# Patient Record
Sex: Female | Born: 1955 | Race: Black or African American | Hispanic: No | Marital: Married | Smoking: Never smoker
Health system: Southern US, Community
[De-identification: ages and names within clinical notes are randomized; demographics above are authoritative.]

## PROBLEM LIST (undated history)

## (undated) DIAGNOSIS — K219 Gastro-esophageal reflux disease without esophagitis: Secondary | ICD-10-CM

## (undated) DIAGNOSIS — R768 Other specified abnormal immunological findings in serum: Secondary | ICD-10-CM

## (undated) DIAGNOSIS — M1990 Unspecified osteoarthritis, unspecified site: Secondary | ICD-10-CM

## (undated) DIAGNOSIS — R7689 Other specified abnormal immunological findings in serum: Secondary | ICD-10-CM

## (undated) DIAGNOSIS — Z8659 Personal history of other mental and behavioral disorders: Secondary | ICD-10-CM

## (undated) DIAGNOSIS — G8929 Other chronic pain: Secondary | ICD-10-CM

## (undated) DIAGNOSIS — M797 Fibromyalgia: Secondary | ICD-10-CM

## (undated) DIAGNOSIS — Z86018 Personal history of other benign neoplasm: Secondary | ICD-10-CM

## (undated) DIAGNOSIS — R109 Unspecified abdominal pain: Secondary | ICD-10-CM

## (undated) DIAGNOSIS — R51 Headache: Secondary | ICD-10-CM

## (undated) DIAGNOSIS — F419 Anxiety disorder, unspecified: Secondary | ICD-10-CM

## (undated) DIAGNOSIS — F329 Major depressive disorder, single episode, unspecified: Secondary | ICD-10-CM

## (undated) DIAGNOSIS — R350 Frequency of micturition: Secondary | ICD-10-CM

## (undated) DIAGNOSIS — K589 Irritable bowel syndrome without diarrhea: Secondary | ICD-10-CM

## (undated) DIAGNOSIS — R0600 Dyspnea, unspecified: Secondary | ICD-10-CM

## (undated) DIAGNOSIS — K5790 Diverticulosis of intestine, part unspecified, without perforation or abscess without bleeding: Secondary | ICD-10-CM

## (undated) DIAGNOSIS — M791 Myalgia, unspecified site: Secondary | ICD-10-CM

## (undated) DIAGNOSIS — M545 Low back pain, unspecified: Secondary | ICD-10-CM

## (undated) DIAGNOSIS — Z973 Presence of spectacles and contact lenses: Secondary | ICD-10-CM

## (undated) DIAGNOSIS — R519 Headache, unspecified: Secondary | ICD-10-CM

## (undated) DIAGNOSIS — I10 Essential (primary) hypertension: Secondary | ICD-10-CM

## (undated) DIAGNOSIS — E785 Hyperlipidemia, unspecified: Secondary | ICD-10-CM

## (undated) DIAGNOSIS — M329 Systemic lupus erythematosus, unspecified: Secondary | ICD-10-CM

## (undated) DIAGNOSIS — R110 Nausea: Secondary | ICD-10-CM

## (undated) DIAGNOSIS — Z87898 Personal history of other specified conditions: Secondary | ICD-10-CM

## (undated) DIAGNOSIS — K759 Inflammatory liver disease, unspecified: Secondary | ICD-10-CM

## (undated) DIAGNOSIS — R319 Hematuria, unspecified: Secondary | ICD-10-CM

## (undated) DIAGNOSIS — G40909 Epilepsy, unspecified, not intractable, without status epilepticus: Secondary | ICD-10-CM

## (undated) DIAGNOSIS — E119 Type 2 diabetes mellitus without complications: Secondary | ICD-10-CM

## (undated) HISTORY — DX: Headache: R51

## (undated) HISTORY — DX: Headache, unspecified: R51.9

## (undated) HISTORY — PX: TUBAL LIGATION: SHX77

## (undated) HISTORY — PX: OTHER SURGICAL HISTORY: SHX169

---

## 1976-10-21 HISTORY — PX: CHOLECYSTECTOMY OPEN: SUR202

## 1986-10-21 HISTORY — PX: ABDOMINAL HYSTERECTOMY: SHX81

## 1998-05-04 ENCOUNTER — Emergency Department (HOSPITAL_COMMUNITY): Admission: EM | Admit: 1998-05-04 | Discharge: 1998-05-04 | Payer: Self-pay

## 2000-12-16 ENCOUNTER — Emergency Department (HOSPITAL_COMMUNITY): Admission: EM | Admit: 2000-12-16 | Discharge: 2000-12-16 | Payer: Self-pay | Admitting: Emergency Medicine

## 2001-06-17 ENCOUNTER — Ambulatory Visit (HOSPITAL_COMMUNITY): Admission: RE | Admit: 2001-06-17 | Discharge: 2001-06-17 | Payer: Self-pay | Admitting: Gastroenterology

## 2001-06-17 ENCOUNTER — Encounter (INDEPENDENT_AMBULATORY_CARE_PROVIDER_SITE_OTHER): Payer: Self-pay | Admitting: Specialist

## 2002-03-29 ENCOUNTER — Emergency Department (HOSPITAL_COMMUNITY): Admission: EM | Admit: 2002-03-29 | Discharge: 2002-03-29 | Payer: Self-pay | Admitting: Emergency Medicine

## 2002-03-29 ENCOUNTER — Encounter: Payer: Self-pay | Admitting: Emergency Medicine

## 2002-05-13 ENCOUNTER — Other Ambulatory Visit: Admission: RE | Admit: 2002-05-13 | Discharge: 2002-05-13 | Payer: Self-pay | Admitting: Family Medicine

## 2002-09-01 ENCOUNTER — Emergency Department (HOSPITAL_COMMUNITY): Admission: EM | Admit: 2002-09-01 | Discharge: 2002-09-01 | Payer: Self-pay | Admitting: Emergency Medicine

## 2002-09-01 ENCOUNTER — Encounter: Payer: Self-pay | Admitting: Emergency Medicine

## 2002-10-10 ENCOUNTER — Emergency Department (HOSPITAL_COMMUNITY): Admission: EM | Admit: 2002-10-10 | Discharge: 2002-10-10 | Payer: Self-pay | Admitting: Emergency Medicine

## 2002-11-12 ENCOUNTER — Emergency Department (HOSPITAL_COMMUNITY): Admission: EM | Admit: 2002-11-12 | Discharge: 2002-11-12 | Payer: Self-pay | Admitting: Emergency Medicine

## 2003-01-03 ENCOUNTER — Encounter: Payer: Self-pay | Admitting: Emergency Medicine

## 2003-01-03 ENCOUNTER — Emergency Department (HOSPITAL_COMMUNITY): Admission: EM | Admit: 2003-01-03 | Discharge: 2003-01-03 | Payer: Self-pay | Admitting: Emergency Medicine

## 2003-04-12 ENCOUNTER — Encounter: Admission: RE | Admit: 2003-04-12 | Discharge: 2003-04-12 | Payer: Self-pay | Admitting: Gastroenterology

## 2003-04-12 ENCOUNTER — Encounter: Payer: Self-pay | Admitting: Gastroenterology

## 2003-04-28 ENCOUNTER — Encounter: Payer: Self-pay | Admitting: Gastroenterology

## 2003-04-28 ENCOUNTER — Ambulatory Visit (HOSPITAL_COMMUNITY): Admission: RE | Admit: 2003-04-28 | Discharge: 2003-04-28 | Payer: Self-pay | Admitting: Gastroenterology

## 2003-04-29 ENCOUNTER — Emergency Department (HOSPITAL_COMMUNITY): Admission: EM | Admit: 2003-04-29 | Discharge: 2003-04-30 | Payer: Self-pay | Admitting: Emergency Medicine

## 2003-05-02 ENCOUNTER — Other Ambulatory Visit: Admission: RE | Admit: 2003-05-02 | Discharge: 2003-05-02 | Payer: Self-pay | Admitting: Obstetrics and Gynecology

## 2003-06-08 ENCOUNTER — Ambulatory Visit (HOSPITAL_COMMUNITY): Admission: RE | Admit: 2003-06-08 | Discharge: 2003-06-08 | Payer: Self-pay | Admitting: Obstetrics and Gynecology

## 2003-07-26 ENCOUNTER — Emergency Department (HOSPITAL_COMMUNITY): Admission: EM | Admit: 2003-07-26 | Discharge: 2003-07-27 | Payer: Self-pay | Admitting: Emergency Medicine

## 2003-07-26 ENCOUNTER — Encounter: Payer: Self-pay | Admitting: Emergency Medicine

## 2003-09-26 ENCOUNTER — Inpatient Hospital Stay (HOSPITAL_COMMUNITY): Admission: RE | Admit: 2003-09-26 | Discharge: 2003-09-29 | Payer: Self-pay | Admitting: Obstetrics and Gynecology

## 2003-09-26 ENCOUNTER — Encounter (INDEPENDENT_AMBULATORY_CARE_PROVIDER_SITE_OTHER): Payer: Self-pay | Admitting: *Deleted

## 2004-01-06 ENCOUNTER — Emergency Department (HOSPITAL_COMMUNITY): Admission: EM | Admit: 2004-01-06 | Discharge: 2004-01-06 | Payer: Self-pay | Admitting: Emergency Medicine

## 2004-04-22 ENCOUNTER — Emergency Department (HOSPITAL_COMMUNITY): Admission: EM | Admit: 2004-04-22 | Discharge: 2004-04-22 | Payer: Self-pay | Admitting: Emergency Medicine

## 2004-05-21 ENCOUNTER — Emergency Department (HOSPITAL_COMMUNITY): Admission: EM | Admit: 2004-05-21 | Discharge: 2004-05-21 | Payer: Self-pay | Admitting: Emergency Medicine

## 2004-05-28 ENCOUNTER — Other Ambulatory Visit: Admission: RE | Admit: 2004-05-28 | Discharge: 2004-05-28 | Payer: Self-pay | Admitting: Obstetrics and Gynecology

## 2004-07-16 ENCOUNTER — Emergency Department (HOSPITAL_COMMUNITY): Admission: EM | Admit: 2004-07-16 | Discharge: 2004-07-16 | Payer: Self-pay | Admitting: Emergency Medicine

## 2004-08-11 ENCOUNTER — Emergency Department (HOSPITAL_COMMUNITY): Admission: EM | Admit: 2004-08-11 | Discharge: 2004-08-11 | Payer: Self-pay | Admitting: Emergency Medicine

## 2004-10-12 ENCOUNTER — Emergency Department (HOSPITAL_COMMUNITY): Admission: EM | Admit: 2004-10-12 | Discharge: 2004-10-13 | Payer: Self-pay | Admitting: Emergency Medicine

## 2004-11-20 ENCOUNTER — Emergency Department (HOSPITAL_COMMUNITY): Admission: EM | Admit: 2004-11-20 | Discharge: 2004-11-20 | Payer: Self-pay | Admitting: Emergency Medicine

## 2005-03-06 ENCOUNTER — Emergency Department (HOSPITAL_COMMUNITY): Admission: EM | Admit: 2005-03-06 | Discharge: 2005-03-06 | Payer: Self-pay | Admitting: Emergency Medicine

## 2005-03-11 ENCOUNTER — Ambulatory Visit (HOSPITAL_COMMUNITY): Admission: RE | Admit: 2005-03-11 | Discharge: 2005-03-11 | Payer: Self-pay | Admitting: Gastroenterology

## 2005-03-15 ENCOUNTER — Encounter (INDEPENDENT_AMBULATORY_CARE_PROVIDER_SITE_OTHER): Payer: Self-pay | Admitting: Specialist

## 2005-03-15 ENCOUNTER — Ambulatory Visit (HOSPITAL_COMMUNITY): Admission: RE | Admit: 2005-03-15 | Discharge: 2005-03-15 | Payer: Self-pay | Admitting: Gastroenterology

## 2005-03-16 ENCOUNTER — Emergency Department (HOSPITAL_COMMUNITY): Admission: EM | Admit: 2005-03-16 | Discharge: 2005-03-16 | Payer: Self-pay | Admitting: Emergency Medicine

## 2005-04-19 ENCOUNTER — Emergency Department (HOSPITAL_COMMUNITY): Admission: EM | Admit: 2005-04-19 | Discharge: 2005-04-19 | Payer: Self-pay | Admitting: Emergency Medicine

## 2005-04-29 ENCOUNTER — Ambulatory Visit (HOSPITAL_COMMUNITY): Admission: RE | Admit: 2005-04-29 | Discharge: 2005-04-29 | Payer: Self-pay | Admitting: Orthopedic Surgery

## 2005-05-23 ENCOUNTER — Emergency Department (HOSPITAL_COMMUNITY): Admission: EM | Admit: 2005-05-23 | Discharge: 2005-05-23 | Payer: Self-pay | Admitting: Emergency Medicine

## 2005-06-06 ENCOUNTER — Encounter: Admission: RE | Admit: 2005-06-06 | Discharge: 2005-06-06 | Payer: Self-pay | Admitting: *Deleted

## 2005-07-12 ENCOUNTER — Ambulatory Visit (HOSPITAL_COMMUNITY): Admission: RE | Admit: 2005-07-12 | Discharge: 2005-07-12 | Payer: Self-pay | Admitting: *Deleted

## 2005-07-17 ENCOUNTER — Emergency Department (HOSPITAL_COMMUNITY): Admission: EM | Admit: 2005-07-17 | Discharge: 2005-07-17 | Payer: Self-pay | Admitting: Emergency Medicine

## 2005-08-30 ENCOUNTER — Other Ambulatory Visit: Admission: RE | Admit: 2005-08-30 | Discharge: 2005-08-30 | Payer: Self-pay | Admitting: Obstetrics and Gynecology

## 2005-09-04 ENCOUNTER — Emergency Department (HOSPITAL_COMMUNITY): Admission: EM | Admit: 2005-09-04 | Discharge: 2005-09-04 | Payer: Self-pay | Admitting: Emergency Medicine

## 2005-12-30 ENCOUNTER — Emergency Department (HOSPITAL_COMMUNITY): Admission: EM | Admit: 2005-12-30 | Discharge: 2005-12-30 | Payer: Self-pay | Admitting: *Deleted

## 2006-09-13 ENCOUNTER — Emergency Department (HOSPITAL_COMMUNITY): Admission: EM | Admit: 2006-09-13 | Discharge: 2006-09-13 | Payer: Self-pay | Admitting: Emergency Medicine

## 2006-09-22 ENCOUNTER — Emergency Department (HOSPITAL_COMMUNITY): Admission: EM | Admit: 2006-09-22 | Discharge: 2006-09-22 | Payer: Self-pay | Admitting: Emergency Medicine

## 2006-11-25 ENCOUNTER — Emergency Department (HOSPITAL_COMMUNITY): Admission: EM | Admit: 2006-11-25 | Discharge: 2006-11-25 | Payer: Self-pay | Admitting: Emergency Medicine

## 2007-02-11 ENCOUNTER — Emergency Department (HOSPITAL_COMMUNITY): Admission: EM | Admit: 2007-02-11 | Discharge: 2007-02-12 | Payer: Self-pay | Admitting: Emergency Medicine

## 2007-04-21 ENCOUNTER — Emergency Department (HOSPITAL_COMMUNITY): Admission: EM | Admit: 2007-04-21 | Discharge: 2007-04-21 | Payer: Self-pay | Admitting: Emergency Medicine

## 2007-08-03 ENCOUNTER — Emergency Department (HOSPITAL_COMMUNITY): Admission: EM | Admit: 2007-08-03 | Discharge: 2007-08-03 | Payer: Self-pay | Admitting: Emergency Medicine

## 2007-10-22 ENCOUNTER — Emergency Department (HOSPITAL_COMMUNITY): Admission: EM | Admit: 2007-10-22 | Discharge: 2007-10-22 | Payer: Self-pay | Admitting: Emergency Medicine

## 2007-12-04 ENCOUNTER — Emergency Department (HOSPITAL_COMMUNITY): Admission: EM | Admit: 2007-12-04 | Discharge: 2007-12-04 | Payer: Self-pay | Admitting: Emergency Medicine

## 2008-04-14 ENCOUNTER — Emergency Department (HOSPITAL_COMMUNITY): Admission: EM | Admit: 2008-04-14 | Discharge: 2008-04-14 | Payer: Self-pay | Admitting: Family Medicine

## 2008-05-23 ENCOUNTER — Emergency Department (HOSPITAL_COMMUNITY): Admission: EM | Admit: 2008-05-23 | Discharge: 2008-05-23 | Payer: Self-pay | Admitting: Emergency Medicine

## 2008-10-30 ENCOUNTER — Emergency Department (HOSPITAL_COMMUNITY): Admission: EM | Admit: 2008-10-30 | Discharge: 2008-10-30 | Payer: Self-pay | Admitting: Emergency Medicine

## 2009-01-08 ENCOUNTER — Emergency Department (HOSPITAL_COMMUNITY): Admission: EM | Admit: 2009-01-08 | Discharge: 2009-01-09 | Payer: Self-pay | Admitting: Emergency Medicine

## 2009-04-27 ENCOUNTER — Emergency Department (HOSPITAL_COMMUNITY): Admission: EM | Admit: 2009-04-27 | Discharge: 2009-04-27 | Payer: Self-pay | Admitting: Emergency Medicine

## 2009-05-04 ENCOUNTER — Inpatient Hospital Stay (HOSPITAL_COMMUNITY): Admission: AD | Admit: 2009-05-04 | Discharge: 2009-05-09 | Payer: Self-pay | Admitting: Gastroenterology

## 2009-05-26 ENCOUNTER — Emergency Department (HOSPITAL_COMMUNITY): Admission: EM | Admit: 2009-05-26 | Discharge: 2009-05-26 | Payer: Self-pay | Admitting: Emergency Medicine

## 2009-09-25 ENCOUNTER — Emergency Department (HOSPITAL_COMMUNITY): Admission: EM | Admit: 2009-09-25 | Discharge: 2009-09-25 | Payer: Self-pay | Admitting: Emergency Medicine

## 2009-12-25 ENCOUNTER — Emergency Department (HOSPITAL_COMMUNITY): Admission: EM | Admit: 2009-12-25 | Discharge: 2009-12-25 | Payer: Self-pay | Admitting: Emergency Medicine

## 2010-08-15 ENCOUNTER — Emergency Department (HOSPITAL_COMMUNITY): Admission: EM | Admit: 2010-08-15 | Discharge: 2010-08-15 | Payer: Self-pay | Admitting: Emergency Medicine

## 2010-08-20 ENCOUNTER — Emergency Department (HOSPITAL_COMMUNITY): Admission: EM | Admit: 2010-08-20 | Discharge: 2010-08-21 | Payer: Self-pay | Admitting: Emergency Medicine

## 2010-11-10 ENCOUNTER — Encounter: Payer: Self-pay | Admitting: Orthopedic Surgery

## 2010-11-12 ENCOUNTER — Encounter: Payer: Self-pay | Admitting: General Surgery

## 2010-11-24 ENCOUNTER — Emergency Department (HOSPITAL_COMMUNITY)
Admission: EM | Admit: 2010-11-24 | Discharge: 2010-11-24 | Disposition: A | Discharge status: Home / Self Care | Payer: Medicare Other | Attending: Emergency Medicine | Admitting: Emergency Medicine

## 2010-11-24 ENCOUNTER — Emergency Department (HOSPITAL_COMMUNITY): Payer: Medicare Other

## 2010-11-24 DIAGNOSIS — F341 Dysthymic disorder: Secondary | ICD-10-CM | POA: Insufficient documentation

## 2010-11-24 DIAGNOSIS — G40909 Epilepsy, unspecified, not intractable, without status epilepticus: Secondary | ICD-10-CM | POA: Insufficient documentation

## 2010-11-24 DIAGNOSIS — F29 Unspecified psychosis not due to a substance or known physiological condition: Secondary | ICD-10-CM | POA: Insufficient documentation

## 2010-11-24 DIAGNOSIS — I10 Essential (primary) hypertension: Secondary | ICD-10-CM | POA: Insufficient documentation

## 2010-11-24 DIAGNOSIS — Z79899 Other long term (current) drug therapy: Secondary | ICD-10-CM | POA: Insufficient documentation

## 2010-11-24 LAB — URINALYSIS, ROUTINE W REFLEX MICROSCOPIC
Hgb urine dipstick: NEGATIVE
Nitrite: NEGATIVE
Specific Gravity, Urine: 1.013 (ref 1.005–1.030)
Urobilinogen, UA: 0.2 mg/dL (ref 0.0–1.0)

## 2010-11-24 LAB — CBC
HCT: 37.1 % (ref 36.0–46.0)
Hemoglobin: 12.1 g/dL (ref 12.0–15.0)
RBC: 4.74 MIL/uL (ref 3.87–5.11)
WBC: 6.9 10*3/uL (ref 4.0–10.5)

## 2010-11-24 LAB — POCT I-STAT, CHEM 8
Chloride: 100 mEq/L (ref 96–112)
Creatinine, Ser: 0.8 mg/dL (ref 0.4–1.2)
Creatinine, Ser: 0.8 mg/dL (ref 0.4–1.2)
Glucose, Bld: 80 mg/dL (ref 70–99)
HCT: 39 % (ref 36.0–46.0)
Hemoglobin: 13.3 g/dL (ref 12.0–15.0)
Potassium: 3.2 mEq/L — ABNORMAL LOW (ref 3.5–5.1)
Potassium: 3.1 mEq/L — ABNORMAL LOW (ref 3.5–5.1)
Sodium: 136 mEq/L (ref 135–145)
Sodium: 138 mEq/L (ref 135–145)

## 2010-11-24 LAB — DIFFERENTIAL
Basophils Absolute: 0 10*3/uL (ref 0.0–0.1)
Basophils Relative: 0 % (ref 0–1)
Lymphocytes Relative: 45 % (ref 12–46)
Neutro Abs: 3 10*3/uL (ref 1.7–7.7)
Neutrophils Relative %: 44 % (ref 43–77)

## 2010-11-26 LAB — URINE CULTURE
Culture  Setup Time: 201202050131
Culture: NO GROWTH

## 2011-01-02 LAB — CBC
HCT: 41.5 % (ref 36.0–46.0)
Hemoglobin: 13.1 g/dL (ref 12.0–15.0)
MCHC: 32.9 g/dL (ref 30.0–36.0)
MCHC: 32 g/dL (ref 30.0–36.0)
Platelets: 284 10*3/uL (ref 150–400)
RDW: 16.4 % — ABNORMAL HIGH (ref 11.5–15.5)
RDW: 15.5 % (ref 11.5–15.5)
WBC: 5.1 10*3/uL (ref 4.0–10.5)

## 2011-01-02 LAB — URINALYSIS, ROUTINE W REFLEX MICROSCOPIC
Bilirubin Urine: NEGATIVE
Hgb urine dipstick: NEGATIVE
Ketones, ur: NEGATIVE mg/dL
Nitrite: NEGATIVE
Nitrite: NEGATIVE
Protein, ur: NEGATIVE mg/dL
Specific Gravity, Urine: 1.021 (ref 1.005–1.030)
Urobilinogen, UA: 0.2 mg/dL (ref 0.0–1.0)
Urobilinogen, UA: 0.2 mg/dL (ref 0.0–1.0)
pH: 7 (ref 5.0–8.0)
pH: 6 (ref 5.0–8.0)

## 2011-01-02 LAB — COMPREHENSIVE METABOLIC PANEL
ALT: 129 U/L — ABNORMAL HIGH (ref 0–35)
ALT: 25 U/L (ref 0–35)
AST: 60 U/L — ABNORMAL HIGH (ref 0–37)
AST: 23 U/L (ref 0–37)
Albumin: 3.6 g/dL (ref 3.5–5.2)
Albumin: 4 g/dL (ref 3.5–5.2)
Alkaline Phosphatase: 47 U/L (ref 39–117)
Calcium: 9.2 mg/dL (ref 8.4–10.5)
Calcium: 8.9 mg/dL (ref 8.4–10.5)
Creatinine, Ser: 0.68 mg/dL (ref 0.4–1.2)
GFR calc Af Amer: >60 mL/min (ref >60)
GFR calc Af Amer: >60 mL/min (ref >60)
Glucose, Bld: 79 mg/dL (ref 70–99)
Potassium: 3.4 mEq/L — ABNORMAL LOW (ref 3.5–5.1)
Sodium: 139 mEq/L (ref 135–145)
Sodium: 138 mEq/L (ref 135–145)
Total Protein: 7.7 g/dL (ref 6.0–8.3)
Total Protein: 8.5 g/dL — ABNORMAL HIGH (ref 6.0–8.3)

## 2011-01-02 LAB — DIFFERENTIAL
Basophils Relative: 0 % (ref 0–1)
Eosinophils Absolute: 0 10*3/uL (ref 0.0–0.7)
Eosinophils Absolute: 0 10*3/uL (ref 0.0–0.7)
Eosinophils Relative: 0 % (ref 0–5)
Eosinophils Relative: 0 % (ref 0–5)
Lymphocytes Relative: 44 % (ref 12–46)
Lymphs Abs: 2.3 10*3/uL (ref 0.7–4.0)
Lymphs Abs: 2.6 10*3/uL (ref 0.7–4.0)
Monocytes Absolute: 0.5 10*3/uL (ref 0.1–1.0)
Monocytes Relative: 15 % — ABNORMAL HIGH (ref 3–12)
Monocytes Relative: 10 % (ref 3–12)

## 2011-01-02 LAB — HEMOCCULT GUIAC POC 1CARD (OFFICE): Fecal Occult Bld: POSITIVE

## 2011-01-02 LAB — POCT PREGNANCY, URINE: Preg Test, Ur: NEGATIVE

## 2011-01-11 LAB — URINALYSIS, ROUTINE W REFLEX MICROSCOPIC
Bilirubin Urine: NEGATIVE
Glucose, UA: NEGATIVE mg/dL
Hgb urine dipstick: NEGATIVE
Ketones, ur: NEGATIVE mg/dL
Protein, ur: NEGATIVE mg/dL
pH: 6.5 (ref 5.0–8.0)

## 2011-01-11 LAB — CBC
Hemoglobin: 12.2 g/dL (ref 12.0–15.0)
MCHC: 31.3 g/dL (ref 30.0–36.0)
RBC: 4.74 MIL/uL (ref 3.87–5.11)

## 2011-01-11 LAB — URINE CULTURE
Colony Count: NO GROWTH
Culture: NO GROWTH

## 2011-01-11 LAB — COMPREHENSIVE METABOLIC PANEL
ALT: 14 U/L (ref 0–35)
AST: 17 U/L (ref 0–37)
Alkaline Phosphatase: 47 U/L (ref 39–117)
CO2: 27 mEq/L (ref 19–32)
Calcium: 9.3 mg/dL (ref 8.4–10.5)
GFR calc Af Amer: >60 mL/min (ref >60)
GFR calc non Af Amer: >60 mL/min (ref >60)
Potassium: 3.7 mEq/L (ref 3.5–5.1)
Sodium: 134 mEq/L — ABNORMAL LOW (ref 135–145)
Total Protein: 7.8 g/dL (ref 6.0–8.3)

## 2011-01-11 LAB — DIFFERENTIAL
Basophils Relative: 1 % (ref 0–1)
Eosinophils Absolute: 0 10*3/uL (ref 0.0–0.7)
Eosinophils Relative: 1 % (ref 0–5)
Lymphs Abs: 2.7 10*3/uL (ref 0.7–4.0)
Monocytes Relative: 8 % (ref 3–12)

## 2011-01-22 LAB — URINALYSIS, ROUTINE W REFLEX MICROSCOPIC
Hgb urine dipstick: NEGATIVE
Nitrite: NEGATIVE
Protein, ur: NEGATIVE mg/dL
Urobilinogen, UA: 0.2 mg/dL (ref 0.0–1.0)

## 2011-01-22 LAB — CBC
MCHC: 32.3 g/dL (ref 30.0–36.0)
MCV: 80.7 fL (ref 78.0–100.0)
Platelets: 333 10*3/uL (ref 150–400)
RDW: 15.3 % (ref 11.5–15.5)

## 2011-01-22 LAB — DIFFERENTIAL
Basophils Absolute: 0 10*3/uL (ref 0.0–0.1)
Basophils Relative: 1 % (ref 0–1)
Eosinophils Absolute: 0 10*3/uL (ref 0.0–0.7)
Neutrophils Relative %: 46 % (ref 43–77)

## 2011-01-22 LAB — POCT I-STAT, CHEM 8
Creatinine, Ser: 0.6 mg/dL (ref 0.4–1.2)
Hemoglobin: 14.6 g/dL (ref 12.0–15.0)
Sodium: 133 mEq/L — ABNORMAL LOW (ref 135–145)
TCO2: 32 mmol/L (ref 0–100)

## 2011-01-22 LAB — POTASSIUM: Potassium: 3 mEq/L — ABNORMAL LOW (ref 3.5–5.1)

## 2011-01-23 ENCOUNTER — Emergency Department (HOSPITAL_COMMUNITY)
Admission: EM | Admit: 2011-01-23 | Discharge: 2011-01-23 | Disposition: A | Discharge status: Home / Self Care | Payer: Medicare Other | Attending: Emergency Medicine | Admitting: Emergency Medicine

## 2011-01-23 ENCOUNTER — Emergency Department (HOSPITAL_COMMUNITY): Payer: Medicare Other

## 2011-01-23 DIAGNOSIS — R0989 Other specified symptoms and signs involving the circulatory and respiratory systems: Secondary | ICD-10-CM | POA: Insufficient documentation

## 2011-01-23 DIAGNOSIS — R0609 Other forms of dyspnea: Secondary | ICD-10-CM | POA: Insufficient documentation

## 2011-01-23 DIAGNOSIS — J40 Bronchitis, not specified as acute or chronic: Secondary | ICD-10-CM | POA: Insufficient documentation

## 2011-01-23 DIAGNOSIS — G43909 Migraine, unspecified, not intractable, without status migrainosus: Secondary | ICD-10-CM | POA: Insufficient documentation

## 2011-01-23 DIAGNOSIS — R05 Cough: Secondary | ICD-10-CM | POA: Insufficient documentation

## 2011-01-23 DIAGNOSIS — G40909 Epilepsy, unspecified, not intractable, without status epilepticus: Secondary | ICD-10-CM | POA: Insufficient documentation

## 2011-01-23 DIAGNOSIS — I10 Essential (primary) hypertension: Secondary | ICD-10-CM | POA: Insufficient documentation

## 2011-01-23 DIAGNOSIS — R079 Chest pain, unspecified: Secondary | ICD-10-CM | POA: Insufficient documentation

## 2011-01-23 DIAGNOSIS — R059 Cough, unspecified: Secondary | ICD-10-CM | POA: Insufficient documentation

## 2011-01-23 LAB — DIFFERENTIAL
Basophils Absolute: 0 10*3/uL (ref 0.0–0.1)
Basophils Relative: 0 % (ref 0–1)
Eosinophils Absolute: 0 10*3/uL (ref 0.0–0.7)
Eosinophils Relative: 0 % (ref 0–5)
Monocytes Absolute: 0.3 10*3/uL (ref 0.1–1.0)

## 2011-01-23 LAB — CBC
HCT: 40.1 % (ref 36.0–46.0)
MCHC: 31.4 g/dL (ref 30.0–36.0)
Platelets: 282 10*3/uL (ref 150–400)
RDW: 15.5 % (ref 11.5–15.5)
WBC: 4.6 10*3/uL (ref 4.0–10.5)

## 2011-01-24 LAB — POCT I-STAT, CHEM 8
BUN: 9 mg/dL (ref 6–23)
Calcium, Ion: 0.77 mmol/L — ABNORMAL LOW (ref 1.12–1.32)
TCO2: 11 mmol/L (ref 0–100)

## 2011-01-24 LAB — POCT CARDIAC MARKERS: Myoglobin, poc: 72.5 ng/mL (ref 12–200)

## 2011-01-27 LAB — COMPREHENSIVE METABOLIC PANEL
ALT: 61 U/L — ABNORMAL HIGH (ref 0–35)
BUN: 12 mg/dL (ref 6–23)
Calcium: 9.2 mg/dL (ref 8.4–10.5)
Glucose, Bld: 85 mg/dL (ref 70–99)
Sodium: 139 mEq/L (ref 135–145)
Total Protein: 8.3 g/dL (ref 6.0–8.3)

## 2011-01-27 LAB — CBC
Hemoglobin: 11.3 g/dL — ABNORMAL LOW (ref 12.0–15.0)
Hemoglobin: 11.8 g/dL — ABNORMAL LOW (ref 12.0–15.0)
Hemoglobin: 13.2 g/dL (ref 12.0–15.0)
MCHC: 32.4 g/dL (ref 30.0–36.0)
MCHC: 32.5 g/dL (ref 30.0–36.0)
MCHC: 33.2 g/dL (ref 30.0–36.0)
MCHC: 33.3 g/dL (ref 30.0–36.0)
MCV: 80.2 fL (ref 78.0–100.0)
MCV: 80.6 fL (ref 78.0–100.0)
MCV: 80.8 fL (ref 78.0–100.0)
Platelets: 316 10*3/uL (ref 150–400)
RBC: 4.32 MIL/uL (ref 3.87–5.11)
RBC: 4.42 MIL/uL (ref 3.87–5.11)
RDW: 16.6 % — ABNORMAL HIGH (ref 11.5–15.5)
RDW: 16.7 % — ABNORMAL HIGH (ref 11.5–15.5)
RDW: 16.8 % — ABNORMAL HIGH (ref 11.5–15.5)
RDW: 16.4 % — ABNORMAL HIGH (ref 11.5–15.5)

## 2011-01-27 LAB — BASIC METABOLIC PANEL
BUN: 8 mg/dL (ref 6–23)
CO2: 25 mEq/L (ref 19–32)
CO2: 28 mEq/L (ref 19–32)
Calcium: 8.1 mg/dL — ABNORMAL LOW (ref 8.4–10.5)
Chloride: 106 mEq/L (ref 96–112)
Chloride: 107 mEq/L (ref 96–112)
Creatinine, Ser: 0.49 mg/dL (ref 0.4–1.2)
Creatinine, Ser: 0.57 mg/dL (ref 0.4–1.2)
GFR calc Af Amer: >60 mL/min (ref >60)
Glucose, Bld: 110 mg/dL — ABNORMAL HIGH (ref 70–99)
Glucose, Bld: 96 mg/dL (ref 70–99)

## 2011-01-27 LAB — URINALYSIS, ROUTINE W REFLEX MICROSCOPIC
Glucose, UA: NEGATIVE mg/dL
Glucose, UA: NEGATIVE mg/dL
Hgb urine dipstick: NEGATIVE
Ketones, ur: NEGATIVE mg/dL
Protein, ur: NEGATIVE mg/dL
Protein, ur: NEGATIVE mg/dL
Urobilinogen, UA: 0.2 mg/dL (ref 0.0–1.0)
Urobilinogen, UA: 0.2 mg/dL (ref 0.0–1.0)

## 2011-01-31 LAB — CBC
HCT: 39 % (ref 36.0–46.0)
Hemoglobin: 12.5 g/dL (ref 12.0–15.0)
MCHC: 32.1 g/dL (ref 30.0–36.0)
MCV: 80.7 fL (ref 78.0–100.0)
RBC: 4.82 MIL/uL (ref 3.87–5.11)

## 2011-01-31 LAB — DIFFERENTIAL
Basophils Relative: 1 % (ref 0–1)
Eosinophils Absolute: 0 10*3/uL (ref 0.0–0.7)
Monocytes Absolute: 0.6 10*3/uL (ref 0.1–1.0)
Monocytes Relative: 11 % (ref 3–12)

## 2011-01-31 LAB — POCT I-STAT, CHEM 8
BUN: 18 mg/dL (ref 6–23)
Creatinine, Ser: 0.8 mg/dL (ref 0.4–1.2)
Potassium: 3.2 mEq/L — ABNORMAL LOW (ref 3.5–5.1)
Sodium: 139 mEq/L (ref 135–145)

## 2011-03-05 NOTE — H&P (Signed)
NAMEMarland Kitchen  Erin Wolf, Erin Wolf              ACCOUNT NO.:  [id number]   MEDICAL RECORD NO.:  [id number]          PATIENT TYPE:  INP   LOCATION:  3306                         FACILITY:  [facility]   PHYSICIAN:  Jordan Hawks. [name], MD    DATE OF BIRTH:  [birth year]   DATE OF ADMISSION:  05/04/2009  DATE OF DISCHARGE:                              HISTORY & PHYSICAL   REASON FOR ADMISSION:  Right lower quadrant pain and history of  ulcerative colitis.   HISTORY OF PRESENT ILLNESS:  This is a 55 year old female with a past  medical history of left-sided ulcerative colitis, pelvic pain status  post hysterectomy, bilateral salpingo-oophorectomy, and status post  cholecystectomy was admitted to the hospital with complaints of  abdominal pain.  The patient states that she was in the emergency room  on April 23, 2009, where the pain had started at that time.  Pain is sharp  and constant in the right lower quadrant and does not seem to be  alleviated by any measures.  Because she was in a such terrible pain,  she presented to the ER during the July 4th week to seek treatment.  She  was only treated symptomatically at that time and no blood work or  imaging scans were performed.  She subsequently started to have nausea  and then vomiting as well as diarrhea with mucoid stools and streaks of  blood in the stool.  The patient denies having any fever and no known  sick contacts.  She had her last colonoscopy on February 17, 2008, with Dr.  Loreta Ave, and at that time, she was noted to have diverticulosis but no  evidence of any active colitis.  Because of her worsening symptoms, she  was subsequently admitted to the office for further evaluation and  treatment.  On a daily basis since the onset of her diarrhea, she has  approximately 5-6 watery bowel movements per day.  Fourteen years ago,  she was diagnosed with ulcerative colitis and she has had a colonoscopy  performed by Dr. Madilyn Fireman in 2002 and subsequently in 2006.  She had  a  repeat examination by Dr. Loreta Ave.  Both those examinations did reveal she  had active colitis in the rectum and also the sigmoid region.  During  Dr. Madilyn Fireman' report in 2002, there was no evidence of a skip lesion.  Suppositories seem to have helped control her symptoms, but she has not  been on any medications for quite some time.  At one point, she did not  have any insurance and could not afford the medications, but she also  apparently had no symptoms and decided that she did not need any further  treatment during the interval time.   PAST MEDICAL HISTORY:  As stated above.   PAST SURGICAL HISTORY:  As stated above.   Family history is significant for colon cancer.   Review of systems is as stated above in the history present illness,  otherwise, negative.   HOME MEDICATIONS:  None.   ALLERGIES:  ASPIRIN, VICODIN, and DARVOCET.   PHYSICAL EXAMINATION:  VITAL SIGNS:  Stable.  GENERAL:  The patient is uncomfortable appearing.  HEENT:  Normocephalic, atraumatic.  Extraocular muscles intact.  NECK:  Supple.  No lymphadenopathy.  LUNGS:  Clear to auscultation bilaterally.  CARDIOVASCULAR:  Regular rate and rhythm.  ABDOMEN:  Obese, soft, tender in the right lower quadrant.  No rebound  or rigidity.  Positive bowel sounds.  EXTREMITIES:  No clubbing, cyanosis, or edema.  RECTAL:  Heme-negative   Laboratory values are pending at this time.   IMPRESSION:  1. Right lower quadrant pain.  2. History of left-sided ulcerative colitis.  3. Nausea and vomiting.  4. Diarrhea.  In light of the patient's symptoms, further evaluation      is required.  A CT scan is pending at this time in order to help      define the severe right lower quadrant pain.  She also has some      complaints of some back pain, rectal pain.  I am uncertain if she      is having a flare of her disease at this time, and a colonoscopy      will also be scheduled for the patient.  Plan at this time is for a       CT scan of the abdomen and pelvis, and pending the results, a      colonoscopy will be performed.  5. Pain control for the patient.      Jordan Hawks [name], MD  Electronically Signed     PDH/MEDQ  D:  05/04/2009  T:  05/05/2009  Job:  [id number]

## 2011-03-05 NOTE — Discharge Summary (Signed)
NAMEMarland Wolf  EMANII, BUGBEE              ACCOUNT NO.:  [id number]   MEDICAL RECORD NO.:  [id number]          PATIENT TYPE:  INP   LOCATION:  5158                         FACILITY:  [facility]   PHYSICIAN:  Jordan Hawks. [name], MD    DATE OF BIRTH:  1956/01/19   DATE OF ADMISSION:  05/04/2009  DATE OF DISCHARGE:  05/09/2009                               DISCHARGE SUMMARY   DISCHARGE DIAGNOSES:  Right lower quadrant pain of unclear etiology.   HISTORY AND PHYSICAL:  Please see original H and P for full details.   HOSPITAL COURSE:  The patient was admitted to the hospital with  significant amount of pain and some minor diarrheal type of complaint.  A CT scan was performed and was noted to have some possible thickening  without any pericolonic inflammation on the CT scan.  Subsequently, she  underwent a colonoscopy on the following day and the examination was  essentially negative in regard to any inflammation in the colon,  ulcerations, or vascular abnormalities.  The patient was found to have  some diverticuli in his sigmoid colon, otherwise no other abnormalities.  The patient was subsequently started on hyoscyamine with 0.125 mg one  p.o. q.i.d. p.r.n., and she did not have any significant change in  regards to the pain with the medications.  Overall, it seemed that she  had improved somewhat with tincture of time.  After further discussion,  the patient states that she is still grieving the loss of her mother and  that she was quite tearful in regards to that situation and it has been  over a year since the passing of her mother.  She denies any other  stresses in her life at this time.  After that discussion, it was  possible that she may be having issues from a functional source.   Plan is to discharge the patient home and she did not have any further  issues with vomiting last night and her pain is relatively improved at  this time with the tincture of time.  She will follow up with Dr. Loreta Ave  in 1 week.      Jordan Hawks [name], MD  Electronically Signed     PDH/MEDQ  D:  05/09/2009  T:  05/09/2009  Job:  [id number]

## 2011-03-08 NOTE — Op Note (Signed)
NAME:  CAELEY, DOHRMANN NO.:  [id number]   MEDICAL RECORD NO.:  [id number]          PATIENT TYPE:  EMS   LOCATION:  ED                           FACILITY:  [facility]   PHYSICIAN:  Anselmo Rod, M.D.  DATE OF BIRTH:  May 08, 1956   DATE OF PROCEDURE:  03/20/2005  DATE OF DISCHARGE:  03/16/2005                                 OPERATIVE REPORT   PROCEDURE PERFORMED:  Colonoscopy with multiple cold biopsies.   ENDOSCOPIST:  Charna Elizabeth, M.D.   INSTRUMENT USED:  Olympus video colonoscope.   INDICATIONS FOR PROCEDURE:  A 55 year old African-American female diagnosed  with ulcerative colitis four years ago undergoing a repeat colonoscopy for  rectal bleeding, change in bowel habits, question ulcerative colitis flare.   PREPROCEDURE PREPARATION:  Informed consent was procured from the patient.  The patient was fasted for eight hours prior to the procedure and prepped  with a bottle of magnesium citrate and a gallon of GoLYTELY the night prior  to the procedure.  She had attempted colonoscopy a week prior to this but  there was a large amount of stool in the colon.  Therefore, she was advised  to stay on a liquid diet for two days prior to the procedure.   PREPROCEDURE PHYSICAL:  The patient had stable vital signs.  Neck supple.  Chest clear to auscultation.  S1 and S2 regular.  Abdomen soft with normal  bowel sounds.   DESCRIPTION OF PROCEDURE:  The patient was placed in left lateral decubitus  position and sedated with 100 mg of Demerol and 12.5 mg of Versed in slow  incremental doses.  Once the patient was adequately sedated and maintained  on low flow oxygen and continuous cardiac monitoring, the Olympus video  colonoscope was advanced from the rectum to the cecum.  The appendicular  orifice and ileocecal valve were clearly visualized and photographed.  The  terminal ileum appeared healthy and without lesions.  Inflammatory type  changes were noted from 10 to 30 cm.  Biopsies were done to confirm the  diagnosis of ulcerative colitis.  The rest of the colonic mucosa appeared  healthy.  Retroflexion in the rectum revealed no abnormalities.  The patient  tolerated the procedure well without complication.   IMPRESSION:  Inflammatory changes consistent with ulcerative colitis were  seen from 10 to 30 cm.  There was continuous disease in this area with no  evidence of skip lesions.  The mucosa was very friable with significant  inflammation.  The rest of the exam was unremarkable.  0 to 9 cm appeared  normal.  Biopsies done.   RECOMMENDATIONS:  1. Prednisone taper starting at 40 mg per day for a week, then tapering by      10 mg every week until she reaches a dose of 5 mg per day has been      advised.  2. Asacol 400 mg two tablets three times a day has been advised.  She is      to follow up in the office in the next two weeks or earlier if need be.  All nonsteroidals are to be avoided.        JNM/MEDQ  D:  03/20/2005  T:  03/20/2005  Job:  [id number]   cc:   Melida Quitter, M.D.  [address]  [place]  [place] [address]  Fax: [phone]

## 2011-03-08 NOTE — Op Note (Signed)
NAME:  Erin Wolf, Erin Wolf                        ACCOUNT NO.:  [id number]   MEDICAL RECORD NO.:  [id number]                   PATIENT TYPE:  INP   LOCATION:  9305                                 FACILITY:  [facility]   PHYSICIAN:  Duke Salvia. Marcelle Overlie, M.D.            DATE OF BIRTH:  19-May-1956   DATE OF PROCEDURE:  09/26/2003  DATE OF DISCHARGE:                                 OPERATIVE REPORT   PREOPERATIVE DIAGNOSES:  1. Chronic pelvic pain.  2. Adnexal adhesions.  3. Dyspareunia.   POSTOPERATIVE DIAGNOSES:  1. Chronic pelvic pain.  2. Adnexal adhesions.  3. Dyspareunia.  4. 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   PROCEDURE:  Laparotomy with bilateral salpingo-oophorectomy.   SPECIMENS REMOVED:  1. Bilateral tubes and ovaries.  2. Bladder flap peritoneum, possible endometriosis.   SURGEON:  Duke Salvia. Marcelle Overlie, M.D.   ASSISTANT:  Raynald Kemp, M.D.   ANESTHESIA:  General endotracheal.   COMPLICATIONS:  None.   DRAINS:  Foley catheter.   ESTIMATED BLOOD LOSS:  150.   PROCEDURE AND FINDINGS:  The patient was taken to the operating room.  After  an adequate level of general endotracheal anesthesia was attained, the  patient supine, the abdomen was prepped and draped in the usual manner for  sterile abdominal procedures, a Foley catheter positioned draining clear  urine.  A transverse Pfannenstiel incision made through the old scar,  carried down to the fascia, which was incised and extended transversely.  Rectus muscles were divided in the midline, peritoneum entered superiorly  without incident and extended vertically.  The O'Connor-O'Sullivan retractor  was positioned.  The upper abdomen was explored and was unremarkable, bowels  packed out of the field.  Pelvic findings as follows:   The left ovary was densely adherent to the left pelvic sidewall.  There were  less prominent adhesions on the right ovary, but it was also stuck to the  pelvic sidewall.  There was an area of  some scarring along the bladder flap  that appeared to have a small nodule suspicious for endometriosis that was  also detected.   Starting on the left the left ovary was grasped with a Babcock clamp and  with sharp and blunt dissection, the ovary was freed up from its attachments  to the colon and the pelvic sidewall with careful dissection.  Once this was  completed, the left round ligament was clamped and divided, suture ligated  with 0 Dexon.  The retroperitoneal space on that side was developed.  The  course of the ureter was well below.  The left IP ligament was isolated,  clamped, divided, first free tied, followed by a suture ligature of 0 Dexon  after assuring that the ureter was well below.  The remainder of the ovary  was carefully dissected free, one remaining pedicle was clamped, divided,  and free tied with 0 Dexon.  This area was irrigated and noted to be  hemostatic.  The course of the ureter was re-palpated and was below the  operative site.  The serosa along the colon was carefully inspected.  There  was no evidence of any mucosal injury.  The serosa was reapproximated for a  distance of approximately 2.5 cm.  Excellent hemostasis on that side.  Attention was directed to the right side.  The right ovary was grasped with  Babcock clamp.  The adhesions were free up with sharp and blunt dissection,  the right round ligament was clamped, divided, and suture ligated with 0  Dexon, the retroperitoneal space on that side was developed.  Again the  course of the ureter was well below.  The right IP ligament was isolated,  clamped, divided, first free-tied, followed by suture ligature of 0 Dexon  after assuring that the course of the ureter was well below.  The remainder  of the ovary was freed up with sharp and blunt dissection.  One remaining  pedicle was clamped, divided, and free-tied with 0 Dexon.  Both operative  sites were then irrigated and noted to be hemostatic.  A  nodule on the  bladder flap was grasped with a Babcock clamp superficially and excised with  Metzenbaum scissors and sent for biopsy, suspicious for endometriosis.  This  area was superficial and was reapproximated with several interrupted 3-0  Dexon sutures for hemostasis.  Interceed was placed across this area prior  to closure.  Prior to closure, sponge, needle, and instrument counts were  reported as correct x2.  The peritoneum closed with a running 2-0 Dexon  suture, as were the rectus muscles in the midline.  Local anesthetic pain  pump catheters were then placed, one subfascial and one subcutaneous, for  postoperative pain relief.  The fascia was then closed from laterally to  midline on either side with a 0 PDS suture.  Subcutaneous fat was  hemostatic.  Clips and Steri-Strips used on the skin.  The pain relief pump  was bolus ed, and started on the pain pump per protocol.  She went to the  recovery room in good condition.                                               Richard M. Marcelle Overlie, M.D.    RMH/MEDQ  D:  09/26/2003  T:  09/26/2003  Job:  [id number]

## 2011-03-08 NOTE — Op Note (Signed)
NAME:  Erin Wolf, COCKRUM                        ACCOUNT NO.:  [id number]   MEDICAL RECORD NO.:  [id number]                   PATIENT TYPE:  AMB   LOCATION:  SDC                                  FACILITY:  [facility]   PHYSICIAN:  Juluis Mire, M.D.                DATE OF BIRTH:  May 10, 1956   DATE OF PROCEDURE:  06/08/2003  DATE OF DISCHARGE:                                 OPERATIVE REPORT   PREOPERATIVE DIAGNOSIS:  Pelvic pain.   POSTOPERATIVE DIAGNOSIS:  Pelvic pain.   OPERATIVE PROCEDURES:  1. Open laparoscopy.  2. Lysis of adhesions.   SURGEON:  Juluis Mire, M.D.   ANESTHESIA:  General endotracheal.   ESTIMATED BLOOD LOSS:  Minimal.   PACKS AND DRAINS:  None.   INTRAOPERATIVE BLOOD:  None.   COMPLICATIONS:  None.   INDICATIONS FOR PROCEDURE:  Dictated in history and physical.   PROCEDURE AS FOLLOWS:  The patient taken to the OR, placed in supine  position.  After satisfactory level of  general endotracheal anesthesia was  obtained, the patient was placed in the dorsal lithotomy position using the  Allen stirrups.  The abdomen, perineum, and vagina were prepped out with  Betadine.  The bladder was then emptied by in-and-out catheterization.  A  sponge on a sponge stick was placed in the vaginal vault.  The patient was  then draped out for laparoscopy.  A subumbilical incision made with a knife  and extended through the subcutaneous tissue.  The fascia was identified and  entered sharply, and the incision extended laterally.  Two lateral sutures  of 0 Vicryl were put in place in the fascial edge and held.  The peritoneum  was entered and the open laparoscopic trocar was put in place and secured.  The laparoscopic was introduced and the abdomen was insufflated with carbon  dioxide.  A 5-mm trocar was put in place in the suprapubic area.  Visualization revealed the right ovary to be normal with no adhesive  process.  The left ovary was densely adherent to the anterior  abdominal wall  and the sigmoid colon.  We put in a third 5-mm trocar in the left lower  quadrant after visualization of the epigastric vessels.  The adhesions to  the anterior abdominal wall and the left ovary were taken down using cautery  and scissors.  The adhesions to the cecum were left in place as it was  densely adherent, it would be difficult to dissect without potential bowel  injury and probably would fall back and re-adhese.  She did have adhesions  to the vaginal cuff and the sigmoid colon and these were taken down.  At the  end of procedure, actually, the ovary was nicely freed up on the left, the  right side appeared to be normal, and the vaginal cuff was intact.  There  was no active processes such as endometriosis.  The  appendix was visualized  and noted to be normal.  The upper abdomen including the liver were clear.  The gallbladder was surgically absent.  There were some omental adhesions of  the upper abdominal wall.  At this point in time the abdomen was deflated of  its carbon dioxide.  All trocars removed.  The subumbilical fascia closed  with two figure-of-eights of 0 Vicryl, skin was closed with subcuticulars of  4-0 Vicryl.  The suprapubic incision was closed with Steri-Strips.  The  sponge on a sponge stick was removed from the vaginal vault.  The patient  taken out of the dorsal lithotomy position.  Once alert and extubated,  transferred to the recovery room in good condition.  Sponge, instrument, and  needle counts reported as correct per circulating nurse x2.                                               Juluis Mire, M.D.    JSM/MEDQ  D:  06/08/2003  T:  06/08/2003  Job:  [id number]

## 2011-03-08 NOTE — H&P (Signed)
NAME:  NADIA, VIAR                        ACCOUNT NO.:  [id number]   MEDICAL RECORD NO.:  [id number]                   PATIENT TYPE:  INP   LOCATION:  NA                                   FACILITY:  [facility]   PHYSICIAN:  Duke Salvia. Marcelle Overlie, M.D.            DATE OF BIRTH:  [birth year]   DATE OF ADMISSION:  09/26/2003  DATE OF DISCHARGE:                                HISTORY & PHYSICAL   CHIEF COMPLAINT:  Pelvic pain and dyspareunia.   HISTORY OF PRESENT ILLNESS:  The patient is a 55 year old Gravida I, Para 1  who underwent TAH by me in 1988 for fibroids.  She was seen recently in our  office on referral from Dr. Tiburcio Pea for abdominal and pelvic pain.  Laparoscopy was carried out which showed dense bilateral adnexal adhesions  parts of which underwent lysis of adhesions, but she has continued to have  problems with pelvic pain.  After reviewing the laparoscopic pictures and  due to continued problems with pain, she presents now for laparotomy with  BSO.  This procedure including the risks of bleeding, infection, transfusion  and adjacent organ injury were all reviewed with her which she understands  and accepts.   LABORATORY DATA:  FSH by Dr. Tiburcio Pea in April 2004 was normal at 17.0.   ALLERGIES:  1. ASPIRIN.  2. VICODIN.  3. DARVOCET.   PAST SURGICAL HISTORY:  1. Hysterectomy in 1988.  2. Cholecystectomy.  3. Tubal ligation.  4. Recent diagnostic laparoscopy.   REVIEW OF SYMPTOMS:  GI:  Significant for ulcerative colitis, currently in  remission. Her GI physician is Dr. Loreta Ave.   FAMILY HISTORY:  Significant for a brother with diabetes and a mother who  has had stomach cancer.  Sister, brother and mother all with hypertension.  This patient had a colonoscopy in 2003 by Dr. Loreta Ave.   PHYSICAL EXAMINATION:  VITAL SIGNS:  Temperature 98.2, blood pressure  108/70.  HEENT:  Unremarkable.  The neck is supple without masses.  LUNGS:  Clear.  CARDIOVASCULAR:  Regular rate and rhythm  without murmurs, rubs or gallops.  BREASTS:  Without masses.  ABDOMEN:  Soft, flat and non-tender.  PELVIC EXAM:  Normal external genitalia.  Vaginal cuff is clear.  Bimanual  revealed some tenderness, but no definite nodularity or masses.  EXTREMITIES:  Unremarkable.   IMPRESSION:  1. Pelvic pain/dyspareunia.  2. History of bilateral adnexal adhesions by recent laparoscopy.   PLAN:  Laparotomy with bilateral salpingo-oophorectomy.  The procedure and  risks were reviewed as above.                                               Richard M. Marcelle Overlie, M.D.    RMH/MEDQ  D:  09/20/2003  T:  09/20/2003  Job:  [id number]

## 2011-03-08 NOTE — H&P (Signed)
NAME:  Erin Wolf, Erin Wolf NO.:  [id number]   MEDICAL RECORD NO.:  [id number]                   PATIENT TYPE:  AMB   LOCATION:  SDC                                  FACILITY:  [facility]   PHYSICIAN:  Juluis Mire, M.D.                DATE OF BIRTH:  Jul 28, 1956   DATE OF ADMISSION:  06/08/2003  DATE OF DISCHARGE:                                HISTORY & PHYSICAL   Patient is a 55 year old, gravida 1, para 1, black female who presents for  diagnostic laparoscopy, laser standby and possible right salpingo-  oophorectomy.   In relation to the present admission, patient was referred to our practice  by Dr. Holley Bouche for evaluation of cystic enlargement of right ovary as  well as abdominal/pelvic pain.  She had had a previous total abdominal  hysterectomy done in 1988 for uterine fibroids.  She had been experiencing  increasing abdominal discomfort over the past eight months.  This had been  associated with deep dyspareunia that has become __________ .  The pain is  mainly in the right lower quadrant.  She had a previous CT scan done in the  emergency room that revealed a probable right ovarian cyst.  She had had  scattered colonic diverticula.  There was no evidence of appendicitis.  Subsequent followup ultrasound in our office confirmed a 3 x 3-cm cyst of  the right ovary with diffuse internal echos of septations, possibly  __________ versus hemorrhagic cyst.  In view of the persistent pain and  discomfort, the patient now presents for laparoscopic evaluation.   ALLERGIES:  In terms of allergies, patient is allergic to ASPIRIN, VICODIN  and DARVOCET.   MEDICATIONS:  1. Include trimethylene for blood pressure.  2. Wellbutrin.  3. Percocet as needed for pain.   PAST MEDICAL HISTORY:  She has had a history of colitis in the past.  Otherwise, usual childhood diseases without significant sequelae.   PAST SURGICAL HISTORY:  1. She has had a previous  bilateral tubal ligation.  2. She has had her gallbladder removed.  3. In 1988, had a hysterectomy.   OBSTETRICAL HISTORY:  She has had one spontaneous vaginal delivery.   FAMILY HISTORY:  Brother with a history of diabetes mellitus.  Mother had a  history of stomach cancer.  Strong family history of hypertension and heart  disease.   SOCIAL HISTORY:  Reveals no tobacco or alcohol use.   REVIEW OF SYSTEMS:  Noncontributory.   PHYSICAL EXAMINATION:  VITAL SIGNS:  Patient is afebrile, stable vital  signs.  HEENT:  Patient normocephalic.  Pupils equal, round, reactive to light and  accommodation.  Extraocular movements were intact.  Sclerae and conjunctivae  clear.  Oropharynx clear.  NECK:  Without thyromegaly.  BREASTS:  Were not examined.  LUNGS:  Clear.  CARDIOVASCULAR SYSTEM:  Regular rhythm, rate.  No murmurs or gallops.  ABDOMEN:  Is  benign.  No mass, or organomegaly or tenderness.  Well-healed  low transverse incision  PELVIC:  Normal external genitalia.  Vaginal mucosa is clear.  Cuff intact.  Bimanual exam:  Right-sided tenderness.  No fullness appreciated.  Left  adnexa unremarkable.  EXTREMITIES:  Trace edema.  NEUROLOGIC:  Grossly within normal limits.   BASIC IMPRESSION:  Cystic enlargement of the right ovary with associated  pelvic pain, possible endometriosis.   PLAN:  The patient will undergo diagnostic laparoscopy with laser standby,  with possible right salpingo-oophorectomy.  Risks of surgery have been  discussed, including the risk of anesthetics, the risk of infection, the  risk of hemorrhage that could necessitate transfusion with the risk of major  hepatitis, risk of injury to the adjacent organs, which could require  further exploratory surgery, the risks of deep venous thrombosis and  pulmonary embolus.  Patient expressed understanding of indications and  risks.                                               Juluis Mire, M.D.    JSM/MEDQ  D:   06/08/2003  T:  06/08/2003  Job:  [id number]

## 2011-03-08 NOTE — Discharge Summary (Signed)
NAME:  Erin Wolf, Erin Wolf                        ACCOUNT NO.:  [id number]   MEDICAL RECORD NO.:  [id number]                   PATIENT TYPE:  INP   LOCATION:  9305                                 FACILITY:  [facility]   PHYSICIAN:  Duke Salvia. Marcelle Overlie, M.D.            DATE OF BIRTH:  1956/09/25   DATE OF ADMISSION:  09/26/2003  DATE OF DISCHARGE:  09/29/2003                                 DISCHARGE SUMMARY   DISCHARGE DIAGNOSES:  1. Pelvic pain, dyspareunia.  2. Laparotomy with bilateral salpingo-oophorectomy this admission. Dense     bilateral adnexal adhesions noted.   SUMMARY OF HISTORY AND PHYSICAL:  Please see admission history and physical  for details.  Briefly, this is a 55 year old, G1, P1, prior TAH, who  continues to have problematic pelvic pain and dyspareunia. She underwent  laparoscopy this summer that showed bilateral adnexal adhesions and  presented now for laparotomy with BSO.   HOSPITAL COURSE:  On September 26, 2003, under general anesthesia, the patient  underwent laparotomy with dissection of both ovaries and BSO.  Dense  bilateral adnexal adhesions were noted. On the first postoperative day her  diet was advanced.  Postoperative hemoglobin was 10.6. She still had some  mild ileus on the second postoperative day. She was ambulated, her diet was  advanced, and she remained afebrile. By the third postoperative day she was  afebrile, tolerating a regular diet, felt much improved with her nausea, and  was ready for discharge at that point.   LABORATORY DATA:  CMET on admission was normal. UA negative.  Preoperative  hemoglobin was 12.9 and postoperative on September 27, 2003, was 10.6.  Pathology report is still pending.   DISPOSITION:  The patient is discharged on Demerol 50 mg, #30, one or two  p.o. q.4-6h. pain.  She will return to the office in one week. The clips  have been removed, Steri-Strips applied. Advised to report an incisional  redness or drainage, increased pain  or bleeding, or fever over 101.   CONDITION:  Good.   ACTIVITY:  Gradually increase.                                               Richard M. Marcelle Overlie, M.D.    RMH/MEDQ  D:  09/29/2003  T:  09/29/2003  Job:  [id number]

## 2011-03-08 NOTE — Procedures (Signed)
EEG NUMBER:  [id number]   CLINICAL INFORMATION:  This 55 year old patient has a history of [place]  seizures. The patient began having seizures in early 1980s treated with  Dilantin. She has been seizure free for years and was tapered off  medications. She began having seizures again this year with loss of  consciousness followed by shaking all over and Dilantin has been  reinstituted.   TECHNICAL DESCRIPTION:  This EEG was recorded entirely during the awake  state. There was no evidence of any stage II sleep present during this  recording. There was much low-voltage fast beta and muscle artifact present  throughout this study including teeth gritting. The background activity in  the occipital regions with eyes closed is 12-14 Hz rhythms. No evidence of  any focal asymmetry is present. Hyperventilation testing was performed  without abnormality seen. Photic stimulation did not produce a driving  response in the occipital head regions. There was no evidence of any stage  II sleep noted.   IMPRESSION:  This is a normal EEG during the awake state with much low-  voltage fast beta activity suggestive of drug effect.           ______________________________  Genene Churn. Sandria Manly, M.D.     NFA:OZHY  D:  07/12/2005 14:38:22  T:  07/13/2005 10:31:55  Job #:  [id number]   cc:   Bevelyn Buckles. Nash Shearer, M.D.  Fax: [phone]

## 2011-03-08 NOTE — Op Note (Signed)
NAME:  Erin Wolf, Erin Wolf              ACCOUNT NO.:  [id number]   MEDICAL RECORD NO.:  [id number]          PATIENT TYPE:  AMB   LOCATION:  ENDO                         FACILITY:  [facility]   PHYSICIAN:  Anselmo Rod, M.D.  DATE OF BIRTH:  11-07-1955   DATE OF PROCEDURE:  03/11/2005  DATE OF DISCHARGE:  03/11/2005                                 OPERATIVE REPORT   PROCEDURE PERFORMED:  Colonoscopy changed to a flexible sigmoidoscopy.   ENDOSCOPIST:  Anselmo Rod, M.D.   INSTRUMENT USED:  Olympus video colonoscope.   INDICATION FOR PROCEDURE:  [age]-year-old African American female with  a history of ulcerative colitis diagnosed 4 years ago, undergoing a repeat  colonoscopy to evaluate extent of disease.   PREPROCEDURE PREPARATION:  Informed consent was procured from the patient.  The patient was fasted for 8 hours prior to the procedure and prepped with a  bottle of magnesium citrate and a gallon of GoLYTELY the night prior to the  procedure.  The risks and benefits of the procedure including a 10% missed  rate of cancer in polyps were discussed with the patient as well.   PREPROCEDURE PHYSICAL:  VITAL SIGNS:  The patient had stable vital signs.  NECK:  Supple.  CHEST:  Clear to auscultation with normal S1 and S2.  ABDOMEN:  Soft with normal bowel sounds.   DESCRIPTION OF THE PROCEDURE:  The patient was placed in the left lateral  decubitus position and sedated with Demerol and Versed.  Once the patient  was adequately sedated, the Olympus video colonoscope was advanced from the  rectum to the mid-sigmoid colon with difficulty.  There was solid stool  within the sigmoid colon; the procedure was aborted at this point with plans  to re-prep the patient and redo the procedure at a later date.  There was  significant ulceration noted in the sigmoid colon, but complete  visualization was not possible.   IMPRESSION:  Incomplete procedure, procedure aborted in mid sigmoid colon,  large amount of residual stool in the colon.   RECOMMENDATIONS:  The patient will be re-prepped for a colonoscopy later  this week and further recommendations made as needed.      JNM/MEDQ  D:  03/12/2005  T:  03/13/2005  Job:  [id number]   cc:   Melida Quitter, M.D.  [address]  [place]  [place] [address]  Fax: [phone]

## 2011-03-08 NOTE — Procedures (Signed)
[facility]  Patient:    Erin Wolf, Erin Wolf Visit Number: [id number] MRN: [id number]          Service Type: END Location: ENDO Attending Physician:  Louie Bun Proc. Date: 06/17/01 Adm. Date:  06/17/2001   CC:         Arvella Merles, M.D.   Procedure Report  PROCEDURE:  Colonoscopy with biopsy.  SURGEON:  John C. Madilyn Fireman, M.D.  INDICATIONS FOR PROCEDURE:  Recurrent colitis, last evaluated in 1997 with no refractory symptoms requiring corticosteroids.  DESCRIPTION OF PROCEDURE:  The patient was placed in the left lateral decubitus position and placed on the pulse monitor with continuous low flow oxygen delivered by nasal cannula.  She was sedated with 75 mg of IV Demerol and 9 mg of IV Versed.  The Olympus video colonoscope was inserted into the rectum and advanced to the cecum, confirmed by transillumination at McBurneys point, and visualization of the ileocecal valve and appendiceal orifice.  The prep was excellent.  The cecum appeared normal.  A single diverticulum was seen in the ascending colon which was otherwise normal.  The transverse and descending colon appeared normal with no polyps, masses, diverticula, or other mucosal abnormalities.  Within the sigmoid colon, there were seen areas of patchy erythema, granularity, and edema consistent with ulcerative colitis. As the scope was withdrawn further, this appearance faded, and there were areas of essentially normal-appearing mucosa from about 25 cm down to about 12-15 cm where appearance of proctitis resumed.  Biopsies were taken of the distal 10 cm of the rectum.  No pseudopolyps, ulcers, or fissures were seen.  The patient scope was then withdrawn and the patient returned to the recovery room in stable condition.  She tolerated the procedure well and there were no immediate complications.  IMPRESSION:  Colitis limited to the sigmoid and distal rectum with a skip area in  between.  PLAN:  Will resume Rowasa enemas and taper corticosteroids.  Will try to use topical therapy for future flares. Attending Physician:  Louie Bun DD:  06/17/01 TD:  06/17/01 Job: [id number]

## 2011-05-06 ENCOUNTER — Emergency Department (HOSPITAL_COMMUNITY): Payer: Medicare Other

## 2011-05-06 ENCOUNTER — Emergency Department (HOSPITAL_COMMUNITY)
Admission: EM | Admit: 2011-05-06 | Discharge: 2011-05-07 | Disposition: A | Discharge status: Home / Self Care | Payer: Medicare Other | Attending: Emergency Medicine | Admitting: Emergency Medicine

## 2011-05-06 DIAGNOSIS — Z79899 Other long term (current) drug therapy: Secondary | ICD-10-CM | POA: Insufficient documentation

## 2011-05-06 DIAGNOSIS — I10 Essential (primary) hypertension: Secondary | ICD-10-CM | POA: Insufficient documentation

## 2011-05-06 DIAGNOSIS — R109 Unspecified abdominal pain: Secondary | ICD-10-CM | POA: Insufficient documentation

## 2011-05-06 DIAGNOSIS — R55 Syncope and collapse: Secondary | ICD-10-CM | POA: Insufficient documentation

## 2011-05-06 DIAGNOSIS — K5289 Other specified noninfective gastroenteritis and colitis: Secondary | ICD-10-CM | POA: Insufficient documentation

## 2011-05-06 DIAGNOSIS — G40909 Epilepsy, unspecified, not intractable, without status epilepticus: Secondary | ICD-10-CM | POA: Insufficient documentation

## 2011-05-06 DIAGNOSIS — K5730 Diverticulosis of large intestine without perforation or abscess without bleeding: Secondary | ICD-10-CM | POA: Insufficient documentation

## 2011-05-06 HISTORY — DX: Epilepsy, unspecified, not intractable, without status epilepticus: G40.909

## 2011-05-06 LAB — DIFFERENTIAL
Basophils Absolute: 0 10*3/uL (ref 0.0–0.1)
Basophils Relative: 0 % (ref 0–1)
Eosinophils Absolute: 0 10*3/uL (ref 0.0–0.7)
Eosinophils Relative: 1 % (ref 0–5)
Lymphs Abs: 3.2 10*3/uL (ref 0.7–4.0)
Neutrophils Relative %: 36 % — ABNORMAL LOW (ref 43–77)

## 2011-05-06 LAB — LACTIC ACID, PLASMA: Lactic Acid, Venous: 1.3 mmol/L (ref 0.5–2.2)

## 2011-05-06 LAB — URINALYSIS, ROUTINE W REFLEX MICROSCOPIC
Nitrite: NEGATIVE
Protein, ur: NEGATIVE mg/dL
Specific Gravity, Urine: 1.018 (ref 1.005–1.030)
Urobilinogen, UA: 0.2 mg/dL (ref 0.0–1.0)

## 2011-05-06 LAB — CBC
MCV: 78.8 fL (ref 78.0–100.0)
Platelets: 331 10*3/uL (ref 150–400)
RBC: 5.32 MIL/uL — ABNORMAL HIGH (ref 3.87–5.11)
RDW: 15.8 % — ABNORMAL HIGH (ref 11.5–15.5)
WBC: 5.7 10*3/uL (ref 4.0–10.5)

## 2011-05-06 LAB — URINE MICROSCOPIC-ADD ON

## 2011-05-07 ENCOUNTER — Encounter (HOSPITAL_COMMUNITY): Payer: Self-pay | Admitting: Radiology

## 2011-05-07 LAB — URINE CULTURE
Colony Count: NO GROWTH
Culture: NO GROWTH

## 2011-05-07 LAB — COMPREHENSIVE METABOLIC PANEL WITH GFR
ALT: 19 U/L (ref 0–35)
AST: 17 U/L (ref 0–37)
Albumin: 3.3 g/dL — ABNORMAL LOW (ref 3.5–5.2)
Alkaline Phosphatase: 42 U/L (ref 39–117)
BUN: 12 mg/dL (ref 6–23)
CO2: 27 meq/L (ref 19–32)
Calcium: 8.7 mg/dL (ref 8.4–10.5)
Chloride: 97 meq/L (ref 96–112)
Creatinine, Ser: 0.53 mg/dL (ref 0.50–1.10)
GFR calc Af Amer: >60 mL/min
GFR calc non Af Amer: >60 mL/min
Glucose, Bld: 87 mg/dL (ref 70–99)
Potassium: 3 meq/L — ABNORMAL LOW (ref 3.5–5.1)
Sodium: 134 meq/L — ABNORMAL LOW (ref 135–145)
Total Bilirubin: 0.2 mg/dL — ABNORMAL LOW (ref 0.3–1.2)
Total Protein: 7.3 g/dL (ref 6.0–8.3)

## 2011-05-07 LAB — LIPASE, BLOOD: Lipase: 18 U/L (ref 11–59)

## 2011-05-07 LAB — CARDIAC PANEL(CRET KIN+CKTOT+MB+TROPI)
Relative Index: INVALID (ref 0.0–2.5)
Troponin I: <0.3 ng/mL (ref <0.30)

## 2011-05-07 MED ORDER — IOHEXOL 300 MG/ML  SOLN
125.0000 mL | Freq: Once | INTRAMUSCULAR | Status: AC | PRN
  Administered 2011-05-07: 125 mL via INTRAVENOUS

## 2011-06-13 ENCOUNTER — Emergency Department (HOSPITAL_COMMUNITY)
Admission: EM | Admit: 2011-06-13 | Discharge: 2011-06-14 | Disposition: A | Discharge status: Home / Self Care | Payer: Medicare Other | Attending: Emergency Medicine | Admitting: Emergency Medicine

## 2011-06-13 DIAGNOSIS — F341 Dysthymic disorder: Secondary | ICD-10-CM | POA: Insufficient documentation

## 2011-06-13 DIAGNOSIS — G40909 Epilepsy, unspecified, not intractable, without status epilepticus: Secondary | ICD-10-CM | POA: Insufficient documentation

## 2011-06-13 DIAGNOSIS — R109 Unspecified abdominal pain: Secondary | ICD-10-CM | POA: Insufficient documentation

## 2011-06-13 DIAGNOSIS — G8929 Other chronic pain: Secondary | ICD-10-CM | POA: Insufficient documentation

## 2011-06-13 DIAGNOSIS — Z79899 Other long term (current) drug therapy: Secondary | ICD-10-CM | POA: Insufficient documentation

## 2011-06-13 DIAGNOSIS — I10 Essential (primary) hypertension: Secondary | ICD-10-CM | POA: Insufficient documentation

## 2011-06-13 LAB — DIFFERENTIAL
Basophils Absolute: 0 10*3/uL (ref 0.0–0.1)
Basophils Relative: 0 % (ref 0–1)
Eosinophils Absolute: 0 10*3/uL (ref 0.0–0.7)
Eosinophils Relative: 1 % (ref 0–5)
Lymphocytes Relative: 37 % (ref 12–46)
Lymphs Abs: 2.1 10*3/uL (ref 0.7–4.0)
Monocytes Absolute: 0.7 10*3/uL (ref 0.1–1.0)
Monocytes Relative: 12 % (ref 3–12)
Neutro Abs: 2.8 10*3/uL (ref 1.7–7.7)
Neutrophils Relative %: 50 % (ref 43–77)

## 2011-06-13 LAB — ETHANOL: Alcohol, Ethyl (B): <11 mg/dL (ref 0–11)

## 2011-06-13 LAB — COMPREHENSIVE METABOLIC PANEL WITH GFR
ALT: 20 U/L (ref 0–35)
AST: 17 U/L (ref 0–37)
Albumin: 3.4 g/dL — ABNORMAL LOW (ref 3.5–5.2)
Alkaline Phosphatase: 51 U/L (ref 39–117)
BUN: 9 mg/dL (ref 6–23)
CO2: 27 meq/L (ref 19–32)
Calcium: 9.5 mg/dL (ref 8.4–10.5)
Chloride: 102 meq/L (ref 96–112)
Creatinine, Ser: 0.7 mg/dL (ref 0.50–1.10)
GFR calc Af Amer: >60 mL/min
GFR calc non Af Amer: >60 mL/min
Glucose, Bld: 111 mg/dL — ABNORMAL HIGH (ref 70–99)
Potassium: 3.5 meq/L (ref 3.5–5.1)
Sodium: 139 meq/L (ref 135–145)
Total Bilirubin: 0.1 mg/dL — ABNORMAL LOW (ref 0.3–1.2)
Total Protein: 7.8 g/dL (ref 6.0–8.3)

## 2011-06-13 LAB — CBC
HCT: 38.1 % (ref 36.0–46.0)
Hemoglobin: 12.4 g/dL (ref 12.0–15.0)
MCH: 25.6 pg — ABNORMAL LOW (ref 26.0–34.0)
MCHC: 32.5 g/dL (ref 30.0–36.0)
MCV: 78.6 fL (ref 78.0–100.0)
Platelets: 304 10*3/uL (ref 150–400)
RBC: 4.85 MIL/uL (ref 3.87–5.11)
RDW: 16.3 % — ABNORMAL HIGH (ref 11.5–15.5)
WBC: 5.7 10*3/uL (ref 4.0–10.5)

## 2011-06-13 LAB — RAPID URINE DRUG SCREEN, HOSP PERFORMED
Amphetamines: NOT DETECTED
Benzodiazepines: NOT DETECTED

## 2011-06-19 ENCOUNTER — Emergency Department (HOSPITAL_COMMUNITY)
Admission: EM | Admit: 2011-06-19 | Discharge: 2011-06-19 | Disposition: A | Discharge status: Home / Self Care | Payer: Medicare Other | Attending: Emergency Medicine | Admitting: Emergency Medicine

## 2011-06-19 DIAGNOSIS — R110 Nausea: Secondary | ICD-10-CM | POA: Insufficient documentation

## 2011-06-19 DIAGNOSIS — K5289 Other specified noninfective gastroenteritis and colitis: Secondary | ICD-10-CM | POA: Insufficient documentation

## 2011-06-19 DIAGNOSIS — F329 Major depressive disorder, single episode, unspecified: Secondary | ICD-10-CM | POA: Insufficient documentation

## 2011-06-19 DIAGNOSIS — G40909 Epilepsy, unspecified, not intractable, without status epilepticus: Secondary | ICD-10-CM | POA: Insufficient documentation

## 2011-06-19 DIAGNOSIS — F3289 Other specified depressive episodes: Secondary | ICD-10-CM | POA: Insufficient documentation

## 2011-06-19 DIAGNOSIS — R109 Unspecified abdominal pain: Secondary | ICD-10-CM | POA: Insufficient documentation

## 2011-06-19 DIAGNOSIS — I10 Essential (primary) hypertension: Secondary | ICD-10-CM | POA: Insufficient documentation

## 2011-06-19 LAB — LIPASE, BLOOD: Lipase: 20 U/L (ref 11–59)

## 2011-06-19 LAB — COMPREHENSIVE METABOLIC PANEL
Albumin: 3.9 g/dL (ref 3.5–5.2)
BUN: 8 mg/dL (ref 6–23)
Calcium: 9.8 mg/dL (ref 8.4–10.5)
Creatinine, Ser: 0.52 mg/dL (ref 0.50–1.10)
Total Protein: 8.5 g/dL — ABNORMAL HIGH (ref 6.0–8.3)

## 2011-06-19 LAB — CBC
HCT: 38.8 % (ref 36.0–46.0)
MCH: 25.2 pg — ABNORMAL LOW (ref 26.0–34.0)
MCHC: 32.2 g/dL (ref 30.0–36.0)
MCV: 78.1 fL (ref 78.0–100.0)
RDW: 16.1 % — ABNORMAL HIGH (ref 11.5–15.5)

## 2011-06-19 LAB — DIFFERENTIAL
Basophils Absolute: 0 10*3/uL (ref 0.0–0.1)
Eosinophils Relative: 1 % (ref 0–5)
Lymphocytes Relative: 47 % — ABNORMAL HIGH (ref 12–46)
Lymphs Abs: 2.3 10*3/uL (ref 0.7–4.0)
Monocytes Absolute: 0.5 10*3/uL (ref 0.1–1.0)

## 2011-06-19 LAB — URINALYSIS, ROUTINE W REFLEX MICROSCOPIC
Bilirubin Urine: NEGATIVE
Ketones, ur: NEGATIVE mg/dL
Nitrite: NEGATIVE
pH: 7.5 (ref 5.0–8.0)

## 2011-06-19 LAB — POCT PREGNANCY, URINE: Preg Test, Ur: NEGATIVE

## 2011-07-02 ENCOUNTER — Ambulatory Visit (HOSPITAL_COMMUNITY)
Admission: RE | Admit: 2011-07-02 | Discharge: 2011-07-02 | Disposition: A | Discharge status: Home / Self Care | Payer: Medicare Other | Source: Ambulatory Visit | Attending: Gastroenterology | Admitting: Gastroenterology

## 2011-07-02 ENCOUNTER — Other Ambulatory Visit: Payer: Self-pay | Admitting: Gastroenterology

## 2011-07-02 DIAGNOSIS — R109 Unspecified abdominal pain: Secondary | ICD-10-CM | POA: Insufficient documentation

## 2011-07-02 DIAGNOSIS — R112 Nausea with vomiting, unspecified: Secondary | ICD-10-CM | POA: Insufficient documentation

## 2011-07-02 DIAGNOSIS — R933 Abnormal findings on diagnostic imaging of other parts of digestive tract: Secondary | ICD-10-CM | POA: Insufficient documentation

## 2011-07-02 DIAGNOSIS — K5730 Diverticulosis of large intestine without perforation or abscess without bleeding: Secondary | ICD-10-CM | POA: Insufficient documentation

## 2011-07-02 DIAGNOSIS — Z79899 Other long term (current) drug therapy: Secondary | ICD-10-CM | POA: Insufficient documentation

## 2011-07-02 DIAGNOSIS — G40909 Epilepsy, unspecified, not intractable, without status epilepticus: Secondary | ICD-10-CM | POA: Insufficient documentation

## 2011-07-02 DIAGNOSIS — R197 Diarrhea, unspecified: Secondary | ICD-10-CM | POA: Insufficient documentation

## 2011-07-02 DIAGNOSIS — K6389 Other specified diseases of intestine: Secondary | ICD-10-CM | POA: Insufficient documentation

## 2011-07-02 DIAGNOSIS — E785 Hyperlipidemia, unspecified: Secondary | ICD-10-CM | POA: Insufficient documentation

## 2011-07-02 DIAGNOSIS — I10 Essential (primary) hypertension: Secondary | ICD-10-CM | POA: Insufficient documentation

## 2011-07-02 DIAGNOSIS — D126 Benign neoplasm of colon, unspecified: Secondary | ICD-10-CM | POA: Insufficient documentation

## 2011-07-02 DIAGNOSIS — K5289 Other specified noninfective gastroenteritis and colitis: Secondary | ICD-10-CM | POA: Insufficient documentation

## 2011-07-02 LAB — POCT I-STAT 4, (NA,K, GLUC, HGB,HCT)
Glucose, Bld: 76 mg/dL (ref 70–99)
HCT: 45 % (ref 36.0–46.0)
Sodium: 151 mEq/L — ABNORMAL HIGH (ref 135–145)

## 2011-08-01 LAB — COMPREHENSIVE METABOLIC PANEL
ALT: 24
AST: 24
Albumin: 3.7
Alkaline Phosphatase: 45
CO2: 30
Chloride: 101
GFR calc Af Amer: >60
Potassium: 3.6
Sodium: 139
Total Bilirubin: 0.5

## 2011-08-01 LAB — URINALYSIS, ROUTINE W REFLEX MICROSCOPIC
Protein, ur: NEGATIVE
Specific Gravity, Urine: 1.026
Urobilinogen, UA: 0.2

## 2011-08-01 LAB — DIFFERENTIAL
Basophils Absolute: 0
Basophils Relative: 0
Eosinophils Absolute: 0
Monocytes Relative: 10
Neutrophils Relative %: 53

## 2011-08-01 LAB — CBC
Platelets: 315
RBC: 5.01
WBC: 4

## 2011-08-06 LAB — DIFFERENTIAL
Basophils Absolute: 0
Lymphocytes Relative: 52 — ABNORMAL HIGH
Lymphs Abs: 2.7
Monocytes Absolute: 0.5
Monocytes Relative: 10
Neutro Abs: 2

## 2011-08-06 LAB — URINALYSIS, ROUTINE W REFLEX MICROSCOPIC
Bilirubin Urine: NEGATIVE
Glucose, UA: NEGATIVE
Hgb urine dipstick: NEGATIVE
Ketones, ur: NEGATIVE
Nitrite: NEGATIVE
Protein, ur: NEGATIVE
Specific Gravity, Urine: 1.015
Urobilinogen, UA: 0.2
pH: 8

## 2011-08-06 LAB — CBC
HCT: 39.9
Hemoglobin: 13.3
MCHC: 33.3
MCV: 78.3
Platelets: 292
RBC: 5.09
RDW: 15.6 — ABNORMAL HIGH
WBC: 5.2

## 2011-08-06 LAB — COMPREHENSIVE METABOLIC PANEL
Albumin: 3.8
BUN: 10
Calcium: 9.9
Creatinine, Ser: 0.62
Total Protein: 8.6 — ABNORMAL HIGH

## 2011-10-21 ENCOUNTER — Encounter (HOSPITAL_COMMUNITY): Payer: Self-pay | Admitting: *Deleted

## 2011-10-21 ENCOUNTER — Emergency Department (HOSPITAL_COMMUNITY): Payer: Medicare Other

## 2011-10-21 ENCOUNTER — Other Ambulatory Visit (HOSPITAL_COMMUNITY): Payer: Self-pay

## 2011-10-21 ENCOUNTER — Emergency Department (HOSPITAL_COMMUNITY)
Admission: EM | Admit: 2011-10-21 | Discharge: 2011-10-21 | Disposition: A | Discharge status: Home / Self Care | Payer: Medicare Other | Attending: Emergency Medicine | Admitting: Emergency Medicine

## 2011-10-21 DIAGNOSIS — R1110 Vomiting, unspecified: Secondary | ICD-10-CM | POA: Insufficient documentation

## 2011-10-21 DIAGNOSIS — Z9089 Acquired absence of other organs: Secondary | ICD-10-CM | POA: Insufficient documentation

## 2011-10-21 DIAGNOSIS — Z90710 Acquired absence of both cervix and uterus: Secondary | ICD-10-CM | POA: Insufficient documentation

## 2011-10-21 DIAGNOSIS — R10817 Generalized abdominal tenderness: Secondary | ICD-10-CM | POA: Insufficient documentation

## 2011-10-21 DIAGNOSIS — R109 Unspecified abdominal pain: Secondary | ICD-10-CM | POA: Insufficient documentation

## 2011-10-21 DIAGNOSIS — R197 Diarrhea, unspecified: Secondary | ICD-10-CM | POA: Insufficient documentation

## 2011-10-21 LAB — COMPREHENSIVE METABOLIC PANEL
Alkaline Phosphatase: 44 U/L (ref 39–117)
BUN: 9 mg/dL (ref 6–23)
GFR calc Af Amer: >90 mL/min (ref >90)
Glucose, Bld: 83 mg/dL (ref 70–99)
Potassium: 3.2 mEq/L — ABNORMAL LOW (ref 3.5–5.1)
Total Bilirubin: 0.3 mg/dL (ref 0.3–1.2)
Total Protein: 7.9 g/dL (ref 6.0–8.3)

## 2011-10-21 LAB — DIFFERENTIAL
Eosinophils Absolute: 0 10*3/uL (ref 0.0–0.7)
Lymphs Abs: 3.1 10*3/uL (ref 0.7–4.0)
Monocytes Relative: 10 % (ref 3–12)
Neutrophils Relative %: 30 % — ABNORMAL LOW (ref 43–77)

## 2011-10-21 LAB — URINALYSIS, ROUTINE W REFLEX MICROSCOPIC
Bilirubin Urine: NEGATIVE
Nitrite: NEGATIVE
Protein, ur: NEGATIVE mg/dL
Specific Gravity, Urine: 1.014 (ref 1.005–1.030)
Urobilinogen, UA: 0.2 mg/dL (ref 0.0–1.0)

## 2011-10-21 LAB — LIPASE, BLOOD: Lipase: 14 U/L (ref 11–59)

## 2011-10-21 LAB — CBC
Hemoglobin: 12.2 g/dL (ref 12.0–15.0)
MCH: 25.2 pg — ABNORMAL LOW (ref 26.0–34.0)
MCV: 77.3 fL — ABNORMAL LOW (ref 78.0–100.0)
RBC: 4.85 MIL/uL (ref 3.87–5.11)

## 2011-10-21 MED ORDER — ONDANSETRON HCL 4 MG/2ML IJ SOLN
4.0000 mg | Freq: Once | INTRAMUSCULAR | Status: AC
  Administered 2011-10-21: 4 mg via INTRAVENOUS
  Filled 2011-10-21: qty 2

## 2011-10-21 MED ORDER — FENTANYL CITRATE 0.05 MG/ML IJ SOLN
25.0000 ug | Freq: Once | INTRAMUSCULAR | Status: AC
  Administered 2011-10-21: 25 ug via INTRAVENOUS
  Filled 2011-10-21: qty 2

## 2011-10-21 MED ORDER — PROMETHAZINE HCL 25 MG/ML IJ SOLN: 12.5000 mg | Freq: Once | INTRAMUSCULAR | Status: DC

## 2011-10-21 MED ORDER — IOHEXOL 300 MG/ML  SOLN
100.0000 mL | Freq: Once | INTRAMUSCULAR | Status: AC | PRN
  Administered 2011-10-21: 100 mL via INTRAVENOUS

## 2011-10-21 MED ORDER — PROMETHAZINE HCL 25 MG PO TABS: 25.0000 mg | ORAL_TABLET | Freq: Four times a day (QID) | ORAL | Status: DC | PRN

## 2011-10-21 MED ORDER — PROMETHAZINE HCL 25 MG/ML IJ SOLN
12.5000 mg | Freq: Once | INTRAMUSCULAR | Status: DC
  Filled 2011-10-21: qty 1

## 2011-10-21 MED ORDER — FENTANYL CITRATE 0.05 MG/ML IJ SOLN
50.0000 ug | Freq: Once | INTRAMUSCULAR | Status: DC
  Filled 2011-10-21: qty 2

## 2011-10-21 MED ORDER — SODIUM CHLORIDE 0.9 % IV BOLUS (SEPSIS)
1000.0000 mL | Freq: Once | INTRAVENOUS | Status: AC
  Administered 2011-10-21: 1000 mL via INTRAVENOUS

## 2011-10-21 MED ORDER — FENTANYL CITRATE 0.05 MG/ML IJ SOLN
50.0000 ug | Freq: Once | INTRAMUSCULAR | Status: AC
  Administered 2011-10-21: 100 ug via INTRAVENOUS
  Filled 2011-10-21: qty 2

## 2011-10-21 NOTE — ED Notes (Signed)
IV team notified of IV start.

## 2011-10-21 NOTE — ED Notes (Signed)
Pt states 'I think I'm having a colitis flare up, been vomiting & having diarrhea, see some blood in my stool, it's mucus like"

## 2011-10-21 NOTE — ED Provider Notes (Signed)
History     CSN: [id number]  Arrival date & time 10/21/11  1254   First MD Initiated Contact with Patient 10/21/11 1615    HPI Patient report that his abdominal pain that began yesterday. States pain feels similar to prior ulcerative colitis flareups. States yesterday had multiple episodes of mucousy diarrhea streaked with pink. Denies flatulence or belching. Denies urinary symptoms or vaginal symptoms, fever, back pain. Patient is a 55 y.o. female presenting with abdominal pain. The history is provided by the patient.  Abdominal Pain The primary symptoms of the illness include abdominal pain, vomiting and diarrhea. The primary symptoms of the illness do not include fever, shortness of breath, nausea, dysuria or vaginal discharge. The current episode started yesterday. The onset of the illness was gradual. The problem has been gradually worsening.  The abdominal pain began yesterday. The abdominal pain has been gradually worsening since its onset. The abdominal pain is generalized. The severity of the abdominal pain is 10/10. The abdominal pain is relieved by nothing. The abdominal pain is exacerbated by movement.  The diarrhea is mucous and watery.  Symptoms associated with the illness do not include chills, anorexia, diaphoresis, heartburn, constipation, urgency, hematuria, frequency or back pain. Associated medical issues comments: colitis.    Past Medical History  Diagnosis Date  . Hypertension   . Ulcerative colitis   . Migraines   . Seizure disorder     Past Surgical History  Procedure Date  . Cholecystectomy   . Abdominal hysterectomy   . Oophrectomy     No family history on file.  History  Substance Use Topics  . Smoking status: Never Smoker   . Smokeless tobacco: Not on file  . Alcohol Use: Yes     RARELY    OB History    Grav Para Term Preterm Abortions TAB SAB Ect Mult Living                  Review of Systems  Constitutional: Negative for fever, chills and  diaphoresis.  HENT: Negative for neck pain.   Respiratory: Negative for shortness of breath.   Cardiovascular: Negative for chest pain.  Gastrointestinal: Positive for vomiting, abdominal pain and diarrhea. Negative for heartburn, nausea, constipation and anorexia.  Genitourinary: Negative for dysuria, urgency, frequency, hematuria, flank pain, vaginal discharge and vaginal pain.  Musculoskeletal: Negative for back pain.  All other systems reviewed and are negative.    Allergies  Aspirin; Darvocet; Dilaudid; Midrin; Penicillins; and Wellbutrin  Home Medications   Current Outpatient Rx  Name Route Sig Dispense Refill  . FLUOXETINE HCL 20 MG PO TABS Oral Take 20 mg by mouth daily.      Marland Kitchen PHENYTOIN SODIUM EXTENDED 100 MG PO CAPS Oral Take by mouth 3 (three) times daily.      . TRIAMTERENE 50 MG PO CAPS Oral Take 50 mg by mouth 2 (two) times daily.        BP 129/81  Pulse 77  Resp 18  Wt 220 lb (99.791 kg)  SpO2 99%  Physical Exam  Vitals reviewed. Constitutional: She is oriented to person, place, and time. Vital signs are normal. She appears well-developed and well-nourished.  HENT:  Head: Normocephalic and atraumatic.  Eyes: Conjunctivae are normal. Pupils are equal, round, and reactive to light.  Neck: Normal range of motion. Neck supple.  Cardiovascular: Normal rate, regular rhythm and normal heart sounds.  Exam reveals no friction rub.   No murmur heard. Pulmonary/Chest: Effort normal and breath sounds  normal. She has no wheezes. She has no rhonchi. She has no rales. She exhibits no tenderness.  Abdominal: Soft. Bowel sounds are normal. She exhibits no distension and no mass. There is generalized tenderness. There is no rebound and no guarding.  Musculoskeletal: Normal range of motion.  Neurological: She is alert and oriented to person, place, and time. Coordination normal.  Skin: Skin is warm and dry. No rash noted. No erythema. No pallor.    ED Course   Procedures Results for orders placed during the hospital encounter of 10/21/11  CBC      Component Value Range   WBC 5.1  4.0 - 10.5 (K/uL)   RBC 4.85  3.87 - 5.11 (MIL/uL)   Hemoglobin 12.2  12.0 - 15.0 (g/dL)   HCT 16.1  09.6 - 04.5 (%)   MCV 77.3 (*) 78.0 - 100.0 (fL)   MCH 25.2 (*) 26.0 - 34.0 (pg)   MCHC 32.5  30.0 - 36.0 (g/dL)   RDW 40.9 (*) 81.1 - 15.5 (%)   Platelets 289  150 - 400 (K/uL)  DIFFERENTIAL      Component Value Range   Neutrophils Relative 30 (*) 43 - 77 (%)   Neutro Abs 1.5 (*) 1.7 - 7.7 (K/uL)   Lymphocytes Relative 60 (*) 12 - 46 (%)   Lymphs Abs 3.1  0.7 - 4.0 (K/uL)   Monocytes Relative 10  3 - 12 (%)   Monocytes Absolute 0.5  0.1 - 1.0 (K/uL)   Eosinophils Relative 0  0 - 5 (%)   Eosinophils Absolute 0.0  0.0 - 0.7 (K/uL)   Basophils Relative 0  0 - 1 (%)   Basophils Absolute 0.0  0.0 - 0.1 (K/uL)  COMPREHENSIVE METABOLIC PANEL      Component Value Range   Sodium 131 (*) 135 - 145 (mEq/L)   Potassium 3.2 (*) 3.5 - 5.1 (mEq/L)   Chloride 95 (*) 96 - 112 (mEq/L)   CO2 19  19 - 32 (mEq/L)   Glucose, Bld 83  70 - 99 (mg/dL)   BUN 9  6 - 23 (mg/dL)   Creatinine, Ser 9.14 (*) 0.50 - 1.10 (mg/dL)   Calcium 9.1  8.4 - 78.2 (mg/dL)   Total Protein 7.9  6.0 - 8.3 (g/dL)   Albumin 3.7  3.5 - 5.2 (g/dL)   AST 19  0 - 37 (U/L)   ALT 16  0 - 35 (U/L)   Alkaline Phosphatase 44  39 - 117 (U/L)   Total Bilirubin 0.3  0.3 - 1.2 (mg/dL)   GFR calc non Af Amer >90  >90 (mL/min)   GFR calc Af Amer >90  >90 (mL/min)  LIPASE, BLOOD      Component Value Range   Lipase 14  11 - 59 (U/L)  URINALYSIS, ROUTINE W REFLEX MICROSCOPIC      Component Value Range   Color, Urine YELLOW  YELLOW    APPearance CLEAR  CLEAR    Specific Gravity, Urine 1.014  1.005 - 1.030    pH 7.5  5.0 - 8.0    Glucose, UA NEGATIVE  NEGATIVE (mg/dL)   Hgb urine dipstick NEGATIVE  NEGATIVE    Bilirubin Urine NEGATIVE  NEGATIVE    Ketones, ur TRACE (*) NEGATIVE (mg/dL)   Protein, ur  NEGATIVE  NEGATIVE (mg/dL)   Urobilinogen, UA 0.2  0.0 - 1.0 (mg/dL)   Nitrite NEGATIVE  NEGATIVE    Leukocytes, UA NEGATIVE  NEGATIVE   POCT PREGNANCY, URINE  Component Value Range   Preg Test, Ur NEGATIVE     Ct Abdomen Pelvis W Contrast  10/21/2011  *RADIOLOGY REPORT*  Clinical Data: Pain with vomiting.  History of colitis.  CT ABDOMEN AND PELVIS WITH CONTRAST  Technique:  Multidetector CT imaging of the abdomen and pelvis was performed following the standard protocol during bolus administration of intravenous contrast.  Contrast: OMNIPAQUE IOHEXOL 300 MG/ML IV SOLN  Comparison: CT scan dated 05/07/2011 and radiographs dated 10/20/2000  Findings: The liver, spleen, pancreas, adrenal glands, and kidneys demonstrate no significant abnormalities.  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  Gallbladder has been removed.  Bile ducts and pancreatic duct are less prominent than on the prior study.  There are a few diverticula in the distal descending and proximal sigmoid portions of the colon but there is no acute inflammation. Uterus and ovaries and appendix appear to have been removed.  IMPRESSION: No acute abnormalities.  Original Report Authenticated By: Gwynn Burly, M.D.   Dg Abd 2 Views  10/21/2011  *RADIOLOGY REPORT*  Clinical Data: Diffuse abdominal pain.  Diarrhea.  History of colitis.  ABDOMEN - 2 VIEW 10/21/2011:  Comparison: Acute abdomen series 05/06/2011.  CT abdomen pelvis 05/07/2011.  Findings: Bowel gas pattern nonobstructive.  No evidence of significant ileus.  No evidence of free air or significant air fluid levels on the erect image.  Large stool burden.  Suggestion of pneumatosis involving the mid descending colon, though this may be be artifactual and related to a gas surrounding a stool ball in this location phleboliths in the pelvis.  No visible opaque urinary tract calculi.  Degenerative changes involving lower lumbar spine.  IMPRESSION:  Large stool burden.  Possible pneumatosis involving the mid descending colon, though this may be artifactual and related to gas surrounding a stool ball in this location.  Is there left mid abdominal tenderness?  CT may be confirmatory.  Original Report Authenticated By: Arnell Sieving, M.D.       MDM    This reports significant improvement of symptoms. Will discharge home with Phenergan patient reports she has tried all at home for pain if she needs it. Reports she will have close followup with Dr. Dulce Sellar in January. CT scans and labs within normal limits. Discharge home advised patient to return for worsening symptoms. Patient agrees with this and is ready for discharge  Medical screening examination/treatment/procedure(s) were performed by non-physician practitioner and as supervising physician I was immediately available for consultation/collaboration. Osvaldo Human, M.D.     [place], [place] 10/21/11 2026  Carleene Cooper III, MD 10/22/11 1030

## 2011-11-03 ENCOUNTER — Emergency Department (HOSPITAL_COMMUNITY): Payer: Medicare Other

## 2011-11-03 ENCOUNTER — Encounter (HOSPITAL_COMMUNITY): Payer: Self-pay

## 2011-11-03 ENCOUNTER — Emergency Department (HOSPITAL_COMMUNITY)
Admission: EM | Admit: 2011-11-03 | Discharge: 2011-11-03 | Disposition: A | Discharge status: Home / Self Care | Payer: Medicare Other | Attending: Emergency Medicine | Admitting: Emergency Medicine

## 2011-11-03 ENCOUNTER — Other Ambulatory Visit: Payer: Self-pay

## 2011-11-03 DIAGNOSIS — T148XXA Other injury of unspecified body region, initial encounter: Secondary | ICD-10-CM | POA: Diagnosis not present

## 2011-11-03 DIAGNOSIS — G40909 Epilepsy, unspecified, not intractable, without status epilepticus: Secondary | ICD-10-CM | POA: Insufficient documentation

## 2011-11-03 DIAGNOSIS — R1084 Generalized abdominal pain: Secondary | ICD-10-CM | POA: Diagnosis not present

## 2011-11-03 DIAGNOSIS — R569 Unspecified convulsions: Secondary | ICD-10-CM | POA: Diagnosis not present

## 2011-11-03 DIAGNOSIS — M25559 Pain in unspecified hip: Secondary | ICD-10-CM

## 2011-11-03 DIAGNOSIS — M25569 Pain in unspecified knee: Secondary | ICD-10-CM | POA: Diagnosis not present

## 2011-11-03 DIAGNOSIS — R112 Nausea with vomiting, unspecified: Secondary | ICD-10-CM | POA: Insufficient documentation

## 2011-11-03 DIAGNOSIS — Z79899 Other long term (current) drug therapy: Secondary | ICD-10-CM | POA: Diagnosis not present

## 2011-11-03 DIAGNOSIS — Y9289 Other specified places as the place of occurrence of the external cause: Secondary | ICD-10-CM | POA: Insufficient documentation

## 2011-11-03 DIAGNOSIS — M79609 Pain in unspecified limb: Secondary | ICD-10-CM | POA: Diagnosis not present

## 2011-11-03 DIAGNOSIS — R51 Headache: Secondary | ICD-10-CM | POA: Insufficient documentation

## 2011-11-03 DIAGNOSIS — E119 Type 2 diabetes mellitus without complications: Secondary | ICD-10-CM | POA: Diagnosis not present

## 2011-11-03 DIAGNOSIS — W19XXXA Unspecified fall, initial encounter: Secondary | ICD-10-CM | POA: Insufficient documentation

## 2011-11-03 DIAGNOSIS — M25469 Effusion, unspecified knee: Secondary | ICD-10-CM | POA: Diagnosis not present

## 2011-11-03 DIAGNOSIS — I10 Essential (primary) hypertension: Secondary | ICD-10-CM | POA: Insufficient documentation

## 2011-11-03 LAB — DIFFERENTIAL
Basophils Relative: 0 % (ref 0–1)
Lymphocytes Relative: 56 % — ABNORMAL HIGH (ref 12–46)
Monocytes Relative: 10 % (ref 3–12)
Neutro Abs: 1.9 10*3/uL (ref 1.7–7.7)
Neutrophils Relative %: 34 % — ABNORMAL LOW (ref 43–77)

## 2011-11-03 LAB — COMPREHENSIVE METABOLIC PANEL
AST: 16 U/L (ref 0–37)
Albumin: 3.4 g/dL — ABNORMAL LOW (ref 3.5–5.2)
Alkaline Phosphatase: 51 U/L (ref 39–117)
BUN: 11 mg/dL (ref 6–23)
CO2: 24 mEq/L (ref 19–32)
Chloride: 102 mEq/L (ref 96–112)
GFR calc non Af Amer: >90 mL/min (ref >90)
Potassium: 3.4 mEq/L — ABNORMAL LOW (ref 3.5–5.1)
Total Bilirubin: 0.2 mg/dL — ABNORMAL LOW (ref 0.3–1.2)

## 2011-11-03 LAB — CBC
HCT: 38.2 % (ref 36.0–46.0)
Hemoglobin: 12.2 g/dL (ref 12.0–15.0)
MCHC: 31.9 g/dL (ref 30.0–36.0)
RBC: 4.87 MIL/uL (ref 3.87–5.11)
WBC: 5.6 10*3/uL (ref 4.0–10.5)

## 2011-11-03 LAB — LIPASE, BLOOD: Lipase: 14 U/L (ref 11–59)

## 2011-11-03 MED ORDER — SODIUM CHLORIDE 0.9 % IV BOLUS (SEPSIS)
1000.0000 mL | Freq: Once | INTRAVENOUS | Status: AC
  Administered 2011-11-03: 1000 mL via INTRAVENOUS

## 2011-11-03 MED ORDER — FENTANYL CITRATE 0.05 MG/ML IJ SOLN: 50.0000 ug | Freq: Once | INTRAMUSCULAR | Status: DC

## 2011-11-03 MED ORDER — ONDANSETRON HCL 4 MG/2ML IJ SOLN
4.0000 mg | Freq: Once | INTRAMUSCULAR | Status: AC
  Administered 2011-11-03: 4 mg via INTRAVENOUS
  Filled 2011-11-03: qty 2

## 2011-11-03 MED ORDER — FENTANYL CITRATE 0.05 MG/ML IJ SOLN
100.0000 ug | Freq: Once | INTRAMUSCULAR | Status: AC
  Administered 2011-11-03: 100 ug via INTRAVENOUS
  Filled 2011-11-03: qty 2

## 2011-11-03 NOTE — ED Provider Notes (Signed)
History     CSN: [id number]  Arrival date & time 11/03/11  1301   First MD Initiated Contact with Patient 11/03/11 1318     1:36 PM HPI Patient is a poor historian. According to EMS she was found in the parking lot for church. Spouse with husband states she was not feeling well and went to walk to the car. Spouse states her last 2 weeks has had a colitis flareup. Reports symptoms continue therefore she's not been eating well. Patient reports pain in her abdomen which is old and her right knee and hip. Patient is unable to fully recall the event. Patient is unable to state her last name. Patient is a 56 y.o. female presenting with fall. The history is provided by the patient.  Fall The accident occurred 1 to 2 hours ago. The fall occurred while walking. There was no blood loss. The pain is moderate. She was not ambulatory at the scene. There was no entrapment after the fall. There was no drug use involved in the accident. There was no alcohol use involved in the accident. Associated symptoms include abdominal pain, nausea, vomiting and headaches. Pertinent negatives include no fever, no numbness and no tingling. Treatment on scene includes a c-collar and a backboard. She has tried immobilization for the symptoms.    Past Medical History  Diagnosis Date  . Hypertension   . Ulcerative colitis   . Migraines   . Seizure disorder   . Diabetes mellitus     Past Surgical History  Procedure Date  . Cholecystectomy   . Abdominal hysterectomy   . Oophrectomy     History reviewed. No pertinent family history.  History  Substance Use Topics  . Smoking status: Never Smoker   . Smokeless tobacco: Not on file  . Alcohol Use: Yes     RARELY    OB History    Grav Para Term Preterm Abortions TAB SAB Ect Mult Living                  Review of Systems  Constitutional: Negative for fever and chills.  HENT: Negative for neck pain and neck stiffness.   Respiratory: Negative for shortness of  breath.   Cardiovascular: Negative for chest pain.  Gastrointestinal: Positive for nausea, vomiting and abdominal pain.  Genitourinary: Negative for dysuria and frequency.  Musculoskeletal: Negative for back pain.       Right hip and knee pain  Neurological: Positive for seizures and headaches. Negative for dizziness, tingling and numbness.  All other systems reviewed and are negative.    Allergies  Aspirin; Darvocet; Dilaudid; Midrin; Penicillins; and Wellbutrin  Home Medications   Current Outpatient Rx  Name Route Sig Dispense Refill  . FLUOXETINE HCL 20 MG PO TABS Oral Take 20 mg by mouth daily.      Marland Kitchen PHENYTOIN SODIUM EXTENDED 100 MG PO CAPS Oral Take by mouth 3 (three) times daily.      Marland Kitchen PROMETHAZINE HCL 25 MG/ML IJ SOLN Intravenous Inject 0.5 mLs (12.5 mg total) into the vein once. 1 mL 0  . TRIAMTERENE 50 MG PO CAPS Oral Take 50 mg by mouth 2 (two) times daily.        BP 126/64  Pulse 95  Temp(Src) 98.4 F (36.9 C) (Oral)  Resp 10  SpO2 100%  Physical Exam  Vitals reviewed. Constitutional: She is oriented to person, place, and time. Vital signs are normal. She appears well-developed and well-nourished.  HENT:  Head: Normocephalic and  atraumatic.  Eyes: Conjunctivae are normal. Pupils are equal, round, and reactive to light.  Neck: Normal range of motion. Neck supple.  Cardiovascular: Normal rate, regular rhythm and normal heart sounds.  Exam reveals no friction rub.   No murmur heard. Pulmonary/Chest: Effort normal and breath sounds normal. She has no wheezes. She has no rhonchi. She has no rales. She exhibits no tenderness.  Abdominal: Normal appearance and bowel sounds are normal. There is no hepatosplenomegaly. There is generalized tenderness. There is no CVA tenderness, no tenderness at McBurney's point and negative Murphy's sign.  Musculoskeletal:       Right hip: She exhibits decreased range of motion and tenderness. She exhibits normal strength, no swelling,  no crepitus, no deformity and no laceration.       Right knee: She exhibits decreased range of motion. She exhibits no swelling, no effusion, no ecchymosis and no laceration. tenderness found. Medial joint line, lateral joint line, MCL, LCL and patellar tendon tenderness noted.  Neurological: She is alert and oriented to person, place, and time. Coordination normal.  Skin: Skin is warm and dry. No rash noted. No erythema. No pallor.    ED Course  Procedures  Results for orders placed during the hospital encounter of 11/03/11  CBC      Component Value Range   WBC 5.6  4.0 - 10.5 (K/uL)   RBC 4.87  3.87 - 5.11 (MIL/uL)   Hemoglobin 12.2  12.0 - 15.0 (g/dL)   HCT 16.1  09.6 - 04.5 (%)   MCV 78.4  78.0 - 100.0 (fL)   MCH 25.1 (*) 26.0 - 34.0 (pg)   MCHC 31.9  30.0 - 36.0 (g/dL)   RDW 40.9 (*) 81.1 - 15.5 (%)   Platelets 296  150 - 400 (K/uL)  DIFFERENTIAL      Component Value Range   Neutrophils Relative 34 (*) 43 - 77 (%)   Neutro Abs 1.9  1.7 - 7.7 (K/uL)   Lymphocytes Relative 56 (*) 12 - 46 (%)   Lymphs Abs 3.1  0.7 - 4.0 (K/uL)   Monocytes Relative 10  3 - 12 (%)   Monocytes Absolute 0.6  0.1 - 1.0 (K/uL)   Eosinophils Relative 0  0 - 5 (%)   Eosinophils Absolute 0.0  0.0 - 0.7 (K/uL)   Basophils Relative 0  0 - 1 (%)   Basophils Absolute 0.0  0.0 - 0.1 (K/uL)  COMPREHENSIVE METABOLIC PANEL      Component Value Range   Sodium 139  135 - 145 (mEq/L)   Potassium 3.4 (*) 3.5 - 5.1 (mEq/L)   Chloride 102  96 - 112 (mEq/L)   CO2 24  19 - 32 (mEq/L)   Glucose, Bld 86  70 - 99 (mg/dL)   BUN 11  6 - 23 (mg/dL)   Creatinine, Ser 9.14  0.50 - 1.10 (mg/dL)   Calcium 9.2  8.4 - 78.2 (mg/dL)   Total Protein 7.7  6.0 - 8.3 (g/dL)   Albumin 3.4 (*) 3.5 - 5.2 (g/dL)   AST 16  0 - 37 (U/L)   ALT 16  0 - 35 (U/L)   Alkaline Phosphatase 51  39 - 117 (U/L)   Total Bilirubin 0.2 (*) 0.3 - 1.2 (mg/dL)   GFR calc non Af Amer >90  >90 (mL/min)   GFR calc Af Amer >90  >90 (mL/min)  LIPASE,  BLOOD      Component Value Range   Lipase 14  11 - 59 (  U/L)  PHENYTOIN LEVEL, TOTAL      Component Value Range   Phenytoin Lvl <2.5 (*) 10.0 - 20.0 (ug/mL)   Dg Hip Complete Right  11/03/2011  *RADIOLOGY REPORT*  Clinical Data: Larey Seat today.  Seizure.  Right lower extremity pain.  RIGHT HIP - COMPLETE 2+ VIEW  Comparison: 10/21/2011  Findings: AP and lateral views of the right hip include an AP view of the pelvis.  There is no evidence for acute fracture or dislocation.  Regional bowel gas pattern is nonobstructive. Scattered pelvic phleboliths are present.  IMPRESSION: No evidence for acute  abnormality.  Original Report Authenticated By: Patterson Hammersmith, M.D.   Ct Head Wo Contrast  11/03/2011  *RADIOLOGY REPORT*  Clinical Data:  Patient found unresponsive.  Altered mental status.  CT HEAD WITHOUT CONTRAST CT CERVICAL SPINE WITHOUT CONTRAST  Technique:  Multidetector CT imaging of the head and cervical spine was performed following the standard protocol without intravenous contrast.  Multiplanar CT image reconstructions of the cervical spine were also generated.  Comparison:  Head CT scan 05/07/2011 and had and cervical spine CT scans 09/22/2006.  CT HEAD  Findings: There is no evidence of acute intracranial abnormality including acute infarction, hemorrhage, mass lesion, mass effect, midline shift or abnormal extra-axial fluid collection.  No hydrocephalus or pneumocephalus.  Calvarium is intact.  IMPRESSION: Negative head CT.  CT CERVICAL SPINE  Findings: No fracture or subluxation of the cervical spine is identified.  The patient has degenerative disc disease most notable at C5-6 and C6-7.  Lung apices are clear.  IMPRESSION: No acute finding.  Degenerative disc disease C5-6 and C6-7.  Original Report Authenticated By: Bernadene Bell. Maricela Curet, M.D.   Ct Cervical Spine Wo Contrast  11/03/2011  *RADIOLOGY REPORT*  Clinical Data:  Patient found unresponsive.  Altered mental status.  CT HEAD WITHOUT  CONTRAST CT CERVICAL SPINE WITHOUT CONTRAST  Technique:  Multidetector CT imaging of the head and cervical spine was performed following the standard protocol without intravenous contrast.  Multiplanar CT image reconstructions of the cervical spine were also generated.  Comparison:  Head CT scan 05/07/2011 and had and cervical spine CT scans 09/22/2006.  CT HEAD  Findings: There is no evidence of acute intracranial abnormality including acute infarction, hemorrhage, mass lesion, mass effect, midline shift or abnormal extra-axial fluid collection.  No hydrocephalus or pneumocephalus.  Calvarium is intact.  IMPRESSION: Negative head CT.  CT CERVICAL SPINE  Findings: No fracture or subluxation of the cervical spine is identified.  The patient has degenerative disc disease most notable at C5-6 and C6-7.  Lung apices are clear.  IMPRESSION: No acute finding.  Degenerative disc disease C5-6 and C6-7.  Original Report Authenticated By: Bernadene Bell. Maricela Curet, M.D.   Ct Abdomen Pelvis W Contrast  10/21/2011  *RADIOLOGY REPORT*  Clinical Data: Pain with vomiting.  History of colitis.  CT ABDOMEN AND PELVIS WITH CONTRAST  Technique:  Multidetector CT imaging of the abdomen and pelvis was performed following the standard protocol during bolus administration of intravenous contrast.  Contrast: OMNIPAQUE IOHEXOL 300 MG/ML IV SOLN  Comparison: CT scan dated 05/07/2011 and radiographs dated 10/20/2000  Findings: The liver, spleen, pancreas, adrenal glands, and kidneys demonstrate no significant abnormalities.  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  Gallbladder has been removed.  Bile ducts and pancreatic duct are less prominent than on the prior study.  There are a few diverticula in the distal descending and proximal sigmoid portions  of the colon but there is no acute inflammation. Uterus and ovaries and appendix appear to have been removed.  IMPRESSION: No acute abnormalities.  Original  Report Authenticated By: Gwynn Burly, M.D.   Dg Knee Complete 4 Views Right  11/03/2011  *RADIOLOGY REPORT*  Clinical Data: Larey Seat today.  Seizure.  Right lower extremity pain. Knee swelling.  Limited range of motion.  RIGHT KNEE - COMPLETE 4+ VIEW  Comparison: 09/22/2006.  Findings: Marked degenerative changes are identified involving medial, lateral, and patellofemoral compartments.  No evidence for joint effusion.  No evidence for acute fracture.  IMPRESSION: Marked dry compartmental degenerative change. No evidence for acute abnormality.  Original Report Authenticated By: Patterson Hammersmith, M.D.   Dg Abd 2 Views  10/21/2011  *RADIOLOGY REPORT*  Clinical Data: Diffuse abdominal pain.  Diarrhea.  History of colitis.  ABDOMEN - 2 VIEW 10/21/2011:  Comparison: Acute abdomen series 05/06/2011.  CT abdomen pelvis 05/07/2011.  Findings: Bowel gas pattern nonobstructive.  No evidence of significant ileus.  No evidence of free air or significant air fluid levels on the erect image.  Large stool burden.  Suggestion of pneumatosis involving the mid descending colon, though this may be be artifactual and related to a gas surrounding a stool ball in this location phleboliths in the pelvis.  No visible opaque urinary tract calculi.  Degenerative changes involving lower lumbar spine.  IMPRESSION: Large stool burden.  Possible pneumatosis involving the mid descending colon, though this may be artifactual and related to gas surrounding a stool ball in this location.  Is there left mid abdominal tenderness?  CT may be confirmatory.  Original Report Authenticated By: Arnell Sieving, M.D.     MDM   4:07 PM Spoke with Dr. Dulce Sellar. He states he can followup with patient in the next couple of days. Recommends patient give a call to the office tomorrow to schedule an appointment. Will discharge patient after medication received. Discussed this with patient. She agrees with plan.    Abe People,  PA-C 11/03/11 1608

## 2011-11-03 NOTE — ED Notes (Signed)
Pt waiting for husband to return receiving fluid bolus aware of plan

## 2011-11-03 NOTE — Progress Notes (Signed)
Orthopedic Tech Progress Note Patient Details:  Erin Wolf 05-23-1956 [id number]  Other Ortho Devices Type of Ortho Device: Knee Sleeve Ortho Device Location: right knee Ortho Device Interventions: Application   Nikki Dom 11/03/2011, 4:55 PM

## 2011-11-03 NOTE — ED Notes (Addendum)
Pt to ed via [facility], pt at church, pt was found unresponsive in the parking lot, pt alert to self on ems arrival, pt does not remember falling, need constant re orientation to event and why she is here, pt had a questionable seizure or syncopal episode x3 en route to ed assoc w/expressive aphasia, R leg has medial rotation w/decreased sensation, pain localized lateral/medial aspect just above R knee. Pt's bp decreased to 59/40 en route. 20 g LFA

## 2011-11-03 NOTE — ED Notes (Signed)
wc to care in care of husband . Pt alert x3 mae randomly

## 2011-11-03 NOTE — ED Notes (Signed)
Ortho tech paged  

## 2011-11-04 NOTE — ED Provider Notes (Signed)
Medical screening examination/treatment/procedure(s) were conducted as a shared visit with non-physician practitioner(s) and myself.  I personally evaluated the patient during the encounter Pt with hx UC, states has had recent diarrhea, no bloody bms, no recent abx use. Also notes palpitations earlier, hx same. No dysrhythmia in ed. abd soft nt. Labs.  PA to contact pts gi md re recent symptoms, tx, follow up  Suzi Roots, MD 11/04/11 1040

## 2011-11-14 DIAGNOSIS — K5190 Ulcerative colitis, unspecified, without complications: Secondary | ICD-10-CM | POA: Diagnosis not present

## 2011-11-14 DIAGNOSIS — R110 Nausea: Secondary | ICD-10-CM | POA: Diagnosis not present

## 2011-11-14 DIAGNOSIS — E785 Hyperlipidemia, unspecified: Secondary | ICD-10-CM | POA: Diagnosis not present

## 2011-11-14 DIAGNOSIS — M545 Low back pain: Secondary | ICD-10-CM | POA: Diagnosis not present

## 2011-11-14 DIAGNOSIS — I10 Essential (primary) hypertension: Secondary | ICD-10-CM | POA: Diagnosis not present

## 2011-11-14 DIAGNOSIS — R569 Unspecified convulsions: Secondary | ICD-10-CM | POA: Diagnosis not present

## 2011-12-31 DIAGNOSIS — R0602 Shortness of breath: Secondary | ICD-10-CM | POA: Diagnosis not present

## 2011-12-31 DIAGNOSIS — R079 Chest pain, unspecified: Secondary | ICD-10-CM | POA: Diagnosis not present

## 2011-12-31 DIAGNOSIS — R55 Syncope and collapse: Secondary | ICD-10-CM | POA: Diagnosis not present

## 2012-01-03 ENCOUNTER — Emergency Department (HOSPITAL_COMMUNITY): Payer: Medicare Other

## 2012-01-03 ENCOUNTER — Inpatient Hospital Stay (HOSPITAL_COMMUNITY)
Admission: EM | Admit: 2012-01-03 | Discharge: 2012-01-07 | DRG: 312 | Disposition: A | Discharge status: Home / Self Care | Payer: Medicare Other | Source: Ambulatory Visit | Attending: Internal Medicine | Admitting: Internal Medicine

## 2012-01-03 ENCOUNTER — Other Ambulatory Visit: Payer: Self-pay

## 2012-01-03 ENCOUNTER — Encounter (HOSPITAL_COMMUNITY): Payer: Self-pay | Admitting: Emergency Medicine

## 2012-01-03 DIAGNOSIS — M549 Dorsalgia, unspecified: Secondary | ICD-10-CM | POA: Diagnosis present

## 2012-01-03 DIAGNOSIS — R079 Chest pain, unspecified: Secondary | ICD-10-CM | POA: Diagnosis present

## 2012-01-03 DIAGNOSIS — R748 Abnormal levels of other serum enzymes: Secondary | ICD-10-CM | POA: Diagnosis not present

## 2012-01-03 DIAGNOSIS — D219 Benign neoplasm of connective and other soft tissue, unspecified: Secondary | ICD-10-CM | POA: Insufficient documentation

## 2012-01-03 DIAGNOSIS — R0600 Dyspnea, unspecified: Secondary | ICD-10-CM | POA: Diagnosis present

## 2012-01-03 DIAGNOSIS — F329 Major depressive disorder, single episode, unspecified: Secondary | ICD-10-CM | POA: Diagnosis present

## 2012-01-03 DIAGNOSIS — R7989 Other specified abnormal findings of blood chemistry: Secondary | ICD-10-CM | POA: Diagnosis not present

## 2012-01-03 DIAGNOSIS — R932 Abnormal findings on diagnostic imaging of liver and biliary tract: Secondary | ICD-10-CM | POA: Diagnosis not present

## 2012-01-03 DIAGNOSIS — G40909 Epilepsy, unspecified, not intractable, without status epilepticus: Secondary | ICD-10-CM | POA: Diagnosis present

## 2012-01-03 DIAGNOSIS — R55 Syncope and collapse: Secondary | ICD-10-CM | POA: Diagnosis not present

## 2012-01-03 DIAGNOSIS — D649 Anemia, unspecified: Secondary | ICD-10-CM | POA: Diagnosis present

## 2012-01-03 DIAGNOSIS — I10 Essential (primary) hypertension: Secondary | ICD-10-CM | POA: Diagnosis present

## 2012-01-03 DIAGNOSIS — R0602 Shortness of breath: Secondary | ICD-10-CM | POA: Diagnosis present

## 2012-01-03 DIAGNOSIS — Z8669 Personal history of other diseases of the nervous system and sense organs: Secondary | ICD-10-CM | POA: Insufficient documentation

## 2012-01-03 DIAGNOSIS — R197 Diarrhea, unspecified: Secondary | ICD-10-CM | POA: Diagnosis not present

## 2012-01-03 DIAGNOSIS — R569 Unspecified convulsions: Secondary | ICD-10-CM | POA: Diagnosis not present

## 2012-01-03 DIAGNOSIS — B1920 Unspecified viral hepatitis C without hepatic coma: Secondary | ICD-10-CM | POA: Diagnosis present

## 2012-01-03 DIAGNOSIS — R7401 Elevation of levels of liver transaminase levels: Secondary | ICD-10-CM

## 2012-01-03 DIAGNOSIS — K7689 Other specified diseases of liver: Secondary | ICD-10-CM | POA: Diagnosis not present

## 2012-01-03 DIAGNOSIS — K5190 Ulcerative colitis, unspecified, without complications: Secondary | ICD-10-CM | POA: Diagnosis not present

## 2012-01-03 DIAGNOSIS — F3289 Other specified depressive episodes: Secondary | ICD-10-CM | POA: Diagnosis present

## 2012-01-03 DIAGNOSIS — G8929 Other chronic pain: Secondary | ICD-10-CM | POA: Diagnosis present

## 2012-01-03 DIAGNOSIS — R109 Unspecified abdominal pain: Secondary | ICD-10-CM | POA: Diagnosis not present

## 2012-01-03 DIAGNOSIS — F32A Depression, unspecified: Secondary | ICD-10-CM | POA: Diagnosis present

## 2012-01-03 DIAGNOSIS — R1084 Generalized abdominal pain: Secondary | ICD-10-CM | POA: Diagnosis not present

## 2012-01-03 DIAGNOSIS — F341 Dysthymic disorder: Secondary | ICD-10-CM | POA: Insufficient documentation

## 2012-01-03 DIAGNOSIS — E785 Hyperlipidemia, unspecified: Secondary | ICD-10-CM | POA: Diagnosis present

## 2012-01-03 DIAGNOSIS — J9819 Other pulmonary collapse: Secondary | ICD-10-CM | POA: Diagnosis not present

## 2012-01-03 HISTORY — DX: Hyperlipidemia, unspecified: E78.5

## 2012-01-03 HISTORY — DX: Depression, unspecified: F32.A

## 2012-01-03 HISTORY — DX: Major depressive disorder, single episode, unspecified: F32.9

## 2012-01-03 HISTORY — DX: Essential (primary) hypertension: I10

## 2012-01-03 LAB — CBC
HCT: 41.3 % (ref 36.0–46.0)
HCT: 35.4 % — ABNORMAL LOW (ref 36.0–46.0)
Hemoglobin: 13 g/dL (ref 12.0–15.0)
Hemoglobin: 11.3 g/dL — ABNORMAL LOW (ref 12.0–15.0)
MCH: 24.9 pg — ABNORMAL LOW (ref 26.0–34.0)
MCH: 25.1 pg — ABNORMAL LOW (ref 26.0–34.0)
MCHC: 31.5 g/dL (ref 30.0–36.0)
MCHC: 31.9 g/dL (ref 30.0–36.0)
MCV: 78.5 fL (ref 78.0–100.0)
RBC: 4.51 MIL/uL (ref 3.87–5.11)

## 2012-01-03 LAB — COMPREHENSIVE METABOLIC PANEL
BUN: 15 mg/dL (ref 6–23)
Calcium: 9.8 mg/dL (ref 8.4–10.5)
Creatinine, Ser: 0.62 mg/dL (ref 0.50–1.10)
GFR calc Af Amer: >90 mL/min (ref >90)
Glucose, Bld: 94 mg/dL (ref 70–99)
Total Protein: 8.6 g/dL — ABNORMAL HIGH (ref 6.0–8.3)

## 2012-01-03 LAB — URINALYSIS, ROUTINE W REFLEX MICROSCOPIC
Bilirubin Urine: NEGATIVE
Hgb urine dipstick: NEGATIVE
Protein, ur: NEGATIVE mg/dL
Urobilinogen, UA: 0.2 mg/dL (ref 0.0–1.0)

## 2012-01-03 LAB — DIFFERENTIAL
Basophils Relative: 0 % (ref 0–1)
Eosinophils Absolute: 0 10*3/uL (ref 0.0–0.7)
Monocytes Absolute: 0.6 10*3/uL (ref 0.1–1.0)
Monocytes Relative: 9 % (ref 3–12)

## 2012-01-03 LAB — PROTIME-INR
INR: 0.96 (ref 0.00–1.49)
Prothrombin Time: 13 seconds (ref 11.6–15.2)

## 2012-01-03 LAB — CK TOTAL AND CKMB (NOT AT ARMC)
CK, MB: 1.1 ng/mL (ref 0.3–4.0)
Relative Index: INVALID (ref 0.0–2.5)

## 2012-01-03 LAB — MAGNESIUM: Magnesium: 2.2 mg/dL (ref 1.5–2.5)

## 2012-01-03 LAB — APTT
aPTT: 35 seconds (ref 24–37)
aPTT: 35 seconds (ref 24–37)

## 2012-01-03 LAB — LIPASE, BLOOD: Lipase: 27 U/L (ref 11–59)

## 2012-01-03 MED ORDER — MORPHINE SULFATE 2 MG/ML IJ SOLN
2.0000 mg | INTRAMUSCULAR | Status: DC | PRN
  Administered 2012-01-03 – 2012-01-07 (×7): 2 mg via INTRAVENOUS
  Filled 2012-01-03 (×8): qty 1

## 2012-01-03 MED ORDER — FENTANYL CITRATE 0.05 MG/ML IJ SOLN
100.0000 ug | Freq: Once | INTRAMUSCULAR | Status: AC
  Administered 2012-01-03: 100 ug via INTRAVENOUS
  Filled 2012-01-03: qty 2

## 2012-01-03 MED ORDER — NITROGLYCERIN 0.4 MG SL SUBL
0.4000 mg | SUBLINGUAL_TABLET | Freq: Once | SUBLINGUAL | Status: AC
  Administered 2012-01-03: 0.4 mg via SUBLINGUAL
  Filled 2012-01-03: qty 25

## 2012-01-03 MED ORDER — SODIUM CHLORIDE 0.9 % IV BOLUS (SEPSIS)
1000.0000 mL | Freq: Once | INTRAVENOUS | Status: AC
  Administered 2012-01-03: 1000 mL via INTRAVENOUS

## 2012-01-03 MED ORDER — ACETAMINOPHEN-CODEINE #3 300-30 MG PO TABS
1.0000 | ORAL_TABLET | Freq: Four times a day (QID) | ORAL | Status: DC | PRN
  Administered 2012-01-07 (×2): 1 via ORAL
  Filled 2012-01-03 (×2): qty 1

## 2012-01-03 MED ORDER — MORPHINE SULFATE 4 MG/ML IJ SOLN
6.0000 mg | Freq: Once | INTRAMUSCULAR | Status: AC
  Administered 2012-01-03: 6 mg via INTRAVENOUS
  Filled 2012-01-03: qty 2

## 2012-01-03 MED ORDER — ACETAMINOPHEN 650 MG RE SUPP: 650.0000 mg | Freq: Four times a day (QID) | RECTAL | Status: DC | PRN

## 2012-01-03 MED ORDER — ROWASA 4 G RE KIT
4.0000 g | PACK | Freq: Every day | RECTAL | Status: DC | PRN
  Filled 2012-01-03 (×2): qty 1

## 2012-01-03 MED ORDER — NITROGLYCERIN IN D5W 200-5 MCG/ML-% IV SOLN
1.0000 ug/min | Freq: Once | INTRAVENOUS | Status: AC
  Administered 2012-01-03: 1 ug/min via INTRAVENOUS

## 2012-01-03 MED ORDER — POTASSIUM CHLORIDE 20 MEQ/15ML (10%) PO LIQD
40.0000 meq | Freq: Once | ORAL | Status: AC
  Administered 2012-01-03: 40 meq via ORAL
  Filled 2012-01-03: qty 30

## 2012-01-03 MED ORDER — TRIAMTERENE-HCTZ 37.5-25 MG PO TABS
1.0000 | ORAL_TABLET | Freq: Every day | ORAL | Status: DC
  Administered 2012-01-04 – 2012-01-07 (×4): 1 via ORAL
  Filled 2012-01-03 (×4): qty 1

## 2012-01-03 MED ORDER — NITROGLYCERIN IN D5W 200-5 MCG/ML-% IV SOLN
2.0000 ug/min | Freq: Once | INTRAVENOUS | Status: AC
  Administered 2012-01-03: 5 ug/min via INTRAVENOUS
  Filled 2012-01-03: qty 250

## 2012-01-03 MED ORDER — PNEUMOCOCCAL 13-VAL CONJ VACC IM SUSP
0.5000 mL | INTRAMUSCULAR | Status: AC
  Administered 2012-01-04: 0.5 mL via INTRAMUSCULAR
  Filled 2012-01-03: qty 0.5

## 2012-01-03 MED ORDER — NITROGLYCERIN 0.3 MG SL SUBL: 0.3000 mg | SUBLINGUAL_TABLET | SUBLINGUAL | Status: DC | PRN

## 2012-01-03 MED ORDER — SENNOSIDES-DOCUSATE SODIUM 8.6-50 MG PO TABS
1.0000 | ORAL_TABLET | Freq: Every evening | ORAL | Status: DC | PRN
  Filled 2012-01-03: qty 1

## 2012-01-03 MED ORDER — FENTANYL CITRATE 0.05 MG/ML IJ SOLN
INTRAMUSCULAR | Status: AC
  Administered 2012-01-03: 15:00:00
  Filled 2012-01-03: qty 2

## 2012-01-03 MED ORDER — SODIUM CHLORIDE 0.9 % IV SOLN
INTRAVENOUS | Status: AC
  Administered 2012-01-03: 21:00:00 via INTRAVENOUS

## 2012-01-03 MED ORDER — CYCLOBENZAPRINE HCL 10 MG PO TABS
10.0000 mg | ORAL_TABLET | Freq: Every day | ORAL | Status: DC
  Administered 2012-01-03 – 2012-01-06 (×4): 10 mg via ORAL
  Filled 2012-01-03 (×6): qty 1

## 2012-01-03 MED ORDER — ALUM & MAG HYDROXIDE-SIMETH 200-200-20 MG/5ML PO SUSP: 30.0000 mL | Freq: Four times a day (QID) | ORAL | Status: DC | PRN

## 2012-01-03 MED ORDER — ENOXAPARIN SODIUM 40 MG/0.4ML ~~LOC~~ SOLN
40.0000 mg | Freq: Every day | SUBCUTANEOUS | Status: DC
  Administered 2012-01-03 – 2012-01-05 (×2): 40 mg via SUBCUTANEOUS
  Filled 2012-01-03 (×4): qty 0.4

## 2012-01-03 MED ORDER — NITROGLYCERIN 0.4 MG SL SUBL: 0.4000 mg | SUBLINGUAL_TABLET | SUBLINGUAL | Status: DC | PRN

## 2012-01-03 MED ORDER — ONDANSETRON HCL 4 MG PO TABS
4.0000 mg | ORAL_TABLET | Freq: Four times a day (QID) | ORAL | Status: DC | PRN
Start: 2012-01-03 — End: 2012-01-04

## 2012-01-03 MED ORDER — PANTOPRAZOLE SODIUM 40 MG PO TBEC
40.0000 mg | DELAYED_RELEASE_TABLET | Freq: Every day | ORAL | Status: DC
  Administered 2012-01-04 – 2012-01-07 (×4): 40 mg via ORAL
  Filled 2012-01-03 (×4): qty 1

## 2012-01-03 MED ORDER — PHENYTOIN SODIUM EXTENDED 100 MG PO CAPS
100.0000 mg | ORAL_CAPSULE | Freq: Four times a day (QID) | ORAL | Status: DC
  Administered 2012-01-03: 100 mg via ORAL
  Filled 2012-01-03 (×5): qty 1

## 2012-01-03 MED ORDER — MESALAMINE 4 G RE ENEM
4.0000 g | ENEMA | Freq: Every day | RECTAL | Status: DC | PRN
  Filled 2012-01-03: qty 60

## 2012-01-03 MED ORDER — ACETAMINOPHEN 325 MG PO TABS: 650.0000 mg | ORAL_TABLET | Freq: Four times a day (QID) | ORAL | Status: DC | PRN

## 2012-01-03 MED ORDER — SODIUM CHLORIDE 0.9 % IJ SOLN
3.0000 mL | Freq: Two times a day (BID) | INTRAMUSCULAR | Status: DC
  Administered 2012-01-04 – 2012-01-07 (×7): 3 mL via INTRAVENOUS

## 2012-01-03 MED ORDER — FLUOXETINE HCL 20 MG PO CAPS
20.0000 mg | ORAL_CAPSULE | Freq: Two times a day (BID) | ORAL | Status: DC
  Administered 2012-01-03 – 2012-01-07 (×8): 20 mg via ORAL
  Filled 2012-01-03 (×9): qty 1

## 2012-01-03 MED ORDER — LORAZEPAM 2 MG/ML IJ SOLN
INTRAMUSCULAR | Status: AC
  Administered 2012-01-03: 2 mg
  Filled 2012-01-03: qty 1

## 2012-01-03 MED ORDER — ONDANSETRON HCL 4 MG/2ML IJ SOLN
4.0000 mg | Freq: Four times a day (QID) | INTRAMUSCULAR | Status: DC | PRN
Start: 2012-01-03 — End: 2012-01-04

## 2012-01-03 MED ORDER — PROMETHAZINE HCL 25 MG/ML IJ SOLN
25.0000 mg | Freq: Once | INTRAMUSCULAR | Status: AC
  Administered 2012-01-03: 25 mg via INTRAVENOUS
  Filled 2012-01-03 (×2): qty 1

## 2012-01-03 MED ORDER — SODIUM CHLORIDE 0.9 % IV SOLN
INTRAVENOUS | Status: DC
  Administered 2012-01-03: 16:00:00 via INTRAVENOUS

## 2012-01-03 NOTE — ED Notes (Signed)
Patient transported to CT 

## 2012-01-03 NOTE — ED Notes (Signed)
abd pain n/v and chest pain since last night

## 2012-01-03 NOTE — ED Notes (Signed)
Pt returned from radiology.

## 2012-01-03 NOTE — H&P (Signed)
Erin Wolf MRN: [id number] DOB/AGE: 11-17-1955 56 y.o. Primary Care Physician:HARRIS, Erin Noa, MD, MD Admit date: 01/03/2012 Chief Complaint: Syncope and collapse HPI:  Erin Wolf is a 56 year old obese African American female with history of hypertension, ulcerative colitis, depression, seizure disorder who presents from her cardiologist's office with a syncopal episode. Patient states that she's had 3 episodes of syncope recently preceded by some dizziness and some associated palpitations. Patient stated that she also does endorse some chest tightness as well which is nonradiating. Patient states that usually after her syncopal episodes she remains confused for a while. Patient does endorse some generalized shortness of breath and fatigue. Patient presented to her cardiologist's office on the day of admission was on the way to get an echo walking down the hallway when she stated that she passed out and she felt a little bit dizzy prior to passing out. Patient was subsequently sent to the ED. Patient is also complaining of a three-day history of diarrhea which has mucus in nature nonbloody with some abdominal cramping nausea and episode of emesis. Patient does endorse some chills. Patient denies any fever, no cough, no dysuria. Patient also complaining of occasional intermittent left lower extremity pain shooting down her leg. Patient was seen in the ED be met which was obtained showed a potassium of 3.3 otherwise within normal limits. Chest x-ray was done which was negative. Head CT was not done. Abdominal x-rays was not done. First set of troponin was negative. CBC which was obtained was unremarkable. Will call to admit the patient for further evaluation and management.  Past Medical History  Diagnosis Date  . Hypertension   . Ulcerative colitis   . Migraines   . Seizure disorder   . HTN (hypertension), benign 01/03/2012  . Hx of migraines 01/03/2012  . Seizure 01/03/2012  . Depression  01/03/2012  . Chronic back pain 01/03/2012  . 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 Hyperlipidemia 01/03/2012  . Hepatitis C 01/03/2012  . Fibroids 01/03/2012    Past Surgical History  Procedure Date  . Cholecystectomy   . Abdominal hysterectomy   . Oophrectomy     Prior to Admission medications   Medication Sig Start Date End Date Taking? Authorizing Provider  acetaminophen-codeine (TYLENOL #3) 300-30 MG per tablet Take 1 tablet by mouth every 6 (six) hours as needed. For pain   Yes Historical Provider, MD  cyclobenzaprine (FLEXERIL) 10 MG tablet Take 10 mg by mouth at bedtime.   Yes Historical Provider, MD  FLUoxetine (PROZAC) 20 MG tablet Take 20 mg by mouth 2 (two) times daily.    Yes Historical Provider, MD  Mesalamine-Cleanser (ROWASA) 4 G KIT Place 4 g rectally daily as needed. colitis   Yes Historical Provider, MD  phenytoin (DILANTIN) 100 MG ER capsule Take 100 mg by mouth 4 (four) times daily.    Yes Historical Provider, MD  triamterene-hydrochlorothiazide (MAXZIDE-25) 37.5-25 MG per tablet Take 1 tablet by mouth every morning.   Yes Historical Provider, MD  promethazine (PHENERGAN) 25 MG tablet Take 25 mg by mouth every 6 (six) hours as needed. For nausea    Historical Provider, MD    Allergies:  Allergies  Allergen Reactions  . Aspirin Nausea Only  . Darvocet (Propoxyphene N-Acetaminophen) Nausea And Vomiting  . Dilaudid (Hydromorphone Hcl) Itching and Nausea Only  . Midrin Nausea Only  . Penicillins Hives  . Wellbutrin (Bupropion Hcl) Other (See Comments)    Insomnia and headaches    History reviewed. No pertinent family history.  Social History:  reports that she has never smoked. She does not have any smokeless tobacco history on file. She reports that she drinks alcohol. She reports that she does not use illicit drugs.  ROS: All systems reviewed with the patient and was positive as per HPI otherwise all other systems are negative.  PHYSICAL EXAM: Blood pressure  111/75, pulse 104, temperature 97.4 F (36.3 C), temperature source Oral, resp. rate 16, SpO2 98.00%. General: Well-developed well-nourished no acute cardiopulmonary distress.  HEENT: Normocephalic atraumatic. Pupils equal round and reactive to light and accommodation. Extraocular movements intact. Oropharynx is clear, no lesions, no exudates. Neck is supple no lymphadenopathy. No bruits, no goiter. Heart: Regular rate and rhythm, without murmurs, rubs, gallops. Lungs: Clear to auscultation bilaterally. Abdomen: Soft, nontender, nondistended, positive bowel sounds. Extremities: No clubbing cyanosis or edema with positive pedal pulses. Neuro: Alert and or to x3. Cranial nerves II through XII are grossly intact. No focal abnormalities.     EKG: Normal sinus rhythm with nonspecific T-wave abnormality.  No results found for this or any previous visit (from the past 240 hour(s)).   Lab results:  [facility] 01/03/12 1139  NA 136  K 3.3*  CL 95*  CO2 27  GLUCOSE 94  BUN 15  CREATININE 0.62  CALCIUM 9.8  MG --  PHOS --    Basename 01/03/12 1139  AST 21  ALT 20  ALKPHOS 51  BILITOT 0.2*  PROT 8.6*  ALBUMIN 3.9    Basename 01/03/12 1322  LIPASE 27  AMYLASE --    Basename 01/03/12 1139  WBC 6.7  NEUTROABS 2.5  HGB 13.0  HCT 41.3  MCV 79.0  PLT 364    Basename 01/03/12 1322 01/03/12 1139  CKTOTAL 86 --  CKMB 1.1 --  CKMBINDEX -- --  TROPONINI -- <0.30   No components found with this basename: POCBNP:3 No results found for this basename: DDIMER in the last 72 hours No results found for this basename: HGBA1C:2 in the last 72 hours No results found for this basename: CHOL:2,HDL:2,LDLCALC:2,TRIG:2,CHOLHDL:2,LDLDIRECT:2 in the last 72 hours No results found for this basename: TSH,T4TOTAL,FREET3,T3FREE,THYROIDAB in the last 72 hours No results found for this basename: VITAMINB12:2,FOLATE:2,FERRITIN:2,TIBC:2,IRON:2,RETICCTPCT:2 in the last 72 hours Imaging results:  Dg  Chest 2 View  01/03/2012  *RADIOLOGY REPORT*  Clinical Data: Chest pain and shortness of breath.  CHEST - 2 VIEW  Comparison: 01/23/2011.  Findings: The cardiac silhouette, mediastinal and hilar contours are within normal limits and stable.  Slightly low lung volumes with mild vascular crowding but no infiltrates, edema or effusions. The bony thorax is intact.  IMPRESSION: Slightly low lung volumes with mild vascular crowding and atelectasis but no infiltrates, edema or effusions.  Original Report Authenticated By: P. Loralie Champagne, M.D.   Impression/Plan:  Principal Problem:  *Syncope and collapse Active Problems:  Chest pain  SOB (shortness of breath)  HTN (hypertension), benign  Hx of migraines  Seizure  Depression  Ulcerative colitis  Chronic back pain  Hyperlipidemia   #1 syncope and collapse Differential includes cardiogenic versus neurological versus orthostasis. Will check a CT of the head. Will check carotid Dopplers. Will check orthostatics. Will cycle cardiac enzymes every 8 hours x3. Will check a 2-D echo. Check EEG. We'll place on telemetry. And monitor. If patient rules in or 2-D echo is abnormal will likely need a cardiac consultation.  #2 chest pain Atypical in nature. We'll cycle cardiac enzymes every 8 hours x3. Will check a 2-D echo. We'll check a  fasting lipid panel. Will continue nitroglycerin for now. The patient rules in will need to cardiology evaluation. Will check a d-dimer. If positive will need a CT and showed to rule out PE.  #3 shortness of breath Chest x-ray is negative. Will check a d-dimer. Will cycle cardiac enzymes every 8 hours x3. Will check a 2-D echo. Place on oxygen. Will follow.  #4 history of seizures Continue home dose Dilantin. Check a Dilantin level. Will check EEG.  #5 history of ulcerative colitis Patient has been having 3 days of mucous diarrhea, with some abdominal cramping and some nausea and emesis. Will consult with GI for further  evaluation and management.  #6 hyperlipidemia Check a fasting lipid panel.  #7 prophylaxis PPI for GI prophylaxis. Lovenox for DVT prophylaxis.   Aleksa Collinsworth 01/03/2012, 6:57 PM

## 2012-01-03 NOTE — Plan of Care (Signed)
Problem: Phase I Progression Outcomes Goal: Aspirin unless contraindicated Outcome: Not Met (add Reason) Pt allergic to aspirin.     

## 2012-01-03 NOTE — ED Notes (Signed)
Dr. Janee Morn spoke with pt.  Pt is not having any more CP.  CP 0/10 at this time and pt feels fine. Order received to stop nitroglycerin drip per Dr. Janee Morn

## 2012-01-03 NOTE — ED Notes (Signed)
Pt report given and care endorsed to Desma Paganini, RN

## 2012-01-03 NOTE — Progress Notes (Signed)
Called ED for report. Nurse is busy. Will call back.

## 2012-01-03 NOTE — ED Notes (Signed)
Attempt to call report to [phone] 

## 2012-01-03 NOTE — ED Notes (Signed)
Patient transported to X-ray 

## 2012-01-03 NOTE — ED Provider Notes (Signed)
History     CSN: [id number]  Arrival date & time 01/03/12  1129   First MD Initiated Contact with Patient 01/03/12 1233      Chief Complaint  Patient presents with  . Chest Pain    (Consider location/radiation/quality/duration/timing/severity/associated sxs/prior treatment) Patient is a 56 y.o. female presenting with syncope, chest pain, and abdominal pain. The history is provided by the patient and medical records.  Loss of Consciousness This is a recurrent problem. The current episode started 1 to 2 hours ago (The patient was in the cardiology office walking to get an echocardiogram for evaluation of recurrent palpitations when she had chest pain and a syncopal episode.  Chest pain persists in ED.). Associated symptoms include chest pain, abdominal pain and shortness of breath. Pertinent negatives include no headaches.  Chest Pain The chest pain began 1 - 2 weeks ago. Duration of episode(s) is 4 hours. Chest pain occurs constantly. The chest pain is unchanged. Associated with: nothing. At its most intense, the pain is at 8/10. The pain is currently at 6/10. The quality of the pain is described as dull, heavy and pressure-like. The pain does not radiate. Exacerbated by: nothing. Primary symptoms include syncope, shortness of breath, palpitations, abdominal pain and nausea. Pertinent negatives for primary symptoms include no fever, no fatigue, no cough, no wheezing, no vomiting and no dizziness.  There was loss of consciousness. The episode was witnessed. Before the onset of the syncopal episode there was weakness and nausea. There was no visual change, dizziness or vertigo. Precipitated by: none reported. Syncope circumstances: while walking. The syncopal episode occurred with palpitations and shortness of breath. The syncopal episode did not occur with lower back pain, headaches, speech change, focal sensory loss or focal weakness. There was no post-event confusion. There was no urinary  incontinence with syncope.  The palpitations also occurred with shortness of breath. The palpitations did not occur with dizziness.  Associated symptoms include weakness.  Pertinent negatives for associated symptoms include no claudication, no diaphoresis, no lower extremity edema, no numbness, no orthopnea and no paroxysmal nocturnal dyspnea. She tried nitroglycerin, oxygen and narcotics for the symptoms. Past medical history comments: ulcerative colitis    Abdominal Pain The primary symptoms of the illness include abdominal pain, shortness of breath, nausea and diarrhea. The primary symptoms of the illness do not include fever, fatigue, vomiting, hematemesis, hematochezia, dysuria, vaginal discharge or vaginal bleeding. The current episode started more than 2 days ago. The onset of the illness was gradual. The problem has not changed since onset. The pain came on gradually. The abdominal pain has been unchanged since its onset. The abdominal pain is generalized. The abdominal pain does not radiate. The severity of the abdominal pain is 4/10. The abdominal pain is relieved by nothing. The abdominal pain is exacerbated by eating (palpation).  The diarrhea is mucous. The diarrhea occurs 2 to 4 times per day.  The patient has had a change in bowel habit. Symptoms associated with the illness do not include chills, anorexia, diaphoresis, heartburn, constipation, urgency, hematuria, frequency or back pain. Significant associated medical issues include inflammatory bowel disease.    Past Medical History  Diagnosis Date  . Hypertension   . Ulcerative colitis   . Migraines   . Seizure disorder   . Diabetes mellitus     Past Surgical History  Procedure Date  . Cholecystectomy   . Abdominal hysterectomy   . Oophrectomy     No family history on file.  History  Substance Use Topics  . Smoking status: Never Smoker   . Smokeless tobacco: Not on file  . Alcohol Use: Yes     RARELY    OB  History    Grav Para Term Preterm Abortions TAB SAB Ect Mult Living                  Review of Systems  Constitutional: Negative for fever, chills, diaphoresis and fatigue.  Respiratory: Positive for shortness of breath. Negative for cough and wheezing.   Cardiovascular: Positive for chest pain, palpitations and syncope. Negative for orthopnea and claudication.  Gastrointestinal: Positive for nausea, abdominal pain and diarrhea. Negative for heartburn, vomiting, constipation, hematochezia, anorexia and hematemesis.  Genitourinary: Negative for dysuria, urgency, frequency, hematuria, vaginal bleeding and vaginal discharge.  Musculoskeletal: Negative for back pain.  Neurological: Positive for loss of consciousness and weakness. Negative for dizziness, vertigo, speech change, focal weakness, numbness and headaches.  All other systems reviewed and are negative.    Allergies  Aspirin; Darvocet; Dilaudid; Midrin; Penicillins; and Wellbutrin  Home Medications   Current Outpatient Rx  Name Route Sig Dispense Refill  . ACETAMINOPHEN-CODEINE #3 300-30 MG PO TABS Oral Take 1 tablet by mouth every 6 (six) hours as needed. For pain    . CYCLOBENZAPRINE HCL 10 MG PO TABS Oral Take 10 mg by mouth at bedtime.    . FLUOXETINE HCL 20 MG PO TABS Oral Take 20 mg by mouth 2 (two) times daily.     Leonel Ramsay 4 G RE KIT Rectal Place 4 g rectally daily as needed. colitis    . PHENYTOIN SODIUM EXTENDED 100 MG PO CAPS Oral Take 100 mg by mouth 4 (four) times daily.     . TRIAMTERENE-HCTZ 37.5-25 MG PO TABS Oral Take 1 tablet by mouth every morning.    Marland Kitchen PROMETHAZINE HCL 25 MG PO TABS Oral Take 25 mg by mouth every 6 (six) hours as needed. For nausea      BP 134/82  Pulse 89  Temp(Src) 97.4 F (36.3 C) (Oral)  Resp 22  SpO2 90%  Physical Exam  Nursing note and vitals reviewed. Constitutional: She is oriented to person, place, and time. She appears well-developed and well-nourished. No distress.  HENT:   Head: Normocephalic and atraumatic.  Mouth/Throat: Oropharynx is clear and moist.  Eyes: EOM are normal. Pupils are equal, round, and reactive to light.  Neck: Normal range of motion. Neck supple. No JVD present. No tracheal deviation present.  Cardiovascular: Normal rate, regular rhythm, normal heart sounds and intact distal pulses.   No extrasystoles are present. Exam reveals no gallop, no S3, no S4 and no friction rub.   No murmur heard. Pulmonary/Chest: Breath sounds normal. No accessory muscle usage or stridor. Not tachypneic. No respiratory distress. She has no wheezes. She has no rales. She exhibits no tenderness.  Abdominal: Soft. Bowel sounds are normal. She exhibits no distension and no mass. There is no hepatosplenomegaly. There is generalized tenderness. There is no rebound, no guarding and no CVA tenderness.  Musculoskeletal: Normal range of motion. She exhibits no edema and no tenderness.  Neurological: She is alert and oriented to person, place, and time. She has normal reflexes. No cranial nerve deficit. Coordination normal.  Skin: Skin is warm and dry. No rash noted. She is not diaphoretic. No erythema. No pallor.  Psychiatric: She has a normal mood and affect. Her behavior is normal. Judgment and thought content normal.    ED Course  Procedures (including  critical care time)   Date: 01/03/2012  Rate: 98  Rhythm: normal sinus rhythm  QRS Axis: normal  Intervals: normal  ST/T Wave abnormalities: nonspecific T wave changes  Conduction Disutrbances:none  Narrative Interpretation:   Old EKG Reviewed: inferior T wave inversions new since prior tracing    Labs Reviewed  CBC - Abnormal; Notable for the following:    RBC 5.23 (*)    MCH 24.9 (*)    RDW 16.1 (*)    All other components within normal limits  DIFFERENTIAL - Abnormal; Notable for the following:    Neutrophils Relative 37 (*)    Lymphocytes Relative 54 (*)    All other components within normal limits    COMPREHENSIVE METABOLIC PANEL - Abnormal; Notable for the following:    Potassium 3.3 (*)    Chloride 95 (*)    Total Protein 8.6 (*)    Total Bilirubin 0.2 (*)    All other components within normal limits  TROPONIN I  LIPASE, BLOOD  CK TOTAL AND CKMB  PROTIME-INR  APTT  URINALYSIS, ROUTINE W REFLEX MICROSCOPIC   Dg Chest 2 View  01/03/2012  *RADIOLOGY REPORT*  Clinical Data: Chest pain and shortness of breath.  CHEST - 2 VIEW  Comparison: 01/23/2011.  Findings: The cardiac silhouette, mediastinal and hilar contours are within normal limits and stable.  Slightly low lung volumes with mild vascular crowding but no infiltrates, edema or effusions. The bony thorax is intact.  IMPRESSION: Slightly low lung volumes with mild vascular crowding and atelectasis but no infiltrates, edema or effusions.  Original Report Authenticated By: P. Loralie Champagne, M.D.     No diagnosis found.    MDM  Chest pain:  ACS, Unstable angina, NSTEMI, Musculoskeletal chest pain, costochondritis, GERD, Gastrointestinal Chest Pain, Pleuritic Chest Pain, Pneumonia, Pneumothorax, Pulmonary Embolism, Esophageal Spasm, Arrhythmia  Syncope:  Arrhythmia, Orthostatic Hypotension, Vasovagal syncope, anemia, electrolyte abnormality  Abdominal Pain:  Ulcerative colitis flare, gastroenteritis, UTI  All of the above are considered in the patient's differential diagnosis amongst other possible etiologies.  I contacted the patient's cardiologist Dr. Eldridge Dace and discussed the case with him.  He advises (considering atypical features of chest pain, lack of significant CAD risk profile, negative ECG and cardiac enzymes) to wean the patient from nitroglycerin and admit to medical service for evaluation of her symptoms including gastrointestinal complaints and offers his services in consultation as needed/requested.       Felisa Bonier, MD 01/04/12 [phone]

## 2012-01-03 NOTE — ED Notes (Signed)
Admitting MD is at the bedside at this time.  Pt is in no distress

## 2012-01-04 DIAGNOSIS — R197 Diarrhea, unspecified: Secondary | ICD-10-CM | POA: Diagnosis not present

## 2012-01-04 DIAGNOSIS — R7401 Elevation of levels of liver transaminase levels: Secondary | ICD-10-CM

## 2012-01-04 DIAGNOSIS — R569 Unspecified convulsions: Secondary | ICD-10-CM | POA: Diagnosis not present

## 2012-01-04 DIAGNOSIS — R748 Abnormal levels of other serum enzymes: Secondary | ICD-10-CM | POA: Diagnosis not present

## 2012-01-04 DIAGNOSIS — D649 Anemia, unspecified: Secondary | ICD-10-CM

## 2012-01-04 DIAGNOSIS — E889 Metabolic disorder, unspecified: Secondary | ICD-10-CM | POA: Insufficient documentation

## 2012-01-04 DIAGNOSIS — K5190 Ulcerative colitis, unspecified, without complications: Secondary | ICD-10-CM | POA: Diagnosis not present

## 2012-01-04 DIAGNOSIS — R1084 Generalized abdominal pain: Secondary | ICD-10-CM | POA: Diagnosis not present

## 2012-01-04 DIAGNOSIS — R55 Syncope and collapse: Secondary | ICD-10-CM

## 2012-01-04 DIAGNOSIS — R079 Chest pain, unspecified: Secondary | ICD-10-CM | POA: Diagnosis not present

## 2012-01-04 LAB — COMPREHENSIVE METABOLIC PANEL
AST: 362 U/L — ABNORMAL HIGH (ref 0–37)
Alkaline Phosphatase: 59 U/L (ref 39–117)
BUN: 9 mg/dL (ref 6–23)
CO2: 24 mEq/L (ref 19–32)
Chloride: 102 mEq/L (ref 96–112)
Creatinine, Ser: 0.51 mg/dL (ref 0.50–1.10)
GFR calc non Af Amer: >90 mL/min (ref >90)
Potassium: 3.5 mEq/L (ref 3.5–5.1)
Total Bilirubin: 0.3 mg/dL (ref 0.3–1.2)

## 2012-01-04 LAB — VITAMIN B12: Vitamin B-12: 591 pg/mL (ref 211–911)

## 2012-01-04 LAB — URINALYSIS, ROUTINE W REFLEX MICROSCOPIC
Nitrite: NEGATIVE
Specific Gravity, Urine: 1.02 (ref 1.005–1.030)
Urobilinogen, UA: 0.2 mg/dL (ref 0.0–1.0)

## 2012-01-04 LAB — CARDIAC PANEL(CRET KIN+CKTOT+MB+TROPI)
CK, MB: 1.2 ng/mL (ref 0.3–4.0)
Relative Index: INVALID (ref 0.0–2.5)
Relative Index: INVALID (ref 0.0–2.5)
Total CK: 91 U/L (ref 7–177)
Troponin I: <0.3 ng/mL (ref <0.30)
Troponin I: <0.3 ng/mL (ref <0.30)

## 2012-01-04 LAB — FOLATE: Folate: 13.1 ng/mL

## 2012-01-04 LAB — CBC
HCT: 33 % — ABNORMAL LOW (ref 36.0–46.0)
Hemoglobin: 10.5 g/dL — ABNORMAL LOW (ref 12.0–15.0)
MCH: 25.2 pg — ABNORMAL LOW (ref 26.0–34.0)
MCHC: 31.8 g/dL (ref 30.0–36.0)
MCV: 79.3 fL (ref 78.0–100.0)
Platelets: 281 10*3/uL (ref 150–400)
RBC: 4.16 MIL/uL (ref 3.87–5.11)
RDW: 16.3 % — ABNORMAL HIGH (ref 11.5–15.5)
WBC: 3.2 10*3/uL — ABNORMAL LOW (ref 4.0–10.5)

## 2012-01-04 LAB — FERRITIN: Ferritin: 302 ng/mL — ABNORMAL HIGH (ref 10–291)

## 2012-01-04 LAB — LIPID PANEL
Cholesterol: 196 mg/dL (ref 0–200)
Total CHOL/HDL Ratio: 3 RATIO

## 2012-01-04 LAB — TSH: TSH: 2.492 u[IU]/mL (ref 0.350–4.500)

## 2012-01-04 MED ORDER — MESALAMINE 1.2 G PO TBEC
4.8000 g | DELAYED_RELEASE_TABLET | Freq: Every day | ORAL | Status: DC
  Administered 2012-01-05 – 2012-01-07 (×3): 4.8 g via ORAL
  Filled 2012-01-04 (×5): qty 4

## 2012-01-04 MED ORDER — LORAZEPAM 0.5 MG PO TABS
0.5000 mg | ORAL_TABLET | Freq: Three times a day (TID) | ORAL | Status: DC | PRN
  Administered 2012-01-05 – 2012-01-07 (×4): 0.5 mg via ORAL
  Filled 2012-01-04 (×4): qty 1

## 2012-01-04 MED ORDER — PROMETHAZINE HCL 25 MG/ML IJ SOLN
25.0000 mg | Freq: Four times a day (QID) | INTRAMUSCULAR | Status: DC | PRN
  Administered 2012-01-04 – 2012-01-06 (×5): 25 mg via INTRAVENOUS
  Filled 2012-01-04 (×6): qty 1

## 2012-01-04 MED ORDER — PHENYTOIN SODIUM EXTENDED 100 MG PO CAPS
200.0000 mg | ORAL_CAPSULE | Freq: Two times a day (BID) | ORAL | Status: DC
  Administered 2012-01-04 – 2012-01-07 (×6): 200 mg via ORAL
  Filled 2012-01-04 (×7): qty 2

## 2012-01-04 MED ORDER — ALPRAZOLAM 0.25 MG PO TABS
0.2500 mg | ORAL_TABLET | Freq: Once | ORAL | Status: AC
  Administered 2012-01-04: 0.25 mg via ORAL
  Filled 2012-01-04: qty 1

## 2012-01-04 MED ORDER — PHENYTOIN SODIUM EXTENDED 100 MG PO CAPS
400.0000 mg | ORAL_CAPSULE | ORAL | Status: AC
  Administered 2012-01-04 (×3): 400 mg via ORAL
  Filled 2012-01-04 (×3): qty 4

## 2012-01-04 NOTE — Progress Notes (Signed)
*  PRELIMINARY RESULTS* Vascular Ultrasound Carotid Duplex (Doppler) has been completed.  Preliminary findings: Bilaterally no significant ICA stenosis with antegrade vertebral flow.  Farrel Demark RDMS 01/04/2012, 11:19 AM

## 2012-01-04 NOTE — Progress Notes (Signed)
  Echocardiogram 2D Echocardiogram has been performed.  Erin Wolf 01/04/2012, 3:08 PM

## 2012-01-04 NOTE — Progress Notes (Signed)
Subjective: Patient is complaining of chest tightness that has been intermittent in nature since admission. Patient also states having diarrhea with mucus and abdominal cramping. Patient states when she tries to eat feels like she has to throw up.  Objective: Vital signs in last 24 hours: Filed Vitals:   01/03/12 1939 01/03/12 2000 01/03/12 2037 01/04/12 0400  BP: 118/55 108/46 122/82 141/89  Pulse: 101 95 90 88  Temp:   97.6 F (36.4 C) 98.1 F (36.7 C)  TempSrc:   Oral Oral  Resp:  15 18 18   Height:   5\' 5"  (1.651 m)   Weight:   107.5 kg (236 lb 15.9 oz) 107.8 kg (237 lb 10.5 oz)  SpO2:  96% 98% 97%   No intake or output data in the 24 hours ending 01/04/12 0941  Weight change:   General: Alert, awake, oriented x3, in no acute distress. Heart: Regular rate and rhythm, without murmurs, rubs, gallops. Lungs: Clear to auscultation bilaterally. Abdomen: Soft, nontender, nondistended, positive bowel sounds. Extremities: No clubbing cyanosis or edema with positive pedal pulses. Neuro: Grossly intact, nonfocal.   Lab Results:  Basename 01/04/12 0500 01/03/12 2049 01/03/12 1139  NA 136 -- 136  K 3.5 -- 3.3*  CL 102 -- 95*  CO2 24 -- 27  GLUCOSE 89 -- 94  BUN 9 -- 15  CREATININE 0.51 0.57 --  CALCIUM 8.4 -- 9.8  MG -- 2.2 --  PHOS -- -- --    Basename 01/04/12 0500 01/03/12 1139  AST 362* 21  ALT 241* 20  ALKPHOS 59 51  BILITOT 0.3 0.2*  PROT 6.5 8.6*  ALBUMIN 2.9* 3.9    Basename 01/03/12 1322  LIPASE 27  AMYLASE --    Basename 01/04/12 0500 01/03/12 2049 01/03/12 1139  WBC 3.2* 4.9 --  NEUTROABS -- -- 2.5  HGB 10.5* 11.3* --  HCT 33.0* 35.4* --  MCV 79.3 78.5 --  PLT 281 332 --    Basename 01/04/12 0640 01/03/12 2049 01/03/12 1322 01/03/12 1139  CKTOTAL 79 83 86 --  CKMB 1.2 1.2 1.1 --  CKMBINDEX -- -- -- --  TROPONINI <0.30 <0.30 -- <0.30   No components found with this basename: POCBNP:3  Basename 01/03/12 1844  DDIMER 0.25   No results  found for this basename: HGBA1C:2 in the last 72 hours  Basename 01/04/12 0500  CHOL 196  HDL 65  LDLCALC 115*  TRIG 80  CHOLHDL 3.0  LDLDIRECT --    Basename 01/03/12 2049  TSH 2.492  T4TOTAL --  T3FREE --  THYROIDAB --   No results found for this basename: VITAMINB12:2,FOLATE:2,FERRITIN:2,TIBC:2,IRON:2,RETICCTPCT:2 in the last 72 hours  Micro Results: No results found for this or any previous visit (from the past 240 hour(s)).  Studies/Results: Dg Chest 2 View  01/03/2012  *RADIOLOGY REPORT*  Clinical Data: Chest pain and shortness of breath.  CHEST - 2 VIEW  Comparison: 01/23/2011.  Findings: The cardiac silhouette, mediastinal and hilar contours are within normal limits and stable.  Slightly low lung volumes with mild vascular crowding but no infiltrates, edema or effusions. The bony thorax is intact.  IMPRESSION: Slightly low lung volumes with mild vascular crowding and atelectasis but no infiltrates, edema or effusions.  Original Report Authenticated By: P. Loralie Champagne, M.D.   Dg Abd 1 View  01/03/2012  *RADIOLOGY REPORT*  Clinical Data: Abdominal pain  ABDOMEN - 1 VIEW  Comparison: CT 10/21/2011  Findings: No dilated loops of large or small bowel.  No  pathologic calcifications.  No acute osseous abnormality.  IMPRESSION: No evidence of bowel obstruction or bowel abnormality by plain film.  Original Report Authenticated By: Genevive Bi, M.D.   Ct Head Wo Contrast  01/03/2012  *RADIOLOGY REPORT*  Clinical Data: Syncope  CT HEAD WITHOUT CONTRAST  Technique:  Contiguous axial images were obtained from the base of the skull through the vertex without contrast.  Comparison: Head CT 11/03/2011  Findings: No acute intracranial hemorrhage.  No focal mass lesion. No CT evidence of acute infarction.   No midline shift or mass effect.  No hydrocephalus.  Basilar cisterns are patent. Paranasal sinuses and mastoid air cells are clear.  Orbits are normal.  IMPRESSION: No acute  intracranial findings.  No change in short interval follow- up  Original Report Authenticated By: Genevive Bi, M.D.    Medications:    . ALPRAZolam  0.25 mg Oral Once  . cyclobenzaprine  10 mg Oral QHS  . enoxaparin  40 mg Subcutaneous QHS  . fentaNYL      . fentaNYL  100 mcg Intravenous Once  . FLUoxetine  20 mg Oral BID  . LORazepam      .  morphine injection  6 mg Intravenous Once  . nitroGLYCERIN  0.4 mg Sublingual Once  . nitroGLYCERIN  2-200 mcg/min Intravenous Once  . nitroGLYCERIN  1 mcg/min Intravenous Once  . pantoprazole  40 mg Oral Q0600  . phenytoin  200 mg Oral BID  . phenytoin  400 mg Oral Q2H  . pneumococcal 13-valent conjugate vaccine  0.5 mL Intramuscular Tomorrow-1000  . potassium chloride  40 mEq Oral Once  . promethazine  25 mg Intravenous Once  . sodium chloride  1,000 mL Intravenous Once  . sodium chloride  3 mL Intravenous Q12H  . triamterene-hydrochlorothiazide  1 each Oral Daily  . DISCONTD: phenytoin  100 mg Oral QID    Assessment: Principal Problem:  *Syncope and collapse Active Problems:  Chest pain  SOB (shortness of breath)  HTN (hypertension), benign  Hx of migraines  Seizure  Depression  Ulcerative colitis  Chronic back pain  Hyperlipidemia  Anemia  Transaminitis  Diarrhea   Plan: 1 syncope and collapse  Differential includes cardiogenic versus neurological versus orthostasis. CT of the head is negative. Carotid Dopplers pending.  Patient is not orthostatic. Cardiac enzymes neg x 2. 2-D echo pending.  EEG pending. No arrhythmias noted on telemetry. Follow.  #2 chest pain  Atypical in nature. Cardiac enzymes negative x2.2-D echo pending. LDL is 115. D-dimer is negative. May be anxiety related. Will try a dose of Ativan and follow while awaiting 2-D echo.  #3 shortness of breath  Chest x-ray is negative. d-dimer negative. Cardiac enzymes negative x2. 2-D echo pending.  Will follow.  #4 history of seizures  Continue home dose  Dilantin.  Dilantin level subtherapeutic. We'll have pharmacy dose Dilantin. EEG pending.  #5 history of ulcerative colitis  Patient continues to have mucous diarrhea, with some abdominal cramping and some nausea and emesis. Will consult with GI for further evaluation and management. Question colitis flare. Continue patient's rowasa. Will check stool studies. #6 hyperlipidemia  LDL of 115. Patient states was to reassess with her PCP in a few weeks to decide when to start a statin. We'll have patient followup with PCP.  #7 transaminitis Questionable etiology. We'll check an acute hepatitis panel. Will check an abdominal ultrasound. Monitor follow. GI consult is pending. #8 anemia No evidence of overt GI bleed. Will guaiac stools x3.  Will check an anemia panel. #9 prophylaxis  PPI for GI prophylaxis. Lovenox for DVT prophylaxis.       LOS: 1 day   [facility] 01/04/2012, 9:41 AM

## 2012-01-04 NOTE — Consult Note (Signed)
Referring Provider: Dr. Isla Pence Primary Care Physician:  [name], MD, MD Primary Gastroenterologist:  Dr. Dulce Sellar  Reason for Consultation:  Diarrhea; History of Ulcerative colitis  HPI: Erin Wolf is a 56 y.o. female with a history of ulcerative colitis who last had a colonoscopy in September 2012 that showed focal mucosal colitis of the hepatic flexure and no other evidence of colitis. Diffused diverticulosis was also seen. She has not been on any meds recently for her UC and the extent of her UC in the past is not known. Starting last Saturday she developed several episodes of loose/watery nonbloody stools per day that also had mucous in them. Reports severe nausea at home as well and no desire to eat right now due to nausea. Right-sided abdominal cramping but otherwise ok. Reports fever of 102 last Sunday. Given Rowasa enemas here and reports she feels a lot better and no BM today. She was admitted for syncope workup. Denies chills. Abd Xray negative. U/S pending.   Past Medical History  Diagnosis Date  . Hypertension   . Ulcerative colitis   . Migraines   . Seizure disorder   . HTN (hypertension), benign 01/03/2012  . Hx of migraines 01/03/2012  . Seizure 01/03/2012  . Depression 01/03/2012  . Chronic back pain 01/03/2012  . 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 Hyperlipidemia 01/03/2012  . Hepatitis C 01/03/2012  . Fibroids 01/03/2012    Past Surgical History  Procedure Date  . Cholecystectomy   . Abdominal hysterectomy   . Oophrectomy     Prior to Admission medications   Medication Sig Start Date End Date Taking? Authorizing Provider  acetaminophen-codeine (TYLENOL #3) 300-30 MG per tablet Take 1 tablet by mouth every 6 (six) hours as needed. For pain   Yes Historical Provider, MD  cyclobenzaprine (FLEXERIL) 10 MG tablet Take 10 mg by mouth at bedtime.   Yes Historical Provider, MD  FLUoxetine (PROZAC) 20 MG tablet Take 20 mg by mouth 2 (two) times daily.    Yes  Historical Provider, MD  Mesalamine-Cleanser (ROWASA) 4 G KIT Place 4 g rectally daily as needed. colitis   Yes Historical Provider, MD  phenytoin (DILANTIN) 100 MG ER capsule Take 200 mg by mouth 2 (two) times daily.   Yes Historical Provider, MD  triamterene-hydrochlorothiazide (MAXZIDE-25) 37.5-25 MG per tablet Take 1 tablet by mouth every morning.   Yes Historical Provider, MD  promethazine (PHENERGAN) 25 MG tablet Take 25 mg by mouth every 6 (six) hours as needed. For nausea    Historical Provider, MD    Scheduled Meds:   . ALPRAZolam  0.25 mg Oral Once  . cyclobenzaprine  10 mg Oral QHS  . enoxaparin  40 mg Subcutaneous QHS  . FLUoxetine  20 mg Oral BID  . nitroGLYCERIN  2-200 mcg/min Intravenous Once  . nitroGLYCERIN  1 mcg/min Intravenous Once  . pantoprazole  40 mg Oral Q0600  . phenytoin  200 mg Oral BID  . phenytoin  400 mg Oral Q2H  . pneumococcal 13-valent conjugate vaccine  0.5 mL Intramuscular Tomorrow-1000  . sodium chloride  3 mL Intravenous Q12H  . triamterene-hydrochlorothiazide  1 each Oral Daily  . DISCONTD: phenytoin  100 mg Oral QID   Continuous Infusions:   . sodium chloride 75 mL/hr at 01/03/12 2040  . DISCONTD: sodium chloride 125 mL/hr at 01/03/12 1602   PRN Meds:.acetaminophen, acetaminophen, acetaminophen-codeine, alum & mag hydroxide-simeth, LORazepam, mesalamine, morphine injection, nitroGLYCERIN, promethazine, senna-docusate, DISCONTD: nitroGLYCERIN, DISCONTD: ondansetron (ZOFRAN) IV, DISCONTD:  ondansetron, DISCONTD: Rowasa  Allergies as of 01/03/2012 - Review Complete 01/03/2012  Allergen Reaction Noted  . Aspirin Nausea Only 05/07/2011  . Darvocet (propoxyphene n-acetaminophen) Nausea And Vomiting 05/07/2011  . Dilaudid (hydromorphone hcl) Itching and Nausea Only 05/07/2011  . Midrin Nausea Only 05/07/2011  . Penicillins Hives 05/07/2011  . Wellbutrin (bupropion hcl) Other (See Comments) 05/07/2011    History reviewed. No pertinent family  history.  History   Social History  . Marital Status: Married    Spouse Name: N/A    Number of Children: N/A  . Years of Education: N/A   Occupational History  . Not on file.   Social History Main Topics  . Smoking status: Never Smoker   . Smokeless tobacco: Not on file  . Alcohol Use: Yes     RARELY  . Drug Use: No  . Sexually Active:    Other Topics Concern  . Not on file   Social History Narrative  . No narrative on file    Review of Systems: All negative except as stated above in HPI.  Physical Exam: Vital signs: Filed Vitals:   01/04/12 1508  BP: 111/70  Pulse: 78  Temp: 97.1 F (36.2 C)  Resp: 16   Last BM Date: 01/03/12 General:   Alert,  Well-developed, well-nourished, pleasant and cooperative in NAD Lungs:  Clear throughout to auscultation.   No wheezes, crackles, or rhonchi. No acute distress. Heart:  Regular rate and rhythm; no murmurs, clicks, rubs,  or gallops. Abdomen: Tender in RLQ and right mid-quadrant with guarding, soft, ND, +BS Rectal:  Deferred  GI:  Lab Results:  Basename 01/04/12 0500 01/03/12 2049 01/03/12 1139  WBC 3.2* 4.9 6.7  HGB 10.5* 11.3* 13.0  HCT 33.0* 35.4* 41.3  PLT 281 332 364   BMET  Basename 01/04/12 0500 01/03/12 2049 01/03/12 1139  NA 136 -- 136  K 3.5 -- 3.3*  CL 102 -- 95*  CO2 24 -- 27  GLUCOSE 89 -- 94  BUN 9 -- 15  CREATININE 0.51 0.57 0.62  CALCIUM 8.4 -- 9.8   LFT  Basename 01/04/12 0500  PROT 6.5  ALBUMIN 2.9*  AST 362*  ALT 241*  ALKPHOS 59  BILITOT 0.3  BILIDIR --  IBILI --   PT/INR  Basename 01/03/12 2049 01/03/12 1322  LABPROT 13.0 13.4  INR 0.96 1.00     Studies/Results: Dg Chest 2 View  01/03/2012  *RADIOLOGY REPORT*  Clinical Data: Chest pain and shortness of breath.  CHEST - 2 VIEW  Comparison: 01/23/2011.  Findings: The cardiac silhouette, mediastinal and hilar contours are within normal limits and stable.  Slightly low lung volumes with mild vascular crowding but no  infiltrates, edema or effusions. The bony thorax is intact.  IMPRESSION: Slightly low lung volumes with mild vascular crowding and atelectasis but no infiltrates, edema or effusions.  Original Report Authenticated By: P. Loralie Champagne, M.D.   Dg Abd 1 View  01/03/2012  *RADIOLOGY REPORT*  Clinical Data: Abdominal pain  ABDOMEN - 1 VIEW  Comparison: CT 10/21/2011  Findings: No dilated loops of large or small bowel.  No pathologic calcifications.  No acute osseous abnormality.  IMPRESSION: No evidence of bowel obstruction or bowel abnormality by plain film.  Original Report Authenticated By: Genevive Bi, M.D.   Ct Head Wo Contrast  01/03/2012  *RADIOLOGY REPORT*  Clinical Data: Syncope  CT HEAD WITHOUT CONTRAST  Technique:  Contiguous axial images were obtained from the base of the skull through the  vertex without contrast.  Comparison: Head CT 11/03/2011  Findings: No acute intracranial hemorrhage.  No focal mass lesion. No CT evidence of acute infarction.   No midline shift or mass effect.  No hydrocephalus.  Basilar cisterns are patent. Paranasal sinuses and mastoid air cells are clear.  Orbits are normal.  IMPRESSION: No acute intracranial findings.  No change in short interval follow- up  Original Report Authenticated By: Genevive Bi, M.D.    Impression/Plan: 55yo BF with reported history of UC but no evidence of UC on her last colonoscopy. In fact, she had a focal area of colitis in her hepatic flexure with sparing of the rectum. This area could represent NSAID-related colitis or Crohn's colitis. Will d/c Rowasa enemas and start Lialda 4.8g/day. Repeat colonoscopy not needed at this time in my opinion. Doubt infectious source based on resolution of her diarrhea thus far. Supportive care otherwise. F/U on U/S.     LOS: 1 day   Dejai Schubach C.  01/04/2012, 4:45 PM

## 2012-01-04 NOTE — Progress Notes (Signed)
MEDICATION RELATED CONSULT NOTE - INITIAL   Pharmacy Consult for Dilantin Indication: Seizure disorder  Allergies  Allergen Reactions  . Aspirin Nausea Only  . Darvocet (Propoxyphene N-Acetaminophen) Nausea And Vomiting  . Dilaudid (Hydromorphone Hcl) Itching and Nausea Only  . Midrin Nausea Only  . Penicillins Hives  . Wellbutrin (Bupropion Hcl) Other (See Comments)    Insomnia and headaches    Patient Measurements: Height: 5\' 5"  (165.1 cm) Weight: 237 lb 10.5 oz (107.8 kg) IBW/kg (Calculated) : 57  Adjusted body wt 80kg  Vital Signs: Temp: 98.1 F (36.7 C) (03/16 0400) Temp src: Oral (03/16 0400) BP: 141/89 mmHg (03/16 0400) Pulse Rate: 88  (03/16 0400)  Labs:  Basename 01/04/12 0500 01/03/12 2049 01/03/12 1322 01/03/12 1139  WBC 3.2* 4.9 -- 6.7  HGB 10.5* 11.3* -- 13.0  HCT 33.0* 35.4* -- 41.3  PLT 281 332 -- 364  APTT -- 35 35 --  CREATININE -- 0.57 -- 0.62  LABCREA -- -- -- --  CREATININE -- 0.57 -- 0.62  CREAT24HRUR -- -- -- --  MG -- 2.2 -- --  PHOS -- -- -- --  ALBUMIN -- -- -- 3.9  PROT -- -- -- 8.6*  ALBUMIN -- -- -- 3.9  AST -- -- -- 21  ALT -- -- -- 20  ALKPHOS -- -- -- 51  BILITOT -- -- -- 0.2*  BILIDIR -- -- -- --  IBILI -- -- -- --   Estimated Creatinine Clearance: 95.8 ml/min (by C-G formula based on Cr of 0.57).   Microbiology: No results found for this or any previous visit (from the past 720 hour(s)).  Medical History: Past Medical History  Diagnosis Date  . Hypertension   . Ulcerative colitis   . Migraines   . Seizure disorder   . HTN (hypertension), benign 01/03/2012  . Hx of migraines 01/03/2012  . Seizure 01/03/2012  . Depression 01/03/2012  . Chronic back pain 01/03/2012  . 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 Hyperlipidemia 01/03/2012  . Hepatitis C 01/03/2012  . Fibroids 01/03/2012    Medications:  Scheduled:    . ALPRAZolam  0.25 mg Oral Once  . cyclobenzaprine  10 mg Oral QHS  . enoxaparin  40 mg Subcutaneous QHS  .  fentaNYL      . fentaNYL  100 mcg Intravenous Once  . FLUoxetine  20 mg Oral BID  . LORazepam      .  morphine injection  6 mg Intravenous Once  . nitroGLYCERIN  0.4 mg Sublingual Once  . nitroGLYCERIN  2-200 mcg/min Intravenous Once  . nitroGLYCERIN  1 mcg/min Intravenous Once  . pantoprazole  40 mg Oral Q0600  . phenytoin  100 mg Oral QID  . pneumococcal 13-valent conjugate vaccine  0.5 mL Intramuscular Tomorrow-1000  . potassium chloride  40 mEq Oral Once  . promethazine  25 mg Intravenous Once  . sodium chloride  1,000 mL Intravenous Once  . sodium chloride  3 mL Intravenous Q12H  . triamterene-hydrochlorothiazide  1 each Oral Daily    Assessment: 56yo female admitted with syncopal episode.  She has a seizure disorder and is on Dilantin with an unreadable level of < 2.5; her albumin level is wnl.  Per discussion with pt, she describes herself as taking the Dilantin when she remembers; she does not think she took it yesterday.  She does not feel the syncopal episode was a seizure because of the chest tightness & dizziness that is not associated with seizures she has  experienced.  Goal of Therapy:  Dilantin level 10-20  Plan:  1.  Will give oral load of Dilantin 400mg  po q2h x 3 doses 2.  Continue Dilantin 200mg  po Q12 3.  Will check level in AM to assure appropriate load, then again in 5-7 days.  Marisue Humble P 01/04/2012,8:33 AM

## 2012-01-05 ENCOUNTER — Inpatient Hospital Stay (HOSPITAL_COMMUNITY): Payer: Medicare Other

## 2012-01-05 DIAGNOSIS — R197 Diarrhea, unspecified: Secondary | ICD-10-CM | POA: Diagnosis not present

## 2012-01-05 DIAGNOSIS — R55 Syncope and collapse: Secondary | ICD-10-CM | POA: Diagnosis not present

## 2012-01-05 DIAGNOSIS — R079 Chest pain, unspecified: Secondary | ICD-10-CM | POA: Diagnosis not present

## 2012-01-05 DIAGNOSIS — R569 Unspecified convulsions: Secondary | ICD-10-CM | POA: Diagnosis not present

## 2012-01-05 DIAGNOSIS — R1084 Generalized abdominal pain: Secondary | ICD-10-CM | POA: Diagnosis not present

## 2012-01-05 DIAGNOSIS — K5190 Ulcerative colitis, unspecified, without complications: Secondary | ICD-10-CM | POA: Diagnosis not present

## 2012-01-05 LAB — URINE CULTURE

## 2012-01-05 LAB — COMPREHENSIVE METABOLIC PANEL
BUN: 7 mg/dL (ref 6–23)
Calcium: 8.6 mg/dL (ref 8.4–10.5)
GFR calc Af Amer: >90 mL/min (ref >90)
Glucose, Bld: 89 mg/dL (ref 70–99)
Total Protein: 6.6 g/dL (ref 6.0–8.3)

## 2012-01-05 LAB — DIFFERENTIAL
Basophils Absolute: 0 10*3/uL (ref 0.0–0.1)
Lymphocytes Relative: 46 % (ref 12–46)
Monocytes Absolute: 0.5 10*3/uL (ref 0.1–1.0)
Monocytes Relative: 11 % (ref 3–12)
Neutro Abs: 1.7 10*3/uL (ref 1.7–7.7)
Neutrophils Relative %: 42 % — ABNORMAL LOW (ref 43–77)

## 2012-01-05 LAB — HEPATITIS PANEL, ACUTE
HCV Ab: REACTIVE — AB
Hep A IgM: NEGATIVE
Hep B C IgM: NEGATIVE
Hepatitis B Surface Ag: NEGATIVE

## 2012-01-05 LAB — PHENYTOIN LEVEL, TOTAL: Phenytoin Lvl: 9.1 ug/mL — ABNORMAL LOW (ref 10.0–20.0)

## 2012-01-05 LAB — CBC
HCT: 34.6 % — ABNORMAL LOW (ref 36.0–46.0)
Hemoglobin: 11.1 g/dL — ABNORMAL LOW (ref 12.0–15.0)
RDW: 16 % — ABNORMAL HIGH (ref 11.5–15.5)
WBC: 4.1 10*3/uL (ref 4.0–10.5)

## 2012-01-05 MED ORDER — ENOXAPARIN SODIUM 60 MG/0.6ML ~~LOC~~ SOLN
55.0000 mg | Freq: Every day | SUBCUTANEOUS | Status: DC
  Administered 2012-01-05 – 2012-01-06 (×2): 55 mg via SUBCUTANEOUS
  Filled 2012-01-05 (×3): qty 0.6

## 2012-01-05 MED ORDER — PHENYTOIN SODIUM EXTENDED 100 MG PO CAPS
400.0000 mg | ORAL_CAPSULE | Freq: Once | ORAL | Status: AC
  Administered 2012-01-05: 400 mg via ORAL
  Filled 2012-01-05: qty 4

## 2012-01-05 NOTE — Progress Notes (Signed)
Physical Therapy Note   01/05/12 1215  PT Visit Information  Reason Eval/Treat Not Completed Patient refused (Pt politely declined; too tired)      Will plan to return later today or tomorrow for PT eval as time allows    [place], [place] [phone]

## 2012-01-05 NOTE — Progress Notes (Signed)
Patient ID: Erin Wolf, female   DOB: 10-07-1956, 56 y.o.   MRN: [id number] [facility] Gastroenterology Progress Note  Erin Wolf 56 y.o. Oct 24, 1955   Subjective: Vomited this morning with breakfast but since then has eaten better and denies abdominal pain or nausea. Feels a lot better compared with yesterday. U/S noted.  Objective: Vital signs: Filed Vitals:   01/05/12 1442  BP: 116/76  Pulse: 81  Temp: 97 F (36.1 C)  Resp: 16    Intake/Output last 24 hrs:  Intake/Output Summary (Last 24 hours) at 01/05/12 2041 Last data filed at 01/05/12 1700  Gross per 24 hour  Intake    360 ml  Output    200 ml  Net    160 ml    Physical Exam: Gen: alert, no acute distress  Abd: soft, non-tender; bowel sounds normal; no masses,  no organomegaly  Lab Results:  Basename 01/05/12 0600 01/04/12 0500 01/03/12 2049  NA 140 136 --  K 3.6 3.5 --  CL 105 102 --  CO2 27 24 --  GLUCOSE 89 89 --  BUN 7 9 --  CREATININE 0.51 0.51 --  CALCIUM 8.6 8.4 --  MG -- -- 2.2  PHOS -- -- --    Basename 01/05/12 0600 01/04/12 0500  AST 152* 362*  ALT 221* 241*  ALKPHOS 70 59  BILITOT 0.2* 0.3  PROT 6.6 6.5  ALBUMIN 2.9* 2.9*    Basename 01/05/12 0600 01/04/12 0500 01/03/12 1139  WBC 4.1 3.2* --  NEUTROABS 1.7 -- 2.5  HGB 11.1* 10.5* --  HCT 34.6* 33.0* --  MCV 77.9* 79.3 --  PLT 290 281 --     Medications: I have reviewed the patient's current medications.  Assessment/Plan: 56yo female with history of UC now on Lialda and reports resolution of her loose stools and abdominal pain. U/S showed possible CBD dilation but liver enzymes not consistent with cholestasis or extrahepatic obstruction. Transaminitis probably due to meds, alcohol, hx Hep C. Doubt biliary sludge/stone. GI symptoms have resolved. Would d/c on Lialda. If GI symptoms do not return, then can go home from GI standpoint and f/u with Dr. Dulce Sellar. Will have Dr. Magod/Buccini check on tomorrow.   Davaun Quintela  C. 01/05/2012, 8:41 PM

## 2012-01-05 NOTE — Progress Notes (Signed)
MEDICATION RELATED CONSULT NOTE - FOLLOW-UP  Pharmacy Consult for Dilantin Indication: Seizure disorder  Allergies  Allergen Reactions  . Aspirin Nausea Only  . Darvocet (Propoxyphene N-Acetaminophen) Nausea And Vomiting  . Dilaudid (Hydromorphone Hcl) Itching and Nausea Only  . Midrin Nausea Only  . Penicillins Hives  . Wellbutrin (Bupropion Hcl) Other (See Comments)    Insomnia and headaches    Patient Measurements: Height: 5\' 5"  (165.1 cm) Weight: 244 lb 4.8 oz (110.814 kg) IBW/kg (Calculated) : 57  Adjusted body wt 80kg  Vital Signs: Temp: 98 F (36.7 C) (03/17 0452) Temp src: Oral (03/17 0452) BP: 121/74 mmHg (03/17 0452) Pulse Rate: 73  (03/17 0452)  Labs:  Basename 01/05/12 0600 01/04/12 0500 01/03/12 2049 01/03/12 1322 01/03/12 1139  WBC 4.1 3.2* 4.9 -- --  HGB 11.1* 10.5* 11.3* -- --  HCT 34.6* 33.0* 35.4* -- --  PLT 290 281 332 -- --  APTT -- -- 35 35 --  CREATININE 0.51 0.51 0.57 -- --  LABCREA -- -- -- -- --  CREATININE 0.51 0.51 0.57 -- --  CREAT24HRUR -- -- -- -- --  MG -- -- 2.2 -- --  PHOS -- -- -- -- --  ALBUMIN 2.9* 2.9* -- -- 3.9  PROT 6.6 6.5 -- -- 8.6*  ALBUMIN 2.9* 2.9* -- -- 3.9  AST 152* 362* -- -- 21  ALT 221* 241* -- -- 20  ALKPHOS 70 59 -- -- 51  BILITOT 0.2* 0.3 -- -- 0.2*  BILIDIR -- -- -- -- --  IBILI -- -- -- -- --   Estimated Creatinine Clearance: 97.3 ml/min (by C-G formula based on Cr of 0.51).   Microbiology: Recent Results (from the past 720 hour(s))  URINE CULTURE     Status: Normal   Collection Time   01/03/12 11:40 PM      Component Value Range Status Comment   Specimen Description URINE, RANDOM   Final    Special Requests NONE   Final    Culture  Setup Time [id number]   Final    Colony Count 2,000 COLONIES/ML   Final    Culture INSIGNIFICANT GROWTH   Final    Report Status 01/05/2012 FINAL   Final    Medications:  Scheduled:     . cyclobenzaprine  10 mg Oral QHS  . enoxaparin  40 mg Subcutaneous QHS  .  FLUoxetine  20 mg Oral BID  . mesalamine  4.8 g Oral Q breakfast  . pantoprazole  40 mg Oral Q0600  . phenytoin  200 mg Oral BID  . phenytoin  400 mg Oral Q2H  . sodium chloride  3 mL Intravenous Q12H  . triamterene-hydrochlorothiazide  1 each Oral Daily    Assessment: 56yo female admitted with syncopal episode.  She has a h/o seizure disorder and reports taking Dilantin PTA but DPH level was unreadable at < 2.5 on admit; her albumin level is wnl.  Per discussion with pt, she describes herself as taking the Dilantin when she remembers; she does not think she took it the day prior to admission.  She does not feel the syncopal episode was a seizure because of the chest tightness & dizziness that is not associated with seizures she has experienced.  No further seizure like activity has been noted.  3/16 patient received dilantin load: 400 mg po q2h x 3 doses, then restarted on DPH 200 mg po q12h (her home dose).  Level this am reported as 9, reflecting almost therapeutic load.  Goal of Therapy:  Dilantin level 10-20  Plan:  1.  Give additional dilantin 400 mg dilantin po load today to ensure she is within goal range. 2.  Continue Dilantin 200mg  po Q12 3.  Check stead state level in 5-7 days.  Michaelina Blandino L. Illene Bolus, PharmD, BCPS Clinical Pharmacist Pager: [phone] 01/05/2012 11:05 AM

## 2012-01-05 NOTE — Progress Notes (Signed)
 Subjective: Patient states feeling better. Patient with emesis this morning, however tolerating lunch. Abd pain improved. Patient states no BM yesterday or today. No syncope. Chest tightness improving per patient.  Objective: Vital signs in last 24 hours: Filed Vitals:   01/04/12 1508 01/04/12 2039 01/05/12 0452 01/05/12 0645  BP: 111/70 134/76 121/74   Pulse: 78 93 73   Temp: 97.1 F (36.2 C) 97.9 F (36.6 C) 98 F (36.7 C)   TempSrc: Oral Oral Oral   Resp: 16 17 18    Height:      Weight:    110.814 kg (244 lb 4.8 oz)  SpO2: 98% 95% 97%     Intake/Output Summary (Last 24 hours) at 01/05/12 1425 Last data filed at 01/05/12 0626  Gross per 24 hour  Intake    195 ml  Output    500 ml  Net   -305 ml    Weight change: 3.314 kg (7 lb 4.9 oz)  General: Alert, awake, oriented x3, in no acute distress. Heart: Regular rate and rhythm, without murmurs, rubs, gallops. Lungs: Clear to auscultation bilaterally. Abdomen: Soft, nontender, nondistended, positive bowel sounds. Extremities: No clubbing cyanosis or edema with positive pedal pulses. Neuro: Grossly intact, nonfocal.   Lab Results:  Basename 01/05/12 0600 01/04/12 0500 01/03/12 2049  NA 140 136 --  K 3.6 3.5 --  CL 105 102 --  CO2 27 24 --  GLUCOSE 89 89 --  BUN 7 9 --  CREATININE 0.51 0.51 --  CALCIUM 8.6 8.4 --  MG -- -- 2.2  PHOS -- -- --    Basename 01/05/12 0600 01/04/12 0500  AST 152* 362*  ALT 221* 241*  ALKPHOS 70 59  BILITOT 0.2* 0.3  PROT 6.6 6.5  ALBUMIN 2.9* 2.9*    Basename 01/03/12 1322  LIPASE 27  AMYLASE --    Basename 01/05/12 0600 01/04/12 0500 01/03/12 1139  WBC 4.1 3.2* --  NEUTROABS 1.7 -- 2.5  HGB 11.1* 10.5* --  HCT 34.6* 33.0* --  MCV 77.9* 79.3 --  PLT 290 281 --    Basename 01/04/12 1245 01/04/12 0640 01/03/12 2049  CKTOTAL 91 79 83  CKMB 1.2 1.2 1.2  CKMBINDEX -- -- --  TROPONINI <0.30 <0.30 <0.30   No components found with this basename: POCBNP:3  Basename  01/03/12 1844  DDIMER 0.25   No results found for this basename: HGBA1C:2 in the last 72 hours  Basename 01/04/12 0500  CHOL 196  HDL 65  LDLCALC 115*  TRIG 80  CHOLHDL 3.0  LDLDIRECT --    Basename 01/03/12 2049  TSH 2.492  T4TOTAL --  T3FREE --  THYROIDAB --    Basename 01/04/12 0946  VITAMINB12 591  FOLATE 13.1  FERRITIN 302*  TIBC 314  IRON 72  RETICCTPCT --    Micro Results: Recent Results (from the past 240 hour(s))  URINE CULTURE     Status: Normal   Collection Time   01/03/12 11:40 PM      Component Value Range Status Comment   Specimen Description URINE, RANDOM   Final    Special Requests NONE   Final    Culture  Setup Time [id number]   Final    Colony Count 2,000 COLONIES/ML   Final    Culture INSIGNIFICANT GROWTH   Final    Report Status 01/05/2012 FINAL   Final     Studies/Results: Dg Abd 1 View  01/03/2012  *RADIOLOGY REPORT*  Clinical Data: Abdominal pain  ABDOMEN -  1 VIEW  Comparison: CT 10/21/2011  Findings: No dilated loops of large or small bowel.  No pathologic calcifications.  No acute osseous abnormality.  IMPRESSION: No evidence of bowel obstruction or bowel abnormality by plain film.  Original Report Authenticated By: Genevive Bi, M.D.   Ct Head Wo Contrast  01/03/2012  *RADIOLOGY REPORT*  Clinical Data: Syncope  CT HEAD WITHOUT CONTRAST  Technique:  Contiguous axial images were obtained from the base of the skull through the vertex without contrast.  Comparison: Head CT 11/03/2011  Findings: No acute intracranial hemorrhage.  No focal mass lesion. No CT evidence of acute infarction.   No midline shift or mass effect.  No hydrocephalus.  Basilar cisterns are patent. Paranasal sinuses and mastoid air cells are clear.  Orbits are normal.  IMPRESSION: No acute intracranial findings.  No change in short interval follow- up  Original Report Authenticated By: Genevive Bi, M.D.   US Abdomen Complete  01/05/2012  *RADIOLOGY REPORT*  Clinical  Data:  Increased liver function enzymes  COMPLETE ABDOMINAL ULTRASOUND  Comparison:  CT abdomen 10/21/2011  Findings:  Gallbladder:  Surgically absent  Common bile duct:  13 mm.  This is increased from 7 mm on comparison CT  Liver:  Increase in liver echogenicity.  No ductal dilatation.  IVC:  Appears normal.  Pancreas:  Dilated pancreatic duct 5 mm.  Spleen:  Normal size and echogenicity.  Right Kidney:  10.0cm in length.  No evidence of hydronephrosis or stones.  Left Kidney:  11.0cm in length.  No evidence of hydronephrosis or stones.  Abdominal aorta:  No aneurysm identified.  IMPRESSION:  1.  Interval increase in diameter of common bile duct and pancreatic duct compared to CT of 10/21/2011.  I am not convinced that the common bile duct measurement is accurate.  If concern for obstruction, consider  MRCP.   2.  Echogenic liver commonly represent hepatic steatosis.  Original Report Authenticated By: Genevive Bi, M.D.    Medications:    . cyclobenzaprine  10 mg Oral QHS  . enoxaparin  55 mg Subcutaneous QHS  . FLUoxetine  20 mg Oral BID  . mesalamine  4.8 g Oral Q breakfast  . pantoprazole  40 mg Oral Q0600  . phenytoin  200 mg Oral BID  . phenytoin  400 mg Oral Q2H  . phenytoin  400 mg Oral Once  . sodium chloride  3 mL Intravenous Q12H  . triamterene-hydrochlorothiazide  1 each Oral Daily  . DISCONTD: enoxaparin  40 mg Subcutaneous QHS    Assessment: Principal Problem:  *Syncope and collapse Active Problems:  Chest pain  SOB (shortness of breath)  HTN (hypertension), benign  Hx of migraines  Seizure  Depression  Ulcerative colitis  Chronic back pain  Hyperlipidemia  Anemia  Transaminitis  Diarrhea   Plan: 1 syncope and collapse  Differential includes cardiogenic versus neurological versus orthostasis. CT of the head is negative. Carotid Dopplers prelim neg.  Patient is not orthostatic. Cardiac enzymes neg x 3. 2-D echo pending.  EEG pending. No arrhythmias noted on  telemetry. No further episodes of syncope. Follow.  #2 chest pain  Atypical in nature. Cardiac enzymes negative x3. 2-D echo pending. LDL is 115. D-dimer is negative. May be anxiety related.   #3 shortness of breath  Chest x-ray is negative. d-dimer negative. Cardiac enzymes negative x3. 2-D echo pending.  Will follow.  #4 history of seizures  Continue home dose Dilantin.  Dilantin level subtherapeutic. We'll have pharmacy dose Dilantin.  EEG pending. Dilantin dose per pharmacy. #5 history of ulcerative colitis  Patient states no further diarrhea, and abdominal cramping improving and some nausea and emesis. Abd [place] with dilated CBD and PD. Patient changed to lialda. Per GI ff. #6 hyperlipidemia  LDL of 115. Patient states was to reassess with her PCP in a few weeks to decide when to start a statin. We'll have patient followup with PCP.  #7 transaminitis Questionable etiology. We'll check an acute hepatitis panel. abdominal ultrasound with dilated CBD and PD. Gi FF. #8 anemia No evidence of overt GI bleed. Will guaiac stools x3. Will check an anemia panel. #9 prophylaxis  PPI for GI prophylaxis. Lovenox for DVT prophylaxis.       LOS: 2 days   , 01/05/2012, 2:25 PM

## 2012-01-06 ENCOUNTER — Inpatient Hospital Stay (HOSPITAL_COMMUNITY): Payer: Medicare Other

## 2012-01-06 ENCOUNTER — Other Ambulatory Visit (HOSPITAL_COMMUNITY): Payer: Medicare Other

## 2012-01-06 DIAGNOSIS — R197 Diarrhea, unspecified: Secondary | ICD-10-CM | POA: Diagnosis not present

## 2012-01-06 DIAGNOSIS — K7689 Other specified diseases of liver: Secondary | ICD-10-CM | POA: Diagnosis not present

## 2012-01-06 DIAGNOSIS — R079 Chest pain, unspecified: Secondary | ICD-10-CM | POA: Diagnosis not present

## 2012-01-06 DIAGNOSIS — R569 Unspecified convulsions: Secondary | ICD-10-CM | POA: Diagnosis not present

## 2012-01-06 DIAGNOSIS — R7989 Other specified abnormal findings of blood chemistry: Secondary | ICD-10-CM | POA: Diagnosis not present

## 2012-01-06 DIAGNOSIS — R55 Syncope and collapse: Secondary | ICD-10-CM | POA: Diagnosis not present

## 2012-01-06 DIAGNOSIS — R932 Abnormal findings on diagnostic imaging of liver and biliary tract: Secondary | ICD-10-CM | POA: Diagnosis not present

## 2012-01-06 DIAGNOSIS — K5190 Ulcerative colitis, unspecified, without complications: Secondary | ICD-10-CM | POA: Diagnosis not present

## 2012-01-06 DIAGNOSIS — R109 Unspecified abdominal pain: Secondary | ICD-10-CM | POA: Diagnosis not present

## 2012-01-06 MED ORDER — GADOBENATE DIMEGLUMINE 529 MG/ML IV SOLN
20.0000 mL | Freq: Once | INTRAVENOUS | Status: AC | PRN
  Administered 2012-01-06: 20 mL via INTRAVENOUS

## 2012-01-06 NOTE — Progress Notes (Signed)
Erin Wolf 9:15 AM  Subjective: The patient is doing much better without diarrhea in fact she has not had a bowel movement and her pain is less and she is eating fine. Her office chart was reviewed as was her history and she's been told she had hepatitis C for some time and her last quantitative mRNA was negative and she has no new complaints Objective: Vital signs stable afebrile no acute distress abdomen is soft good bowel minimal right-sided no guarding or rebound no new labs  Assessment: #1 colitis improved 2. Abnormal liver tests questionable etiology 3. Minimal abdominal pain improved  Plan: I think patient is ready for discharge and to be followed up in our office by my partner Dr. Dulce Sellar who has seen her before and I would recommend a MRCP and a repeat hepatitis C mRNA quantitative lab and those results can all be followed up in the office as well as her colitis symptoms and repeat liver tests. Please call if I can be of any further assistance during this hospital stay  [facility]

## 2012-01-06 NOTE — Evaluation (Signed)
Occupational Therapy Evaluation Patient Details Name: Erin Wolf MRN: [id number] DOB: Dec 22, 1955 Today's Date: 01/06/2012  Problem List:  Patient Active Problem List  Diagnoses  . Syncope and collapse  . Chest pain  . SOB (shortness of breath)  . HTN (hypertension), benign  . Hx of migraines  . Seizure  . Depression  . Ulcerative colitis  . Chronic back pain  . Dysthymic disorder  . Hyperlipidemia  . Hepatitis C  . Fibroids  . Anemia  . Transaminitis  . Diarrhea    Past Medical History:  Past Medical History  Diagnosis Date  . Hypertension   . Ulcerative colitis   . Migraines   . Seizure disorder   . HTN (hypertension), benign 01/03/2012  . Hx of migraines 01/03/2012  . Seizure 01/03/2012  . Depression 01/03/2012  . Chronic back pain 01/03/2012  . 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 Hyperlipidemia 01/03/2012  . Hepatitis C 01/03/2012  . Fibroids 01/03/2012   Past Surgical History:  Past Surgical History  Procedure Date  . Cholecystectomy   . Abdominal hysterectomy   . Oophrectomy     OT Assessment/Plan/Recommendation OT Assessment Clinical Impression Statement: Pt admitted for syncope precipitated by dizziness and presents to OT with decreased I with basic ADLs. Will benefit from skilled OT in the acute setting to maximize I with ADL and ADL mobility to Mod I- I level upon d/c.  OT Recommendation/Assessment: Patient will need skilled OT in the acute care venue OT Problem List: Decreased activity tolerance;Decreased knowledge of use of DME or AE;Decreased knowledge of precautions;Pain OT Therapy Diagnosis : Generalized weakness;Acute pain OT Plan OT Frequency: Min 2X/week OT Treatment/Interventions: Self-care/ADL training;DME and/or AE instruction;Therapeutic activities;Patient/family education;Balance training OT Recommendation Follow Up Recommendations: No OT follow up Equipment Recommended: None recommended by OT (may benefit from tub/shower  seat) Individuals Consulted Consulted and Agree with Results and Recommendations: Patient OT Goals Acute Rehab OT Goals OT Goal Formulation: With patient Time For Goal Achievement: 7 days ADL Goals Pt Will Perform Upper Body Bathing: with modified independence;Sit to stand in shower ADL Goal: Upper Body Bathing - Progress: Goal set today Pt Will Perform Lower Body Bathing: with modified independence;Sit to stand in shower;Sit to stand from chair ADL Goal: Lower Body Bathing - Progress: Goal set today Pt Will Perform Lower Body Dressing: Independently;Sit to stand from chair;Sit to stand from bed ADL Goal: Lower Body Dressing - Progress: Goal set today Pt Will Transfer to Toilet: Independently;Regular height toilet ADL Goal: Toilet Transfer - Progress: Goal set today Pt Will Perform Toileting - Clothing Manipulation: Independently;Standing ADL Goal: Toileting - Clothing Manipulation - Progress: Goal set today Pt Will Perform Tub/Shower Transfer: Tub transfer;Ambulation;Grab bars;with modified independence ADL Goal: Tub/Shower Transfer - Progress: Goal set today Additional ADL Goal #1: Pt will be I with item retrieval in room in prep for ADLs. ADL Goal: Additional Goal #1 - Progress: Goal set today  OT Evaluation Precautions/Restrictions  Precautions Precautions: Fall Restrictions Weight Bearing Restrictions: No Prior Functioning Home Living Lives With: Spouse Receives Help From: Family Type of Home: House Home Layout: One level Home Access: Stairs to enter Entrance Stairs-Rails: Can reach both Entrance Stairs-Number of Steps: 3 Bathroom Shower/Tub: Engineer, manufacturing systems: Standard Home Adaptive Equipment: Straight cane Prior Function Comments: States dtr had been helping her get down into and out of the tub and also had been occasionally assisting her with LB dsg as she sometimes would become dizzy when bending down ADL ADL Eating/Feeding:  Simulated;Independent Where  Assessed - Eating/Feeding: Edge of bed Grooming: Performed;Wash/dry hands;Supervision/safety Where Assessed - Grooming: Standing at sink Upper Body Bathing: Simulated;Supervision/safety;Set up Where Assessed - Upper Body Bathing: Standing at sink Lower Body Bathing: Simulated;Supervision/safety;Set up Lower Body Bathing Details (indicate cue type and reason): did not include feet Where Assessed - Lower Body Bathing: Standing at sink Upper Body Dressing: Performed;Supervision/safety;Set up Upper Body Dressing Details (indicate cue type and reason): gown Where Assessed - Upper Body Dressing: Sitting, bed Lower Body Dressing: Performed;Set up;Supervision/safety Lower Body Dressing Details (indicate cue type and reason): don/doff both socks Where Assessed - Lower Body Dressing: Sitting, bed Toilet Transfer: Performed;Supervision/safety Toilet Transfer Details (indicate cue type and reason): Supervision with RW ambulation with VC to remain within the walker. Pt Min HHA with ambulation without RW Toilet Transfer Method: Proofreader: Regular height toilet;Grab bars Toileting - Clothing Manipulation: Performed;Supervision/safety Where Assessed - Glass blower/designer Manipulation: Standing Toileting - Hygiene: Performed;Supervision/safety Where Assessed - Toileting Hygiene: Sit on 3-in-1 or toilet Tub/Shower Transfer: Simulated;Minimal assistance Tub/Shower Transfer Details (indicate cue type and reason): simulated in room with trash can. pt required Min A to steady Equipment Used: Rolling walker Ambulation Related to ADLs: see toilet transfer Vision/Perception  Vision - History Patient Visual Report: No change from baseline Cognition Cognition Overall Cognitive Status: Appears within functional limits for tasks assessed Sensation/Coordination Sensation Light Touch: Appears Intact Coordination Gross Motor Movements are Fluid and  Coordinated: Yes Fine Motor Movements are Fluid and Coordinated: Yes Extremity Assessment RUE Assessment RUE Assessment: Within Functional Limits LUE Assessment LUE Assessment: Within Functional Limits   End of Session OT - End of Session Equipment Utilized During Treatment: Gait belt Activity Tolerance: Patient tolerated treatment well Patient left: in bed;with bed alarm set;with call bell in reach General Behavior During Session: [facility] for tasks performed   Qiara Minetti 01/06/2012, 11:11 AM

## 2012-01-06 NOTE — Progress Notes (Signed)
Subjective: Patient states feeling better. Patient with no nausea nor emesis this,  Abd pain improved. Patient states no BM yesterday or today. No syncope. Chest tightness improving per patient.  Objective: Vital signs in last 24 hours: Filed Vitals:   01/05/12 0645 01/05/12 1442 01/05/12 2057 01/06/12 0518  BP:  116/76 112/76 113/77  Pulse:  81 86 79  Temp:  97 F (36.1 C) 98 F (36.7 C) 98.1 F (36.7 C)  TempSrc:  Oral Oral Oral  Resp:  16 18 18   Height:      Weight: 110.814 kg (244 lb 4.8 oz)   108.41 kg (239 lb)  SpO2:  100% 99% 99%    Intake/Output Summary (Last 24 hours) at 01/06/12 1225 Last data filed at 01/06/12 0700  Gross per 24 hour  Intake    240 ml  Output   2100 ml  Net  -1860 ml    Weight change: -2.404 kg (-5 lb 4.8 oz)  General: Alert, awake, oriented x3, in no acute distress. Heart: Regular rate and rhythm, without murmurs, rubs, gallops. Lungs: Clear to auscultation bilaterally. Abdomen: Soft, nontender, nondistended, positive bowel sounds. Extremities: No clubbing cyanosis or edema with positive pedal pulses. Neuro: Grossly intact, nonfocal.   Lab Results:  Basename 01/05/12 0600 01/04/12 0500 01/03/12 2049  NA 140 136 --  K 3.6 3.5 --  CL 105 102 --  CO2 27 24 --  GLUCOSE 89 89 --  BUN 7 9 --  CREATININE 0.51 0.51 --  CALCIUM 8.6 8.4 --  MG -- -- 2.2  PHOS -- -- --    Basename 01/05/12 0600 01/04/12 0500  AST 152* 362*  ALT 221* 241*  ALKPHOS 70 59  BILITOT 0.2* 0.3  PROT 6.6 6.5  ALBUMIN 2.9* 2.9*    Basename 01/03/12 1322  LIPASE 27  AMYLASE --    Basename 01/05/12 0600 01/04/12 0500  WBC 4.1 3.2*  NEUTROABS 1.7 --  HGB 11.1* 10.5*  HCT 34.6* 33.0*  MCV 77.9* 79.3  PLT 290 281    Basename 01/04/12 1245 01/04/12 0640 01/03/12 2049  CKTOTAL 91 79 83  CKMB 1.2 1.2 1.2  CKMBINDEX -- -- --  TROPONINI <0.30 <0.30 <0.30   No components found with this basename: POCBNP:3  Basename 01/03/12 1844  DDIMER 0.25   No  results found for this basename: HGBA1C:2 in the last 72 hours  Basename 01/04/12 0500  CHOL 196  HDL 65  LDLCALC 115*  TRIG 80  CHOLHDL 3.0  LDLDIRECT --    Basename 01/03/12 2049  TSH 2.492  T4TOTAL --  T3FREE --  THYROIDAB --    Basename 01/04/12 0946  VITAMINB12 591  FOLATE 13.1  FERRITIN 302*  TIBC 314  IRON 72  RETICCTPCT --    Micro Results: Recent Results (from the past 240 hour(s))  URINE CULTURE     Status: Normal   Collection Time   01/03/12 11:40 PM      Component Value Range Status Comment   Specimen Description URINE, RANDOM   Final    Special Requests NONE   Final    Culture  Setup Time [id number]   Final    Colony Count 2,000 COLONIES/ML   Final    Culture INSIGNIFICANT GROWTH   Final    Report Status 01/05/2012 FINAL   Final     Studies/Results: US Abdomen Complete  01/05/2012  *RADIOLOGY REPORT*  Clinical Data:  Increased liver function enzymes  COMPLETE ABDOMINAL ULTRASOUND  Comparison:  CT abdomen 10/21/2011  Findings:  Gallbladder:  Surgically absent  Common bile duct:  13 mm.  This is increased from 7 mm on comparison CT  Liver:  Increase in liver echogenicity.  No ductal dilatation.  IVC:  Appears normal.  Pancreas:  Dilated pancreatic duct 5 mm.  Spleen:  Normal size and echogenicity.  Right Kidney:  10.0cm in length.  No evidence of hydronephrosis or stones.  Left Kidney:  11.0cm in length.  No evidence of hydronephrosis or stones.  Abdominal aorta:  No aneurysm identified.  IMPRESSION:  1.  Interval increase in diameter of common bile duct and pancreatic duct compared to CT of 10/21/2011.  I am not convinced that the common bile duct measurement is accurate.  If concern for obstruction, consider  MRCP.   2.  Echogenic liver commonly represent hepatic steatosis.  Original Report Authenticated By: Genevive Bi, M.D.    Medications:    . cyclobenzaprine  10 mg Oral QHS  . enoxaparin  55 mg Subcutaneous QHS  . FLUoxetine  20 mg Oral BID  .  mesalamine  4.8 g Oral Q breakfast  . pantoprazole  40 mg Oral Q0600  . phenytoin  200 mg Oral BID  . phenytoin  400 mg Oral Once  . sodium chloride  3 mL Intravenous Q12H  . triamterene-hydrochlorothiazide  1 each Oral Daily    Assessment: Principal Problem:  *Syncope and collapse Active Problems:  Chest pain  SOB (shortness of breath)  HTN (hypertension), benign  Hx of migraines  Seizure  Depression  Ulcerative colitis  Chronic back pain  Hyperlipidemia  Anemia  Transaminitis  Diarrhea   Plan: 1 syncope and collapse  Differential includes cardiogenic versus neurological . CT of the head is negative. Carotid Dopplers prelim neg.  Patient is not orthostatic. Cardiac enzymes neg x 3. 2-D echo with EF of 65-70%. No wall motion abnormalities. .  EEG pending. No arrhythmias noted on telemetry. No further episodes of syncope. Follow.  #2 chest pain  Atypical in nature. No chest pain today. Cardiac enzymes negative x3. 2-D echo EF of 65-70%.NWMA. LDL is 115. D-dimer is negative. May be anxiety related.   #3 shortness of breath  Chest x-ray is negative. d-dimer negative. Cardiac enzymes negative x3. 2-D echo with normal EF. No wall motion abnormalities. Clinically improved..  Will follow.  #4 history of seizures  Continue home dose Dilantin.  Dilantin level subtherapeutic. We'll have pharmacy dose Dilantin. EEG pending. Dilantin dose per pharmacy. will need to followup with neurology as outpatient.  #5 history of ulcerative colitis  Patient states no further diarrhea, and abdominal cramping improving and some nausea and emesis. Abd [place] with dilated CBD and PD. Patient changed to lialda.  MRCP pending per GI recommendations. Per GI ff. #6 hyperlipidemia  LDL of 115. Patient states was to reassess with her PCP in a few weeks to decide when to start a statindifferential LFTs have improved . We'll have patient followup with PCP.  #7 transaminitis Questionable etiology. Marland Kitchen abdominal  ultrasound with dilated CBD and PD.  acute hepatitis panel which HCV antibody positive. Likely chronic. Patient will need a hepatitis C mRNA  quantitative lab. Which will need to be followed up as outpatient. Gi FF. #8  normocytic anemia No evidence of overt GI bleed. follow H&H.  #9 prophylaxis  PPI for GI prophylaxis. Lovenox for DVT prophylaxis.       LOS: 3 days   [facility] 01/06/2012, 12:25 PM

## 2012-01-06 NOTE — Evaluation (Signed)
Physical Therapy Evaluation Patient Details Name: Erin Wolf MRN: [id number] DOB: December 20, 1955 Today's Date: 01/06/2012  Problem List:  Patient Active Problem List  Diagnoses  . Syncope and collapse  . Chest pain  . SOB (shortness of breath)  . HTN (hypertension), benign  . Hx of migraines  . Seizure  . Depression  . Ulcerative colitis  . Chronic back pain  . Dysthymic disorder  . Hyperlipidemia  . Hepatitis C  . Fibroids  . Anemia  . Transaminitis  . Diarrhea    Past Medical History:  Past Medical History  Diagnosis Date  . Hypertension   . Ulcerative colitis   . Migraines   . Seizure disorder   . HTN (hypertension), benign 01/03/2012  . Hx of migraines 01/03/2012  . Seizure 01/03/2012  . Depression 01/03/2012  . Chronic back pain 01/03/2012  . 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 Hyperlipidemia 01/03/2012  . Hepatitis C 01/03/2012  . Fibroids 01/03/2012   Past Surgical History:  Past Surgical History  Procedure Date  . Cholecystectomy   . Abdominal hysterectomy   . Oophrectomy     PT Assessment/Plan/Recommendation PT Assessment Clinical Impression Statement: Pt admitted with syncope demonstrates balance deficits and difficulty with visual tracking particularly with RAM of vertical movement. Pt with dizziness with bending forward, and with left sidely to sit. Nystagmus noted with right and left gaze but no dizziness reported with these. Will follow acutely to maximize balance independence and mobility before return home. Recommend daily ambulation with assistance and use of cane for mobility at home.  PT Recommendation/Assessment: Patient will need skilled PT in the acute care venue PT Problem List: Decreased activity tolerance;Decreased balance;Decreased knowledge of use of DME Barriers to Discharge: None PT Therapy Diagnosis : Abnormality of gait PT Plan PT Frequency: Min 3X/week PT Treatment/Interventions: Gait training;Functional mobility training;DME  instruction;Balance training;Therapeutic activities;Patient/family education PT Recommendation Follow Up Recommendations: Outpatient PT Equipment Recommended: None recommended by PT PT Goals  Acute Rehab PT Goals PT Goal Formulation: With patient Time For Goal Achievement: 7 days Pt will Ambulate: >150 feet;with modified independence;with least restrictive assistive device PT Goal: Ambulate - Progress: Goal set today Additional Goals Additional Goal #1: Pt will score > or equal to 50 on Berg to decrease fall risk. PT Goal: Additional Goal #1 - Progress: Goal set today  PT Evaluation Precautions/Restrictions  Precautions Precautions: Fall Restrictions Weight Bearing Restrictions: No Prior Functioning  Home Living Lives With: Spouse Receives Help From: Family Type of Home: House Home Layout: One level Home Access: Stairs to enter Entrance Stairs-Rails: Can reach both Entrance Stairs-Number of Steps: 4 Bathroom Shower/Tub: Engineer, manufacturing systems: Standard Home Adaptive Equipment: Straight cane Additional Comments: Pt states she can borrow RW from a friend Prior Function Level of Independence: Independent with basic ADLs;Needs assistance with tranfers;Independent with gait;Needs assistance with homemaking Light Housekeeping: Moderate Vocation: On disability Comments: spouse and dgtr assist with tub transfer Cognition Cognition Arousal/Alertness: Awake/alert Overall Cognitive Status: Appears within functional limits for tasks assessed Sensation/Coordination Sensation Light Touch: Appears Intact Coordination Gross Motor Movements are Fluid and Coordinated: Yes Fine Motor Movements are Fluid and Coordinated: Yes Extremity Assessment RUE Assessment RUE Assessment: Within Functional Limits LUE Assessment LUE Assessment: Within Functional Limits RLE Assessment RLE Assessment: Exceptions to [facility] RLE AROM (degrees) RLE Overall AROM Comments: ROM limited by body  habitus LLE Assessment LLE Assessment: Exceptions to WFL LLE AROM (degrees) LLE Overall AROM Comments: ROM limited by body habitus Mobility (including Balance) Bed Mobility Bed Mobility:  Yes Right Sidelying to Sit: 6: Modified independent (Device/Increase time);HOB flat Left Sidelying to Sit: 6: Modified independent (Device/Increase time);HOB flat Sit to Supine: 6: Modified independent (Device/Increase time);HOB flat Sit to Sidelying Right: 6: Modified independent (Device/Increase time);HOB flat Sit to Sidelying Left: 6: Modified independent (Device/Increase time);HOB flat Transfers Transfers: Yes Sit to Stand: 7: Independent Stand to Sit: 7: Independent Stand Pivot Transfers: 6: Modified independent (Device/Increase time) Ambulation/Gait Ambulation/Gait: Yes Ambulation/Gait Assistance: 4: Min assist Ambulation/Gait Assistance Details (indicate cue type and reason): minguard with pt scissoring x 2 and decreased speed from baseline Ambulation Distance (Feet): 400 Feet Assistive device: None Gait Pattern: Decreased stride length;Step-through pattern;Scissoring Stairs: Yes Stairs Assistance: 6: Modified independent (Device/Increase time) Stair Management Technique: One rail Right Number of Stairs: 11  Height of Stairs: 8   Berg Balance Test Sit to Stand: Able to stand without using hands and stabilize independently Standing Unsupported: Able to stand safely 2 minutes Sitting with Back Unsupported but Feet Supported on Floor or Stool: Able to sit safely and securely 2 minutes Stand to Sit: Sits safely with minimal use of hands Transfers: Able to transfer safely, minor use of hands Standing Unsupported with Eyes Closed: Able to stand 10 seconds with supervision Standing Ubsupported with Feet Together: Able to place feet together independently and stand 1 minute safely From Standing, Reach Forward with Outstretched Arm: Can reach forward >12 cm safely (5") From Standing Position,  Pick up Object from Floor: Able to pick up shoe, needs supervision From Standing Position, Turn to Look Behind Over each Shoulder: Looks behind from both sides and weight shifts well Turn 360 Degrees: Able to turn 360 degrees safely one side only in 4 seconds or less Standing Unsupported, Alternately Place Feet on Step/Stool: Able to complete >2 steps/needs minimal assist Standing Unsupported, One Foot in Front: Able to take small step independently and hold 30 seconds Standing on One Leg: Tries to lift leg/unable to hold 3 seconds but remains standing independently Total Score: 44  Exercise    End of Session PT - End of Session Equipment Utilized During Treatment: Gait belt Activity Tolerance: Patient tolerated treatment well Patient left: in bed;with call bell in reach Nurse Communication: Mobility status for transfers General Behavior During Session: [facility] for tasks performed Cognition: [facility] for tasks performed  Delorse Lek 01/06/2012, 11:26 AM  Delaney Meigs, PT [phone]

## 2012-01-06 NOTE — Progress Notes (Signed)
01/06/2012 [facility], Bosie Clos SPARKS Case Management Note [phone]    CARE MANAGEMENT NOTE 01/06/2012  Patient:  Erin Wolf, Erin Wolf   Account Number:  [id number]  Date Initiated:  01/06/2012  Documentation initiated by:  Fransico Michael  Subjective/Objective Assessment:   admitted on 01/03/12 with syncope and fall     Action/Plan:   prior to admission, patient lived at home with spouse and was independent with ADLs   Anticipated DC Date:  01/09/2012   Anticipated DC Plan:  HOME/SELF CARE      DC Planning Services  CM consult      Choice offered to / List presented to:             Status of service:  In process, will continue to follow Medicare Important Message given?   (If response is "NO", the following Medicare IM given date fields will be blank) Date Medicare IM given:   Date Additional Medicare IM given:    Discharge Disposition:    Per UR Regulation:  Reviewed for med. necessity/level of care/duration of stay  If discussed at Long Length of Stay Meetings, dates discussed:    Comments:  PCP: HARRIS,WILLIAM RANDALL  Contact: Shimika Ames (spouse) [phone]

## 2012-01-06 NOTE — Progress Notes (Signed)
Utilization review completed.  

## 2012-01-07 ENCOUNTER — Inpatient Hospital Stay (HOSPITAL_COMMUNITY): Payer: Medicare Other

## 2012-01-07 DIAGNOSIS — R197 Diarrhea, unspecified: Secondary | ICD-10-CM | POA: Diagnosis not present

## 2012-01-07 DIAGNOSIS — R079 Chest pain, unspecified: Secondary | ICD-10-CM | POA: Diagnosis not present

## 2012-01-07 DIAGNOSIS — R569 Unspecified convulsions: Secondary | ICD-10-CM | POA: Diagnosis not present

## 2012-01-07 DIAGNOSIS — R55 Syncope and collapse: Secondary | ICD-10-CM | POA: Diagnosis not present

## 2012-01-07 LAB — CBC
HCT: 36.1 % (ref 36.0–46.0)
Hemoglobin: 11.6 g/dL — ABNORMAL LOW (ref 12.0–15.0)
MCH: 25.1 pg — ABNORMAL LOW (ref 26.0–34.0)
MCHC: 32.1 g/dL (ref 30.0–36.0)
MCV: 78.1 fL (ref 78.0–100.0)
RDW: 16.4 % — ABNORMAL HIGH (ref 11.5–15.5)

## 2012-01-07 LAB — COMPREHENSIVE METABOLIC PANEL
ALT: 124 U/L — ABNORMAL HIGH (ref 0–35)
AST: 51 U/L — ABNORMAL HIGH (ref 0–37)
Alkaline Phosphatase: 71 U/L (ref 39–117)
GFR calc Af Amer: >90 mL/min (ref >90)
Glucose, Bld: 101 mg/dL — ABNORMAL HIGH (ref 70–99)
Potassium: 3.8 mEq/L (ref 3.5–5.1)
Sodium: 138 mEq/L (ref 135–145)
Total Protein: 7.2 g/dL (ref 6.0–8.3)

## 2012-01-07 LAB — HCV RNA QUANT

## 2012-01-07 MED ORDER — MESALAMINE 1.2 G PO TBEC: 4.8000 g | DELAYED_RELEASE_TABLET | Freq: Every day | ORAL | Status: DC

## 2012-01-07 MED ORDER — PROMETHAZINE HCL 25 MG PO TABS
25.0000 mg | ORAL_TABLET | Freq: Four times a day (QID) | ORAL | Status: DC | PRN
Start: 2012-01-07 — End: 2012-01-07
  Administered 2012-01-07: 25 mg via ORAL
  Filled 2012-01-07: qty 1

## 2012-01-07 MED ORDER — LORAZEPAM 0.5 MG PO TABS: 0.5000 mg | ORAL_TABLET | Freq: Three times a day (TID) | ORAL | Status: DC | PRN

## 2012-01-07 NOTE — Progress Notes (Signed)
Pt ambulated 250 feet in hall with standby assist.

## 2012-01-07 NOTE — Discharge Summary (Signed)
Discharge Summary  Erin Wolf MR#: [id number]  DOB:Mar 10, 1956  Date of Admission: 01/03/2012 Date of Discharge: 01/07/2012  Patient's PCP: [name], MD, MD  Attending Physician:Donnis Pecha  Consults: Treatment Team: [facility] gastroenterology: Dr. Glenna Durand, MD   Discharge Diagnoses: Syncope and collapse Present on Admission:  .Syncope and collapse .Chest pain .SOB (shortness of breath) .HTN (hypertension), benign .Seizure .Depression .Ulcerative colitis .Chronic back pain .Hyperlipidemia  Brief Admitting History and Physical Erin Wolf is a 56 year old obese African American female with history of hypertension, ulcerative colitis, depression, seizure disorder who presents from her cardiologist's office with a syncopal episode. Patient states that she's had 3 episodes of syncope recently preceded by some dizziness and some associated palpitations. Patient stated that she also does endorse some chest tightness as well which is nonradiating. Patient states that usually after her syncopal episodes she remains confused for a while. Patient does endorse some generalized shortness of breath and fatigue. Patient presented to her cardiologist's office on the day of admission was on the way to get an echo walking down the hallway when she stated that she passed out and she felt a little bit dizzy prior to passing out. Patient was subsequently sent to the ED. Patient is also complaining of a three-day history of diarrhea which has mucus in nature nonbloody with some abdominal cramping nausea and episode of emesis. Patient does endorse some chills. Patient denies any fever, no cough, no dysuria. Patient also complaining of occasional intermittent left lower extremity pain shooting down her leg. Patient was seen in the ED be met which was obtained showed a potassium of 3.3 otherwise within normal limits. Chest x-ray was done which was negative. Head CT was not done.  Abdominal x-rays was not done. First set of troponin was negative. CBC which was obtained was unremarkable. Will call to admit the patient for further evaluation and management.   Discharge Medications Medication List  As of 01/07/2012  7:43 PM   START taking these medications         LORazepam 0.5 MG tablet   Commonly known as: ATIVAN   Take 1 tablet (0.5 mg total) by mouth every 8 (eight) hours as needed for anxiety.      mesalamine 1.2 G EC tablet   Commonly known as: LIALDA   Take 4 tablets (4.8 g total) by mouth daily with breakfast.         CONTINUE taking these medications         acetaminophen-codeine 300-30 MG per tablet   Commonly known as: TYLENOL #3      cyclobenzaprine 10 MG tablet   Commonly known as: FLEXERIL      FLUoxetine 20 MG tablet   Commonly known as: PROZAC      MAXZIDE-25 37.5-25 MG per tablet   Generic drug: triamterene-hydrochlorothiazide      phenytoin 100 MG ER capsule   Commonly known as: DILANTIN      promethazine 25 MG tablet   Commonly known as: PHENERGAN         STOP taking these medications         Rowasa 4 G Kit          Where to get your medications    These are the prescriptions that you need to pick up.   You may get these medications from any pharmacy.         LORazepam 0.5 MG tablet   mesalamine 1.2 G EC tablet  Hospital Course: Syncope and collapse Erin Wolf was admitted after she had a syncopal episode at her cardiologist's office. Patient was placed on telemetry and she was monitored. Orthostatics were checked and were negative. And head CT was obtained which had no acute abnormalities. Carotid Dopplers also obtained which were negative. Patient was meant monitored on telemetry and no arrhythmias were noted during the hospitalization. Cardiac enzymes which was cycled were negative x3. 2-D echo which was obtained did have an ejection fraction of 65-70%. There was no regional wall motion abnormalities.  Patient did not have any further syncopal episodes during the hospitalization. On admission patient was noted to be on Dilantin Dilantin levels which were checked for subtherapeutic. Patient was loaded with Dilantin and a repeat Dilantin level was checked which was therapeutic. Patient be discharged home on a regular dose of Dilantin 200 mg twice daily and will need a repeat level done one week post discharge. An EEG was obtained results are pending at the time of discharge. Patient did not have any further episodes and patient was discharged home in stable and improved condition.  #2 chest pain On admission patient did have some complaints of chest pain cardiac enzymes were cycled which were negative x3. EKG obtained did not show any ST-T wave elevation. A 2-D echo was obtained with a normal ejection fraction was no wall motion abnormalities. Patient's chest pain improved during the hospitalization and may have had a component of anxiety. Patient will followup with her cardiologist as outpatient.  #3 seizures On admission patient was noted to have a history of seizures. Patient Dilantin levels were checked which was subtherapeutic. Pharmacy dose the patient Dilantin and patient was placed back on a home regimen. A repeat Dilantin level which was obtained was therapeutic. Patient will be discharged home on her home dose of Dilantin will followup with PCP one week post discharge for Dilantin level. Patient will need to schedule outpatient followup with neurology to monitor his seizures.  #4 ulcerative colitis On admission patient did have some complaints of abdominal cramping and diarrhea. A GI consultation was obtained patient was seen in consultation by [facility]. [facility]. Patient initially had been on Rowasa enemas. These were discontinued and patient was placed on by Lialda. Patient did not have any further diarrhea her abdominal pain improved she'll be discharged home on Lialda.  Patient will followup with a gastroenterologist Dr. Dulce Sellar as a outpatient.  #5 transaminitis On admission patient was noted to have transaminitis. Abdominal x-ray which was obtained was negative. Abdominal ultrasound which was also obtained did show some biliary duct and pancreatic duct dilatation. An acute hepatitis panel was obtained and was consistent with a chronic hepatitis C. Patient will need RNA levels checked as outpatient. A MRCP was also obtained which had no acute abnormalities. Patient will [facility] with [facility]. Patient's LFTs trended down and these will need to be followed up as outpatient.  The rest of patient's chronic medical issues remained stable throughout the hospitalization.  Present on Admission:  .Syncope and collapse .Chest pain .SOB (shortness of breath) .HTN (hypertension), benign .Seizure .Depression .Ulcerative colitis .Chronic back pain .Hyperlipidemia   Day of Discharge BP 120/80  Pulse 96  Temp(Src) 98.1 F (36.7 C) (Oral)  Resp 18  Ht 5\' 5"  (1.651 m)  Wt 106.051 kg (233 lb 12.8 oz)  BMI 38.91 kg/m2  SpO2 95% Subjective: Patient complaining of headache. No diarrhea. No abdominal pain. No chest pain. No syncopal episode. General: Alert,  awake, oriented x3, in no acute distress. Heart: Regular rate and rhythm, without murmurs, rubs, gallops. Lungs: Clear to auscultation bilaterally. Abdomen: Soft, nontender, nondistended, positive bowel sounds. Extremities: No clubbing cyanosis or edema with positive pedal pulses.   Results for orders placed during the hospital encounter of 01/03/12 (from the past 48 hour(s))  HCV RNA QUANT     Status: Abnormal   Collection Time   01/06/12  9:40 AM      Component Value Range Comment   HCV Quantitative Not Detected (*) <43 (IU/mL)    HCV Quantitative Log NOT CALC  <1.63 (log 10)   COMPREHENSIVE METABOLIC PANEL     Status: Abnormal   Collection Time   01/07/12  5:00 AM       Component Value Range Comment   Sodium 138  135 - 145 (mEq/L)    Potassium 3.8  3.5 - 5.1 (mEq/L)    Chloride 103  96 - 112 (mEq/L)    CO2 25  19 - 32 (mEq/L)    Glucose, Bld 101 (*) 70 - 99 (mg/dL)    BUN 7  6 - 23 (mg/dL)    Creatinine, Ser 6.21  0.50 - 1.10 (mg/dL)    Calcium 9.0  8.4 - 10.5 (mg/dL)    Total Protein 7.2  6.0 - 8.3 (g/dL)    Albumin 3.1 (*) 3.5 - 5.2 (g/dL)    AST 51 (*) 0 - 37 (U/L)    ALT 124 (*) 0 - 35 (U/L)    Alkaline Phosphatase 71  39 - 117 (U/L)    Total Bilirubin 0.1 (*) 0.3 - 1.2 (mg/dL)    GFR calc non Af Amer >90  >90 (mL/min)    GFR calc Af Amer >90  >90 (mL/min)   CBC     Status: Abnormal   Collection Time   01/07/12  5:00 AM      Component Value Range Comment   WBC 5.6  4.0 - 10.5 (K/uL)    RBC 4.62  3.87 - 5.11 (MIL/uL)    Hemoglobin 11.6 (*) 12.0 - 15.0 (g/dL)    HCT 30.8  65.7 - 84.6 (%)    MCV 78.1  78.0 - 100.0 (fL)    MCH 25.1 (*) 26.0 - 34.0 (pg)    MCHC 32.1  30.0 - 36.0 (g/dL)    RDW 96.2 (*) 95.2 - 15.5 (%)    Platelets 313  150 - 400 (K/uL)     Dg Chest 2 View  01/03/2012  *RADIOLOGY REPORT*  Clinical Data: Chest pain and shortness of breath.  CHEST - 2 VIEW  Comparison: 01/23/2011.  Findings: The cardiac silhouette, mediastinal and hilar contours are within normal limits and stable.  Slightly low lung volumes with mild vascular crowding but no infiltrates, edema or effusions. The bony thorax is intact.  IMPRESSION: Slightly low lung volumes with mild vascular crowding and atelectasis but no infiltrates, edema or effusions.  Original Report Authenticated By: P. Loralie Champagne, M.D.   Dg Abd 1 View  01/03/2012  *RADIOLOGY REPORT*  Clinical Data: Abdominal pain  ABDOMEN - 1 VIEW  Comparison: CT 10/21/2011  Findings: No dilated loops of large or small bowel.  No pathologic calcifications.  No acute osseous abnormality.  IMPRESSION: No evidence of bowel obstruction or bowel abnormality by plain film.  Original Report Authenticated By:  Genevive Bi, M.D.   Ct Head Wo Contrast  01/03/2012  *RADIOLOGY REPORT*  Clinical Data: Syncope  CT HEAD WITHOUT CONTRAST  Technique:  Contiguous axial images were obtained from the base of the skull through the vertex without contrast.  Comparison: Head CT 11/03/2011  Findings: No acute intracranial hemorrhage.  No focal mass lesion. No CT evidence of acute infarction.   No midline shift or mass effect.  No hydrocephalus.  Basilar cisterns are patent. Paranasal sinuses and mastoid air cells are clear.  Orbits are normal.  IMPRESSION: No acute intracranial findings.  No change in short interval follow- up  Original Report Authenticated By: Genevive Bi, M.D.   US Abdomen Complete  01/05/2012  *RADIOLOGY REPORT*  Clinical Data:  Increased liver function enzymes  COMPLETE ABDOMINAL ULTRASOUND  Comparison:  CT abdomen 10/21/2011  Findings:  Gallbladder:  Surgically absent  Common bile duct:  13 mm.  This is increased from 7 mm on comparison CT  Liver:  Increase in liver echogenicity.  No ductal dilatation.  IVC:  Appears normal.  Pancreas:  Dilated pancreatic duct 5 mm.  Spleen:  Normal size and echogenicity.  Right Kidney:  10.0cm in length.  No evidence of hydronephrosis or stones.  Left Kidney:  11.0cm in length.  No evidence of hydronephrosis or stones.  Abdominal aorta:  No aneurysm identified.  IMPRESSION:  1.  Interval increase in diameter of common bile duct and pancreatic duct compared to CT of 10/21/2011.  I am not convinced that the common bile duct measurement is accurate.  If concern for obstruction, consider  MRCP.   2.  Echogenic liver commonly represent hepatic steatosis.  Original Report Authenticated By: Genevive Bi, M.D.   Mr 3d Recon At Scanner  01/06/2012  *RADIOLOGY REPORT*  Clinical Data:  Elevated liver function tests.  Abdominal pain. Biliary dilatation.  MRI ABDOMEN WITHOUT AND WITH CONTRAST (MRCP)  Technique:  Multiplanar multisequence MR imaging of the abdomen was  performed without and with contrast, including heavily T2-weighted images of the biliary and pancreatic ducts.  Three-dimensional MR images were rendered by post processing of the original MR data.  Contrast: 20mL MULTIHANCE GADOBENATE DIMEGLUMINE 529 MG/ML IV SOLN  Comparison:  CT on 10/21/2011  Findings:  Gallbladder is surgically absent.  There is mild dilatation of the common bile duct and proximal intrahepatic bile ducts, with the common duct measuring 12 mm proximally.  There is gradual tapering of the common bile duct to the level ampulla. There is no evidence of choledocholithiasis or biliary stricture. There is mild diffuse dilatation of the pancreatic duct, without significant side branch dilatation.  There is no evidence of pancreas divisum.  There is no evidence of pancreatic mass or peripancreatic inflammatory changes.  No liver masses are identified. Mild hepatic steatosis noted.  There is no evidence of abdominal lymphadenopathy other soft tissue masses.  The adrenal glands, kidneys, and spleen are normal in appearance. No evidence of inflammatory process or abnormal fluid collections. Visualized abdominal bowel loops are nondilated.  IMPRESSION:  1. Prior cholecystectomy, with mild diffuse biliary and pancreatic ductal dilatation.  No etiology is apparent by MRI. 2.  No mass or lymphadenopathy identified within pelvis. 3.  Mild hepatic steatosis.  Original Report Authenticated By: Danae Orleans, M.D.   Mr Abd W/wo Cm/mrcp  01/06/2012  *RADIOLOGY REPORT*  Clinical Data:  Elevated liver function tests.  Abdominal pain. Biliary dilatation.  MRI ABDOMEN WITHOUT AND WITH CONTRAST (MRCP)  Technique:  Multiplanar multisequence MR imaging of the abdomen was performed without and with contrast, including heavily T2-weighted images of the biliary and pancreatic ducts.  Three-dimensional MR images were rendered by  post processing of the original MR data.  Contrast: 20mL MULTIHANCE GADOBENATE DIMEGLUMINE 529  MG/ML IV SOLN  Comparison:  CT on 10/21/2011  Findings:  Gallbladder is surgically absent.  There is mild dilatation of the common bile duct and proximal intrahepatic bile ducts, with the common duct measuring 12 mm proximally.  There is gradual tapering of the common bile duct to the level ampulla. There is no evidence of choledocholithiasis or biliary stricture. There is mild diffuse dilatation of the pancreatic duct, without significant side branch dilatation.  There is no evidence of pancreas divisum.  There is no evidence of pancreatic mass or peripancreatic inflammatory changes.  No liver masses are identified. Mild hepatic steatosis noted.  There is no evidence of abdominal lymphadenopathy other soft tissue masses.  The adrenal glands, kidneys, and spleen are normal in appearance. No evidence of inflammatory process or abnormal fluid collections. Visualized abdominal bowel loops are nondilated.  IMPRESSION:  1. Prior cholecystectomy, with mild diffuse biliary and pancreatic ductal dilatation.  No etiology is apparent by MRI. 2.  No mass or lymphadenopathy identified within pelvis. 3.  Mild hepatic steatosis.  Original Report Authenticated By: Danae Orleans, M.D.     Disposition: Home  Diet: Regular  Activity: Increase activity slowly   Follow-up Appts: Discharge Orders    Future Orders Please Complete By Expires   Diet general      Increase activity slowly      Discharge instructions      Comments:   Follow up with [name], MD, in 1 week Follow up with Dr Dulce Sellar in 1-2 weeks Follow up with Dr Eldridge Dace in 1-2 weeks Follow up with [facility] in 1-2 weeks.       TESTS THAT NEED FOLLOW-UP EEG. Dilantin level needs to be checked in 1 week. On followup with cardiology at [facility] metabolic profile will need to be checked. And a hepatitis CmRNA will also need to be checked.  Time spent on discharge, talking to the patient, and coordinating care: 60  mins.   SignedRamiro Harvest 01/07/2012, 7:43 PM

## 2012-01-07 NOTE — Progress Notes (Signed)
MRCP okay LFTs continue to drop probably okay to followup at my partner Dr. Dulce Sellar as an outpatient and he can check on the quantitative mRNA hepatitis C test please call me if I can be of any further assistance

## 2012-01-07 NOTE — Progress Notes (Signed)
Patient discharged home per MD order. Patient verbalized understanding of home medications, discharge instructions and follow up appointments. Patient was given her prescriptions. Husband was present at the bedside during discharge teaching. Patient escorted to car in wheelchair by nursing assistant.

## 2012-01-07 NOTE — Progress Notes (Signed)
Physical Therapy Cancellation Note: Pt states she has a migraine today and does not feel up for mobility. Pt educated for x1 exercises vertically and horizontally verbalized understanding. Will attempt at later date. Thanks Delaney Meigs, PT [phone]

## 2012-01-07 NOTE — Clinical Documentation Improvement (Signed)
GENERIC DOCUMENTATION CLARIFICATION QUERY  THIS DOCUMENT IS NOT A PERMANENT PART OF THE MEDICAL RECORD  TO RESPOND TO THE THIS QUERY, FOLLOW THE INSTRUCTIONS BELOW:  1. If needed, update documentation for the patient's encounter via the notes activity.  2. Access this query again and click edit on the In [employer].  3. After updating, or not, click F2 to complete all highlighted (required) fields concerning your review. Select "additional documentation in the medical record" OR "no additional documentation provided".  4. Click Sign note button.  5. The deficiency will fall out of your In Basket *Please let us know if you are not able to complete this workflow by phone or e-mail (listed below).  Please update your documentation within the medical record to reflect your response to this query.                                                                                        01/07/12   Dear Dr.Thompson / Associates,  In a better effort to capture your patient's severity of illness, reflect appropriate length of stay and utilization of resources, a review of the patient medical record has revealed the following indicators.    Based on your clinical judgment, please clarify and document in a progress note and/or discharge summary the clinical condition associated with the following supporting information:  In responding to this query please exercise your independent judgment.  The fact that a query is asked, does not imply that any particular answer is desired or expected.  Possible Clinical Conditions?  _______Arrhythmias   _______Vasovagal Syncope  _______Orthostatic Hypotension  _______Seizures  _______Other Condition  _______Cannot Clinically Determine   Supporting Information:  Risk Factors: Per 3/15 progress notes, patient presents with syncope, which is preceded by dizziness and some associated palpitations, chest pain, and abdominal pain. Reports feeling confused  after syncopal episodes.  Signs & Symptoms:  Diagnostics: Phenytoin level:  3/17: 9.1 3/15: <2.5   You may use possible, probable, or suspect with inpatient documentation. possible, probable, suspected diagnoses MUST be documented at the time of discharge  Reviewed: Documentation provided per d/c summary.          Thank You,  Marciano Sequin,  Clinical Documentation Specialist:  Pager: [phone]  Health Information Management [place]

## 2012-01-08 NOTE — Procedures (Signed)
EEG NUMBER:  [id number].  HISTORY:  This is a 56 years old female referred for rule out seizures.  MEDICATIONS:  Dilantin.  CONDITION OF RECORDING:  This 16-lead EEG was recorded with the patient in awake and drowsy states.  Background rhythms; background patterns in wakefulness were well organized with a well-sustained posterior dominant rhythm of 9.5 Hz, symmetrical and reactive to eye opening and closing. Drowsiness was associated with mild attenuation of voltage and slowing frequencies.  Abnormal potentials: no epileptiform activity or focal slowing was noted.  ACTIVATION PROCEDURES:  Hyperventilation was not performed.  Photic stimulation was not performed.  EKG:  Single-channel EKG monitoring was unremarkable.  IMPRESSION:  This was a normal awake and drowsy EEG.  A normal EEG does not rule out the clinical diagnosis of epilepsy.  If clinically warranted, a repeat extended EEG or ambulatory recording may be obtained for prolonged recording times and sleep capture, which may increase the diagnostic yield.  Clinical correlation is suggested.          ______________________________ Carmell Austria, MD    WG:NFAO D:  01/07/2012 16:24:49  T:  01/07/2012 22:15:58  Job #:  [id number]

## 2012-01-14 ENCOUNTER — Emergency Department (HOSPITAL_COMMUNITY)
Admission: EM | Admit: 2012-01-14 | Discharge: 2012-01-14 | Disposition: A | Discharge status: Home / Self Care | Payer: Medicare Other | Attending: Emergency Medicine | Admitting: Emergency Medicine

## 2012-01-14 ENCOUNTER — Encounter (HOSPITAL_COMMUNITY): Payer: Self-pay | Admitting: *Deleted

## 2012-01-14 DIAGNOSIS — I809 Phlebitis and thrombophlebitis of unspecified site: Secondary | ICD-10-CM | POA: Insufficient documentation

## 2012-01-14 DIAGNOSIS — I808 Phlebitis and thrombophlebitis of other sites: Secondary | ICD-10-CM | POA: Diagnosis not present

## 2012-01-14 DIAGNOSIS — I10 Essential (primary) hypertension: Secondary | ICD-10-CM | POA: Diagnosis not present

## 2012-01-14 DIAGNOSIS — F329 Major depressive disorder, single episode, unspecified: Secondary | ICD-10-CM | POA: Diagnosis not present

## 2012-01-14 DIAGNOSIS — G8929 Other chronic pain: Secondary | ICD-10-CM | POA: Diagnosis not present

## 2012-01-14 DIAGNOSIS — Z79899 Other long term (current) drug therapy: Secondary | ICD-10-CM | POA: Insufficient documentation

## 2012-01-14 DIAGNOSIS — E785 Hyperlipidemia, unspecified: Secondary | ICD-10-CM | POA: Diagnosis not present

## 2012-01-14 DIAGNOSIS — R197 Diarrhea, unspecified: Secondary | ICD-10-CM | POA: Diagnosis not present

## 2012-01-14 DIAGNOSIS — R110 Nausea: Secondary | ICD-10-CM | POA: Insufficient documentation

## 2012-01-14 DIAGNOSIS — M7989 Other specified soft tissue disorders: Secondary | ICD-10-CM | POA: Insufficient documentation

## 2012-01-14 DIAGNOSIS — F3289 Other specified depressive episodes: Secondary | ICD-10-CM | POA: Insufficient documentation

## 2012-01-14 DIAGNOSIS — R509 Fever, unspecified: Secondary | ICD-10-CM | POA: Insufficient documentation

## 2012-01-14 MED ORDER — FENTANYL CITRATE 0.05 MG/ML IJ SOLN: 100.0000 ug | Freq: Once | INTRAMUSCULAR | Status: DC

## 2012-01-14 MED ORDER — FENTANYL CITRATE 0.05 MG/ML IJ SOLN
50.0000 ug | Freq: Once | INTRAMUSCULAR | Status: AC
  Administered 2012-01-14: 50 ug via INTRAVENOUS

## 2012-01-14 MED ORDER — ONDANSETRON 4 MG PO TBDP: 8.0000 mg | ORAL_TABLET | Freq: Once | ORAL | Status: DC

## 2012-01-14 MED ORDER — ACETAMINOPHEN-CODEINE #3 300-30 MG PO TABS: 1.0000 | ORAL_TABLET | Freq: Four times a day (QID) | ORAL | Status: AC | PRN

## 2012-01-14 MED ORDER — ONDANSETRON HCL 4 MG/2ML IJ SOLN
4.0000 mg | Freq: Once | INTRAMUSCULAR | Status: DC
  Filled 2012-01-14: qty 2

## 2012-01-14 MED ORDER — FENTANYL CITRATE 0.05 MG/ML IJ SOLN
50.0000 ug | Freq: Once | INTRAMUSCULAR | Status: DC
  Filled 2012-01-14: qty 2

## 2012-01-14 MED ORDER — ONDANSETRON HCL 4 MG/2ML IJ SOLN
4.0000 mg | Freq: Once | INTRAMUSCULAR | Status: AC
  Administered 2012-01-14: 4 mg via INTRAVENOUS

## 2012-01-14 NOTE — Progress Notes (Signed)
*  PRELIMINARY RESULTS* Vascular Ultrasound Left upper extremity venous duplex has been completed.  Preliminary findings: Left= No evidence of deep or superficial thrombus.  Farrel Demark RDMS 01/14/2012, 1:29 PM

## 2012-01-14 NOTE — Discharge Instructions (Signed)
Please read and follow all provided instructions.  Your diagnoses today include:  1. Thrombophlebitis/phlebitis     Tests performed today include:  Ultrasound of your arm that did not show any deep blood clots  Vital signs. See below for your results today.   Medications prescribed:   Tylenol #3 - narcotic pain medication  You have been prescribed narcotic pain medication such as Vicodin or Percocet: DO NOT drive or perform any activities that require you to be awake and alert because this medicine can make you drowsy. BE VERY CAREFUL not to take multiple medicines containing Tylenol (also called acetaminophen). Doing so can lead to an overdose which can damage your liver and cause liver failure and possibly death.    Ibuprofen - anti-inflammatory pain medication  Do not exceed 800mg  ibuprofen every 8 hours  You have been prescribed an anti-inflammatory medication or NSAID. Take with food. Take smallest effective dose for the shortest duration needed for your pain. Stop taking if you experience stomach pain or vomiting.   Take any prescribed medications only as directed.  Home care instructions:  Follow any educational materials contained in this packet.  BE VERY CAREFUL not to take multiple medicines containing Tylenol (also called acetaminophen). Doing so can lead to an overdose which can damage your liver and cause liver failure and possibly death.   Follow-up instructions: Please follow-up with your primary care provider in the next 3 days for further evaluation of your symptoms. If you do not have a primary care doctor -- see below for referral information.   Return instructions:   Please return to the Emergency Department if you experience worsening symptoms.   Please return if you have any other emergent concerns.  Additional Information:  Your vital signs today were: BP 117/74  Pulse 83  Temp(Src) 98.2 F (36.8 C) (Oral)  Resp 16  Ht 5\' 5"  (1.651 m)  Wt 235  lb (106.595 kg)  BMI 39.11 kg/m2  SpO2 95% If your blood pressure (BP) was elevated above 135/85 this visit, please have this repeated by your doctor within one month. -------------- No Primary Care Doctor Call [facility]  [phone] Other agencies that provide inexpensive medical care    [facility]  [phone]    [facility]  [phone]    [facility]  [phone]    [facility]  [phone]    [facility]  [phone]    [facility]  [phone] -------------- RESOURCE GUIDE:  Dental Problems  Patients with Medicaid: [facility] [phone] [address]                                            [phone] [address] Phone:  [phone]                                                   Phone:  [phone]  If unable to pay or uninsured, contact:  [facility] or [facility]. to become qualified for the adult dental clinic.  Chronic Pain Problems Contact [facility]  [phone] Patients need to be referred by their primary care doctor.  Insufficient Money for Medicine Contact [employer]:  call "211" or [facility] [phone].  Psychological Services [facility]  [phone] [facility]  [phone] [facility]   [phone] (emergency services [phone])  Substance Abuse [facility]  [phone] [facility] [phone] [facility] [phone] Floydene Flock [phone] Residential & Outpatient Substance Abuse Program  3196320197  Abuse/Neglect [facility] Child Abuse Hotline (484)214-5572 [facility] Child Abuse Hotline (936)579-3250 (After Hours)  Emergency Shelter [facility] [phone]  [facility] [place] [facility] [phone] [place] [facility] [phone]  [facility]  Resources  [facility] [place]     [employer]                          [facility] Dept. [address] [place]                       [address]      [address]  Blondell Reveal Phone:  [phone]                                   Phone:  [phone]                 Phone:  [phone]  [facility] Phone:  [phone]  [facility] Child Abuse Hotline [phone] (After Hours)

## 2012-01-14 NOTE — ED Notes (Signed)
Patient states that she was in hospital and had an IV placed in her left hand. Patient had pain to same and states that after 2 days of her IV it was d/c'd and puss came out of IV.  Pt states that she had cont. To have pain and swelling to her left hand.  Pt was sent to ED for further evaluation of blood flow to her hand.  Pt has full ROM.  Pt does have noted swelling to same.  Pt states that she had a fever on Sunday.  None since then.

## 2012-01-14 NOTE — ED Notes (Signed)
Patient transported to vascular lab at this time.

## 2012-01-14 NOTE — ED Provider Notes (Signed)
Medical screening examination/treatment/procedure(s) were performed by non-physician practitioner and as supervising physician I was immediately available for consultation/collaboration.   Dayton Bailiff, MD 01/14/12 [phone]

## 2012-01-14 NOTE — ED Notes (Signed)
Patient reports she had colitis on 03-15,  She was seen here in the ED and then admitted.  She states she was told that her left hand did not look good.  Patient has ongoing swelling and pain in her hand.  Patient was discharged home 03-19.  She has tried ice at home and exercises.  She states she can barely move her hand.  Patient states she does not feel better.  Her colitis is flaring up as well.  She states she is feeling worse

## 2012-01-14 NOTE — ED Notes (Signed)
Patient remains in vascular lab 

## 2012-01-14 NOTE — ED Provider Notes (Signed)
History     CSN: [id number]  Arrival date & time 01/14/12  1036   First MD Initiated Contact with Patient 01/14/12 1214      Chief Complaint  Patient presents with  . Arm Swelling  . Fever  . Diarrhea  . Nausea    (Consider location/radiation/quality/duration/timing/severity/associated sxs/prior treatment) HPI Comments: Patient presents several days after being admitted to the hospital for syncope, flare of ulcerative colitis -- presents with left dorsal hand pain since she was admitted to the hospital. The pain is at the site where a peripheral IV was started and in place for 2 days. Patient states that she still has swelling and discomfort in her hand and wrist despite using warm compresses at home. She is unable to use her hand for daily tasks including driving. She also states that pain goes up her arm into her upper arm. Patient states that her abdominal pain on the right side is worse today than has been previously. She denies fever, vomiting, diarrhea. No pain medications taken other than Tylenol. She was not discharged on any antibiotics.  Patient is a 56 y.o. female presenting with hand pain. The history is provided by the patient.  Hand Pain This is a new problem. The current episode started in the past 7 days. The problem occurs constantly. The problem has been unchanged. Associated symptoms include abdominal pain, arthralgias and myalgias. Pertinent negatives include no chest pain, chills, congestion, coughing, fever, headaches, nausea, sore throat or vomiting. The symptoms are aggravated by bending (Palpation). Treatments tried: Warm compress.    Past Medical History  Diagnosis Date  . Hypertension   . Ulcerative colitis   . Migraines   . Seizure disorder   . HTN (hypertension), benign 01/03/2012  . Hx of migraines 01/03/2012  . Seizure 01/03/2012  . Depression 01/03/2012  . Chronic back pain 01/03/2012  . 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 Hyperlipidemia 01/03/2012  .  Hepatitis C 01/03/2012  . Fibroids 01/03/2012    Past Surgical History  Procedure Date  . Cholecystectomy   . Abdominal hysterectomy   . Oophrectomy     No family history on file.  History  Substance Use Topics  . Smoking status: Never Smoker   . Smokeless tobacco: Not on file  . Alcohol Use: No     RARELY    OB History    Grav Para Term Preterm Abortions TAB SAB Ect Mult Living                  Review of Systems  Constitutional: Negative for fever and chills.  HENT: Negative for congestion, sore throat and rhinorrhea.   Eyes: Negative for redness.  Respiratory: Negative for cough.   Cardiovascular: Negative for chest pain.  Gastrointestinal: Positive for abdominal pain. Negative for nausea, vomiting and diarrhea.  Genitourinary: Negative for dysuria.  Musculoskeletal: Positive for myalgias and arthralgias.  Skin: Positive for color change and wound.  Neurological: Negative for headaches.    Allergies  Aspirin; Darvocet; Dilaudid; Midrin; Penicillins; and Wellbutrin  Home Medications   Current Outpatient Rx  Name Route Sig Dispense Refill  . ACETAMINOPHEN 500 MG PO TABS Oral Take 1,000 mg by mouth every 6 (six) hours as needed. For pain    . CYCLOBENZAPRINE HCL 10 MG PO TABS Oral Take 10 mg by mouth at bedtime.    . FLUOXETINE HCL 20 MG PO TABS Oral Take 20 mg by mouth 2 (two) times daily.     Marland Kitchen LORAZEPAM 0.5 MG  PO TABS Oral Take 0.5 mg by mouth every 8 (eight) hours as needed. For anxiety    . MESALAMINE 1.2 G PO TBEC Oral Take 4.8 g by mouth daily with breakfast.    . PHENYTOIN SODIUM EXTENDED 100 MG PO CAPS Oral Take 200 mg by mouth 2 (two) times daily.    . TRIAMTERENE-HCTZ 37.5-25 MG PO TABS Oral Take 1 tablet by mouth every morning.      BP 115/81  Pulse 104  Temp(Src) 98.3 F (36.8 C) (Oral)  Resp 20  Ht 5\' 5"  (1.651 m)  Wt 235 lb (106.595 kg)  BMI 39.11 kg/m2  SpO2 97%  Physical Exam  Nursing note and vitals reviewed. Constitutional: She is  oriented to person, place, and time. She appears well-developed and well-nourished.  HENT:  Head: Normocephalic and atraumatic.  Eyes: Conjunctivae are normal. Right eye exhibits no discharge. Left eye exhibits no discharge.  Neck: Normal range of motion. Neck supple.  Cardiovascular: Normal rate, regular rhythm and normal heart sounds.   Pulmonary/Chest: Effort normal and breath sounds normal.  Abdominal: Soft. There is no tenderness.  Musculoskeletal: Normal range of motion. She exhibits edema and tenderness.       Cords palpated over the dorsum of the left hand. They are mildly tender and consistent with thrombophlebitis. There is a small open wound at the site of the previous IV puncture. There is no drainage from this area. There is mild erythema and warmth but no definite cellulitis. There is no lymphangitis. No epitrochlear or axillary lymph nodes palpated. Full range of motion at the wrists bilaterally. 2+ radial pulses bilaterally.  Neurological: She is alert and oriented to person, place, and time.  Skin: Skin is warm and dry.       As noted in musculoskeletal exam  Psychiatric: She has a normal mood and affect.    ED Course  Procedures (including critical care time)  Labs Reviewed - No data to display No results found.   1. Thrombophlebitis/phlebitis     12:41 PM Patient seen and examined. Work-up initiated. Medications ordered.   Vital signs reviewed and are as follows: Filed Vitals:   01/14/12 1051  BP: 115/81  Pulse: 104  Temp: 98.3 F (36.8 C)  Resp: 20   Seen with Dr. Brooke Dare.   Suspect thrombophlebitis, but will [place] UE to ruleout DVT.  2:00 PM [place] was neg for DVT. Pt informed. She was treated with IV pain medicine. She requests Tylenol #3 for home. Urged to take antiinflammatories for thrombophebitis if she can tolerate.   Patient counseled on use of narcotic pain medications. Counseled not to combine these medications with others containing tylenol. Urged not  to drink alcohol, drive, or perform any other activities that requires focus while taking these medications. The patient verbalizes understanding and agrees with the plan.  Pt urged to return with worsening pain, worsening swelling, expanding area of redness or streaking up extremity, fever, or any other concerns. Pt verbalizes understanding and agrees with plan.  The patient was urged to return to the Emergency Department immediately with worsening of current symptoms, worsening abdominal pain, persistent vomiting, blood noted in stools, fever, or any other concerns. The patient verbalized understanding.   MDM  Thrombophlebitis 2/2 peripheral IV. No concern for cellulitis or lymphangitis. Abd pain is flare for her, no concerning symptoms, signs of dehydration. Pain medication prescribed. Return instructions given.         Renne Crigler, [place] 01/14/12 1549

## 2012-01-14 NOTE — ED Notes (Signed)
PA at bedside.

## 2012-03-25 DIAGNOSIS — I10 Essential (primary) hypertension: Secondary | ICD-10-CM | POA: Diagnosis not present

## 2012-03-25 DIAGNOSIS — G43909 Migraine, unspecified, not intractable, without status migrainosus: Secondary | ICD-10-CM | POA: Diagnosis not present

## 2012-03-25 DIAGNOSIS — E785 Hyperlipidemia, unspecified: Secondary | ICD-10-CM | POA: Diagnosis not present

## 2012-03-30 DIAGNOSIS — H04129 Dry eye syndrome of unspecified lacrimal gland: Secondary | ICD-10-CM | POA: Diagnosis not present

## 2012-04-26 ENCOUNTER — Other Ambulatory Visit: Payer: Self-pay

## 2012-04-26 ENCOUNTER — Encounter (HOSPITAL_COMMUNITY): Payer: Self-pay | Admitting: *Deleted

## 2012-04-26 ENCOUNTER — Emergency Department (HOSPITAL_COMMUNITY): Payer: Medicare Other

## 2012-04-26 ENCOUNTER — Emergency Department (HOSPITAL_COMMUNITY)
Admission: EM | Admit: 2012-04-26 | Discharge: 2012-04-26 | Disposition: A | Discharge status: Home / Self Care | Payer: Medicare Other | Attending: Emergency Medicine | Admitting: Emergency Medicine

## 2012-04-26 DIAGNOSIS — R10814 Left lower quadrant abdominal tenderness: Secondary | ICD-10-CM | POA: Diagnosis not present

## 2012-04-26 DIAGNOSIS — R002 Palpitations: Secondary | ICD-10-CM | POA: Insufficient documentation

## 2012-04-26 DIAGNOSIS — Z79899 Other long term (current) drug therapy: Secondary | ICD-10-CM | POA: Insufficient documentation

## 2012-04-26 DIAGNOSIS — R079 Chest pain, unspecified: Secondary | ICD-10-CM | POA: Insufficient documentation

## 2012-04-26 DIAGNOSIS — K5190 Ulcerative colitis, unspecified, without complications: Secondary | ICD-10-CM | POA: Diagnosis not present

## 2012-04-26 DIAGNOSIS — I10 Essential (primary) hypertension: Secondary | ICD-10-CM | POA: Insufficient documentation

## 2012-04-26 DIAGNOSIS — R0602 Shortness of breath: Secondary | ICD-10-CM | POA: Diagnosis not present

## 2012-04-26 LAB — URINALYSIS, ROUTINE W REFLEX MICROSCOPIC
Leukocytes, UA: NEGATIVE
Nitrite: NEGATIVE
Specific Gravity, Urine: 1.023 (ref 1.005–1.030)
pH: 6 (ref 5.0–8.0)

## 2012-04-26 LAB — COMPREHENSIVE METABOLIC PANEL
BUN: 14 mg/dL (ref 6–23)
Calcium: 10.2 mg/dL (ref 8.4–10.5)
Creatinine, Ser: 0.8 mg/dL (ref 0.50–1.10)
GFR calc Af Amer: >90 mL/min (ref >90)
Glucose, Bld: 99 mg/dL (ref 70–99)
Sodium: 138 mEq/L (ref 135–145)
Total Protein: 8.7 g/dL — ABNORMAL HIGH (ref 6.0–8.3)

## 2012-04-26 LAB — POCT I-STAT TROPONIN I

## 2012-04-26 LAB — CBC
HCT: 39.6 % (ref 36.0–46.0)
Hemoglobin: 13.1 g/dL (ref 12.0–15.0)
MCH: 25.6 pg — ABNORMAL LOW (ref 26.0–34.0)
MCHC: 33.1 g/dL (ref 30.0–36.0)

## 2012-04-26 MED ORDER — PROMETHAZINE HCL 25 MG PO TABS: 25.0000 mg | ORAL_TABLET | Freq: Four times a day (QID) | ORAL | Status: DC | PRN

## 2012-04-26 MED ORDER — MESALAMINE 4 G RE ENEM: 4.0000 g | ENEMA | Freq: Every day | RECTAL | Status: DC

## 2012-04-26 MED ORDER — SODIUM CHLORIDE 0.9 % IV BOLUS (SEPSIS)
1000.0000 mL | Freq: Once | INTRAVENOUS | Status: AC
  Administered 2012-04-26: 1000 mL via INTRAVENOUS

## 2012-04-26 MED ORDER — ONDANSETRON HCL 4 MG/2ML IJ SOLN
4.0000 mg | Freq: Once | INTRAMUSCULAR | Status: AC
  Administered 2012-04-26: 4 mg via INTRAVENOUS
  Filled 2012-04-26: qty 2

## 2012-04-26 MED ORDER — FENTANYL CITRATE 0.05 MG/ML IJ SOLN
50.0000 ug | Freq: Once | INTRAMUSCULAR | Status: AC
  Administered 2012-04-26: 19:00:00 via INTRAVENOUS
  Filled 2012-04-26: qty 2

## 2012-04-26 NOTE — ED Provider Notes (Signed)
History     CSN: [id number]  Arrival date & time 04/26/12  1354   First MD Initiated Contact with Patient 04/26/12 1644      Chief Complaint  Patient presents with  . Chest Pain  . Abdominal Pain    (Consider location/radiation/quality/duration/timing/severity/associated sxs/prior treatment) Patient is a 56 y.o. female presenting with abdominal pain. The history is provided by the patient and medical records.  Abdominal Pain The primary symptoms of the illness include abdominal pain, nausea, vomiting and diarrhea. The primary symptoms of the illness do not include fever, fatigue, shortness of breath or dysuria. The current episode started 2 days ago. The onset of the illness was gradual. The problem has been gradually worsening.  The abdominal pain is generalized (worse lower).  The vomiting began yesterday. Vomiting occurred once. The emesis contains stomach contents.   The diarrhea began 2 days ago. The diarrhea is mucous, semi-solid and watery. The diarrhea occurs 5 to 10 times per day.  The patient states that she believes she is currently not pregnant. The patient has had a change in bowel habit. Symptoms associated with the illness do not include chills, anorexia, urgency, frequency or back pain. Significant associated medical issues include inflammatory bowel disease (Ulcerative Colitis).    Past Medical History  Diagnosis Date  . Hypertension   . Ulcerative colitis   . Migraines   . Seizure disorder   . HTN (hypertension), benign 01/03/2012  . Hx of migraines 01/03/2012  . Seizure 01/03/2012  . Depression 01/03/2012  . Chronic back pain 01/03/2012  . 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 Hyperlipidemia 01/03/2012  . Hepatitis C 01/03/2012  . Fibroids 01/03/2012    Past Surgical History  Procedure Date  . Cholecystectomy   . Abdominal hysterectomy   . Oophrectomy     History reviewed. No pertinent family history.  History  Substance Use Topics  . Smoking status: Never  Smoker   . Smokeless tobacco: Not on file  . Alcohol Use: No     RARELY    OB History    Grav Para Term Preterm Abortions TAB SAB Ect Mult Living                  Review of Systems  Constitutional: Negative for fever, chills and fatigue.  Respiratory: Negative for cough and shortness of breath.   Cardiovascular: Positive for palpitations (with increasing stress). Negative for chest pain.  Gastrointestinal: Positive for nausea, vomiting, abdominal pain and diarrhea. Negative for blood in stool, abdominal distention and anorexia.  Genitourinary: Negative for dysuria, urgency and frequency.  Musculoskeletal: Negative for back pain.  Skin: Negative for color change and rash.  Neurological: Negative for light-headedness and headaches.  All other systems reviewed and are negative.    Allergies  Aspirin; Darvocet; Dilaudid; Isometheptene-apap-dichloral; Penicillins; and Wellbutrin  Home Medications   Current Outpatient Rx  Name Route Sig Dispense Refill  . ACETAMINOPHEN 500 MG PO TABS Oral Take 1,000 mg by mouth every 6 (six) hours as needed. For pain    . CYCLOBENZAPRINE HCL 10 MG PO TABS Oral Take 10 mg by mouth at bedtime.    . FLUOXETINE HCL 20 MG PO TABS Oral Take 20 mg by mouth 2 (two) times daily.     Marland Kitchen MESALAMINE 1.2 G PO TBEC Oral Take 4.8 g by mouth daily with breakfast.    . PHENYTOIN SODIUM EXTENDED 100 MG PO CAPS Oral Take 200 mg by mouth 2 (two) times daily.    Marland Kitchen  PROMETHAZINE HCL 25 MG PO TABS Oral Take 1 tablet (25 mg total) by mouth every 6 (six) hours as needed for nausea. 30 tablet 0  . TRIAMTERENE-HCTZ 37.5-25 MG PO TABS Oral Take 1 tablet by mouth every morning.      BP 132/86  Pulse 91  Temp 97.6 F (36.4 C) (Oral)  Resp 18  SpO2 99%  Physical Exam  Nursing note and vitals reviewed. Constitutional: She is oriented to person, place, and time. She appears well-developed and well-nourished.  HENT:  Head: Normocephalic and atraumatic.  Eyes: Pupils are  equal, round, and reactive to light.  Cardiovascular: Normal rate, regular rhythm, normal heart sounds and intact distal pulses.   Pulmonary/Chest: Effort normal and breath sounds normal. No respiratory distress.  Abdominal: Soft. Bowel sounds are normal. She exhibits no distension. There is tenderness (mild, diffuse; worse in LLQ). There is no rebound and no guarding.  Neurological: She is alert and oriented to person, place, and time.  Skin: Skin is warm and dry.  Psychiatric: She has a normal mood and affect.    ED Course  Procedures (including critical care time)  Labs Reviewed  CBC - Abnormal; Notable for the following:    RBC 5.12 (*)     MCV 77.3 (*)     MCH 25.6 (*)     All other components within normal limits  COMPREHENSIVE METABOLIC PANEL - Abnormal; Notable for the following:    Potassium 3.3 (*)     Total Protein 8.7 (*)     Total Bilirubin 0.2 (*)     GFR calc non Af Amer 81 (*)     All other components within normal limits  POCT I-STAT TROPONIN I  URINALYSIS, ROUTINE W REFLEX MICROSCOPIC   Dg Chest 2 View  04/26/2012  *RADIOLOGY REPORT*  Clinical Data: Chest pain and shortness of breath.  CHEST - 2 VIEW  Comparison: 01/03/2012.  Findings: The heart remains normal in size and the lungs are clear with normal vascularity.  Mild thoracic spine degenerative changes.  IMPRESSION: No acute abnormality.  Original Report Authenticated By: Darrol Angel, M.D.     1. Ulcerative colitis       MDM  This is a 49 smoking no history of ulcerative colitis who presents today with a flare. She states that for the past couple of days she has had increasing abdominal cramping, and yesterday started having nonbloody mucousy diarrhea, with increasing nausea and an episode of vomiting yesterday. She denies any fevers, state that she has intermittent feelings of fluttering in her chest, which she states she gets when she gets anxious such as when her UC flares up, denies any chest pain or  trouble breathing. She states that this is similar to her previous episodes of colitis flare, for which she has been treated with antibiotics and an unknown medication enema which she says helps treat the flare. Her last flare was several months ago, though she does state she has had mild abdominal cramping for about a month. Patient is well-appearing exam, with mild diffuse abdominal tenderness worse in the lower abdomen especially in the left lower quadrant. Feel that based on patient's description and exam findings, with unremarkable labs, that she requires symptom control at this point in time, and does not currently require imaging.  The patient's complete resolution of her symptoms at this point in time. She is able to tolerate oral intake without problems. Spoke with the on-call physician for [facility], who recommends restarting  the patient on her rowasa enemas and calling the clinic in the morning to schedule an appointment with Dr. Wendi Maya soon as possible. Discussed with patient continue treatment at home, follow up in chair, and indications for return. Patient expresses understanding with this plan.     Theotis Burrow, MD 04/26/12 2003

## 2012-04-26 NOTE — ED Provider Notes (Signed)
I saw and evaluated the patient, reviewed the resident's note and I agree with the findings and plan. Patient with a history of ulcerative colitis who is coming in today with a flare. She states she's been under a lot of stress was much likely caused the flare. Patient's labs are within normal limits but she was having lower abdominal pain. Patient's pain was controlled she was tolerating by mouth's and she went home with Rowasa and followup with GI tomorrow   Gwyneth Sprout, MD 04/26/12 [phone]

## 2012-04-26 NOTE — ED Notes (Signed)
Pt reports having abd cramping and n/v since 7/4 due to colitis, now having mid epigastric pain since last night. ekg being done at triage.

## 2012-05-27 DIAGNOSIS — Z130 Encounter for screening for diseases of the blood and blood-forming organs and certain disorders involving the immune mechanism: Secondary | ICD-10-CM | POA: Diagnosis not present

## 2012-05-27 DIAGNOSIS — Z01419 Encounter for gynecological examination (general) (routine) without abnormal findings: Secondary | ICD-10-CM | POA: Diagnosis not present

## 2012-05-27 DIAGNOSIS — Z1231 Encounter for screening mammogram for malignant neoplasm of breast: Secondary | ICD-10-CM | POA: Diagnosis not present

## 2012-05-27 DIAGNOSIS — Z124 Encounter for screening for malignant neoplasm of cervix: Secondary | ICD-10-CM | POA: Diagnosis not present

## 2012-06-15 DIAGNOSIS — H04129 Dry eye syndrome of unspecified lacrimal gland: Secondary | ICD-10-CM | POA: Diagnosis not present

## 2012-06-24 DIAGNOSIS — M549 Dorsalgia, unspecified: Secondary | ICD-10-CM | POA: Diagnosis not present

## 2012-07-19 ENCOUNTER — Other Ambulatory Visit: Payer: Self-pay

## 2012-07-19 ENCOUNTER — Encounter (HOSPITAL_COMMUNITY): Payer: Self-pay | Admitting: Emergency Medicine

## 2012-07-19 ENCOUNTER — Emergency Department (HOSPITAL_COMMUNITY)
Admission: EM | Admit: 2012-07-19 | Discharge: 2012-07-19 | Disposition: A | Discharge status: Home / Self Care | Payer: Medicare Other | Attending: Emergency Medicine | Admitting: Emergency Medicine

## 2012-07-19 DIAGNOSIS — K5190 Ulcerative colitis, unspecified, without complications: Secondary | ICD-10-CM | POA: Diagnosis not present

## 2012-07-19 DIAGNOSIS — F3289 Other specified depressive episodes: Secondary | ICD-10-CM | POA: Insufficient documentation

## 2012-07-19 DIAGNOSIS — Z9089 Acquired absence of other organs: Secondary | ICD-10-CM | POA: Diagnosis not present

## 2012-07-19 DIAGNOSIS — B1920 Unspecified viral hepatitis C without hepatic coma: Secondary | ICD-10-CM | POA: Diagnosis not present

## 2012-07-19 DIAGNOSIS — I10 Essential (primary) hypertension: Secondary | ICD-10-CM | POA: Diagnosis not present

## 2012-07-19 DIAGNOSIS — F329 Major depressive disorder, single episode, unspecified: Secondary | ICD-10-CM | POA: Diagnosis not present

## 2012-07-19 DIAGNOSIS — R112 Nausea with vomiting, unspecified: Secondary | ICD-10-CM | POA: Diagnosis not present

## 2012-07-19 DIAGNOSIS — G40909 Epilepsy, unspecified, not intractable, without status epilepticus: Secondary | ICD-10-CM | POA: Insufficient documentation

## 2012-07-19 DIAGNOSIS — R109 Unspecified abdominal pain: Secondary | ICD-10-CM | POA: Diagnosis not present

## 2012-07-19 DIAGNOSIS — Z90710 Acquired absence of both cervix and uterus: Secondary | ICD-10-CM | POA: Diagnosis not present

## 2012-07-19 DIAGNOSIS — R195 Other fecal abnormalities: Secondary | ICD-10-CM | POA: Diagnosis not present

## 2012-07-19 DIAGNOSIS — E785 Hyperlipidemia, unspecified: Secondary | ICD-10-CM | POA: Diagnosis not present

## 2012-07-19 LAB — CBC
MCV: 78.8 fL (ref 78.0–100.0)
Platelets: 288 10*3/uL (ref 150–400)
RDW: 16 % — ABNORMAL HIGH (ref 11.5–15.5)
WBC: 4.1 10*3/uL (ref 4.0–10.5)

## 2012-07-19 LAB — COMPREHENSIVE METABOLIC PANEL
AST: 22 U/L (ref 0–37)
Albumin: 3.5 g/dL (ref 3.5–5.2)
Chloride: 99 mEq/L (ref 96–112)
Creatinine, Ser: 0.65 mg/dL (ref 0.50–1.10)
Potassium: 3.2 mEq/L — ABNORMAL LOW (ref 3.5–5.1)
Total Bilirubin: 0.2 mg/dL — ABNORMAL LOW (ref 0.3–1.2)
Total Protein: 7.8 g/dL (ref 6.0–8.3)

## 2012-07-19 LAB — URINALYSIS, ROUTINE W REFLEX MICROSCOPIC
Glucose, UA: NEGATIVE mg/dL
Hgb urine dipstick: NEGATIVE
Protein, ur: NEGATIVE mg/dL
pH: 7.5 (ref 5.0–8.0)

## 2012-07-19 MED ORDER — OXYCODONE-ACETAMINOPHEN 5-325 MG PO TABS: 1.0000 | ORAL_TABLET | Freq: Four times a day (QID) | ORAL | Status: DC | PRN

## 2012-07-19 MED ORDER — SODIUM CHLORIDE 0.9 % IV BOLUS (SEPSIS)
1000.0000 mL | INTRAVENOUS | Status: AC
  Administered 2012-07-19: 1000 mL via INTRAVENOUS

## 2012-07-19 MED ORDER — MORPHINE SULFATE 4 MG/ML IJ SOLN
4.0000 mg | Freq: Once | INTRAMUSCULAR | Status: AC
  Administered 2012-07-19: 4 mg via INTRAVENOUS
  Filled 2012-07-19: qty 1

## 2012-07-19 MED ORDER — ONDANSETRON 4 MG PO TBDP: 4.0000 mg | ORAL_TABLET | Freq: Three times a day (TID) | ORAL | Status: DC | PRN

## 2012-07-19 MED ORDER — ONDANSETRON HCL 4 MG/2ML IJ SOLN
4.0000 mg | Freq: Once | INTRAMUSCULAR | Status: AC
  Administered 2012-07-19: 4 mg via INTRAVENOUS
  Filled 2012-07-19: qty 2

## 2012-07-19 MED ORDER — FENTANYL CITRATE 0.05 MG/ML IJ SOLN
75.0000 ug | Freq: Once | INTRAMUSCULAR | Status: AC
  Administered 2012-07-19: 75 ug via INTRAVENOUS
  Filled 2012-07-19: qty 2

## 2012-07-19 NOTE — ED Notes (Signed)
Pt. Stated, My colitis is acting up, it started on Friday.  All in my stomach.  I thought it would go away but its still there, i have an appt to see Dr. Albesa Seen

## 2012-07-19 NOTE — ED Notes (Signed)
Reports lower abd pain& nausea started Friday, diarrhea started Saturday. States same symptoms as her colitis acting up. Denies emesis, bloody stools. States stools initially star toff as mucousy then later gets bloody

## 2012-07-19 NOTE — ED Provider Notes (Signed)
History     CSN: [id number]  Arrival date & time 07/19/12  4098   First MD Initiated Contact with Patient 07/19/12 [phone]      Chief Complaint  Patient presents with  . Abdominal Pain    (Consider location/radiation/quality/duration/timing/severity/associated sxs/prior treatment) HPI Comments: Erin Wolf presents for evaluation of abdominal pain.  She states hse has had a steadily increasing lower abdominal cramping and discomfort.  Yesterday she had 3 mucus-like bowel movements.  She reports severe nausea also and exclaims, "My colitis is acting up!".  She has a hx of ulcerative colitis with the last similar exacerbation being in March of this year.  She also reports having a cholecystectomy, partial hysterectomy then a separate oophrectomy, but never a specific surgery secondary to ulcerative colitis.  She denies fever, rectal pain, melena, hematochezia, and hematemesis.  She took the rectal medication she had been previously prescribed for this condition but she has not experienced any relief.  Patient is a 56 y.o. female presenting with abdominal pain. The history is provided by the patient. No language interpreter was used.  Abdominal Pain The primary symptoms of the illness include abdominal pain, nausea and vomiting. The primary symptoms of the illness do not include fever, fatigue, shortness of breath, diarrhea, hematemesis, hematochezia, dysuria, vaginal discharge or vaginal bleeding. The current episode started 2 days ago. The onset of the illness was gradual. The problem has been gradually worsening.  The patient states that she believes she is currently not pregnant. The patient has had a change in bowel habit. Risk factors for an acute abdominal problem include a history of abdominal surgery. Additional symptoms associated with the illness include anorexia. Symptoms associated with the illness do not include chills, diaphoresis, heartburn, constipation, urgency, hematuria, frequency or  back pain. Significant associated medical issues include inflammatory bowel disease.    Past Medical History  Diagnosis Date  . Hypertension   . Ulcerative colitis   . Migraines   . Seizure disorder   . HTN (hypertension), benign 01/03/2012  . Hx of migraines 01/03/2012  . Seizure 01/03/2012  . Depression 01/03/2012  . Chronic back pain 01/03/2012  . 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 Hyperlipidemia 01/03/2012  . Hepatitis C 01/03/2012  . Fibroids 01/03/2012    Past Surgical History  Procedure Date  . Cholecystectomy   . Abdominal hysterectomy   . Oophrectomy     No family history on file.  History  Substance Use Topics  . Smoking status: Never Smoker   . Smokeless tobacco: Not on file  . Alcohol Use: No     RARELY    OB History    Grav Para Term Preterm Abortions TAB SAB Ect Mult Living                  Review of Systems  Constitutional: Negative for fever, chills, diaphoresis and fatigue.  Respiratory: Negative for shortness of breath.   Gastrointestinal: Positive for nausea, vomiting, abdominal pain and anorexia. Negative for heartburn, diarrhea, constipation, hematochezia and hematemesis.  Genitourinary: Negative for dysuria, urgency, frequency, hematuria, vaginal bleeding and vaginal discharge.  Musculoskeletal: Negative for back pain.  All other systems reviewed and are negative.    Allergies  Aspirin; Darvocet; Dilaudid; Isometheptene-apap-dichloral; Penicillins; and Wellbutrin  Home Medications   Current Outpatient Rx  Name Route Sig Dispense Refill  . FLUOXETINE HCL 20 MG PO TABS Oral Take 20 mg by mouth 2 (two) times daily.     . ADULT MULTIVITAMIN W/MINERALS CH Oral Take  2 tablets by mouth daily.    Marland Kitchen PHENYTOIN SODIUM EXTENDED 100 MG PO CAPS Oral Take 200 mg by mouth 2 (two) times daily.    Marland Kitchen PROMETHAZINE HCL 25 MG PO TABS Oral Take 25 mg by mouth every 6 (six) hours as needed.    . TRIAMTERENE-HCTZ 37.5-25 MG PO TABS Oral Take 1 tablet by mouth every  morning.      BP 133/77  Pulse 88  Temp 97.7 F (36.5 C) (Oral)  Resp 18  SpO2 96%  Physical Exam  Nursing note and vitals reviewed. Constitutional: She is oriented to person, place, and time. She appears well-developed and well-nourished. No distress.  HENT:  Head: Normocephalic and atraumatic.  Right Ear: External ear normal.  Left Ear: External ear normal.  Nose: Nose normal.  Mouth/Throat: Oropharynx is clear and moist. No oropharyngeal exudate.  Eyes: Conjunctivae normal are normal. Pupils are equal, round, and reactive to light. Right eye exhibits no discharge. Left eye exhibits no discharge. No scleral icterus.  Neck: Normal range of motion. Neck supple. No JVD present. No tracheal deviation present.  Cardiovascular: Normal rate, regular rhythm, normal heart sounds and intact distal pulses.  Exam reveals no gallop and no friction rub.   No murmur heard. Pulmonary/Chest: Effort normal and breath sounds normal. No stridor. No respiratory distress. She has no wheezes. She has no rales. She exhibits no tenderness.  Abdominal: Soft. Bowel sounds are normal. She exhibits no distension and no mass. There is no hepatosplenomegaly. There is generalized tenderness. There is no rigidity, no rebound, no guarding, no CVA tenderness, no tenderness at McBurney's point and negative Murphy's sign. No hernia.  Musculoskeletal: Normal range of motion. She exhibits no edema and no tenderness.  Lymphadenopathy:    She has no cervical adenopathy.  Neurological: She is alert and oriented to person, place, and time.  Skin: Skin is warm and dry. No rash noted. She is not diaphoretic. No erythema. No pallor.  Psychiatric: She has a normal mood and affect.    ED Course  Procedures (including critical care time)   Labs Reviewed  CBC  COMPREHENSIVE METABOLIC PANEL  URINALYSIS, ROUTINE W REFLEX MICROSCOPIC   No results found.   No diagnosis found.    MDM  Pt presents for evaluation of  abdominal pain.  She has a hx of ulcerative colitis and reports this pain feels consistent with previous exacerbations.  She denies fever but has experienced nausea, pain, and loose mucus-like stools.  She has stable VS, NAD.  Plan basic labs and symptomatic control.  Will reassess.  May obtain imaging if her exam evolves or she has significant abnormal lab findings.  She currently has no clinical evidence of peritonitis.  1425.  Pt is stable, NAD.  PT's pain is improved.  She will follow-up closely with Dr. Dulce Sellar.  Discussed indications for immediate return to the emergency department including severe pain, melena, severe rectal bleeding, or pain associated with fever.      Tobin Chad, MD 07/19/12 1432

## 2012-09-11 DIAGNOSIS — M545 Low back pain: Secondary | ICD-10-CM | POA: Diagnosis not present

## 2012-09-11 DIAGNOSIS — R42 Dizziness and giddiness: Secondary | ICD-10-CM | POA: Diagnosis not present

## 2012-09-11 DIAGNOSIS — R040 Epistaxis: Secondary | ICD-10-CM | POA: Diagnosis not present

## 2012-11-06 ENCOUNTER — Encounter (HOSPITAL_COMMUNITY): Payer: Self-pay | Admitting: *Deleted

## 2012-11-06 ENCOUNTER — Emergency Department (HOSPITAL_COMMUNITY)
Admission: EM | Admit: 2012-11-06 | Discharge: 2012-11-06 | Disposition: A | Discharge status: Home / Self Care | Payer: Medicare Other | Attending: Emergency Medicine | Admitting: Emergency Medicine

## 2012-11-06 DIAGNOSIS — Z79899 Other long term (current) drug therapy: Secondary | ICD-10-CM | POA: Insufficient documentation

## 2012-11-06 DIAGNOSIS — G40909 Epilepsy, unspecified, not intractable, without status epilepticus: Secondary | ICD-10-CM | POA: Diagnosis not present

## 2012-11-06 DIAGNOSIS — R112 Nausea with vomiting, unspecified: Secondary | ICD-10-CM | POA: Diagnosis not present

## 2012-11-06 DIAGNOSIS — Z8679 Personal history of other diseases of the circulatory system: Secondary | ICD-10-CM | POA: Diagnosis not present

## 2012-11-06 DIAGNOSIS — K529 Noninfective gastroenteritis and colitis, unspecified: Secondary | ICD-10-CM

## 2012-11-06 DIAGNOSIS — R197 Diarrhea, unspecified: Secondary | ICD-10-CM | POA: Insufficient documentation

## 2012-11-06 DIAGNOSIS — F3289 Other specified depressive episodes: Secondary | ICD-10-CM | POA: Insufficient documentation

## 2012-11-06 DIAGNOSIS — M549 Dorsalgia, unspecified: Secondary | ICD-10-CM | POA: Diagnosis not present

## 2012-11-06 DIAGNOSIS — I10 Essential (primary) hypertension: Secondary | ICD-10-CM | POA: Insufficient documentation

## 2012-11-06 DIAGNOSIS — Z8659 Personal history of other mental and behavioral disorders: Secondary | ICD-10-CM | POA: Diagnosis not present

## 2012-11-06 DIAGNOSIS — Z8719 Personal history of other diseases of the digestive system: Secondary | ICD-10-CM | POA: Insufficient documentation

## 2012-11-06 DIAGNOSIS — Z8619 Personal history of other infectious and parasitic diseases: Secondary | ICD-10-CM | POA: Insufficient documentation

## 2012-11-06 DIAGNOSIS — K5289 Other specified noninfective gastroenteritis and colitis: Secondary | ICD-10-CM | POA: Insufficient documentation

## 2012-11-06 DIAGNOSIS — F329 Major depressive disorder, single episode, unspecified: Secondary | ICD-10-CM | POA: Insufficient documentation

## 2012-11-06 DIAGNOSIS — G8929 Other chronic pain: Secondary | ICD-10-CM | POA: Diagnosis not present

## 2012-11-06 DIAGNOSIS — E785 Hyperlipidemia, unspecified: Secondary | ICD-10-CM | POA: Diagnosis not present

## 2012-11-06 LAB — URINALYSIS, ROUTINE W REFLEX MICROSCOPIC
Bilirubin Urine: NEGATIVE
Hgb urine dipstick: NEGATIVE
Specific Gravity, Urine: 1.027 (ref 1.005–1.030)
Urobilinogen, UA: 0.2 mg/dL (ref 0.0–1.0)
pH: 6 (ref 5.0–8.0)

## 2012-11-06 LAB — BASIC METABOLIC PANEL
BUN: 15 mg/dL (ref 6–23)
GFR calc non Af Amer: >90 mL/min (ref >90)
Glucose, Bld: 98 mg/dL (ref 70–99)
Potassium: 3.3 mEq/L — ABNORMAL LOW (ref 3.5–5.1)

## 2012-11-06 LAB — CBC
HCT: 39.6 % (ref 36.0–46.0)
Hemoglobin: 12.8 g/dL (ref 12.0–15.0)
MCHC: 32.3 g/dL (ref 30.0–36.0)

## 2012-11-06 MED ORDER — ONDANSETRON 8 MG PO TBDP: 8.0000 mg | ORAL_TABLET | Freq: Three times a day (TID) | ORAL | Status: DC | PRN

## 2012-11-06 MED ORDER — METRONIDAZOLE 500 MG PO TABS: 500.0000 mg | ORAL_TABLET | Freq: Two times a day (BID) | ORAL | Status: DC

## 2012-11-06 MED ORDER — SODIUM CHLORIDE 0.9 % IV SOLN
1000.0000 mL | Freq: Once | INTRAVENOUS | Status: AC
  Administered 2012-11-06: 1000 mL via INTRAVENOUS

## 2012-11-06 MED ORDER — ONDANSETRON HCL 4 MG/2ML IJ SOLN
4.0000 mg | Freq: Once | INTRAMUSCULAR | Status: AC
  Administered 2012-11-06: 4 mg via INTRAVENOUS
  Filled 2012-11-06: qty 2

## 2012-11-06 MED ORDER — SODIUM CHLORIDE 0.9 % IV SOLN: 1000.0000 mL | INTRAVENOUS | Status: DC

## 2012-11-06 MED ORDER — FENTANYL CITRATE 0.05 MG/ML IJ SOLN
100.0000 ug | INTRAMUSCULAR | Status: DC | PRN
  Administered 2012-11-06: 100 ug via INTRAVENOUS
  Filled 2012-11-06: qty 2

## 2012-11-06 NOTE — ED Provider Notes (Signed)
History     CSN: [id number]  Arrival date & time 11/06/12  7829   First MD Initiated Contact with Patient 11/06/12 [phone]      Chief Complaint  Patient presents with  . Multiple Complaints      HPI Patient reports several days of nausea vomiting diarrhea with chills no documented fever.  She denies melena or hematochezia.  No hematemesis.  She states a long-standing history of colitis and has not seen her gastroenterologist in several months.  She states decreased oral intake over the past 48 hours.  No chest pain shortness of breath.  No other complaints.  Mild generalized abdominal discomfort without focality. Past Medical History  Diagnosis Date  . Hypertension   . Ulcerative colitis   . Migraines   . Seizure disorder   . HTN (hypertension), benign 01/03/2012  . Hx of migraines 01/03/2012  . Seizure 01/03/2012  . Depression 01/03/2012  . Chronic back pain 01/03/2012  . 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 Hyperlipidemia 01/03/2012  . Hepatitis C 01/03/2012  . Fibroids 01/03/2012    Past Surgical History  Procedure Date  . Cholecystectomy   . Abdominal hysterectomy   . Oophrectomy     History reviewed. No pertinent family history.  History  Substance Use Topics  . Smoking status: Never Smoker   . Smokeless tobacco: Never Used  . Alcohol Use: No     Comment: RARELY    OB History    Grav Para Term Preterm Abortions TAB SAB Ect Mult Living                  Review of Systems  All other systems reviewed and are negative.    Allergies  Aspirin; Darvocet; Dilaudid; Isometheptene-apap-dichloral; Penicillins; and Wellbutrin  Home Medications   Current Outpatient Rx  Name  Route  Sig  Dispense  Refill  . ACETAMINOPHEN-CODEINE 300-30 MG PO TABS   Oral   Take 1 tablet by mouth every 6 (six) hours as needed. For pain         . CYCLOBENZAPRINE HCL 10 MG PO TABS   Oral   Take 10 mg by mouth at bedtime.         . FLUOXETINE HCL 20 MG PO TABS   Oral   Take 20 mg by  mouth 2 (two) times daily.          Marland Kitchen MESALAMINE 4 G RE ENEM   Rectal   Place 4 g rectally as needed. For constipation         . ONDANSETRON 4 MG PO TBDP   Oral   Take 1 tablet (4 mg total) by mouth every 8 (eight) hours as needed for nausea.   10 tablet   0   . TRIAMTERENE-HCTZ 37.5-25 MG PO TABS   Oral   Take 1 tablet by mouth every morning.           BP 136/88  Pulse 103  Temp 98.3 F (36.8 C) (Oral)  Resp 20  SpO2 98%  Physical Exam  Nursing note and vitals reviewed. Constitutional: She is oriented to person, place, and time. She appears well-developed and well-nourished. No distress.  HENT:  Head: Normocephalic and atraumatic.  Eyes: EOM are normal.  Neck: Normal range of motion.  Cardiovascular: Normal rate, regular rhythm and normal heart sounds.   Pulmonary/Chest: Effort normal and breath sounds normal.  Abdominal: Soft. She exhibits no distension. There is no tenderness.  Musculoskeletal: Normal range of motion.  Neurological:  She is alert and oriented to person, place, and time.  Skin: Skin is warm and dry.  Psychiatric: She has a normal mood and affect. Judgment normal.    ED Course  Procedures (including critical care time)  Labs Reviewed  CBC - Abnormal; Notable for the following:    MCH 25.3 (*)     RDW 15.9 (*)     All other components within normal limits  BASIC METABOLIC PANEL - Abnormal; Notable for the following:    Potassium 3.3 (*)     All other components within normal limits  URINALYSIS, ROUTINE W REFLEX MICROSCOPIC - Abnormal; Notable for the following:    APPearance CLOUDY (*)     All other components within normal limits   No results found.   1. Nausea vomiting and diarrhea   2. Colitis       MDM  Patient has nausea vomiting diarrhea.  She feels better after IV fluids.  Antinausea medicine given.  Patient be sent home with antinausea medicine.  She also states that he felt like this before and usually represents colitis  and she improved with antibiotics.  She would discharge home with 7 days of Flagyl.  PCP followup.  No indication for imaging today on examination.  Discharge home in good condition.  She understands to return to the ER for new worsening symptoms        Lyanne Co, MD 11/06/12 [phone]

## 2012-11-06 NOTE — ED Notes (Signed)
Pt with dry heaves after medications, supplies given

## 2012-11-06 NOTE — ED Notes (Signed)
Pt states she did not take any of her prescribed pain medication or her nausea medication.

## 2012-11-06 NOTE — ED Notes (Addendum)
Pt c/o of abdominal cramping, nausea, dizziness, fever, chills, loss of appetite, and sore on buttocks/irritation. Pt reports diarrhea yesterday. Pt reports symptoms since Monday. Pt is A&Ox4, skin warm and dry, respirations equal and unlabored.

## 2012-12-06 ENCOUNTER — Emergency Department (HOSPITAL_COMMUNITY)
Admission: EM | Admit: 2012-12-06 | Discharge: 2012-12-06 | Disposition: A | Discharge status: Home / Self Care | Payer: No Typology Code available for payment source | Attending: Emergency Medicine | Admitting: Emergency Medicine

## 2012-12-06 ENCOUNTER — Encounter (HOSPITAL_COMMUNITY): Payer: Self-pay

## 2012-12-06 ENCOUNTER — Emergency Department (HOSPITAL_COMMUNITY): Payer: No Typology Code available for payment source

## 2012-12-06 DIAGNOSIS — Z8742 Personal history of other diseases of the female genital tract: Secondary | ICD-10-CM | POA: Insufficient documentation

## 2012-12-06 DIAGNOSIS — I10 Essential (primary) hypertension: Secondary | ICD-10-CM | POA: Insufficient documentation

## 2012-12-06 DIAGNOSIS — R269 Unspecified abnormalities of gait and mobility: Secondary | ICD-10-CM | POA: Diagnosis not present

## 2012-12-06 DIAGNOSIS — W010XXA Fall on same level from slipping, tripping and stumbling without subsequent striking against object, initial encounter: Secondary | ICD-10-CM | POA: Insufficient documentation

## 2012-12-06 DIAGNOSIS — S8990XA Unspecified injury of unspecified lower leg, initial encounter: Secondary | ICD-10-CM | POA: Diagnosis present

## 2012-12-06 DIAGNOSIS — M549 Dorsalgia, unspecified: Secondary | ICD-10-CM | POA: Diagnosis not present

## 2012-12-06 DIAGNOSIS — G8929 Other chronic pain: Secondary | ICD-10-CM | POA: Diagnosis not present

## 2012-12-06 DIAGNOSIS — K5190 Ulcerative colitis, unspecified, without complications: Secondary | ICD-10-CM | POA: Insufficient documentation

## 2012-12-06 DIAGNOSIS — IMO0002 Reserved for concepts with insufficient information to code with codable children: Secondary | ICD-10-CM | POA: Diagnosis not present

## 2012-12-06 DIAGNOSIS — Z8619 Personal history of other infectious and parasitic diseases: Secondary | ICD-10-CM | POA: Diagnosis not present

## 2012-12-06 DIAGNOSIS — Z791 Long term (current) use of non-steroidal anti-inflammatories (NSAID): Secondary | ICD-10-CM | POA: Insufficient documentation

## 2012-12-06 DIAGNOSIS — Z79899 Other long term (current) drug therapy: Secondary | ICD-10-CM | POA: Insufficient documentation

## 2012-12-06 DIAGNOSIS — R110 Nausea: Secondary | ICD-10-CM | POA: Insufficient documentation

## 2012-12-06 DIAGNOSIS — Z8669 Personal history of other diseases of the nervous system and sense organs: Secondary | ICD-10-CM | POA: Diagnosis not present

## 2012-12-06 DIAGNOSIS — Y9389 Activity, other specified: Secondary | ICD-10-CM | POA: Insufficient documentation

## 2012-12-06 DIAGNOSIS — Z8679 Personal history of other diseases of the circulatory system: Secondary | ICD-10-CM | POA: Insufficient documentation

## 2012-12-06 DIAGNOSIS — S93409A Sprain of unspecified ligament of unspecified ankle, initial encounter: Secondary | ICD-10-CM | POA: Insufficient documentation

## 2012-12-06 DIAGNOSIS — Z8659 Personal history of other mental and behavioral disorders: Secondary | ICD-10-CM | POA: Insufficient documentation

## 2012-12-06 DIAGNOSIS — F329 Major depressive disorder, single episode, unspecified: Secondary | ICD-10-CM | POA: Insufficient documentation

## 2012-12-06 DIAGNOSIS — F3289 Other specified depressive episodes: Secondary | ICD-10-CM | POA: Insufficient documentation

## 2012-12-06 DIAGNOSIS — S99919A Unspecified injury of unspecified ankle, initial encounter: Secondary | ICD-10-CM

## 2012-12-06 DIAGNOSIS — Y9229 Other specified public building as the place of occurrence of the external cause: Secondary | ICD-10-CM | POA: Insufficient documentation

## 2012-12-06 DIAGNOSIS — M79609 Pain in unspecified limb: Secondary | ICD-10-CM | POA: Diagnosis not present

## 2012-12-06 MED ORDER — ONDANSETRON 8 MG PO TBDP
8.0000 mg | ORAL_TABLET | Freq: Once | ORAL | Status: AC
  Administered 2012-12-06: 8 mg via ORAL
  Filled 2012-12-06: qty 1

## 2012-12-06 MED ORDER — FENTANYL CITRATE 0.05 MG/ML IJ SOLN
50.0000 ug | Freq: Once | INTRAMUSCULAR | Status: AC
  Administered 2012-12-06: 50 ug via INTRAMUSCULAR
  Filled 2012-12-06: qty 2

## 2012-12-06 MED ORDER — NAPROXEN 500 MG PO TABS: 500.0000 mg | ORAL_TABLET | Freq: Two times a day (BID) | ORAL | Status: DC

## 2012-12-06 MED ORDER — HYDROCODONE-ACETAMINOPHEN 5-325 MG PO TABS: 2.0000 | ORAL_TABLET | ORAL | Status: DC | PRN

## 2012-12-06 NOTE — ED Provider Notes (Signed)
Medical screening examination/treatment/procedure(s) were performed by non-physician practitioner and as supervising physician I was immediately available for consultation/collaboration.   Chizuko Trine B. Bernette Mayers, MD 12/06/12 [phone]

## 2012-12-06 NOTE — ED Notes (Signed)
Pt twisted her ankle and injured her left ankle after stepping into a uncover drain hole in a store. Limited ROM, swelling noted at left ankle.

## 2012-12-06 NOTE — ED Provider Notes (Signed)
History    This chart was scribed for non-physician practitioner working with Erin Most B. Bernette Mayers, MD by Erin Wolf, ED Scribe. This patient was seen in room WTR8/WTR8 and the patient's care was started at 1538.   CSN: [id number]  Arrival date & time 12/06/12  1438   First MD Initiated Contact with Patient 12/06/12 1538      Chief Complaint  Patient presents with  . Ankle Injury     The history is provided by the patient and medical records. No language interpreter was used.   Erin Wolf is a 57 y.o. female who presents to the Emergency Department complaining of a sudden onset, non changing, constant, severe ankle pain due to a stumble which occurred earlier today when she stepped into an uncovered drain hole in a store. She did not fall, lose consciousness, hit her head or twist her neck or back.  She describes the pain as throbbing and reports it radiates to her calf. She was ambulatory immediately after the fall, but reports that after that the pain because worse with bearing weight on her left foot. She reports associated nausea, abdominal pain which is chronic for her. She denies any other pain. She has a h/o ulcerative colitis, migraines, depression, HTN and a herniated disc in her back causing chronic back pain.  Past Medical History  Diagnosis Date  . Hypertension   . Ulcerative colitis   . Migraines   . Seizure disorder   . HTN (hypertension), benign 01/03/2012  . Hx of migraines 01/03/2012  . Seizure 01/03/2012  . Depression 01/03/2012  . Chronic back pain 01/03/2012  . 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 Hyperlipidemia 01/03/2012  . Hepatitis C 01/03/2012  . Fibroids 01/03/2012    Past Surgical History  Procedure Laterality Date  . Cholecystectomy    . Abdominal hysterectomy    . Oophrectomy      No family history on file.  History  Substance Use Topics  . Smoking status: Never Smoker   . Smokeless tobacco: Never Used  . Alcohol Use: No     Comment: RARELY      Review of Systems  Constitutional: Negative for fever, diaphoresis, appetite change, fatigue and unexpected weight change.  HENT: Negative for mouth sores and neck stiffness.   Eyes: Negative for visual disturbance.  Respiratory: Negative for cough, chest tightness, shortness of breath and wheezing.   Cardiovascular: Negative for chest pain.  Gastrointestinal: Positive for nausea. Negative for vomiting, abdominal pain, diarrhea and constipation.  Endocrine: Negative for polydipsia, polyphagia and polyuria.  Genitourinary: Negative for dysuria, urgency, frequency and hematuria.  Musculoskeletal: Positive for joint swelling, arthralgias and gait problem (2/2 pain). Negative for back pain.  Skin: Negative for rash.  Allergic/Immunologic: Negative for immunocompromised state.  Neurological: Negative for syncope, light-headedness and headaches.  Hematological: Does not bruise/bleed easily.  Psychiatric/Behavioral: Negative for sleep disturbance. The patient is not nervous/anxious.   All other systems reviewed and are negative.    Allergies  Aspirin; Darvocet; Dilaudid; Isometheptene-apap-dichloral; Penicillins; and Wellbutrin  Home Medications   Current Outpatient Rx  Name  Route  Sig  Dispense  Refill  . FLUoxetine (PROZAC) 20 MG tablet   Oral   Take 20 mg by mouth every morning.          . mesalamine (ROWASA) 4 G enema   Rectal   Place 4 g rectally as needed. For constipation         . triamterene-hydrochlorothiazide (MAXZIDE-25) 37.5-25 MG per tablet  Oral   Take 1 tablet by mouth every morning.         Marland Kitchen HYDROcodone-acetaminophen (NORCO/VICODIN) 5-325 MG per tablet   Oral   Take 2 tablets by mouth every 4 (four) hours as needed for pain.   10 tablet   0   . naproxen (NAPROSYN) 500 MG tablet   Oral   Take 1 tablet (500 mg total) by mouth 2 (two) times daily with a meal.   30 tablet   0     Triage Vitals; BP 129/83  Pulse 116  Temp(Src) 97.9 F (36.6  C) (Oral)  Resp 18  SpO2 98%  Physical Exam  Nursing note and vitals reviewed. Constitutional: She is oriented to person, place, and time. She appears well-developed and well-nourished. No distress.  HENT:  Head: Normocephalic and atraumatic.  Mouth/Throat: Oropharynx is clear and moist.  Eyes: Conjunctivae and EOM are normal. Pupils are equal, round, and reactive to light. No scleral icterus.  Neck: Normal range of motion and full passive range of motion without pain. Neck supple. No spinous process tenderness and no muscular tenderness present. No rigidity. Normal range of motion present.  Cardiovascular: Regular rhythm, S1 normal, S2 normal and intact distal pulses.  Tachycardia present.   Pulses:      Radial pulses are 2+ on the right side, and 2+ on the left side.       Dorsalis pedis pulses are 2+ on the right side, and 2+ on the left side.       Posterior tibial pulses are 2+ on the right side, and 2+ on the left side.  capillary refill < 3 sec  Pulmonary/Chest: Effort normal and breath sounds normal. No respiratory distress. She has no decreased breath sounds. She has no wheezes. She has no rhonchi. She has no rales.  Abdominal: Soft. Bowel sounds are normal. There is no tenderness. There is no rigidity, no rebound and no guarding.  Musculoskeletal: She exhibits edema and tenderness.       Left ankle: She exhibits decreased range of motion (secondary to pain), swelling (left forefoot and ankle) and ecchymosis. She exhibits no deformity, no laceration and normal pulse. Tenderness. Lateral malleolus tenderness found. Achilles tendon normal.       Left foot: She exhibits tenderness and swelling. She exhibits normal capillary refill, no crepitus, no deformity and no laceration.  Decreased ROM of the L ankle 2/2 pain  Lymphadenopathy:    She has no cervical adenopathy.  Neurological: She is alert and oriented to person, place, and time. She has normal reflexes. No cranial nerve deficit.  She exhibits normal muscle tone. Coordination normal.  Sensation is in tact. Decreased strength secondary to pain.  Skin: Skin is warm and dry. No rash noted. She is not diaphoretic.  Psychiatric: She has a normal mood and affect. Her behavior is normal. Judgment and thought content normal.    ED Course  Procedures (including critical care time) DIAGNOSTIC STUDIES: Oxygen Saturation is 98% on room air, normal by my interpretation.    COORDINATION OF CARE: 3:54 PM Discussed treatment plan which includes medication, ankle splint, crutches and referral to orthopedist with pt at bedside and pt agreed to plan.     Labs Reviewed - No data to display Dg Foot Complete Left  12/06/2012  *RADIOLOGY REPORT*  Clinical Data: Ankle injury, twisting injury.  Lateral foot and ankle pain.  LEFT FOOT - COMPLETE 3+ VIEW  Comparison: None.  Findings: Spurring noted along the talus medially  and anteriorly. No acute bony abnormality.  No fracture, subluxation or dislocation.  Soft tissues are intact.  IMPRESSION: No acute bony abnormality.   Original Report Authenticated By: Charlett Nose, M.D.      1. Ankle injury   2. Ankle sprain and strain, left, initial encounter       MDM  Tia Masker presents with ankle pain.  X-ray with No acute bony abnormality. No fracture, subluxation or Dislocation.  Patient with significant swelling and ecchymosis of the left ankle. Will place in an ASO and on crutches. Discussed rice protocol and use of her ASO would often walk. Also discussed the patient should followup with her orthopedist Dr. August Saucer if symptoms persist greater than one week.  I have also discussed reasons to return immediately to the ER.  Patient expresses understanding and agrees with plan.  1. Medications: Vicodin, Naprosyn, usual home medications 2. Treatment: rest, drink plenty of fluids, ice, elevation, compression, wear brace when up, use crutches as needed 3. Follow Up: Please followup with your  primary doctor for discussion of your diagnoses and further evaluation after today's visit; if you do not have a primary care doctor use the resource guide provided to find one; [facility] if symptoms persist greater than one week   I personally performed the services described in this documentation, which was scribed in my presence. The recorded information has been reviewed and is accurate.         Dahlia Client Lidie Glade, PA-C 12/06/12 1631

## 2012-12-23 DIAGNOSIS — K5289 Other specified noninfective gastroenteritis and colitis: Secondary | ICD-10-CM | POA: Diagnosis not present

## 2012-12-23 DIAGNOSIS — S93409A Sprain of unspecified ligament of unspecified ankle, initial encounter: Secondary | ICD-10-CM | POA: Diagnosis not present

## 2012-12-23 DIAGNOSIS — M545 Low back pain: Secondary | ICD-10-CM | POA: Diagnosis not present

## 2013-01-24 ENCOUNTER — Emergency Department (HOSPITAL_COMMUNITY)
Admission: EM | Admit: 2013-01-24 | Discharge: 2013-01-24 | Disposition: A | Discharge status: Home / Self Care | Payer: No Typology Code available for payment source | Attending: Emergency Medicine | Admitting: Emergency Medicine

## 2013-01-24 ENCOUNTER — Encounter (HOSPITAL_COMMUNITY): Payer: Self-pay | Admitting: Emergency Medicine

## 2013-01-24 DIAGNOSIS — M25579 Pain in unspecified ankle and joints of unspecified foot: Secondary | ICD-10-CM | POA: Diagnosis present

## 2013-01-24 DIAGNOSIS — G40909 Epilepsy, unspecified, not intractable, without status epilepticus: Secondary | ICD-10-CM | POA: Diagnosis not present

## 2013-01-24 DIAGNOSIS — B1920 Unspecified viral hepatitis C without hepatic coma: Secondary | ICD-10-CM | POA: Diagnosis not present

## 2013-01-24 DIAGNOSIS — E785 Hyperlipidemia, unspecified: Secondary | ICD-10-CM | POA: Insufficient documentation

## 2013-01-24 DIAGNOSIS — I10 Essential (primary) hypertension: Secondary | ICD-10-CM | POA: Diagnosis not present

## 2013-01-24 DIAGNOSIS — Z8679 Personal history of other diseases of the circulatory system: Secondary | ICD-10-CM | POA: Insufficient documentation

## 2013-01-24 DIAGNOSIS — M549 Dorsalgia, unspecified: Secondary | ICD-10-CM | POA: Insufficient documentation

## 2013-01-24 DIAGNOSIS — K5190 Ulcerative colitis, unspecified, without complications: Secondary | ICD-10-CM | POA: Insufficient documentation

## 2013-01-24 DIAGNOSIS — G8929 Other chronic pain: Secondary | ICD-10-CM | POA: Insufficient documentation

## 2013-01-24 DIAGNOSIS — F341 Dysthymic disorder: Secondary | ICD-10-CM | POA: Insufficient documentation

## 2013-01-24 DIAGNOSIS — M25572 Pain in left ankle and joints of left foot: Secondary | ICD-10-CM

## 2013-01-24 MED ORDER — PROMETHAZINE HCL 25 MG PO TABS: 25.0000 mg | ORAL_TABLET | Freq: Four times a day (QID) | ORAL | Status: DC | PRN

## 2013-01-24 MED ORDER — FENTANYL CITRATE 0.05 MG/ML IJ SOLN
50.0000 ug | Freq: Once | INTRAMUSCULAR | Status: AC
  Administered 2013-01-24: 50 ug via INTRAMUSCULAR
  Filled 2013-01-24: qty 2

## 2013-01-24 MED ORDER — PROMETHAZINE HCL 25 MG/ML IJ SOLN
25.0000 mg | Freq: Once | INTRAMUSCULAR | Status: AC
  Administered 2013-01-24: 25 mg via INTRAMUSCULAR
  Filled 2013-01-24: qty 1

## 2013-01-24 NOTE — ED Provider Notes (Signed)
Medical screening examination/treatment/procedure(s) were performed by non-physician practitioner and as supervising physician I was immediately available for consultation/collaboration.  Doug Sou, MD 01/24/13 [phone]

## 2013-01-24 NOTE — ED Notes (Signed)
PT states that she twisted her ankle in February and tried to wear high heels yesterday at a wedding.  C/o lt ankle pain since.

## 2013-01-24 NOTE — ED Provider Notes (Signed)
History     CSN: [id number]  Arrival date & time 01/24/13  0935   First MD Initiated Contact with Patient 01/24/13 [phone]      Chief Complaint  Patient presents with  . Ankle Pain    (Consider location/radiation/quality/duration/timing/severity/associated sxs/prior treatment) HPI Comments: Patient presents to the emergency department with chief complaint of left ankle pain. She states that she twisted her ankle back in February. She has been seen by orthopedics and by her primary care Dr., and has been healing well. She states that yesterday she was wearing high heels for the first time at a wedding, and thinks that she reaggravated her ankle. She states that her ankle is moderately painful, but she thinks that she will be okay if she can keep down her pain medicine. She states that she has had some vomiting lately, and asks for some Phenergan to help keep down her pain medicine. She plans to followup with her orthopedic surgeon. The pain does not radiate. It is worsened with walking.  The history is provided by the patient. No language interpreter was used.    Past Medical History  Diagnosis Date  . Hypertension   . Ulcerative colitis   . Migraines   . Seizure disorder   . HTN (hypertension), benign 01/03/2012  . Hx of migraines 01/03/2012  . Seizure 01/03/2012  . Depression 01/03/2012  . Chronic back pain 01/03/2012  . 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 Hyperlipidemia 01/03/2012  . Hepatitis C 01/03/2012  . Fibroids 01/03/2012    Past Surgical History  Procedure Laterality Date  . Cholecystectomy    . Abdominal hysterectomy    . Oophrectomy      No family history on file.  History  Substance Use Topics  . Smoking status: Never Smoker   . Smokeless tobacco: Never Used  . Alcohol Use: No     Comment: RARELY    OB History   Grav Para Term Preterm Abortions TAB SAB Ect Mult Living                  Review of Systems  All other systems reviewed and are  negative.    Allergies  Aspirin; Darvocet; Dilaudid; Isometheptene-apap-dichloral; Penicillins; and Wellbutrin  Home Medications   Current Outpatient Rx  Name  Route  Sig  Dispense  Refill  . FLUoxetine (PROZAC) 20 MG tablet   Oral   Take 20 mg by mouth every morning.          Marland Kitchen HYDROcodone-acetaminophen (NORCO/VICODIN) 5-325 MG per tablet   Oral   Take 2 tablets by mouth every 4 (four) hours as needed for pain.   10 tablet   0   . mesalamine (ROWASA) 4 G enema   Rectal   Place 4 g rectally as needed. For constipation         . naproxen (NAPROSYN) 500 MG tablet   Oral   Take 1 tablet (500 mg total) by mouth 2 (two) times daily with a meal.   30 tablet   0   . triamterene-hydrochlorothiazide (MAXZIDE-25) 37.5-25 MG per tablet   Oral   Take 1 tablet by mouth every morning.           BP 160/85  Pulse 51  Temp(Src) 98.4 F (36.9 C) (Oral)  Resp 16  Wt 227 lb (102.967 kg)  BMI 37.77 kg/m2  SpO2 100%  Physical Exam  Nursing note and vitals reviewed. Constitutional: She is oriented to person, place, and time. She appears  well-developed and well-nourished.  HENT:  Head: Normocephalic and atraumatic.  Eyes: Conjunctivae and EOM are normal.  Neck: Normal range of motion.  Cardiovascular: Normal rate.   Distal pulses are intact, with brisk capillary refill  Pulmonary/Chest: Effort normal.  Abdominal: She exhibits no distension.  Musculoskeletal: Normal range of motion.  Left ankle is mildly tender to palpation, no gross abnormality or deformity, no bony tenderness, range of motion and strength is 5/5  Neurological: She is alert and oriented to person, place, and time.  Skin: Skin is dry.  Psychiatric: She has a normal mood and affect. Her behavior is normal. Judgment and thought content normal.    ED Course  Procedures (including critical care time)  Labs Reviewed - No data to display No results found.   1. Ankle pain, left       MDM  Patient  with ankle pain. She states that she sprained her ankle several weeks ago. It was healing well, and then she wore high heels yesterday at a wedding. She reports increased pain. She states that she's been unable to keep her pain pulse down, because of nausea. She is requesting something for nausea, as well as a shot of pain medicine here. She states that she has plenty of pain pills at home, and does not need anymore, but would like something here.  No bony tenderness. Patient is able to ambulate appropriately. Will refer back to orthopedics for her PCP. Patient given Phenergan for nausea. Patient is stable and ready for discharge.        Roxy Horseman, PA-C 01/24/13 1247

## 2013-02-05 ENCOUNTER — Emergency Department (HOSPITAL_COMMUNITY): Payer: Medicare Other

## 2013-02-05 ENCOUNTER — Encounter (HOSPITAL_COMMUNITY): Payer: Self-pay | Admitting: Emergency Medicine

## 2013-02-05 ENCOUNTER — Emergency Department (HOSPITAL_COMMUNITY)
Admission: EM | Admit: 2013-02-05 | Discharge: 2013-02-06 | Disposition: A | Discharge status: Home / Self Care | Payer: Medicare Other | Attending: Emergency Medicine | Admitting: Emergency Medicine

## 2013-02-05 DIAGNOSIS — R42 Dizziness and giddiness: Secondary | ICD-10-CM | POA: Insufficient documentation

## 2013-02-05 DIAGNOSIS — G40909 Epilepsy, unspecified, not intractable, without status epilepticus: Secondary | ICD-10-CM | POA: Insufficient documentation

## 2013-02-05 DIAGNOSIS — R079 Chest pain, unspecified: Secondary | ICD-10-CM

## 2013-02-05 DIAGNOSIS — R630 Anorexia: Secondary | ICD-10-CM | POA: Diagnosis not present

## 2013-02-05 DIAGNOSIS — R0789 Other chest pain: Secondary | ICD-10-CM | POA: Insufficient documentation

## 2013-02-05 DIAGNOSIS — Z8619 Personal history of other infectious and parasitic diseases: Secondary | ICD-10-CM | POA: Diagnosis not present

## 2013-02-05 DIAGNOSIS — G8929 Other chronic pain: Secondary | ICD-10-CM | POA: Diagnosis not present

## 2013-02-05 DIAGNOSIS — Z79899 Other long term (current) drug therapy: Secondary | ICD-10-CM | POA: Diagnosis not present

## 2013-02-05 DIAGNOSIS — Z90710 Acquired absence of both cervix and uterus: Secondary | ICD-10-CM | POA: Diagnosis not present

## 2013-02-05 DIAGNOSIS — Z8639 Personal history of other endocrine, nutritional and metabolic disease: Secondary | ICD-10-CM | POA: Insufficient documentation

## 2013-02-05 DIAGNOSIS — Z9079 Acquired absence of other genital organ(s): Secondary | ICD-10-CM | POA: Diagnosis not present

## 2013-02-05 DIAGNOSIS — R109 Unspecified abdominal pain: Secondary | ICD-10-CM

## 2013-02-05 DIAGNOSIS — Z8742 Personal history of other diseases of the female genital tract: Secondary | ICD-10-CM | POA: Diagnosis not present

## 2013-02-05 DIAGNOSIS — Z8719 Personal history of other diseases of the digestive system: Secondary | ICD-10-CM

## 2013-02-05 DIAGNOSIS — R0602 Shortness of breath: Secondary | ICD-10-CM | POA: Diagnosis not present

## 2013-02-05 DIAGNOSIS — R51 Headache: Secondary | ICD-10-CM | POA: Insufficient documentation

## 2013-02-05 DIAGNOSIS — F329 Major depressive disorder, single episode, unspecified: Secondary | ICD-10-CM | POA: Diagnosis not present

## 2013-02-05 DIAGNOSIS — R1031 Right lower quadrant pain: Secondary | ICD-10-CM | POA: Diagnosis not present

## 2013-02-05 DIAGNOSIS — R1032 Left lower quadrant pain: Secondary | ICD-10-CM | POA: Diagnosis not present

## 2013-02-05 DIAGNOSIS — I10 Essential (primary) hypertension: Secondary | ICD-10-CM | POA: Insufficient documentation

## 2013-02-05 DIAGNOSIS — Z9089 Acquired absence of other organs: Secondary | ICD-10-CM | POA: Insufficient documentation

## 2013-02-05 DIAGNOSIS — Z862 Personal history of diseases of the blood and blood-forming organs and certain disorders involving the immune mechanism: Secondary | ICD-10-CM | POA: Insufficient documentation

## 2013-02-05 DIAGNOSIS — R6883 Chills (without fever): Secondary | ICD-10-CM | POA: Diagnosis not present

## 2013-02-05 DIAGNOSIS — R112 Nausea with vomiting, unspecified: Secondary | ICD-10-CM | POA: Insufficient documentation

## 2013-02-05 DIAGNOSIS — F3289 Other specified depressive episodes: Secondary | ICD-10-CM | POA: Insufficient documentation

## 2013-02-05 DIAGNOSIS — Z8739 Personal history of other diseases of the musculoskeletal system and connective tissue: Secondary | ICD-10-CM | POA: Insufficient documentation

## 2013-02-05 DIAGNOSIS — J9819 Other pulmonary collapse: Secondary | ICD-10-CM | POA: Diagnosis not present

## 2013-02-05 LAB — COMPREHENSIVE METABOLIC PANEL
ALT: 15 U/L (ref 0–35)
CO2: 27 mEq/L (ref 19–32)
Calcium: 10 mg/dL (ref 8.4–10.5)
Creatinine, Ser: 0.59 mg/dL (ref 0.50–1.10)
GFR calc Af Amer: >90 mL/min (ref >90)
GFR calc non Af Amer: >90 mL/min (ref >90)
Glucose, Bld: 95 mg/dL (ref 70–99)

## 2013-02-05 LAB — POCT I-STAT TROPONIN I

## 2013-02-05 LAB — CBC WITH DIFFERENTIAL/PLATELET
Basophils Relative: 0 % (ref 0–1)
Eosinophils Relative: 0 % (ref 0–5)
HCT: 40.3 % (ref 36.0–46.0)
Hemoglobin: 13.4 g/dL (ref 12.0–15.0)
Lymphs Abs: 3.5 10*3/uL (ref 0.7–4.0)
MCH: 25 pg — ABNORMAL LOW (ref 26.0–34.0)
MCV: 75.3 fL — ABNORMAL LOW (ref 78.0–100.0)
Monocytes Absolute: 0.5 10*3/uL (ref 0.1–1.0)
RBC: 5.35 MIL/uL — ABNORMAL HIGH (ref 3.87–5.11)

## 2013-02-05 LAB — URINALYSIS, ROUTINE W REFLEX MICROSCOPIC
Glucose, UA: NEGATIVE mg/dL
Hgb urine dipstick: NEGATIVE
Specific Gravity, Urine: 1.042 — ABNORMAL HIGH (ref 1.005–1.030)
pH: 5.5 (ref 5.0–8.0)

## 2013-02-05 LAB — LIPASE, BLOOD: Lipase: 15 U/L (ref 11–59)

## 2013-02-05 MED ORDER — MORPHINE SULFATE 2 MG/ML IJ SOLN: 2.0000 mg | Freq: Once | INTRAMUSCULAR | Status: DC

## 2013-02-05 MED ORDER — ONDANSETRON HCL 4 MG/2ML IJ SOLN
4.0000 mg | Freq: Once | INTRAMUSCULAR | Status: AC
  Administered 2013-02-05: 4 mg via INTRAVENOUS
  Filled 2013-02-05: qty 2

## 2013-02-05 MED ORDER — MESALAMINE 4 G RE ENEM: 4.0000 g | ENEMA | Freq: Every day | RECTAL | Status: DC

## 2013-02-05 MED ORDER — SODIUM CHLORIDE 0.9 % IV BOLUS (SEPSIS)
1000.0000 mL | Freq: Once | INTRAVENOUS | Status: AC
  Administered 2013-02-05: 1000 mL via INTRAVENOUS

## 2013-02-05 MED ORDER — FENTANYL CITRATE 0.05 MG/ML IJ SOLN
100.0000 ug | Freq: Once | INTRAMUSCULAR | Status: AC
  Administered 2013-02-05: 100 ug via INTRAVENOUS
  Filled 2013-02-05: qty 2

## 2013-02-05 MED ORDER — IOHEXOL 350 MG/ML SOLN
100.0000 mL | Freq: Once | INTRAVENOUS | Status: AC | PRN
  Administered 2013-02-05: 100 mL via INTRAVENOUS

## 2013-02-05 MED ORDER — POTASSIUM CHLORIDE CRYS ER 20 MEQ PO TBCR
40.0000 meq | EXTENDED_RELEASE_TABLET | Freq: Once | ORAL | Status: AC
  Administered 2013-02-05: 40 meq via ORAL
  Filled 2013-02-05 (×2): qty 1

## 2013-02-05 MED ORDER — ONDANSETRON 4 MG PO TBDP
4.0000 mg | ORAL_TABLET | Freq: Once | ORAL | Status: AC
  Administered 2013-02-05: 4 mg via ORAL
  Filled 2013-02-05: qty 1

## 2013-02-05 NOTE — ED Provider Notes (Signed)
History     CSN: [id number]  Arrival date & time 02/05/13  1552   First MD Initiated Contact with Patient 02/05/13 1800      Chief Complaint  Patient presents with  . Chest Pain    (Consider location/radiation/quality/duration/timing/severity/associated sxs/prior treatment) HPI Comments: Patient is a 57 y/o with PMHx of HTN, UC, seizure, chronic back pain, DM presenting to the ED with chest pain, abdominal cramping. Patient reported that chest pain started yesterday and has gradually gotten worse today - stated that she feels a pressure on her chest as if her "grandson is sitting on me." Reported that the chest discomfort is primarily on the left side with radiation to the left shoulder and left side of neck. Patient stated that she feels somewhat better when she sits up, feels worse when she is laying down - reported symptoms of shortness of breathe and difficulty breathing. Stated that when she takes a deep breathe in she feels as if it does not completely go through her lungs. Patient reported that she has tingling within her left arm and hand that started yesterday. Patient stated that she was seeing a cardiologist, but failed to follow through with the doctor. Patient stated that she has a history of Ulcerative colitis - stated that the abdominal pain started yesterday and has gotten progressively worse today, reporting that she has constant abdominal cramping with episodes of vomiting and nausea that has been ongoing. Denied diarrhea, loose stools or blood in the stools. Patient reported decreased appetite and that she is unable to keep anything down. Patient reported having a seizure on Tuesday - has been extremely stressed out since Aunt has passed away - patient reported taking her medications, Dilantin, reported that she has not had one since last year and has not had an episode since Tuesday. Associated symptoms are headache, dizziness, chills. Denied fever, urinary symptoms, eye  complaints, visual distortions, ear complaints.    PCP: Dr. Tiburcio Pea GI: [facility]  The history is provided by the patient. No language interpreter was used.    Past Medical History  Diagnosis Date  . Hypertension   . Ulcerative colitis   . Migraines   . Seizure disorder   . HTN (hypertension), benign 01/03/2012  . Hx of migraines 01/03/2012  . Seizure 01/03/2012  . Depression 01/03/2012  . Chronic back pain 01/03/2012  . 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 Hyperlipidemia 01/03/2012  . Hepatitis C 01/03/2012  . Fibroids 01/03/2012    Past Surgical History  Procedure Laterality Date  . Cholecystectomy    . Abdominal hysterectomy    . Oophrectomy      No family history on file.  History  Substance Use Topics  . Smoking status: Never Smoker   . Smokeless tobacco: Never Used  . Alcohol Use: No     Comment: RARELY    OB History   Grav Para Term Preterm Abortions TAB SAB Ect Mult Living                  Review of Systems  Constitutional: Positive for chills. Negative for fever.  HENT: Negative for hearing loss, ear pain, sore throat and trouble swallowing.   Eyes: Negative for pain and visual disturbance.  Respiratory: Positive for shortness of breath. Negative for cough and chest tightness.   Cardiovascular: Positive for chest pain. Negative for palpitations and leg swelling.  Gastrointestinal: Positive for nausea, vomiting and abdominal pain. Negative for diarrhea and constipation.  Genitourinary: Negative for dysuria, decreased  urine volume, vaginal bleeding, vaginal discharge and difficulty urinating.  Neurological: Positive for dizziness and headaches. Negative for weakness and numbness.  All other systems reviewed and are negative.    Allergies  Aspirin; Darvocet; Dilaudid; Isometheptene-apap-dichloral; Penicillins; and Wellbutrin  Home Medications   Current Outpatient Rx  Name  Route  Sig  Dispense  Refill  . FLUoxetine (PROZAC) 20 MG  tablet   Oral   Take 20 mg by mouth every morning.          . mesalamine (ROWASA) 4 G enema   Rectal   Place 4 g rectally at bedtime as needed (constiaption).          . phenytoin (DILANTIN) 100 MG ER capsule   Oral   Take by mouth 4 (four) times daily.         Marland Kitchen triamterene-hydrochlorothiazide (MAXZIDE-25) 37.5-25 MG per tablet   Oral   Take 1 tablet by mouth every morning.         . mesalamine (ROWASA) 4 G enema   Rectal   Place 60 mLs (4 g total) rectally at bedtime.   1000 mL   0     BP 100/86  Pulse 83  Temp(Src) 97.7 F (36.5 C) (Oral)  Resp 13  Ht 5' 4.5" (1.638 m)  Wt 225 lb (102.059 kg)  BMI 38.04 kg/m2  SpO2 99%  Physical Exam  Nursing note and vitals reviewed. Constitutional: She is oriented to person, place, and time. She appears well-developed and well-nourished. No distress.  HENT:  Head: Normocephalic and atraumatic.  Mouth/Throat: Oropharynx is clear and moist. No oropharyngeal exudate.  Eyes: Conjunctivae and EOM are normal. Pupils are equal, round, and reactive to light. Right eye exhibits no discharge. Left eye exhibits no discharge.  Neck: Normal range of motion. Neck supple. No tracheal deviation present. No thyromegaly present.  Negative lymphadenopathy  Cardiovascular: Normal rate, regular rhythm, normal heart sounds and intact distal pulses.  Exam reveals no friction rub.   No murmur heard. Negative leg swelling Negative pitting edema Radial pulses 2+ bilaterally Pedal pulses 2+ bilaterally  Pulmonary/Chest: Breath sounds normal. No respiratory distress. She has no wheezes. She has no rales. She exhibits tenderness.  Pain upon palpation to left sternal border and left breast - no radiation.   Abdominal: Soft. Bowel sounds are normal. She exhibits no distension. There is tenderness. There is no rebound and no guarding.  Tenderness to palpation to right and left lower quadrants.  Lymphadenopathy:    She has no cervical adenopathy.   Neurological: She is alert and oriented to person, place, and time. No cranial nerve deficit. She exhibits normal muscle tone. Coordination normal.  Skin: Skin is warm and dry. No rash noted. She is not diaphoretic. No erythema.  Psychiatric: She has a normal mood and affect. Her behavior is normal. Thought content normal.    ED Course  Procedures (including critical care time)  Labs Reviewed  CBC WITH DIFFERENTIAL - Abnormal; Notable for the following:    RBC 5.35 (*)    MCV 75.3 (*)    MCH 25.0 (*)    Neutrophils Relative 29 (*)    Lymphocytes Relative 62 (*)    Neutro Abs 1.6 (*)    All other components within normal limits  COMPREHENSIVE METABOLIC PANEL - Abnormal; Notable for the following:    Sodium 133 (*)    Potassium 3.1 (*)    Chloride 93 (*)    Total Protein 8.6 (*)  All other components within normal limits  PHENYTOIN LEVEL, TOTAL - Abnormal; Notable for the following:    Phenytoin Lvl <2.5 (*)    All other components within normal limits  D-DIMER, QUANTITATIVE - Abnormal; Notable for the following:    D-Dimer, Quant 0.99 (*)    All other components within normal limits  URINALYSIS, ROUTINE W REFLEX MICROSCOPIC - Abnormal; Notable for the following:    Specific Gravity, Urine 1.042 (*)    Ketones, ur 15 (*)    All other components within normal limits  LIPASE, BLOOD  POCT I-STAT TROPONIN I   Dg Chest 2 View  02/05/2013  *RADIOLOGY REPORT*  Clinical Data: Chest pain, dizziness, nausea  CHEST - 2 VIEW  Comparison: Chest x-ray of 04/26/2012  Findings: No active infiltrate or effusion is seen.  Mediastinal contours appear normal.  The heart is within normal limits in size. No bony abnormality is seen.  IMPRESSION: No active lung disease.   Original Report Authenticated By: Dwyane Dee, M.D.    Ct Angio Chest Pe W/cm &/or Wo Cm  02/05/2013  *RADIOLOGY REPORT*  Clinical Data: Chest pain and shortness of breath; elevated D- dimer.  CT ANGIOGRAPHY CHEST  Technique:   Multidetector CT imaging of the chest using the standard protocol during bolus administration of intravenous contrast. Multiplanar reconstructed images including MIPs were obtained and reviewed to evaluate the vascular anatomy.  Contrast: OMNIPAQUE IOHEXOL 350 MG/ML SOLN  Comparison: Chest radiograph performed earlier today at 04:40 p.m.  Findings: There is no evidence of pulmonary embolus.  Minimal bibasilar atelectasis is noted.  The lungs are otherwise clear.  There is no evidence of significant focal consolidation, pleural effusion or pneumothorax.  No masses are identified; no abnormal focal contrast enhancement is seen.  The mediastinum is unremarkable in appearance.  No mediastinal lymphadenopathy is seen.  No pericardial effusion is identified. The great vessels are grossly unremarkable in appearance.  No axillary lymphadenopathy is seen.  The thyroid gland is unremarkable in appearance.  The visualized portions of the liver and spleen are unremarkable. There is distension of the pancreatic duct to 0.4 cm in maximal diameter, of uncertain significance; this appears grossly stable from the CT of the abdomen and pelvis performed 10/21/2011.  The pancreatic head is incompletely imaged on this study.  The patient is status post cholecystectomy.  The adrenal glands and visualized portions of both kidneys are grossly unremarkable, though evaluation for renal stones is suboptimal due to contrast within the renal calyces.  No acute osseous abnormalities are seen.  IMPRESSION:  1.  No evidence of pulmonary embolus. 2.  Minimal bibasilar atelectasis noted; lungs otherwise clear. 3.  Stable dilatation of the pancreatic duct, of uncertain significance; this may reflect the patient's baseline status post cholecystectomy.   Original Report Authenticated By: Tonia Ghent, M.D.     Date: 02/05/2013  Rate: 96  Rhythm: normal sinus rhythm  QRS Axis: normal  Intervals: normal  ST/T Wave abnormalities: nonspecific  T wave changes  Conduction Disutrbances:none  Narrative Interpretation:   Old EKG Reviewed: unchanged last EKG performed 06/18/2012    1. Chest pain   2. Shortness of breath   3. Abdominal pain   4. History of ulcerative colitis       MDM  Patient afebrile, normotensive, non-tachycardic, alert and oriented. Mild tachypnea upon arrival to ED. I personally evaluated and examined the patient. Discussed case with Dr. Anitra Lauth.  CBC negative findings. CMP hyponatremia and hypokalemia - patient placed on IV  fluids NS and potassium supplement given. UA negative findings. Phenytoin level < 2.5. EKG negative findings for STEMI/NSTEMI - Troponin negative findings r/o MI. Chest xray negative findings, no acute lung disease. D-Dimer 0.99. CT Angio of Chest no evidence of pulmonary embolism.     IV saline lock placed. IV fluids NS given. Zofran PO and IV given for nausea. Fentanyl IV given for pain.  Patient re-evaluated and pain persists - patient given another dose of Fentanyl IV. Patient re-evaluated once more - appears to be doing well and pain controlled in ED setting. Patient aseptic, non-toxic appearing, no distress, alert and oriented. No sign of pulmonary embolism. No STEMI/NSTEMI noted. Discharged patient. Etiology of chest pain unknown, possibly due to stressful times. Abdominal cramping mainly due to Ulcerative Colitis flare-up, patient reported that this is how she usually presents with the flare-ups. Discharged patient. Discussed with patient to follow-up with PCP and Gastroenterologist on Monday regarding visit to ED and recent complaints. Discharged patient with Rowasa - reported that she did not have any left and is the main medication she uses during her flare-ups with ulcerative colitis. Discussed with patient to stay hydrated, relax, to eat a lite diet. Discussed with patient to monitor symptoms and if symptoms are to worsen or change to report back to the ED. Patient agreed  to plan of care, understood, all questions answered.       Raymon Mutton, PA-C 02/06/13 [phone]

## 2013-02-05 NOTE — ED Notes (Addendum)
Pt c/o chest pain onset this am describes as squeezing, also c/o abd cramping since yesterday.  Nausea without vomiting.  When pt came into ED she was witnessed sitting down on floor st's she was dizzy. Pt also c/o headache and st's the lights are hurting her eyes.

## 2013-02-06 NOTE — ED Provider Notes (Signed)
Medical screening examination/treatment/procedure(s) were performed by non-physician practitioner and as supervising physician I was immediately available for consultation/collaboration.   Gwyneth Sprout, MD 02/06/13 1352

## 2013-02-26 DIAGNOSIS — I10 Essential (primary) hypertension: Secondary | ICD-10-CM | POA: Diagnosis not present

## 2013-02-26 DIAGNOSIS — F329 Major depressive disorder, single episode, unspecified: Secondary | ICD-10-CM | POA: Diagnosis not present

## 2013-02-26 DIAGNOSIS — K5190 Ulcerative colitis, unspecified, without complications: Secondary | ICD-10-CM | POA: Diagnosis not present

## 2013-02-26 DIAGNOSIS — E785 Hyperlipidemia, unspecified: Secondary | ICD-10-CM | POA: Diagnosis not present

## 2013-02-26 DIAGNOSIS — M545 Low back pain: Secondary | ICD-10-CM | POA: Diagnosis not present

## 2013-02-26 DIAGNOSIS — R569 Unspecified convulsions: Secondary | ICD-10-CM | POA: Diagnosis not present

## 2013-02-26 DIAGNOSIS — G43909 Migraine, unspecified, not intractable, without status migrainosus: Secondary | ICD-10-CM | POA: Diagnosis not present

## 2013-03-10 ENCOUNTER — Telehealth: Payer: Self-pay | Admitting: Internal Medicine

## 2013-03-10 NOTE — Telephone Encounter (Signed)
Received records from Dr. Loreta Ave and Dr. Dulce Sellar. Pt is wanting to switch from [facility] because Dr. Loreta Ave does not take pt. Serious and Dr. Dulce Sellar has no bedside manors.  Placed the records in Dr. Lauro Franklin office to be reviewed

## 2013-03-31 NOTE — Telephone Encounter (Signed)
Dr. Rhea Belton and Dr. Larae Grooms reviewed pt's records and declined to see pt. at this time.  Called and spoke to pt and informed her of the decision. She told me to shred her medical records

## 2013-05-16 ENCOUNTER — Emergency Department (HOSPITAL_COMMUNITY)
Admission: EM | Admit: 2013-05-16 | Discharge: 2013-05-16 | Disposition: A | Discharge status: Home / Self Care | Payer: Medicare Other | Attending: Emergency Medicine | Admitting: Emergency Medicine

## 2013-05-16 DIAGNOSIS — Y929 Unspecified place or not applicable: Secondary | ICD-10-CM | POA: Insufficient documentation

## 2013-05-16 DIAGNOSIS — Z79899 Other long term (current) drug therapy: Secondary | ICD-10-CM | POA: Insufficient documentation

## 2013-05-16 DIAGNOSIS — Z8679 Personal history of other diseases of the circulatory system: Secondary | ICD-10-CM | POA: Insufficient documentation

## 2013-05-16 DIAGNOSIS — K5289 Other specified noninfective gastroenteritis and colitis: Secondary | ICD-10-CM | POA: Insufficient documentation

## 2013-05-16 DIAGNOSIS — F329 Major depressive disorder, single episode, unspecified: Secondary | ICD-10-CM | POA: Insufficient documentation

## 2013-05-16 DIAGNOSIS — K529 Noninfective gastroenteritis and colitis, unspecified: Secondary | ICD-10-CM

## 2013-05-16 DIAGNOSIS — I10 Essential (primary) hypertension: Secondary | ICD-10-CM | POA: Diagnosis not present

## 2013-05-16 DIAGNOSIS — B1920 Unspecified viral hepatitis C without hepatic coma: Secondary | ICD-10-CM | POA: Insufficient documentation

## 2013-05-16 DIAGNOSIS — S30860A Insect bite (nonvenomous) of lower back and pelvis, initial encounter: Secondary | ICD-10-CM | POA: Diagnosis not present

## 2013-05-16 DIAGNOSIS — F3289 Other specified depressive episodes: Secondary | ICD-10-CM | POA: Insufficient documentation

## 2013-05-16 DIAGNOSIS — E785 Hyperlipidemia, unspecified: Secondary | ICD-10-CM | POA: Insufficient documentation

## 2013-05-16 DIAGNOSIS — W57XXXA Bitten or stung by nonvenomous insect and other nonvenomous arthropods, initial encounter: Secondary | ICD-10-CM | POA: Diagnosis not present

## 2013-05-16 DIAGNOSIS — Y939 Activity, unspecified: Secondary | ICD-10-CM | POA: Insufficient documentation

## 2013-05-16 LAB — BASIC METABOLIC PANEL
CO2: 28 mEq/L (ref 19–32)
Calcium: 9.5 mg/dL (ref 8.4–10.5)
Glucose, Bld: 93 mg/dL (ref 70–99)
Sodium: 136 mEq/L (ref 135–145)

## 2013-05-16 LAB — URINALYSIS, ROUTINE W REFLEX MICROSCOPIC
Glucose, UA: NEGATIVE mg/dL
Hgb urine dipstick: NEGATIVE
Protein, ur: NEGATIVE mg/dL
Specific Gravity, Urine: 1.015 (ref 1.005–1.030)

## 2013-05-16 LAB — CBC WITH DIFFERENTIAL/PLATELET
Eosinophils Relative: 0 % (ref 0–5)
HCT: 36.8 % (ref 36.0–46.0)
Hemoglobin: 11.7 g/dL — ABNORMAL LOW (ref 12.0–15.0)
Lymphocytes Relative: 50 % — ABNORMAL HIGH (ref 12–46)
Lymphs Abs: 2.4 10*3/uL (ref 0.7–4.0)
MCV: 77.6 fL — ABNORMAL LOW (ref 78.0–100.0)
Monocytes Absolute: 0.5 10*3/uL (ref 0.1–1.0)
Platelets: 299 10*3/uL (ref 150–400)
RBC: 4.74 MIL/uL (ref 3.87–5.11)
WBC: 4.7 10*3/uL (ref 4.0–10.5)

## 2013-05-16 MED ORDER — DICYCLOMINE HCL 20 MG PO TABS: 20.0000 mg | ORAL_TABLET | Freq: Two times a day (BID) | ORAL | Status: DC

## 2013-05-16 MED ORDER — KETOROLAC TROMETHAMINE 30 MG/ML IJ SOLN
30.0000 mg | Freq: Once | INTRAMUSCULAR | Status: DC
  Filled 2013-05-16: qty 1

## 2013-05-16 MED ORDER — MESALAMINE 4 G RE ENEM: 4.0000 g | ENEMA | Freq: Every evening | RECTAL | Status: DC | PRN

## 2013-05-16 MED ORDER — PROMETHAZINE HCL 25 MG/ML IJ SOLN
25.0000 mg | Freq: Once | INTRAMUSCULAR | Status: AC
  Administered 2013-05-16: 25 mg via INTRAVENOUS
  Filled 2013-05-16: qty 1

## 2013-05-16 MED ORDER — FENTANYL CITRATE 0.05 MG/ML IJ SOLN
100.0000 ug | Freq: Once | INTRAMUSCULAR | Status: AC
  Administered 2013-05-16: 100 ug via INTRAVENOUS
  Filled 2013-05-16: qty 2

## 2013-05-16 MED ORDER — PROMETHAZINE HCL 25 MG RE SUPP: 25.0000 mg | Freq: Four times a day (QID) | RECTAL | Status: DC | PRN

## 2013-05-16 MED ORDER — SODIUM CHLORIDE 0.9 % IV BOLUS (SEPSIS)
1000.0000 mL | Freq: Once | INTRAVENOUS | Status: AC
  Administered 2013-05-16: 1000 mL via INTRAVENOUS

## 2013-05-16 MED ORDER — DICYCLOMINE HCL 10 MG PO CAPS
10.0000 mg | ORAL_CAPSULE | Freq: Once | ORAL | Status: AC
  Administered 2013-05-16: 10 mg via ORAL
  Filled 2013-05-16: qty 1

## 2013-05-16 NOTE — ED Notes (Addendum)
IV team called by charge nurse earlier and returned call stating they will come to ED asap they are in emergency upstairs.

## 2013-05-16 NOTE — ED Notes (Signed)
Patient state she saw a spider in her closet x 3 days ago and later in the day started to have  stinging in the upper left chest and now has a red in color and 5 cm round sore in the said area. Patient states she has colitis and has N/V/D x 2 days and denies fever.

## 2013-05-16 NOTE — ED Provider Notes (Signed)
Medical screening examination/treatment/procedure(s) were performed by non-physician practitioner and as supervising physician I was immediately available for consultation/collaboration.  Lyanne Co, MD 05/16/13 [phone]

## 2013-05-16 NOTE — ED Notes (Signed)
Called and requested Bentyl from pharmacy.

## 2013-05-16 NOTE — ED Provider Notes (Signed)
CSN: [id number]     Arrival date & time 05/16/13  0827 History     First MD Initiated Contact with Patient 05/16/13 [phone]     No chief complaint on file.  (Consider location/radiation/quality/duration/timing/severity/associated sxs/prior Treatment) HPI  Erin Wolf is a 57 y.o.female with a significant PMH of hypertension, ulcerative colitis, seizure disorder, hypertension, hepatitis and fibroids presents to the ER with complaints of spider bite and a colitis flare.  Patient describes being bit by a spider 3 days ago on her left upper chest is tender and red. She denies having any discharge coming from the area or spreading of the redness. Patient describes it as resolving.  After being bit by a spider the patient started to develop some diffuse abdominal pain which she describes as being consistent with a ulcerative colitis flare. She is having nausea, vomiting, diarrhea and crampy and sharp pains. She takes Rowasa at home but does not have any left. She says that it has been helping and using her colon. She admits that she has to get admitted for this sometimes but feels like she's coming early enough that it can be avoided. She has not been eating and drinking as much as normal because of the pain and vomiting.   Past Medical History  Diagnosis Date  . Hypertension   . Ulcerative colitis   . Migraines   . Seizure disorder   . HTN (hypertension), benign 01/03/2012  . Hx of migraines 01/03/2012  . Seizure 01/03/2012  . Depression 01/03/2012  . Chronic back pain 01/03/2012  . 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 Hyperlipidemia 01/03/2012  . Hepatitis C 01/03/2012  . Fibroids 01/03/2012   Past Surgical History  Procedure Laterality Date  . Cholecystectomy    . Abdominal hysterectomy    . Oophrectomy     No family history on file. History  Substance Use Topics  . Smoking status: Never Smoker   . Smokeless tobacco: Never Used  . Alcohol Use: No     Comment: RARELY   OB History   Grav Para Term Preterm Abortions TAB SAB Ect Mult Living                 Review of Systems  Gastrointestinal: Positive for nausea, vomiting, abdominal pain and diarrhea.  Skin: Positive for wound.  All other systems reviewed and are negative.    Allergies  Aspirin; Darvocet; Dilaudid; Isometheptene-apap-dichloral; Penicillins; and Wellbutrin  Home Medications   Current Outpatient Rx  Name  Route  Sig  Dispense  Refill  . acetaminophen (TYLENOL) 500 MG tablet   Oral   Take 1,000 mg by mouth daily as needed for pain.         Marland Kitchen FLUoxetine (PROZAC) 20 MG capsule   Oral   Take 20 mg by mouth every morning.         . mesalamine (ROWASA) 4 G enema   Rectal   Place 4 g rectally at bedtime as needed (constiaption).          . phenytoin (DILANTIN) 100 MG ER capsule   Oral   Take 200 mg by mouth daily.          Marland Kitchen triamterene-hydrochlorothiazide (MAXZIDE-25) 37.5-25 MG per tablet   Oral   Take 1 tablet by mouth every morning.         . dicyclomine (BENTYL) 20 MG tablet   Oral   Take 1 tablet (20 mg total) by mouth 2 (two) times daily.   20 tablet  0   . mesalamine (ROWASA) 4 G enema   Rectal   Place 60 mLs (4 g total) rectally at bedtime as needed.   3 Bottle   2   . promethazine (PHENERGAN) 25 MG suppository   Rectal   Place 1 suppository (25 mg total) rectally every 6 (six) hours as needed for nausea.   12 each   0    BP 129/67  Pulse 95  Temp(Src) 98.7 F (37.1 C) (Oral)  Resp 18  Ht 5\' 4"  (1.626 m)  Wt 220 lb (99.791 kg)  BMI 37.74 kg/m2  SpO2 94% Physical Exam  Nursing note and vitals reviewed. Constitutional: She appears well-developed and well-nourished. No distress.  HENT:  Head: Normocephalic and atraumatic.  Eyes: Pupils are equal, round, and reactive to light.  Neck: Normal range of motion. Neck supple.  Cardiovascular: Normal rate and regular rhythm.   Pulmonary/Chest: Effort normal.    Abdominal: Soft. There is tenderness  (diffuse. No localized tenderness). There is no rigidity, no rebound, no guarding, no tenderness at McBurney's point and negative Murphy's sign.  Neurological: She is alert.  Skin: Skin is warm and dry.    ED Course   Procedures (including critical care time)  Labs Reviewed  CBC WITH DIFFERENTIAL - Abnormal; Notable for the following:    Hemoglobin 11.7 (*)    MCV 77.6 (*)    MCH 24.7 (*)    RDW 16.2 (*)    Neutrophils Relative % 39 (*)    Lymphocytes Relative 50 (*)    All other components within normal limits  BASIC METABOLIC PANEL - Abnormal; Notable for the following:    Potassium 3.2 (*)    All other components within normal limits  URINALYSIS, ROUTINE W REFLEX MICROSCOPIC - Abnormal; Notable for the following:    APPearance CLOUDY (*)    All other components within normal limits   No results found. 1. Colitis   2. Spider bite wound, initial encounter     MDM  Labs are unremarkable for acute abnormalities. Pt had no episodes of vomiting or diarrhea in the ED and says she is feeling much better. Really likes bentyl. Will refill meds and refer back to Dr. Levin Erp. Abdomen re-evaluated before dc, soft, no tenderness or distention.  57 y.o.Erin Wolf's evaluation in the Emergency Department is complete. It has been determined that no acute conditions requiring further emergency intervention are present at this time. The patient/guardian have been advised of the diagnosis and plan. We have discussed signs and symptoms that warrant return to the ED, such as changes or worsening in symptoms.  Vital signs are stable at discharge. Filed Vitals:   05/16/13 1130  BP: 129/67  Pulse: 95  Temp:   Resp:     Patient/guardian has voiced understanding and agreed to follow-up with the PCP or specialist.   Dorthula Matas, PA-C 05/16/13 1407

## 2013-05-16 NOTE — ED Notes (Signed)
Patient presents to ED with a spider to her upper left chest x 3 days ago. Area appears red and swollen and painful to touch and os approx 5 cm round. Patient also states she is suffering with colitis x 2 days. Nausea and diarrhea and 3 episodes of vomiting.

## 2013-05-27 ENCOUNTER — Inpatient Hospital Stay (HOSPITAL_COMMUNITY)
Admission: EM | Admit: 2013-05-27 | Discharge: 2013-05-31 | DRG: 387 | Disposition: A | Discharge status: Home / Self Care | Payer: Medicare Other | Attending: Internal Medicine | Admitting: Internal Medicine

## 2013-05-27 ENCOUNTER — Encounter (HOSPITAL_COMMUNITY): Payer: Self-pay | Admitting: *Deleted

## 2013-05-27 ENCOUNTER — Emergency Department (HOSPITAL_COMMUNITY): Payer: Medicare Other

## 2013-05-27 DIAGNOSIS — S1093XA Contusion of unspecified part of neck, initial encounter: Secondary | ICD-10-CM | POA: Diagnosis not present

## 2013-05-27 DIAGNOSIS — Z8669 Personal history of other diseases of the nervous system and sense organs: Secondary | ICD-10-CM

## 2013-05-27 DIAGNOSIS — R569 Unspecified convulsions: Secondary | ICD-10-CM | POA: Diagnosis not present

## 2013-05-27 DIAGNOSIS — R7401 Elevation of levels of liver transaminase levels: Secondary | ICD-10-CM

## 2013-05-27 DIAGNOSIS — E876 Hypokalemia: Secondary | ICD-10-CM

## 2013-05-27 DIAGNOSIS — M329 Systemic lupus erythematosus, unspecified: Secondary | ICD-10-CM | POA: Diagnosis not present

## 2013-05-27 DIAGNOSIS — R109 Unspecified abdominal pain: Secondary | ICD-10-CM | POA: Diagnosis not present

## 2013-05-27 DIAGNOSIS — K5120 Ulcerative (chronic) proctitis without complications: Secondary | ICD-10-CM | POA: Diagnosis not present

## 2013-05-27 DIAGNOSIS — B1920 Unspecified viral hepatitis C without hepatic coma: Secondary | ICD-10-CM | POA: Diagnosis not present

## 2013-05-27 DIAGNOSIS — R51 Headache: Secondary | ICD-10-CM | POA: Diagnosis not present

## 2013-05-27 DIAGNOSIS — R112 Nausea with vomiting, unspecified: Secondary | ICD-10-CM | POA: Diagnosis not present

## 2013-05-27 DIAGNOSIS — K5190 Ulcerative colitis, unspecified, without complications: Secondary | ICD-10-CM | POA: Diagnosis present

## 2013-05-27 DIAGNOSIS — D649 Anemia, unspecified: Secondary | ICD-10-CM

## 2013-05-27 DIAGNOSIS — Z800 Family history of malignant neoplasm of digestive organs: Secondary | ICD-10-CM

## 2013-05-27 DIAGNOSIS — R55 Syncope and collapse: Secondary | ICD-10-CM

## 2013-05-27 DIAGNOSIS — R0602 Shortness of breath: Secondary | ICD-10-CM

## 2013-05-27 DIAGNOSIS — K51919 Ulcerative colitis, unspecified with unspecified complications: Secondary | ICD-10-CM

## 2013-05-27 DIAGNOSIS — T148XXA Other injury of unspecified body region, initial encounter: Secondary | ICD-10-CM | POA: Diagnosis not present

## 2013-05-27 DIAGNOSIS — I10 Essential (primary) hypertension: Secondary | ICD-10-CM | POA: Diagnosis not present

## 2013-05-27 DIAGNOSIS — F32A Depression, unspecified: Secondary | ICD-10-CM

## 2013-05-27 DIAGNOSIS — K219 Gastro-esophageal reflux disease without esophagitis: Secondary | ICD-10-CM | POA: Diagnosis present

## 2013-05-27 DIAGNOSIS — K921 Melena: Secondary | ICD-10-CM | POA: Diagnosis not present

## 2013-05-27 DIAGNOSIS — F341 Dysthymic disorder: Secondary | ICD-10-CM

## 2013-05-27 DIAGNOSIS — G8929 Other chronic pain: Secondary | ICD-10-CM

## 2013-05-27 DIAGNOSIS — M545 Low back pain, unspecified: Secondary | ICD-10-CM | POA: Diagnosis present

## 2013-05-27 DIAGNOSIS — K648 Other hemorrhoids: Secondary | ICD-10-CM | POA: Diagnosis present

## 2013-05-27 DIAGNOSIS — D219 Benign neoplasm of connective and other soft tissue, unspecified: Secondary | ICD-10-CM

## 2013-05-27 DIAGNOSIS — E785 Hyperlipidemia, unspecified: Secondary | ICD-10-CM

## 2013-05-27 DIAGNOSIS — R197 Diarrhea, unspecified: Secondary | ICD-10-CM

## 2013-05-27 DIAGNOSIS — F329 Major depressive disorder, single episode, unspecified: Secondary | ICD-10-CM | POA: Diagnosis present

## 2013-05-27 DIAGNOSIS — K599 Functional intestinal disorder, unspecified: Secondary | ICD-10-CM | POA: Diagnosis not present

## 2013-05-27 DIAGNOSIS — F3289 Other specified depressive episodes: Secondary | ICD-10-CM | POA: Diagnosis present

## 2013-05-27 DIAGNOSIS — G40909 Epilepsy, unspecified, not intractable, without status epilepticus: Secondary | ICD-10-CM | POA: Diagnosis not present

## 2013-05-27 DIAGNOSIS — K5730 Diverticulosis of large intestine without perforation or abscess without bleeding: Secondary | ICD-10-CM | POA: Diagnosis not present

## 2013-05-27 DIAGNOSIS — R933 Abnormal findings on diagnostic imaging of other parts of digestive tract: Secondary | ICD-10-CM | POA: Diagnosis not present

## 2013-05-27 DIAGNOSIS — M549 Dorsalgia, unspecified: Secondary | ICD-10-CM

## 2013-05-27 HISTORY — DX: Gastro-esophageal reflux disease without esophagitis: K21.9

## 2013-05-27 HISTORY — DX: Systemic lupus erythematosus, unspecified: M32.9

## 2013-05-27 HISTORY — DX: Other chronic pain: G89.29

## 2013-05-27 HISTORY — DX: Low back pain, unspecified: M54.50

## 2013-05-27 HISTORY — DX: Anxiety disorder, unspecified: F41.9

## 2013-05-27 HISTORY — DX: Low back pain: M54.5

## 2013-05-27 LAB — CBC WITH DIFFERENTIAL/PLATELET
Eosinophils Relative: 0 % (ref 0–5)
HCT: 39 % (ref 36.0–46.0)
Hemoglobin: 13 g/dL (ref 12.0–15.0)
Lymphocytes Relative: 54 % — ABNORMAL HIGH (ref 12–46)
Lymphs Abs: 3.3 10*3/uL (ref 0.7–4.0)
MCV: 77.8 fL — ABNORMAL LOW (ref 78.0–100.0)
Monocytes Absolute: 0.5 10*3/uL (ref 0.1–1.0)
Monocytes Relative: 9 % (ref 3–12)
RBC: 5.01 MIL/uL (ref 3.87–5.11)
WBC: 6.1 10*3/uL (ref 4.0–10.5)

## 2013-05-27 LAB — URINALYSIS, ROUTINE W REFLEX MICROSCOPIC
Glucose, UA: NEGATIVE mg/dL
Hgb urine dipstick: NEGATIVE
Ketones, ur: 15 mg/dL — AB
Protein, ur: 30 mg/dL — AB
pH: 6 (ref 5.0–8.0)

## 2013-05-27 LAB — URINE MICROSCOPIC-ADD ON

## 2013-05-27 LAB — CBC
MCH: 25.7 pg — ABNORMAL LOW (ref 26.0–34.0)
MCV: 77.8 fL — ABNORMAL LOW (ref 78.0–100.0)
Platelets: 318 10*3/uL (ref 150–400)
RDW: 16.3 % — ABNORMAL HIGH (ref 11.5–15.5)

## 2013-05-27 LAB — BASIC METABOLIC PANEL
CO2: 28 mEq/L (ref 19–32)
Calcium: 9.7 mg/dL (ref 8.4–10.5)
Creatinine, Ser: 0.63 mg/dL (ref 0.50–1.10)
Glucose, Bld: 88 mg/dL (ref 70–99)

## 2013-05-27 LAB — PHENYTOIN LEVEL, TOTAL: Phenytoin Lvl: <2.5 ug/mL — ABNORMAL LOW (ref 10.0–20.0)

## 2013-05-27 MED ORDER — PHENYTOIN SODIUM EXTENDED 100 MG PO CAPS
200.0000 mg | ORAL_CAPSULE | Freq: Every day | ORAL | Status: DC
  Filled 2013-05-27: qty 2

## 2013-05-27 MED ORDER — FLUOXETINE HCL 20 MG PO CAPS
20.0000 mg | ORAL_CAPSULE | Freq: Every day | ORAL | Status: DC
  Administered 2013-05-28 – 2013-05-31 (×4): 20 mg via ORAL
  Filled 2013-05-27 (×4): qty 1

## 2013-05-27 MED ORDER — SODIUM CHLORIDE 0.9 % IJ SOLN
3.0000 mL | Freq: Two times a day (BID) | INTRAMUSCULAR | Status: DC
  Administered 2013-05-28 – 2013-05-30 (×5): 3 mL via INTRAVENOUS

## 2013-05-27 MED ORDER — SODIUM CHLORIDE 0.9 % IV SOLN
1000.0000 mg | Freq: Once | INTRAVENOUS | Status: AC
  Administered 2013-05-27: 1000 mg via INTRAVENOUS
  Filled 2013-05-27: qty 20

## 2013-05-27 MED ORDER — ONDANSETRON HCL 4 MG PO TABS: 4.0000 mg | ORAL_TABLET | Freq: Four times a day (QID) | ORAL | Status: DC | PRN

## 2013-05-27 MED ORDER — FENTANYL CITRATE 0.05 MG/ML IJ SOLN
100.0000 ug | Freq: Once | INTRAMUSCULAR | Status: AC
  Administered 2013-05-27: 100 ug via INTRAVENOUS
  Filled 2013-05-27: qty 2

## 2013-05-27 MED ORDER — TRIAMTERENE-HCTZ 37.5-25 MG PO TABS
1.0000 | ORAL_TABLET | ORAL | Status: DC
  Filled 2013-05-27: qty 1

## 2013-05-27 MED ORDER — ONDANSETRON HCL 4 MG/2ML IJ SOLN
4.0000 mg | Freq: Once | INTRAMUSCULAR | Status: AC
  Administered 2013-05-27: 4 mg via INTRAVENOUS
  Filled 2013-05-27: qty 2

## 2013-05-27 MED ORDER — METHYLPREDNISOLONE SODIUM SUCC 125 MG IJ SOLR
60.0000 mg | Freq: Three times a day (TID) | INTRAMUSCULAR | Status: DC
  Administered 2013-05-27 – 2013-05-29 (×6): 60 mg via INTRAVENOUS
  Filled 2013-05-27 (×8): qty 0.96

## 2013-05-27 MED ORDER — ACETAMINOPHEN 650 MG RE SUPP: 650.0000 mg | Freq: Four times a day (QID) | RECTAL | Status: DC | PRN

## 2013-05-27 MED ORDER — SODIUM CHLORIDE 0.9 % IV BOLUS (SEPSIS)
1000.0000 mL | Freq: Once | INTRAVENOUS | Status: AC
  Administered 2013-05-27: 1000 mL via INTRAVENOUS

## 2013-05-27 MED ORDER — MESALAMINE 4 G RE ENEM: 4.0000 g | ENEMA | Freq: Every evening | RECTAL | Status: DC | PRN

## 2013-05-27 MED ORDER — IOHEXOL 300 MG/ML  SOLN: 25.0000 mL | INTRAMUSCULAR | Status: DC

## 2013-05-27 MED ORDER — IOHEXOL 300 MG/ML  SOLN
100.0000 mL | Freq: Once | INTRAMUSCULAR | Status: AC | PRN
  Administered 2013-05-27: 100 mL via INTRAVENOUS

## 2013-05-27 MED ORDER — DIPHENHYDRAMINE HCL 25 MG PO CAPS
25.0000 mg | ORAL_CAPSULE | Freq: Four times a day (QID) | ORAL | Status: DC | PRN
  Administered 2013-05-28: 25 mg via ORAL
  Filled 2013-05-27: qty 1

## 2013-05-27 MED ORDER — SODIUM CHLORIDE 0.9 % IV SOLN: INTRAVENOUS | Status: DC

## 2013-05-27 MED ORDER — POTASSIUM CHLORIDE 10 MEQ/100ML IV SOLN
10.0000 meq | INTRAVENOUS | Status: AC
  Administered 2013-05-27 – 2013-05-28 (×4): 10 meq via INTRAVENOUS
  Filled 2013-05-27 (×4): qty 100

## 2013-05-27 MED ORDER — MORPHINE SULFATE 2 MG/ML IJ SOLN
INTRAMUSCULAR | Status: AC
  Administered 2013-05-27: 1 mg via INTRAVENOUS
  Filled 2013-05-27: qty 1

## 2013-05-27 MED ORDER — DICYCLOMINE HCL 20 MG PO TABS
20.0000 mg | ORAL_TABLET | Freq: Two times a day (BID) | ORAL | Status: DC
  Administered 2013-05-28 – 2013-05-31 (×8): 20 mg via ORAL
  Filled 2013-05-27 (×9): qty 1

## 2013-05-27 MED ORDER — ONDANSETRON HCL 4 MG/2ML IJ SOLN
4.0000 mg | Freq: Four times a day (QID) | INTRAMUSCULAR | Status: DC | PRN
  Administered 2013-05-30: 4 mg via INTRAVENOUS
  Filled 2013-05-27: qty 2

## 2013-05-27 MED ORDER — ACETAMINOPHEN 325 MG PO TABS: 650.0000 mg | ORAL_TABLET | Freq: Four times a day (QID) | ORAL | Status: DC | PRN

## 2013-05-27 MED ORDER — OXYCODONE HCL 5 MG PO TABS
5.0000 mg | ORAL_TABLET | ORAL | Status: DC | PRN
  Administered 2013-05-28 – 2013-05-31 (×8): 5 mg via ORAL
  Filled 2013-05-27 (×8): qty 1

## 2013-05-27 MED ORDER — ENOXAPARIN SODIUM 40 MG/0.4ML ~~LOC~~ SOLN
40.0000 mg | SUBCUTANEOUS | Status: DC
  Administered 2013-05-27 – 2013-05-29 (×3): 40 mg via SUBCUTANEOUS
  Filled 2013-05-27 (×4): qty 0.4

## 2013-05-27 MED ORDER — MORPHINE SULFATE 2 MG/ML IJ SOLN
1.0000 mg | INTRAMUSCULAR | Status: DC | PRN
  Administered 2013-05-28 (×2): 1 mg via INTRAVENOUS
  Filled 2013-05-27 (×2): qty 1

## 2013-05-27 NOTE — ED Notes (Signed)
Seizure unwitnessed, found post ictal by MD. Pt reports last seizure approx 1 yr ago. Has not been able to keep meds down past week due to n/v. Pt has hematoma to left side forehead, no incontinence or oral trauma noted. Pt c/o intermittent gen abd pain, n/v/d x 1 month. States this episode started 4 days ago. States stools are mucousy & bloody. Avg 2 stools daily & 2 emesis daily. Has been able to tolerate ginger ale & ice chips only

## 2013-05-27 NOTE — H&P (Addendum)
[facility] History and Physical  Erin Wolf:[id number] DOB: 03/25/1956 DOA: 05/27/2013  Referring physician: Dr Bernette Wolf PCP: Erin Blamer, MD  Specialists:   Chief Complaint: worsening abd pain and mucous,bloody stools  HPI: Erin Wolf is a 57 y.o. female with PMH significant for Ulcerative colitis and other medical history as listed below who presents with the above complaints. She states that she has had abd pain for sometime and about 1wk ago she was seen in the ED for the pain and d/ced home with rowasa enemas, antiemetic and pain meds. She states they seemed to help, but the abd pain began worsening- burning/crampy, subjective fevers, lower abd in location and she developed  Bloody 'mucousy' stools, was vomiting and unable to keep her meds down so she went to see PCP today. She states while waiting in room at [facility] office she was told she passed out- was unconcious when PCP found her in room, had bruised forehead and he thought she had seizure. EMS was called and she was brought to ED. Per EMS pt appeared to be postictal. In the ED she was found to have a subtherapeutic(<2.5) level. She was loaded with dilantin, CT of head was neg for acute findings, and CT of abd and pelvis showed findings c/w proctocolitis. She is admitted for further eval and management.  She states she had quit going to Erin Erin Wolf and wanted her PCP to help refer to a different GI.   Review of Systems: The patient denies, weight loss, vision loss, decreased hearing, hoarseness, chest pain, syncope, dyspnea on exertion, peripheral edema, balance deficits, hemoptysis, abdominal pain, melena, severe indigestion/heartburn, hematuria, incontinence, suspicious skin lesions, transient blindness, difficulty walking, depression, unusual weight change, abnormal bleeding, enlarged lymph nodes, angioedema, and breast masses.    Past Medical History  Diagnosis Date  . Hypertension   . Ulcerative colitis    . Migraines   . Seizure disorder   . HTN (hypertension), benign 01/03/2012  . Hx of migraines 01/03/2012  . Seizure 01/03/2012  . Depression 01/03/2012  . Chronic back pain 01/03/2012  . 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 Hyperlipidemia 01/03/2012  . Hepatitis C 01/03/2012  . Fibroids 01/03/2012   Past Surgical History  Procedure Laterality Date  . Cholecystectomy    . Abdominal hysterectomy    . Oophrectomy     Social History:  reports that she has never smoked. She has never used smokeless tobacco. She reports that she does not drink alcohol or use illicit drugs.  where does patient live--home  Can patient participate in ADLs  Allergies  Allergen Reactions  . Aspirin Nausea Only  . Darvocet (Propoxyphene-Acetaminophen) Nausea And Vomiting  . Dilaudid (Hydromorphone Hcl) Itching and Nausea Only  . Isometheptene-Apap-Dichloral Nausea Only  . Penicillins Hives  . Wellbutrin (Bupropion Hcl) Other (See Comments)    Insomnia and headaches   Family history - Her bro died of colon Cancer  Prior to Admission medications   Medication Sig Start Date End Date Taking? Authorizing Provider  dicyclomine (BENTYL) 20 MG tablet Take 1 tablet (20 mg total) by mouth 2 (two) times daily. 05/16/13  Yes Erin Irine Seal, PA-C  FLUoxetine (PROZAC) 20 MG capsule Take 20 mg by mouth every morning.   Yes Historical Provider, MD  phenytoin (DILANTIN) 100 MG ER capsule Take 200 mg by mouth daily.    Yes Historical Provider, MD  promethazine (PHENERGAN) 25 MG suppository Place 1 suppository (25 mg total) rectally every 6 (six) hours as needed for  nausea. 05/16/13  Yes Erin Irine Seal, PA-C  triamterene-hydrochlorothiazide (MAXZIDE-25) 37.5-25 MG per tablet Take 1 tablet by mouth every morning.   Yes Historical Provider, MD  acetaminophen (TYLENOL) 500 MG tablet Take 1,000 mg by mouth daily as needed for pain.    Historical Provider, MD  mesalamine (ROWASA) 4 G enema Place 4 g rectally at bedtime as needed  (constiaption). For inflammatory bowel disease    Historical Provider, MD   Physical Exam: Filed Vitals:   05/27/13 1930  BP: 120/71  Pulse: 84  Resp:    Constitutional: Vital signs reviewed.  Patient is a well-developed and well-nourished  in no acute distress and cooperative with exam. Alert and oriented x3.  Head: Normocephalic and atraumatic Nose: No erythema or drainage noted.  Mouth: no erythema or exudates, slightly dry MM Eyes: PERRL, EOMI, conjunctivae normal, No scleral icterus.  Neck: Supple, Trachea midline normal ROM, No JVD, mass, thyromegaly, or carotid bruit present.  Cardiovascular: RRR, S1 normal, S2 normal, no MRG, pulses symmetric and intact bilaterally Pulmonary/Chest: normal respiratory effort, CTAB, no wheezes, rales, or rhonchi Abdominal: Soft. Lower abd tenderness present, no rebound, non-distended, bowel sounds are normal, no masses, organomegaly, or guarding present.  Extremities:no cyanosis and no edema Neurological: A&O x3, Strength is normal and symmetric bilaterally, cranial nerve II-XII are grossly intact, no focal motor deficit, sensory intact to light touch bilaterally.  Skin: Warm, dry and intact. No rash, cyanosis.  Psychiatric: Normal mood and affect. speech and behavior is normal.   Labs on Admission:  Basic Metabolic Panel:  Recent Labs Lab 05/27/13 1411  NA 139  K 3.1*  CL 98  CO2 28  GLUCOSE 88  BUN 13  CREATININE 0.63  CALCIUM 9.7   Liver Function Tests: No results found for this basename: AST, ALT, ALKPHOS, BILITOT, PROT, ALBUMIN,  in the last 168 hours No results found for this basename: LIPASE, AMYLASE,  in the last 168 hours No results found for this basename: AMMONIA,  in the last 168 hours CBC:  Recent Labs Lab 05/27/13 1411  WBC 6.1  NEUTROABS 2.2  HGB 13.0  HCT 39.0  MCV 77.8*  PLT 345   Cardiac Enzymes: No results found for this basename: CKTOTAL, CKMB, CKMBINDEX, TROPONINI,  in the last 168 hours  BNP (last 3  results) No results found for this basename: PROBNP,  in the last 8760 hours CBG:  Recent Labs Lab 05/27/13 1422  GLUCAP 82    Radiological Exams on Admission: Ct Head Wo Contrast  05/27/2013   *RADIOLOGY REPORT*  Clinical Data: Seizure  CT HEAD WITHOUT CONTRAST  Technique:  Contiguous axial images were obtained from the base of the skull through the vertex without contrast.  Comparison: 01/03/2012  Findings: No evidence of parenchymal hemorrhage or extra-axial fluid collection. No mass lesion, mass effect, or midline shift.  No CT evidence of acute infarction.  Cerebral volume is age appropriate.  No ventriculomegaly.  Mucous retention cyst in the right sphenoid sinus.  The visualized paranasal sinuses are otherwise essentially clear.  The mastoid air cells are unopacified.  No evidence of calvarial fracture.  IMPRESSION: No evidence of acute intracranial abnormality.   Original Report Authenticated By: Charline Bills, M.D.   Ct Abdomen Pelvis W Contrast  05/27/2013   *RADIOLOGY REPORT*  Clinical Data: Abdominal pain.  Possible colitis.  CT ABDOMEN AND PELVIS WITH CONTRAST  Technique:  Multidetector CT imaging of the abdomen and pelvis was performed following the standard protocol during bolus administration  of intravenous contrast.  Contrast: OMNIPAQUE IOHEXOL 300 MG/ML  SOLN  Comparison: CT of the abdomen and pelvis 10/21/2011.  Findings:  Lung Bases: Unremarkable.  Abdomen/Pelvis:  Status post cholecystectomy.  Mild dilatation of the common bile duct (12 mm) and mild intrahepatic biliary ductal dilatation, similar to prior MRI examinations dating back to 01/06/2012, apparently chronic in this patient.  No definite focal hepatic lesion is noted at this time.  Mild diffuse dilatation of the main pancreatic duct is also unchanged compared to prior studies measuring up to 5-6 mm in diameter.  No focal pancreatic lesions are otherwise noted.  The appearance of the spleen, bilateral adrenal glands  and bilateral kidneys is unremarkable.  The transverse colon, descending colon, sigmoid colon and rectum are all essentially completely decompressed.  Even allowing for this decompression, there does appear to be some mild mural thickening which extends from the level of the rectum up to the level of the mid transverse colon.  There is some associated mild hypervascularity throughout the sigmoid mesocolon.  A few scattered colonic diverticula are noted, without focal surrounding inflammatory changes to strongly suggest the presence of an acute diverticulitis at this time.  No significant volume of ascites.  No pneumoperitoneum.  No pathologic distension of small bowel.  No definite pathologic lymphadenopathy identified within the abdomen or pelvis on today's examination.  Status post hysterectomy. Ovaries are not confidently identified may be surgically absent or atrophic.  Urinary bladder is normal in appearance.  Musculoskeletal: There are no aggressive appearing lytic or blastic lesions noted in the visualized portions of the skeleton.  IMPRESSION: 1.  Findings, as above, compatible with a proctocolitis.  Given the contiguity of the mild wall thickening from the rectum to the midtransverse colon, and mild associated hypervascularity in the sigmoid mesocolon, correlation for signs and symptoms of ulcerative colitis may be warranted. 2.  Numerous colonic diverticula without definite signs of acute diverticulitis at this time. 3.  Status post cholecystectomy with similar mild dilatation of the intrahepatic bile ducts, common bile duct, and main pancreatic duct compared to prior MRCP 01/06/2012. 4.  Additional incidental findings, as above.   Original Report Authenticated By: Trudie Reed, M.D.     Assessment/Plan Active Problems: Ulcerative colitis flare - Pt presenting as discussed above, CT with findings c/w proctocolitis -start steroids/solumedrol, continue Rowasa as previously -follow check stool  studies if increased stools -keep NPO for now, pain management -follow consult GI in am pending clinical course,she states she had stopped seeing Erin Erin Wolf  and wants to find a different GI and she was to follow up with her PCP for a new GI referral   Syncope and collapse-likely due to seizure vs orthostatic hypotension -Dilantin level subtherapeutic <2.5 and pt states she has been unable to keep down meds due to n/v -loaded with dilantin in ED, continue outpt dilantin, follow and recheck level in am -check orthostatics, hold diuretic, hydrate and follow  Seizure d/o -as above HTN (hypertension), benign -hold maxzide for now( given #1)  -monitor BP and treat accordingly Hypokalemia -due to GI losses -replace k    Code Status: full Family Communication: husband at bedside Disposition Plan: admit to tele  Time spent: >30  Kela Millin [facility] Pager [phone]  If 7PM-7AM, please contact night-coverage [email or web address] Password [facility] 05/27/2013, 7:45 PM

## 2013-05-27 NOTE — Progress Notes (Signed)
Pt admitted placed on tele. Pt comes from home with husband. Pt is AOX4 ambulatory. Pt educated on safety measures and precautions.

## 2013-05-27 NOTE — ED Notes (Signed)
Patient transported to CT 

## 2013-05-27 NOTE — ED Provider Notes (Signed)
CSN: [id number]     Arrival date & time 05/27/13  1358 History     First MD Initiated Contact with Patient 05/27/13 1407     Chief Complaint  Patient presents with  . Abdominal Pain   (Consider location/radiation/quality/duration/timing/severity/associated sxs/prior Treatment) Patient is a 57 y.o. female presenting with abdominal pain.  Abdominal Pain  Pt with history of ulcerative colitis reports severe diffuse cramping abdominal pain ongoing for the last month, seen in the ED for same about 10 days ago and had neg labs. Discharged home with PCP followup. She was at PCP office today when she apparently had a seizure, unwitnessed but hit her head and post-ictal by EMS report. She complains now of abdominal pain, nausea and headache. She has had fever at home but none today. Loose but non bloody stools recently.   Past Medical History  Diagnosis Date  . Hypertension   . Ulcerative colitis   . Migraines   . Seizure disorder   . HTN (hypertension), benign 01/03/2012  . Hx of migraines 01/03/2012  . Seizure 01/03/2012  . Depression 01/03/2012  . Chronic back pain 01/03/2012  . 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 Hyperlipidemia 01/03/2012  . Hepatitis C 01/03/2012  . Fibroids 01/03/2012   Past Surgical History  Procedure Laterality Date  . Cholecystectomy    . Abdominal hysterectomy    . Oophrectomy     No family history on file. History  Substance Use Topics  . Smoking status: Never Smoker   . Smokeless tobacco: Never Used  . Alcohol Use: No     Comment: RARELY   OB History   Grav Para Term Preterm Abortions TAB SAB Ect Mult Living                 Review of Systems  Gastrointestinal: Positive for abdominal pain.   All other systems reviewed and are negative except as noted in HPI.    Allergies  Aspirin; Darvocet; Dilaudid; Isometheptene-apap-dichloral; Penicillins; and Wellbutrin  Home Medications   Current Outpatient Rx  Name  Route  Sig  Dispense  Refill  .  dicyclomine (BENTYL) 20 MG tablet   Oral   Take 1 tablet (20 mg total) by mouth 2 (two) times daily.   20 tablet   0   . FLUoxetine (PROZAC) 20 MG capsule   Oral   Take 20 mg by mouth every morning.         . phenytoin (DILANTIN) 100 MG ER capsule   Oral   Take 200 mg by mouth daily.          . promethazine (PHENERGAN) 25 MG suppository   Rectal   Place 1 suppository (25 mg total) rectally every 6 (six) hours as needed for nausea.   12 each   0   . triamterene-hydrochlorothiazide (MAXZIDE-25) 37.5-25 MG per tablet   Oral   Take 1 tablet by mouth every morning.         Marland Kitchen acetaminophen (TYLENOL) 500 MG tablet   Oral   Take 1,000 mg by mouth daily as needed for pain.         . mesalamine (ROWASA) 4 G enema   Rectal   Place 4 g rectally at bedtime as needed (constiaption). For inflammatory bowel disease          BP 116/57  Pulse 89  Resp 17  SpO2 99% Physical Exam  Nursing note and vitals reviewed. Constitutional: She is oriented to person, place, and time. She appears well-developed  and well-nourished.  HENT:  Head: Normocephalic.  L forehead contusion/abrasion  Eyes: EOM are normal. Pupils are equal, round, and reactive to light.  Neck: Normal range of motion. Neck supple.  Cardiovascular: Normal rate, normal heart sounds and intact distal pulses.   Pulmonary/Chest: Effort normal and breath sounds normal.  Abdominal: Bowel sounds are normal. She exhibits no distension. There is tenderness (diffuse, non focal.). There is no rebound and no guarding.  Musculoskeletal: Normal range of motion. She exhibits no edema and no tenderness.  Neurological: She is alert and oriented to person, place, and time. She has normal strength. No cranial nerve deficit or sensory deficit.  Skin: Skin is warm and dry. No rash noted.  Psychiatric: She has a normal mood and affect.    ED Course   Procedures (including critical care time)  Labs Reviewed  CBC WITH DIFFERENTIAL -  Abnormal; Notable for the following:    MCV 77.8 (*)    MCH 25.9 (*)    RDW 16.4 (*)    Neutrophils Relative % 36 (*)    Lymphocytes Relative 54 (*)    All other components within normal limits  BASIC METABOLIC PANEL - Abnormal; Notable for the following:    Potassium 3.1 (*)    All other components within normal limits  URINALYSIS, ROUTINE W REFLEX MICROSCOPIC - Abnormal; Notable for the following:    Color, Urine AMBER (*)    APPearance CLOUDY (*)    Specific Gravity, Urine 1.035 (*)    Bilirubin Urine SMALL (*)    Ketones, ur 15 (*)    Protein, ur 30 (*)    Leukocytes, UA TRACE (*)    All other components within normal limits  PHENYTOIN LEVEL, TOTAL - Abnormal; Notable for the following:    Phenytoin Lvl <2.5 (*)    All other components within normal limits  URINE MICROSCOPIC-ADD ON - Abnormal; Notable for the following:    Squamous Epithelial / LPF MANY (*)    Bacteria, UA MANY (*)    All other components within normal limits  GLUCOSE, CAPILLARY   No results found. No diagnosis found.  MDM  Labs reviewed. Dilantin subtherapeutic and loaded. Care signed out to Dr. Criss Alvine pending CT.   Sherwood Castilla B. Bernette Mayers, MD 05/27/13 1623

## 2013-05-27 NOTE — ED Provider Notes (Signed)
5:53 PM patient's CT scan consistent with acute proctocolitis. Patient's pain is still not well controlled. She is otherwise not systemically ill, however will admit for pain control and hydration during her acute ulcerative colitis flare.  Audree Camel, MD 05/27/13 [phone]

## 2013-05-27 NOTE — ED Notes (Signed)
C/o generalized abd pain x 1 month, seen in ED for same a week ago. Was at MD's office today regarding abd pain & had a seizure. Pt post ictal at MD's office & A&OX4 upon EMS arrival. Hematoma to forehead.

## 2013-05-27 NOTE — ED Notes (Signed)
Admitting MD at bedside.

## 2013-05-28 ENCOUNTER — Ambulatory Visit (HOSPITAL_COMMUNITY): Payer: Medicare Other

## 2013-05-28 ENCOUNTER — Encounter (HOSPITAL_COMMUNITY): Payer: Self-pay | Admitting: General Practice

## 2013-05-28 LAB — BASIC METABOLIC PANEL
CO2: 25 mEq/L (ref 19–32)
Calcium: 8.6 mg/dL (ref 8.4–10.5)
Creatinine, Ser: 0.58 mg/dL (ref 0.50–1.10)
Glucose, Bld: 134 mg/dL — ABNORMAL HIGH (ref 70–99)

## 2013-05-28 MED ORDER — ZOLPIDEM TARTRATE 5 MG PO TABS: 5.0000 mg | ORAL_TABLET | Freq: Once | ORAL | Status: DC

## 2013-05-28 MED ORDER — MORPHINE SULFATE 2 MG/ML IJ SOLN
2.0000 mg | INTRAMUSCULAR | Status: DC | PRN
  Administered 2013-05-28 – 2013-05-30 (×5): 2 mg via INTRAVENOUS
  Filled 2013-05-28 (×5): qty 1

## 2013-05-28 MED ORDER — PHENYTOIN SODIUM EXTENDED 100 MG PO CAPS
300.0000 mg | ORAL_CAPSULE | Freq: Every day | ORAL | Status: DC
  Administered 2013-05-28: 300 mg via ORAL
  Administered 2013-05-29: 200 mg via ORAL
  Filled 2013-05-28 (×2): qty 3

## 2013-05-28 NOTE — Progress Notes (Signed)
[facility] PROGRESS NOTE  Erin Wolf ZOX:[id number] DOB: 08-19-1956 DOA: 05/27/2013 PCP: [name], MD  Brief narrative 57 year old female patient with history of ulcerative colitis, HTN, seizure disorder, hyperlipidemia, hepatitis C, migraine was admitted to the hospital on 05/27/13 for complaints of abdominal pain and mucus with bloody stools. She also had vomiting. She went to her PCPs office where she apparently had a seizure. EMS brought patient to ED. In the ED, Dilantin level was subtherapeutic and she was loaded with a gram of Dilantin, CT head was negative for acute findings, CT abdomen and pelvis showed findings consistent with proctocolitis. Hospitalist admission was requested.  Assessment/Plan: 1. Proctocolitis/rectal bleeding/abnormal CT: He will gastroenterology consulted. They suggest that she certainly has proctocolitis due to IBD and they recommend colonoscopy when she is able to tolerate a prep. In the interim, continue IV steroids. Adjust pain medications. 2. Nausea and vomiting: Unclear etiology. Status post cholecystectomy. Per GI. 3. Seizure disorder/syncope: Loaded with Dilantin on admission. Maintenance dose increased to 300 mg daily. Follow Dilantin levels in 5-7 days. Patient claims compliance to Dilantin. CT head negative for acute findings. 4. Hypertension: Hold diuretics. Monitor BP. 5. Hypokalemia: Repleted and follow BMP.  Code Status: Full Family Communication: None Disposition Plan: Home in medically stable   Consultants:  [facility]  Procedures:  None  Antibiotics:  None   HPI/Subjective: Lower abdominal pain. No nausea or vomiting since admission.  Objective: Filed Vitals:   05/27/13 1952 05/28/13 0416 05/28/13 1002 05/28/13 1627  BP: 117/74 118/64 101/69 134/83  Pulse: 99 83 89 95  Temp:  98.4 F (36.9 C) 97.5 F (36.4 C) 98.1 F (36.7 C)  TempSrc:  Oral Oral Oral  Resp: 18 18 16    Height: 5' 4.5" (1.638 m)     Weight:  105.943 kg (233 lb 9 oz)     SpO2: 100% 98% 96% 96%    Intake/Output Summary (Last 24 hours) at 05/28/13 1907 Last data filed at 05/28/13 0418  Gross per 24 hour  Intake      0 ml  Output      0 ml  Net      0 ml   Filed Weights   05/27/13 1952  Weight: 105.943 kg (233 lb 9 oz)    Exam:   General exam: Comfortable.  Respiratory system: Clear. No increased work of breathing.  Cardiovascular system: S1 & S2 heard, RRR. No JVD, murmurs, gallops, clicks or pedal edema. Telemetry: Sinus rhythm  Gastrointestinal system: Abdomen is nondistended, soft. Diffuse abdominal tenderness but especially in the lower cord rinse. However no rigidity, guarding or rebound. Normal bowel sounds heard.  Central nervous system: Alert and oriented. No focal neurological deficits.  Extremities: Symmetric 5 x 5 power.   Data Reviewed: Basic Metabolic Panel:  Recent Labs Lab 05/27/13 1411 05/27/13 2218 05/28/13 0143  NA 139  --  137  K 3.1*  --  4.3  CL 98  --  102  CO2 28  --  25  GLUCOSE 88  --  134*  BUN 13  --  10  CREATININE 0.63 0.55 0.58  CALCIUM 9.7  --  8.6   Liver Function Tests: No results found for this basename: AST, ALT, ALKPHOS, BILITOT, PROT, ALBUMIN,  in the last 168 hours No results found for this basename: LIPASE, AMYLASE,  in the last 168 hours No results found for this basename: AMMONIA,  in the last 168 hours CBC:  Recent Labs Lab 05/27/13 1411 05/27/13  2218  WBC 6.1 5.8  NEUTROABS 2.2  --   HGB 13.0 11.8*  HCT 39.0 35.7*  MCV 77.8* 77.8*  PLT 345 318   Cardiac Enzymes:  Recent Labs Lab 05/27/13 2218 05/28/13 0143  TROPONINI <0.30 <0.30   BNP (last 3 results) No results found for this basename: PROBNP,  in the last 8760 hours CBG:  Recent Labs Lab 05/27/13 1422  GLUCAP 82    No results found for this or any previous visit (from the past 240 hour(s)).   Studies: Ct Head Wo Contrast  05/27/2013   *RADIOLOGY REPORT*  Clinical Data: Seizure   CT HEAD WITHOUT CONTRAST  Technique:  Contiguous axial images were obtained from the base of the skull through the vertex without contrast.  Comparison: 01/03/2012  Findings: No evidence of parenchymal hemorrhage or extra-axial fluid collection. No mass lesion, mass effect, or midline shift.  No CT evidence of acute infarction.  Cerebral volume is age appropriate.  No ventriculomegaly.  Mucous retention cyst in the right sphenoid sinus.  The visualized paranasal sinuses are otherwise essentially clear.  The mastoid air cells are unopacified.  No evidence of calvarial fracture.  IMPRESSION: No evidence of acute intracranial abnormality.   Original Report Authenticated By: Charline Bills, M.D.   Ct Abdomen Pelvis W Contrast  05/27/2013   *RADIOLOGY REPORT*  Clinical Data: Abdominal pain.  Possible colitis.  CT ABDOMEN AND PELVIS WITH CONTRAST  Technique:  Multidetector CT imaging of the abdomen and pelvis was performed following the standard protocol during bolus administration of intravenous contrast.  Contrast: OMNIPAQUE IOHEXOL 300 MG/ML  SOLN  Comparison: CT of the abdomen and pelvis 10/21/2011.  Findings:  Lung Bases: Unremarkable.  Abdomen/Pelvis:  Status post cholecystectomy.  Mild dilatation of the common bile duct (12 mm) and mild intrahepatic biliary ductal dilatation, similar to prior MRI examinations dating back to 01/06/2012, apparently chronic in this patient.  No definite focal hepatic lesion is noted at this time.  Mild diffuse dilatation of the main pancreatic duct is also unchanged compared to prior studies measuring up to 5-6 mm in diameter.  No focal pancreatic lesions are otherwise noted.  The appearance of the spleen, bilateral adrenal glands and bilateral kidneys is unremarkable.  The transverse colon, descending colon, sigmoid colon and rectum are all essentially completely decompressed.  Even allowing for this decompression, there does appear to be some mild mural thickening which  extends from the level of the rectum up to the level of the mid transverse colon.  There is some associated mild hypervascularity throughout the sigmoid mesocolon.  A few scattered colonic diverticula are noted, without focal surrounding inflammatory changes to strongly suggest the presence of an acute diverticulitis at this time.  No significant volume of ascites.  No pneumoperitoneum.  No pathologic distension of small bowel.  No definite pathologic lymphadenopathy identified within the abdomen or pelvis on today's examination.  Status post hysterectomy. Ovaries are not confidently identified may be surgically absent or atrophic.  Urinary bladder is normal in appearance.  Musculoskeletal: There are no aggressive appearing lytic or blastic lesions noted in the visualized portions of the skeleton.  IMPRESSION: 1.  Findings, as above, compatible with a proctocolitis.  Given the contiguity of the mild wall thickening from the rectum to the midtransverse colon, and mild associated hypervascularity in the sigmoid mesocolon, correlation for signs and symptoms of ulcerative colitis may be warranted. 2.  Numerous colonic diverticula without definite signs of acute diverticulitis at this time. 3.  Status post cholecystectomy with similar mild dilatation of the intrahepatic bile ducts, common bile duct, and main pancreatic duct compared to prior MRCP 01/06/2012. 4.  Additional incidental findings, as above.   Original Report Authenticated By: Trudie Reed, M.D.     Additional labs:   Scheduled Meds: . dicyclomine  20 mg Oral BID  . enoxaparin (LOVENOX) injection  40 mg Subcutaneous Q24H  . FLUoxetine  20 mg Oral Daily  . methylPREDNISolone (SOLU-MEDROL) injection  60 mg Intravenous Q8H  . phenytoin  300 mg Oral Daily  . sodium chloride  3 mL Intravenous Q12H  . zolpidem  5 mg Oral Once   Continuous Infusions:   Active Problems:   Syncope and collapse   HTN (hypertension), benign   Seizure    Ulcerative colitis   Hypokalemia    Time spent: 40 minutes    [facility]  [facility] Pager [phone].   If 8PM-8AM, please contact night-coverage at [email or web address], password [facility] 05/28/2013, 7:07 PM  LOS: 1 day

## 2013-05-28 NOTE — Progress Notes (Signed)
When I first arrived pt was alone. Her husband joined [place] during the latter part of our visit. Pt said she is waiting for procedure after she is able to keep food down. Pt asked for prayer. Pt was grateful for prayer and visit. Marjory Lies Chaplain  05/28/13 1900  Clinical Encounter Type  Visited With Patient and family together

## 2013-05-28 NOTE — Consult Note (Signed)
[facility] CONSULT Reason for consult: nausea, rectal bleeding, history of ulcerative colitis Referring Physician: [facility]. PCP: Dr. Tiburcio Pea. Primary G.I.: none currently  Erin Wolf is an 57 y.o. female.  HPI: 57 year old who was originally diagnosed with ulcerative colitis in [place] in 1990. Since that time she has had multiple flexible sigmoidoscopy, and colonoscopies. At times her pathology has been negative for IBD and other times has been suggestive of both ulcerative colitis as well as Crohn's disease although I did not see any granulomas noted. Her disease is primarily been in the rectosigmoid area. Since being in [place], she has seen multiple gastroenterologist including Dr. Matthias Hughs, Dr. Madilyn Fireman, Dr. Loreta Ave, and Dr. Dulce Sellar. She currently has no gastroenterologist. She has been receiving Rowasa enemas from her PCP. She has a seizure disorder and apparently was a Dr. Johnathan Hausen office and passed out following a seizure. Her main G.I. complaints for nausea this is why she was unable to take her seizure medication. She's been unable to keep anything down for several days. She then began to develop worsening diarrhea with some rectal bleeding. CT scan showed her to be post cholecystectomy with thickening from her rectum to the mid transverse colon. The patient reports that she feels a little better after getting some IV medications and her nausea has improved. She has been getting both Dilantin and Solu-Medrol IV. She is in ice chips due to her nausea. Her brother died of colon cancer 5 years ago and she is quite concerned that she may have colon cancer herself.  Past Medical History  Diagnosis Date  . Hypertension   . Ulcerative colitis   . Seizure disorder   . HTN (hypertension), benign 01/03/2012  . Depression 01/03/2012  . 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 Hyperlipidemia 01/03/2012  . Fibroids 01/03/2012  . Chest pain, exertional   . SLE (systemic lupus erythematosus)    . GERD (gastroesophageal reflux disease)   . Hepatitis C     "signs of; not active" (05/28/2013)  . Migraines     "a few times/yr" (05/28/2013)  . Seizure     "today; < 1 yr ago, they just happen; started having them in my 20's" (05/28/2013)  . Chronic lower back pain   . Anxiety     Past Surgical History  Procedure Laterality Date  . Cholecystectomy  1978  . Abdominal hysterectomy  1978    "partial" (05/28/2013)  . Bilateral oophorectomy Bilateral 1987    History reviewed. No pertinent family history.  brother died of colon cancer  Social History:  reports that she has never smoked. She has never used smokeless tobacco. She reports that  drinks alcohol. She reports that she does not use illicit drugs.  Allergies:  Allergies  Allergen Reactions  . Aspirin Nausea Only  . Darvocet (Propoxyphene-Acetaminophen) Nausea And Vomiting  . Dilaudid (Hydromorphone Hcl) Itching and Nausea Only  . Isometheptene-Apap-Dichloral Nausea Only  . Penicillins Hives  . Wellbutrin (Bupropion Hcl) Other (See Comments)    Insomnia and headaches    Medications; . dicyclomine  20 mg Oral BID  . enoxaparin (LOVENOX) injection  40 mg Subcutaneous Q24H  . FLUoxetine  20 mg Oral Daily  . methylPREDNISolone (SOLU-MEDROL) injection  60 mg Intravenous Q8H  . phenytoin  300 mg Oral Daily  . sodium chloride  3 mL Intravenous Q12H  . zolpidem  5 mg Oral Once   PRN Meds acetaminophen, acetaminophen, diphenhydrAMINE, mesalamine, morphine injection, ondansetron (ZOFRAN) IV, ondansetron, oxyCODONE Results for orders placed during the  hospital encounter of 05/27/13 (from the past 48 hour(s))  CBC WITH DIFFERENTIAL     Status: Abnormal   Collection Time    05/27/13  2:11 PM      Result Value Range   WBC 6.1  4.0 - 10.5 K/uL   RBC 5.01  3.87 - 5.11 MIL/uL   Hemoglobin 13.0  12.0 - 15.0 g/dL   HCT 46.9  62.9 - 52.8 %   MCV 77.8 (*) 78.0 - 100.0 fL   MCH 25.9 (*) 26.0 - 34.0 pg   MCHC 33.3  30.0 - 36.0 g/dL    RDW 41.3 (*) 24.4 - 15.5 %   Platelets 345  150 - 400 K/uL   Neutrophils Relative % 36 (*) 43 - 77 %   Neutro Abs 2.2  1.7 - 7.7 K/uL   Lymphocytes Relative 54 (*) 12 - 46 %   Lymphs Abs 3.3  0.7 - 4.0 K/uL   Monocytes Relative 9  3 - 12 %   Monocytes Absolute 0.5  0.1 - 1.0 K/uL   Eosinophils Relative 0  0 - 5 %   Eosinophils Absolute 0.0  0.0 - 0.7 K/uL   Basophils Relative 0  0 - 1 %   Basophils Absolute 0.0  0.0 - 0.1 K/uL  BASIC METABOLIC PANEL     Status: Abnormal   Collection Time    05/27/13  2:11 PM      Result Value Range   Sodium 139  135 - 145 mEq/L   Potassium 3.1 (*) 3.5 - 5.1 mEq/L   Chloride 98  96 - 112 mEq/L   CO2 28  19 - 32 mEq/L   Glucose, Bld 88  70 - 99 mg/dL   BUN 13  6 - 23 mg/dL   Creatinine, Ser 0.10  0.50 - 1.10 mg/dL   Calcium 9.7  8.4 - 27.2 mg/dL   GFR calc non Af Amer >90  >90 mL/min   GFR calc Af Amer >90  >90 mL/min   Comment:            The eGFR has been calculated     using the CKD EPI equation.     This calculation has not been     validated in all clinical     situations.     eGFR's persistently     <90 mL/min signify     possible Chronic Kidney Disease.  PHENYTOIN LEVEL, TOTAL     Status: Abnormal   Collection Time    05/27/13  2:11 PM      Result Value Range   Phenytoin Lvl <2.5 (*) 10.0 - 20.0 ug/mL  GLUCOSE, CAPILLARY     Status: None   Collection Time    05/27/13  2:22 PM      Result Value Range   Glucose-Capillary 82  70 - 99 mg/dL  URINALYSIS, ROUTINE W REFLEX MICROSCOPIC     Status: Abnormal   Collection Time    05/27/13  3:20 PM      Result Value Range   Color, Urine AMBER (*) YELLOW   Comment: BIOCHEMICALS MAY BE AFFECTED BY COLOR   APPearance CLOUDY (*) CLEAR   Specific Gravity, Urine 1.035 (*) 1.005 - 1.030   pH 6.0  5.0 - 8.0   Glucose, UA NEGATIVE  NEGATIVE mg/dL   Hgb urine dipstick NEGATIVE  NEGATIVE   Bilirubin Urine SMALL (*) NEGATIVE   Ketones, ur 15 (*) NEGATIVE mg/dL   Protein, ur 30 (*)  NEGATIVE  mg/dL   Urobilinogen, UA 0.2  0.0 - 1.0 mg/dL   Nitrite NEGATIVE  NEGATIVE   Leukocytes, UA TRACE (*) NEGATIVE  URINE MICROSCOPIC-ADD ON     Status: Abnormal   Collection Time    05/27/13  3:20 PM      Result Value Range   Squamous Epithelial / LPF MANY (*) RARE   WBC, UA 0-2  <3 WBC/hpf   Bacteria, UA MANY (*) RARE   Urine-Other MUCOUS PRESENT    CBC     Status: Abnormal   Collection Time    05/27/13 10:18 PM      Result Value Range   WBC 5.8  4.0 - 10.5 K/uL   RBC 4.59  3.87 - 5.11 MIL/uL   Hemoglobin 11.8 (*) 12.0 - 15.0 g/dL   HCT 78.2 (*) 95.6 - 21.3 %   MCV 77.8 (*) 78.0 - 100.0 fL   MCH 25.7 (*) 26.0 - 34.0 pg   MCHC 33.1  30.0 - 36.0 g/dL   RDW 08.6 (*) 57.8 - 46.9 %   Platelets 318  150 - 400 K/uL  CREATININE, SERUM     Status: None   Collection Time    05/27/13 10:18 PM      Result Value Range   Creatinine, Ser 0.55  0.50 - 1.10 mg/dL   GFR calc non Af Amer >90  >90 mL/min   GFR calc Af Amer >90  >90 mL/min   Comment:            The eGFR has been calculated     using the CKD EPI equation.     This calculation has not been     validated in all clinical     situations.     eGFR's persistently     <90 mL/min signify     possible Chronic Kidney Disease.  TROPONIN I     Status: None   Collection Time    05/27/13 10:18 PM      Result Value Range   Troponin I <0.30  <0.30 ng/mL   Comment:            Due to the release kinetics of cTnI,     a negative result within the first hours     of the onset of symptoms does not rule out     myocardial infarction with certainty.     If myocardial infarction is still suspected,     repeat the test at appropriate intervals.  BASIC METABOLIC PANEL     Status: Abnormal   Collection Time    05/28/13  1:43 AM      Result Value Range   Sodium 137  135 - 145 mEq/L   Potassium 4.3  3.5 - 5.1 mEq/L   Comment: DELTA CHECK NOTED   Chloride 102  96 - 112 mEq/L   CO2 25  19 - 32 mEq/L   Glucose, Bld 134 (*) 70 - 99 mg/dL   BUN  10  6 - 23 mg/dL   Creatinine, Ser 6.29  0.50 - 1.10 mg/dL   Calcium 8.6  8.4 - 52.8 mg/dL   GFR calc non Af Amer >90  >90 mL/min   GFR calc Af Amer >90  >90 mL/min   Comment:            The eGFR has been calculated     using the CKD EPI equation.     This calculation has not been  validated in all clinical     situations.     eGFR's persistently     <90 mL/min signify     possible Chronic Kidney Disease.  TROPONIN I     Status: None   Collection Time    05/28/13  1:43 AM      Result Value Range   Troponin I <0.30  <0.30 ng/mL   Comment:            Due to the release kinetics of cTnI,     a negative result within the first hours     of the onset of symptoms does not rule out     myocardial infarction with certainty.     If myocardial infarction is still suspected,     repeat the test at appropriate intervals.  PHENYTOIN LEVEL, TOTAL     Status: Abnormal   Collection Time    05/28/13  1:43 AM      Result Value Range   Phenytoin Lvl 9.9 (*) 10.0 - 20.0 ug/mL    Ct Head Wo Contrast  05/27/2013   *RADIOLOGY REPORT*  Clinical Data: Seizure  CT HEAD WITHOUT CONTRAST  Technique:  Contiguous axial images were obtained from the base of the skull through the vertex without contrast.  Comparison: 01/03/2012  Findings: No evidence of parenchymal hemorrhage or extra-axial fluid collection. No mass lesion, mass effect, or midline shift.  No CT evidence of acute infarction.  Cerebral volume is age appropriate.  No ventriculomegaly.  Mucous retention cyst in the right sphenoid sinus.  The visualized paranasal sinuses are otherwise essentially clear.  The mastoid air cells are unopacified.  No evidence of calvarial fracture.  IMPRESSION: No evidence of acute intracranial abnormality.   Original Report Authenticated By: Charline Bills, M.D.   Ct Abdomen Pelvis W Contrast  05/27/2013   *RADIOLOGY REPORT*  Clinical Data: Abdominal pain.  Possible colitis.  CT ABDOMEN AND PELVIS WITH CONTRAST   Technique:  Multidetector CT imaging of the abdomen and pelvis was performed following the standard protocol during bolus administration of intravenous contrast.  Contrast: OMNIPAQUE IOHEXOL 300 MG/ML  SOLN  Comparison: CT of the abdomen and pelvis 10/21/2011.  Findings:  Lung Bases: Unremarkable.  Abdomen/Pelvis:  Status post cholecystectomy.  Mild dilatation of the common bile duct (12 mm) and mild intrahepatic biliary ductal dilatation, similar to prior MRI examinations dating back to 01/06/2012, apparently chronic in this patient.  No definite focal hepatic lesion is noted at this time.  Mild diffuse dilatation of the main pancreatic duct is also unchanged compared to prior studies measuring up to 5-6 mm in diameter.  No focal pancreatic lesions are otherwise noted.  The appearance of the spleen, bilateral adrenal glands and bilateral kidneys is unremarkable.  The transverse colon, descending colon, sigmoid colon and rectum are all essentially completely decompressed.  Even allowing for this decompression, there does appear to be some mild mural thickening which extends from the level of the rectum up to the level of the mid transverse colon.  There is some associated mild hypervascularity throughout the sigmoid mesocolon.  A few scattered colonic diverticula are noted, without focal surrounding inflammatory changes to strongly suggest the presence of an acute diverticulitis at this time.  No significant volume of ascites.  No pneumoperitoneum.  No pathologic distension of small bowel.  No definite pathologic lymphadenopathy identified within the abdomen or pelvis on today's examination.  Status post hysterectomy. Ovaries are not confidently identified may be surgically absent or  atrophic.  Urinary bladder is normal in appearance.  Musculoskeletal: There are no aggressive appearing lytic or blastic lesions noted in the visualized portions of the skeleton.  IMPRESSION: 1.  Findings, as above, compatible  with a proctocolitis.  Given the contiguity of the mild wall thickening from the rectum to the midtransverse colon, and mild associated hypervascularity in the sigmoid mesocolon, correlation for signs and symptoms of ulcerative colitis may be warranted. 2.  Numerous colonic diverticula without definite signs of acute diverticulitis at this time. 3.  Status post cholecystectomy with similar mild dilatation of the intrahepatic bile ducts, common bile duct, and main pancreatic duct compared to prior MRCP 01/06/2012. 4.  Additional incidental findings, as above.   Original Report Authenticated By: Trudie Reed, M.D.               Blood pressure 101/69, pulse 89, temperature 97.5 F (36.4 C), temperature source Oral, resp. rate 16, height 5' 4.5" (1.638 m), weight 105.943 kg (233 lb 9 oz), SpO2 96.00%.  Physical exam:   General-- tearful African-American female Heart-- regular rate and rhythm without murmurs are gallops Lungs--clear Abdomen-- none distended in soft with mild left-sided abdominal tenderness.   Assessment: 1. Nausea and vomiting. Etiology unclear. She is post cholecystectomy. She seems to be doing somewhat better here in the hospital. It's possible this could be related to migraines or her seizure disorder. 2. Rectal bleeding/abnormal CT. Almost certainly represents a proctocolitis due to IBD. When she is able to tolerate the prep, she should have another colonoscopy. In the interim would keep her on IV steroids. She likely will need to be on 5 ASA indefinitely. 3. Strong family history of colon cancer in brother. This would also warrant a colonoscopy 4. Seizure disorder. Recent seizure probably due to vomiting her seizure medications.  Plan: 1. We will go ahead and start her own clear liquids today. She is able to tolerate this, she could be prepped for colonoscopy in the next couple days. I've discussed this with her.   Lizmary Nader JR,Adajah Cocking L 05/28/2013, 3:40 PM

## 2013-05-28 NOTE — Progress Notes (Signed)
EEG not able to be done due to patients weave and patient not wanting to take out till next month.

## 2013-05-28 NOTE — Progress Notes (Signed)
INITIAL NUTRITION ASSESSMENT  DOCUMENTATION CODES Per approved criteria  -Obesity Unspecified   INTERVENTION: 1. No nutrition interventions at this time. RD will continue to assess adequacy of oral intake and need for oral nutrition supplements.   NUTRITION DIAGNOSIS: Inadequate oral intake related to inability to eat as evidenced by NPO diet.   Goal: Diet advance to meet >/=90% estimated nutrition needs  Monitor:  Po intake, weight trends, labs  Reason for Assessment: Malnutrition Screening Tool  57 y.o. female  Admitting Dx: <principal problem not specified>  ASSESSMENT: Pt seen in PCP office for abdominal pain and mucus and blood in stool. With hx of UC. While pt was waiting in PCP office she states she passed out and may have had a seizure. Pt was admitted to hospital with sub-therapeutic dilantin levels and pelvis CT showed proctocolitis.   Pt reports she has been unable to eat well for about 1 month. Thinks she has lost about 10 lbs. Weight hx shows weight is up in the past month. Currently NPO. Pt states she would like something to drink/eat but understands she needs a slow diet advance for better tolerance.  Some weight loss is desirable for this pt given obesity class 2.   Height: Ht Readings from Last 1 Encounters:  05/27/13 5' 4.5" (1.638 m)    Weight: Wt Readings from Last 1 Encounters:  05/27/13 233 lb 9 oz (105.943 kg)    Ideal Body Weight: 123 lbs   % Ideal Body Weight: 189%  Wt Readings from Last 10 Encounters:  05/27/13 233 lb 9 oz (105.943 kg)  05/16/13 220 lb (99.791 kg)  02/05/13 225 lb (102.059 kg)  01/24/13 227 lb (102.967 kg)  01/14/12 235 lb (106.595 kg)  01/07/12 233 lb 12.8 oz (106.051 kg)  10/21/11 220 lb (99.791 kg)    Usual Body Weight: weight is variable, 225-235 lbs  % Usual Body Weight: WNL  BMI:  Body mass index is 39.49 kg/(m^2). Obesity class 2  Estimated Nutritional Needs: Kcal: 2000-2200 Protein: 70-80 gm  Fluid:  2-2.2 L   Skin: Intact   Diet Order: NPO  EDUCATION NEEDS: -No education needs identified at this time   Intake/Output Summary (Last 24 hours) at 05/28/13 1051 Last data filed at 05/28/13 0418  Gross per 24 hour  Intake   1000 ml  Output      0 ml  Net   1000 ml    Last BM: PTA    Labs:   Recent Labs Lab 05/27/13 1411 05/27/13 2218 05/28/13 0143  NA 139  --  137  K 3.1*  --  4.3  CL 98  --  102  CO2 28  --  25  BUN 13  --  10  CREATININE 0.63 0.55 0.58  CALCIUM 9.7  --  8.6  GLUCOSE 88  --  134*    CBG (last 3)   Recent Labs  05/27/13 1422  GLUCAP 82    Scheduled Meds: . dicyclomine  20 mg Oral BID  . enoxaparin (LOVENOX) injection  40 mg Subcutaneous Q24H  . FLUoxetine  20 mg Oral Daily  . methylPREDNISolone (SOLU-MEDROL) injection  60 mg Intravenous Q8H  . phenytoin  300 mg Oral Daily  . sodium chloride  3 mL Intravenous Q12H  . zolpidem  5 mg Oral Once    Continuous Infusions:   Past Medical History  Diagnosis Date  . Hypertension   . Ulcerative colitis   . Seizure disorder   . HTN (  hypertension), benign 01/03/2012  . Depression 01/03/2012  . 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 Hyperlipidemia 01/03/2012  . Fibroids 01/03/2012  . Chest pain, exertional   . SLE (systemic lupus erythematosus)   . GERD (gastroesophageal reflux disease)   . Hepatitis C     "signs of; not active" (05/28/2013)  . Migraines     "a few times/yr" (05/28/2013)  . Seizure     "today; < 1 yr ago, they just happen; started having them in my 20's" (05/28/2013)  . Chronic lower back pain   . Anxiety     Past Surgical History  Procedure Laterality Date  . Cholecystectomy  1978  . Abdominal hysterectomy  1978    "partial" (05/28/2013)  . Bilateral oophorectomy Bilateral 1987    Clarene Duke RD, LDN Pager [phone] After Hours pager [phone]

## 2013-05-29 LAB — BASIC METABOLIC PANEL
CO2: 24 mEq/L (ref 19–32)
Calcium: 8.9 mg/dL (ref 8.4–10.5)
Creatinine, Ser: 0.52 mg/dL (ref 0.50–1.10)
Glucose, Bld: 118 mg/dL — ABNORMAL HIGH (ref 70–99)
Sodium: 136 mEq/L (ref 135–145)

## 2013-05-29 LAB — CBC
Hemoglobin: 11.9 g/dL — ABNORMAL LOW (ref 12.0–15.0)
MCH: 24.7 pg — ABNORMAL LOW (ref 26.0–34.0)
MCV: 78 fL (ref 78.0–100.0)
RBC: 4.82 MIL/uL (ref 3.87–5.11)

## 2013-05-29 MED ORDER — PHENYTOIN SODIUM EXTENDED 100 MG PO CAPS
200.0000 mg | ORAL_CAPSULE | Freq: Every day | ORAL | Status: DC
  Filled 2013-05-29: qty 2

## 2013-05-29 MED ORDER — PHENYTOIN SODIUM EXTENDED 100 MG PO CAPS
300.0000 mg | ORAL_CAPSULE | Freq: Every day | ORAL | Status: DC
  Administered 2013-05-29: 300 mg via ORAL
  Filled 2013-05-29 (×2): qty 3

## 2013-05-29 MED ORDER — METHYLPREDNISOLONE SODIUM SUCC 40 MG IJ SOLR
30.0000 mg | Freq: Three times a day (TID) | INTRAMUSCULAR | Status: DC
  Administered 2013-05-29 – 2013-05-31 (×6): 30 mg via INTRAVENOUS
  Filled 2013-05-29 (×8): qty 0.75

## 2013-05-29 MED ORDER — LEVETIRACETAM ER 500 MG PO TB24
1000.0000 mg | ORAL_TABLET | Freq: Every day | ORAL | Status: DC
  Administered 2013-05-29 – 2013-05-31 (×3): 1000 mg via ORAL
  Filled 2013-05-29 (×3): qty 2

## 2013-05-29 NOTE — Progress Notes (Signed)
We will plan colonoscopy on Monday provided it's ok with primary team and her Dilantin level is ok. Patient doing ok today.

## 2013-05-29 NOTE — Progress Notes (Signed)
MEDICATION RELATED CONSULT NOTE - DILANTIN   Erin Wolf is a 57 yo F on Dilantin PTA for hx of seizures.  Pt has had undetectable levels of Dilantin (as far back as 2008) despite reports of medication compliance and dose increases by primary care physician.  I spoke with patient about her seizure history and she reports 1-2 seizures per year with the last one 8 months prior to this one which resulted in her hospital admission.  I reviewed her medications and did not identify any drug interactions that would contribute to her chronic low dilantin levels.  I suspect with her GI inflammation/colitis that she is not absorbing the dilantin.  I have communicated this information to Dr. Waymon Amato and we have agreed to increase her dose to 200mg  in the AM and 300mg  PM.  Neurology consult ordered as well.  [employer], Pharm.D., BCPS Clinical Pharmacist Pager [phone] 05/29/2013 11:02 AM

## 2013-05-29 NOTE — Consult Note (Signed)
NEURO HOSPITALIST CONSULT NOTE    Reason for Consult: seizures with persistent sub-therapeutic dilantin level. Concern poor dilantin absorption.   HPI:                                                                                                                                          Erin Wolf is an 57 y.o. female with a past medical history significant for HTN, hyperlipidemia, ulcerative colitis, dysthymic disorder, depression, hepatitis C, episodic migraine, and seizures since year 2000, admitted to [facility] 8/7  after sustaining a probable seizure at her PCP office. She was vomiting and having abdominal pain with bloody stools and went to see her PCP and she said that while waiting in a room she was was told that she passed out.  EMS arrive to the scene and found her confused. Upon arrival to the ED she had a dilantin level < 2.5 and thus was loaded with 1 gram IV dilantin. No further seizures. CT brain showed no acute abnormality. Dilantin level 9.9 Erin Wolf indicated that she started having seizures in 2000, but her seizures are infrequent and no cause had been identified. Stated that her last seizure before this admission was probably 1 year ago and at that time had low dilantin level. She tells me that she is fully adherent to anti-seizure medication. She reports " a metallic taste" before her habitual seizures. Her husband had witnessed many of her seizures and he told her that she becomes unresponsive, starts clinching her teeth, foaming, and then the whole body gets stiff for couple of minutes. Afterwards, she is confused and amnestic for the event. Denies ever having exclusively the the metallic taste. In addition, she denies side effects on dilantin even after increasing the dose from 200 to 400 mg daily (increased because of persistently low dilantin levels). No history of febrile seizures, CNS infections, severe head injury, or stroke. She was born full term,  product of a normal pregnancy and delivery. No neonatal/perinatal complications. Normal development.  Past Medical History  Diagnosis Date  . Hypertension   . Ulcerative colitis   . Seizure disorder   . HTN (hypertension), benign 01/03/2012  . Depression 01/03/2012  . 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 Hyperlipidemia 01/03/2012  . Fibroids 01/03/2012  . Chest pain, exertional   . SLE (systemic lupus erythematosus)   . GERD (gastroesophageal reflux disease)   . Hepatitis C     "signs of; not active" (05/28/2013)  . Migraines     "a few times/yr" (05/28/2013)  . Seizure     "today; < 1 yr ago, they just happen; started having them in my 20's" (05/28/2013)  . Chronic lower back pain   . Anxiety     Past Surgical History  Procedure Laterality Date  .  Cholecystectomy  1978  . Abdominal hysterectomy  1978    "partial" (05/28/2013)  . Bilateral oophorectomy Bilateral 1987    History reviewed. No pertinent family history.    Social History:  reports that she has never smoked. She has never used smokeless tobacco. She reports that  drinks alcohol. She reports that she does not use illicit drugs.  Allergies  Allergen Reactions  . Aspirin Nausea Only  . Darvocet (Propoxyphene-Acetaminophen) Nausea And Vomiting  . Dilaudid (Hydromorphone Hcl) Itching and Nausea Only  . Isometheptene-Apap-Dichloral Nausea Only  . Penicillins Hives  . Wellbutrin (Bupropion Hcl) Other (See Comments)    Insomnia and headaches    MEDICATIONS:                                                                                                                     I have reviewed the patient's current medications.   ROS:                                                                                                                                       History obtained from the patient and chart review.  General ROS: negative for - chills, fatigue, fever, night sweats, weight gain or weight loss Psychological ROS:  negative for - behavioral disorder, hallucinations, memory difficulties, mood swings or suicidal ideation Ophthalmic ROS: negative for - blurry vision, double vision, eye pain or loss of vision ENT ROS: negative for - epistaxis, nasal discharge, oral lesions, sore throat, tinnitus or vertigo Allergy and Immunology ROS: negative for - hives or itchy/watery eyes Hematological and Lymphatic ROS: negative for - bleeding problems, bruising or swollen lymph nodes Endocrine ROS: negative for - galactorrhea, hair pattern changes, polydipsia/polyuria or temperature intolerance Respiratory ROS: negative for - cough, hemoptysis, shortness of breath or wheezing Cardiovascular ROS: negative for - chest pain, dyspnea on exertion, edema or irregular heartbeat Gastrointestinal ROS: significant for - abdominal pain, diarrhea Genito-Urinary ROS: negative for - dysuria, hematuria, incontinence or urinary frequency/urgency Musculoskeletal ROS: negative for - joint swelling or muscular weakness Neurological ROS: as noted in HPI Dermatological ROS: negative for rash and skin lesion changes      Physical exam: pleasant female in no apparent distress. Blood pressure 140/65, pulse 81, temperature 97.2 F (36.2 C), temperature source Oral, resp. rate 16, height 5' 4.5" (1.638 m), weight 105.943 kg (233 lb 9 oz), SpO2 99.00%. Head: normocephalic. Neck: supple,  no bruits, no JVD. Cardiac: no murmurs. Lungs: clear. Abdomen: soft, no tender, no mass. Extremities: no edema.    Neurologic Examination:                                                                                                      Mental Status: Alert, oriented, thought content appropriate.  Speech fluent without evidence of aphasia.  Able to follow 3 step commands without difficulty. Cranial Nerves: II: Discs flat bilaterally; Visual fields grossly normal, pupils equal, round, reactive to light and accommodation III,IV, VI: ptosis not present,  extra-ocular motions intact bilaterally V,VII: smile symmetric, facial light touch sensation normal bilaterally VIII: hearing normal bilaterally IX,X: gag reflex present XI: bilateral shoulder shrug XII: midline tongue extension Motor: Right : Upper extremity   5/5    Left:     Upper extremity   5/5  Lower extremity   5/5     Lower extremity   5/5 Tone and bulk:normal tone throughout; no atrophy noted Sensory: Pinprick and light touch intact throughout, bilaterally Deep Tendon Reflexes:  2+ all over  Plantars: Right: downgoing   Left: downgoing Cerebellar: normal finger-to-nose,  normal heel-to-shin test Gait: No ataxia. CV: pulses palpable throughout    Lab Results  Component Value Date/Time   CHOL 196 01/04/2012  5:00 AM    Results for orders placed during the hospital encounter of 05/27/13 (from the past 48 hour(s))  CBC WITH DIFFERENTIAL     Status: Abnormal   Collection Time    05/27/13  2:11 PM      Result Value Range   WBC 6.1  4.0 - 10.5 K/uL   RBC 5.01  3.87 - 5.11 MIL/uL   Hemoglobin 13.0  12.0 - 15.0 g/dL   HCT 46.9  62.9 - 52.8 %   MCV 77.8 (*) 78.0 - 100.0 fL   MCH 25.9 (*) 26.0 - 34.0 pg   MCHC 33.3  30.0 - 36.0 g/dL   RDW 41.3 (*) 24.4 - 01.0 %   Platelets 345  150 - 400 K/uL   Neutrophils Relative % 36 (*) 43 - 77 %   Neutro Abs 2.2  1.7 - 7.7 K/uL   Lymphocytes Relative 54 (*) 12 - 46 %   Lymphs Abs 3.3  0.7 - 4.0 K/uL   Monocytes Relative 9  3 - 12 %   Monocytes Absolute 0.5  0.1 - 1.0 K/uL   Eosinophils Relative 0  0 - 5 %   Eosinophils Absolute 0.0  0.0 - 0.7 K/uL   Basophils Relative 0  0 - 1 %   Basophils Absolute 0.0  0.0 - 0.1 K/uL  BASIC METABOLIC PANEL     Status: Abnormal   Collection Time    05/27/13  2:11 PM      Result Value Range   Sodium 139  135 - 145 mEq/L   Potassium 3.1 (*) 3.5 - 5.1 mEq/L   Chloride 98  96 - 112 mEq/L   CO2 28  19 - 32 mEq/L   Glucose, Bld 88  70 -  99 mg/dL   BUN 13  6 - 23 mg/dL   Creatinine, Ser 1.61   0.50 - 1.10 mg/dL   Calcium 9.7  8.4 - 09.6 mg/dL   GFR calc non Af Amer >90  >90 mL/min   GFR calc Af Amer >90  >90 mL/min   Comment:            The eGFR has been calculated     using the CKD EPI equation.     This calculation has not been     validated in all clinical     situations.     eGFR's persistently     <90 mL/min signify     possible Chronic Kidney Disease.  PHENYTOIN LEVEL, TOTAL     Status: Abnormal   Collection Time    05/27/13  2:11 PM      Result Value Range   Phenytoin Lvl <2.5 (*) 10.0 - 20.0 ug/mL  GLUCOSE, CAPILLARY     Status: None   Collection Time    05/27/13  2:22 PM      Result Value Range   Glucose-Capillary 82  70 - 99 mg/dL  URINALYSIS, ROUTINE W REFLEX MICROSCOPIC     Status: Abnormal   Collection Time    05/27/13  3:20 PM      Result Value Range   Color, Urine AMBER (*) YELLOW   Comment: BIOCHEMICALS MAY BE AFFECTED BY COLOR   APPearance CLOUDY (*) CLEAR   Specific Gravity, Urine 1.035 (*) 1.005 - 1.030   pH 6.0  5.0 - 8.0   Glucose, UA NEGATIVE  NEGATIVE mg/dL   Hgb urine dipstick NEGATIVE  NEGATIVE   Bilirubin Urine SMALL (*) NEGATIVE   Ketones, ur 15 (*) NEGATIVE mg/dL   Protein, ur 30 (*) NEGATIVE mg/dL   Urobilinogen, UA 0.2  0.0 - 1.0 mg/dL   Nitrite NEGATIVE  NEGATIVE   Leukocytes, UA TRACE (*) NEGATIVE  URINE MICROSCOPIC-ADD ON     Status: Abnormal   Collection Time    05/27/13  3:20 PM      Result Value Range   Squamous Epithelial / LPF MANY (*) RARE   WBC, UA 0-2  <3 WBC/hpf   Bacteria, UA MANY (*) RARE   Urine-Other MUCOUS PRESENT    CBC     Status: Abnormal   Collection Time    05/27/13 10:18 PM      Result Value Range   WBC 5.8  4.0 - 10.5 K/uL   RBC 4.59  3.87 - 5.11 MIL/uL   Hemoglobin 11.8 (*) 12.0 - 15.0 g/dL   HCT 04.5 (*) 40.9 - 81.1 %   MCV 77.8 (*) 78.0 - 100.0 fL   MCH 25.7 (*) 26.0 - 34.0 pg   MCHC 33.1  30.0 - 36.0 g/dL   RDW 91.4 (*) 78.2 - 95.6 %   Platelets 318  150 - 400 K/uL  CREATININE, SERUM      Status: None   Collection Time    05/27/13 10:18 PM      Result Value Range   Creatinine, Ser 0.55  0.50 - 1.10 mg/dL   GFR calc non Af Amer >90  >90 mL/min   GFR calc Af Amer >90  >90 mL/min   Comment:            The eGFR has been calculated     using the CKD EPI equation.     This calculation has not been     validated  in all clinical     situations.     eGFR's persistently     <90 mL/min signify     possible Chronic Kidney Disease.  TROPONIN I     Status: None   Collection Time    05/27/13 10:18 PM      Result Value Range   Troponin I <0.30  <0.30 ng/mL   Comment:            Due to the release kinetics of cTnI,     a negative result within the first hours     of the onset of symptoms does not rule out     myocardial infarction with certainty.     If myocardial infarction is still suspected,     repeat the test at appropriate intervals.  BASIC METABOLIC PANEL     Status: Abnormal   Collection Time    05/28/13  1:43 AM      Result Value Range   Sodium 137  135 - 145 mEq/L   Potassium 4.3  3.5 - 5.1 mEq/L   Comment: DELTA CHECK NOTED   Chloride 102  96 - 112 mEq/L   CO2 25  19 - 32 mEq/L   Glucose, Bld 134 (*) 70 - 99 mg/dL   BUN 10  6 - 23 mg/dL   Creatinine, Ser 1.61  0.50 - 1.10 mg/dL   Calcium 8.6  8.4 - 09.6 mg/dL   GFR calc non Af Amer >90  >90 mL/min   GFR calc Af Amer >90  >90 mL/min   Comment:            The eGFR has been calculated     using the CKD EPI equation.     This calculation has not been     validated in all clinical     situations.     eGFR's persistently     <90 mL/min signify     possible Chronic Kidney Disease.  TROPONIN I     Status: None   Collection Time    05/28/13  1:43 AM      Result Value Range   Troponin I <0.30  <0.30 ng/mL   Comment:            Due to the release kinetics of cTnI,     a negative result within the first hours     of the onset of symptoms does not rule out     myocardial infarction with certainty.     If  myocardial infarction is still suspected,     repeat the test at appropriate intervals.  PHENYTOIN LEVEL, TOTAL     Status: Abnormal   Collection Time    05/28/13  1:43 AM      Result Value Range   Phenytoin Lvl 9.9 (*) 10.0 - 20.0 ug/mL  CBC     Status: Abnormal   Collection Time    05/29/13  5:00 AM      Result Value Range   WBC 6.6  4.0 - 10.5 K/uL   RBC 4.82  3.87 - 5.11 MIL/uL   Hemoglobin 11.9 (*) 12.0 - 15.0 g/dL   HCT 04.5  40.9 - 81.1 %   MCV 78.0  78.0 - 100.0 fL   MCH 24.7 (*) 26.0 - 34.0 pg   MCHC 31.6  30.0 - 36.0 g/dL   RDW 91.4 (*) 78.2 - 95.6 %   Platelets 351  150 - 400 K/uL  BASIC METABOLIC PANEL  Status: Abnormal   Collection Time    05/29/13  5:00 AM      Result Value Range   Sodium 136  135 - 145 mEq/L   Potassium 4.1  3.5 - 5.1 mEq/L   Chloride 101  96 - 112 mEq/L   CO2 24  19 - 32 mEq/L   Glucose, Bld 118 (*) 70 - 99 mg/dL   BUN 9  6 - 23 mg/dL   Creatinine, Ser 1.61  0.50 - 1.10 mg/dL   Calcium 8.9  8.4 - 09.6 mg/dL   GFR calc non Af Amer >90  >90 mL/min   GFR calc Af Amer >90  >90 mL/min   Comment:            The eGFR has been calculated     using the CKD EPI equation.     This calculation has not been     validated in all clinical     situations.     eGFR's persistently     <90 mL/min signify     possible Chronic Kidney Disease.    Ct Head Wo Contrast  05/27/2013   *RADIOLOGY REPORT*  Clinical Data: Seizure  CT HEAD WITHOUT CONTRAST  Technique:  Contiguous axial images were obtained from the base of the skull through the vertex without contrast.  Comparison: 01/03/2012  Findings: No evidence of parenchymal hemorrhage or extra-axial fluid collection. No mass lesion, mass effect, or midline shift.  No CT evidence of acute infarction.  Cerebral volume is age appropriate.  No ventriculomegaly.  Mucous retention cyst in the right sphenoid sinus.  The visualized paranasal sinuses are otherwise essentially clear.  The mastoid air cells are  unopacified.  No evidence of calvarial fracture.  IMPRESSION: No evidence of acute intracranial abnormality.   Original Report Authenticated By: Charline Bills, M.D.   Ct Abdomen Pelvis W Contrast  05/27/2013   *RADIOLOGY REPORT*  Clinical Data: Abdominal pain.  Possible colitis.  CT ABDOMEN AND PELVIS WITH CONTRAST  Technique:  Multidetector CT imaging of the abdomen and pelvis was performed following the standard protocol during bolus administration of intravenous contrast.  Contrast: OMNIPAQUE IOHEXOL 300 MG/ML  SOLN  Comparison: CT of the abdomen and pelvis 10/21/2011.  Findings:  Lung Bases: Unremarkable.  Abdomen/Pelvis:  Status post cholecystectomy.  Mild dilatation of the common bile duct (12 mm) and mild intrahepatic biliary ductal dilatation, similar to prior MRI examinations dating back to 01/06/2012, apparently chronic in this patient.  No definite focal hepatic lesion is noted at this time.  Mild diffuse dilatation of the main pancreatic duct is also unchanged compared to prior studies measuring up to 5-6 mm in diameter.  No focal pancreatic lesions are otherwise noted.  The appearance of the spleen, bilateral adrenal glands and bilateral kidneys is unremarkable.  The transverse colon, descending colon, sigmoid colon and rectum are all essentially completely decompressed.  Even allowing for this decompression, there does appear to be some mild mural thickening which extends from the level of the rectum up to the level of the mid transverse colon.  There is some associated mild hypervascularity throughout the sigmoid mesocolon.  A few scattered colonic diverticula are noted, without focal surrounding inflammatory changes to strongly suggest the presence of an acute diverticulitis at this time.  No significant volume of ascites.  No pneumoperitoneum.  No pathologic distension of small bowel.  No definite pathologic lymphadenopathy identified within the abdomen or pelvis on today's examination.   Status post hysterectomy.  Ovaries are not confidently identified may be surgically absent or atrophic.  Urinary bladder is normal in appearance.  Musculoskeletal: There are no aggressive appearing lytic or blastic lesions noted in the visualized portions of the skeleton.  IMPRESSION: 1.  Findings, as above, compatible with a proctocolitis.  Given the contiguity of the mild wall thickening from the rectum to the midtransverse colon, and mild associated hypervascularity in the sigmoid mesocolon, correlation for signs and symptoms of ulcerative colitis may be warranted. 2.  Numerous colonic diverticula without definite signs of acute diverticulitis at this time. 3.  Status post cholecystectomy with similar mild dilatation of the intrahepatic bile ducts, common bile duct, and main pancreatic duct compared to prior MRCP 01/06/2012. 4.  Additional incidental findings, as above.   Original Report Authenticated By: Trudie Reed, M.D.     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 Normal neuro-exam and unremarkable CT brain. She seems to be very adherent to treatment and has been on dilantin since year 2000 with seizures in the context of sub-therapeutic levels despite increasing the daily maintenance dose of dilantin to 400 mg daily. Agree that poor absorption in the setting of ulcerative colitis could be playing a significant role, as she is not on polypharmacy to explain significant pharmacological interaction. She is young and her epilepsy syndrome seems to be more suitable for utilizing a new generation AED.  Mrs. Nine has underlying mood disorder and Lamotrigine will be a very good alternative to dilantin, but unfortunately getting therapeutic on lamotrigine will take 3-4 weeks. Therefore, will suggest switching to Keppra with close monitoring of her mood disorder which can  be worsened by this drug. She can start keppra 500 mg BID for 5 days, then 750 mg thereafter and gradually start weaning off dilantin over 1 week (300 mg x 3 days, 200 mg x 2 days, 100 mg x 2 days and then D/C dilantin).  Wyatt Portela, MD   [facility] [phone]  05/29/2013, 1:28 PM

## 2013-05-29 NOTE — Progress Notes (Signed)
[facility] PROGRESS NOTE  Erin Wolf UEA:[id number] DOB: 18-Sep-1956 DOA: 05/27/2013 PCP: [name], MD  Brief narrative 57 year old female patient with history of ulcerative colitis, HTN, seizure disorder, hyperlipidemia, hepatitis C, migraine was admitted to the hospital on 05/27/13 for complaints of abdominal pain and mucus with bloody stools. She also had vomiting. She went to her PCPs office where she apparently had a seizure. EMS brought patient to ED. In the ED, Dilantin level was subtherapeutic and she was loaded with a gram of Dilantin, CT head was negative for acute findings, CT abdomen and pelvis showed findings consistent with proctocolitis. Hospitalist admission was requested.  Assessment/Plan: 1. Proctocolitis/rectal bleeding/abnormal CT: gastroenterology consulted. They suggest that she certainly has proctocolitis due to IBD. Continue IV steroids. Improving-no nausea or vomiting. No BMs. Abdominal pain controlled. GI plans colonoscopy on 8/11. 2. Nausea and vomiting: Unclear etiology. Status post cholecystectomy. Seems to have resolved. Continue liquid diet. 3. Seizure disorder/syncope: Patient claims compliance to Dilantin. CT head negative for acute findings. Last seizure prior to that on admission was 8 months ago. Patient states that she was compliant to Dilantin 200 mg by mouth twice a day. It appears that her Dilantin levels are subtherapeutic, probably secondary to absorption issues related to ulcerative colitis and from low albumin. Increased Dilantin to 200 mg in a.m. and 300 mg at bedtime. Neurology consulted to see if there are any alternate options such as changing to Keppra. If stays on Dilantin, recommend repeating Dilantin levels in a week's time. Patient was unable to perform EEG secondary to hair extensions which cannot be removed at this time. 4. Hypertension: Hold diuretics. Monitor BP. 5. Hypokalemia: Repleted 6. Anemia: Stable. Follow CBC in  a.m.  Code Status: Full Family Communication: Discussed with daughter at bedside Disposition Plan: Home when medically stable   Consultants:  [facility]  Neurology  Procedures:  None  Antibiotics:  None   HPI/Subjective: No nausea or vomiting. Tolerating liquid diet. No BM. Abdominal pain better.  Objective: Filed Vitals:   05/28/13 1627 05/28/13 2040 05/29/13 0554 05/29/13 0745  BP: 134/83 122/69 133/62 140/65  Pulse: 95 95 78 81  Temp: 98.1 F (36.7 C) 97.6 F (36.4 C) 97.5 F (36.4 C) 97.2 F (36.2 C)  TempSrc: Oral Oral Oral Oral  Resp:  16 16 16   Height:  5' 4.5" (1.638 m)    Weight:  105.943 kg (233 lb 9 oz)    SpO2: 96% 98% 99% 99%    Intake/Output Summary (Last 24 hours) at 05/29/13 1313 Last data filed at 05/29/13 0602  Gross per 24 hour  Intake    240 ml  Output      0 ml  Net    240 ml   Filed Weights   05/27/13 1952 05/28/13 2040  Weight: 105.943 kg (233 lb 9 oz) 105.943 kg (233 lb 9 oz)    Exam:   General exam: Comfortable.  Respiratory system: Clear. No increased work of breathing.  Cardiovascular system: S1 & S2 heard, RRR. No JVD, murmurs, gallops, clicks or pedal edema. Telemetry: Sinus rhythm  Gastrointestinal system: Abdomen is nondistended, soft. Diffuse abdominal tenderness-mild and definitely better than 8/8. However no rigidity, guarding or rebound. Normal bowel sounds heard.  Central nervous system: Alert and oriented. No focal neurological deficits.  Extremities: Symmetric 5 x 5 power.   Data Reviewed: Basic Metabolic Panel:  Recent Labs Lab 05/27/13 1411 05/27/13 2218 05/28/13 0143 05/29/13 0500  NA 139  --  137 136  K 3.1*  --  4.3 4.1  CL 98  --  102 101  CO2 28  --  25 24  GLUCOSE 88  --  134* 118*  BUN 13  --  10 9  CREATININE 0.63 0.55 0.58 0.52  CALCIUM 9.7  --  8.6 8.9   Liver Function Tests: No results found for this basename: AST, ALT, ALKPHOS, BILITOT, PROT, ALBUMIN,  in the last 168 hours No  results found for this basename: LIPASE, AMYLASE,  in the last 168 hours No results found for this basename: AMMONIA,  in the last 168 hours CBC:  Recent Labs Lab 05/27/13 1411 05/27/13 2218 05/29/13 0500  WBC 6.1 5.8 6.6  NEUTROABS 2.2  --   --   HGB 13.0 11.8* 11.9*  HCT 39.0 35.7* 37.6  MCV 77.8* 77.8* 78.0  PLT 345 318 351   Cardiac Enzymes:  Recent Labs Lab 05/27/13 2218 05/28/13 0143  TROPONINI <0.30 <0.30   BNP (last 3 results) No results found for this basename: PROBNP,  in the last 8760 hours CBG:  Recent Labs Lab 05/27/13 1422  GLUCAP 82    No results found for this or any previous visit (from the past 240 hour(s)).   Studies: Ct Head Wo Contrast  05/27/2013   *RADIOLOGY REPORT*  Clinical Data: Seizure  CT HEAD WITHOUT CONTRAST  Technique:  Contiguous axial images were obtained from the base of the skull through the vertex without contrast.  Comparison: 01/03/2012  Findings: No evidence of parenchymal hemorrhage or extra-axial fluid collection. No mass lesion, mass effect, or midline shift.  No CT evidence of acute infarction.  Cerebral volume is age appropriate.  No ventriculomegaly.  Mucous retention cyst in the right sphenoid sinus.  The visualized paranasal sinuses are otherwise essentially clear.  The mastoid air cells are unopacified.  No evidence of calvarial fracture.  IMPRESSION: No evidence of acute intracranial abnormality.   Original Report Authenticated By: Charline Bills, M.D.   Ct Abdomen Pelvis W Contrast  05/27/2013   *RADIOLOGY REPORT*  Clinical Data: Abdominal pain.  Possible colitis.  CT ABDOMEN AND PELVIS WITH CONTRAST  Technique:  Multidetector CT imaging of the abdomen and pelvis was performed following the standard protocol during bolus administration of intravenous contrast.  Contrast: OMNIPAQUE IOHEXOL 300 MG/ML  SOLN  Comparison: CT of the abdomen and pelvis 10/21/2011.  Findings:  Lung Bases: Unremarkable.  Abdomen/Pelvis:  Status  post cholecystectomy.  Mild dilatation of the common bile duct (12 mm) and mild intrahepatic biliary ductal dilatation, similar to prior MRI examinations dating back to 01/06/2012, apparently chronic in this patient.  No definite focal hepatic lesion is noted at this time.  Mild diffuse dilatation of the main pancreatic duct is also unchanged compared to prior studies measuring up to 5-6 mm in diameter.  No focal pancreatic lesions are otherwise noted.  The appearance of the spleen, bilateral adrenal glands and bilateral kidneys is unremarkable.  The transverse colon, descending colon, sigmoid colon and rectum are all essentially completely decompressed.  Even allowing for this decompression, there does appear to be some mild mural thickening which extends from the level of the rectum up to the level of the mid transverse colon.  There is some associated mild hypervascularity throughout the sigmoid mesocolon.  A few scattered colonic diverticula are noted, without focal surrounding inflammatory changes to strongly suggest the presence of an acute diverticulitis at this time.  No significant volume of ascites.  No pneumoperitoneum.  No  pathologic distension of small bowel.  No definite pathologic lymphadenopathy identified within the abdomen or pelvis on today's examination.  Status post hysterectomy. Ovaries are not confidently identified may be surgically absent or atrophic.  Urinary bladder is normal in appearance.  Musculoskeletal: There are no aggressive appearing lytic or blastic lesions noted in the visualized portions of the skeleton.  IMPRESSION: 1.  Findings, as above, compatible with a proctocolitis.  Given the contiguity of the mild wall thickening from the rectum to the midtransverse colon, and mild associated hypervascularity in the sigmoid mesocolon, correlation for signs and symptoms of ulcerative colitis may be warranted. 2.  Numerous colonic diverticula without definite signs of acute diverticulitis  at this time. 3.  Status post cholecystectomy with similar mild dilatation of the intrahepatic bile ducts, common bile duct, and main pancreatic duct compared to prior MRCP 01/06/2012. 4.  Additional incidental findings, as above.   Original Report Authenticated By: Trudie Reed, M.D.     Additional labs:   Scheduled Meds: . dicyclomine  20 mg Oral BID  . enoxaparin (LOVENOX) injection  40 mg Subcutaneous Q24H  . FLUoxetine  20 mg Oral Daily  . methylPREDNISolone (SOLU-MEDROL) injection  60 mg Intravenous Q8H  . [START ON 05/30/2013] phenytoin  200 mg Oral Daily  . phenytoin  300 mg Oral QHS  . sodium chloride  3 mL Intravenous Q12H  . zolpidem  5 mg Oral Once   Continuous Infusions:   Active Problems:   Syncope and collapse   HTN (hypertension), benign   Seizure   Ulcerative colitis   Hypokalemia    Time spent: 20 minutes    [facility]  [facility] Pager [phone].   If 8PM-8AM, please contact night-coverage at [email or web address], password [facility] 05/29/2013, 1:13 PM  LOS: 2 days

## 2013-05-30 LAB — CBC
HCT: 39.3 % (ref 36.0–46.0)
MCH: 25.2 pg — ABNORMAL LOW (ref 26.0–34.0)
MCV: 77.4 fL — ABNORMAL LOW (ref 78.0–100.0)
Platelets: 298 10*3/uL (ref 150–400)
RBC: 5.08 MIL/uL (ref 3.87–5.11)

## 2013-05-30 MED ORDER — PHENYTOIN SODIUM EXTENDED 100 MG PO CAPS: 200.0000 mg | ORAL_CAPSULE | Freq: Every day | ORAL | Status: DC

## 2013-05-30 MED ORDER — PHENYTOIN SODIUM EXTENDED 100 MG PO CAPS
300.0000 mg | ORAL_CAPSULE | Freq: Every day | ORAL | Status: DC
  Administered 2013-05-30: 300 mg via ORAL
  Filled 2013-05-30 (×2): qty 3

## 2013-05-30 MED ORDER — PEG 3350-KCL-NA BICARB-NACL 420 G PO SOLR
4000.0000 mL | Freq: Once | ORAL | Status: AC
  Administered 2013-05-30: 4000 mL via ORAL
  Filled 2013-05-30: qty 4000

## 2013-05-30 MED ORDER — PHENYTOIN SODIUM EXTENDED 100 MG PO CAPS: 100.0000 mg | ORAL_CAPSULE | Freq: Every day | ORAL | Status: DC

## 2013-05-30 NOTE — Progress Notes (Signed)
No complaints today. Plan colonoscopy tomorrow.

## 2013-05-30 NOTE — Progress Notes (Signed)
[facility] PROGRESS NOTE  Erin Wolf NWG:[id number] DOB: 22-May-1956 DOA: 05/27/2013 PCP: [name], MD  Brief narrative 57 year old female Wolf with history of ulcerative colitis, HTN, seizure disorder, hyperlipidemia, hepatitis C, migraine was admitted to the hospital on 05/27/13 for complaints of abdominal pain and mucus with bloody stools. She also had vomiting. She went to her PCPs office where she apparently had a seizure. EMS brought Wolf to ED. In the ED, Dilantin level was subtherapeutic and she was loaded with a gram of Dilantin, CT head was negative for acute findings, CT abdomen and pelvis showed findings consistent with proctocolitis. Hospitalist admission was requested.  Assessment/Plan: 1. Proctocolitis/rectal bleeding/abnormal CT: gastroenterology consulted. They suggest that she certainly has proctocolitis due to IBD. Continue IV steroids. GI plans colonoscopy on 8/11. Improved-no nausea or vomiting, tolerating regular diet, decreased abdominal pain and no BM or rectal bleeding. 2. Nausea and vomiting: Unclear etiology. Status post cholecystectomy. Resolved.  3. Seizure disorder/syncope: Wolf claims compliance to Dilantin. CT head negative for acute findings. Last seizure prior to that on admission was 8 months ago. Wolf states that she was compliant to Dilantin 200 mg by mouth twice a day. It appears that her Dilantin levels are subtherapeutic, probably secondary to absorption issues related to ulcerative colitis and from low albumin. Increased Dilantin to 200 mg in a.m. and 300 mg at bedtime. Wolf was unable to perform EEG secondary to hair extensions which cannot be removed at this time. Neurology consultation appreciated-started Keppra on 8/9 and recommend tapering the Dilantin to discontinue over 5 days. Discussed with Dr. Georga Hacking, Neurology-no further interventions from a seizure standpoint necessary preprocedure/colonoscopy and okay to  proceed. 4. Hypertension: Hold diuretics. Monitor BP. 5. Hypokalemia: Repleted 6. Anemia: Stable.   Code Status: Full Family Communication: None Disposition Plan: Home possibly 8/11 pending colonoscopy.   Consultants:  [facility]urology  Procedures:  None  Antibiotics:  None   HPI/Subjective: No nausea or vomiting. Tolerated regular diet. Abdominal pain has significantly decreased. No BM or rectal bleeding.  Objective: Filed Vitals:   05/30/13 0545 05/30/13 0548 05/30/13 0552 05/30/13 0755  BP: 116/73 125/79 123/81 140/86  Pulse: 82 75 74 80  Temp: 97.6 F (36.4 C)   98.7 F (37.1 C)  TempSrc: Oral   Oral  Resp: 18 18 20 20   Height:      Weight:      SpO2: 100% 99% 100% 100%    Intake/Output Summary (Last 24 hours) at 05/30/13 1208 Last data filed at 05/30/13 0900  Gross per 24 hour  Intake    980 ml  Output      1 ml  Net    979 ml   Filed Weights   05/27/13 1952 05/28/13 2040 05/29/13 2215  Weight: 105.943 kg (233 lb 9 oz) 105.943 kg (233 lb 9 oz) 104.9 kg (231 lb 4.2 oz)    Exam:   General exam: Comfortable.  Respiratory system: Clear. No increased work of breathing.  Cardiovascular system: S1 & S2 heard, RRR. No JVD, murmurs, gallops, clicks or pedal edema. Telemetry: Sinus rhythm (Will DC telemetry 8/10)  Gastrointestinal system: Abdomen is nondistended, soft. Mild lower abdominal tenderness-mild and definitely better than 8/8. However no rigidity, guarding or rebound. Normal bowel sounds heard.  Central nervous system: Alert and oriented. No focal neurological deficits.  Extremities: Symmetric 5 x 5 power.   Data Reviewed: Basic Metabolic Panel:  Recent Labs Lab 05/27/13 1411 05/27/13 2218 05/28/13 0143 05/29/13 0500  NA  139  --  137 136  K 3.1*  --  4.3 4.1  CL 98  --  102 101  CO2 28  --  25 24  GLUCOSE 88  --  134* 118*  BUN 13  --  10 9  CREATININE 0.63 0.55 0.58 0.52  CALCIUM 9.7  --  8.6 8.9   Liver Function  Tests: No results found for this basename: AST, ALT, ALKPHOS, BILITOT, PROT, ALBUMIN,  in the last 168 hours No results found for this basename: LIPASE, AMYLASE,  in the last 168 hours No results found for this basename: AMMONIA,  in the last 168 hours CBC:  Recent Labs Lab 05/27/13 1411 05/27/13 2218 05/29/13 0500 05/30/13 0447  WBC 6.1 5.8 6.6 7.2  NEUTROABS 2.2  --   --   --   HGB 13.0 11.8* 11.9* 12.8  HCT 39.0 35.7* 37.6 39.3  MCV 77.8* 77.8* 78.0 77.4*  PLT 345 318 351 298   Cardiac Enzymes:  Recent Labs Lab 05/27/13 2218 05/28/13 0143  TROPONINI <0.30 <0.30   BNP (last 3 results) No results found for this basename: PROBNP,  in the last 8760 hours CBG:  Recent Labs Lab 05/27/13 1422  GLUCAP 82    No results found for this or any previous visit (from the past 240 hour(s)).   Studies: No results found.   Additional labs:   Scheduled Meds: . dicyclomine  20 mg Oral BID  . enoxaparin (LOVENOX) injection  40 mg Subcutaneous Q24H  . FLUoxetine  20 mg Oral Daily  . levETIRAcetam  1,000 mg Oral Daily  . methylPREDNISolone (SOLU-MEDROL) injection  30 mg Intravenous Q8H  . phenytoin  300 mg Oral QHS   Followed by  . [START ON 06/02/2013] phenytoin  200 mg Oral QHS   Followed by  . [START ON 06/04/2013] phenytoin  100 mg Oral QHS  . sodium chloride  3 mL Intravenous Q12H  . zolpidem  5 mg Oral Once   Continuous Infusions:   Active Problems:   Syncope and collapse   HTN (hypertension), benign   Seizure   Ulcerative colitis   Hypokalemia    Time spent: 20 minutes    [facility]  [facility] Pager [phone].   If 8PM-8AM, please contact night-coverage at [email or web address], password [facility] 05/30/2013, 12:08 PM  LOS: 3 days

## 2013-05-31 ENCOUNTER — Encounter (HOSPITAL_COMMUNITY)
Admission: EM | Disposition: A | Discharge status: Home / Self Care | Payer: Self-pay | Source: Home / Self Care | Attending: Internal Medicine

## 2013-05-31 ENCOUNTER — Encounter (HOSPITAL_COMMUNITY): Payer: Self-pay

## 2013-05-31 HISTORY — PX: COLONOSCOPY: SHX5424

## 2013-05-31 SURGERY — COLONOSCOPY
Anesthesia: Moderate Sedation

## 2013-05-31 MED ORDER — FENTANYL CITRATE 0.05 MG/ML IJ SOLN
INTRAMUSCULAR | Status: DC | PRN
  Administered 2013-05-31 (×5): 25 ug via INTRAVENOUS

## 2013-05-31 MED ORDER — SODIUM CHLORIDE 0.9 % IV SOLN: INTRAVENOUS | Status: DC

## 2013-05-31 MED ORDER — PHENYTOIN SODIUM EXTENDED 100 MG PO CAPS: ORAL_CAPSULE | ORAL | Status: DC

## 2013-05-31 MED ORDER — LEVETIRACETAM ER 500 MG PO TB24: 1000.0000 mg | ORAL_TABLET | Freq: Every day | ORAL | Status: DC

## 2013-05-31 MED ORDER — DICYCLOMINE HCL 20 MG PO TABS: 20.0000 mg | ORAL_TABLET | Freq: Two times a day (BID) | ORAL | Status: DC

## 2013-05-31 MED ORDER — MIDAZOLAM HCL 5 MG/ML IJ SOLN
INTRAMUSCULAR | Status: AC
  Filled 2013-05-31: qty 2

## 2013-05-31 MED ORDER — MIDAZOLAM HCL 5 MG/5ML IJ SOLN
INTRAMUSCULAR | Status: DC | PRN
  Administered 2013-05-31 (×2): 2 mg via INTRAVENOUS
  Administered 2013-05-31 (×2): 1 mg via INTRAVENOUS
  Administered 2013-05-31 (×2): 2 mg via INTRAVENOUS

## 2013-05-31 MED ORDER — FENTANYL CITRATE 0.05 MG/ML IJ SOLN
INTRAMUSCULAR | Status: AC
  Filled 2013-05-31: qty 4

## 2013-05-31 NOTE — Discharge Summary (Signed)
Pt d/c'd to home with husband and belongings. Voices no complaints. Iv d/c'd. D/s summary with pt. Voices understanding of all instructions. Pt to make f/u. Prescriptions called into pharmacy.

## 2013-05-31 NOTE — Progress Notes (Signed)
   CARE MANAGEMENT NOTE 05/31/2013  Patient:  Erin Wolf, Erin Wolf   Account Number:  [id number]  Date Initiated:  05/28/2013  Documentation initiated by:  Darlyne Russian  Subjective/Objective Assessment:   Patient admitted with ulcerative colitis flare, bloody stools,vomiting unable to take oral meds,  passed out at MD office, ? seizure, Dilantin level sub therapeutic.     Action/Plan:   Progression of care and discharge planning   Anticipated DC Date:  05/31/2013   Anticipated DC Plan:  HOME/SELF CARE         Choice offered to / List presented to:             Status of service:  Completed, signed off Medicare Important Message given?   (If response is "NO", the following Medicare IM given date fields will be blank) Date Medicare IM given:   Date Additional Medicare IM given:    Discharge Disposition:  HOME/SELF CARE  Per UR Regulation:  Reviewed for med. necessity/level of care/duration of stay  If discussed at Long Length of Stay Meetings, dates discussed:    Comments:  05/31/2013  1130 Darlyne Russian RN, [place]  [phone] Met with patient to discuss planned discharge for today. She said her ride can pick her up at 4:00 pm today. Advised her the discharge is for today and she has to leave today. She verbalized understanding.

## 2013-05-31 NOTE — Op Note (Signed)
[name] [address] [place], [address]   COLONOSCOPY PROCEDURE REPORT  PATIENT: Erin Wolf, Erin Wolf  MR#: [id number] BIRTHDATE: 31-Dec-1955 , 56  yrs. old GENDER: Female ENDOSCOPIST: Wandalee Ferdinand, MD REFERRED BY: PROCEDURE DATE:  05/31/2013 PROCEDURE:   colonoscopy with biopsy ASA CLASS:   3 INDICATIONS:chronic ulcerative colitis, rectal bleeding MEDICATIONS: fentanyl 125 mcg IV, Versed 10 mg IV  DESCRIPTION OF PROCEDURE:   After the risks benefits and alternatives of the procedure were thoroughly explained, informed consent was obtained.  digital rectal exam was normal        The Pentax Ped Colon [id number]  endoscope was introduced through the anus and advanced to the cecum     . No adverse events experienced. The quality of the prep was  good       The instrument was then slowly withdrawn as the colon was fully examined.  The cecum and ascending colon looked normal, and the transverse colon looked normal. The descending colon looked normal. The sigmoid showed an occasional diverticulum. The rectum looked normal. On retroflexion some small internal hemorrhoids were seen. I did not see any evidence of active colitis on this examination. There was no evidence of tumor on this examination. The colitis visually appears not to be active at this time. Random biopsies were obtained from the right colon transverse colon and left colon for histological inspection.    The time to cecum=  .  Withdrawal time=  .  The scope was withdrawn and the procedure completed. COMPLICATIONS: There were no complications.  ENDOSCOPIC IMPRESSION:see above  RECOMMENDATIONS:at this point I do not see an indication for continued use of IV steroids. I will go ahead and advance her to a regular diet. if she tolerates her diet I think she can home.   eSigned:  Wandalee Ferdinand, MD 05/31/2013 9:48 AM   cc:

## 2013-05-31 NOTE — Discharge Summary (Addendum)
Physician Discharge Summary  HARMONY SANDELL AVW:[id number] DOB: 1956-05-14 DOA: 05/27/2013  PCP: [name], MD  Admit date: 05/27/2013 Discharge date: 05/31/2013  Time spent: Greater than 30 minutes  Recommendations for Outpatient Follow-up:  1. Dr. [name], PCP in 1 week. 2. Follow up colon biopsy results as outpatient. 3. Dr. Herbert Moors, GI  Discharge Diagnoses:  Active Problems:   Syncope and collapse   HTN (hypertension), benign   Seizure   Ulcerative colitis   Hypokalemia   Discharge Condition: Improved & Stable  Diet recommendation: Heart Healthy diet.  Filed Weights   05/28/13 2040 05/29/13 2215 05/30/13 2110  Weight: 105.943 kg (233 lb 9 oz) 104.9 kg (231 lb 4.2 oz) 106.006 kg (233 lb 11.2 oz)    History of present illness:  57 year old female patient with history of ulcerative colitis, HTN, seizure disorder, hyperlipidemia, hepatitis C, migraine was admitted to the hospital on 05/27/13 for complaints of abdominal pain and mucus with bloody stools. She also had vomiting. She went to her PCPs office where she apparently had a seizure. EMS brought patient to ED. In the ED, Dilantin level was subtherapeutic and she was loaded with a gram of Dilantin, CT head was negative for acute findings, CT abdomen and pelvis showed findings consistent with proctocolitis. Hospitalist admission was requested.  Hospital Course:   Proctocolitis/rectal bleeding/abnormal CT: gastroenterology consulted. They suggest that she certainly has proctocolitis due to IBD on CT. She was started on IV Solu-Medrol. Patient underwent colonoscopy today-results as below. Gastroenterology recommends discontinuing steroids and discharge. Patient has no further rectal bleeding, nausea or vomiting. She has intermittent mild lower abdominal discomfort of an clear etiology.  Nausea and vomiting: Unclear etiology. Status post cholecystectomy. Resolved.   Seizure disorder/syncope: Patient claims  compliance to Dilantin. CT head negative for acute findings. Last seizure prior to that on admission was 8 months ago. Patient states that she was compliant to Dilantin 200 mg by mouth twice a day. It appears that her Dilantin levels are subtherapeutic, probably secondary to absorption issues related to ulcerative colitis and from low albumin. Patient was unable to perform EEG secondary to hair extensions which cannot be removed at this time. Neurology consulted-started Keppra on 8/9 and recommend tapering the Dilantin to discontinue over 7 days.   Hypertension: Diuretics were held in the hospital, but will be resumed at discharge. Controlled.  Hypokalemia: Repleted   Anemia: Stable.  Procedures:  Colonoscopy  DESCRIPTION OF PROCEDURE: After the risks benefits and alternatives of the procedure were thoroughly explained, informed consent was obtained. digital rectal exam was normal The Pentax Ped Colon [id number] endoscope was introduced through the anus and advanced to the cecum . No adverse events experienced. The quality of the prep was good The instrument was then slowly withdrawn as the colon was fully examined.   The cecum and ascending colon looked normal, and the transverse colon looked normal. The descending colon looked normal. The sigmoid showed an occasional diverticulum. The rectum looked normal. On retroflexion some small internal hemorrhoids were seen. I did not see any evidence of active colitis on this examination. There was no evidence of tumor on this examination. The colitis visually appears not to be active at this time. Random biopsies  were obtained from the right colon transverse colon and left colon for histological inspection.   The scope was withdrawn and the procedure completed.   COMPLICATIONS: There were no complications.   ENDOSCOPIC IMPRESSION:see above   RECOMMENDATIONS:at this point he did not see  an indication for continued use of IV steroids.    Consultations:  [facility]  Discharge Exam:  Complaints: No further seizures in the hospital. No nausea, vomiting or rectal bleeding. Intermittent mild lower abdominal discomfort. Tolerating diet.  Filed Vitals:   05/31/13 0949 05/31/13 0954 05/31/13 1000 05/31/13 1016  BP: 138/86 138/86 142/83 121/73  Pulse:  67 67 85  Temp: 97.6 F (36.4 C)   98.7 F (37.1 C)  TempSrc: Oral   Oral  Resp: 12 18 16 16   Height:      Weight:      SpO2: 100% 100% 99% 100%    General exam: Comfortable.  Respiratory system: Clear. No increased work of breathing.  Cardiovascular system: S1 & S2 heard, RRR. No JVD, murmurs, gallops, clicks or pedal edema.   Gastrointestinal system: Abdomen is nondistended, soft and nontender. Normal bowel sounds heard.  Central nervous system: Alert and oriented. No focal neurological deficits.  Extremities: Symmetric 5 x 5 power.   Discharge Instructions      Discharge Orders   Future Orders Complete By Expires     Call MD for:  extreme fatigue  As directed     Call MD for:  persistant dizziness or light-headedness  As directed     Call MD for:  persistant nausea and vomiting  As directed     Call MD for:  severe uncontrolled pain  As directed     Call MD for:  As directed     Comments:      Rectal bleeding or seizures.    Diet - low sodium heart healthy  As directed     Increase activity slowly  As directed         Medication List         acetaminophen 500 MG tablet  Commonly known as:  TYLENOL  Take 1,000 mg by mouth daily as needed for pain.     dicyclomine 20 MG tablet  Commonly known as:  BENTYL  Take 1 tablet (20 mg total) by mouth 2 (two) times daily.     FLUoxetine 20 MG capsule  Commonly known as:  PROZAC  Take 20 mg by mouth every morning.     levETIRAcetam 500 MG 24 hr tablet  Commonly known as:  KEPPRA XR  Take 2 tablets (1,000 mg total) by mouth daily.     MAXZIDE-25 37.5-25 MG per tablet  Generic drug:   triamterene-hydrochlorothiazide  Take 1 tablet by mouth every morning.     mesalamine 4 G enema  Commonly known as:  ROWASA  Place 4 g rectally at bedtime as needed (constiaption). For inflammatory bowel disease     phenytoin 100 MG ER capsule  Commonly known as:  DILANTIN  Take 3 capsules daily x2 days, then 2 capsules daily x2 days, then 1 capsule daily x2 days, then stop.     promethazine 25 MG suppository  Commonly known as:  PHENERGAN  Place 1 suppository (25 mg total) rectally every 6 (six) hours as needed for nausea.       Follow-up Information   Follow up with [name], MD. Schedule an appointment as soon as possible for a visit in 1 week.   Contact information:   [facility] [address] [place] [address] [phone]       Schedule an appointment as soon as possible for a visit with Graylin Shiver, MD.   Contact information:   [address],  [place] [address] [phone]        The results of significant diagnostics from this hospitalization (including imaging, microbiology, ancillary and laboratory) are listed below for reference.    Significant Diagnostic Studies: Ct Head Wo Contrast  05/27/2013   *RADIOLOGY REPORT*  Clinical Data: Seizure  CT HEAD WITHOUT CONTRAST  Technique:  Contiguous axial images were obtained from the base of the skull through the vertex without contrast.  Comparison: 01/03/2012  Findings: No evidence of parenchymal hemorrhage or extra-axial fluid collection. No mass lesion, mass effect, or midline shift.  No CT evidence of acute infarction.  Cerebral volume is age appropriate.  No ventriculomegaly.  Mucous retention cyst in the right sphenoid sinus.  The visualized paranasal sinuses are otherwise essentially clear.  The mastoid air cells are unopacified.  No evidence of calvarial fracture.  IMPRESSION: No evidence of acute intracranial abnormality.   Original Report Authenticated  By: Charline Bills, M.D.   Ct Abdomen Pelvis W Contrast  05/27/2013   *RADIOLOGY REPORT*  Clinical Data: Abdominal pain.  Possible colitis.  CT ABDOMEN AND PELVIS WITH CONTRAST  Technique:  Multidetector CT imaging of the abdomen and pelvis was performed following the standard protocol during bolus administration of intravenous contrast.  Contrast: OMNIPAQUE IOHEXOL 300 MG/ML  SOLN  Comparison: CT of the abdomen and pelvis 10/21/2011.  Findings:  Lung Bases: Unremarkable.  Abdomen/Pelvis:  Status post cholecystectomy.  Mild dilatation of the common bile duct (12 mm) and mild intrahepatic biliary ductal dilatation, similar to prior MRI examinations dating back to 01/06/2012, apparently chronic in this patient.  No definite focal hepatic lesion is noted at this time.  Mild diffuse dilatation of the main pancreatic duct is also unchanged compared to prior studies measuring up to 5-6 mm in diameter.  No focal pancreatic lesions are otherwise noted.  The appearance of the spleen, bilateral adrenal glands and bilateral kidneys is unremarkable.  The transverse colon, descending colon, sigmoid colon and rectum are all essentially completely decompressed.  Even allowing for this decompression, there does appear to be some mild mural thickening which extends from the level of the rectum up to the level of the mid transverse colon.  There is some associated mild hypervascularity throughout the sigmoid mesocolon.  A few scattered colonic diverticula are noted, without focal surrounding inflammatory changes to strongly suggest the presence of an acute diverticulitis at this time.  No significant volume of ascites.  No pneumoperitoneum.  No pathologic distension of small bowel.  No definite pathologic lymphadenopathy identified within the abdomen or pelvis on today's examination.  Status post hysterectomy. Ovaries are not confidently identified may be surgically absent or atrophic.  Urinary bladder is normal in  appearance.  Musculoskeletal: There are no aggressive appearing lytic or blastic lesions noted in the visualized portions of the skeleton.  IMPRESSION: 1.  Findings, as above, compatible with a proctocolitis.  Given the contiguity of the mild wall thickening from the rectum to the midtransverse colon, and mild associated hypervascularity in the sigmoid mesocolon, correlation for signs and symptoms of ulcerative colitis may be warranted. 2.  Numerous colonic diverticula without definite signs of acute diverticulitis at this time. 3.  Status post cholecystectomy with similar mild dilatation of the intrahepatic bile ducts, common bile duct, and main pancreatic duct compared to prior MRCP 01/06/2012. 4.  Additional incidental findings, as above.   Original Report Authenticated By: Trudie Reed, M.D.    Microbiology: No results found for this or any previous visit (  from the past 240 hour(s)).   Labs: Basic Metabolic Panel:  Recent Labs Lab 05/27/13 1411 05/27/13 2218 05/28/13 0143 05/29/13 0500  NA 139  --  137 136  K 3.1*  --  4.3 4.1  CL 98  --  102 101  CO2 28  --  25 24  GLUCOSE 88  --  134* 118*  BUN 13  --  10 9  CREATININE 0.63 0.55 0.58 0.52  CALCIUM 9.7  --  8.6 8.9   Liver Function Tests: No results found for this basename: AST, ALT, ALKPHOS, BILITOT, PROT, ALBUMIN,  in the last 168 hours No results found for this basename: LIPASE, AMYLASE,  in the last 168 hours No results found for this basename: AMMONIA,  in the last 168 hours CBC:  Recent Labs Lab 05/27/13 1411 05/27/13 2218 05/29/13 0500 05/30/13 0447  WBC 6.1 5.8 6.6 7.2  NEUTROABS 2.2  --   --   --   HGB 13.0 11.8* 11.9* 12.8  HCT 39.0 35.7* 37.6 39.3  MCV 77.8* 77.8* 78.0 77.4*  PLT 345 318 351 298   Cardiac Enzymes:  Recent Labs Lab 05/27/13 2218 05/28/13 0143  TROPONINI <0.30 <0.30   BNP: BNP (last 3 results) No results found for this basename: PROBNP,  in the last 8760 hours CBG:  Recent  Labs Lab 05/27/13 1422  GLUCAP 82    Additional labs:  Admitting Dilantin level: < 2.5. Came to 9.9 after Dilantin load.   SignedMarcellus Scott  [facility] 05/31/2013, 3:36 PM

## 2013-06-01 ENCOUNTER — Encounter (HOSPITAL_COMMUNITY): Payer: Self-pay | Admitting: Gastroenterology

## 2013-06-17 DIAGNOSIS — R569 Unspecified convulsions: Secondary | ICD-10-CM | POA: Diagnosis not present

## 2013-06-17 DIAGNOSIS — K5190 Ulcerative colitis, unspecified, without complications: Secondary | ICD-10-CM | POA: Diagnosis not present

## 2013-06-17 DIAGNOSIS — I10 Essential (primary) hypertension: Secondary | ICD-10-CM | POA: Diagnosis not present

## 2013-06-17 DIAGNOSIS — G43909 Migraine, unspecified, not intractable, without status migrainosus: Secondary | ICD-10-CM | POA: Diagnosis not present

## 2013-06-29 DIAGNOSIS — K5289 Other specified noninfective gastroenteritis and colitis: Secondary | ICD-10-CM | POA: Diagnosis not present

## 2013-07-21 DIAGNOSIS — F411 Generalized anxiety disorder: Secondary | ICD-10-CM | POA: Diagnosis not present

## 2013-07-21 DIAGNOSIS — R51 Headache: Secondary | ICD-10-CM | POA: Diagnosis not present

## 2013-08-04 ENCOUNTER — Ambulatory Visit: Payer: Medicare Other | Admitting: Neurology

## 2013-08-26 ENCOUNTER — Other Ambulatory Visit: Payer: Self-pay

## 2013-09-13 DIAGNOSIS — F329 Major depressive disorder, single episode, unspecified: Secondary | ICD-10-CM | POA: Diagnosis not present

## 2013-09-13 DIAGNOSIS — F411 Generalized anxiety disorder: Secondary | ICD-10-CM | POA: Diagnosis not present

## 2013-09-13 DIAGNOSIS — K5190 Ulcerative colitis, unspecified, without complications: Secondary | ICD-10-CM | POA: Diagnosis not present

## 2013-09-13 DIAGNOSIS — R112 Nausea with vomiting, unspecified: Secondary | ICD-10-CM | POA: Diagnosis not present

## 2013-09-28 DIAGNOSIS — H2510 Age-related nuclear cataract, unspecified eye: Secondary | ICD-10-CM | POA: Diagnosis not present

## 2013-09-28 DIAGNOSIS — H04129 Dry eye syndrome of unspecified lacrimal gland: Secondary | ICD-10-CM | POA: Diagnosis not present

## 2013-10-19 DIAGNOSIS — R51 Headache: Secondary | ICD-10-CM | POA: Diagnosis not present

## 2013-10-19 DIAGNOSIS — B9789 Other viral agents as the cause of diseases classified elsewhere: Secondary | ICD-10-CM | POA: Diagnosis not present

## 2013-10-19 DIAGNOSIS — J40 Bronchitis, not specified as acute or chronic: Secondary | ICD-10-CM | POA: Diagnosis not present

## 2013-11-02 DIAGNOSIS — R5383 Other fatigue: Secondary | ICD-10-CM | POA: Diagnosis not present

## 2013-11-02 DIAGNOSIS — M545 Low back pain, unspecified: Secondary | ICD-10-CM | POA: Diagnosis not present

## 2013-11-02 DIAGNOSIS — B182 Chronic viral hepatitis C: Secondary | ICD-10-CM | POA: Diagnosis not present

## 2013-11-02 DIAGNOSIS — M2550 Pain in unspecified joint: Secondary | ICD-10-CM | POA: Diagnosis not present

## 2013-11-02 DIAGNOSIS — R894 Abnormal immunological findings in specimens from other organs, systems and tissues: Secondary | ICD-10-CM | POA: Diagnosis not present

## 2013-11-02 DIAGNOSIS — R5381 Other malaise: Secondary | ICD-10-CM | POA: Diagnosis not present

## 2013-11-02 DIAGNOSIS — IMO0001 Reserved for inherently not codable concepts without codable children: Secondary | ICD-10-CM | POA: Diagnosis not present

## 2013-11-09 DIAGNOSIS — R1031 Right lower quadrant pain: Secondary | ICD-10-CM | POA: Diagnosis not present

## 2013-11-09 DIAGNOSIS — R1032 Left lower quadrant pain: Secondary | ICD-10-CM | POA: Diagnosis not present

## 2013-11-09 DIAGNOSIS — R110 Nausea: Secondary | ICD-10-CM | POA: Diagnosis not present

## 2013-11-09 DIAGNOSIS — R197 Diarrhea, unspecified: Secondary | ICD-10-CM | POA: Diagnosis not present

## 2013-11-09 DIAGNOSIS — K5190 Ulcerative colitis, unspecified, without complications: Secondary | ICD-10-CM | POA: Diagnosis not present

## 2013-11-09 DIAGNOSIS — R109 Unspecified abdominal pain: Secondary | ICD-10-CM | POA: Diagnosis not present

## 2013-11-24 DIAGNOSIS — Z01419 Encounter for gynecological examination (general) (routine) without abnormal findings: Secondary | ICD-10-CM | POA: Diagnosis not present

## 2013-11-24 DIAGNOSIS — Z1231 Encounter for screening mammogram for malignant neoplasm of breast: Secondary | ICD-10-CM | POA: Diagnosis not present

## 2013-11-24 DIAGNOSIS — Z130 Encounter for screening for diseases of the blood and blood-forming organs and certain disorders involving the immune mechanism: Secondary | ICD-10-CM | POA: Diagnosis not present

## 2013-11-26 ENCOUNTER — Other Ambulatory Visit: Payer: Self-pay | Admitting: Dermatology

## 2013-11-26 DIAGNOSIS — L259 Unspecified contact dermatitis, unspecified cause: Secondary | ICD-10-CM | POA: Diagnosis not present

## 2013-11-26 DIAGNOSIS — L988 Other specified disorders of the skin and subcutaneous tissue: Secondary | ICD-10-CM | POA: Diagnosis not present

## 2013-11-30 ENCOUNTER — Other Ambulatory Visit: Payer: Self-pay | Admitting: Obstetrics and Gynecology

## 2013-11-30 DIAGNOSIS — R928 Other abnormal and inconclusive findings on diagnostic imaging of breast: Secondary | ICD-10-CM

## 2013-12-06 ENCOUNTER — Ambulatory Visit
Admission: RE | Admit: 2013-12-06 | Discharge: 2013-12-06 | Disposition: A | Discharge status: Home / Self Care | Payer: Medicare Other | Source: Ambulatory Visit | Attending: Obstetrics and Gynecology | Admitting: Obstetrics and Gynecology

## 2013-12-06 ENCOUNTER — Other Ambulatory Visit: Payer: Medicare Other

## 2013-12-06 DIAGNOSIS — N644 Mastodynia: Secondary | ICD-10-CM | POA: Diagnosis not present

## 2013-12-06 DIAGNOSIS — R928 Other abnormal and inconclusive findings on diagnostic imaging of breast: Secondary | ICD-10-CM

## 2013-12-09 DIAGNOSIS — E785 Hyperlipidemia, unspecified: Secondary | ICD-10-CM | POA: Diagnosis not present

## 2013-12-09 DIAGNOSIS — R110 Nausea: Secondary | ICD-10-CM | POA: Diagnosis not present

## 2013-12-09 DIAGNOSIS — K5190 Ulcerative colitis, unspecified, without complications: Secondary | ICD-10-CM | POA: Diagnosis not present

## 2013-12-09 DIAGNOSIS — F411 Generalized anxiety disorder: Secondary | ICD-10-CM | POA: Diagnosis not present

## 2014-01-04 ENCOUNTER — Encounter (HOSPITAL_COMMUNITY): Payer: Self-pay | Admitting: Emergency Medicine

## 2014-01-04 ENCOUNTER — Emergency Department (HOSPITAL_COMMUNITY): Payer: Medicare Other

## 2014-01-04 ENCOUNTER — Emergency Department (HOSPITAL_COMMUNITY)
Admission: EM | Admit: 2014-01-04 | Discharge: 2014-01-04 | Disposition: A | Discharge status: Home / Self Care | Payer: Medicare Other | Attending: Emergency Medicine | Admitting: Emergency Medicine

## 2014-01-04 DIAGNOSIS — Z9079 Acquired absence of other genital organ(s): Secondary | ICD-10-CM | POA: Diagnosis not present

## 2014-01-04 DIAGNOSIS — F341 Dysthymic disorder: Secondary | ICD-10-CM | POA: Diagnosis not present

## 2014-01-04 DIAGNOSIS — Z8742 Personal history of other diseases of the female genital tract: Secondary | ICD-10-CM | POA: Insufficient documentation

## 2014-01-04 DIAGNOSIS — Z8719 Personal history of other diseases of the digestive system: Secondary | ICD-10-CM | POA: Diagnosis not present

## 2014-01-04 DIAGNOSIS — R0602 Shortness of breath: Secondary | ICD-10-CM | POA: Insufficient documentation

## 2014-01-04 DIAGNOSIS — Z862 Personal history of diseases of the blood and blood-forming organs and certain disorders involving the immune mechanism: Secondary | ICD-10-CM | POA: Insufficient documentation

## 2014-01-04 DIAGNOSIS — R079 Chest pain, unspecified: Secondary | ICD-10-CM

## 2014-01-04 DIAGNOSIS — Z880 Allergy status to penicillin: Secondary | ICD-10-CM | POA: Diagnosis not present

## 2014-01-04 DIAGNOSIS — R112 Nausea with vomiting, unspecified: Secondary | ICD-10-CM | POA: Diagnosis not present

## 2014-01-04 DIAGNOSIS — Z9089 Acquired absence of other organs: Secondary | ICD-10-CM | POA: Insufficient documentation

## 2014-01-04 DIAGNOSIS — Z8739 Personal history of other diseases of the musculoskeletal system and connective tissue: Secondary | ICD-10-CM | POA: Insufficient documentation

## 2014-01-04 DIAGNOSIS — Z9889 Other specified postprocedural states: Secondary | ICD-10-CM | POA: Diagnosis not present

## 2014-01-04 DIAGNOSIS — I10 Essential (primary) hypertension: Secondary | ICD-10-CM | POA: Insufficient documentation

## 2014-01-04 DIAGNOSIS — Z90710 Acquired absence of both cervix and uterus: Secondary | ICD-10-CM | POA: Insufficient documentation

## 2014-01-04 DIAGNOSIS — R072 Precordial pain: Secondary | ICD-10-CM | POA: Insufficient documentation

## 2014-01-04 DIAGNOSIS — Z8619 Personal history of other infectious and parasitic diseases: Secondary | ICD-10-CM | POA: Insufficient documentation

## 2014-01-04 DIAGNOSIS — Z79899 Other long term (current) drug therapy: Secondary | ICD-10-CM | POA: Insufficient documentation

## 2014-01-04 DIAGNOSIS — R109 Unspecified abdominal pain: Secondary | ICD-10-CM | POA: Insufficient documentation

## 2014-01-04 DIAGNOSIS — R197 Diarrhea, unspecified: Secondary | ICD-10-CM | POA: Diagnosis not present

## 2014-01-04 DIAGNOSIS — G40909 Epilepsy, unspecified, not intractable, without status epilepticus: Secondary | ICD-10-CM | POA: Diagnosis not present

## 2014-01-04 DIAGNOSIS — Z8639 Personal history of other endocrine, nutritional and metabolic disease: Secondary | ICD-10-CM | POA: Insufficient documentation

## 2014-01-04 DIAGNOSIS — G8929 Other chronic pain: Secondary | ICD-10-CM | POA: Insufficient documentation

## 2014-01-04 LAB — CBC WITH DIFFERENTIAL/PLATELET
BASOS ABS: 0 10*3/uL (ref 0.0–0.1)
BASOS PCT: 0 % (ref 0–1)
EOS ABS: 0 10*3/uL (ref 0.0–0.7)
EOS PCT: 0 % (ref 0–5)
HCT: 39.8 % (ref 36.0–46.0)
Hemoglobin: 12.9 g/dL (ref 12.0–15.0)
LYMPHS ABS: 2.8 10*3/uL (ref 0.7–4.0)
Lymphocytes Relative: 60 % — ABNORMAL HIGH (ref 12–46)
MCH: 25.4 pg — ABNORMAL LOW (ref 26.0–34.0)
MCHC: 32.4 g/dL (ref 30.0–36.0)
MCV: 78.3 fL (ref 78.0–100.0)
Monocytes Absolute: 0.4 10*3/uL (ref 0.1–1.0)
Monocytes Relative: 9 % (ref 3–12)
NEUTROS PCT: 31 % — AB (ref 43–77)
Neutro Abs: 1.4 10*3/uL — ABNORMAL LOW (ref 1.7–7.7)
Platelets: 326 10*3/uL (ref 150–400)
RBC: 5.08 MIL/uL (ref 3.87–5.11)
RDW: 16.4 % — AB (ref 11.5–15.5)
WBC: 4.7 10*3/uL (ref 4.0–10.5)

## 2014-01-04 LAB — LIPASE, BLOOD: Lipase: 25 U/L (ref 11–59)

## 2014-01-04 LAB — COMPREHENSIVE METABOLIC PANEL
ALBUMIN: 3.9 g/dL (ref 3.5–5.2)
ALK PHOS: 52 U/L (ref 39–117)
ALT: 20 U/L (ref 0–35)
AST: 20 U/L (ref 0–37)
BUN: 10 mg/dL (ref 6–23)
CALCIUM: 9.8 mg/dL (ref 8.4–10.5)
CO2: 27 mEq/L (ref 19–32)
Chloride: 97 mEq/L (ref 96–112)
Creatinine, Ser: 0.55 mg/dL (ref 0.50–1.10)
GFR calc Af Amer: >90 mL/min (ref >90)
GFR calc non Af Amer: >90 mL/min (ref >90)
GLUCOSE: 91 mg/dL (ref 70–99)
POTASSIUM: 3.8 meq/L (ref 3.7–5.3)
SODIUM: 139 meq/L (ref 137–147)
TOTAL PROTEIN: 8 g/dL (ref 6.0–8.3)
Total Bilirubin: 0.3 mg/dL (ref 0.3–1.2)

## 2014-01-04 LAB — D-DIMER, QUANTITATIVE: D-Dimer, Quant: <0.27 ug/mL-FEU (ref 0.00–0.48)

## 2014-01-04 MED ORDER — MORPHINE SULFATE 4 MG/ML IJ SOLN
6.0000 mg | Freq: Once | INTRAMUSCULAR | Status: DC
  Filled 2014-01-04: qty 2

## 2014-01-04 MED ORDER — ONDANSETRON 4 MG PO TBDP
8.0000 mg | ORAL_TABLET | Freq: Once | ORAL | Status: AC
  Administered 2014-01-04: 8 mg via ORAL
  Filled 2014-01-04: qty 2

## 2014-01-04 MED ORDER — METOCLOPRAMIDE HCL 5 MG/ML IJ SOLN
10.0000 mg | Freq: Once | INTRAMUSCULAR | Status: AC
  Administered 2014-01-04: 10 mg via INTRAVENOUS
  Filled 2014-01-04: qty 2

## 2014-01-04 MED ORDER — ONDANSETRON 8 MG PO TBDP: 8.0000 mg | ORAL_TABLET | Freq: Three times a day (TID) | ORAL | Status: DC | PRN

## 2014-01-04 MED ORDER — SODIUM CHLORIDE 0.9 % IV SOLN
1000.0000 mL | Freq: Once | INTRAVENOUS | Status: AC
  Administered 2014-01-04: 1000 mL via INTRAVENOUS

## 2014-01-04 MED ORDER — HYDROMORPHONE HCL PF 1 MG/ML IJ SOLN
1.0000 mg | Freq: Once | INTRAMUSCULAR | Status: AC
  Administered 2014-01-04: 1 mg via INTRAMUSCULAR
  Filled 2014-01-04: qty 1

## 2014-01-04 MED ORDER — ONDANSETRON HCL 4 MG/2ML IJ SOLN
4.0000 mg | Freq: Once | INTRAMUSCULAR | Status: DC
  Filled 2014-01-04: qty 2

## 2014-01-04 MED ORDER — SODIUM CHLORIDE 0.9 % IV SOLN: 1000.0000 mL | INTRAVENOUS | Status: DC

## 2014-01-04 MED ORDER — SODIUM CHLORIDE 0.9 % IV SOLN: 1000.0000 mL | Freq: Once | INTRAVENOUS | Status: DC

## 2014-01-04 MED ORDER — HYDROMORPHONE HCL PF 1 MG/ML IJ SOLN
1.0000 mg | Freq: Once | INTRAMUSCULAR | Status: AC
  Administered 2014-01-04: 1 mg via INTRAVENOUS
  Filled 2014-01-04: qty 1

## 2014-01-04 NOTE — ED Provider Notes (Signed)
CSN: [id number]     Arrival date & time 01/04/14  [id number] History   First MD Initiated Contact with Patient 01/04/14 [phone]     Chief Complaint  Patient presents with  . Chest Pain  . Abdominal Pain      HPI Patient presents to the emergency department with nausea vomiting diarrhea for several days as well as crampy abdominal pain.  Last night she developed some substernal chest pain with associated shortness breath.  This was during an episode of vomiting.  She denies active chest pain at this time.  She has no shortness of breath at this time.  No melena or hematochezia.  No hematemesis.  She says she has a history of colitis in her nausea vomiting and diarrhea feels similar to that.  Her pain in her chest is worse with movement and vomiting last night.   Past Medical History  Diagnosis Date  . Hypertension   . Ulcerative colitis   . Seizure disorder   . HTN (hypertension), benign 01/03/2012  . Depression 01/03/2012  . 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 Hyperlipidemia 01/03/2012  . Fibroids 01/03/2012  . Chest pain, exertional   . SLE (systemic lupus erythematosus)   . GERD (gastroesophageal reflux disease)   . Hepatitis C     "signs of; not active" (05/28/2013)  . Migraines     "a few times/yr" (05/28/2013)  . Seizure     "today; < 1 yr ago, they just happen; started having them in my 20's" (05/28/2013)  . Chronic lower back pain   . Anxiety    Past Surgical History  Procedure Laterality Date  . Cholecystectomy  1978  . Abdominal hysterectomy  1978    "partial" (05/28/2013)  . Bilateral oophorectomy Bilateral 1987  . Colonoscopy N/A 05/31/2013    Procedure: COLONOSCOPY;  Surgeon: Wonda Horner, MD;  Location: [facility] ENDOSCOPY;  Service: Endoscopy;  Laterality: N/A;   History reviewed. No pertinent family history. History  Substance Use Topics  . Smoking status: Never Smoker   . Smokeless tobacco: Never Used  . Alcohol Use: Yes     Comment: 05/28/2013 "glass of wine rarely; weddings, etc"    OB History   Grav Para Term Preterm Abortions TAB SAB Ect Mult Living                 Review of Systems  All other systems reviewed and are negative.      Allergies  Aspirin; Darvocet; Dilaudid; Isometheptene-apap-dichloral; Penicillins; and Wellbutrin  Home Medications   Current Outpatient Rx  Name  Route  Sig  Dispense  Refill  . acetaminophen (TYLENOL) 500 MG tablet   Oral   Take 1,000 mg by mouth every 4 (four) hours as needed for mild pain or fever.          . dicyclomine (BENTYL) 20 MG tablet   Oral   Take 1 tablet (20 mg total) by mouth 2 (two) times daily.   20 tablet   0   . FLUoxetine (PROZAC) 20 MG capsule   Oral   Take 20 mg by mouth every morning.         . levETIRAcetam (KEPPRA XR) 500 MG 24 hr tablet   Oral   Take 500 mg by mouth 2 (two) times daily.         . Multiple Vitamin (MULTIVITAMIN WITH MINERALS) TABS tablet   Oral   Take 1 tablet by mouth daily.         Marland Kitchen  Polyethyl Glycol-Propyl Glycol (SYSTANE OP)   Ophthalmic   Apply 1 drop to eye daily.         Marland Kitchen triamterene-hydrochlorothiazide (MAXZIDE-25) 37.5-25 MG per tablet   Oral   Take 1 tablet by mouth every morning.         . ondansetron (ZOFRAN ODT) 8 MG disintegrating tablet   Oral   Take 1 tablet (8 mg total) by mouth every 8 (eight) hours as needed for nausea or vomiting.   10 tablet   0    BP 106/66  Pulse 93  Temp(Src) 97.6 F (36.4 C) (Oral)  Resp 11  SpO2 96% Physical Exam  Nursing note and vitals reviewed. Constitutional: She is oriented to person, place, and time. She appears well-developed and well-nourished. No distress.  HENT:  Head: Normocephalic and atraumatic.  Eyes: EOM are normal.  Neck: Normal range of motion.  Cardiovascular: Normal rate, regular rhythm and normal heart sounds.   Pulmonary/Chest: Effort normal and breath sounds normal.  Abdominal: Soft. She exhibits no distension. There is no tenderness.  Musculoskeletal: Normal range of  motion.  Neurological: She is alert and oriented to person, place, and time.  Skin: Skin is warm and dry.  Psychiatric: She has a normal mood and affect. Judgment normal.    ED Course  Procedures (including critical care time)  Angiocath insertion Performed by: Hoy Morn Consent: Verbal consent obtained. Risks and benefits: risks, benefits and alternatives were discussed Time out: Immediately prior to procedure a "time out" was called to verify the correct patient, procedure, equipment, support staff and site/side marked as required. Preparation: Patient was prepped and draped in the usual sterile fashion. Vein Location: left external jugular Gauge: 20 Normal blood return and flush without difficulty Patient tolerance: Patient tolerated the procedure well with no immediate complications.     Labs Review Labs Reviewed  CBC WITH DIFFERENTIAL - Abnormal; Notable for the following:    MCH 25.4 (*)    RDW 16.4 (*)    Neutrophils Relative % 31 (*)    Neutro Abs 1.4 (*)    Lymphocytes Relative 60 (*)    All other components within normal limits  COMPREHENSIVE METABOLIC PANEL  LIPASE, BLOOD  D-DIMER, QUANTITATIVE  TROPONIN I   Imaging Review Dg Chest 2 View  01/04/2014   CLINICAL DATA:  Chest pain  EXAM: CHEST  2 VIEW  COMPARISON:  CT chest 02/05/2013  FINDINGS: Heart size appears upper normal. Mediastinal and hilar contours and pulmonary vascularity are normal. Lung volumes are slightly low and the lungs are clear. Negative for pleural effusion or pneumothorax. No acute bony abnormalities identified.  IMPRESSION: Slightly low lung volumes.  No acute cardiopulmonary disease.   Electronically Signed   By: Curlene Dolphin M.D.   On: 01/04/2014 10:55  I personally reviewed the imaging tests through PACS system I reviewed available ER/hospitalization records through the EMR    EKG Interpretation   Date/Time:  Tuesday January 04 2014 09:31:21 EDT Ventricular Rate:  86 PR  Interval:  174 QRS Duration: 80 QT Interval:  368 QTC Calculation: 440 R Axis:   61 Text Interpretation:  Normal sinus rhythm Normal ECG No significant change  was found Confirmed by Kalasia Crafton  MD, Keesha Pellum ([id number]) on 01/04/2014 9:54:32 AM      MDM   Final diagnoses:  Nausea vomiting and diarrhea  Chest pain    Patient feels better at time of discharge.  Chest pain is unlikely to be ACS.  Discharge home in  good condition    Hoy Morn, MD 01/04/14 1535

## 2014-01-04 NOTE — ED Notes (Signed)
Pt reports having abd cramping for several days and then onset of left chest pain last night with sob, nausea, fever. ekg done at triage.

## 2014-01-04 NOTE — ED Notes (Signed)
IV insertion attempt unsuccessful x 2

## 2014-01-05 NOTE — Progress Notes (Signed)
   CARE MANAGEMENT ED NOTE 01/05/2014  Patient:  Erin Wolf, Erin Wolf   Account Number:  [id number]  Date Initiated:  01/05/2014  Documentation initiated by:  Jackelyn Poling  Subjective/Objective Assessment:   58 yr old medicare pt seen in [facility] ED on 01/04/14 for chest pain and abdominal pain PMh colitis Pt reports she received "very good care from Dr Venora Maples and my nurse"  " I noticed on my discharge instructions that I got dilaudid"     Subjective/Objective Assessment Detail:   Reports speaking with Pharmacy staff members to review medications Reports having stomach issues and itching noted on her way home with her husband "I did not ask what I was being given I was so tired and in pain"  Pt experience to speak with pt     Action/Plan:   CM received a voice message from pt at 0817 01/05/14 CM returned call to pt   Action/Plan Detail:   sent information to CM leadership at [phone] and spoke with ([phone]) Jeannine in pt experience at 1000 At 83 Returned a call to pt   Anticipated DC Date:  01/04/2014     Status Recommendation to Physician:   Result of Recommendation:    Other ED Services  Consult [facility]  Other  Outpatient Services - Pt will follow up    Choice offered to / List presented to:            Status of service:  Completed, signed off  ED Comments:   ED Comments Detail:

## 2014-05-09 ENCOUNTER — Encounter (HOSPITAL_COMMUNITY): Payer: Self-pay | Admitting: Emergency Medicine

## 2014-05-09 ENCOUNTER — Inpatient Hospital Stay (HOSPITAL_COMMUNITY)
Admission: EM | Admit: 2014-05-09 | Discharge: 2014-05-12 | DRG: 386 | Disposition: A | Discharge status: Home / Self Care | Payer: Medicare Other | Attending: Family Medicine | Admitting: Family Medicine

## 2014-05-09 ENCOUNTER — Emergency Department (HOSPITAL_COMMUNITY): Payer: Medicare Other

## 2014-05-09 DIAGNOSIS — I10 Essential (primary) hypertension: Secondary | ICD-10-CM | POA: Diagnosis present

## 2014-05-09 DIAGNOSIS — K219 Gastro-esophageal reflux disease without esophagitis: Secondary | ICD-10-CM | POA: Diagnosis present

## 2014-05-09 DIAGNOSIS — R197 Diarrhea, unspecified: Secondary | ICD-10-CM

## 2014-05-09 DIAGNOSIS — F32A Depression, unspecified: Secondary | ICD-10-CM

## 2014-05-09 DIAGNOSIS — R072 Precordial pain: Secondary | ICD-10-CM | POA: Diagnosis present

## 2014-05-09 DIAGNOSIS — K7689 Other specified diseases of liver: Secondary | ICD-10-CM | POA: Diagnosis not present

## 2014-05-09 DIAGNOSIS — K529 Noninfective gastroenteritis and colitis, unspecified: Secondary | ICD-10-CM | POA: Diagnosis present

## 2014-05-09 DIAGNOSIS — F3289 Other specified depressive episodes: Secondary | ICD-10-CM | POA: Diagnosis present

## 2014-05-09 DIAGNOSIS — G8929 Other chronic pain: Secondary | ICD-10-CM

## 2014-05-09 DIAGNOSIS — K5730 Diverticulosis of large intestine without perforation or abscess without bleeding: Secondary | ICD-10-CM | POA: Diagnosis present

## 2014-05-09 DIAGNOSIS — R55 Syncope and collapse: Secondary | ICD-10-CM

## 2014-05-09 DIAGNOSIS — R112 Nausea with vomiting, unspecified: Secondary | ICD-10-CM | POA: Diagnosis not present

## 2014-05-09 DIAGNOSIS — K449 Diaphragmatic hernia without obstruction or gangrene: Secondary | ICD-10-CM | POA: Diagnosis present

## 2014-05-09 DIAGNOSIS — R1032 Left lower quadrant pain: Secondary | ICD-10-CM | POA: Diagnosis not present

## 2014-05-09 DIAGNOSIS — R1115 Cyclical vomiting syndrome unrelated to migraine: Secondary | ICD-10-CM | POA: Diagnosis not present

## 2014-05-09 DIAGNOSIS — E785 Hyperlipidemia, unspecified: Secondary | ICD-10-CM | POA: Diagnosis present

## 2014-05-09 DIAGNOSIS — E860 Dehydration: Secondary | ICD-10-CM | POA: Diagnosis not present

## 2014-05-09 DIAGNOSIS — R0789 Other chest pain: Secondary | ICD-10-CM | POA: Diagnosis not present

## 2014-05-09 DIAGNOSIS — Z66 Do not resuscitate: Secondary | ICD-10-CM | POA: Diagnosis present

## 2014-05-09 DIAGNOSIS — M329 Systemic lupus erythematosus, unspecified: Secondary | ICD-10-CM | POA: Diagnosis not present

## 2014-05-09 DIAGNOSIS — R109 Unspecified abdominal pain: Secondary | ICD-10-CM | POA: Diagnosis not present

## 2014-05-09 DIAGNOSIS — R0602 Shortness of breath: Secondary | ICD-10-CM

## 2014-05-09 DIAGNOSIS — R12 Heartburn: Secondary | ICD-10-CM | POA: Diagnosis present

## 2014-05-09 DIAGNOSIS — E669 Obesity, unspecified: Secondary | ICD-10-CM | POA: Diagnosis not present

## 2014-05-09 DIAGNOSIS — Z8669 Personal history of other diseases of the nervous system and sense organs: Secondary | ICD-10-CM

## 2014-05-09 DIAGNOSIS — R079 Chest pain, unspecified: Secondary | ICD-10-CM | POA: Diagnosis not present

## 2014-05-09 DIAGNOSIS — R569 Unspecified convulsions: Secondary | ICD-10-CM

## 2014-05-09 DIAGNOSIS — R740 Nonspecific elevation of levels of transaminase and lactic acid dehydrogenase [LDH]: Secondary | ICD-10-CM

## 2014-05-09 DIAGNOSIS — K2100 Gastro-esophageal reflux disease with esophagitis, without bleeding: Secondary | ICD-10-CM | POA: Diagnosis not present

## 2014-05-09 DIAGNOSIS — K5289 Other specified noninfective gastroenteritis and colitis: Secondary | ICD-10-CM

## 2014-05-09 DIAGNOSIS — R7401 Elevation of levels of liver transaminase levels: Secondary | ICD-10-CM

## 2014-05-09 DIAGNOSIS — Z79899 Other long term (current) drug therapy: Secondary | ICD-10-CM

## 2014-05-09 DIAGNOSIS — Z800 Family history of malignant neoplasm of digestive organs: Secondary | ICD-10-CM

## 2014-05-09 DIAGNOSIS — K5190 Ulcerative colitis, unspecified, without complications: Secondary | ICD-10-CM | POA: Diagnosis not present

## 2014-05-09 DIAGNOSIS — F341 Dysthymic disorder: Secondary | ICD-10-CM

## 2014-05-09 DIAGNOSIS — Z6838 Body mass index (BMI) 38.0-38.9, adult: Secondary | ICD-10-CM | POA: Diagnosis not present

## 2014-05-09 DIAGNOSIS — M549 Dorsalgia, unspecified: Secondary | ICD-10-CM

## 2014-05-09 DIAGNOSIS — F329 Major depressive disorder, single episode, unspecified: Secondary | ICD-10-CM | POA: Diagnosis present

## 2014-05-09 DIAGNOSIS — E876 Hypokalemia: Secondary | ICD-10-CM

## 2014-05-09 LAB — URINALYSIS, ROUTINE W REFLEX MICROSCOPIC
Bilirubin Urine: NEGATIVE
GLUCOSE, UA: NEGATIVE mg/dL
Hgb urine dipstick: NEGATIVE
Ketones, ur: NEGATIVE mg/dL
Leukocytes, UA: NEGATIVE
NITRITE: NEGATIVE
PH: 7.5 (ref 5.0–8.0)
Protein, ur: NEGATIVE mg/dL
SPECIFIC GRAVITY, URINE: 1.013 (ref 1.005–1.030)
Urobilinogen, UA: 0.2 mg/dL (ref 0.0–1.0)

## 2014-05-09 LAB — COMPREHENSIVE METABOLIC PANEL
ALT: 33 U/L (ref 0–35)
AST: 18 U/L (ref 0–37)
Albumin: 3.8 g/dL (ref 3.5–5.2)
Alkaline Phosphatase: 56 U/L (ref 39–117)
Anion gap: 14 (ref 5–15)
BILIRUBIN TOTAL: 0.3 mg/dL (ref 0.3–1.2)
BUN: 12 mg/dL (ref 6–23)
CO2: 25 mEq/L (ref 19–32)
Calcium: 10 mg/dL (ref 8.4–10.5)
Chloride: 100 mEq/L (ref 96–112)
Creatinine, Ser: 0.56 mg/dL (ref 0.50–1.10)
GFR calc Af Amer: >90 mL/min (ref >90)
Glucose, Bld: 108 mg/dL — ABNORMAL HIGH (ref 70–99)
Potassium: 3.7 mEq/L (ref 3.7–5.3)
SODIUM: 139 meq/L (ref 137–147)
Total Protein: 8.5 g/dL — ABNORMAL HIGH (ref 6.0–8.3)

## 2014-05-09 LAB — CBC WITH DIFFERENTIAL/PLATELET
Basophils Absolute: 0 10*3/uL (ref 0.0–0.1)
Basophils Relative: 0 % (ref 0–1)
Eosinophils Absolute: 0 10*3/uL (ref 0.0–0.7)
Eosinophils Relative: 0 % (ref 0–5)
HCT: 39.3 % (ref 36.0–46.0)
HEMOGLOBIN: 12.7 g/dL (ref 12.0–15.0)
LYMPHS ABS: 2.3 10*3/uL (ref 0.7–4.0)
Lymphocytes Relative: 58 % — ABNORMAL HIGH (ref 12–46)
MCH: 25.3 pg — ABNORMAL LOW (ref 26.0–34.0)
MCHC: 32.3 g/dL (ref 30.0–36.0)
MCV: 78.4 fL (ref 78.0–100.0)
MONOS PCT: 10 % (ref 3–12)
Monocytes Absolute: 0.4 10*3/uL (ref 0.1–1.0)
NEUTROS PCT: 32 % — AB (ref 43–77)
Neutro Abs: 1.3 10*3/uL — ABNORMAL LOW (ref 1.7–7.7)
PLATELETS: 338 10*3/uL (ref 150–400)
RBC: 5.01 MIL/uL (ref 3.87–5.11)
RDW: 15.5 % (ref 11.5–15.5)
WBC: 4 10*3/uL (ref 4.0–10.5)

## 2014-05-09 LAB — I-STAT TROPONIN, ED: Troponin i, poc: 0 ng/mL (ref 0.00–0.08)

## 2014-05-09 LAB — LACTIC ACID, PLASMA: LACTIC ACID, VENOUS: 1.4 mmol/L (ref 0.5–2.2)

## 2014-05-09 LAB — LIPASE, BLOOD: Lipase: 24 U/L (ref 11–59)

## 2014-05-09 MED ORDER — SODIUM CHLORIDE 0.9 % IV SOLN
INTRAVENOUS | Status: AC
  Administered 2014-05-09: 16:00:00 via INTRAVENOUS

## 2014-05-09 MED ORDER — FENTANYL CITRATE 0.05 MG/ML IJ SOLN
25.0000 ug | INTRAMUSCULAR | Status: DC | PRN
  Administered 2014-05-09 – 2014-05-12 (×10): 25 ug via INTRAVENOUS
  Filled 2014-05-09 (×10): qty 2

## 2014-05-09 MED ORDER — ENOXAPARIN SODIUM 60 MG/0.6ML ~~LOC~~ SOLN
50.0000 mg | SUBCUTANEOUS | Status: DC
Start: 2014-05-09 — End: 2014-05-12
  Administered 2014-05-09 – 2014-05-11 (×3): 50 mg via SUBCUTANEOUS
  Filled 2014-05-09 (×4): qty 0.6

## 2014-05-09 MED ORDER — LORAZEPAM 2 MG/ML IJ SOLN
1.0000 mg | Freq: Once | INTRAMUSCULAR | Status: AC
  Administered 2014-05-09: 1 mg via INTRAVENOUS
  Filled 2014-05-09: qty 1

## 2014-05-09 MED ORDER — CYCLOBENZAPRINE HCL 10 MG PO TABS
10.0000 mg | ORAL_TABLET | Freq: Three times a day (TID) | ORAL | Status: DC | PRN
  Administered 2014-05-09 – 2014-05-11 (×4): 10 mg via ORAL
  Filled 2014-05-09 (×4): qty 1

## 2014-05-09 MED ORDER — FENTANYL CITRATE 0.05 MG/ML IJ SOLN
50.0000 ug | Freq: Once | INTRAMUSCULAR | Status: AC
  Administered 2014-05-09: 50 ug via INTRAVENOUS
  Filled 2014-05-09: qty 2

## 2014-05-09 MED ORDER — METRONIDAZOLE IN NACL 5-0.79 MG/ML-% IV SOLN
500.0000 mg | Freq: Three times a day (TID) | INTRAVENOUS | Status: DC
  Administered 2014-05-09 – 2014-05-12 (×9): 500 mg via INTRAVENOUS
  Filled 2014-05-09 (×10): qty 100

## 2014-05-09 MED ORDER — FLUOXETINE HCL 20 MG PO CAPS
20.0000 mg | ORAL_CAPSULE | Freq: Every morning | ORAL | Status: DC
  Administered 2014-05-10 – 2014-05-12 (×3): 20 mg via ORAL
  Filled 2014-05-09 (×3): qty 1

## 2014-05-09 MED ORDER — BIOTENE DRY MOUTH MT LIQD
15.0000 mL | Freq: Two times a day (BID) | OROMUCOSAL | Status: DC
  Administered 2014-05-09 – 2014-05-10 (×3): 15 mL via OROMUCOSAL

## 2014-05-09 MED ORDER — IOHEXOL 300 MG/ML  SOLN
100.0000 mL | Freq: Once | INTRAMUSCULAR | Status: AC | PRN
  Administered 2014-05-09: 100 mL via INTRAVENOUS

## 2014-05-09 MED ORDER — TRIAMTERENE-HCTZ 37.5-25 MG PO TABS
1.0000 | ORAL_TABLET | Freq: Every day | ORAL | Status: DC
  Administered 2014-05-10: 1 via ORAL
  Filled 2014-05-09: qty 1

## 2014-05-09 MED ORDER — SODIUM CHLORIDE 0.9 % IV BOLUS (SEPSIS)
1000.0000 mL | Freq: Once | INTRAVENOUS | Status: AC
  Administered 2014-05-09: 1000 mL via INTRAVENOUS

## 2014-05-09 MED ORDER — PROMETHAZINE HCL 25 MG/ML IJ SOLN
25.0000 mg | Freq: Once | INTRAMUSCULAR | Status: AC
  Administered 2014-05-09: 25 mg via INTRAVENOUS
  Filled 2014-05-09: qty 1

## 2014-05-09 MED ORDER — LEVETIRACETAM ER 500 MG PO TB24
500.0000 mg | ORAL_TABLET | Freq: Every morning | ORAL | Status: DC
  Administered 2014-05-10 – 2014-05-12 (×3): 500 mg via ORAL
  Filled 2014-05-09 (×3): qty 1

## 2014-05-09 MED ORDER — ACETAMINOPHEN-CODEINE #3 300-30 MG PO TABS
1.0000 | ORAL_TABLET | Freq: Three times a day (TID) | ORAL | Status: DC | PRN
  Administered 2014-05-09: 1 via ORAL
  Filled 2014-05-09: qty 1

## 2014-05-09 MED ORDER — ONDANSETRON HCL 4 MG/2ML IJ SOLN
4.0000 mg | Freq: Once | INTRAMUSCULAR | Status: AC
  Administered 2014-05-09: 4 mg via INTRAVENOUS
  Filled 2014-05-09: qty 2

## 2014-05-09 MED ORDER — IOHEXOL 300 MG/ML  SOLN
50.0000 mL | Freq: Once | INTRAMUSCULAR | Status: AC | PRN
  Administered 2014-05-09: 50 mL via ORAL

## 2014-05-09 MED ORDER — PANTOPRAZOLE SODIUM 40 MG IV SOLR
40.0000 mg | Freq: Two times a day (BID) | INTRAVENOUS | Status: DC
  Administered 2014-05-09 – 2014-05-12 (×5): 40 mg via INTRAVENOUS
  Filled 2014-05-09 (×7): qty 40

## 2014-05-09 MED ORDER — CIPROFLOXACIN IN D5W 400 MG/200ML IV SOLN
400.0000 mg | Freq: Two times a day (BID) | INTRAVENOUS | Status: DC
  Administered 2014-05-09 – 2014-05-12 (×6): 400 mg via INTRAVENOUS
  Filled 2014-05-09 (×7): qty 200

## 2014-05-09 MED ORDER — ONDANSETRON HCL 4 MG/2ML IJ SOLN: 4.0000 mg | Freq: Four times a day (QID) | INTRAMUSCULAR | Status: DC | PRN

## 2014-05-09 MED ORDER — ADULT MULTIVITAMIN W/MINERALS CH
1.0000 | ORAL_TABLET | Freq: Every day | ORAL | Status: DC
  Administered 2014-05-10 – 2014-05-12 (×3): 1 via ORAL
  Filled 2014-05-09 (×3): qty 1

## 2014-05-09 MED ORDER — ONDANSETRON HCL 4 MG PO TABS: 4.0000 mg | ORAL_TABLET | Freq: Four times a day (QID) | ORAL | Status: DC | PRN

## 2014-05-09 MED ORDER — ALUM & MAG HYDROXIDE-SIMETH 200-200-20 MG/5ML PO SUSP: 30.0000 mL | Freq: Four times a day (QID) | ORAL | Status: DC | PRN

## 2014-05-09 MED ORDER — FENTANYL CITRATE 0.05 MG/ML IJ SOLN
12.5000 ug | INTRAMUSCULAR | Status: DC | PRN
  Administered 2014-05-09: 12.5 ug via INTRAVENOUS
  Filled 2014-05-09: qty 2

## 2014-05-09 MED ORDER — FAMOTIDINE IN NACL 20-0.9 MG/50ML-% IV SOLN
20.0000 mg | Freq: Two times a day (BID) | INTRAVENOUS | Status: DC
  Filled 2014-05-09: qty 50

## 2014-05-09 MED ORDER — DIPHENHYDRAMINE HCL 50 MG/ML IJ SOLN
12.5000 mg | Freq: Once | INTRAMUSCULAR | Status: AC
  Administered 2014-05-09: 12.5 mg via INTRAVENOUS
  Filled 2014-05-09: qty 1

## 2014-05-09 MED ORDER — DICYCLOMINE HCL 20 MG PO TABS
20.0000 mg | ORAL_TABLET | Freq: Three times a day (TID) | ORAL | Status: DC
  Administered 2014-05-09 – 2014-05-12 (×11): 20 mg via ORAL
  Filled 2014-05-09 (×15): qty 1

## 2014-05-09 NOTE — Consult Note (Signed)
Date: 05/09/2014               Patient Name:  Erin Wolf MRN: [id number]  DOB: February 01, 1956 Age / Sex: 58 y.o., female   PCP: Shirline Frees, MD         Requesting Physician: Dr. Louellen Molder, MD    Consulting Reason:  Nausea, vomiting, abdominal pain     Chief Complaint: Nausea, vomiting, abdominal pain  History of Present Illness: Ms. Shambaugh is a 58 year old woman with history of ulcerative colitis (previously on mesalamine suppository), IBS, GERD, Hep C presenting with nausea, vomiting, abdominal pain x 3 days. On Friday, she felt fatigued, malaise, and nausea. Saturday night, she began to have abdominal pain located periumbilical/lower abdomen, most on the right. It was intermittent cramping and worsening in intensity. She also had epigastric/midsternal burning pain and dysphagia. On Sunday, she ate a meal and had emesis x 2. Denies blood, coffee ground or bilious emesis. She also had watery diarrhea with mucous and streaks of blood x 3. Has blood when wiping. Denies melena. Endorses rectal tenderness. Denies recent antibiotics or recent travel. Has been visiting her godmother in a nursing home - last visit Friday. Endorses similar to UC flare about a year ago (did not have epigastric burning pain then). Takes 2 tabs of ibuprofen every 4-6 hours prn for lower back pain. Not on any blood thinners at home. Last colonoscopy 05/31/2013 with normal appearing colon without evidence of active colitis. Biopsy from that procedure demonstrated benign colonic mucosa.  Meds: Current Facility-Administered Medications  Medication Dose Route Frequency Provider Last Rate Last Dose  . 0.9 %  sodium chloride infusion   Intravenous STAT Wandra Arthurs, MD 125 mL/hr at 05/09/14 1435    . acetaminophen-codeine (TYLENOL #3) 300-30 MG per tablet 1 tablet  1 tablet Oral Q8H PRN Nishant Dhungel, MD      . alum & mag hydroxide-simeth (MAALOX/MYLANTA) 200-200-20 MG/5ML suspension 30 mL  30 mL Oral Q6H PRN Nishant  Dhungel, MD      . ciprofloxacin (CIPRO) IVPB 400 mg  400 mg Intravenous Q12H Nishant Dhungel, MD      . cyclobenzaprine (FLEXERIL) tablet 10 mg  10 mg Oral Q8H PRN Nishant Dhungel, MD      . dicyclomine (BENTYL) tablet 20 mg  20 mg Oral TID AC & HS Nishant Dhungel, MD      . enoxaparin (LOVENOX) injection 50 mg  50 mg Subcutaneous Q24H Nishant Dhungel, MD      . famotidine (PEPCID) IVPB 20 mg  20 mg Intravenous Q12H Nishant Dhungel, MD      . fentaNYL (SUBLIMAZE) injection 12.5 mcg  12.5 mcg Intravenous Q4H PRN Nishant Dhungel, MD      . Derrill Memo ON 05/10/2014] FLUoxetine (PROZAC) capsule 20 mg  20 mg Oral q morning - 10a Nishant Dhungel, MD      . Derrill Memo ON 05/10/2014] levETIRAcetam (KEPPRA XR) 24 hr tablet 500 mg  500 mg Oral q morning - 10a Nishant Dhungel, MD      . metroNIDAZOLE (FLAGYL) IVPB 500 mg  500 mg Intravenous Q8H Nishant Dhungel, MD      . Derrill Memo ON 05/10/2014] multivitamin with minerals tablet 1 tablet  1 tablet Oral Daily Nishant Dhungel, MD      . ondansetron (ZOFRAN) tablet 4 mg  4 mg Oral Q6H PRN Nishant Dhungel, MD       Or  . ondansetron (ZOFRAN) injection 4 mg  4 mg Intravenous Q6H PRN Nishant Dhungel,  MD      . Derrill Memo ON 05/10/2014] triamterene-hydrochlorothiazide (MAXZIDE-25) 37.5-25 MG per tablet 1 tablet  1 tablet Oral Daily Nishant Dhungel, MD        Allergies: Allergies as of 05/09/2014 - Review Complete 05/09/2014  Allergen Reaction Noted  . Aspirin Nausea Only 05/07/2011  . Darvocet [propoxyphene n-acetaminophen] Nausea And Vomiting 05/07/2011  . Isometheptene-apap-dichloral Nausea Only 05/07/2011  . Penicillins Hives 05/07/2011  . Tape Hives 05/09/2014  . Wellbutrin [bupropion hcl] Other (See Comments) 05/07/2011  . Dilaudid [hydromorphone hcl] Itching, Nausea Only, and Rash 05/07/2011   Past Medical History  Diagnosis Date  . Hypertension   . Ulcerative colitis   . Seizure disorder   . HTN (hypertension), benign 01/03/2012  . Depression 01/03/2012  .  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 Hyperlipidemia 01/03/2012  . Fibroids 01/03/2012  . Chest pain, exertional   . SLE (systemic lupus erythematosus)   . GERD (gastroesophageal reflux disease)   . Hepatitis C     "signs of; not active" (05/28/2013)  . Migraines     "a few times/yr" (05/28/2013)  . Seizure     "today; < 1 yr ago, they just happen; started having them in my 20's" (05/28/2013)  . Chronic lower back pain   . Anxiety    Past Surgical History  Procedure Laterality Date  . Cholecystectomy  1978  . Abdominal hysterectomy  1978    "partial" (05/28/2013)  . Bilateral oophorectomy Bilateral 1987  . Colonoscopy N/A 05/31/2013    Procedure: COLONOSCOPY;  Surgeon: Wonda Horner, MD;  Location: [facility] ENDOSCOPY;  Service: Endoscopy;  Laterality: N/A;   Family History Brother had colon cancer diagnosed at age 25. He also had UC.  History   Social History  . Marital Status: Married    Spouse Name: N/A    Number of Children: N/A  . Years of Education: N/A   Occupational History  . Not on file.   Social History Main Topics  . Smoking status: Never Smoker   . Smokeless tobacco: Never Used  . Alcohol Use: Yes     Comment: 05/28/2013 "glass of wine rarely; weddings, etc"  . Drug Use: No  . Sexual Activity: Yes   Other Topics Concern  . Not on file   Social History Narrative  . No narrative on file    Review of Systems: Constitutional: Denies fever. +chills, +fatigue, +malaise, +anorexia HEENT: Denies vision changes Respiratory: +SOB Cardiovascular: +burning sternal chest discomfort.  Gastrointestinal: as per HPI Genitourinary: Denies dysuria, hematuria Endocrine: Denies hot or cold intolerance Musculoskeletal: +arthralgias, +chronic lower back pain Skin: Denies rash Neurological: Denies paresthesias, +lightheaded with standing Psychiatric/Behavioral: Denies confusion   Physical Exam: Blood pressure 114/68, pulse 77, temperature 97.7 F (36.5 C), temperature source Oral,  resp. rate 18, height 5\' 5"  (1.651 m), weight 230 lb 6.1 oz (104.5 kg), SpO2 100.00%. General: NAD HEENT: [place]/AT, sclera anicteric, dry oral mucosa Neck: supple CV: RRR Chest: CTAB, breathing comfortably Abdominal: soft, moderate diffuse tenderness to palpation most in RLQ, nondistended, no rebound/guarding Extremities: warm, well perfused Neuro: no gross deficits  Lab results: Basic Metabolic Panel:  Recent Labs  05/09/14 1000  NA 139  K 3.7  CL 100  CO2 25  GLUCOSE 108*  BUN 12  CREATININE 0.56  CALCIUM 10.0   Liver Function Tests:  Recent Labs  05/09/14 1000  AST 18  ALT 33  ALKPHOS 56  BILITOT 0.3  PROT 8.5*  ALBUMIN 3.8    Recent Labs  05/09/14 1000  LIPASE 24    CBC:  Recent Labs  05/09/14 1000  WBC 4.0  NEUTROABS 1.3*  HGB 12.7  HCT 39.3  MCV 78.4  PLT 338    Misc. Labs: Lactic acid 1.4  Imaging results:  Ct Abdomen Pelvis W Contrast  05/09/2014   CLINICAL DATA:  Abdominal pain and diarrhea.  History of colitis.  EXAM: CT ABDOMEN AND PELVIS WITH CONTRAST  TECHNIQUE: Multidetector CT imaging of the abdomen and pelvis was performed using the standard protocol following bolus administration of intravenous contrast.  CONTRAST:  100 mL OMNIPAQUE IOHEXOL 300 MG/ML SOLN, 50 mL OMNIPAQUE IOHEXOL 300 MG/ML SOLN  COMPARISON:  CT abdomen and pelvis 05/27/2013. Plain films chest and abdomen earlier this same day.  FINDINGS: Minimal dependent atelectasis is seen in the lung bases. No pleural or pericardial effusion.  Minimal sigmoid diverticulosis is identified. The colon otherwise appears normal. The stomach and small bowel are unremarkable. The appendix is not discretely visualized and may have been removed. No evidence of inflammatory process is seen. The patient is status post hysterectomy.  The gallbladder has been removed. Low attenuation of the liver is consistent with fatty infiltration. The spleen, right adrenal gland, pancreas and kidneys appear  normal. 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 There is no lymphadenopathy or fluid. No focal bony abnormality is identified. Degenerative disc disease L3-4, L4-5 and L5-S1 is noted.  IMPRESSION: No acute finding abdomen or pelvis.  Status post hysterectomy and cholecystectomy.  Fatty infiltration of the liver.  Mild diverticulosis without diverticulitis.   Electronically Signed   By: Inge Rise M.D.   On: 05/09/2014 11:34   Dg Abd Acute W/chest  05/09/2014   CLINICAL DATA:  Chest and abdominal pain for 3 days.  EXAM: ACUTE ABDOMEN SERIES (ABDOMEN 2 VIEW & CHEST 1 VIEW)  COMPARISON:  PA and lateral chest 01/04/2014. CT abdomen and pelvis 05/27/2013.  FINDINGS: Single view of the chest demonstrates clear lungs and normal heart size. No pneumothorax or pleural effusion.  Two views of the abdomen show no free intraperitoneal air. The bowel gas pattern is nonobstructive. No worrisome abdominal calcification is identified.  IMPRESSION: No acute finding chest or abdomen.   Electronically Signed   By: Inge Rise M.D.   On: 05/09/2014 09:37    Assessment, Plan, & Recommendations by Problem: 58 year old woman with history of ulcerative colitis (previously on Bentyl and mesalamine suppository, has not been on any meds for about two months), GERD, Hep C presenting with nausea, vomiting, abdominal pain x 3 days. Differential diagnosis includes viral vs bacterial gastroenteritis, IBS, IBD, ischemic colitis. Suspect GERD with epigastric symptoms and dysphagia. Streaks of blood in stools may be 2/2 internal hemorrhoids.  -Continue supportive care (NPO, IVF, pain control). Patient requests to not be given codeine. -Will check for celiac disease (TTG IgA, Quantitative IgA level) -IV protonix 40mg  Q12 hours -Rule out infection with C. Diff PCR, stool pathogen panel -Continue antibiotics  Patient seen and discussed with attending, Dr. Michail Sermon  Signed: Jacques Earthly, MD 05/09/2014,  3:21 PM

## 2014-05-09 NOTE — ED Notes (Signed)
Pt states that she felt bad on Friday. Yesterday she started feeling better yesterday and tried eating a full meal then had diarrhea yesterday and also was throwing up last night and started having epigastric pain and left chest pain since yesterday. Pt states that when she woke up this morning with the pain still going on wanted to be checked out.

## 2014-05-09 NOTE — ED Provider Notes (Signed)
CSN: [id number]     Arrival date & time 05/09/14  0175 History   First MD Initiated Contact with Patient 05/09/14 [phone]     Chief Complaint  Patient presents with  . Abdominal Pain  . Chest Pain     (Consider location/radiation/quality/duration/timing/severity/associated sxs/prior Treatment) The history is provided by the patient.  LEXINGTON DEVINE is a 58 y.o. female hx of HTN, ulcerative colitis, seizure here with abdominal pain, vomiting, chest pain. She has been having epigastric pain and lower abdominal pain for the last 2 days. Yesterday, started having diarrhea and vomiting. She also has left sided chest pain when she has epigastric pain but was never diagnosed with reflux. She has tried promethazine at home with minimal relief. She denies fever. She had previous admissions for ulcerative colitis but no previous surgeries.    Past Medical History  Diagnosis Date  . Hypertension   . Ulcerative colitis   . Seizure disorder   . HTN (hypertension), benign 01/03/2012  . Depression 01/03/2012  . 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 Hyperlipidemia 01/03/2012  . Fibroids 01/03/2012  . Chest pain, exertional   . SLE (systemic lupus erythematosus)   . GERD (gastroesophageal reflux disease)   . Hepatitis C     "signs of; not active" (05/28/2013)  . Migraines     "a few times/yr" (05/28/2013)  . Seizure     "today; < 1 yr ago, they just happen; started having them in my 20's" (05/28/2013)  . Chronic lower back pain   . Anxiety    Past Surgical History  Procedure Laterality Date  . Cholecystectomy  1978  . Abdominal hysterectomy  1978    "partial" (05/28/2013)  . Bilateral oophorectomy Bilateral 1987  . Colonoscopy N/A 05/31/2013    Procedure: COLONOSCOPY;  Surgeon: Wonda Horner, MD;  Location: [facility] ENDOSCOPY;  Service: Endoscopy;  Laterality: N/A;   History reviewed. No pertinent family history. History  Substance Use Topics  . Smoking status: Never Smoker   . Smokeless tobacco: Never Used   . Alcohol Use: Yes     Comment: 05/28/2013 "glass of wine rarely; weddings, etc"   OB History   Grav Para Term Preterm Abortions TAB SAB Ect Mult Living                 Review of Systems  Cardiovascular: Positive for chest pain.  Gastrointestinal: Positive for abdominal pain.      Allergies  Aspirin; Darvocet; Isometheptene-apap-dichloral; Penicillins; Tape; Wellbutrin; and Dilaudid  Home Medications   Prior to Admission medications   Medication Sig Start Date End Date Taking? Authorizing Provider  acetaminophen-codeine (TYLENOL #3) 300-30 MG per tablet Take 1 tablet by mouth every 8 (eight) hours as needed for moderate pain.  04/19/14  Yes Historical Provider, MD  cyclobenzaprine (FLEXERIL) 10 MG tablet Take 1 tablet by mouth every 8 (eight) hours as needed for muscle spasms.  04/23/14  Yes Historical Provider, MD  FLUoxetine (PROZAC) 20 MG capsule Take 20 mg by mouth every morning.   Yes Historical Provider, MD  levETIRAcetam (KEPPRA XR) 500 MG 24 hr tablet Take 500 mg by mouth every morning.    Yes Historical Provider, MD  Multiple Vitamin (MULTIVITAMIN WITH MINERALS) TABS tablet Take 1 tablet by mouth daily.   Yes Historical Provider, MD  triamterene-hydrochlorothiazide (MAXZIDE-25) 37.5-25 MG per tablet Take 1 tablet by mouth every morning.   Yes Historical Provider, MD   BP 113/63  Pulse 81  Temp(Src) 98 F (36.7 C) (Oral)  Resp 16  SpO2 100% Physical Exam  Nursing note and vitals reviewed. Constitutional: She is oriented to person, place, and time.  Uncomfortable   HENT:  Head: Normocephalic.  MM slightly dry   Eyes: Conjunctivae are normal. Pupils are equal, round, and reactive to light.  Neck: Normal range of motion. Neck supple.  Cardiovascular: Normal rate, regular rhythm and normal heart sounds.   Pulmonary/Chest: Effort normal and breath sounds normal. No respiratory distress. She has no wheezes. She has no rales.  Abdominal: Soft.  Mild diffuse tenderness,  worse in LLQ. No rebound   Musculoskeletal: Normal range of motion. She exhibits no edema and no tenderness.  Neurological: She is alert and oriented to person, place, and time. No cranial nerve deficit. Coordination normal.  Skin: Skin is warm and dry.  Psychiatric: She has a normal mood and affect. Her behavior is normal. Judgment and thought content normal.    ED Course  Procedures (including critical care time) Labs Review Labs Reviewed  CBC WITH DIFFERENTIAL - Abnormal; Notable for the following:    MCH 25.3 (*)    Neutrophils Relative % 32 (*)    Neutro Abs 1.3 (*)    Lymphocytes Relative 58 (*)    All other components within normal limits  COMPREHENSIVE METABOLIC PANEL - Abnormal; Notable for the following:    Glucose, Bld 108 (*)    Total Protein 8.5 (*)    All other components within normal limits  URINALYSIS, ROUTINE W REFLEX MICROSCOPIC - Abnormal; Notable for the following:    APPearance CLOUDY (*)    All other components within normal limits  LIPASE, BLOOD  I-STAT TROPOININ, ED    Imaging Review Ct Abdomen Pelvis W Contrast  05/09/2014   CLINICAL DATA:  Abdominal pain and diarrhea.  History of colitis.  EXAM: CT ABDOMEN AND PELVIS WITH CONTRAST  TECHNIQUE: Multidetector CT imaging of the abdomen and pelvis was performed using the standard protocol following bolus administration of intravenous contrast.  CONTRAST:  100 mL OMNIPAQUE IOHEXOL 300 MG/ML SOLN, 50 mL OMNIPAQUE IOHEXOL 300 MG/ML SOLN  COMPARISON:  CT abdomen and pelvis 05/27/2013. Plain films chest and abdomen earlier this same day.  FINDINGS: Minimal dependent atelectasis is seen in the lung bases. No pleural or pericardial effusion.  Minimal sigmoid diverticulosis is identified. The colon otherwise appears normal. The stomach and small bowel are unremarkable. The appendix is not discretely visualized and may have been removed. No evidence of inflammatory process is seen. The patient is status post hysterectomy.   The gallbladder has been removed. Low attenuation of the liver is consistent with fatty infiltration. The spleen, right adrenal gland, pancreas and kidneys appear normal. 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 There is no lymphadenopathy or fluid. No focal bony abnormality is identified. Degenerative disc disease L3-4, L4-5 and L5-S1 is noted.  IMPRESSION: No acute finding abdomen or pelvis.  Status post hysterectomy and cholecystectomy.  Fatty infiltration of the liver.  Mild diverticulosis without diverticulitis.   Electronically Signed   By: Inge Rise M.D.   On: 05/09/2014 11:34   Dg Abd Acute W/chest  05/09/2014   CLINICAL DATA:  Chest and abdominal pain for 3 days.  EXAM: ACUTE ABDOMEN SERIES (ABDOMEN 2 VIEW & CHEST 1 VIEW)  COMPARISON:  PA and lateral chest 01/04/2014. CT abdomen and pelvis 05/27/2013.  FINDINGS: Single view of the chest demonstrates clear lungs and normal heart size. No pneumothorax or pleural effusion.  Two views of the  abdomen show no free intraperitoneal air. The bowel gas pattern is nonobstructive. No worrisome abdominal calcification is identified.  IMPRESSION: No acute finding chest or abdomen.   Electronically Signed   By: Inge Rise M.D.   On: 05/09/2014 09:37     EKG Interpretation   Date/Time:  Monday May 09 2014 09:05:25 EDT Ventricular Rate:  88 PR Interval:  164 QRS Duration: 64 QT Interval:  347 QTC Calculation: 420 R Axis:   46 Text Interpretation:  Sinus rhythm Borderline T abnormalities, inferior  leads No significant change since last tracing Confirmed by Porter Moes  MD, Ragen Laver  ([id number]) on 05/09/2014 9:26:24 AM      MDM   Final diagnoses:  None    SHAREE STURDY is a 58 y.o. female here with epigastric pain, chest pain, ab pain. Symptoms likely from UC flare. I doubt ACS. Will get labs, lipase, xrays. Will likely need CT ab/pel.   1:03 PM CT showed no acute changes. Still can't tolerate PO despite multiple rounds  of pain meds and nausea meds. Will admit for observation.     Wandra Arthurs, MD 05/09/14 [phone]

## 2014-05-09 NOTE — Consult Note (Signed)
Referring Provider: Dr. Clementeen Graham Primary Care Physician:  Shirline Frees, MD Primary Gastroenterologist:  Dr. Penelope Coop  Reason for Consultation:  Abdominal pain; Ulcerative Colitis  HPI: Erin Wolf is a 58 y.o. female with a long history of ulcerative colitis of unknown extent who has been on mesalamine suppositories prn who also has IBS, GERD and history of Hep C. For the last 3 days she has had abdominal pain, nausea, vomiting, and diarrhea. Abdominal pain is mainly in her lower abdomen bilaterally and is crampy with increasing intensity. Has had diarrhea with streaks of blood and mucosus during the last 3 days. Reports rectal pain and tenderness. Denies melena, hematemesis. This past weekend she also developed burning in her chest and epigastric area with eating and dysphagia and denies previous problems with that. Takes Tums prn for that in the past. Pain feels like her last UC flare that occurred a year ago and colonoscopy (8/14) at that time was normal with negative biopsies. Colonoscopy in 2012 showed focal colitis in the hepatic flexure and was otherwise normal. Chronically takes Ibuprofen every 4-6 hours for lower back pain. Denies alcohol or smoking. She is very concerned about her burning in her throat stating that she has never had this happen to this degree. No burning with drinking liquids but it occurs with foods intermittently since this weekend. Over the years she does have increased abdominal pain after eating spicy foods that is crampy and relieved with Dicyclomine. Denies urgency to move her bowels when this happens. CT today negative for any acute findings.      Past Medical History  Diagnosis Date  . Hypertension   . Ulcerative colitis   . Seizure disorder   . HTN (hypertension), benign 01/03/2012  . Depression 01/03/2012  . 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 Hyperlipidemia 01/03/2012  . Fibroids 01/03/2012  . Chest pain, exertional   . SLE (systemic lupus erythematosus)    . GERD (gastroesophageal reflux disease)   . Hepatitis C     "signs of; not active" (05/28/2013)  . Migraines     "a few times/yr" (05/28/2013)  . Seizure     "today; < 1 yr ago, they just happen; started having them in my 20's" (05/28/2013)  . Chronic lower back pain   . Anxiety     Past Surgical History  Procedure Laterality Date  . Cholecystectomy  1978  . Abdominal hysterectomy  1978    "partial" (05/28/2013)  . Bilateral oophorectomy Bilateral 1987  . Colonoscopy N/A 05/31/2013    Procedure: COLONOSCOPY;  Surgeon: Wonda Horner, MD;  Location: [facility] ENDOSCOPY;  Service: Endoscopy;  Laterality: N/A;    Prior to Admission medications   Medication Sig Start Date End Date Taking? Authorizing Provider  acetaminophen-codeine (TYLENOL #3) 300-30 MG per tablet Take 1 tablet by mouth every 8 (eight) hours as needed for moderate pain.  04/19/14  Yes Historical Provider, MD  cyclobenzaprine (FLEXERIL) 10 MG tablet Take 1 tablet by mouth every 8 (eight) hours as needed for muscle spasms.  04/23/14  Yes Historical Provider, MD  FLUoxetine (PROZAC) 20 MG capsule Take 20 mg by mouth every morning.   Yes Historical Provider, MD  levETIRAcetam (KEPPRA XR) 500 MG 24 hr tablet Take 500 mg by mouth every morning.    Yes Historical Provider, MD  Multiple Vitamin (MULTIVITAMIN WITH MINERALS) TABS tablet Take 1 tablet by mouth daily.   Yes Historical Provider, MD  triamterene-hydrochlorothiazide (MAXZIDE-25) 37.5-25 MG per tablet Take 1 tablet by mouth every morning.  Yes Historical Provider, MD    Scheduled Meds: . sodium chloride   Intravenous STAT  . ciprofloxacin  400 mg Intravenous Q12H  . dicyclomine  20 mg Oral TID AC & HS  . enoxaparin (LOVENOX) injection  50 mg Subcutaneous Q24H  . famotidine (PEPCID) IV  20 mg Intravenous Q12H  . [START ON 05/10/2014] FLUoxetine  20 mg Oral q morning - 10a  . [START ON 05/10/2014] levETIRAcetam  500 mg Oral q morning - 10a  . metronidazole  500 mg Intravenous Q8H   . [START ON 05/10/2014] multivitamin with minerals  1 tablet Oral Daily  . [START ON 05/10/2014] triamterene-hydrochlorothiazide  1 tablet Oral Daily   Continuous Infusions:  PRN Meds:.alum & mag hydroxide-simeth, cyclobenzaprine, fentaNYL, ondansetron (ZOFRAN) IV, ondansetron  Allergies as of 05/09/2014 - Review Complete 05/09/2014  Allergen Reaction Noted  . Aspirin Nausea Only 05/07/2011  . Darvocet [propoxyphene n-acetaminophen] Nausea And Vomiting 05/07/2011  . Isometheptene-apap-dichloral Nausea Only 05/07/2011  . Penicillins Hives 05/07/2011  . Tape Hives 05/09/2014  . Wellbutrin [bupropion hcl] Other (See Comments) 05/07/2011  . Dilaudid [hydromorphone hcl] Itching, Nausea Only, and Rash 05/07/2011    History reviewed. No pertinent family history.  History   Social History  . Marital Status: Married    Spouse Name: N/A    Number of Children: N/A  . Years of Education: N/A   Occupational History  . Not on file.   Social History Main Topics  . Smoking status: Never Smoker   . Smokeless tobacco: Never Used  . Alcohol Use: Yes     Comment: 05/28/2013 "glass of wine rarely; weddings, etc"  . Drug Use: No  . Sexual Activity: Yes   Other Topics Concern  . Not on file   Social History Narrative  . No narrative on file    Review of Systems: All negative from GI standpoint except as stated above in HPI.  Physical Exam: Vital signs: Filed Vitals:   05/09/14 1428  BP: 114/68  Pulse: 77  Temp: 97.7 F (36.5 C)  Resp: 18   Last BM Date: 05/09/14 General:   Alert,  Well-developed, well-nourished, pleasant and cooperative in NAD HEENT: anicteric Neck: supple, nontender Lungs:  Clear throughout to auscultation.   No wheezes, crackles, or rhonchi. No acute distress. Heart:  Regular rate and rhythm; no murmurs, clicks, rubs,  or gallops. Abdomen: diffusely tender with guarding, soft, nondistended, +BS  Rectal:  Deferred Ext: no edema  GI:  Lab Results:  Recent  Labs  05/09/14 1000  WBC 4.0  HGB 12.7  HCT 39.3  PLT 338   BMET  Recent Labs  05/09/14 1000  NA 139  K 3.7  CL 100  CO2 25  GLUCOSE 108*  BUN 12  CREATININE 0.56  CALCIUM 10.0   LFT  Recent Labs  05/09/14 1000  PROT 8.5*  ALBUMIN 3.8  AST 18  ALT 33  ALKPHOS 56  BILITOT 0.3   PT/INR No results found for this basename: LABPROT, INR,  in the last 72 hours   Studies/Results: Ct Abdomen Pelvis W Contrast  05/09/2014   CLINICAL DATA:  Abdominal pain and diarrhea.  History of colitis.  EXAM: CT ABDOMEN AND PELVIS WITH CONTRAST  TECHNIQUE: Multidetector CT imaging of the abdomen and pelvis was performed using the standard protocol following bolus administration of intravenous contrast.  CONTRAST:  100 mL OMNIPAQUE IOHEXOL 300 MG/ML SOLN, 50 mL OMNIPAQUE IOHEXOL 300 MG/ML SOLN  COMPARISON:  CT abdomen and pelvis 05/27/2013.  Plain films chest and abdomen earlier this same day.  FINDINGS: Minimal dependent atelectasis is seen in the lung bases. No pleural or pericardial effusion.  Minimal sigmoid diverticulosis is identified. The colon otherwise appears normal. The stomach and small bowel are unremarkable. The appendix is not discretely visualized and may have been removed. No evidence of inflammatory process is seen. The patient is status post hysterectomy.  The gallbladder has been removed. Low attenuation of the liver is consistent with fatty infiltration. The spleen, right adrenal gland, pancreas and kidneys appear normal. 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 There is no lymphadenopathy or fluid. No focal bony abnormality is identified. Degenerative disc disease L3-4, L4-5 and L5-S1 is noted.  IMPRESSION: No acute finding abdomen or pelvis.  Status post hysterectomy and cholecystectomy.  Fatty infiltration of the liver.  Mild diverticulosis without diverticulitis.   Electronically Signed   By: Inge Rise M.D.   On: 05/09/2014 11:34   Dg Abd Acute  W/chest  05/09/2014   CLINICAL DATA:  Chest and abdominal pain for 3 days.  EXAM: ACUTE ABDOMEN SERIES (ABDOMEN 2 VIEW & CHEST 1 VIEW)  COMPARISON:  PA and lateral chest 01/04/2014. CT abdomen and pelvis 05/27/2013.  FINDINGS: Single view of the chest demonstrates clear lungs and normal heart size. No pneumothorax or pleural effusion.  Two views of the abdomen show no free intraperitoneal air. The bowel gas pattern is nonobstructive. No worrisome abdominal calcification is identified.  IMPRESSION: No acute finding chest or abdomen.   Electronically Signed   By: Inge Rise M.D.   On: 05/09/2014 09:37    Impression/Plan: 58 yo with chronic history of ulcerative colitis that has been in remission with rare use of Mesalamine suppositories. Her history is more consistent with gastroenteritis than a UC flare. She has IBS symptoms as well. Ischemic colitis also possible but less likely. Suspect rectal outlet source for bleeding. Agree with stool studies. Will hold off on colonoscopy or flex sig unless diarrhea persisting despite negative stool studies. Check TTG for celiac disease. Continue Cipro/Flagyl.  Her GERD has worsened and needs to start a IV PPI BID and then take PO QD as outpt. May need an EGD if heartburn and dysphagia persisting despite medical therapy. Candida Esophagitis also possible source of these recent burning symptoms with swallowing. Ice chips and sips of water ok. Reports that meds with codeine in them cause severe burning in her throat so will d/c that and let Dr. Clementeen Graham know.  Patient evaluated and discussed with Dr. Randell Patient (Internal Med Resident).        LOS: 0 days   [place] C.  05/09/2014, 4:21 PM

## 2014-05-09 NOTE — H&P (Signed)
[facility] History and Physical  Erin Wolf CHE:[id number] DOB: [birth year] DOA: 05/09/2014  Referring physician: Dr Darl Householder PCP: Shirline Frees, MD   Chief Complaint:  Abdominal pain nausea , vomiting and diarrhea since one day   HPI:  58 year old obese female with history of ulcerative colitis currently not on any medications, with a normal colonoscopy in August 2014 when she was admitted for ulcerative colitis flareup like symptoms, depression, hypertension, seizure disorder who presented to the ED with 3 episodes of vomiting along with 3 episodes of watery diarrhea mucus and scanty streaks of blood since yesterday morning. Patient reports feeling nauseous for almost one week and having a very poor appetite. She had a full meal yesterday at home and shortly after which she had episodes of nausea with vomiting and diarrhea. She also reports diffuse abdominal crampy pain. She described the symptoms to be similar to her ulcerative colitis flare. She denies any fever, chills, headache, shortness of breath or palpitation. She reports having epigastric burning pain with acid reflux since this morning. She denies any urinary symptoms. She denies eating anything outside or recent travel. She denies being on antibiotics or taking over-the-counter NSAIDs. She reports visiting her sick her adopted mother at the nursing home recently. She reports taking mesalamine suppository during similar flareup symptoms but has run out all that.  Course in the ED Patient's vitals were stable. Blood work done showed normal WBC, hemoglobin and platelets. Chemistry was unremarkable. Lipase was normal. First set of troponin and EKG was unremarkable. UA was unremarkable as well. This and given 2 doses of 50 mcg IV fentanyl along with Phenergan without much relief. Hospitalist consulted for admission under observation.   Review of Systems:  Constitutional: Denies fever, chills, diaphoresis, appetite change and  fatigue.  HEENT: Denies  eye pain,  ear pain, congestion, sore throat,mouth sores, trouble swallowing, neck pain,  Respiratory: Denies SOB, DOE, cough, chest tightness,  and wheezing.   Cardiovascular: Denies chest pain, palpitations and leg swelling.  Gastrointestinal: Denies nausea, vomiting, abdominal pain, diarrhea, , blood in stool and abdominal distention.  Genitourinary: Denies dysuria, urgency, frequency, hematuria, flank pain and difficulty urinating.  Endocrine: Denies: hot or cold intolerance, Musculoskeletal: Denies myalgias, back pain, arthralgias Skin: Denies pallor, rash and wound.  Neurological: Denies dizziness, seizures, syncope, weakness, light-headedness, numbness and headaches.  Psychiatric/Behavioral: Denies confusion,    Past Medical History  Diagnosis Date  . Hypertension   . Ulcerative colitis   . Seizure disorder   . HTN (hypertension), benign 01/03/2012  . Depression 01/03/2012  . 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 Hyperlipidemia 01/03/2012  . Fibroids 01/03/2012  . Chest pain, exertional   . SLE (systemic lupus erythematosus)   . GERD (gastroesophageal reflux disease)   . Hepatitis C     "signs of; not active" (05/28/2013)  . Migraines     "a few times/yr" (05/28/2013)  . Seizure     "today; < 1 yr ago, they just happen; started having them in my 20's" (05/28/2013)  . Chronic lower back pain   . Anxiety    Past Surgical History  Procedure Laterality Date  . Cholecystectomy  1978  . Abdominal hysterectomy  1978    "partial" (05/28/2013)  . Bilateral oophorectomy Bilateral 1987  . Colonoscopy N/A 05/31/2013    Procedure: COLONOSCOPY;  Surgeon: Wonda Horner, MD;  Location: [facility] ENDOSCOPY;  Service: Endoscopy;  Laterality: N/A;   Social History:  reports that she has never smoked. She has never used smokeless tobacco.  She reports that she drinks alcohol. She reports that she does not use illicit drugs.  Allergies  Allergen Reactions  . Aspirin Nausea Only  .  Darvocet [Propoxyphene N-Acetaminophen] Nausea And Vomiting  . Isometheptene-Apap-Dichloral Nausea Only  . Penicillins Hives  . Tape Hives  . Wellbutrin [Bupropion Hcl] Other (See Comments)    Insomnia and headaches  . Dilaudid [Hydromorphone Hcl] Itching, Nausea Only and Rash    History reviewed. No pertinent family history.  Prior to Admission medications   Medication Sig Start Date End Date Taking? Authorizing Provider  acetaminophen-codeine (TYLENOL #3) 300-30 MG per tablet Take 1 tablet by mouth every 8 (eight) hours as needed for moderate pain.  04/19/14  Yes Historical Provider, MD  cyclobenzaprine (FLEXERIL) 10 MG tablet Take 1 tablet by mouth every 8 (eight) hours as needed for muscle spasms.  04/23/14  Yes Historical Provider, MD  FLUoxetine (PROZAC) 20 MG capsule Take 20 mg by mouth every morning.   Yes Historical Provider, MD  levETIRAcetam (KEPPRA XR) 500 MG 24 hr tablet Take 500 mg by mouth every morning.    Yes Historical Provider, MD  Multiple Vitamin (MULTIVITAMIN WITH MINERALS) TABS tablet Take 1 tablet by mouth daily.   Yes Historical Provider, MD  triamterene-hydrochlorothiazide (MAXZIDE-25) 37.5-25 MG per tablet Take 1 tablet by mouth every morning.   Yes Historical Provider, MD     Physical Exam:  Filed Vitals:   05/09/14 0911 05/09/14 1148 05/09/14 1308  BP: 132/63 113/63 125/61  Pulse: 94 81 75  Temp: 98 F (36.7 C)    TempSrc: Oral    Resp: 18 16   SpO2: 98% 100% 100%    Constitutional: Vital signs reviewed. Middle aged obese female appears fatigued HEENT: no pallor, no icterus, dry oral mucosa,  Cardiovascular: RRR, S1 normal, S2 normal, no MRG Chest: CTAB, no wheezes, rales, or rhonchi Abdominal: Soft.  non-distended, bowel sounds are normal, diffuse tenderness to palpation, no organomegaly, or guarding present.  Ext: warm, no edema Neurological: Alert and oriented  Labs on Admission:  Basic Metabolic Panel:  Recent Labs Lab 05/09/14 1000  NA  139  K 3.7  CL 100  CO2 25  GLUCOSE 108*  BUN 12  CREATININE 0.56  CALCIUM 10.0   Liver Function Tests:  Recent Labs Lab 05/09/14 1000  AST 18  ALT 33  ALKPHOS 56  BILITOT 0.3  PROT 8.5*  ALBUMIN 3.8    Recent Labs Lab 05/09/14 1000  LIPASE 24   No results found for this basename: AMMONIA,  in the last 168 hours CBC:  Recent Labs Lab 05/09/14 1000  WBC 4.0  NEUTROABS 1.3*  HGB 12.7  HCT 39.3  MCV 78.4  PLT 338   Cardiac Enzymes: No results found for this basename: CKTOTAL, CKMB, CKMBINDEX, TROPONINI,  in the last 168 hours BNP: No components found with this basename: POCBNP,  CBG: No results found for this basename: GLUCAP,  in the last 168 hours  Radiological Exams on Admission: Ct Abdomen Pelvis W Contrast  05/09/2014   CLINICAL DATA:  Abdominal pain and diarrhea.  History of colitis.  EXAM: CT ABDOMEN AND PELVIS WITH CONTRAST  TECHNIQUE: Multidetector CT imaging of the abdomen and pelvis was performed using the standard protocol following bolus administration of intravenous contrast.  CONTRAST:  100 mL OMNIPAQUE IOHEXOL 300 MG/ML SOLN, 50 mL OMNIPAQUE IOHEXOL 300 MG/ML SOLN  COMPARISON:  CT abdomen and pelvis 05/27/2013. Plain films chest and abdomen earlier this same day.  FINDINGS:  Minimal dependent atelectasis is seen in the lung bases. No pleural or pericardial effusion.  Minimal sigmoid diverticulosis is identified. The colon otherwise appears normal. The stomach and small bowel are unremarkable. The appendix is not discretely visualized and may have been removed. No evidence of inflammatory process is seen. The patient is status post hysterectomy.  The gallbladder has been removed. Low attenuation of the liver is consistent with fatty infiltration. The spleen, right adrenal gland, pancreas and kidneys appear normal. 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 There is no lymphadenopathy or fluid. No focal bony abnormality is identified.  Degenerative disc disease L3-4, L4-5 and L5-S1 is noted.  IMPRESSION: No acute finding abdomen or pelvis.  Status post hysterectomy and cholecystectomy.  Fatty infiltration of the liver.  Mild diverticulosis without diverticulitis.   Electronically Signed   By: Inge Rise M.D.   On: 05/09/2014 11:34   Dg Abd Acute W/chest  05/09/2014   CLINICAL DATA:  Chest and abdominal pain for 3 days.  EXAM: ACUTE ABDOMEN SERIES (ABDOMEN 2 VIEW & CHEST 1 VIEW)  COMPARISON:  PA and lateral chest 01/04/2014. CT abdomen and pelvis 05/27/2013.  FINDINGS: Single view of the chest demonstrates clear lungs and normal heart size. No pneumothorax or pleural effusion.  Two views of the abdomen show no free intraperitoneal air. The bowel gas pattern is nonobstructive. No worrisome abdominal calcification is identified.  IMPRESSION: No acute finding chest or abdomen.   Electronically Signed   By: Inge Rise M.D.   On: 05/09/2014 09:37    EKG: NSR at 88, T-wave flattening in inferior leads,   Assessment/Plan Principal Problem:   Acute Gastroenteritis dehydration Differential includes infectious gastroenteritis versus flare up of her ulcerative colitis (appears less likely from her clinical presentation and negative workup so far) Admit to MedSurg under observation IV hydration with normal saline with supportive care with when necessary fentanyl for pain along with Vicodin and Zofran for nausea. I will place her on empiric Cipro and Flagyl. -Check lactic acid. -Bentyl 20 mg 3 times a day and at bedtime -Check stool for C. difficile and GI pathogen panel. -Check stool for occult blood -Discussed with Dr. Michail Sermon with [facility] will see patient in consultation. -Will hold use of IV steroid or mesalamine.  Active Problems:   HTN (hypertension), benign -Blood pressure stable. Resume home medications    Depression Continue Prozac  Seizure disorders Continue Keppra   Atypical chest pain Likely secondary  to GERD. I will place her on twice a day Pepcid and Maalox. Does not need further cardiac workup      Diet n.p.o. except for sips  DVT Prophylaxis: sq lovenox   Code Status:  DNR Family Communication: discussed with husband at bedside Disposition Plan: Home once improved  Sarai January, [place] [facility] Pager [phone]  Total time spent on admission :50 minutes  If 7PM-7AM, please contact night-coverage [email or web address] Password [email or web address] 05/09/2014, 2:19 PM

## 2014-05-10 DIAGNOSIS — K5190 Ulcerative colitis, unspecified, without complications: Secondary | ICD-10-CM | POA: Diagnosis not present

## 2014-05-10 DIAGNOSIS — Z800 Family history of malignant neoplasm of digestive organs: Secondary | ICD-10-CM | POA: Diagnosis not present

## 2014-05-10 DIAGNOSIS — K449 Diaphragmatic hernia without obstruction or gangrene: Secondary | ICD-10-CM | POA: Diagnosis present

## 2014-05-10 DIAGNOSIS — K529 Noninfective gastroenteritis and colitis, unspecified: Secondary | ICD-10-CM | POA: Diagnosis not present

## 2014-05-10 DIAGNOSIS — R0789 Other chest pain: Secondary | ICD-10-CM | POA: Diagnosis not present

## 2014-05-10 DIAGNOSIS — F3289 Other specified depressive episodes: Secondary | ICD-10-CM | POA: Diagnosis present

## 2014-05-10 DIAGNOSIS — E785 Hyperlipidemia, unspecified: Secondary | ICD-10-CM | POA: Diagnosis present

## 2014-05-10 DIAGNOSIS — F329 Major depressive disorder, single episode, unspecified: Secondary | ICD-10-CM | POA: Diagnosis present

## 2014-05-10 DIAGNOSIS — R197 Diarrhea, unspecified: Secondary | ICD-10-CM | POA: Diagnosis not present

## 2014-05-10 DIAGNOSIS — R112 Nausea with vomiting, unspecified: Secondary | ICD-10-CM | POA: Diagnosis not present

## 2014-05-10 DIAGNOSIS — E669 Obesity, unspecified: Secondary | ICD-10-CM | POA: Diagnosis not present

## 2014-05-10 DIAGNOSIS — K5730 Diverticulosis of large intestine without perforation or abscess without bleeding: Secondary | ICD-10-CM | POA: Diagnosis present

## 2014-05-10 DIAGNOSIS — R109 Unspecified abdominal pain: Secondary | ICD-10-CM | POA: Diagnosis not present

## 2014-05-10 DIAGNOSIS — R072 Precordial pain: Secondary | ICD-10-CM | POA: Diagnosis present

## 2014-05-10 DIAGNOSIS — R12 Heartburn: Secondary | ICD-10-CM | POA: Diagnosis not present

## 2014-05-10 DIAGNOSIS — M329 Systemic lupus erythematosus, unspecified: Secondary | ICD-10-CM | POA: Diagnosis not present

## 2014-05-10 DIAGNOSIS — K219 Gastro-esophageal reflux disease without esophagitis: Secondary | ICD-10-CM | POA: Diagnosis not present

## 2014-05-10 DIAGNOSIS — Z79899 Other long term (current) drug therapy: Secondary | ICD-10-CM | POA: Diagnosis not present

## 2014-05-10 DIAGNOSIS — K2100 Gastro-esophageal reflux disease with esophagitis, without bleeding: Secondary | ICD-10-CM | POA: Diagnosis not present

## 2014-05-10 DIAGNOSIS — Z66 Do not resuscitate: Secondary | ICD-10-CM | POA: Diagnosis present

## 2014-05-10 DIAGNOSIS — E860 Dehydration: Secondary | ICD-10-CM | POA: Diagnosis present

## 2014-05-10 DIAGNOSIS — Z6838 Body mass index (BMI) 38.0-38.9, adult: Secondary | ICD-10-CM | POA: Diagnosis not present

## 2014-05-10 DIAGNOSIS — K5289 Other specified noninfective gastroenteritis and colitis: Secondary | ICD-10-CM | POA: Diagnosis not present

## 2014-05-10 DIAGNOSIS — I10 Essential (primary) hypertension: Secondary | ICD-10-CM | POA: Diagnosis present

## 2014-05-10 LAB — BASIC METABOLIC PANEL
Anion gap: 9 (ref 5–15)
BUN: 8 mg/dL (ref 6–23)
CHLORIDE: 103 meq/L (ref 96–112)
CO2: 27 mEq/L (ref 19–32)
CREATININE: 0.6 mg/dL (ref 0.50–1.10)
Calcium: 8.5 mg/dL (ref 8.4–10.5)
GFR calc non Af Amer: >90 mL/min (ref >90)
Glucose, Bld: 109 mg/dL — ABNORMAL HIGH (ref 70–99)
Potassium: 3.4 mEq/L — ABNORMAL LOW (ref 3.7–5.3)
Sodium: 139 mEq/L (ref 137–147)

## 2014-05-10 LAB — CBC
HCT: 34.6 % — ABNORMAL LOW (ref 36.0–46.0)
HEMOGLOBIN: 11.2 g/dL — AB (ref 12.0–15.0)
MCH: 25.7 pg — AB (ref 26.0–34.0)
MCHC: 32.4 g/dL (ref 30.0–36.0)
MCV: 79.5 fL (ref 78.0–100.0)
Platelets: 276 10*3/uL (ref 150–400)
RBC: 4.35 MIL/uL (ref 3.87–5.11)
RDW: 15.6 % — ABNORMAL HIGH (ref 11.5–15.5)
WBC: 3.5 10*3/uL — ABNORMAL LOW (ref 4.0–10.5)

## 2014-05-10 LAB — OCCULT BLOOD X 1 CARD TO LAB, STOOL: FECAL OCCULT BLD: NEGATIVE

## 2014-05-10 MED ORDER — SODIUM CHLORIDE 0.9 % IV SOLN
INTRAVENOUS | Status: DC
  Administered 2014-05-11 – 2014-05-12 (×2): via INTRAVENOUS

## 2014-05-10 MED ORDER — POLYVINYL ALCOHOL 1.4 % OP SOLN
1.0000 [drp] | OPHTHALMIC | Status: DC | PRN
  Administered 2014-05-10: 1 [drp] via OPHTHALMIC
  Filled 2014-05-10: qty 15

## 2014-05-10 MED ORDER — POTASSIUM CHLORIDE CRYS ER 20 MEQ PO TBCR
40.0000 meq | EXTENDED_RELEASE_TABLET | Freq: Once | ORAL | Status: AC
  Administered 2014-05-10: 40 meq via ORAL
  Filled 2014-05-10: qty 2

## 2014-05-10 MED ORDER — OXYCODONE-ACETAMINOPHEN 5-325 MG PO TABS
1.0000 | ORAL_TABLET | Freq: Four times a day (QID) | ORAL | Status: DC | PRN
  Administered 2014-05-10 – 2014-05-12 (×5): 1 via ORAL
  Filled 2014-05-10 (×5): qty 1

## 2014-05-10 MED ORDER — TEMAZEPAM 15 MG PO CAPS
15.0000 mg | ORAL_CAPSULE | Freq: Every evening | ORAL | Status: DC | PRN
  Administered 2014-05-10 – 2014-05-11 (×2): 15 mg via ORAL
  Filled 2014-05-10 (×2): qty 1

## 2014-05-10 NOTE — Progress Notes (Signed)
[facility] PROGRESS NOTE  Erin Wolf EPP:[id number] DOB: [birth year] DOA: 05/09/2014 PCP: Shirline Frees, MD  Interim Summary Patient is a pleasant 58 year old female with a past medical history of ulcerative colitis who was previously on mesalamine suppositories as needed, presented to the emergency department at [facility] on 05/09/2014  with complaints of abdominal pain associated with nausea vomiting and diarrhea. Patient was seen and evaluated by Dr. Michail Sermon of gastroenterology who recommended patient be hospitalized, started on IV fluids and empiric Ciprofloxacin/Flagyl. The following morning patient noted ongoing abdominal pain however had resolution to her nausea and vomiting. She denies bloody stools.                                                                                                                                                                                                                                           Assessment/Plan: 1. Ulcerative Colitis Flareup versus Acute Gastroenteritis -Patient complains of ongoing abdominal pain.  -CT scan of abdomen and pelvis did not show acute changes involving the abdomen or pelvis. -GI following, suggested holding IV steroids for now -Continue empiric IV antimicrobial therapy with ciprofloxacin and Flagyl. -Plan for EGD and Flex/sig tomorrow.  -Pending stool studies, including stool for Cdiff.   2. Retrosternal Chest Pain -Likely GI in origin as she describes her pain as burning, constant, possibly aggravated by vomiting. -Continue proton pump inhibitor therapy with Protonix 40 mg twice a day.  -Troponin was negative on admission. Will obtain a second troponin level today to ensure is within normal limits.  3. Hypertension. -Blood pressures are stable, will discontinue hydrochlorothiazide as I am concerned for precipitating dehydration given multiple episodes of nausea vomiting as well as patient to be  made n.p.o. for procedure tomorrow  Code Status: Full code Family Communication:  Disposition Plan: Supportive Care, plan for EGD and Flex/Sig in am   Consultants:  GI  Antibiotics:  Ciprofloxacin (started on 05/09/2014)  Flagyl (started on 05/09/2014)  HPI/Subjective: Patient complaining of ongoing abdominal pain, denies further episodes of nausea vomiting.  Objective: Filed Vitals:   05/10/14 0614  BP: 113/65  Pulse: 74  Temp: 97.6 F (36.4 C)  Resp: 18    Intake/Output Summary (Last 24 hours) at 05/10/14 1422 Last data filed at 05/09/14 1905  Gross per 24 hour  Intake      0 ml  Output  600 ml  Net   -600 ml   Filed Weights   05/09/14 1434  Weight: 104.5 kg (230 lb 6.1 oz)    Exam:   General:  No acute distress, she is awake and alert  Cardiovascular: Regular rate and rhythm, normal S1S2  Respiratory: Clear to auscultation bilaterally, normal S1W2  Abdomen: She has mild to moderate generalized pian to palpation, no rebound tenderness or guarding  Musculoskeletal: No edema   Data Reviewed: Basic Metabolic Panel:  Recent Labs Lab 05/09/14 1000 05/10/14 0525  NA 139 139  K 3.7 3.4*  CL 100 103  CO2 25 27  GLUCOSE 108* 109*  BUN 12 8  CREATININE 0.56 0.60  CALCIUM 10.0 8.5   Liver Function Tests:  Recent Labs Lab 05/09/14 1000  AST 18  ALT 33  ALKPHOS 56  BILITOT 0.3  PROT 8.5*  ALBUMIN 3.8    Recent Labs Lab 05/09/14 1000  LIPASE 24   No results found for this basename: AMMONIA,  in the last 168 hours CBC:  Recent Labs Lab 05/09/14 1000 05/10/14 0525  WBC 4.0 3.5*  NEUTROABS 1.3*  --   HGB 12.7 11.2*  HCT 39.3 34.6*  MCV 78.4 79.5  PLT 338 276   Cardiac Enzymes: No results found for this basename: CKTOTAL, CKMB, CKMBINDEX, TROPONINI,  in the last 168 hours BNP (last 3 results) No results found for this basename: PROBNP,  in the last 8760 hours CBG: No results found for this basename: GLUCAP,  in the last 168  hours  No results found for this or any previous visit (from the past 240 hour(s)).   Studies: Ct Abdomen Pelvis W Contrast  05/09/2014   CLINICAL DATA:  Abdominal pain and diarrhea.  History of colitis.  EXAM: CT ABDOMEN AND PELVIS WITH CONTRAST  TECHNIQUE: Multidetector CT imaging of the abdomen and pelvis was performed using the standard protocol following bolus administration of intravenous contrast.  CONTRAST:  100 mL OMNIPAQUE IOHEXOL 300 MG/ML SOLN, 50 mL OMNIPAQUE IOHEXOL 300 MG/ML SOLN  COMPARISON:  CT abdomen and pelvis 05/27/2013. Plain films chest and abdomen earlier this same day.  FINDINGS: Minimal dependent atelectasis is seen in the lung bases. No pleural or pericardial effusion.  Minimal sigmoid diverticulosis is identified. The colon otherwise appears normal. The stomach and small bowel are unremarkable. The appendix is not discretely visualized and may have been removed. No evidence of inflammatory process is seen. The patient is status post hysterectomy.  The gallbladder has been removed. Low attenuation of the liver is consistent with fatty infiltration. The spleen, right adrenal gland, pancreas and kidneys appear normal. 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 There is no lymphadenopathy or fluid. No focal bony abnormality is identified. Degenerative disc disease L3-4, L4-5 and L5-S1 is noted.  IMPRESSION: No acute finding abdomen or pelvis.  Status post hysterectomy and cholecystectomy.  Fatty infiltration of the liver.  Mild diverticulosis without diverticulitis.   Electronically Signed   By: Inge Rise M.D.   On: 05/09/2014 11:34   Dg Abd Acute W/chest  05/09/2014   CLINICAL DATA:  Chest and abdominal pain for 3 days.  EXAM: ACUTE ABDOMEN SERIES (ABDOMEN 2 VIEW & CHEST 1 VIEW)  COMPARISON:  PA and lateral chest 01/04/2014. CT abdomen and pelvis 05/27/2013.  FINDINGS: Single view of the chest demonstrates clear lungs and normal heart size. No pneumothorax  or pleural effusion.  Two views of the abdomen show no free intraperitoneal air. The  bowel gas pattern is nonobstructive. No worrisome abdominal calcification is identified.  IMPRESSION: No acute finding chest or abdomen.   Electronically Signed   By: Inge Rise M.D.   On: 05/09/2014 09:37    Scheduled Meds: . antiseptic oral rinse  15 mL Mouth Rinse BID  . ciprofloxacin  400 mg Intravenous Q12H  . dicyclomine  20 mg Oral TID AC & HS  . enoxaparin (LOVENOX) injection  50 mg Subcutaneous Q24H  . FLUoxetine  20 mg Oral q morning - 10a  . levETIRAcetam  500 mg Oral q morning - 10a  . metronidazole  500 mg Intravenous Q8H  . multivitamin with minerals  1 tablet Oral Daily  . pantoprazole (PROTONIX) IV  40 mg Intravenous Q12H  . triamterene-hydrochlorothiazide  1 tablet Oral Daily   Continuous Infusions:   Principal Problem:   Gastroenteritis Active Problems:   HTN (hypertension), benign   Depression   Diarrhea   Dehydration   GERD (gastroesophageal reflux disease)   Chest pain, atypical   Chronic ulcerative colitis   Acute gastroenteritis    Time spent: 55 min    Kelvin Cellar  [facility] Pager [phone]. If 7PM-7AM, please contact night-coverage at [email or web address], password [facility] 05/10/2014, 2:22 PM  LOS: 1 day

## 2014-05-10 NOTE — Progress Notes (Signed)
INITIAL NUTRITION ASSESSMENT  DOCUMENTATION CODES Per approved criteria  -Obesity Unspecified   INTERVENTION: - Diet advancement per MD - Discussed diet therapy for nausea/vomiting and provided handouts with RD contact information - RD to continue to monitor   NUTRITION DIAGNOSIS: Inadequate oral intake related to inability to eat as evidenced by NPO.   Goal: Advance diet as tolerated to regular diet  Monitor:  Weights, labs, diet advancement  Reason for Assessment: Malnutrition screening tool   58 y.o. female  Admitting Dx: Gastroenteritis  ASSESSMENT: Pt discussed during multidisciplinary rounds. Pt with history of ulcerative colitis currently not on any medications, depression, hypertension, seizure disorder who presented to the ED with 3 episodes of vomiting along with 3 episodes of watery diarrhea mucus and scanty streaks of blood since yesterday morning. Patient reports feeling nauseous for almost one week and having a very poor appetite. She had a full meal on Sunday at home and shortly after which she had episodes of nausea with vomiting and diarrhea. She also reports diffuse abdominal crampy pain. She described the symptoms to be similar to her ulcerative colitis flare.  - Pt reports for 4 days prior to Sunday she had nausea and only intake was some ginger ale, crackers, toast, oatmeal and protein shakes - Thought she was ready for some food on Sunday and had some fried chicken and collard greens and then had vomiting and diarrhea - States when she was feeling better, her appetite was good, eating 2 meals/day of healthy foods and drinking 1 protein shake/day - Unsure of changes in weight however has been trying to lose weight by starting to work out more by walking and taking Zumba class on the weekend - Denies any nausea/vomiting/diarrhea today - Nutrition focused physical exam was WNL except for mild muscle wasting in upper arms which pt attributes to feeling weak  - Pt  c/o feeling like food is getting stuck in her esophagus which is new for her - GI following    Height: Ht Readings from Last 1 Encounters:  05/09/14 5\' 5"  (1.651 m)    Weight: Wt Readings from Last 1 Encounters:  05/09/14 230 lb 6.1 oz (104.5 kg)    Ideal Body Weight: 125 lbs  % Ideal Body Weight: 184%  Wt Readings from Last 10 Encounters:  05/09/14 230 lb 6.1 oz (104.5 kg)  05/30/13 233 lb 11.2 oz (106.006 kg)  05/30/13 233 lb 11.2 oz (106.006 kg)  05/16/13 220 lb (99.791 kg)  02/05/13 225 lb (102.059 kg)  01/24/13 227 lb (102.967 kg)  01/14/12 235 lb (106.595 kg)  01/07/12 233 lb 12.8 oz (106.051 kg)  10/21/11 220 lb (99.791 kg)    Usual Body Weight: 230 lbs   % Usual Body Weight: 100%  BMI:  Body mass index is 38.34 kg/(m^2). Class II obesity   Estimated Nutritional Needs: Kcal: 1600-1800 Protein: 60-80g Fluid: 1.6-1.8L/day   Skin: Intact   Diet Order: NPO  EDUCATION NEEDS: -Education needs addressed - discussed diet therapy for nausea/vomiting and provided handouts of this information    Intake/Output Summary (Last 24 hours) at 05/10/14 0952 Last data filed at 05/09/14 1905  Gross per 24 hour  Intake      0 ml  Output    600 ml  Net   -600 ml    Last BM: 7/20  Labs:   Recent Labs Lab 05/09/14 1000 05/10/14 0525  NA 139 139  K 3.7 3.4*  CL 100 103  CO2 25 27  BUN  12 8  CREATININE 0.56 0.60  CALCIUM 10.0 8.5  GLUCOSE 108* 109*    CBG (last 3)  No results found for this basename: GLUCAP,  in the last 72 hours  Scheduled Meds: . antiseptic oral rinse  15 mL Mouth Rinse BID  . ciprofloxacin  400 mg Intravenous Q12H  . dicyclomine  20 mg Oral TID AC & HS  . enoxaparin (LOVENOX) injection  50 mg Subcutaneous Q24H  . FLUoxetine  20 mg Oral q morning - 10a  . levETIRAcetam  500 mg Oral q morning - 10a  . metronidazole  500 mg Intravenous Q8H  . multivitamin with minerals  1 tablet Oral Daily  . pantoprazole (PROTONIX) IV  40 mg  Intravenous Q12H  . triamterene-hydrochlorothiazide  1 tablet Oral Daily    Continuous Infusions:   Past Medical History  Diagnosis Date  . Hypertension   . Ulcerative colitis   . Seizure disorder   . HTN (hypertension), benign 01/03/2012  . Depression 01/03/2012  . 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 Hyperlipidemia 01/03/2012  . Fibroids 01/03/2012  . Chest pain, exertional   . SLE (systemic lupus erythematosus)   . GERD (gastroesophageal reflux disease)   . Hepatitis C     "signs of; not active" (05/28/2013)  . Migraines     "a few times/yr" (05/28/2013)  . Seizure     "today; < 1 yr ago, they just happen; started having them in my 20's" (05/28/2013)  . Chronic lower back pain   . Anxiety     Past Surgical History  Procedure Laterality Date  . Cholecystectomy  1978  . Abdominal hysterectomy  1978    "partial" (05/28/2013)  . Bilateral oophorectomy Bilateral 1987  . Colonoscopy N/A 05/31/2013    Procedure: COLONOSCOPY;  Surgeon: Wonda Horner, MD;  Location: [facility] ENDOSCOPY;  Service: Endoscopy;  Laterality: N/A;    Carlis Stable MS, [place], LDN [phone] Pager [phone] Weekend/After Hours Pager

## 2014-05-10 NOTE — Progress Notes (Signed)
   Subjective: Erin Wolf reports substernal burning discomfort and cramping abdominal pain. She feels that the burning discomfort is worse. She states that the cramping abdominal pain is improved. Her nausea has also improved, and she denies emesis. She has not had a BM since her last BM in the ED. Denies fevers.  Objective: Vital signs in last 24 hours: Filed Vitals:   05/09/14 1434 05/09/14 2217 05/09/14 2306 05/10/14 0614  BP:  111/65  113/65  Pulse:  72  74  Temp:  97.5 F (36.4 C) 98 F (36.7 C) 97.6 F (36.4 C)  TempSrc:  Oral Oral Oral  Resp:  16  18  Height: 5\' 5"  (1.651 m)     Weight: 230 lb 6.1 oz (104.5 kg)     SpO2:  98%  98%   General: NAD  HEENT: [place]/AT, sclera anicteric Neck: supple  CV: RRR  Chest: CTAB, breathing comfortably  Abdominal: soft, moderate diffuse tenderness to palpation, nondistended Extremities: warm, well perfused  Neuro: no gross deficits   Lab Results: Basic Metabolic Panel:  Recent Labs Lab 05/09/14 1000 05/10/14 0525  NA 139 139  K 3.7 3.4*  CL 100 103  CO2 25 27  GLUCOSE 108* 109*  BUN 12 8  CREATININE 0.56 0.60  CALCIUM 10.0 8.5    CBC:  Recent Labs Lab 05/09/14 1000 05/10/14 0525  WBC 4.0 3.5*  NEUTROABS 1.3*  --   HGB 12.7 11.2*  HCT 39.3 34.6*  MCV 78.4 79.5  PLT 338 276    Medications: I have reviewed the patient's current medications. Scheduled Meds: . antiseptic oral rinse  15 mL Mouth Rinse BID  . ciprofloxacin  400 mg Intravenous Q12H  . dicyclomine  20 mg Oral TID AC & HS  . enoxaparin (LOVENOX) injection  50 mg Subcutaneous Q24H  . FLUoxetine  20 mg Oral q morning - 10a  . levETIRAcetam  500 mg Oral q morning - 10a  . metronidazole  500 mg Intravenous Q8H  . multivitamin with minerals  1 tablet Oral Daily  . pantoprazole (PROTONIX) IV  40 mg Intravenous Q12H  . triamterene-hydrochlorothiazide  1 tablet Oral Daily   PRN Meds:.alum & mag hydroxide-simeth, cyclobenzaprine, fentaNYL, ondansetron  (ZOFRAN) IV, ondansetron  Assessment/Plan: 58 year old woman with history of ulcerative colitis (previously on Bentyl and mesalamine suppository, has not been on any meds for about two months), GERD, Hep C presenting with nausea, vomiting, abdominal pain x 3 days. Differential diagnosis includes viral vs bacterial gastroenteritis, IBS, IBD, ischemic colitis. Suspect GERD with epigastric symptoms and dysphagia. Streaks of blood in stools may be 2/2 internal hemorrhoids.   -Continue supportive care (ice chips, sips of water, IVF, pain control) -Will check for celiac disease (TTG IgA, Quantitative IgA level pending) -Continue IV protonix 40mg  Q12 hours  -Awaiting stool sample for C. Diff PCR, stool pathogen panel  -Continue antibiotics  Jacques Earthly, MD 05/10/2014, 8:32 AM

## 2014-05-10 NOTE — Progress Notes (Signed)
Patient ID: Erin Wolf, female   DOB: [birth year], 58 y.o.   MRN: [id number] [facility] Gastroenterology Progress Note  Erin Wolf 58 y.o. [birth year]   Subjective: Continues to have abdominal pain diffusely. Heartburn continuing but somewhat better compared to yesterday. No BMs since in ER. More cheerful today.  Objective: Vital signs in last 24 hours: Filed Vitals:   05/10/14 0614  BP: 113/65  Pulse: 74  Temp: 97.6 F (36.4 C)  Resp: 18    Physical Exam: Gen: alert, no acute distress Abd: diffusely tender with guarding, soft, nondistended, +BS  Lab Results:  Recent Labs  05/09/14 1000 05/10/14 0525  NA 139 139  K 3.7 3.4*  CL 100 103  CO2 25 27  GLUCOSE 108* 109*  BUN 12 8  CREATININE 0.56 0.60  CALCIUM 10.0 8.5    Recent Labs  05/09/14 1000  AST 18  ALT 33  ALKPHOS 56  BILITOT 0.3  PROT 8.5*  ALBUMIN 3.8    Recent Labs  05/09/14 1000 05/10/14 0525  WBC 4.0 3.5*  NEUTROABS 1.3*  --   HGB 12.7 11.2*  HCT 39.3 34.6*  MCV 78.4 79.5  PLT 338 276   No results found for this basename: LABPROT, INR,  in the last 72 hours    Assessment/Plan: 58 yo with history of ulcerative colitis presenting with diarrhea, rectal bleeding, and abdominal pain along with heartburn. No BMs to send for stool studies since admit. Will do EGD to look for esophagitis tomorrow. Will also do flex sig to look for evidence of ulcerative colitis. Tap water enemas today. Clear liquids today. Continue IVFs and IV Abx. NPO p MN. EGD and flex sig tomorrow.   [place] C. 05/10/2014, 10:55 AM

## 2014-05-11 ENCOUNTER — Inpatient Hospital Stay (HOSPITAL_COMMUNITY): Payer: Medicare Other | Admitting: Certified Registered"

## 2014-05-11 ENCOUNTER — Encounter (HOSPITAL_COMMUNITY): Payer: Self-pay | Admitting: *Deleted

## 2014-05-11 ENCOUNTER — Encounter (HOSPITAL_COMMUNITY): Payer: Medicare Other | Admitting: Certified Registered"

## 2014-05-11 ENCOUNTER — Encounter (HOSPITAL_COMMUNITY)
Admission: EM | Disposition: A | Discharge status: Home / Self Care | Payer: Self-pay | Source: Home / Self Care | Attending: Family Medicine

## 2014-05-11 HISTORY — PX: FLEXIBLE SIGMOIDOSCOPY: SHX5431

## 2014-05-11 HISTORY — PX: ESOPHAGOGASTRODUODENOSCOPY (EGD) WITH PROPOFOL: SHX5813

## 2014-05-11 LAB — BASIC METABOLIC PANEL
Anion gap: 11 (ref 5–15)
BUN: 6 mg/dL (ref 6–23)
CALCIUM: 8.9 mg/dL (ref 8.4–10.5)
CO2: 27 mEq/L (ref 19–32)
Chloride: 101 mEq/L (ref 96–112)
Creatinine, Ser: 0.58 mg/dL (ref 0.50–1.10)
GFR calc Af Amer: >90 mL/min (ref >90)
GFR calc non Af Amer: >90 mL/min (ref >90)
GLUCOSE: 92 mg/dL (ref 70–99)
Potassium: 3.9 mEq/L (ref 3.7–5.3)
SODIUM: 139 meq/L (ref 137–147)

## 2014-05-11 LAB — CBC
HEMATOCRIT: 37.1 % (ref 36.0–46.0)
HEMOGLOBIN: 11.6 g/dL — AB (ref 12.0–15.0)
MCH: 25.1 pg — ABNORMAL LOW (ref 26.0–34.0)
MCHC: 31.3 g/dL (ref 30.0–36.0)
MCV: 80.3 fL (ref 78.0–100.0)
Platelets: 286 10*3/uL (ref 150–400)
RBC: 4.62 MIL/uL (ref 3.87–5.11)
RDW: 15.7 % — ABNORMAL HIGH (ref 11.5–15.5)
WBC: 3.3 10*3/uL — ABNORMAL LOW (ref 4.0–10.5)

## 2014-05-11 LAB — IGA: IgA: 336 mg/dL (ref 69–380)

## 2014-05-11 LAB — CLOSTRIDIUM DIFFICILE BY PCR: Toxigenic C. Difficile by PCR: NEGATIVE

## 2014-05-11 SURGERY — ESOPHAGOGASTRODUODENOSCOPY (EGD) WITH PROPOFOL
Anesthesia: Monitor Anesthesia Care

## 2014-05-11 MED ORDER — PROPOFOL 10 MG/ML IV BOLUS
INTRAVENOUS | Status: AC
  Filled 2014-05-11: qty 20

## 2014-05-11 MED ORDER — LIDOCAINE HCL (CARDIAC) 20 MG/ML IV SOLN
INTRAVENOUS | Status: DC | PRN
  Administered 2014-05-11: 50 mg via INTRAVENOUS

## 2014-05-11 MED ORDER — PROPOFOL 10 MG/ML IV BOLUS
INTRAVENOUS | Status: DC | PRN
  Administered 2014-05-11: 50 mg via INTRAVENOUS

## 2014-05-11 MED ORDER — LACTATED RINGERS IV SOLN
INTRAVENOUS | Status: DC
  Administered 2014-05-11: 14:00:00 via INTRAVENOUS

## 2014-05-11 MED ORDER — PROPOFOL INFUSION 10 MG/ML OPTIME
INTRAVENOUS | Status: DC | PRN
  Administered 2014-05-11: 120 ug/kg/min via INTRAVENOUS

## 2014-05-11 SURGICAL SUPPLY — 15 items

## 2014-05-11 NOTE — Transfer of Care (Signed)
Immediate Anesthesia Transfer of Care Note  Patient: Erin Wolf  Procedure(s) Performed: Procedure(s) with comments: ESOPHAGOGASTRODUODENOSCOPY (EGD) WITH PROPOFOL (N/A) - egd first [place] (N/A)  Patient Location: PACU  Anesthesia Type:MAC  Level of Consciousness: awake, alert  and oriented  Airway & Oxygen Therapy: Patient Spontanous Breathing and Patient connected to nasal cannula oxygen  Post-op Assessment: Report given to PACU RN and Post -op Vital signs reviewed and stable  Post vital signs: Reviewed and stable  Complications: No apparent anesthesia complications

## 2014-05-11 NOTE — Interval H&P Note (Signed)
History and Physical Interval Note:  05/11/2014 2:17 PM  Erin Wolf  has presented today for surgery, with the diagnosis of abd.pain/heartburn/ulcerative colitis  The various methods of treatment have been discussed with the patient and family. After consideration of risks, benefits and other options for treatment, the patient has consented to  Procedure(s) with comments: ESOPHAGOGASTRODUODENOSCOPY (EGD) WITH PROPOFOL (N/A) - egd first [employer] (N/A) as a surgical intervention .  The patient's history has been reviewed, patient examined, no change in status, stable for surgery.  I have reviewed the patient's chart and labs.  Questions were answered to the patient's satisfaction.     [place]unnell C.

## 2014-05-11 NOTE — Anesthesia Postprocedure Evaluation (Signed)
  Anesthesia Post-op Note  Patient: Erin Wolf  Procedure(s) Performed: Procedure(s) (LRB): ESOPHAGOGASTRODUODENOSCOPY (EGD) WITH PROPOFOL (N/A) FLEXIBLE SIGMOIDOSCOPY (N/A)  Patient Location: PACU  Anesthesia Type: MAC  Level of Consciousness: awake and alert   Airway and Oxygen Therapy: Patient Spontanous Breathing  Post-op Pain: mild  Post-op Assessment: Post-op Vital signs reviewed, Patient's Cardiovascular Status Stable, Respiratory Function Stable, Patent Airway and No signs of Nausea or vomiting  Last Vitals:  Filed Vitals:   05/11/14 1503  BP:   Pulse: 66  Temp:   Resp: 13    Post-op Vital Signs: stable   Complications: No apparent anesthesia complications

## 2014-05-11 NOTE — Op Note (Signed)
[facility] [place], [id number]   FLEXIBLE SIGMOIDOSCOPY PROCEDURE REPORT  PATIENT: Erin Wolf, Erin Wolf  MR#: [id number] BIRTHDATE: [birth year] , 12  yrs. old GENDER: Female ENDOSCOPIST: Wilford Corner, MD REFERRED BY:  hospital team PROCEDURE DATE:  05/11/2014 PROCEDURE:     Flexible Sigmoidoscopy with biopsies ASA CLASS:    Class III INDICATIONS: Abdominal pain; History of Ulcerative Colitis MEDICATIONS:    see Anesthesia Report  DESCRIPTION OF PROCEDURE:   Rectal exam unrevealing. Pediatric colonoscope inserted into a fair prepped colon and solid stool noted in the descending colon and distal transverse colon with normal appearing mucosa. The colonoscope advanced to the distal transverse colon. Normal appearing colonic mucosa. Few sigmoid diverticuli noted. Biopsies taken to send for histology. Retroflexion unrevealing.        COMPLICATIONS:  None  ENDOSCOPIC IMPRESSION: Sigmoid diverticulosis otherwise normal flex sig; Biopsies taken; NO endoscopic evidence of ulcerative colitis  RECOMMENDATIONS:  F/U on path; Start liquids and advance as tolerated   _______________________________ eSignedWilford Corner, MD 05/11/2014 3:18 PM

## 2014-05-11 NOTE — Progress Notes (Signed)
[facility] PROGRESS NOTE  Erin Wolf XNA:[id number] DOB: 08-29-1956 DOA: 05/09/2014 PCP: Shirline Frees, MD Per last progress note Interim Summary Patient is a pleasant 58 year old female with a past medical history of ulcerative colitis who was previously on mesalamine suppositories as needed, presented to the emergency department at [facility] on 05/09/2014  with complaints of abdominal pain associated with nausea vomiting and diarrhea. Patient was seen and evaluated by Dr. Michail Sermon of gastroenterology who recommended patient be hospitalized, started on IV fluids and empiric Ciprofloxacin/Flagyl. The following morning patient noted ongoing abdominal pain however had resolution to her nausea and vomiting. She denies bloody stools.                                                                                                                                                                                                                                           Assessment/Plan: 1. Ulcerative Colitis Flareup versus Acute Gastroenteritis -Plan for EGD and Flex/sig: pending -GI on board and managing  2. Retrosternal Chest Pain -Likely GI in origin  - Troponin negative on admission  3. Hypertension. -Blood pressures are stable - continue to monitor  Code Status: Full code Family Communication:  Disposition Plan: Supportive Care, plan for EGD and Flex/Sig   Consultants:  GI  Antibiotics:  Ciprofloxacin (started on 05/09/2014)  Flagyl (started on 05/09/2014)  HPI/Subjective: No new complaints. Still having some abdominal discomfort  Objective: Filed Vitals:   05/11/14 1540  BP: 144/84  Pulse: 139  Temp:   Resp: 14    Intake/Output Summary (Last 24 hours) at 05/11/14 1653 Last data filed at 05/11/14 1541  Gross per 24 hour  Intake 2856.25 ml  Output   1700 ml  Net 1156.25 ml   Filed Weights   05/09/14 1434  Weight: 104.5 kg (230 lb 6.1 oz)     Exam:   General:  No acute distress, she is awake and alert  Cardiovascular: Regular rate and rhythm, no murmurs  Respiratory: Clear to auscultation bilaterally,no wheezes  Abdomen: She has mild to moderate generalized pian to palpation, no rebound tenderness or guarding  Musculoskeletal: No edema   Data Reviewed: Basic Metabolic Panel:  Recent Labs Lab 05/09/14 1000 05/10/14 0525 05/11/14 0540  NA 139 139 139  K 3.7 3.4* 3.9  CL 100 103 101  CO2 25 27 27   GLUCOSE 108* 109* 92  BUN 12 8 6   CREATININE 0.56 0.60 0.58  CALCIUM 10.0 8.5 8.9   Liver Function Tests:  Recent Labs Lab 05/09/14 1000  AST 18  ALT 33  ALKPHOS 56  BILITOT 0.3  PROT 8.5*  ALBUMIN 3.8    Recent Labs Lab 05/09/14 1000  LIPASE 24   No results found for this basename: AMMONIA,  in the last 168 hours CBC:  Recent Labs Lab 05/09/14 1000 05/10/14 0525 05/11/14 0540  WBC 4.0 3.5* 3.3*  NEUTROABS 1.3*  --   --   HGB 12.7 11.2* 11.6*  HCT 39.3 34.6* 37.1  MCV 78.4 79.5 80.3  PLT 338 276 286   Cardiac Enzymes: No results found for this basename: CKTOTAL, CKMB, CKMBINDEX, TROPONINI,  in the last 168 hours BNP (last 3 results) No results found for this basename: PROBNP,  in the last 8760 hours CBG: No results found for this basename: GLUCAP,  in the last 168 hours  Recent Results (from the past 240 hour(s))  CLOSTRIDIUM DIFFICILE BY PCR     Status: None   Collection Time    05/10/14  4:48 PM      Result Value Ref Range Status   C difficile by pcr NEGATIVE  NEGATIVE Final   Comment: Performed at [facility]     Studies: No results found.  Scheduled Meds: . antiseptic oral rinse  15 mL Mouth Rinse BID  . ciprofloxacin  400 mg Intravenous Q12H  . dicyclomine  20 mg Oral TID AC & HS  . enoxaparin (LOVENOX) injection  50 mg Subcutaneous Q24H  . FLUoxetine  20 mg Oral q morning - 10a  . levETIRAcetam  500 mg Oral q morning - 10a  . metronidazole  500 mg  Intravenous Q8H  . multivitamin with minerals  1 tablet Oral Daily  . pantoprazole (PROTONIX) IV  40 mg Intravenous Q12H   Continuous Infusions: . sodium chloride 75 mL/hr at 05/11/14 0028  . lactated ringers      Principal Problem:   Gastroenteritis Active Problems:   HTN (hypertension), benign   Depression   Diarrhea   Dehydration   GERD (gastroesophageal reflux disease)   Chest pain, atypical   Chronic ulcerative colitis   Acute gastroenteritis    Time spent: 35 min    Daunte Oestreich, [facility]  [facility] Pager [phone]. If 7PM-7AM, please contact night-coverage at [email or web address], password [facility] 05/11/2014, 4:53 PM  LOS: 2 days

## 2014-05-11 NOTE — Op Note (Signed)
[facility] [place], [address]   ENDOSCOPY PROCEDURE REPORT  PATIENT: Erin, Wolf  MR#: [id number] BIRTHDATE: 02-01-1956 , 41  yrs. old GENDER: Female  ENDOSCOPIST: Wilford Corner, MD REFERRED BY:  PROCEDURE DATE:  05/11/2014 PROCEDURE:   EGD, diagnostic ASA CLASS:   Class III INDICATIONS:Heartburn.   history of GERD. MEDICATIONS: See Anesthesia Report.  TOPICAL ANESTHETIC:  DESCRIPTION OF PROCEDURE:   After the risks benefits and alternatives of the procedure were thoroughly explained, informed consent was obtained.  The Pentax Gastroscope [id number]  endoscope was introduced through the mouth and advanced to the second portion of the duodenum , limited by Without limitations.   The instrument was slowly withdrawn as the mucosa was fully examined.     FINDINGS: The endoscope was inserted into the oropharynx and esophagus was intubated.  The gastroesophageal junction was noted to be 40 cm from the incisors. The distal esophagus was noted to have areas of white plaques and desquamation noted and esophageal brushing to check for Candida done during withdrawal.  Endoscope was advanced into the stomach, which revealed a focal erosion.  The endoscope was advanced to the duodenal bulb and second portion of duodenum which were unremarkable.  The endoscope was withdrawn back into the stomach and retroflexion revealed a small hiatal hernia.  COMPLICATIONS: None  ENDOSCOPIC IMPRESSION:     1. Esophagitis - question Candida - s/p brushing 2. Small gastric erosion 3. Small hiatal hernia  RECOMMENDATIONS: F/U on path   REPEAT EXAM: N/A  _______________________________ Wilford Corner, MD eSigned:  Wilford Corner, MD 05/11/2014 3:12 PM    CC:  PATIENT NAME:  Erin, Wolf MR#: [id number]

## 2014-05-11 NOTE — H&P (View-Only) (Signed)
Patient ID: Erin Wolf, female   DOB: 1955/12/07, 58 y.o.   MRN: [id number] [facility] Gastroenterology Progress Note  Erin Wolf 58 y.o. [birth year]   Subjective: Continues to have abdominal pain diffusely. Heartburn continuing but somewhat better compared to yesterday. No BMs since in ER. More cheerful today.  Objective: Vital signs in last 24 hours: Filed Vitals:   05/10/14 0614  BP: 113/65  Pulse: 74  Temp: 97.6 F (36.4 C)  Resp: 18    Physical Exam: Gen: alert, no acute distress Abd: diffusely tender with guarding, soft, nondistended, +BS  Lab Results:  Recent Labs  05/09/14 1000 05/10/14 0525  NA 139 139  K 3.7 3.4*  CL 100 103  CO2 25 27  GLUCOSE 108* 109*  BUN 12 8  CREATININE 0.56 0.60  CALCIUM 10.0 8.5    Recent Labs  05/09/14 1000  AST 18  ALT 33  ALKPHOS 56  BILITOT 0.3  PROT 8.5*  ALBUMIN 3.8    Recent Labs  05/09/14 1000 05/10/14 0525  WBC 4.0 3.5*  NEUTROABS 1.3*  --   HGB 12.7 11.2*  HCT 39.3 34.6*  MCV 78.4 79.5  PLT 338 276   No results found for this basename: LABPROT, INR,  in the last 72 hours    Assessment/Plan: 58 yo with history of ulcerative colitis presenting with diarrhea, rectal bleeding, and abdominal pain along with heartburn. No BMs to send for stool studies since admit. Will do EGD to look for esophagitis tomorrow. Will also do flex sig to look for evidence of ulcerative colitis. Tap water enemas today. Clear liquids today. Continue IVFs and IV Abx. NPO p MN. EGD and flex sig tomorrow.   [place] C. 05/10/2014, 10:55 AM

## 2014-05-11 NOTE — Anesthesia Preprocedure Evaluation (Signed)
Anesthesia Evaluation  Patient identified by MRN, date of birth, ID band Patient awake    Reviewed: Allergy & Precautions, H&P , NPO status , Patient's Chart, lab work & pertinent test results  Airway Mallampati: II TM Distance: >3 FB Neck ROM: Full    Dental no notable dental hx.    Pulmonary neg pulmonary ROS,  breath sounds clear to auscultation  Pulmonary exam normal       Cardiovascular hypertension, Pt. on medications Rhythm:Regular Rate:Normal     Neuro/Psych Seizures -, Well Controlled,  negative psych ROS   GI/Hepatic (+) Hepatitis -, CUC   Endo/Other  negative endocrine ROS  Renal/GU negative Renal ROS  negative genitourinary   Musculoskeletal   Abdominal   Peds negative pediatric ROS (+)  Hematology negative hematology ROS (+)   Anesthesia Other Findings   Reproductive/Obstetrics negative OB ROS                           Anesthesia Physical Anesthesia Plan  ASA: III  Anesthesia Plan: MAC   Post-op Pain Management:    Induction:   Airway Management Planned: Natural Airway and Simple Face Mask  Additional Equipment:   Intra-op Plan:   Post-operative Plan:   Informed Consent: I have reviewed the patients History and Physical, chart, labs and discussed the procedure including the risks, benefits and alternatives for the proposed anesthesia with the patient or authorized representative who has indicated his/her understanding and acceptance.   Dental advisory given  Plan Discussed with: CRNA  Anesthesia Plan Comments:         Anesthesia Quick Evaluation

## 2014-05-12 ENCOUNTER — Encounter (HOSPITAL_COMMUNITY): Payer: Self-pay | Admitting: Gastroenterology

## 2014-05-12 LAB — TISSUE TRANSGLUTAMINASE, IGA: TISSUE TRANSGLUTAMINASE AB, IGA: 8 U/mL (ref <20)

## 2014-05-12 LAB — GI PATHOGEN PANEL BY PCR, STOOL
C DIFFICILE TOXIN A/B: NEGATIVE
CRYPTOSPORIDIUM BY PCR: NEGATIVE
Campylobacter by PCR: NEGATIVE
E COLI 0157 BY PCR: NEGATIVE
E coli (ETEC) LT/ST: NEGATIVE
E coli (STEC): NEGATIVE
G lamblia by PCR: NEGATIVE
Norovirus GI/GII: NEGATIVE
Rotavirus A by PCR: NEGATIVE
SALMONELLA BY PCR: NEGATIVE
Shigella by PCR: NEGATIVE

## 2014-05-12 MED ORDER — DICYCLOMINE HCL 20 MG PO TABS: 20.0000 mg | ORAL_TABLET | Freq: Three times a day (TID) | ORAL | Status: DC

## 2014-05-12 MED ORDER — PANTOPRAZOLE SODIUM 40 MG PO TBEC: 40.0000 mg | DELAYED_RELEASE_TABLET | Freq: Two times a day (BID) | ORAL | Status: DC

## 2014-05-12 MED ORDER — CIPROFLOXACIN HCL 500 MG PO TABS: 500.0000 mg | ORAL_TABLET | Freq: Two times a day (BID) | ORAL | Status: DC

## 2014-05-12 MED ORDER — METRONIDAZOLE 500 MG PO TABS: 500.0000 mg | ORAL_TABLET | Freq: Three times a day (TID) | ORAL | Status: DC

## 2014-05-12 MED ORDER — ONDANSETRON 4 MG PO TBDP: 4.0000 mg | ORAL_TABLET | Freq: Three times a day (TID) | ORAL | Status: DC | PRN

## 2014-05-12 MED ORDER — OXYCODONE-ACETAMINOPHEN 5-325 MG PO TABS: 1.0000 | ORAL_TABLET | Freq: Four times a day (QID) | ORAL | Status: DC | PRN

## 2014-05-12 NOTE — Discharge Summary (Signed)
Physician Discharge Summary  Erin Wolf NGE:[id number] DOB: 09-20-1956 DOA: 05/09/2014  PCP: Shirline Frees, MD  Admit date: 05/09/2014 Discharge date: 05/12/2014  Time spent: > 35 minutes  Recommendations for Outpatient Follow-up:  1. Please follow up with your gastroenterologist in 1-2 weeks or sooner should any new concerns arise.  Discharge Diagnoses:  Please refer to list below.  Discharge Condition: stable  Diet recommendation: bland diet/heart healthy  Filed Weights   05/09/14 1434  Weight: 104.5 kg (230 lb 6.1 oz)    History of present illness:  According to HPI: 58 year old obese female with history of ulcerative colitis currently not on any medications, with a normal colonoscopy in August 2014 when she was admitted for ulcerative colitis flareup like symptoms, depression, hypertension, seizure disorder who presented to the ED with 3 episodes of vomiting along with 3 episodes of watery diarrhea mucus and scanty streaks of blood since yesterday morning.   Hospital Course:  1. Ulcerative Colitis Flareup versus Acute Gastroenteritis - Pt to f/u with GI once discharged for biopsy results - She reports feeling improvement on antibiotics. As such will prescribe antibiotic regime to complete a full 10 day course. - Provide pain medication and antiemetics after discharging. - provide script for protonix - place order for f/u appointment with Dr. Michail Sermon  2. Retrosternal Chest Pain  - Likely GI in origin  - Troponin negative on admission   3. Hypertension.  - Given soft blood pressures will hold blood pressure medication on discharge.   Procedures:  EGD and colonoscopy  Consultations:  GI: Dr. Michail Sermon  Discharge Exam: Filed Vitals:   05/12/14 0752  BP: 132/69  Pulse: 73  Temp: 98.2 F (36.8 C)  Resp: 20    General: Pt in nad, alert and awake Cardiovascular: rrr, no mrg Respiratory: cta bl, no wheezes  Discharge Instructions You were cared for  by a hospitalist during your hospital stay. If you have any questions about your discharge medications or the care you received while you were in the hospital after you are discharged, you can call the unit and asked to speak with the hospitalist on call if the hospitalist that took care of you is not available. Once you are discharged, your primary care physician will handle any further medical issues. Please note that NO REFILLS for any discharge medications will be authorized once you are discharged, as it is imperative that you return to your primary care physician (or establish a relationship with a primary care physician if you do not have one) for your aftercare needs so that they can reassess your need for medications and monitor your lab values.      Discharge Instructions   Call MD for:  difficulty breathing, headache or visual disturbances    Complete by:  As directed      Call MD for:  persistant nausea and vomiting    Complete by:  As directed      Call MD for:  severe uncontrolled pain    Complete by:  As directed      Call MD for:  temperature >100.4    Complete by:  As directed      Diet - low sodium heart healthy    Complete by:  As directed      Discharge instructions    Complete by:  As directed   Please follow up with your GI physician in 1-2 weeks or sooner should any new concerns arise.     Discharge patient  Complete by:  As directed      Increase activity slowly    Complete by:  As directed             Medication List    STOP taking these medications       MAXZIDE-25 37.5-25 MG per tablet  Generic drug:  triamterene-hydrochlorothiazide      TAKE these medications       acetaminophen-codeine 300-30 MG per tablet  Commonly known as:  TYLENOL #3  Take 1 tablet by mouth every 8 (eight) hours as needed for moderate pain.     ciprofloxacin 500 MG tablet  Commonly known as:  CIPRO  Take 1 tablet (500 mg total) by mouth 2 (two) times daily.      cyclobenzaprine 10 MG tablet  Commonly known as:  FLEXERIL  Take 1 tablet by mouth every 8 (eight) hours as needed for muscle spasms.     dicyclomine 20 MG tablet  Commonly known as:  BENTYL  Take 1 tablet (20 mg total) by mouth 4 (four) times daily -  before meals and at bedtime.     FLUoxetine 20 MG capsule  Commonly known as:  PROZAC  Take 20 mg by mouth every morning.     levETIRAcetam 500 MG 24 hr tablet  Commonly known as:  KEPPRA XR  Take 500 mg by mouth every morning.     metroNIDAZOLE 500 MG tablet  Commonly known as:  FLAGYL  Take 1 tablet (500 mg total) by mouth 3 (three) times daily.     multivitamin with minerals Tabs tablet  Take 1 tablet by mouth daily.     ondansetron 4 MG disintegrating tablet  Commonly known as:  ZOFRAN ODT  Take 1 tablet (4 mg total) by mouth every 8 (eight) hours as needed for nausea or vomiting.     oxyCODONE-acetaminophen 5-325 MG per tablet  Commonly known as:  PERCOCET/ROXICET  Take 1 tablet by mouth every 6 (six) hours as needed for severe pain.     pantoprazole 40 MG tablet  Commonly known as:  PROTONIX  Take 1 tablet (40 mg total) by mouth 2 (two) times daily.       Allergies  Allergen Reactions  . Aspirin Nausea Only  . Darvocet [Propoxyphene N-Acetaminophen] Nausea And Vomiting  . Isometheptene-Apap-Dichloral Nausea Only  . Penicillins Hives  . Tape Hives  . Wellbutrin [Bupropion Hcl] Other (See Comments)    Insomnia and headaches  . Dilaudid [Hydromorphone Hcl] Itching, Nausea Only and Rash      The results of significant diagnostics from this hospitalization (including imaging, microbiology, ancillary and laboratory) are listed below for reference.    Significant Diagnostic Studies: Ct Abdomen Pelvis W Contrast  05/09/2014   CLINICAL DATA:  Abdominal pain and diarrhea.  History of colitis.  EXAM: CT ABDOMEN AND PELVIS WITH CONTRAST  TECHNIQUE: Multidetector CT imaging of the abdomen and pelvis was performed using  the standard protocol following bolus administration of intravenous contrast.  CONTRAST:  100 mL OMNIPAQUE IOHEXOL 300 MG/ML SOLN, 50 mL OMNIPAQUE IOHEXOL 300 MG/ML SOLN  COMPARISON:  CT abdomen and pelvis 05/27/2013. Plain films chest and abdomen earlier this same day.  FINDINGS: Minimal dependent atelectasis is seen in the lung bases. No pleural or pericardial effusion.  Minimal sigmoid diverticulosis is identified. The colon otherwise appears normal. The stomach and small bowel are unremarkable. The appendix is not discretely visualized and may have been removed. No evidence of inflammatory process is seen. The  patient is status post hysterectomy.  The gallbladder has been removed. Low attenuation of the liver is consistent with fatty infiltration. The spleen, right adrenal gland, pancreas and kidneys appear normal. 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 There is no lymphadenopathy or fluid. No focal bony abnormality is identified. Degenerative disc disease L3-4, L4-5 and L5-S1 is noted.  IMPRESSION: No acute finding abdomen or pelvis.  Status post hysterectomy and cholecystectomy.  Fatty infiltration of the liver.  Mild diverticulosis without diverticulitis.   Electronically Signed   By: Inge Rise M.D.   On: 05/09/2014 11:34   Dg Abd Acute W/chest  05/09/2014   CLINICAL DATA:  Chest and abdominal pain for 3 days.  EXAM: ACUTE ABDOMEN SERIES (ABDOMEN 2 VIEW & CHEST 1 VIEW)  COMPARISON:  PA and lateral chest 01/04/2014. CT abdomen and pelvis 05/27/2013.  FINDINGS: Single view of the chest demonstrates clear lungs and normal heart size. No pneumothorax or pleural effusion.  Two views of the abdomen show no free intraperitoneal air. The bowel gas pattern is nonobstructive. No worrisome abdominal calcification is identified.  IMPRESSION: No acute finding chest or abdomen.   Electronically Signed   By: Inge Rise M.D.   On: 05/09/2014 09:37    Microbiology: Recent Results  (from the past 240 hour(s))  CLOSTRIDIUM DIFFICILE BY PCR     Status: None   Collection Time    05/10/14  4:48 PM      Result Value Ref Range Status   C difficile by pcr NEGATIVE  NEGATIVE Final   Comment: Performed at [facility]: Basic Metabolic Panel:  Recent Labs Lab 05/09/14 1000 05/10/14 0525 05/11/14 0540  NA 139 139 139  K 3.7 3.4* 3.9  CL 100 103 101  CO2 25 27 27   GLUCOSE 108* 109* 92  BUN 12 8 6   CREATININE 0.56 0.60 0.58  CALCIUM 10.0 8.5 8.9   Liver Function Tests:  Recent Labs Lab 05/09/14 1000  AST 18  ALT 33  ALKPHOS 56  BILITOT 0.3  PROT 8.5*  ALBUMIN 3.8    Recent Labs Lab 05/09/14 1000  LIPASE 24   No results found for this basename: AMMONIA,  in the last 168 hours CBC:  Recent Labs Lab 05/09/14 1000 05/10/14 0525 05/11/14 0540  WBC 4.0 3.5* 3.3*  NEUTROABS 1.3*  --   --   HGB 12.7 11.2* 11.6*  HCT 39.3 34.6* 37.1  MCV 78.4 79.5 80.3  PLT 338 276 286   Cardiac Enzymes: No results found for this basename: CKTOTAL, CKMB, CKMBINDEX, TROPONINI,  in the last 168 hours BNP: BNP (last 3 results) No results found for this basename: PROBNP,  in the last 8760 hours CBG: No results found for this basename: GLUCAP,  in the last 168 hours     Signed:  Velvet Bathe  [facility] 05/12/2014, 11:38 AM

## 2014-05-12 NOTE — Consult Note (Signed)
Patient seen and examined with Dr. Krall. Agree with her findings. Refer to my consult note for my complete findings.  

## 2014-05-12 NOTE — Progress Notes (Signed)
Patient discharge home with daughter, alert and oriented, discharge instruction given patient verbalize understanding of discharge instructions given, patient in stable condition at this time

## 2014-06-21 ENCOUNTER — Emergency Department (HOSPITAL_COMMUNITY)
Admission: EM | Admit: 2014-06-21 | Discharge: 2014-06-21 | Disposition: A | Discharge status: Home / Self Care | Payer: Medicare Other | Attending: Emergency Medicine | Admitting: Emergency Medicine

## 2014-06-21 ENCOUNTER — Encounter (HOSPITAL_COMMUNITY): Payer: Self-pay | Admitting: Emergency Medicine

## 2014-06-21 DIAGNOSIS — Y9389 Activity, other specified: Secondary | ICD-10-CM | POA: Insufficient documentation

## 2014-06-21 DIAGNOSIS — S39012A Strain of muscle, fascia and tendon of lower back, initial encounter: Secondary | ICD-10-CM

## 2014-06-21 DIAGNOSIS — X500XXA Overexertion from strenuous movement or load, initial encounter: Secondary | ICD-10-CM | POA: Diagnosis not present

## 2014-06-21 DIAGNOSIS — G8929 Other chronic pain: Secondary | ICD-10-CM | POA: Diagnosis not present

## 2014-06-21 DIAGNOSIS — K219 Gastro-esophageal reflux disease without esophagitis: Secondary | ICD-10-CM | POA: Diagnosis not present

## 2014-06-21 DIAGNOSIS — F411 Generalized anxiety disorder: Secondary | ICD-10-CM | POA: Diagnosis not present

## 2014-06-21 DIAGNOSIS — Z792 Long term (current) use of antibiotics: Secondary | ICD-10-CM | POA: Diagnosis not present

## 2014-06-21 DIAGNOSIS — Y9289 Other specified places as the place of occurrence of the external cause: Secondary | ICD-10-CM | POA: Insufficient documentation

## 2014-06-21 DIAGNOSIS — Z8619 Personal history of other infectious and parasitic diseases: Secondary | ICD-10-CM | POA: Diagnosis not present

## 2014-06-21 DIAGNOSIS — IMO0002 Reserved for concepts with insufficient information to code with codable children: Secondary | ICD-10-CM | POA: Diagnosis not present

## 2014-06-21 DIAGNOSIS — F329 Major depressive disorder, single episode, unspecified: Secondary | ICD-10-CM | POA: Diagnosis not present

## 2014-06-21 DIAGNOSIS — S335XXA Sprain of ligaments of lumbar spine, initial encounter: Secondary | ICD-10-CM | POA: Insufficient documentation

## 2014-06-21 DIAGNOSIS — Z880 Allergy status to penicillin: Secondary | ICD-10-CM | POA: Insufficient documentation

## 2014-06-21 DIAGNOSIS — F3289 Other specified depressive episodes: Secondary | ICD-10-CM | POA: Insufficient documentation

## 2014-06-21 DIAGNOSIS — Z79899 Other long term (current) drug therapy: Secondary | ICD-10-CM | POA: Insufficient documentation

## 2014-06-21 DIAGNOSIS — I10 Essential (primary) hypertension: Secondary | ICD-10-CM | POA: Diagnosis not present

## 2014-06-21 DIAGNOSIS — G40909 Epilepsy, unspecified, not intractable, without status epilepticus: Secondary | ICD-10-CM | POA: Diagnosis not present

## 2014-06-21 DIAGNOSIS — Z8542 Personal history of malignant neoplasm of other parts of uterus: Secondary | ICD-10-CM | POA: Insufficient documentation

## 2014-06-21 MED ORDER — KETOROLAC TROMETHAMINE 60 MG/2ML IM SOLN
60.0000 mg | Freq: Once | INTRAMUSCULAR | Status: AC
  Administered 2014-06-21: 60 mg via INTRAMUSCULAR
  Filled 2014-06-21: qty 2

## 2014-06-21 MED ORDER — OXYCODONE-ACETAMINOPHEN 5-325 MG PO TABS: 1.0000 | ORAL_TABLET | Freq: Four times a day (QID) | ORAL | Status: DC | PRN

## 2014-06-21 MED ORDER — CYCLOBENZAPRINE HCL 10 MG PO TABS: 10.0000 mg | ORAL_TABLET | Freq: Three times a day (TID) | ORAL | Status: DC | PRN

## 2014-06-21 MED ORDER — PREDNISONE 50 MG PO TABS: 50.0000 mg | ORAL_TABLET | Freq: Every day | ORAL | Status: DC

## 2014-06-21 MED ORDER — MORPHINE SULFATE 4 MG/ML IJ SOLN
6.0000 mg | Freq: Once | INTRAMUSCULAR | Status: AC
  Administered 2014-06-21: 6 mg via INTRAMUSCULAR
  Filled 2014-06-21: qty 2

## 2014-06-21 NOTE — ED Notes (Signed)
Patient was educated not to drive, operate heavy machinery, or drink alcohol while taking narcotic medication.  

## 2014-06-21 NOTE — Discharge Instructions (Signed)
Return here as needed.  Followup with your primary care doctor. use ice and heat on your lower back

## 2014-06-21 NOTE — ED Provider Notes (Signed)
Medical screening examination/treatment/procedure(s) were performed by non-physician practitioner and as supervising physician I was immediately available for consultation/collaboration.   EKG Interpretation None       Threasa Beards, MD 06/21/14 [phone]

## 2014-06-21 NOTE — ED Provider Notes (Signed)
CSN: [id number]     Arrival date & time 06/21/14  0636 History   First MD Initiated Contact with Patient 06/21/14 [phone]     Chief Complaint  Patient presents with  . Back Pain  . Lupus     (Consider location/radiation/quality/duration/timing/severity/associated sxs/prior Treatment) HPI Patient presents to the emergency department with lower back pain that started on Saturday.  The patient, states she was moving some furniture when she felt a sudden twinge of pain in her lower back.  The patient, states she's had this pain prior.  She states this feels similar to previous, but just with increasing pain.  The patient denies numbness, weakness, headache, blurred vision, neck pain, numbness, fever, or dysuria, or incontinence.  The patient, states, that nothing seems to make her condition, better.  Palpation and movement make the pain, worse.  Patient, states she took Tylenol without relief of her symptoms Past Medical History  Diagnosis Date  . Hypertension   . Ulcerative colitis   . Seizure disorder   . HTN (hypertension), benign 01/03/2012  . Depression 01/03/2012  . 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 Hyperlipidemia 01/03/2012  . Fibroids 01/03/2012  . Chest pain, exertional   . SLE (systemic lupus erythematosus)   . GERD (gastroesophageal reflux disease)   . Hepatitis C     "signs of; not active" (05/28/2013)  . Migraines     "a few times/yr" (05/28/2013)  . Seizure     "today; < 1 yr ago, they just happen; started having them in my 20's" (05/28/2013)  . Chronic lower back pain   . Anxiety    Past Surgical History  Procedure Laterality Date  . Cholecystectomy  1978  . Abdominal hysterectomy  1978    "partial" (05/28/2013)  . Bilateral oophorectomy Bilateral 1987  . Colonoscopy N/A 05/31/2013    Procedure: COLONOSCOPY;  Surgeon: Wonda Horner, MD;  Location: [facility] ENDOSCOPY;  Service: Endoscopy;  Laterality: N/A;  . Esophagogastroduodenoscopy (egd) with propofol N/A 05/11/2014    Procedure:  ESOPHAGOGASTRODUODENOSCOPY (EGD) WITH PROPOFOL;  Surgeon: Lear Ng, MD;  Location: [facility] ENDOSCOPY;  Service: Endoscopy;  Laterality: N/A;  egd first  . Flexible sigmoidoscopy N/A 05/11/2014    Procedure: FLEXIBLE SIGMOIDOSCOPY;  Surgeon: Lear Ng, MD;  Location: [facility] ENDOSCOPY;  Service: Endoscopy;  Laterality: N/A;   History reviewed. No pertinent family history. History  Substance Use Topics  . Smoking status: Never Smoker   . Smokeless tobacco: Never Used  . Alcohol Use: Yes     Comment: 05/28/2013 "glass of wine rarely; weddings, etc"   OB History   Grav Para Term Preterm Abortions TAB SAB Ect Mult Living                 Review of Systems   All other systems negative except as documented in the HPI. All pertinent positives and negatives as reviewed in the HPI. Allergies  Aspirin; Darvocet; Isometheptene-apap-dichloral; Penicillins; Tape; Wellbutrin; and Dilaudid  Home Medications   Prior to Admission medications   Medication Sig Start Date End Date Taking? Authorizing Provider  acetaminophen-codeine (TYLENOL #3) 300-30 MG per tablet Take 1 tablet by mouth every 8 (eight) hours as needed for moderate pain.  04/19/14  Yes Historical Provider, MD  dicyclomine (BENTYL) 20 MG tablet Take 1 tablet (20 mg total) by mouth 4 (four) times daily -  before meals and at bedtime. 05/12/14  Yes Velvet Bathe, MD  FLUoxetine (PROZAC) 20 MG capsule Take 20 mg by mouth every morning.  Yes Historical Provider, MD  levETIRAcetam (KEPPRA XR) 500 MG 24 hr tablet Take 500 mg by mouth every morning.    Yes Historical Provider, MD  metroNIDAZOLE (FLAGYL) 500 MG tablet Take 500 mg by mouth 3 (three) times daily as needed.   Yes Historical Provider, MD  Multiple Vitamin (MULTIVITAMIN WITH MINERALS) TABS tablet Take 1 tablet by mouth daily.   Yes Historical Provider, MD  oxyCODONE-acetaminophen (PERCOCET/ROXICET) 5-325 MG per tablet Take 1 tablet by mouth every 6 (six) hours as needed for  severe pain. 05/12/14  Yes Velvet Bathe, MD  pantoprazole (PROTONIX) 40 MG tablet Take 1 tablet (40 mg total) by mouth 2 (two) times daily. 05/12/14  Yes Velvet Bathe, MD  Propylene Glycol (SYSTANE BALANCE OP) Apply 2 drops to eye 2 (two) times daily.   Yes Historical Provider, MD   BP 123/77  Pulse 94  Temp(Src) 97.8 F (36.6 C) (Oral)  Resp 20  SpO2 99% Physical Exam  Nursing note and vitals reviewed. Constitutional: She is oriented to person, place, and time. She appears well-developed and well-nourished. No distress.  HENT:  Head: Normocephalic and atraumatic.  Eyes: Pupils are equal, round, and reactive to light.  Neck: Normal range of motion. Neck supple.  Cardiovascular: Normal rate and regular rhythm.   Pulmonary/Chest: Effort normal and breath sounds normal.  Musculoskeletal:       Lumbar back: She exhibits tenderness, bony tenderness, pain and spasm. She exhibits normal range of motion, no swelling, no edema, no deformity and no laceration.  Neurological: She is alert and oriented to person, place, and time. She has normal reflexes. She exhibits normal muscle tone. Coordination normal.  Skin: Skin is warm and dry. No rash noted. No erythema.    ED Course  Procedures (including critical care time)  Patient be referred back to her primary care Dr. for further evaluation and care.  The patient does not have any new symptoms that are concerning with her back pain.  Patient can ambulate without difficulty other than pain.  The patient, states, that she has an appointment for September 8.  Brent General, PA-C 06/21/14 [phone]

## 2014-06-21 NOTE — ED Notes (Signed)
Patient is alert and oriented x3.  She is complaining of lower back pain that started Saturday. Additionally she has lupus and currently states that she aches all over.  She states that she has A follow up appointment sept 8 with her PCP but couldn't stand the pain.  Currently she rates her  Pain 10 of 10.

## 2014-06-30 ENCOUNTER — Encounter (HOSPITAL_COMMUNITY): Payer: Self-pay | Admitting: Emergency Medicine

## 2014-06-30 ENCOUNTER — Emergency Department (HOSPITAL_COMMUNITY)
Admission: EM | Admit: 2014-06-30 | Discharge: 2014-06-30 | Disposition: A | Discharge status: Home / Self Care | Payer: Medicare Other | Attending: Emergency Medicine | Admitting: Emergency Medicine

## 2014-06-30 ENCOUNTER — Other Ambulatory Visit (HOSPITAL_COMMUNITY): Payer: Medicare Other

## 2014-06-30 DIAGNOSIS — G8929 Other chronic pain: Secondary | ICD-10-CM | POA: Diagnosis not present

## 2014-06-30 DIAGNOSIS — G43909 Migraine, unspecified, not intractable, without status migrainosus: Secondary | ICD-10-CM | POA: Diagnosis not present

## 2014-06-30 DIAGNOSIS — F3289 Other specified depressive episodes: Secondary | ICD-10-CM | POA: Insufficient documentation

## 2014-06-30 DIAGNOSIS — Z79899 Other long term (current) drug therapy: Secondary | ICD-10-CM | POA: Diagnosis not present

## 2014-06-30 DIAGNOSIS — G40909 Epilepsy, unspecified, not intractable, without status epilepticus: Secondary | ICD-10-CM | POA: Diagnosis not present

## 2014-06-30 DIAGNOSIS — F329 Major depressive disorder, single episode, unspecified: Secondary | ICD-10-CM | POA: Insufficient documentation

## 2014-06-30 DIAGNOSIS — Z3202 Encounter for pregnancy test, result negative: Secondary | ICD-10-CM | POA: Diagnosis not present

## 2014-06-30 DIAGNOSIS — R109 Unspecified abdominal pain: Secondary | ICD-10-CM | POA: Diagnosis not present

## 2014-06-30 DIAGNOSIS — K51919 Ulcerative colitis, unspecified with unspecified complications: Secondary | ICD-10-CM

## 2014-06-30 DIAGNOSIS — Z8619 Personal history of other infectious and parasitic diseases: Secondary | ICD-10-CM | POA: Insufficient documentation

## 2014-06-30 DIAGNOSIS — Z90710 Acquired absence of both cervix and uterus: Secondary | ICD-10-CM | POA: Diagnosis not present

## 2014-06-30 DIAGNOSIS — K5190 Ulcerative colitis, unspecified, without complications: Secondary | ICD-10-CM | POA: Diagnosis not present

## 2014-06-30 DIAGNOSIS — F411 Generalized anxiety disorder: Secondary | ICD-10-CM | POA: Insufficient documentation

## 2014-06-30 DIAGNOSIS — IMO0002 Reserved for concepts with insufficient information to code with codable children: Secondary | ICD-10-CM | POA: Diagnosis not present

## 2014-06-30 DIAGNOSIS — Z8742 Personal history of other diseases of the female genital tract: Secondary | ICD-10-CM | POA: Diagnosis not present

## 2014-06-30 DIAGNOSIS — Z880 Allergy status to penicillin: Secondary | ICD-10-CM | POA: Diagnosis not present

## 2014-06-30 DIAGNOSIS — I10 Essential (primary) hypertension: Secondary | ICD-10-CM | POA: Insufficient documentation

## 2014-06-30 DIAGNOSIS — Z9089 Acquired absence of other organs: Secondary | ICD-10-CM | POA: Diagnosis not present

## 2014-06-30 DIAGNOSIS — R1084 Generalized abdominal pain: Secondary | ICD-10-CM | POA: Diagnosis not present

## 2014-06-30 LAB — COMPREHENSIVE METABOLIC PANEL
ALT: 19 U/L (ref 0–35)
AST: 20 U/L (ref 0–37)
Albumin: 4 g/dL (ref 3.5–5.2)
Alkaline Phosphatase: 55 U/L (ref 39–117)
Anion gap: 15 (ref 5–15)
BUN: 12 mg/dL (ref 6–23)
CALCIUM: 10 mg/dL (ref 8.4–10.5)
CO2: 26 mEq/L (ref 19–32)
CREATININE: 0.54 mg/dL (ref 0.50–1.10)
Chloride: 97 mEq/L (ref 96–112)
GFR calc Af Amer: >90 mL/min (ref >90)
GFR calc non Af Amer: >90 mL/min (ref >90)
Glucose, Bld: 85 mg/dL (ref 70–99)
Potassium: 4 mEq/L (ref 3.7–5.3)
SODIUM: 138 meq/L (ref 137–147)
Total Bilirubin: 0.3 mg/dL (ref 0.3–1.2)
Total Protein: 8.9 g/dL — ABNORMAL HIGH (ref 6.0–8.3)

## 2014-06-30 LAB — POC URINE PREG, ED: PREG TEST UR: NEGATIVE

## 2014-06-30 LAB — CBC WITH DIFFERENTIAL/PLATELET
BASOS ABS: 0 10*3/uL (ref 0.0–0.1)
Basophils Relative: 0 % (ref 0–1)
Eosinophils Absolute: 0 10*3/uL (ref 0.0–0.7)
Eosinophils Relative: 0 % (ref 0–5)
HEMATOCRIT: 41.3 % (ref 36.0–46.0)
Hemoglobin: 13.4 g/dL (ref 12.0–15.0)
Lymphocytes Relative: 56 % — ABNORMAL HIGH (ref 12–46)
Lymphs Abs: 2.8 10*3/uL (ref 0.7–4.0)
MCH: 25.9 pg — ABNORMAL LOW (ref 26.0–34.0)
MCHC: 32.4 g/dL (ref 30.0–36.0)
MCV: 79.7 fL (ref 78.0–100.0)
MONO ABS: 0.5 10*3/uL (ref 0.1–1.0)
Monocytes Relative: 10 % (ref 3–12)
NEUTROS ABS: 1.7 10*3/uL (ref 1.7–7.7)
Neutrophils Relative %: 34 % — ABNORMAL LOW (ref 43–77)
PLATELETS: 334 10*3/uL (ref 150–400)
RBC: 5.18 MIL/uL — ABNORMAL HIGH (ref 3.87–5.11)
RDW: 15.7 % — ABNORMAL HIGH (ref 11.5–15.5)
WBC: 5.1 10*3/uL (ref 4.0–10.5)

## 2014-06-30 LAB — LIPASE, BLOOD: LIPASE: 22 U/L (ref 11–59)

## 2014-06-30 LAB — URINALYSIS, ROUTINE W REFLEX MICROSCOPIC
Bilirubin Urine: NEGATIVE
Glucose, UA: NEGATIVE mg/dL
Hgb urine dipstick: NEGATIVE
Ketones, ur: NEGATIVE mg/dL
LEUKOCYTES UA: NEGATIVE
Nitrite: NEGATIVE
Protein, ur: NEGATIVE mg/dL
SPECIFIC GRAVITY, URINE: 1.015 (ref 1.005–1.030)
UROBILINOGEN UA: 0.2 mg/dL (ref 0.0–1.0)
pH: 6 (ref 5.0–8.0)

## 2014-06-30 MED ORDER — SODIUM CHLORIDE 0.9 % IV BOLUS (SEPSIS)
1000.0000 mL | Freq: Once | INTRAVENOUS | Status: AC
  Administered 2014-06-30: 1000 mL via INTRAVENOUS

## 2014-06-30 MED ORDER — IOHEXOL 300 MG/ML  SOLN: 50.0000 mL | Freq: Once | INTRAMUSCULAR | Status: DC | PRN

## 2014-06-30 MED ORDER — ONDANSETRON HCL 4 MG/2ML IJ SOLN
4.0000 mg | Freq: Once | INTRAMUSCULAR | Status: AC
Start: 2014-06-30 — End: 2014-06-30
  Administered 2014-06-30: 4 mg via INTRAVENOUS
  Filled 2014-06-30: qty 2

## 2014-06-30 MED ORDER — MORPHINE SULFATE 4 MG/ML IJ SOLN
4.0000 mg | Freq: Once | INTRAMUSCULAR | Status: AC
  Administered 2014-06-30: 4 mg via INTRAVENOUS
  Filled 2014-06-30: qty 1

## 2014-06-30 NOTE — ED Notes (Signed)
Patient attempting to use restroom for urine specimen

## 2014-06-30 NOTE — ED Provider Notes (Signed)
Medical screening examination/treatment/procedure(s) were performed by non-physician practitioner and as supervising physician I was immediately available for consultation/collaboration.   EKG Interpretation None        Pamella Pert, MD 06/30/14 1944

## 2014-06-30 NOTE — ED Notes (Signed)
Patient was educated not to drive, operate heavy machinery, or drink alcohol while taking narcotic medication.  

## 2014-06-30 NOTE — ED Notes (Addendum)
Patient has history of cholitis, states she has been having abdominal cramping and diarrhea since Monday. Has been using her medication as prescribed with no relief.

## 2014-06-30 NOTE — Discharge Instructions (Signed)
Please call your doctor for a followup appointment within 24-48 hours. When you talk to your doctor please let them know that you were seen in the emergency department and have them acquire all of your records so that they can discuss the findings with you and formulate a treatment plan to fully care for your new and ongoing problems. Please keep appointment with your primary care provider on 07/05/2014 Please keep appointment with your gastroenterologist on 07/11/2014 Please continue to take medications as prescribed by your provider's Please take with a clear liquid diet Please continue to monitor symptoms closely and if symptoms are to worsen or change (fever greater than 101, chills, sweating, nausea, vomiting, chest pain, shortness of breathe, difficulty breathing, weakness, numbness, tingling, worsening or changes to pain pattern, blood in the stools, black tarry stools, inability to keep any food fluids down) please report back to the Emergency Department immediately.    Abdominal Pain Many things can cause abdominal pain. Usually, abdominal pain is not caused by a disease and will improve without treatment. It can often be observed and treated at home. Your health care provider will do a physical exam and possibly order blood tests and X-rays to help determine the seriousness of your pain. However, in many cases, more time must pass before a clear cause of the pain can be found. Before that point, your health care provider may not know if you need more testing or further treatment. HOME CARE INSTRUCTIONS  Monitor your abdominal pain for any changes. The following actions may help to alleviate any discomfort you are experiencing:  Only take over-the-counter or prescription medicines as directed by your health care provider.  Do not take laxatives unless directed to do so by your health care provider.  Try a clear liquid diet (broth, tea, or water) as directed by your health care provider.  Slowly move to a bland diet as tolerated. SEEK MEDICAL CARE IF:  You have unexplained abdominal pain.  You have abdominal pain associated with nausea or diarrhea.  You have pain when you urinate or have a bowel movement.  You experience abdominal pain that wakes you in the night.  You have abdominal pain that is worsened or improved by eating food.  You have abdominal pain that is worsened with eating fatty foods.  You have a fever. SEEK IMMEDIATE MEDICAL CARE IF:   Your pain does not go away within 2 hours.  You keep throwing up (vomiting).  Your pain is felt only in portions of the abdomen, such as the right side or the left lower portion of the abdomen.  You pass bloody or black tarry stools. MAKE SURE YOU:  Understand these instructions.   Will watch your condition.   Will get help right away if you are not doing well or get worse.  Document Released: 07/17/2005 Document Revised: 10/12/2013 Document Reviewed: 06/16/2013 [facility] Patient Information 2015 [place], [place]. This information is not intended to replace advice given to you by your health care provider. Make sure you discuss any questions you have with your health care provider.

## 2014-06-30 NOTE — ED Provider Notes (Signed)
CSN: [id number]     Arrival date & time 06/30/14  1497 History   First MD Initiated Contact with Patient 06/30/14 1144     Chief Complaint  Patient presents with  . Abdominal Pain     (Consider location/radiation/quality/duration/timing/severity/associated sxs/prior Treatment) The history is provided by the patient. No language interpreter was used.  Erin Wolf is a 58 y/o F with PMHx of hypertension, ulcerative colitis, seizure, depression, hepatitis C, hyperlipidemia, fibroids, hyperlipidemia presenting to the ED with abdominal pain that started Monday. Patient reports that the discomfort is localized to the lower portion of the abdomen described as a cramping, labor discomfort. Reported that she's been having diarrhea-approximately 3-4 episodes per day-denied blood or mucus. Reported that she's been feeling nauseous with one episode of emesis today. Patient believes that she eats something wrong at the cookout that she had on Labor Day. Patient reported that she's been having intermittent fever-reported that last night she was approximately 102-1 74F. Stated that she's been able to tolerate soup, crackers, ginger ale. Stated that she has an appointment with her primary care provider Dr. Kenton Kingfisher on 07/05/2014 and an appointment with her primary gastroenterologist Dr. Tommie Raymond on 07/11/2014. Denied melena, hematochezia, pelvic pain, dysuria, hematuria, fainting, dizziness, chest pain, shortness of breath, difficulty breathing. PCP Dr. Kenton Kingfisher  Past Medical History  Diagnosis Date  . Hypertension   . Ulcerative colitis   . Seizure disorder   . HTN (hypertension), benign 01/03/2012  . Depression 01/03/2012  . 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 Hyperlipidemia 01/03/2012  . Fibroids 01/03/2012  . Chest pain, exertional   . SLE (systemic lupus erythematosus)   . GERD (gastroesophageal reflux disease)   . Hepatitis C     "signs of; not active" (05/28/2013)  . Migraines     "a few times/yr"  (05/28/2013)  . Seizure     "today; < 1 yr ago, they just happen; started having them in my 20's" (05/28/2013)  . Chronic lower back pain   . Anxiety    Past Surgical History  Procedure Laterality Date  . Cholecystectomy  1978  . Abdominal hysterectomy  1978    "partial" (05/28/2013)  . Bilateral oophorectomy Bilateral 1987  . Colonoscopy N/A 05/31/2013    Procedure: COLONOSCOPY;  Surgeon: Wonda Horner, MD;  Location: [facility] ENDOSCOPY;  Service: Endoscopy;  Laterality: N/A;  . Esophagogastroduodenoscopy (egd) with propofol N/A 05/11/2014    Procedure: ESOPHAGOGASTRODUODENOSCOPY (EGD) WITH PROPOFOL;  Surgeon: Lear Ng, MD;  Location: [facility] ENDOSCOPY;  Service: Endoscopy;  Laterality: N/A;  egd first  . Flexible sigmoidoscopy N/A 05/11/2014    Procedure: FLEXIBLE SIGMOIDOSCOPY;  Surgeon: Lear Ng, MD;  Location: [facility] ENDOSCOPY;  Service: Endoscopy;  Laterality: N/A;   History reviewed. No pertinent family history. History  Substance Use Topics  . Smoking status: Never Smoker   . Smokeless tobacco: Never Used  . Alcohol Use: Yes     Comment: 05/28/2013 "glass of wine rarely; weddings, etc"   OB History   Grav Para Term Preterm Abortions TAB SAB Ect Mult Living                 Review of Systems  Constitutional: Positive for fever. Negative for chills.  Respiratory: Negative for chest tightness and shortness of breath.   Cardiovascular: Negative for chest pain.  Gastrointestinal: Positive for nausea, vomiting, abdominal pain and diarrhea. Negative for constipation, blood in stool and anal bleeding.  Genitourinary: Negative for dysuria, decreased urine volume, vaginal bleeding, vaginal discharge and  vaginal pain.  Musculoskeletal: Negative for back pain and neck pain.  Neurological: Negative for dizziness, weakness and headaches.      Allergies  Aspirin; Darvocet; Isometheptene-apap-dichloral; Penicillins; Tape; Wellbutrin; and Dilaudid  Home Medications   Prior to  Admission medications   Medication Sig Start Date End Date Taking? Authorizing Provider  dicyclomine (BENTYL) 20 MG tablet Take 20 mg by mouth 4 (four) times daily -  before meals and at bedtime.   Yes Historical Provider, MD  FLUoxetine (PROZAC) 20 MG capsule Take 20 mg by mouth every morning.   Yes Historical Provider, MD  levETIRAcetam (KEPPRA XR) 500 MG 24 hr tablet Take 500 mg by mouth every morning.    Yes Historical Provider, MD  Multiple Vitamin (MULTIVITAMIN WITH MINERALS) TABS tablet Take 1 tablet by mouth daily.   Yes Historical Provider, MD  oxyCODONE-acetaminophen (PERCOCET/ROXICET) 5-325 MG per tablet Take 1 tablet by mouth every 6 (six) hours as needed for severe pain.   Yes Historical Provider, MD  Propylene Glycol (SYSTANE BALANCE OP) Apply 2 drops to eye 2 (two) times daily.   Yes Historical Provider, MD  triamterene-hydrochlorothiazide (MAXZIDE-25) 37.5-25 MG per tablet Take 1 tablet by mouth daily. 06/16/14  Yes Historical Provider, MD  acetaminophen-codeine (TYLENOL #3) 300-30 MG per tablet Take 1 tablet by mouth every 8 (eight) hours as needed for moderate pain.  04/19/14   Historical Provider, MD  cyclobenzaprine (FLEXERIL) 10 MG tablet Take 10 mg by mouth 3 (three) times daily as needed for muscle spasms.    Historical Provider, MD  predniSONE (DELTASONE) 50 MG tablet Take 50 mg by mouth daily with breakfast.    Historical Provider, MD   BP 130/74  Pulse 89  Temp(Src) 97.7 F (36.5 C) (Oral)  Resp 18  SpO2 98% Physical Exam  Nursing note and vitals reviewed. Constitutional: She is oriented to person, place, and time. She appears well-developed and well-nourished. No distress.  HENT:  Head: Normocephalic and atraumatic.  Mouth/Throat: Oropharynx is clear and moist. No oropharyngeal exudate.  Eyes: Conjunctivae and EOM are normal. Right eye exhibits no discharge. Left eye exhibits no discharge.  Neck: Normal range of motion. Neck supple. No tracheal deviation present.   Cardiovascular: Normal rate, regular rhythm and normal heart sounds.  Exam reveals no friction rub.   No murmur heard. Pulses:      Radial pulses are 2+ on the right side, and 2+ on the left side.       Dorsalis pedis pulses are 2+ on the right side, and 2+ on the left side.  Pulmonary/Chest: Effort normal and breath sounds normal. No respiratory distress. She has no wheezes. She has no rales.  Abdominal: Soft. Bowel sounds are normal. She exhibits no distension. There is tenderness. There is no rebound and no guarding.  Obese Bowel sounds normoactive in all 4 quadrants Abdomen soft upon palpation Diffuse tenderness upon palpation Negative rigidity or guarding noted Negative peritoneal signs  Musculoskeletal: Normal range of motion.  Full ROM to upper and lower extremities without difficulty noted, negative ataxia noted.  Lymphadenopathy:    She has no cervical adenopathy.  Neurological: She is alert and oriented to person, place, and time. She exhibits normal muscle tone. Coordination normal.  Skin: Skin is warm and dry. No rash noted. She is not diaphoretic. No erythema.  Psychiatric: She has a normal mood and affect. Her behavior is normal. Thought content normal.    ED Course  Procedures (including critical care time)  2:59 PM  This provider spoke with the patient. Patient reported that she does not want a CT scan of her abdomen and pelvis to be performed. Reported that she has an appointment with her GI on 07/05/2014 and stated that she will most likely get it done there.   4:38 PM This provider re-assessed the patient. Patient reported that her pain has improved. Patient tolerated crackers and gingerale PO.    CLINICAL DATA: Abdominal pain and diarrhea. History of colitis.  EXAM:  CT ABDOMEN AND PELVIS WITH CONTRAST  TECHNIQUE:  Multidetector CT imaging of the abdomen and pelvis was performed  using the standard protocol following bolus administration of  intravenous  contrast.  CONTRAST: 100 mL OMNIPAQUE IOHEXOL 300 MG/ML SOLN, 50 mL OMNIPAQUE  IOHEXOL 300 MG/ML SOLN  COMPARISON: CT abdomen and pelvis 05/27/2013. Plain films chest and  abdomen earlier this same day.  FINDINGS:  Minimal dependent atelectasis is seen in the lung bases. No pleural  or pericardial effusion.  Minimal sigmoid diverticulosis is identified. The colon otherwise  appears normal. The stomach and small bowel are unremarkable. The  appendix is not discretely visualized and may have been removed. No  evidence of inflammatory process is seen. The patient is status post  hysterectomy.  The gallbladder has been removed. Low attenuation of the liver is  consistent with fatty infiltration. The spleen, right adrenal gland,  pancreas and kidneys appear normal. 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 There is no lymphadenopathy or  fluid. No focal bony abnormality is identified. Degenerative disc  disease L3-4, L4-5 and L5-S1 is noted.  IMPRESSION:  No acute finding abdomen or pelvis.  Status post hysterectomy and cholecystectomy.  Fatty infiltration of the liver.  Mild diverticulosis without diverticulitis.  Electronically Signed  By: Inge Rise M.D.  On: 05/09/2014 11:34  Results for orders placed during the hospital encounter of 06/30/14  CBC WITH DIFFERENTIAL      Result Value Ref Range   WBC 5.1  4.0 - 10.5 K/uL   RBC 5.18 (*) 3.87 - 5.11 MIL/uL   Hemoglobin 13.4  12.0 - 15.0 g/dL   HCT 41.3  36.0 - 46.0 %   MCV 79.7  78.0 - 100.0 fL   MCH 25.9 (*) 26.0 - 34.0 pg   MCHC 32.4  30.0 - 36.0 g/dL   RDW 15.7 (*) 11.5 - 15.5 %   Platelets 334  150 - 400 K/uL   Neutrophils Relative % 34 (*) 43 - 77 %   Neutro Abs 1.7  1.7 - 7.7 K/uL   Lymphocytes Relative 56 (*) 12 - 46 %   Lymphs Abs 2.8  0.7 - 4.0 K/uL   Monocytes Relative 10  3 - 12 %   Monocytes Absolute 0.5  0.1 - 1.0 K/uL   Eosinophils Relative 0  0 - 5 %   Eosinophils Absolute 0.0  0.0 -  0.7 K/uL   Basophils Relative 0  0 - 1 %   Basophils Absolute 0.0  0.0 - 0.1 K/uL  COMPREHENSIVE METABOLIC PANEL      Result Value Ref Range   Sodium 138  137 - 147 mEq/L   Potassium 4.0  3.7 - 5.3 mEq/L   Chloride 97  96 - 112 mEq/L   CO2 26  19 - 32 mEq/L   Glucose, Bld 85  70 - 99 mg/dL   BUN 12  6 - 23 mg/dL   Creatinine, Ser 0.54  0.50 - 1.10 mg/dL  Calcium 10.0  8.4 - 10.5 mg/dL   Total Protein 8.9 (*) 6.0 - 8.3 g/dL   Albumin 4.0  3.5 - 5.2 g/dL   AST 20  0 - 37 U/L   ALT 19  0 - 35 U/L   Alkaline Phosphatase 55  39 - 117 U/L   Total Bilirubin 0.3  0.3 - 1.2 mg/dL   GFR calc non Af Amer >90  >90 mL/min   GFR calc Af Amer >90  >90 mL/min   Anion gap 15  5 - 15  LIPASE, BLOOD      Result Value Ref Range   Lipase 22  11 - 59 U/L  URINALYSIS, ROUTINE W REFLEX MICROSCOPIC      Result Value Ref Range   Color, Urine YELLOW  YELLOW   APPearance CLEAR  CLEAR   Specific Gravity, Urine 1.015  1.005 - 1.030   pH 6.0  5.0 - 8.0   Glucose, UA NEGATIVE  NEGATIVE mg/dL   Hgb urine dipstick NEGATIVE  NEGATIVE   Bilirubin Urine NEGATIVE  NEGATIVE   Ketones, ur NEGATIVE  NEGATIVE mg/dL   Protein, ur NEGATIVE  NEGATIVE mg/dL   Urobilinogen, UA 0.2  0.0 - 1.0 mg/dL   Nitrite NEGATIVE  NEGATIVE   Leukocytes, UA NEGATIVE  NEGATIVE  POC URINE PREG, ED      Result Value Ref Range   Preg Test, Ur NEGATIVE  NEGATIVE    Labs Review Labs Reviewed  CBC WITH DIFFERENTIAL - Abnormal; Notable for the following:    RBC 5.18 (*)    MCH 25.9 (*)    RDW 15.7 (*)    Neutrophils Relative % 34 (*)    Lymphocytes Relative 56 (*)    All other components within normal limits  COMPREHENSIVE METABOLIC PANEL - Abnormal; Notable for the following:    Total Protein 8.9 (*)    All other components within normal limits  LIPASE, BLOOD  URINALYSIS, ROUTINE W REFLEX MICROSCOPIC  POC URINE PREG, ED    Imaging Review No results found.   EKG Interpretation None      MDM   Final diagnoses:   Abdominal pain, unspecified abdominal location  Ulcerative colitis, unspecified complication   Medications  sodium chloride 0.9 % bolus 1,000 mL (1,000 mLs Intravenous New Bag/Given 06/30/14 1531)  morphine 4 MG/ML injection 4 mg (4 mg Intravenous Given 06/30/14 1531)  ondansetron (ZOFRAN) injection 4 mg (4 mg Intravenous Given 06/30/14 1531)   Filed Vitals:   06/30/14 1020 06/30/14 1316  BP: 140/78 130/74  Pulse: 114 89  Temp: 97.7 F (36.5 C)   TempSrc: Oral   Resp:  18  SpO2: 100% 98%   This provider reviewed patient's chart. Patient is a seen and assessed in ED setting on 05/09/2014 where patient was admitted for intractable nausea and vomiting. CT abdomen pelvis with contrast with negative acute abnormalities. CBC negative elevated white blood cell count-hemoglobin hematocrit within normal limits. CMP unremarkable. Lipase negative elevation. Urine pregnancy negative. Urinalysis negative for infection-negative hemoglobin, nitrites, leukocytes noted. CT abdomen and pelvis with contrast offered-patient declined. Reports she does not want imaging to be performed.  Patient given IV fluids and IV pain medications-pain controlled in ED setting. Patient tolerated fluids by mouth without difficulty-negative episodes of emesis in ED setting. Upon arrival to the emergency department patient's heart rate was 114 beats per minute this decreased to 89 beats per minute after fluids and pain control. Nonsurgical abdomen noted on exam. Suspicion of pain to  be due to ulcerative colitis. Recommended CT to be performed today, but patient declined. Patient stable, afebrile. Patient not septic appearing. Discharged patient. Discussed with patient to rest and stick with clear fluids. Discussed with patient to continue to take medications at home. Discussed with patient to keep appointment with primary care provider on 07/05/2014 gastroenterologist on 07/11/2014. Discussed with patient to closely monitor symptoms  and if symptoms are to worsen or change to report back to the ED - strict return instructions given.  Patient agreed to plan of care, understood, all questions answered.   Jamse Mead, PA-C 06/30/14 1749

## 2014-07-05 DIAGNOSIS — R51 Headache: Secondary | ICD-10-CM | POA: Diagnosis not present

## 2014-07-05 DIAGNOSIS — R569 Unspecified convulsions: Secondary | ICD-10-CM | POA: Diagnosis not present

## 2014-07-05 DIAGNOSIS — R109 Unspecified abdominal pain: Secondary | ICD-10-CM | POA: Diagnosis not present

## 2014-07-05 DIAGNOSIS — M549 Dorsalgia, unspecified: Secondary | ICD-10-CM | POA: Diagnosis not present

## 2014-08-17 ENCOUNTER — Emergency Department (HOSPITAL_COMMUNITY): Payer: Medicare Other

## 2014-08-17 ENCOUNTER — Emergency Department (HOSPITAL_COMMUNITY)
Admission: EM | Admit: 2014-08-17 | Discharge: 2014-08-17 | Disposition: A | Discharge status: Home / Self Care | Payer: Medicare Other | Attending: Emergency Medicine | Admitting: Emergency Medicine

## 2014-08-17 ENCOUNTER — Encounter (HOSPITAL_COMMUNITY): Payer: Self-pay | Admitting: Emergency Medicine

## 2014-08-17 DIAGNOSIS — K589 Irritable bowel syndrome without diarrhea: Secondary | ICD-10-CM | POA: Diagnosis not present

## 2014-08-17 DIAGNOSIS — Z8739 Personal history of other diseases of the musculoskeletal system and connective tissue: Secondary | ICD-10-CM | POA: Diagnosis not present

## 2014-08-17 DIAGNOSIS — R1110 Vomiting, unspecified: Secondary | ICD-10-CM | POA: Diagnosis not present

## 2014-08-17 DIAGNOSIS — Z9049 Acquired absence of other specified parts of digestive tract: Secondary | ICD-10-CM | POA: Diagnosis not present

## 2014-08-17 DIAGNOSIS — R109 Unspecified abdominal pain: Secondary | ICD-10-CM

## 2014-08-17 DIAGNOSIS — Z8742 Personal history of other diseases of the female genital tract: Secondary | ICD-10-CM | POA: Diagnosis not present

## 2014-08-17 DIAGNOSIS — R1010 Upper abdominal pain, unspecified: Secondary | ICD-10-CM | POA: Diagnosis not present

## 2014-08-17 DIAGNOSIS — G8929 Other chronic pain: Secondary | ICD-10-CM | POA: Insufficient documentation

## 2014-08-17 DIAGNOSIS — Z7952 Long term (current) use of systemic steroids: Secondary | ICD-10-CM | POA: Insufficient documentation

## 2014-08-17 DIAGNOSIS — G43909 Migraine, unspecified, not intractable, without status migrainosus: Secondary | ICD-10-CM | POA: Diagnosis not present

## 2014-08-17 DIAGNOSIS — Z8639 Personal history of other endocrine, nutritional and metabolic disease: Secondary | ICD-10-CM | POA: Diagnosis not present

## 2014-08-17 DIAGNOSIS — F419 Anxiety disorder, unspecified: Secondary | ICD-10-CM | POA: Diagnosis not present

## 2014-08-17 DIAGNOSIS — R112 Nausea with vomiting, unspecified: Secondary | ICD-10-CM | POA: Diagnosis not present

## 2014-08-17 DIAGNOSIS — F329 Major depressive disorder, single episode, unspecified: Secondary | ICD-10-CM | POA: Diagnosis not present

## 2014-08-17 DIAGNOSIS — K760 Fatty (change of) liver, not elsewhere classified: Secondary | ICD-10-CM | POA: Diagnosis not present

## 2014-08-17 DIAGNOSIS — Z79899 Other long term (current) drug therapy: Secondary | ICD-10-CM | POA: Insufficient documentation

## 2014-08-17 DIAGNOSIS — Z90710 Acquired absence of both cervix and uterus: Secondary | ICD-10-CM | POA: Diagnosis not present

## 2014-08-17 DIAGNOSIS — Z8619 Personal history of other infectious and parasitic diseases: Secondary | ICD-10-CM | POA: Diagnosis not present

## 2014-08-17 DIAGNOSIS — I10 Essential (primary) hypertension: Secondary | ICD-10-CM | POA: Diagnosis not present

## 2014-08-17 DIAGNOSIS — Z880 Allergy status to penicillin: Secondary | ICD-10-CM | POA: Insufficient documentation

## 2014-08-17 DIAGNOSIS — J029 Acute pharyngitis, unspecified: Secondary | ICD-10-CM

## 2014-08-17 DIAGNOSIS — R079 Chest pain, unspecified: Secondary | ICD-10-CM | POA: Diagnosis not present

## 2014-08-17 HISTORY — DX: Diverticulosis of intestine, part unspecified, without perforation or abscess without bleeding: K57.90

## 2014-08-17 HISTORY — DX: Unspecified abdominal pain: R10.9

## 2014-08-17 HISTORY — DX: Irritable bowel syndrome, unspecified: K58.9

## 2014-08-17 HISTORY — DX: Other chronic pain: G89.29

## 2014-08-17 LAB — COMPREHENSIVE METABOLIC PANEL
ALK PHOS: 62 U/L (ref 39–117)
ALT: 18 U/L (ref 0–35)
ANION GAP: 14 (ref 5–15)
AST: 17 U/L (ref 0–37)
Albumin: 3.8 g/dL (ref 3.5–5.2)
BILIRUBIN TOTAL: 0.3 mg/dL (ref 0.3–1.2)
BUN: 10 mg/dL (ref 6–23)
CO2: 27 meq/L (ref 19–32)
Calcium: 9.7 mg/dL (ref 8.4–10.5)
Chloride: 97 mEq/L (ref 96–112)
Creatinine, Ser: 0.65 mg/dL (ref 0.50–1.10)
GLUCOSE: 110 mg/dL — AB (ref 70–99)
POTASSIUM: 3.6 meq/L — AB (ref 3.7–5.3)
Sodium: 138 mEq/L (ref 137–147)
Total Protein: 8.5 g/dL — ABNORMAL HIGH (ref 6.0–8.3)

## 2014-08-17 LAB — URINALYSIS, ROUTINE W REFLEX MICROSCOPIC
Glucose, UA: NEGATIVE mg/dL
Hgb urine dipstick: NEGATIVE
Ketones, ur: NEGATIVE mg/dL
Leukocytes, UA: NEGATIVE
Nitrite: NEGATIVE
PH: 6 (ref 5.0–8.0)
Protein, ur: NEGATIVE mg/dL
Specific Gravity, Urine: 1.026 (ref 1.005–1.030)
Urobilinogen, UA: 0.2 mg/dL (ref 0.0–1.0)

## 2014-08-17 LAB — CBC WITH DIFFERENTIAL/PLATELET
Basophils Absolute: 0 10*3/uL (ref 0.0–0.1)
Basophils Relative: 0 % (ref 0–1)
EOS ABS: 0 10*3/uL (ref 0.0–0.7)
Eosinophils Relative: 0 % (ref 0–5)
HCT: 41.3 % (ref 36.0–46.0)
HEMOGLOBIN: 13.3 g/dL (ref 12.0–15.0)
LYMPHS ABS: 2.2 10*3/uL (ref 0.7–4.0)
Lymphocytes Relative: 47 % — ABNORMAL HIGH (ref 12–46)
MCH: 25.9 pg — AB (ref 26.0–34.0)
MCHC: 32.2 g/dL (ref 30.0–36.0)
MCV: 80.5 fL (ref 78.0–100.0)
MONOS PCT: 11 % (ref 3–12)
Monocytes Absolute: 0.5 10*3/uL (ref 0.1–1.0)
NEUTROS PCT: 42 % — AB (ref 43–77)
Neutro Abs: 2 10*3/uL (ref 1.7–7.7)
Platelets: 336 10*3/uL (ref 150–400)
RBC: 5.13 MIL/uL — AB (ref 3.87–5.11)
RDW: 15.1 % (ref 11.5–15.5)
WBC: 4.7 10*3/uL (ref 4.0–10.5)

## 2014-08-17 LAB — LIPASE, BLOOD: Lipase: 16 U/L (ref 11–59)

## 2014-08-17 LAB — TROPONIN I: Troponin I: <0.3 ng/mL (ref <0.30)

## 2014-08-17 LAB — POC OCCULT BLOOD, ED: FECAL OCCULT BLD: NEGATIVE

## 2014-08-17 MED ORDER — IOHEXOL 300 MG/ML  SOLN
100.0000 mL | Freq: Once | INTRAMUSCULAR | Status: AC | PRN
  Administered 2014-08-17: 100 mL via INTRAVENOUS

## 2014-08-17 MED ORDER — FAMOTIDINE IN NACL 20-0.9 MG/50ML-% IV SOLN
20.0000 mg | Freq: Once | INTRAVENOUS | Status: AC
  Administered 2014-08-17: 20 mg via INTRAVENOUS
  Filled 2014-08-17: qty 50

## 2014-08-17 MED ORDER — HYDROCODONE-ACETAMINOPHEN 5-325 MG PO TABS: ORAL_TABLET | ORAL | Status: DC

## 2014-08-17 MED ORDER — IOHEXOL 300 MG/ML  SOLN
50.0000 mL | Freq: Once | INTRAMUSCULAR | Status: AC | PRN
  Administered 2014-08-17: 50 mL via ORAL

## 2014-08-17 MED ORDER — PROMETHAZINE HCL 25 MG PO TABS: 25.0000 mg | ORAL_TABLET | Freq: Four times a day (QID) | ORAL | Status: DC | PRN

## 2014-08-17 MED ORDER — SODIUM CHLORIDE 0.9 % IV SOLN
INTRAVENOUS | Status: DC
  Administered 2014-08-17: 08:00:00 via INTRAVENOUS

## 2014-08-17 MED ORDER — MORPHINE SULFATE 4 MG/ML IJ SOLN
4.0000 mg | INTRAMUSCULAR | Status: AC | PRN
  Administered 2014-08-17 (×2): 4 mg via INTRAVENOUS
  Filled 2014-08-17 (×3): qty 1

## 2014-08-17 MED ORDER — GI COCKTAIL ~~LOC~~
30.0000 mL | Freq: Once | ORAL | Status: AC
  Administered 2014-08-17: 30 mL via ORAL
  Filled 2014-08-17: qty 30

## 2014-08-17 MED ORDER — ONDANSETRON HCL 4 MG/2ML IJ SOLN
4.0000 mg | INTRAMUSCULAR | Status: DC | PRN
Start: 2014-08-17 — End: 2014-08-17
  Administered 2014-08-17: 4 mg via INTRAVENOUS
  Filled 2014-08-17 (×2): qty 2

## 2014-08-17 NOTE — Discharge Instructions (Signed)
°Emergency Department Resource Guide °1) Find a Doctor and Pay Out of Pocket °Although you won't have to find out who is covered by your insurance plan, it is a good idea to ask around and get recommendations. You will then need to call the office and see if the doctor you have chosen will accept you as a new patient and what types of options they offer for patients who are self-pay. Some doctors offer discounts or will set up payment plans for their patients who do not have insurance, but you will need to ask so you aren't surprised when you get to your appointment. ° °2) Contact Your Local Health Department °Not all health departments have doctors that can see patients for sick visits, but many do, so it is worth a call to see if yours does. If you don't know where your local health department is, you can check in your phone book. The CDC also has a tool to help you locate your state's health department, and many state websites also have listings of all of their local health departments. ° °3) Find a Walk-in Clinic °If your illness is not likely to be very severe or complicated, you may want to try a walk in clinic. These are popping up all over the country in pharmacies, drugstores, and shopping centers. They're usually staffed by nurse practitioners or physician assistants that have been trained to treat common illnesses and complaints. They're usually fairly quick and inexpensive. However, if you have serious medical issues or chronic medical problems, these are probably not your best option. ° °No Primary Care Doctor: °- Call [facility] at  832-8000 - they can help you locate a primary care doctor that  accepts your insurance, provides certain services, etc. °- Physician Referral Service- 1-800-533-3463 ° °Chronic Pain Problems: °Organization         Address  Phone   Notes  °[facility]  (336) [phone] Patients need to be referred by their primary care doctor.  ° °Medication  Assistance: °Organization         Address  Phone   Notes  °[place] [facility] [address] °[place], [place] [address] (336) 641-8030 --Must be a resident of [place] °-- Must have NO insurance coverage whatsoever (no Medicaid/ Medicare, etc.) °-- The pt. MUST have a primary care doctor that directs their care regularly and follows them in the community °  °[facility]  (866) 331-1348   °[employer]  (888) 892-1162   ° °Agencies that provide inexpensive medical care: °Organization         Address  Phone   Notes  °[facility]  (336) 832-8035   °[facility]    (336) 832-7272   °[facility] [address] °[place], [place] [address] (336) 832-4777   °[facility] [address], °[place] (336) 271-4999   °[facility]    (336) 373-0678   °[facility]    (336) 272-1050   °[facility] ° [address], [place] Phone:  (336) 832-4444, Fax:  (336) 832-4440 Hours of Operation:  9 am - 6 pm, M-F.  Also accepts Medicaid/Medicare and self-pay.  °[facility] ° [address], [place] Phone: (336) [phone], Fax: (336) [phone]. Hours of Operation:  8:30 am - 5:30 pm, M-F.  Also accepts Medicaid and self-pay.  °[facility] [place] [address], [place] Phone: (336) 878-6027   °[facility] [address], [place], [place] (336)723-1848, Ext. 123 Mondays & Thursdays: 7-9 AM.  First 15 patients are seen on a first come, first serve basis. °  ° °Medicaid-accepting [place] Providers: ° °Organization         Address  Phone   Notes  °[facility] [address], [place] (336) 641-2100 Also accepts self-pay patients.  °[facility] [address], [place] ° (336) 856-9996   °[facility] [address], [place]  (336) 288-8857   °[facility] [address] [place] [address], [place] (336) 299-7000   °Veita Bland [address], [place]  ° (336) 373-1557 Only accepts [place] Access Medicaid patients after they have their name applied to their card.  ° °Self-Pay (no insurance) in [place]: ° °Organization         Address  Phone   Notes  °Sickle Cell Patients, [facility] [address], [place] (336) 832-1970   °[facility] [address], [place] (336) 832-4400   °[facility] ° [address], [place] (336) 992-4800   °[facility]/Dr. Osei-Bonsu ° [address] [place] [address], [place] or [address], [place] (336) [phone] Phone number for both [place] and [place] locations is the same.  °[facility] [address], [place] (336) 299-0000   °[facility] [address] [place] [address], [place] or [address] (336) 852-7530 °(336) 878-2260   °[facility] [address], [place] (336) 350-1642, phone; (336) 294-5005, fax Sees patients 1st and 3rd Saturday of every month.  Must not qualify for public or private insurance (i.e. Medicaid, Medicare, [place] Health Choice, Veterans' Benefits) • Household income should be no more than 200% of the poverty level •The clinic cannot treat you if you are pregnant or think you are pregnant • Sexually transmitted diseases are not treated at the clinic.  ° ° °Dental Care: °Organization         Address  Phone  Notes  °[place] [facility] [address], [place] (336) 641-6152 Accepts children up to age 21 who are enrolled in Medicaid or [place] Health Choice; pregnant women with a Medicaid card; and children who have applied for Medicaid or [place] Health Choice, but were declined, whose parents can pay a reduced fee at time of service.  °[place]  [facility] [place]  [address], [place] (336) 641-7733 Accepts children up to age 21 who are enrolled in Medicaid or  Health Choice; pregnant women with a Medicaid card; and children who have applied for Medicaid or  Health Choice, but were declined, whose parents can pay a reduced fee at time of service.  °[facility] ° [address],  (336) 641-4533 Patients are seen by appointment only. Walk-ins are not accepted. [facility] will see patients 18 years of age and older. °Monday - Tuesday (8am-5pm) °Most Wednesdays (8:30-5pm) °$30 per visit, cash only  °[facility] ° [address], [place] (336) 641-4533 Patients are seen by appointment only. Walk-ins are not accepted. [facility] will see patients 18 years of age and older. °One   Wednesday Evening (Monthly: Volunteer Based).  $30 per visit, cash only  °[facility]  (919) 537-3737 for adults; Children under age 4, call Graduate Pediatric Dentistry at ([phone]) [phone]. Children aged 4-14, please call ([phone]) [phone] to request a pediatric application. ° Dental services are provided in all areas of dental care including fillings, crowns and bridges, complete and partial dentures, implants, gum treatment, root canals, and extractions. Preventive care is also provided. Treatment is provided to both adults and children. °Patients are selected via a lottery and there is often a waiting list. °  °[facility] [address], °[place] ° (336) 763-8833 [email or web address] °  °[facility] [address], [place], [place] (336)723-1848, Ext. 123 Second and Fourth Thursday of each month, opens at 6:30 AM; Clinic ends at 9 AM.  Patients are seen on a first-come first-served basis, and a limited number are seen during each clinic.  ° °[facility] ° [address], [place], [place] (336) 723-7904    Eligibility Requirements °You must have lived in [place], [place], or [place] counties for at least the last three months. °  You cannot be eligible for state or federal sponsored healthcare insurance, including Veterans Administration, Medicaid, or Medicare. °  You generally cannot be eligible for healthcare insurance through your employer.  °  How to apply: °Eligibility screenings are held every Tuesday and Wednesday afternoon from 1:00 pm until 4:00 pm. You do not need an appointment for the interview!  °[facility] [address], [place], [place] 336-631-2330   °[facility]  336-342-8273   °[facility]  336-703-3100   °[facility]  336-570-6415   ° °Behavioral Health Resources in the Community: °Intensive Outpatient Programs °Organization         Address  Phone  Notes  °[facility] [address], [place], [place] 336-878-6098   °[facility] [address], [place], [place] 336-832-9800   °[facility] [address], [place], [place] ° 336-882-2125   °[facility] [address],  °[place], [place] 1-800-853-5163 or 336-641-4981   °Substance Abuse Resources °Organization         Address  Phone  Notes  °[facility]  336-882-2125   °[facility]  336-784-9470   °[facility]  336-285-9073   °[facility]  336-845-3988   °Residential & Outpatient Substance Abuse Program  1-800-659-3381   °Psychological Services °Organization         Address  Phone  Notes  °[facility]  336- 832-9600   °[facility]  336- 378-7881   °[facility] [address], [place] 1-800-853-5163 or 336-641-4981   ° °Mobile Crisis Teams °Organization         Address  Phone  Notes  °[facility], Mobile Crisis Care Unit  1-877-626-1772   °Assertive °[facility] ° [address]  [place], [place] 336-834-9664   °Sharon DeEsch [address] °[place] 336-554-5454   ° °Self-Help/Support Groups °Organization         Address  Phone             Notes  °[facility] [place] - variety of support groups  336- 373-1402 Call for more information  °Narcotics Anonymous (NA), [facility] [address], °[place]  2 meetings at this location  ° °  Residential Treatment Programs Organization         Address  Phone  Notes  [facility] [address]    [place]  1-3061835034   [facility]  [address] [place] [address], [place], [place]   [facility] [place], [place] [phone] Admissions: 8am-3pm M-F  [facility] [address],    [place], [place] 400-867-6195   [facility] [address] [place], [place], [place]   [facility] [address],  [place], [place]   [facility] - Intensive Outpatient [address], [place], [place]   [facility] [address]  [place], [place] 1-404 269 8005 or 415-020-7287   [facility] [address], [place], [place] Accepts Medicaid  [facility] [address]  [place] 1-458-765-1710 Substance Abuse/Addiction Treatment   [facility] Organization         Address  Phone  Notes  [facility]  [phone]   Domenic Schwab, PhD [address] Arlis Porta [place], [place]   910-215-4067 or 671-735-8587   [facility] [place], [place] (760) 469-1392   [facility] [address] [place], [place] (781) 011-8567 Insurance/Medicaid/sponsorship through [facility] [address]                                    [place], [place] (864)[phone] [facility] [address], [place] [phone]    Dr. Adele Schilder  (440)[phone]   [facility] Dept. 1) [address] [place] 2) [place] 3)  [address] [address], [place] 704-822-8163 ([phone]) [phone]  (720)[phone]   [place] (704)779-9254 or 650-789-6512 (After Hours)       Eat a bland diet, avoiding greasy, fatty, fried foods, as well as spicy and acidic foods or beverages.  Avoid eating within the hour or 2 before going to bed or laying down.  Also avoid teas, colas, coffee, chocolate, pepermint and spearment.  Take over the counter pepcid, one tablet by mouth twice a day, for the next 2 to 3 weeks.  May also take over the counter maalox/mylanta, as directed on packaging, as needed for discomfort. Gargle with warm water several times per day to help with discomfort.  May also use over the counter sore throat pain medicines such as chloraseptic or sucrets, as directed on packaging, as needed for discomfort. Take the prescriptions as directed.  Call your regular GI doctor today to schedule a follow up appointment in the next 2 days.  Return to the Emergency Department immediately if worsening.

## 2014-08-17 NOTE — ED Notes (Signed)
Pt escorted to discharge window. Verbalized understanding discharge instructions. In no acute distress.   

## 2014-08-17 NOTE — ED Notes (Signed)
Patient transported to X-ray 

## 2014-08-17 NOTE — ED Notes (Signed)
Patient presents to ED with c/o abdominal pain.  Pt reports that she started having epigastric pain since last Sunday after church; pt also reports emesis with "some bright red blood but not anymore, but I've been throwing up constantly".  Pt reports epigastric pain that goes up to chest and throat.  Pt denies dizziness or shortness of breath.

## 2014-08-17 NOTE — ED Notes (Signed)
Patient transported to CT 

## 2014-08-17 NOTE — ED Provider Notes (Signed)
CSN: [id number]     Arrival date & time 08/17/14  4332 History   First MD Initiated Contact with Patient 08/17/14 [phone]     Chief Complaint  Patient presents with  . Abdominal Pain    HPI Pt was seen at 0715.  Per pt, c/o gradual onset and persistence of constant acute flair of her chronic upper abd "pain" for the past 3 days.  Has been associated with multiple intermittent episodes of N/V/D.  Describes the abd pain as "aching" and burning "up from my stomach to my chest and my throat." States she "just keeps vomiting and vomiting." States several days ago she had one episode of emesis "with streaks of red blood in it;" denies having any further episodes. Denies fevers, no back pain, no rash, no CP/SOB, no black or blood in stools. The symptoms have been associated with no other complaints. The patient has a significant history of similar symptoms previously, recently being evaluated for this complaint and multiple prior evals for same. Pt states she called her GI Dr. Penelope Coop yesterday for same and was instructed to come to the ED for further evaluation.    GI: Dr. Penelope Coop Past Medical History  Diagnosis Date  . Hypertension   . Ulcerative colitis   . Seizure disorder   . HTN (hypertension), benign 01/03/2012  . Depression 01/03/2012  . 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 Hyperlipidemia 01/03/2012  . Fibroids 01/03/2012  . Chest pain, exertional   . SLE (systemic lupus erythematosus)   . GERD (gastroesophageal reflux disease)   . Hepatitis C     "signs of; not active" (05/28/2013)  . Migraines     "a few times/yr" (05/28/2013)  . Seizure     "today; < 1 yr ago, they just happen; started having them in my 20's" (05/28/2013)  . Chronic lower back pain   . Anxiety   . Chronic abdominal pain   . IBS (irritable bowel syndrome)   . Nausea and vomiting     chronic, recurrent  . Diverticulosis    Past Surgical History  Procedure Laterality Date  . Cholecystectomy  1978  . Abdominal hysterectomy  1978     "partial" (05/28/2013)  . Bilateral oophorectomy Bilateral 1987  . Colonoscopy N/A 05/31/2013    Procedure: COLONOSCOPY;  Surgeon: Wonda Horner, MD;  Location: [facility] ENDOSCOPY;  Service: Endoscopy;  Laterality: N/A;  . Esophagogastroduodenoscopy (egd) with propofol N/A 05/11/2014    Procedure: ESOPHAGOGASTRODUODENOSCOPY (EGD) WITH PROPOFOL;  Surgeon: Lear Ng, MD;  Location: [facility] ENDOSCOPY;  Service: Endoscopy;  Laterality: N/A;  egd first  . Flexible sigmoidoscopy N/A 05/11/2014    Procedure: FLEXIBLE SIGMOIDOSCOPY;  Surgeon: Lear Ng, MD;  Location: [facility] ENDOSCOPY;  Service: Endoscopy;  Laterality: N/A;   Family History  Problem Relation Age of Onset  . Colon cancer Brother    History  Substance Use Topics  . Smoking status: Never Smoker   . Smokeless tobacco: Never Used  . Alcohol Use: Yes     Comment: 05/28/2013 "glass of wine rarely; weddings, etc"    Review of Systems ROS: Statement: All systems negative except as marked or noted in the HPI; Constitutional: Negative for fever and chills. ; ; Eyes: Negative for eye pain, redness and discharge. ; ; ENMT: Negative for ear pain, hoarseness, nasal congestion, sinus pressure and +sore throat. ; ; Cardiovascular: Negative for palpitations, diaphoresis, dyspnea and peripheral edema. ; ; Respiratory: Negative for cough, wheezing and stridor. ; ; Gastrointestinal: +N/V/D, abd pain, "  streaks of blood" in emesis. Negative for blood in stool, jaundice and rectal bleeding. . ; ; Genitourinary: Negative for dysuria, flank pain and hematuria. ; ; Musculoskeletal: Negative for back pain and neck pain. Negative for swelling and trauma.; ; Skin: Negative for pruritus, rash, abrasions, blisters, bruising and skin lesion.; ; Neuro: Negative for headache, lightheadedness and neck stiffness. Negative for weakness, altered level of consciousness , altered mental status, extremity weakness, paresthesias, involuntary movement, seizure and syncope.       Allergies  Aspirin; Darvocet; Isometheptene-apap-dichloral; Penicillins; Tape; Wellbutrin; and Dilaudid  Home Medications   Prior to Admission medications   Medication Sig Start Date End Date Taking? Authorizing Provider  acetaminophen-codeine (TYLENOL #3) 300-30 MG per tablet Take 1 tablet by mouth every 8 (eight) hours as needed for moderate pain.  04/19/14   Historical Provider, MD  cyclobenzaprine (FLEXERIL) 10 MG tablet Take 10 mg by mouth 3 (three) times daily as needed for muscle spasms.    Historical Provider, MD  dicyclomine (BENTYL) 20 MG tablet Take 20 mg by mouth 4 (four) times daily -  before meals and at bedtime.    Historical Provider, MD  FLUoxetine (PROZAC) 20 MG capsule Take 20 mg by mouth every morning.    Historical Provider, MD  levETIRAcetam (KEPPRA XR) 500 MG 24 hr tablet Take 500 mg by mouth every morning.     Historical Provider, MD  Multiple Vitamin (MULTIVITAMIN WITH MINERALS) TABS tablet Take 1 tablet by mouth daily.    Historical Provider, MD  oxyCODONE-acetaminophen (PERCOCET/ROXICET) 5-325 MG per tablet Take 1 tablet by mouth every 6 (six) hours as needed for severe pain.    Historical Provider, MD  predniSONE (DELTASONE) 50 MG tablet Take 50 mg by mouth daily with breakfast.    Historical Provider, MD  Propylene Glycol (SYSTANE BALANCE OP) Apply 2 drops to eye 2 (two) times daily.    Historical Provider, MD  triamterene-hydrochlorothiazide (MAXZIDE-25) 37.5-25 MG per tablet Take 1 tablet by mouth daily. 06/16/14   Historical Provider, MD   BP 111/90  Temp(Src) 98 F (36.7 C) (Oral)  Resp 16  SpO2 96% Filed Vitals:   08/17/14 0830 08/17/14 0934 08/17/14 1000 08/17/14 1030  BP: 136/88 118/70 112/67 120/70  Pulse: 88  81   Temp:  97.6 F (36.4 C)    TempSrc:  Oral    Resp: 14 16 8 11   SpO2: 98% 96% 98%     Physical Exam 0720: Physical examination:  Nursing notes reviewed; Vital signs and O2 SAT reviewed;  Constitutional: Well developed, Well  nourished, Well hydrated, In no acute distress; Head:  Normocephalic, atraumatic; Eyes: EOMI, PERRL, No scleral icterus; ENMT: TM's clear bilat. +edemetous nasal turbinates bilat with clear rhinorrhea. Mouth and pharynx without lesions. No tonsillar exudates. No intra-oral edema. No submandibular or sublingual edema. No hoarse voice, no drooling, no stridor. No pain with manipulation of larynx. No trismus. Mouth and pharynx normal, Mucous membranes moist; Neck: Supple, Full range of motion, No lymphadenopathy; Cardiovascular: Regular rate and rhythm, No gallop; Respiratory: Breath sounds clear & equal bilaterally, No rales, rhonchi, wheezes.  Speaking full sentences with ease, Normal respiratory effort/excursion; Chest: Nontender, Movement normal; Abdomen: Soft, +mild mid-epigastric > diffuse tenderness to palp. No rebound or guarding. Nondistended, Normal bowel sounds. Rectal exam performed w/permission of pt and ED RN chaperone present.  Anal tone normal.  Non-tender, soft brown stool in rectal vault, heme neg.  No fissures, no external hemorrhoids, no palp masses.;; Genitourinary: No CVA tenderness;  Extremities: Pulses normal, No tenderness, No edema, No calf edema or asymmetry.; Neuro: AA&Ox3, Major CN grossly intact.  Speech clear. No gross focal motor or sensory deficits in extremities.; Skin: Color normal, Warm, Dry.   ED Course  Procedures     EKG Interpretation   Date/Time:  Wednesday August 17 2014 07:09:52 EDT Ventricular Rate:  91 PR Interval:  172 QRS Duration: 78 QT Interval:  331 QTC Calculation: 407 R Axis:   31 Text Interpretation:  Sinus rhythm Low voltage, extremity and precordial  leads Baseline wander When compared with ECG of 05/09/2014 No significant  change was found Confirmed by [facility]  MD, Nunzio Cory [phone]) on 08/17/2014  7:48:41 AM      MDM  MDM Reviewed: previous chart, nursing note and vitals Reviewed previous: labs, ECG and CT scan Interpretation: labs,  x-ray, ECG and CT scan    Results for orders placed during the hospital encounter of 08/17/14  URINALYSIS, ROUTINE W REFLEX MICROSCOPIC      Result Value Ref Range   Color, Urine AMBER (*) YELLOW   APPearance CLOUDY (*) CLEAR   Specific Gravity, Urine 1.026  1.005 - 1.030   pH 6.0  5.0 - 8.0   Glucose, UA NEGATIVE  NEGATIVE mg/dL   Hgb urine dipstick NEGATIVE  NEGATIVE   Bilirubin Urine SMALL (*) NEGATIVE   Ketones, ur NEGATIVE  NEGATIVE mg/dL   Protein, ur NEGATIVE  NEGATIVE mg/dL   Urobilinogen, UA 0.2  0.0 - 1.0 mg/dL   Nitrite NEGATIVE  NEGATIVE   Leukocytes, UA NEGATIVE  NEGATIVE  CBC WITH DIFFERENTIAL      Result Value Ref Range   WBC 4.7  4.0 - 10.5 K/uL   RBC 5.13 (*) 3.87 - 5.11 MIL/uL   Hemoglobin 13.3  12.0 - 15.0 g/dL   HCT 41.3  36.0 - 46.0 %   MCV 80.5  78.0 - 100.0 fL   MCH 25.9 (*) 26.0 - 34.0 pg   MCHC 32.2  30.0 - 36.0 g/dL   RDW 15.1  11.5 - 15.5 %   Platelets 336  150 - 400 K/uL   Neutrophils Relative % 42 (*) 43 - 77 %   Neutro Abs 2.0  1.7 - 7.7 K/uL   Lymphocytes Relative 47 (*) 12 - 46 %   Lymphs Abs 2.2  0.7 - 4.0 K/uL   Monocytes Relative 11  3 - 12 %   Monocytes Absolute 0.5  0.1 - 1.0 K/uL   Eosinophils Relative 0  0 - 5 %   Eosinophils Absolute 0.0  0.0 - 0.7 K/uL   Basophils Relative 0  0 - 1 %   Basophils Absolute 0.0  0.0 - 0.1 K/uL  COMPREHENSIVE METABOLIC PANEL      Result Value Ref Range   Sodium 138  137 - 147 mEq/L   Potassium 3.6 (*) 3.7 - 5.3 mEq/L   Chloride 97  96 - 112 mEq/L   CO2 27  19 - 32 mEq/L   Glucose, Bld 110 (*) 70 - 99 mg/dL   BUN 10  6 - 23 mg/dL   Creatinine, Ser 0.65  0.50 - 1.10 mg/dL   Calcium 9.7  8.4 - 10.5 mg/dL   Total Protein 8.5 (*) 6.0 - 8.3 g/dL   Albumin 3.8  3.5 - 5.2 g/dL   AST 17  0 - 37 U/L   ALT 18  0 - 35 U/L   Alkaline Phosphatase 62  39 - 117 U/L  Total Bilirubin 0.3  0.3 - 1.2 mg/dL   GFR calc non Af Amer >90  >90 mL/min   GFR calc Af Amer >90  >90 mL/min   Anion gap 14  5 - 15   LIPASE, BLOOD      Result Value Ref Range   Lipase 16  11 - 59 U/L  TROPONIN I      Result Value Ref Range   Troponin I <0.30  <0.30 ng/mL  POC OCCULT BLOOD, ED      Result Value Ref Range   Fecal Occult Bld NEGATIVE  NEGATIVE   Dg Neck Soft Tissue 08/17/2014   CLINICAL DATA:  Sore throat for several days  EXAM: NECK SOFT TISSUES - 1+ VIEW  COMPARISON:  None.  FINDINGS: Frontal and lateral views were obtained. There is no evidence of retropharyngeal soft tissue swelling. Epiglottis and aryepiglottic fold regions appear normal. Tongue base region appears normal. There is no air-fluid level to suggest abscess. The cervical airway is unremarkable and no radio-opaque foreign body identified. There is degenerative change in the cervical spine.  IMPRESSION: Degenerative change in the cervical spine. No soft tissue abnormality. Epiglottis and aryepiglottic fold regions appear normal.   Electronically Signed   By: Lowella Grip M.D.   On: 08/17/2014 07:58   Dg Chest 2 View 08/17/2014   CLINICAL DATA:  Right-sided chest pain with mild difficulty breathing  EXAM: CHEST  2 VIEW  COMPARISON:  May 09, 2014  FINDINGS: There is mild elevation of the right hemidiaphragm which is stable. There is no edema or consolidation. The heart size and pulmonary vascularity are normal. No adenopathy. No bone lesions.  IMPRESSION: No edema or consolidation.   Electronically Signed   By: Lowella Grip M.D.   On: 08/17/2014 07:57   Ct Abdomen Pelvis W Contrast 08/17/2014   CLINICAL DATA:  Diverticulosis. Colitis. Abdominal pain for 3 days.  EXAM: CT ABDOMEN AND PELVIS WITH CONTRAST  TECHNIQUE: Multidetector CT imaging of the abdomen and pelvis was performed using the standard protocol following bolus administration of intravenous contrast.  CONTRAST:  41mL OMNIPAQUE IOHEXOL 300 MG/ML SOLN, 16mL OMNIPAQUE IOHEXOL 300 MG/ML SOLN  COMPARISON:  CT 05/09/2014. CT 05/27/2013. MRCP 01/06/2012. CT 10/21/2011.  FINDINGS: No  focal hepatic abnormality. Fatty infiltration of the liver noted. Spleen normal. Splenosis. Cholecystectomy. Unchanged distention of the evidence biliary system, most likely from prior cholecystectomy. Stable distention of the pancreatic duct. No obstructing lesions identified. Stable fullness of the ampulla. This is unchanged from prior MRCP of 01/06/2012. Followup LFTs suggested to demonstrate stability.  There stable approximately 1 cm fat containing lesion left adrenal consistent with adrenal myelolipoma. Adrenals otherwise unremarkable. Kidneys are unremarkable. No hydronephrosis or obstructing ureteral stone. The bladder is nondistended. Hysterectomy. No adnexal mass. No free pelvic fluid.  No significant adenopathy. Abdominal aorta and its branch vessels are widely patent. Portal vein is patent.  Appendix is not visualized. No inflammatory change in the right lower quadrant. No inflammatory change left lower quadrant. No bowel distention. Diverticulosis. No evidence of diverticulitis. Stomach is nondistended. No free air. No mesenteric mass. No significant hernia.  Heart size normal. Bibasilar atelectasis. Diffuse degenerative change lumbar spine.  IMPRESSION: 1. Cholecystectomy. There is stable distention of the biliary system and pancreatic duct. No obstructing lesion is identified. There is stable fullness of the ampulla. No change noted from MRCP of 01/06/2012. Follow-up III suggest demonstrate stability. 2. Fatty infiltration of the liver. 3. Stable left adrenal myelolipoma.   Electronically  Signed   ByMarcello Moores  Register   On: 08/17/2014 09:34     1050:  Pt does endorse today's symptoms are c/w acute flair of her chronic abd pain and recurrent N/V. Endorses sore throat began after vomiting; doubt strep, and XR without free air to suggest Boerhaave's.  Pt has tol PO well while in the ED without N/V.  No stooling while in the ED.  Abd benign, VSS. Feels better and wants to go home now. Dx and testing  d/w pt and family.  Questions answered.  Verb understanding, agreeable to d/c home with outpt f/u.    Francine Graven, DO 08/20/14 1252

## 2014-08-17 NOTE — ED Notes (Signed)
Pt was able to tolerate Ginger Ale.

## 2014-08-18 LAB — URINE CULTURE

## 2014-08-23 ENCOUNTER — Encounter (HOSPITAL_COMMUNITY): Payer: Self-pay | Admitting: Emergency Medicine

## 2014-08-23 ENCOUNTER — Emergency Department (HOSPITAL_COMMUNITY): Payer: Medicare Other

## 2014-08-23 ENCOUNTER — Emergency Department (HOSPITAL_COMMUNITY)
Admission: EM | Admit: 2014-08-23 | Discharge: 2014-08-23 | Disposition: A | Discharge status: Home / Self Care | Payer: Medicare Other | Attending: Emergency Medicine | Admitting: Emergency Medicine

## 2014-08-23 DIAGNOSIS — I10 Essential (primary) hypertension: Secondary | ICD-10-CM | POA: Diagnosis not present

## 2014-08-23 DIAGNOSIS — G43909 Migraine, unspecified, not intractable, without status migrainosus: Secondary | ICD-10-CM | POA: Insufficient documentation

## 2014-08-23 DIAGNOSIS — G40909 Epilepsy, unspecified, not intractable, without status epilepticus: Secondary | ICD-10-CM | POA: Insufficient documentation

## 2014-08-23 DIAGNOSIS — Z8639 Personal history of other endocrine, nutritional and metabolic disease: Secondary | ICD-10-CM | POA: Diagnosis not present

## 2014-08-23 DIAGNOSIS — G8929 Other chronic pain: Secondary | ICD-10-CM | POA: Insufficient documentation

## 2014-08-23 DIAGNOSIS — R079 Chest pain, unspecified: Secondary | ICD-10-CM | POA: Diagnosis not present

## 2014-08-23 DIAGNOSIS — F329 Major depressive disorder, single episode, unspecified: Secondary | ICD-10-CM | POA: Diagnosis not present

## 2014-08-23 DIAGNOSIS — Z79899 Other long term (current) drug therapy: Secondary | ICD-10-CM | POA: Insufficient documentation

## 2014-08-23 DIAGNOSIS — K219 Gastro-esophageal reflux disease without esophagitis: Secondary | ICD-10-CM | POA: Insufficient documentation

## 2014-08-23 DIAGNOSIS — Z880 Allergy status to penicillin: Secondary | ICD-10-CM | POA: Diagnosis not present

## 2014-08-23 DIAGNOSIS — R569 Unspecified convulsions: Secondary | ICD-10-CM | POA: Diagnosis not present

## 2014-08-23 DIAGNOSIS — R531 Weakness: Secondary | ICD-10-CM | POA: Diagnosis not present

## 2014-08-23 LAB — LIPASE, BLOOD: Lipase: 20 U/L (ref 11–59)

## 2014-08-23 LAB — I-STAT TROPONIN, ED: Troponin i, poc: 0 ng/mL (ref 0.00–0.08)

## 2014-08-23 LAB — CBC
HEMATOCRIT: 40.7 % (ref 36.0–46.0)
Hemoglobin: 13.1 g/dL (ref 12.0–15.0)
MCH: 25.6 pg — ABNORMAL LOW (ref 26.0–34.0)
MCHC: 32.2 g/dL (ref 30.0–36.0)
MCV: 79.6 fL (ref 78.0–100.0)
Platelets: 321 10*3/uL (ref 150–400)
RBC: 5.11 MIL/uL (ref 3.87–5.11)
RDW: 15.1 % (ref 11.5–15.5)
WBC: 5.9 10*3/uL (ref 4.0–10.5)

## 2014-08-23 LAB — HEPATIC FUNCTION PANEL
ALBUMIN: 3.9 g/dL (ref 3.5–5.2)
ALT: 52 U/L — ABNORMAL HIGH (ref 0–35)
AST: 24 U/L (ref 0–37)
Alkaline Phosphatase: 81 U/L (ref 39–117)
Bilirubin, Direct: <0.2 mg/dL (ref 0.0–0.3)
TOTAL PROTEIN: 9 g/dL — AB (ref 6.0–8.3)

## 2014-08-23 LAB — BASIC METABOLIC PANEL
Anion gap: 15 (ref 5–15)
BUN: 9 mg/dL (ref 6–23)
CHLORIDE: 98 meq/L (ref 96–112)
CO2: 25 mEq/L (ref 19–32)
CREATININE: 0.52 mg/dL (ref 0.50–1.10)
Calcium: 10 mg/dL (ref 8.4–10.5)
GFR calc Af Amer: >90 mL/min (ref >90)
GFR calc non Af Amer: >90 mL/min (ref >90)
Glucose, Bld: 102 mg/dL — ABNORMAL HIGH (ref 70–99)
Potassium: 3.4 mEq/L — ABNORMAL LOW (ref 3.7–5.3)
Sodium: 138 mEq/L (ref 137–147)

## 2014-08-23 LAB — PRO B NATRIURETIC PEPTIDE: Pro B Natriuretic peptide (BNP): 8.1 pg/mL (ref 0–125)

## 2014-08-23 MED ORDER — LORAZEPAM 1 MG PO TABS
2.0000 mg | ORAL_TABLET | Freq: Once | ORAL | Status: AC
  Administered 2014-08-23: 2 mg via ORAL
  Filled 2014-08-23: qty 2

## 2014-08-23 NOTE — ED Provider Notes (Signed)
CSN: [id number]     Arrival date & time 08/23/14  1638 History   First MD Initiated Contact with Patient 08/23/14 1808     Chief Complaint  Patient presents with  . Chest Pain  . Seizures     (Consider location/radiation/quality/duration/timing/severity/associated sxs/prior Treatment) HPI Comments: Patient here complaining of possible seizure prior to arrival along with substernal chest squeezing. Chest squeezing has been present for over a month and has been intermittent. It has not been associated with dyspnea diaphoresis. No cough or congestion noted. No exertional component. Symptoms became worse today when she became more stressed. Denies syncope or near-syncope.  She does have a history of seizure disorder and states that she has been compliant with her Keppra. She denies any tongue injury or bladder incontinence. No postictal state noted. Called EMS and was transported here.  Patient is a 58 y.o. female presenting with chest pain and seizures. The history is provided by the patient.  Chest Pain Seizures   Past Medical History  Diagnosis Date  . Hypertension   . Ulcerative colitis   . Seizure disorder   . HTN (hypertension), benign 01/03/2012  . Depression 01/03/2012  . 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 Hyperlipidemia 01/03/2012  . Fibroids 01/03/2012  . Chest pain, exertional   . SLE (systemic lupus erythematosus)   . GERD (gastroesophageal reflux disease)   . Hepatitis C     "signs of; not active" (05/28/2013)  . Migraines     "a few times/yr" (05/28/2013)  . Seizure     "today; < 1 yr ago, they just happen; started having them in my 20's" (05/28/2013)  . Chronic lower back pain   . Anxiety   . Chronic abdominal pain   . IBS (irritable bowel syndrome)   . Nausea and vomiting     chronic, recurrent  . Diverticulosis    Past Surgical History  Procedure Laterality Date  . Cholecystectomy  1978  . Abdominal hysterectomy  1978    "partial" (05/28/2013)  . Bilateral  oophorectomy Bilateral 1987  . Colonoscopy N/A 05/31/2013    Procedure: COLONOSCOPY;  Surgeon: Wonda Horner, MD;  Location: [facility] ENDOSCOPY;  Service: Endoscopy;  Laterality: N/A;  . Esophagogastroduodenoscopy (egd) with propofol N/A 05/11/2014    Procedure: ESOPHAGOGASTRODUODENOSCOPY (EGD) WITH PROPOFOL;  Surgeon: Lear Ng, MD;  Location: [facility] ENDOSCOPY;  Service: Endoscopy;  Laterality: N/A;  egd first  . Flexible sigmoidoscopy N/A 05/11/2014    Procedure: FLEXIBLE SIGMOIDOSCOPY;  Surgeon: Lear Ng, MD;  Location: [facility] ENDOSCOPY;  Service: Endoscopy;  Laterality: N/A;   Family History  Problem Relation Age of Onset  . Colon cancer Brother    History  Substance Use Topics  . Smoking status: Never Smoker   . Smokeless tobacco: Never Used  . Alcohol Use: Yes     Comment: 05/28/2013 "glass of wine rarely; weddings, etc"   OB History    No data available     Review of Systems  Cardiovascular: Positive for chest pain.  Neurological: Positive for seizures.  All other systems reviewed and are negative.     Allergies  Aspirin; Darvocet; Isometheptene-apap-dichloral; Penicillins; Tape; Wellbutrin; and Dilaudid  Home Medications   Prior to Admission medications   Medication Sig Start Date End Date Taking? Authorizing Provider  cyclobenzaprine (FLEXERIL) 10 MG tablet Take 10 mg by mouth 3 (three) times daily as needed for muscle spasms.    Historical Provider, MD  dicyclomine (BENTYL) 20 MG tablet Take 20 mg by mouth 4 (  four) times daily -  before meals and at bedtime.    Historical Provider, MD  FLUoxetine (PROZAC) 20 MG capsule Take 20 mg by mouth every morning.    Historical Provider, MD  HYDROcodone-acetaminophen (NORCO/VICODIN) 5-325 MG per tablet 1 or 2 tabs PO q6 hours prn pain 08/17/14   Francine Graven, DO  levETIRAcetam (KEPPRA XR) 500 MG 24 hr tablet Take 500 mg by mouth every morning.     Historical Provider, MD  mesalamine (ROWASA) 4 G enema Place 4 g  rectally daily as needed (for colitis).    Historical Provider, MD  Multiple Vitamin (MULTIVITAMIN WITH MINERALS) TABS tablet Take 1 tablet by mouth daily.    Historical Provider, MD  promethazine (PHENERGAN) 25 MG tablet Take 1 tablet (25 mg total) by mouth every 6 (six) hours as needed for nausea or vomiting. 08/17/14   Francine Graven, DO  Propylene Glycol (SYSTANE BALANCE OP) Apply 2 drops to eye 2 (two) times daily.    Historical Provider, MD  triamterene-hydrochlorothiazide (MAXZIDE-25) 37.5-25 MG per tablet Take 1 tablet by mouth daily. 06/16/14   Historical Provider, MD   There were no vitals taken for this visit. Physical Exam  Constitutional: She is oriented to person, place, and time. She appears well-developed and well-nourished.  Non-toxic appearance. No distress.  HENT:  Head: Normocephalic and atraumatic.  Eyes: Conjunctivae, EOM and lids are normal. Pupils are equal, round, and reactive to light.  Neck: Normal range of motion. Neck supple. No tracheal deviation present. No thyroid mass present.  Cardiovascular: Normal rate, regular rhythm and normal heart sounds.  Exam reveals no gallop.   No murmur heard. Pulmonary/Chest: Effort normal and breath sounds normal. No stridor. No respiratory distress. She has no decreased breath sounds. She has no wheezes. She has no rhonchi. She has no rales.  Abdominal: Soft. Normal appearance and bowel sounds are normal. She exhibits no distension. There is no tenderness. There is no rebound and no CVA tenderness.  Musculoskeletal: Normal range of motion. She exhibits no edema or tenderness.  Neurological: She is alert and oriented to person, place, and time. She has normal strength. No cranial nerve deficit or sensory deficit. GCS eye subscore is 4. GCS verbal subscore is 5. GCS motor subscore is 6.  Skin: Skin is warm and dry. No abrasion and no rash noted.  Psychiatric: Her speech is normal and behavior is normal. Her mood appears anxious.   Nursing note and vitals reviewed.   ED Course  Procedures (including critical care time) Labs Review Labs Reviewed  CBC - Abnormal; Notable for the following:    MCH 25.6 (*)    All other components within normal limits  BASIC METABOLIC PANEL - Abnormal; Notable for the following:    Potassium 3.4 (*)    Glucose, Bld 102 (*)    All other components within normal limits  PRO B NATRIURETIC PEPTIDE  LIPASE, BLOOD  HEPATIC FUNCTION PANEL  I-STAT TROPOININ, ED    Imaging Review No results found.   EKG Interpretation   Date/Time:  Tuesday August 23 2014 16:50:46 EST Ventricular Rate:  102 PR Interval:  177 QRS Duration: 68 QT Interval:  373 QTC Calculation: 486 R Axis:   59 Text Interpretation:  Sinus tachycardia Low voltage, precordial leads  Borderline T abnormalities, diffuse leads Borderline prolonged QT interval  Baseline wander in lead(s) V4 No significant change since last tracing  Confirmed by Debby Freiberg [phone]) on 08/23/2014 4:55:24 PM      MDM  Final diagnoses:  Chest pain    Patient monitored here and no seizure activity noted. Was given Ativan 2 mg. Workup here is reassuring. Her troponin was negative. I do not think the disruptions ACS. and she is encouraged to follow-up with her Dr.     Leota Jacobsen, MD 08/23/14 2156

## 2014-08-23 NOTE — ED Notes (Signed)
Pt. returned from XR. 

## 2014-08-23 NOTE — ED Notes (Addendum)
Pt c/o chest pain that started today, pt states she was told she had 2 seizures at the salon but can only remember 1. Pt states she cannot think straight right now and things are "fuzzy." Pt also c/o SOB. EMs came out to pt earlier but refused transport.

## 2014-08-23 NOTE — Discharge Instructions (Signed)

## 2014-08-23 NOTE — ED Notes (Signed)
Patient transported to X-ray 

## 2014-09-24 DIAGNOSIS — J069 Acute upper respiratory infection, unspecified: Secondary | ICD-10-CM | POA: Diagnosis not present

## 2014-09-24 DIAGNOSIS — R05 Cough: Secondary | ICD-10-CM | POA: Diagnosis not present

## 2014-10-01 ENCOUNTER — Emergency Department (INDEPENDENT_AMBULATORY_CARE_PROVIDER_SITE_OTHER)
Admission: EM | Admit: 2014-10-01 | Discharge: 2014-10-01 | Disposition: A | Discharge status: Home / Self Care | Payer: Medicare Other | Source: Home / Self Care | Attending: Family Medicine | Admitting: Family Medicine

## 2014-10-01 ENCOUNTER — Encounter (HOSPITAL_COMMUNITY): Payer: Self-pay | Admitting: Emergency Medicine

## 2014-10-01 DIAGNOSIS — S39012A Strain of muscle, fascia and tendon of lower back, initial encounter: Secondary | ICD-10-CM

## 2014-10-01 MED ORDER — OXYCODONE-ACETAMINOPHEN 5-325 MG PO TABS
1.0000 | ORAL_TABLET | Freq: Four times a day (QID) | ORAL | Status: DC | PRN
Start: 2014-10-01 — End: 2014-11-18

## 2014-10-01 MED ORDER — CYCLOBENZAPRINE HCL 5 MG PO TABS: 5.0000 mg | ORAL_TABLET | Freq: Three times a day (TID) | ORAL | Status: DC | PRN

## 2014-10-01 NOTE — ED Provider Notes (Signed)
CSN: [id number]     Arrival date & time 10/01/14  1021 History   First MD Initiated Contact with Patient 10/01/14 1041     Chief Complaint  Patient presents with  . Spasms    lower back   (Consider location/radiation/quality/duration/timing/severity/associated sxs/prior Treatment) Patient is a 58 y.o. female presenting with back pain. The history is provided by the patient.  Back Pain Location:  Lumbar spine Quality:  Stiffness Radiates to:  Does not radiate Pain severity:  Moderate Onset quality:  Gradual Duration:  2 days Progression:  Unchanged Chronicity:  Recurrent Context: twisting   Relieved by:  None tried Worsened by:  Nothing tried Associated symptoms: no abdominal pain, no abdominal swelling, no bladder incontinence, no bowel incontinence, no chest pain, no dysuria, no fever, no leg pain, no numbness and no paresthesias   Risk factors comment:  H/o similar muscle spasms in back.   Past Medical History  Diagnosis Date  . Hypertension   . Ulcerative colitis   . Seizure disorder   . HTN (hypertension), benign 01/03/2012  . Depression 01/03/2012  . 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 Hyperlipidemia 01/03/2012  . Fibroids 01/03/2012  . Chest pain, exertional   . SLE (systemic lupus erythematosus)   . GERD (gastroesophageal reflux disease)   . Hepatitis C     "signs of; not active" (05/28/2013)  . Migraines     "a few times/yr" (05/28/2013)  . Seizure     "today; < 1 yr ago, they just happen; started having them in my 20's" (05/28/2013)  . Chronic lower back pain   . Anxiety   . Chronic abdominal pain   . IBS (irritable bowel syndrome)   . Nausea and vomiting     chronic, recurrent  . Diverticulosis    Past Surgical History  Procedure Laterality Date  . Cholecystectomy  1978  . Abdominal hysterectomy  1978    "partial" (05/28/2013)  . Bilateral oophorectomy Bilateral 1987  . Colonoscopy N/A 05/31/2013    Procedure: COLONOSCOPY;  Surgeon: Wonda Horner, MD;  Location:  [facility] ENDOSCOPY;  Service: Endoscopy;  Laterality: N/A;  . Esophagogastroduodenoscopy (egd) with propofol N/A 05/11/2014    Procedure: ESOPHAGOGASTRODUODENOSCOPY (EGD) WITH PROPOFOL;  Surgeon: Lear Ng, MD;  Location: [facility] ENDOSCOPY;  Service: Endoscopy;  Laterality: N/A;  egd first  . Flexible sigmoidoscopy N/A 05/11/2014    Procedure: FLEXIBLE SIGMOIDOSCOPY;  Surgeon: Lear Ng, MD;  Location: [facility] ENDOSCOPY;  Service: Endoscopy;  Laterality: N/A;   Family History  Problem Relation Age of Onset  . Colon cancer Brother    History  Substance Use Topics  . Smoking status: Never Smoker   . Smokeless tobacco: Never Used  . Alcohol Use: Yes     Comment: 05/28/2013 "glass of wine rarely; weddings, etc"   OB History    No data available     Review of Systems  Constitutional: Negative.  Negative for fever.  Cardiovascular: Negative for chest pain.  Gastrointestinal: Negative.  Negative for abdominal pain and bowel incontinence.  Genitourinary: Negative for bladder incontinence and dysuria.  Musculoskeletal: Positive for back pain. Negative for joint swelling and gait problem.  Neurological: Negative for numbness and paresthesias.    Allergies  Aspirin; Darvocet; Isometheptene-apap-dichloral; Penicillins; Tape; Wellbutrin; and Dilaudid  Home Medications   Prior to Admission medications   Medication Sig Start Date End Date Taking? Authorizing Provider  cyclobenzaprine (FLEXERIL) 5 MG tablet Take 1 tablet (5 mg total) by mouth 3 (three) times daily as needed  for muscle spasms. 10/01/14   Billy Fischer, MD  dicyclomine (BENTYL) 20 MG tablet Take 20 mg by mouth 4 (four) times daily -  before meals and at bedtime.    Historical Provider, MD  FLUoxetine (PROZAC) 20 MG capsule Take 20 mg by mouth every morning.    Historical Provider, MD  HYDROcodone-acetaminophen (NORCO/VICODIN) 5-325 MG per tablet 1 or 2 tabs PO q6 hours prn pain 08/17/14   Francine Graven, DO  levETIRAcetam  (KEPPRA XR) 500 MG 24 hr tablet Take 500 mg by mouth every morning.     Historical Provider, MD  mesalamine (ROWASA) 4 G enema Place 4 g rectally daily as needed (for colitis).    Historical Provider, MD  Multiple Vitamin (MULTIVITAMIN WITH MINERALS) TABS tablet Take 1 tablet by mouth daily.    Historical Provider, MD  oxyCODONE-acetaminophen (PERCOCET/ROXICET) 5-325 MG per tablet Take 1 tablet by mouth every 6 (six) hours as needed for moderate pain or severe pain. 10/01/14   Billy Fischer, MD  promethazine (PHENERGAN) 25 MG tablet Take 1 tablet (25 mg total) by mouth every 6 (six) hours as needed for nausea or vomiting. 08/17/14   Francine Graven, DO  Propylene Glycol (SYSTANE BALANCE OP) Place 2 drops into both eyes 2 (two) times daily.     Historical Provider, MD  triamterene-hydrochlorothiazide (MAXZIDE-25) 37.5-25 MG per tablet Take 1 tablet by mouth daily. 06/16/14   Historical Provider, MD   BP 121/91 mmHg  Pulse 92  Temp(Src) 98.7 F (37.1 C) (Oral)  Resp 16  SpO2 98% Physical Exam  Constitutional: She is oriented to person, place, and time. She appears well-developed and well-nourished.  Abdominal: Soft. Bowel sounds are normal. She exhibits no distension. There is no tenderness. There is no rebound and no guarding.  Musculoskeletal: She exhibits tenderness.       Lumbar back: She exhibits tenderness, pain and spasm. She exhibits no bony tenderness and normal pulse.       Back:  Neurological: She is alert and oriented to person, place, and time. She displays normal reflexes. She exhibits normal muscle tone. Coordination normal.  Skin: Skin is warm and dry.  Nursing note and vitals reviewed.   ED Course  Procedures (including critical care time) Labs Review Labs Reviewed - No data to display  Imaging Review No results found.   MDM   1. Low back strain, initial encounter        Billy Fischer, MD 10/01/14 1059

## 2014-10-01 NOTE — Discharge Instructions (Signed)
Use heat and medicine, you should see your doctor if further problems.

## 2014-10-01 NOTE — ED Notes (Signed)
Pt states that she was putting up decorations last night and started to have muscle spasms. Pt is in no acute distress at this time VSS.

## 2014-10-27 DIAGNOSIS — Z8719 Personal history of other diseases of the digestive system: Secondary | ICD-10-CM | POA: Diagnosis not present

## 2014-11-05 ENCOUNTER — Emergency Department (HOSPITAL_COMMUNITY): Payer: Medicare Other

## 2014-11-05 ENCOUNTER — Emergency Department (HOSPITAL_COMMUNITY)
Admission: EM | Admit: 2014-11-05 | Discharge: 2014-11-05 | Disposition: A | Discharge status: Home / Self Care | Payer: Medicare Other | Attending: Emergency Medicine | Admitting: Emergency Medicine

## 2014-11-05 ENCOUNTER — Encounter (HOSPITAL_COMMUNITY): Payer: Self-pay | Admitting: Emergency Medicine

## 2014-11-05 DIAGNOSIS — K589 Irritable bowel syndrome without diarrhea: Secondary | ICD-10-CM | POA: Diagnosis not present

## 2014-11-05 DIAGNOSIS — F419 Anxiety disorder, unspecified: Secondary | ICD-10-CM | POA: Diagnosis not present

## 2014-11-05 DIAGNOSIS — Z8639 Personal history of other endocrine, nutritional and metabolic disease: Secondary | ICD-10-CM | POA: Diagnosis not present

## 2014-11-05 DIAGNOSIS — Z9049 Acquired absence of other specified parts of digestive tract: Secondary | ICD-10-CM | POA: Insufficient documentation

## 2014-11-05 DIAGNOSIS — Z8619 Personal history of other infectious and parasitic diseases: Secondary | ICD-10-CM | POA: Insufficient documentation

## 2014-11-05 DIAGNOSIS — F329 Major depressive disorder, single episode, unspecified: Secondary | ICD-10-CM | POA: Diagnosis not present

## 2014-11-05 DIAGNOSIS — Z9889 Other specified postprocedural states: Secondary | ICD-10-CM | POA: Diagnosis not present

## 2014-11-05 DIAGNOSIS — Z880 Allergy status to penicillin: Secondary | ICD-10-CM | POA: Insufficient documentation

## 2014-11-05 DIAGNOSIS — R112 Nausea with vomiting, unspecified: Secondary | ICD-10-CM | POA: Insufficient documentation

## 2014-11-05 DIAGNOSIS — M545 Low back pain, unspecified: Secondary | ICD-10-CM

## 2014-11-05 DIAGNOSIS — R1032 Left lower quadrant pain: Secondary | ICD-10-CM | POA: Insufficient documentation

## 2014-11-05 DIAGNOSIS — G8929 Other chronic pain: Secondary | ICD-10-CM | POA: Diagnosis not present

## 2014-11-05 DIAGNOSIS — Z792 Long term (current) use of antibiotics: Secondary | ICD-10-CM | POA: Insufficient documentation

## 2014-11-05 DIAGNOSIS — Z79899 Other long term (current) drug therapy: Secondary | ICD-10-CM | POA: Diagnosis not present

## 2014-11-05 DIAGNOSIS — R1030 Lower abdominal pain, unspecified: Secondary | ICD-10-CM

## 2014-11-05 DIAGNOSIS — G43909 Migraine, unspecified, not intractable, without status migrainosus: Secondary | ICD-10-CM | POA: Diagnosis not present

## 2014-11-05 DIAGNOSIS — Z90710 Acquired absence of both cervix and uterus: Secondary | ICD-10-CM | POA: Diagnosis not present

## 2014-11-05 DIAGNOSIS — I10 Essential (primary) hypertension: Secondary | ICD-10-CM | POA: Insufficient documentation

## 2014-11-05 DIAGNOSIS — M62830 Muscle spasm of back: Secondary | ICD-10-CM | POA: Insufficient documentation

## 2014-11-05 DIAGNOSIS — G40909 Epilepsy, unspecified, not intractable, without status epilepticus: Secondary | ICD-10-CM | POA: Diagnosis not present

## 2014-11-05 LAB — LIPASE, BLOOD: Lipase: 23 U/L (ref 11–59)

## 2014-11-05 LAB — URINALYSIS, ROUTINE W REFLEX MICROSCOPIC
GLUCOSE, UA: NEGATIVE mg/dL
Hgb urine dipstick: NEGATIVE
Ketones, ur: NEGATIVE mg/dL
Nitrite: NEGATIVE
PROTEIN: NEGATIVE mg/dL
Specific Gravity, Urine: 1.028 (ref 1.005–1.030)
Urobilinogen, UA: 0.2 mg/dL (ref 0.0–1.0)
pH: 6.5 (ref 5.0–8.0)

## 2014-11-05 LAB — COMPREHENSIVE METABOLIC PANEL
ALT: 47 U/L — AB (ref 0–35)
ANION GAP: 5 (ref 5–15)
AST: 26 U/L (ref 0–37)
Albumin: 4.1 g/dL (ref 3.5–5.2)
Alkaline Phosphatase: 68 U/L (ref 39–117)
BUN: 9 mg/dL (ref 6–23)
CALCIUM: 9.1 mg/dL (ref 8.4–10.5)
CHLORIDE: 104 meq/L (ref 96–112)
CO2: 26 mmol/L (ref 19–32)
Creatinine, Ser: 0.53 mg/dL (ref 0.50–1.10)
GFR calc Af Amer: >90 mL/min (ref >90)
GFR calc non Af Amer: >90 mL/min (ref >90)
GLUCOSE: 105 mg/dL — AB (ref 70–99)
Potassium: 3.4 mmol/L — ABNORMAL LOW (ref 3.5–5.1)
Sodium: 135 mmol/L (ref 135–145)
Total Bilirubin: 0.5 mg/dL (ref 0.3–1.2)
Total Protein: 8.2 g/dL (ref 6.0–8.3)

## 2014-11-05 LAB — CBC WITH DIFFERENTIAL/PLATELET
Basophils Absolute: 0 K/uL (ref 0.0–0.1)
Basophils Relative: 0 % (ref 0–1)
Eosinophils Absolute: 0 K/uL (ref 0.0–0.7)
Eosinophils Relative: 0 % (ref 0–5)
HCT: 40.7 % (ref 36.0–46.0)
Hemoglobin: 13.1 g/dL (ref 12.0–15.0)
Lymphocytes Relative: 55 % — ABNORMAL HIGH (ref 12–46)
Lymphs Abs: 2.1 K/uL (ref 0.7–4.0)
MCH: 26 pg (ref 26.0–34.0)
MCHC: 32.2 g/dL (ref 30.0–36.0)
MCV: 80.8 fL (ref 78.0–100.0)
Monocytes Absolute: 0.4 K/uL (ref 0.1–1.0)
Monocytes Relative: 11 % (ref 3–12)
Neutro Abs: 1.4 K/uL — ABNORMAL LOW (ref 1.7–7.7)
Neutrophils Relative %: 34 % — ABNORMAL LOW (ref 43–77)
Platelets: 355 K/uL (ref 150–400)
RBC: 5.04 MIL/uL (ref 3.87–5.11)
RDW: 15.7 % — ABNORMAL HIGH (ref 11.5–15.5)
WBC: 4 K/uL (ref 4.0–10.5)

## 2014-11-05 LAB — URINE MICROSCOPIC-ADD ON

## 2014-11-05 MED ORDER — NAPROXEN 500 MG PO TABS: 500.0000 mg | ORAL_TABLET | Freq: Two times a day (BID) | ORAL | Status: DC | PRN

## 2014-11-05 MED ORDER — PROMETHAZINE HCL 25 MG PO TABS: 25.0000 mg | ORAL_TABLET | Freq: Four times a day (QID) | ORAL | Status: DC | PRN

## 2014-11-05 MED ORDER — FENTANYL CITRATE 0.05 MG/ML IJ SOLN
50.0000 ug | Freq: Once | INTRAMUSCULAR | Status: AC
  Administered 2014-11-05: 50 ug via INTRAVENOUS
  Filled 2014-11-05: qty 2

## 2014-11-05 MED ORDER — PROMETHAZINE HCL 25 MG/ML IJ SOLN
25.0000 mg | Freq: Once | INTRAMUSCULAR | Status: AC
  Administered 2014-11-05: 25 mg via INTRAVENOUS
  Filled 2014-11-05: qty 1

## 2014-11-05 MED ORDER — SODIUM CHLORIDE 0.9 % IV BOLUS (SEPSIS)
1000.0000 mL | Freq: Once | INTRAVENOUS | Status: AC
  Administered 2014-11-05: 1000 mL via INTRAVENOUS

## 2014-11-05 MED ORDER — OXYCODONE HCL 5 MG PO TABS: 5.0000 mg | ORAL_TABLET | ORAL | Status: DC | PRN

## 2014-11-05 MED ORDER — IOHEXOL 300 MG/ML  SOLN
50.0000 mL | Freq: Once | INTRAMUSCULAR | Status: AC | PRN
  Administered 2014-11-05: 50 mL via ORAL

## 2014-11-05 MED ORDER — MORPHINE SULFATE 4 MG/ML IJ SOLN
4.0000 mg | Freq: Once | INTRAMUSCULAR | Status: AC
  Administered 2014-11-05: 4 mg via INTRAVENOUS
  Filled 2014-11-05: qty 1

## 2014-11-05 MED ORDER — IOHEXOL 300 MG/ML  SOLN
100.0000 mL | Freq: Once | INTRAMUSCULAR | Status: AC | PRN
  Administered 2014-11-05: 100 mL via INTRAVENOUS

## 2014-11-05 NOTE — ED Notes (Signed)
Pt completed the PO challenge by drinking 122ml of gingerale, and ate 1 pk of crackers. denies any N/V after.

## 2014-11-05 NOTE — ED Provider Notes (Signed)
CSN: [id number]     Arrival date & time 11/05/14  4431 History   First MD Initiated Contact with Patient 11/05/14 [phone]     Chief Complaint  Patient presents with  . Abdominal Pain  . Back Pain     (Consider location/radiation/quality/duration/timing/severity/associated sxs/prior Treatment) HPI Comments: Erin Wolf is a 59 y.o. female with a PMHx of ulcerative colitis, HTN, GERD, hep C, chronic low back pain, IBS, chronic recurrent N/V, diverticulosis, depression, HLD, fibroids, exertional CP, SLE, and chronic migraines, with a PSHx of cholecystectomy, abd hysterectomy, and bilateral oophorectomy, who presents to the ED with complaints of "colitis flare". Patient reports that yesterday she began having lower abdominal cramping that she states feels very similar to her prior colitis flares. She reports the pain is 10/10 located in the lower abdomen, crampy, constant, nonradiating, worse with bowel movements, bending, and eating, and improved with lying down. She also developed nonbloody nonbilious emesis 2 consisting of stomach contents only. Additionally she reports chronic lumbar back pain which is unchanged, stating that it feels like spasms which she typically has, and she has been taking Flexeril with no relief. She has not tried any other medications for her symptoms. She typically gets Rowasa, prednisone, Phenergan, and oxycodone for her colitis flares. She denies any fevers, chills, chest pain, shortness of breath, diarrhea, mucus or bloody stools, melena, hematochezia, rectal pain, dysuria, hematuria, vaginal bleeding or discharge, history of cancer or IV drug use, numbness, tingling, weakness, cauda equina symptoms, or rashes.  Patient is a 59 y.o. female presenting with abdominal pain and back pain. The history is provided by the patient. No language interpreter was used.  Abdominal Pain Pain location:  LLQ and suprapubic Pain quality: cramping   Pain radiates to:  Does not  radiate Pain severity:  Severe (10/10) Onset quality:  Gradual Duration:  1 day Timing:  Constant Progression:  Unchanged Chronicity:  Recurrent Context: not recent travel and not sick contacts   Relieved by:  Lying down Worsened by:  Eating, bowel movements, movement and palpation Ineffective treatments:  None tried Associated symptoms: nausea and vomiting   Associated symptoms: no anorexia, no chest pain, no chills, no constipation, no diarrhea, no dysuria, no fever, no flatus, no hematemesis, no hematochezia, no hematuria, no melena, no shortness of breath, no vaginal bleeding and no vaginal discharge   Back Pain Location:  Lumbar spine Quality:  Cramping Radiates to:  Does not radiate Pain severity:  Moderate Pain is:  Same all the time Onset quality:  Unable to specify Timing:  Constant Progression:  Unchanged Chronicity:  Chronic Relieved by:  Lying down Worsened by:  Movement Ineffective treatments:  Muscle relaxants Associated symptoms: abdominal pain   Associated symptoms: no bladder incontinence, no bowel incontinence, no chest pain, no dysuria, no fever, no headaches, no leg pain, no numbness, no paresthesias, no perianal numbness, no tingling, no weakness and no weight loss   Risk factors: no hx of cancer     Past Medical History  Diagnosis Date  . Hypertension   . Ulcerative colitis   . Seizure disorder   . HTN (hypertension), benign 01/03/2012  . Depression 01/03/2012  . 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 Hyperlipidemia 01/03/2012  . Fibroids 01/03/2012  . Chest pain, exertional   . SLE (systemic lupus erythematosus)   . GERD (gastroesophageal reflux disease)   . Hepatitis C     "signs of; not active" (05/28/2013)  . Migraines     "a few times/yr" (05/28/2013)  .  Seizure     "today; < 1 yr ago, they just happen; started having them in my 20's" (05/28/2013)  . Chronic lower back pain   . Anxiety   . Chronic abdominal pain   . IBS (irritable bowel syndrome)   .  Nausea and vomiting     chronic, recurrent  . Diverticulosis    Past Surgical History  Procedure Laterality Date  . Cholecystectomy  1978  . Abdominal hysterectomy  1978    "partial" (05/28/2013)  . Bilateral oophorectomy Bilateral 1987  . Colonoscopy N/A 05/31/2013    Procedure: COLONOSCOPY;  Surgeon: Wonda Horner, MD;  Location: [facility] ENDOSCOPY;  Service: Endoscopy;  Laterality: N/A;  . Esophagogastroduodenoscopy (egd) with propofol N/A 05/11/2014    Procedure: ESOPHAGOGASTRODUODENOSCOPY (EGD) WITH PROPOFOL;  Surgeon: Lear Ng, MD;  Location: [facility] ENDOSCOPY;  Service: Endoscopy;  Laterality: N/A;  egd first  . Flexible sigmoidoscopy N/A 05/11/2014    Procedure: FLEXIBLE SIGMOIDOSCOPY;  Surgeon: Lear Ng, MD;  Location: [facility] ENDOSCOPY;  Service: Endoscopy;  Laterality: N/A;   Family History  Problem Relation Age of Onset  . Colon cancer Brother    History  Substance Use Topics  . Smoking status: Never Smoker   . Smokeless tobacco: Never Used  . Alcohol Use: Yes     Comment: 05/28/2013 "glass of wine rarely; weddings, etc"   OB History    No data available     Review of Systems  Constitutional: Negative for fever, chills and weight loss.  Respiratory: Negative for shortness of breath.   Cardiovascular: Negative for chest pain.  Gastrointestinal: Positive for nausea, vomiting and abdominal pain. Negative for diarrhea, constipation, blood in stool, melena, hematochezia, abdominal distention, anal bleeding, rectal pain, anorexia, flatus, hematemesis and bowel incontinence.  Endocrine: Negative for polydipsia and polyuria.  Genitourinary: Negative for bladder incontinence, dysuria, urgency, frequency, hematuria, flank pain, vaginal bleeding, vaginal discharge and difficulty urinating.  Musculoskeletal: Positive for back pain. Negative for myalgias, gait problem and neck pain.  Skin: Negative for color change.  Allergic/Immunologic: Negative for immunocompromised state.   Neurological: Negative for tingling, weakness, numbness, headaches and paresthesias.   10 Systems reviewed and are negative for acute change except as noted in the HPI.    Allergies  Aspirin; Darvocet; Isometheptene-apap-dichloral; Penicillins; Tape; Wellbutrin; and Dilaudid  Home Medications   Prior to Admission medications   Medication Sig Start Date End Date Taking? Authorizing Provider  cyclobenzaprine (FLEXERIL) 5 MG tablet Take 1 tablet (5 mg total) by mouth 3 (three) times daily as needed for muscle spasms. 10/01/14   Billy Fischer, MD  dicyclomine (BENTYL) 20 MG tablet Take 20 mg by mouth 4 (four) times daily -  before meals and at bedtime.    Historical Provider, MD  FLUoxetine (PROZAC) 20 MG capsule Take 20 mg by mouth every morning.    Historical Provider, MD  HYDROcodone-acetaminophen (NORCO/VICODIN) 5-325 MG per tablet 1 or 2 tabs PO q6 hours prn pain 08/17/14   Francine Graven, DO  levETIRAcetam (KEPPRA XR) 500 MG 24 hr tablet Take 500 mg by mouth every morning.     Historical Provider, MD  mesalamine (ROWASA) 4 G enema Place 4 g rectally daily as needed (for colitis).    Historical Provider, MD  Multiple Vitamin (MULTIVITAMIN WITH MINERALS) TABS tablet Take 1 tablet by mouth daily.    Historical Provider, MD  oxyCODONE-acetaminophen (PERCOCET/ROXICET) 5-325 MG per tablet Take 1 tablet by mouth every 6 (six) hours as needed for moderate pain  or severe pain. 10/01/14   Billy Fischer, MD  promethazine (PHENERGAN) 25 MG tablet Take 1 tablet (25 mg total) by mouth every 6 (six) hours as needed for nausea or vomiting. 08/17/14   Francine Graven, DO  Propylene Glycol (SYSTANE BALANCE OP) Place 2 drops into both eyes 2 (two) times daily.     Historical Provider, MD  triamterene-hydrochlorothiazide (MAXZIDE-25) 37.5-25 MG per tablet Take 1 tablet by mouth daily. 06/16/14   Historical Provider, MD   BP 134/89 mmHg  Pulse 92  Temp(Src) 98.9 F (37.2 C) (Oral)  Resp 20  SpO2  100% Physical Exam  Constitutional: She is oriented to person, place, and time. Vital signs are normal. She appears well-developed and well-nourished.  Non-toxic appearance. No distress.  Afebrile nontoxic NAD, VSS  HENT:  Head: Normocephalic and atraumatic.  Mouth/Throat: Oropharynx is clear and moist. Mucous membranes are dry.  Mildly dry mucous membranes  Eyes: Conjunctivae and EOM are normal. Right eye exhibits no discharge. Left eye exhibits no discharge.  Neck: Normal range of motion. Neck supple.  Cardiovascular: Normal rate, regular rhythm, normal heart sounds and intact distal pulses.  Exam reveals no gallop and no friction rub.   No murmur heard. Pulmonary/Chest: Effort normal and breath sounds normal. No respiratory distress. She has no decreased breath sounds. She has no wheezes. She has no rhonchi. She has no rales.  Abdominal: Soft. Normal appearance and bowel sounds are normal. She exhibits no distension. There is tenderness in the suprapubic area and left lower quadrant. There is no rigidity, no rebound, no guarding, no CVA tenderness, no tenderness at McBurney's point and negative Murphy's sign.    Soft, ND, +BS throughout, with mild suprapubic/LLQ TTP, no r/g/r, neg murphy's, neg mcburney's, no CVA TTP  Musculoskeletal: Normal range of motion.       Lumbar back: She exhibits tenderness and spasm. She exhibits normal range of motion and no bony tenderness.       Back:  Lumbar spine with FROM intact without spinous process TTP, no bony stepoffs or deformities, mild paraspinous muscle TTP and muscle spasms. Neg SLR bilaterally. Strength 5/5 in all extremities, sensation grossly intact in all extremities, gait steady  Neurological: She is alert and oriented to person, place, and time. She has normal strength. No sensory deficit. Gait normal.  Skin: Skin is warm, dry and intact. No rash noted.  Psychiatric: She has a normal mood and affect.  Nursing note and vitals  reviewed.   ED Course  Procedures (including critical care time) Labs Review Labs Reviewed  CBC WITH DIFFERENTIAL - Abnormal; Notable for the following:    RDW 15.7 (*)    Neutrophils Relative % 34 (*)    Neutro Abs 1.4 (*)    Lymphocytes Relative 55 (*)    All other components within normal limits  COMPREHENSIVE METABOLIC PANEL - Abnormal; Notable for the following:    Potassium 3.4 (*)    Glucose, Bld 105 (*)    ALT 47 (*)    All other components within normal limits  URINALYSIS, ROUTINE W REFLEX MICROSCOPIC - Abnormal; Notable for the following:    Color, Urine AMBER (*)    APPearance TURBID (*)    Bilirubin Urine SMALL (*)    Leukocytes, UA SMALL (*)    All other components within normal limits  URINE MICROSCOPIC-ADD ON - Abnormal; Notable for the following:    Squamous Epithelial / LPF MANY (*)    Bacteria, UA MANY (*)  All other components within normal limits  URINE CULTURE  LIPASE, BLOOD    Imaging Review Ct Abdomen Pelvis W Contrast  11/05/2014   CLINICAL DATA:  59 year old with left lower abdominal pain and low back pain since yesterday. History of colitis. History of hysterectomy, cholecystectomy and bilateral oophorectomy.  EXAM: CT ABDOMEN AND PELVIS WITH CONTRAST  TECHNIQUE: Multidetector CT imaging of the abdomen and pelvis was performed using the standard protocol following bolus administration of intravenous contrast.  CONTRAST:  45mL OMNIPAQUE IOHEXOL 300 MG/ML SOLN, 148mL OMNIPAQUE IOHEXOL 300 MG/ML SOLN  COMPARISON:  CT 08/17/2014  FINDINGS: Lung bases are clear.  Negative for free air.  Mild fullness of the intrahepatic biliary system and again noted is a prominent ampulla. These findings are unchanged from the exam on 10/21/2011. Post cholecystectomy. Mild fullness of the pancreatic duct is also stable. Slightly decreased attenuation of the liver parenchyma without a focal abnormality. The portal venous system is patent. Normal appearance of the spleen and  right adrenal gland. 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 Normal appearance of both kidneys.  No free fluid or lymphadenopathy. Uterus has been removed. Small amount of fluid in the urinary bladder. There is diverticulosis in the colon but no evidence for acute colonic inflammation. Moderate amount of stool in the colon. Normal appearance of the small bowel. Again noted is a small periumbilical hernia containing fat.  No acute bone abnormality. Disc space loss with vacuum disc phenomena at L3-L4, L4-L5 and L5-S1.  IMPRESSION: No acute abnormality in the abdomen or pelvis.  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   Electronically Signed   By: Markus Daft M.D.   On: 11/05/2014 11:11     EKG Interpretation None      MDM   Final diagnoses:  Lower abdominal pain  Non-intractable vomiting with nausea, vomiting of unspecified type  Chronic lumbar pain  Back spasm    59 y.o. female  With hx of diverticulosis and UC, having lower abd pain. Exam nonperitoneal but with suprapubic/LLQ TTP. Will obtain labs, urine, and CT to eval for diverticulitis vs colitis vs other etiology. Chronic back pain unchanged, mild spasms noted. Will give pain meds and antiemetics. Pt requests fentanyl and phenergan. Will reassess shortly.  12:47 PM Pt had increased pain so morphine was given, with resolution of pain. Tolerating PO well. U/A appeared contaminated therefore will send for culture but doubt UTI given no urinary symptoms. CBC unremarkable, CMP relatively unremarkable. Lipase WNL. CT abd/pelvis unremarkable aside from chronic multidisk disease in lumbar spine c/w history of chronic back pain. At this time doubt colitis or diverticulitis. Will give phenergan and oxycodone (states she can't take it with tylenol) and d/c home with instructions to f/up as outpt on Tuesday with her GI specialist. I explained the diagnosis and  have given explicit precautions to return to the ER including for any other new or worsening symptoms. The patient understands and accepts the medical plan as it's been dictated and I have answered their questions. Discharge instructions concerning home care and prescriptions have been given. The patient is STABLE and is discharged to home in good condition.  BP 124/58 mmHg  Pulse 75  Temp(Src) 98.5 F (36.9 C) (Oral)  Resp 18  SpO2 95%  Meds ordered this encounter  Medications  . promethazine (PHENERGAN) injection 25 mg    Sig:   . fentaNYL (SUBLIMAZE) injection 50 mcg  Sig:   . sodium chloride 0.9 % bolus 1,000 mL    Sig:   . iohexol (OMNIPAQUE) 300 MG/ML solution 50 mL    Sig:   . iohexol (OMNIPAQUE) 300 MG/ML solution 100 mL    Sig:   . morphine 4 MG/ML injection 4 mg    Sig:   . naproxen (NAPROSYN) 500 MG tablet    Sig: Take 1 tablet (500 mg total) by mouth 2 (two) times daily as needed for mild pain, moderate pain or headache (TAKE WITH MEALS.).    Dispense:  20 tablet    Refill:  0    Order Specific Question:  Supervising Provider    Answer:  Noemi Chapel D [[id number]]  . promethazine (PHENERGAN) 25 MG tablet    Sig: Take 1 tablet (25 mg total) by mouth every 6 (six) hours as needed for nausea or vomiting.    Dispense:  10 tablet    Refill:  0    Order Specific Question:  Supervising Provider    Answer:  Noemi Chapel D [[id number]]  . oxyCODONE (ROXICODONE) 5 MG immediate release tablet    Sig: Take 1 tablet (5 mg total) by mouth every 4 (four) hours as needed for severe pain.    Dispense:  15 tablet    Refill:  0    Order Specific Question:  Supervising Provider    Answer:  Johnna Acosta [[id number]]     [place], PA-C 11/05/14 [place], MD 11/06/14 [phone]

## 2014-11-05 NOTE — ED Notes (Signed)
Pt from home c/o lower left abdominal pain and low back pain since yesterday. Hx of colitis. Has endorsed nausea and vomiting yesterday. Appt on feb 9 with PCP.

## 2014-11-05 NOTE — Discharge Instructions (Signed)
Use phenergan as directed as needed for nausea. Stay well hydrated with small sips of fluids. For your back use flexeril and use heat and massage to help with your pain. Use naprosyn and oxycodone as directed as needed for pain but don't drive while taking oxycodone. Follow up with your GI specialist on Tuesday. Return to the ER for changes or worsening of symptoms.  Abdominal (belly) pain can be caused by many things. Your caregiver performed an examination and possibly ordered blood/urine tests and imaging (CT scan, x-rays, ultrasound). Many cases can be observed and treated at home after initial evaluation in the emergency department. Even though you are being discharged home, abdominal pain can be unpredictable. Therefore, you need a repeated exam if your pain does not resolve, returns, or worsens. Most patients with abdominal pain don't have to be admitted to the hospital or have surgery, but serious problems like appendicitis and gallbladder attacks can start out as nonspecific pain. Many abdominal conditions cannot be diagnosed in one visit, so follow-up evaluations are very important. SEEK IMMEDIATE MEDICAL ATTENTION IF YOU DEVELOP ANY OF THE FOLLOWING SYMPTOMS:  The pain does not go away or becomes severe.   A temperature above 101 develops.   Repeated vomiting occurs (multiple episodes).   The pain becomes localized to portions of the abdomen. The right side could possibly be appendicitis. In an adult, the left lower portion of the abdomen could be colitis or diverticulitis.   Blood is being passed in stools or vomit (bright red or black tarry stools).   Return also if you develop chest pain, difficulty breathing, dizziness or fainting, or become confused, poorly responsive, or inconsolable (young children).  The constipation stays for more than 4 days.   There is belly (abdominal) or rectal pain.   You do not seem to be getting better.     Abdominal Pain Many things can cause  belly (abdominal) pain. Most times, the belly pain is not dangerous. Many cases of belly pain can be watched and treated at home. HOME CARE   Do not take medicines that help you go poop (laxatives) unless told to by your doctor.  Only take medicine as told by your doctor.  Eat or drink as told by your doctor. Your doctor will tell you if you should be on a special diet. GET HELP IF:  You do not know what is causing your belly pain.  You have belly pain while you are sick to your stomach (nauseous) or have runny poop (diarrhea).  You have pain while you pee or poop.  Your belly pain wakes you up at night.  You have belly pain that gets worse or better when you eat.  You have belly pain that gets worse when you eat fatty foods.  You have a fever. GET HELP RIGHT AWAY IF:   The pain does not go away within 2 hours.  You keep throwing up (vomiting).  The pain changes and is only in the right or left part of the belly.  You have bloody or tarry looking poop. MAKE SURE YOU:   Understand these instructions.  Will watch your condition.  Will get help right away if you are not doing well or get worse. Document Released: 03/25/2008 Document Revised: 10/12/2013 Document Reviewed: 06/16/2013 [facility] Patient Information 2015 [place], [place]. This information is not intended to replace advice given to you by your health care provider. Make sure you discuss any questions you have with your health care provider.  Nausea and Vomiting Nausea is a sick feeling that often comes before throwing up (vomiting). Vomiting is a reflex where stomach contents come out of your mouth. Vomiting can cause severe loss of body fluids (dehydration). Children and elderly adults can become dehydrated quickly, especially if they also have diarrhea. Nausea and vomiting are symptoms of a condition or disease. It is important to find the cause of your symptoms. CAUSES   Direct irritation of the stomach lining.  This irritation can result from increased acid production (gastroesophageal reflux disease), infection, food poisoning, taking certain medicines (such as nonsteroidal anti-inflammatory drugs), alcohol use, or tobacco use.  Signals from the brain.These signals could be caused by a headache, heat exposure, an inner ear disturbance, increased pressure in the brain from injury, infection, a tumor, or a concussion, pain, emotional stimulus, or metabolic problems.  An obstruction in the gastrointestinal tract (bowel obstruction).  Illnesses such as diabetes, hepatitis, gallbladder problems, appendicitis, kidney problems, cancer, sepsis, atypical symptoms of a heart attack, or eating disorders.  Medical treatments such as chemotherapy and radiation.  Receiving medicine that makes you sleep (general anesthetic) during surgery. DIAGNOSIS Your caregiver may ask for tests to be done if the problems do not improve after a few days. Tests may also be done if symptoms are severe or if the reason for the nausea and vomiting is not clear. Tests may include:  Urine tests.  Blood tests.  Stool tests.  Cultures (to look for evidence of infection).  X-rays or other imaging studies. Test results can help your caregiver make decisions about treatment or the need for additional tests. TREATMENT You need to stay well hydrated. Drink frequently but in small amounts.You may wish to drink water, sports drinks, clear broth, or eat frozen ice pops or gelatin dessert to help stay hydrated.When you eat, eating slowly may help prevent nausea.There are also some antinausea medicines that may help prevent nausea. HOME CARE INSTRUCTIONS   Take all medicine as directed by your caregiver.  If you do not have an appetite, do not force yourself to eat. However, you must continue to drink fluids.  If you have an appetite, eat a normal diet unless your caregiver tells you differently.  Eat a variety of complex  carbohydrates (rice, wheat, potatoes, bread), lean meats, yogurt, fruits, and vegetables.  Avoid high-fat foods because they are more difficult to digest.  Drink enough water and fluids to keep your urine clear or pale yellow.  If you are dehydrated, ask your caregiver for specific rehydration instructions. Signs of dehydration may include:  Severe thirst.  Dry lips and mouth.  Dizziness.  Dark urine.  Decreasing urine frequency and amount.  Confusion.  Rapid breathing or pulse. SEEK IMMEDIATE MEDICAL CARE IF:   You have blood or brown flecks (like coffee grounds) in your vomit.  You have black or bloody stools.  You have a severe headache or stiff neck.  You are confused.  You have severe abdominal pain.  You have chest pain or trouble breathing.  You do not urinate at least once every 8 hours.  You develop cold or clammy skin.  You continue to vomit for longer than 24 to 48 hours.  You have a fever. MAKE SURE YOU:   Understand these instructions.  Will watch your condition.  Will get help right away if you are not doing well or get worse. Document Released: 10/07/2005 Document Revised: 12/30/2011 Document Reviewed: 03/06/2011 [facility] Patient Information 2015 [place], [place]. This information is not  intended to replace advice given to you by your health care provider. Make sure you discuss any questions you have with your health care provider.  Musculoskeletal Pain Musculoskeletal pain is muscle and boney aches and pains. These pains can occur in any part of the body. Your caregiver may treat you without knowing the cause of the pain. They may treat you if blood or urine tests, X-rays, and other tests were normal.  CAUSES There is often not a definite cause or reason for these pains. These pains may be caused by a type of germ (virus). The discomfort may also come from overuse. Overuse includes working out too hard when your body is not fit. Boney aches also  come from weather changes. Bone is sensitive to atmospheric pressure changes. HOME CARE INSTRUCTIONS   Ask when your test results will be ready. Make sure you get your test results.  Only take over-the-counter or prescription medicines for pain, discomfort, or fever as directed by your caregiver. If you were given medications for your condition, do not drive, operate machinery or power tools, or sign legal documents for 24 hours. Do not drink alcohol. Do not take sleeping pills or other medications that may interfere with treatment.  Continue all activities unless the activities cause more pain. When the pain lessens, slowly resume normal activities. Gradually increase the intensity and duration of the activities or exercise.  During periods of severe pain, bed rest may be helpful. Lay or sit in any position that is comfortable.  Putting ice on the injured area.  Put ice in a bag.  Place a towel between your skin and the bag.  Leave the ice on for 15 to 20 minutes, 3 to 4 times a day.  Follow up with your caregiver for continued problems and no reason can be found for the pain. If the pain becomes worse or does not go away, it may be necessary to repeat tests or do additional testing. Your caregiver may need to look further for a possible cause. SEEK IMMEDIATE MEDICAL CARE IF:  You have pain that is getting worse and is not relieved by medications.  You develop chest pain that is associated with shortness or breath, sweating, feeling sick to your stomach (nauseous), or throw up (vomit).  Your pain becomes localized to the abdomen.  You develop any new symptoms that seem different or that concern you. MAKE SURE YOU:   Understand these instructions.  Will watch your condition.  Will get help right away if you are not doing well or get worse. Document Released: 10/07/2005 Document Revised: 12/30/2011 Document Reviewed: 06/11/2013 [facility] Patient Information 2015 [place], [place].  This information is not intended to replace advice given to you by your health care provider. Make sure you discuss any questions you have with your health care provider.

## 2014-11-06 LAB — URINE CULTURE: SPECIAL REQUESTS: NORMAL

## 2014-11-17 ENCOUNTER — Emergency Department (HOSPITAL_COMMUNITY)
Admission: EM | Admit: 2014-11-17 | Discharge: 2014-11-18 | Disposition: A | Discharge status: Home / Self Care | Payer: BLUE CROSS/BLUE SHIELD | Attending: Emergency Medicine | Admitting: Emergency Medicine

## 2014-11-17 ENCOUNTER — Encounter (HOSPITAL_COMMUNITY): Payer: Self-pay | Admitting: Emergency Medicine

## 2014-11-17 DIAGNOSIS — Z79899 Other long term (current) drug therapy: Secondary | ICD-10-CM | POA: Insufficient documentation

## 2014-11-17 DIAGNOSIS — F419 Anxiety disorder, unspecified: Secondary | ICD-10-CM | POA: Insufficient documentation

## 2014-11-17 DIAGNOSIS — Z8619 Personal history of other infectious and parasitic diseases: Secondary | ICD-10-CM | POA: Insufficient documentation

## 2014-11-17 DIAGNOSIS — G40909 Epilepsy, unspecified, not intractable, without status epilepticus: Secondary | ICD-10-CM | POA: Diagnosis not present

## 2014-11-17 DIAGNOSIS — R1084 Generalized abdominal pain: Secondary | ICD-10-CM | POA: Diagnosis present

## 2014-11-17 DIAGNOSIS — G8929 Other chronic pain: Secondary | ICD-10-CM | POA: Insufficient documentation

## 2014-11-17 DIAGNOSIS — G43909 Migraine, unspecified, not intractable, without status migrainosus: Secondary | ICD-10-CM | POA: Insufficient documentation

## 2014-11-17 DIAGNOSIS — Z791 Long term (current) use of non-steroidal anti-inflammatories (NSAID): Secondary | ICD-10-CM | POA: Insufficient documentation

## 2014-11-17 DIAGNOSIS — Z880 Allergy status to penicillin: Secondary | ICD-10-CM | POA: Diagnosis not present

## 2014-11-17 DIAGNOSIS — F329 Major depressive disorder, single episode, unspecified: Secondary | ICD-10-CM | POA: Diagnosis not present

## 2014-11-17 DIAGNOSIS — Z8639 Personal history of other endocrine, nutritional and metabolic disease: Secondary | ICD-10-CM | POA: Insufficient documentation

## 2014-11-17 DIAGNOSIS — I10 Essential (primary) hypertension: Secondary | ICD-10-CM | POA: Insufficient documentation

## 2014-11-17 DIAGNOSIS — Z8739 Personal history of other diseases of the musculoskeletal system and connective tissue: Secondary | ICD-10-CM | POA: Insufficient documentation

## 2014-11-17 DIAGNOSIS — Z9089 Acquired absence of other organs: Secondary | ICD-10-CM | POA: Insufficient documentation

## 2014-11-17 DIAGNOSIS — Z9889 Other specified postprocedural states: Secondary | ICD-10-CM | POA: Diagnosis not present

## 2014-11-17 DIAGNOSIS — Z8719 Personal history of other diseases of the digestive system: Secondary | ICD-10-CM | POA: Insufficient documentation

## 2014-11-17 DIAGNOSIS — Z8742 Personal history of other diseases of the female genital tract: Secondary | ICD-10-CM | POA: Insufficient documentation

## 2014-11-17 LAB — CBC WITH DIFFERENTIAL/PLATELET
BASOS ABS: 0 10*3/uL (ref 0.0–0.1)
BASOS PCT: 0 % (ref 0–1)
EOS ABS: 0 10*3/uL (ref 0.0–0.7)
EOS PCT: 1 % (ref 0–5)
HEMATOCRIT: 39.2 % (ref 36.0–46.0)
HEMOGLOBIN: 12.3 g/dL (ref 12.0–15.0)
LYMPHS ABS: 3.7 10*3/uL (ref 0.7–4.0)
Lymphocytes Relative: 45 % (ref 12–46)
MCH: 25.4 pg — ABNORMAL LOW (ref 26.0–34.0)
MCHC: 31.4 g/dL (ref 30.0–36.0)
MCV: 81 fL (ref 78.0–100.0)
MONO ABS: 0.9 10*3/uL (ref 0.1–1.0)
Monocytes Relative: 12 % (ref 3–12)
NEUTROS PCT: 42 % — AB (ref 43–77)
Neutro Abs: 3.4 10*3/uL (ref 1.7–7.7)
Platelets: 344 10*3/uL (ref 150–400)
RBC: 4.84 MIL/uL (ref 3.87–5.11)
RDW: 15.6 % — ABNORMAL HIGH (ref 11.5–15.5)
WBC: 8.1 10*3/uL (ref 4.0–10.5)

## 2014-11-17 LAB — URINALYSIS, DIPSTICK ONLY
BILIRUBIN URINE: NEGATIVE
GLUCOSE, UA: NEGATIVE mg/dL
HGB URINE DIPSTICK: NEGATIVE
KETONES UR: NEGATIVE mg/dL
Leukocytes, UA: NEGATIVE
NITRITE: NEGATIVE
Protein, ur: NEGATIVE mg/dL
Specific Gravity, Urine: 1.029 (ref 1.005–1.030)
UROBILINOGEN UA: 1 mg/dL (ref 0.0–1.0)
pH: 6 (ref 5.0–8.0)

## 2014-11-17 LAB — BASIC METABOLIC PANEL
Anion gap: 11 (ref 5–15)
BUN: 15 mg/dL (ref 6–23)
CO2: 27 mmol/L (ref 19–32)
Calcium: 9.1 mg/dL (ref 8.4–10.5)
Chloride: 102 mmol/L (ref 96–112)
Creatinine, Ser: 0.57 mg/dL (ref 0.50–1.10)
GFR calc Af Amer: >90 mL/min (ref >90)
GFR calc non Af Amer: >90 mL/min (ref >90)
Glucose, Bld: 106 mg/dL — ABNORMAL HIGH (ref 70–99)
POTASSIUM: 3 mmol/L — AB (ref 3.5–5.1)
Sodium: 140 mmol/L (ref 135–145)

## 2014-11-17 LAB — LIPASE, BLOOD: LIPASE: 27 U/L (ref 11–59)

## 2014-11-17 MED ORDER — MORPHINE SULFATE 4 MG/ML IJ SOLN
4.0000 mg | Freq: Once | INTRAMUSCULAR | Status: AC
  Administered 2014-11-17: 4 mg via INTRAVENOUS
  Filled 2014-11-17: qty 1

## 2014-11-17 MED ORDER — POTASSIUM CHLORIDE CRYS ER 20 MEQ PO TBCR
40.0000 meq | EXTENDED_RELEASE_TABLET | Freq: Once | ORAL | Status: AC
Start: 2014-11-18 — End: 2014-11-18
  Administered 2014-11-18: 40 meq via ORAL
  Filled 2014-11-17: qty 2

## 2014-11-17 MED ORDER — ONDANSETRON HCL 4 MG/2ML IJ SOLN
4.0000 mg | Freq: Once | INTRAMUSCULAR | Status: AC
  Administered 2014-11-17: 4 mg via INTRAVENOUS
  Filled 2014-11-17: qty 2

## 2014-11-17 NOTE — ED Provider Notes (Signed)
CSN: [id number]     Arrival date & time 11/17/14  2156 History   First MD Initiated Contact with Patient 11/17/14 2215     Chief Complaint  Patient presents with  . Abdominal Pain     (Consider location/radiation/quality/duration/timing/severity/associated sxs/prior Treatment) HPI Comments: Patient with a history of Ulcerative Colitis, Depression, Anxiety, IBS, recurrent nausea and vomiting, and Diverticulosis presents today with diffuse abdominal pain.  She reports that the pain has been intermittent over the past 2 months.  She states that she has seen her PCP, Gastroenterologist,  and was also seen  in the ED for this same pain.  She was seen in the ED on 11/05/14 for similar pain.  At that time she had a CT ab/pelvis, which was negative.  She reports that her pain today is the same pain that she had at that time.  She reports that her GI physician, DR. Penelope Coop, felt that the problem may be a gynecological issue.  She has an appointment with her OB/GYN tomorrow morning.  She has not taken anything for the pain. She reports that she has had intermittent nausea and vomiting over the past 4 days.  She had one episode of vomiting earlier today.  She denies diarrhea or constipation.  She states that she had a BM yesterday, which was normal.  She denies fever, chills, hematochezia, melena, or urinary symptoms.  Patient is a 59 y.o. female presenting with abdominal pain. The history is provided by the patient.  Abdominal Pain   Past Medical History  Diagnosis Date  . Hypertension   . Ulcerative colitis   . Seizure disorder   . HTN (hypertension), benign 01/03/2012  . Depression 01/03/2012  . 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 Hyperlipidemia 01/03/2012  . Fibroids 01/03/2012  . Chest pain, exertional   . SLE (systemic lupus erythematosus)   . GERD (gastroesophageal reflux disease)   . Hepatitis C     "signs of; not active" (05/28/2013)  . Migraines     "a few times/yr" (05/28/2013)  . Seizure    "today; < 1 yr ago, they just happen; started having them in my 20's" (05/28/2013)  . Chronic lower back pain   . Anxiety   . Chronic abdominal pain   . IBS (irritable bowel syndrome)   . Nausea and vomiting     chronic, recurrent  . Diverticulosis    Past Surgical History  Procedure Laterality Date  . Cholecystectomy  1978  . Abdominal hysterectomy  1978    "partial" (05/28/2013)  . Bilateral oophorectomy Bilateral 1987  . Colonoscopy N/A 05/31/2013    Procedure: COLONOSCOPY;  Surgeon: Wonda Horner, MD;  Location: [facility] ENDOSCOPY;  Service: Endoscopy;  Laterality: N/A;  . Esophagogastroduodenoscopy (egd) with propofol N/A 05/11/2014    Procedure: ESOPHAGOGASTRODUODENOSCOPY (EGD) WITH PROPOFOL;  Surgeon: Lear Ng, MD;  Location: [facility] ENDOSCOPY;  Service: Endoscopy;  Laterality: N/A;  egd first  . Flexible sigmoidoscopy N/A 05/11/2014    Procedure: FLEXIBLE SIGMOIDOSCOPY;  Surgeon: Lear Ng, MD;  Location: [facility] ENDOSCOPY;  Service: Endoscopy;  Laterality: N/A;   Family History  Problem Relation Age of Onset  . Colon cancer Brother    History  Substance Use Topics  . Smoking status: Never Smoker   . Smokeless tobacco: Never Used  . Alcohol Use: Yes     Comment: 05/28/2013 "glass of wine rarely; weddings, etc"   OB History    No data available     Review of Systems  Gastrointestinal: Positive  for abdominal pain.  All other systems reviewed and are negative.     Allergies  Aspirin; Darvocet; Isometheptene-apap-dichloral; Penicillins; Tape; Wellbutrin; and Dilaudid  Home Medications   Prior to Admission medications   Medication Sig Start Date End Date Taking? Authorizing Provider  cyclobenzaprine (FLEXERIL) 5 MG tablet Take 1 tablet (5 mg total) by mouth 3 (three) times daily as needed for muscle spasms. 10/01/14  Yes Billy Fischer, MD  dicyclomine (BENTYL) 20 MG tablet Take 20 mg by mouth 4 (four) times daily -  before meals and at bedtime.   Yes Historical  Provider, MD  FLUoxetine (PROZAC) 20 MG capsule Take 20 mg by mouth every morning.   Yes Historical Provider, MD  levETIRAcetam (KEPPRA XR) 500 MG 24 hr tablet Take 500 mg by mouth 2 (two) times daily.    Yes Historical Provider, MD  Multiple Vitamin (MULTIVITAMIN WITH MINERALS) TABS tablet Take 1 tablet by mouth daily.   Yes Historical Provider, MD  NON FORMULARY Take 1 capsule by mouth daily as needed (relieve constipation).   Yes Historical Provider, MD  oxyCODONE (ROXICODONE) 5 MG immediate release tablet Take 1 tablet (5 mg total) by mouth every 4 (four) hours as needed for severe pain. 11/05/14  Yes Mercedes Strupp Camprubi-Soms, PA-C  Phenylephrine-Pheniramine-DM [facility] COLD & COUGH PO) Take 1 packet by mouth daily as needed (flu & fever symptoms).   Yes Historical Provider, MD  promethazine (PHENERGAN) 25 MG tablet Take 1 tablet (25 mg total) by mouth every 6 (six) hours as needed for nausea or vomiting. 11/05/14  Yes Mercedes Strupp Camprubi-Soms, PA-C  Propylene Glycol (SYSTANE BALANCE OP) Place 2 drops into both eyes 2 (two) times daily as needed (dry eyes).    Yes Historical Provider, MD  triamterene-hydrochlorothiazide (MAXZIDE-25) 37.5-25 MG per tablet Take 1 tablet by mouth daily. 06/16/14  Yes Historical Provider, MD  HYDROcodone-acetaminophen (NORCO/VICODIN) 5-325 MG per tablet 1 or 2 tabs PO q6 hours prn pain 08/17/14   Francine Graven, DO  naproxen (NAPROSYN) 500 MG tablet Take 1 tablet (500 mg total) by mouth 2 (two) times daily as needed for mild pain, moderate pain or headache (TAKE WITH MEALS.). Patient not taking: Reported on 11/17/2014 11/05/14   Patty Sermons Camprubi-Soms, PA-C  oxyCODONE-acetaminophen (PERCOCET/ROXICET) 5-325 MG per tablet Take 1 tablet by mouth every 6 (six) hours as needed for moderate pain or severe pain. 10/01/14   Billy Fischer, MD  promethazine (PHENERGAN) 25 MG tablet Take 1 tablet (25 mg total) by mouth every 6 (six) hours as needed for nausea or  vomiting. Patient not taking: Reported on 11/17/2014 08/17/14   Francine Graven, DO   BP 124/88 mmHg  Pulse 89  Temp(Src) 97.7 F (36.5 C) (Oral)  Resp 20  SpO2 97% Physical Exam  Constitutional: She appears well-developed and well-nourished.  HENT:  Head: Normocephalic and atraumatic.  Mouth/Throat: Oropharynx is clear and moist.  Neck: Normal range of motion. Neck supple.  Cardiovascular: Normal rate, regular rhythm and normal heart sounds.   Pulmonary/Chest: Effort normal and breath sounds normal.  Abdominal: Soft. Bowel sounds are normal. She exhibits no distension and no mass. There is generalized tenderness. There is no rebound and no guarding.    Musculoskeletal: Normal range of motion.  Neurological: She is alert.  Skin: Skin is warm and dry.  Psychiatric: She has a normal mood and affect.  Nursing note and vitals reviewed.   ED Course  Procedures (including critical care time) Labs Review Labs Reviewed  CBC WITH DIFFERENTIAL/PLATELET - Abnormal; Notable for the following:    MCH 25.4 (*)    RDW 15.6 (*)    Neutrophils Relative % 42 (*)    All other components within normal limits  BASIC METABOLIC PANEL  URINALYSIS, DIPSTICK ONLY  LIPASE, BLOOD    Imaging Review No results found.   EKG Interpretation None     12:05 AM Reassessed patient.  She reports that her pain has not improved.   1:00 AM Patient reports that pain has improved at this time.  Patient tolerating PO liquids.  Abdomen soft with mild diffuse tenderness to palpation.  No rebound or guarding.   MDM   Final diagnoses:  None   Patient with a history of Ulcerative Colitis, Depression, Anxiety, IBS, recurrent nausea and vomiting, and Diverticulosis presents today with diffuse abdominal pain  She reports that the pain has been intermittent over the past 2 months and that the pain today is the same pain that she has had in the past.  Patient was seen in the ED 11/05/14 for the same pain and had a  CT scan at that time.  The patient did not want another CT scan today.  Abdomen soft and non distended.  No rebound or guarding on exam.  Labs unremarkable aside from mild Hypokalemia.  No leukocytosis.  Pain and nausea improved while in the ED.  She has seen her GI physician for this pain.  She reports that she has an appointment with OB/GYN in the morning.  Feel that the patient is stable for discharge.  Return precautions given.    Hyman Bible, PA-C 11/20/14 [id number]  Blanchie Dessert, MD 11/21/14 1459

## 2014-11-17 NOTE — ED Notes (Signed)
Pt is c/o abd pain  Pt states she feels like there is a knot in her stomach  Pt states she has an old abd incision that has been itching so she has been scratching it and now it is bleeding  Pt states last time she was here she had a CT that did not show anything  Pt states she has a hx of colitis

## 2014-11-18 MED ORDER — LORAZEPAM 2 MG/ML IJ SOLN
1.0000 mg | Freq: Once | INTRAMUSCULAR | Status: AC
  Administered 2014-11-18: 1 mg via INTRAVENOUS
  Filled 2014-11-18: qty 1

## 2014-11-18 MED ORDER — OXYCODONE HCL 5 MG PO TABS: 5.0000 mg | ORAL_TABLET | ORAL | Status: DC | PRN

## 2014-11-18 MED ORDER — LORAZEPAM 1 MG PO TABS: 1.0000 mg | ORAL_TABLET | Freq: Three times a day (TID) | ORAL | Status: DC | PRN

## 2014-11-18 MED ORDER — ONDANSETRON HCL 4 MG PO TABS: 4.0000 mg | ORAL_TABLET | Freq: Four times a day (QID) | ORAL | Status: DC

## 2014-11-18 MED ORDER — MORPHINE SULFATE 4 MG/ML IJ SOLN
4.0000 mg | Freq: Once | INTRAMUSCULAR | Status: AC
  Administered 2014-11-18: 4 mg via INTRAVENOUS
  Filled 2014-11-18: qty 1

## 2014-11-22 DIAGNOSIS — I10 Essential (primary) hypertension: Secondary | ICD-10-CM | POA: Diagnosis not present

## 2014-11-22 DIAGNOSIS — R569 Unspecified convulsions: Secondary | ICD-10-CM | POA: Diagnosis not present

## 2014-11-22 DIAGNOSIS — F329 Major depressive disorder, single episode, unspecified: Secondary | ICD-10-CM | POA: Diagnosis not present

## 2014-11-22 DIAGNOSIS — E780 Pure hypercholesterolemia: Secondary | ICD-10-CM | POA: Diagnosis not present

## 2014-11-22 DIAGNOSIS — K5190 Ulcerative colitis, unspecified, without complications: Secondary | ICD-10-CM | POA: Diagnosis not present

## 2014-11-22 DIAGNOSIS — G43001 Migraine without aura, not intractable, with status migrainosus: Secondary | ICD-10-CM | POA: Diagnosis not present

## 2014-11-22 DIAGNOSIS — M545 Low back pain: Secondary | ICD-10-CM | POA: Diagnosis not present

## 2014-11-22 DIAGNOSIS — F419 Anxiety disorder, unspecified: Secondary | ICD-10-CM | POA: Diagnosis not present

## 2014-11-22 DIAGNOSIS — S30811A Abrasion of abdominal wall, initial encounter: Secondary | ICD-10-CM | POA: Diagnosis not present

## 2014-11-24 DIAGNOSIS — N390 Urinary tract infection, site not specified: Secondary | ICD-10-CM | POA: Diagnosis not present

## 2014-11-24 DIAGNOSIS — R5383 Other fatigue: Secondary | ICD-10-CM | POA: Diagnosis not present

## 2014-12-20 DIAGNOSIS — H2513 Age-related nuclear cataract, bilateral: Secondary | ICD-10-CM | POA: Diagnosis not present

## 2014-12-20 DIAGNOSIS — H04123 Dry eye syndrome of bilateral lacrimal glands: Secondary | ICD-10-CM | POA: Diagnosis not present

## 2015-01-19 ENCOUNTER — Emergency Department (HOSPITAL_COMMUNITY)
Admission: EM | Admit: 2015-01-19 | Discharge: 2015-01-19 | Disposition: A | Discharge status: Home / Self Care | Payer: BLUE CROSS/BLUE SHIELD | Attending: Emergency Medicine | Admitting: Emergency Medicine

## 2015-01-19 ENCOUNTER — Emergency Department (HOSPITAL_COMMUNITY): Payer: BLUE CROSS/BLUE SHIELD

## 2015-01-19 ENCOUNTER — Encounter (HOSPITAL_COMMUNITY): Payer: Self-pay

## 2015-01-19 DIAGNOSIS — Z79899 Other long term (current) drug therapy: Secondary | ICD-10-CM | POA: Diagnosis not present

## 2015-01-19 DIAGNOSIS — Z8742 Personal history of other diseases of the female genital tract: Secondary | ICD-10-CM | POA: Insufficient documentation

## 2015-01-19 DIAGNOSIS — Z880 Allergy status to penicillin: Secondary | ICD-10-CM | POA: Diagnosis not present

## 2015-01-19 DIAGNOSIS — Z8719 Personal history of other diseases of the digestive system: Secondary | ICD-10-CM | POA: Diagnosis not present

## 2015-01-19 DIAGNOSIS — R112 Nausea with vomiting, unspecified: Secondary | ICD-10-CM | POA: Insufficient documentation

## 2015-01-19 DIAGNOSIS — R1030 Lower abdominal pain, unspecified: Secondary | ICD-10-CM | POA: Insufficient documentation

## 2015-01-19 DIAGNOSIS — K5730 Diverticulosis of large intestine without perforation or abscess without bleeding: Secondary | ICD-10-CM | POA: Diagnosis not present

## 2015-01-19 DIAGNOSIS — Z8619 Personal history of other infectious and parasitic diseases: Secondary | ICD-10-CM | POA: Diagnosis not present

## 2015-01-19 DIAGNOSIS — Z4801 Encounter for change or removal of surgical wound dressing: Secondary | ICD-10-CM | POA: Insufficient documentation

## 2015-01-19 DIAGNOSIS — G43909 Migraine, unspecified, not intractable, without status migrainosus: Secondary | ICD-10-CM | POA: Diagnosis not present

## 2015-01-19 DIAGNOSIS — I10 Essential (primary) hypertension: Secondary | ICD-10-CM | POA: Insufficient documentation

## 2015-01-19 DIAGNOSIS — Z8739 Personal history of other diseases of the musculoskeletal system and connective tissue: Secondary | ICD-10-CM | POA: Diagnosis not present

## 2015-01-19 DIAGNOSIS — Z9079 Acquired absence of other genital organ(s): Secondary | ICD-10-CM | POA: Insufficient documentation

## 2015-01-19 DIAGNOSIS — Z9089 Acquired absence of other organs: Secondary | ICD-10-CM | POA: Insufficient documentation

## 2015-01-19 DIAGNOSIS — F329 Major depressive disorder, single episode, unspecified: Secondary | ICD-10-CM | POA: Insufficient documentation

## 2015-01-19 DIAGNOSIS — G8929 Other chronic pain: Secondary | ICD-10-CM | POA: Insufficient documentation

## 2015-01-19 DIAGNOSIS — K5900 Constipation, unspecified: Secondary | ICD-10-CM | POA: Diagnosis not present

## 2015-01-19 DIAGNOSIS — Z90710 Acquired absence of both cervix and uterus: Secondary | ICD-10-CM | POA: Insufficient documentation

## 2015-01-19 DIAGNOSIS — F419 Anxiety disorder, unspecified: Secondary | ICD-10-CM | POA: Diagnosis not present

## 2015-01-19 DIAGNOSIS — K760 Fatty (change of) liver, not elsewhere classified: Secondary | ICD-10-CM | POA: Diagnosis not present

## 2015-01-19 DIAGNOSIS — G40909 Epilepsy, unspecified, not intractable, without status epilepticus: Secondary | ICD-10-CM | POA: Insufficient documentation

## 2015-01-19 DIAGNOSIS — Z9049 Acquired absence of other specified parts of digestive tract: Secondary | ICD-10-CM | POA: Diagnosis not present

## 2015-01-19 DIAGNOSIS — R109 Unspecified abdominal pain: Secondary | ICD-10-CM

## 2015-01-19 LAB — CBC WITH DIFFERENTIAL/PLATELET
BASOS ABS: 0 10*3/uL (ref 0.0–0.1)
BASOS PCT: 0 % (ref 0–1)
EOS ABS: 0 10*3/uL (ref 0.0–0.7)
Eosinophils Relative: 1 % (ref 0–5)
HCT: 40.4 % (ref 36.0–46.0)
Hemoglobin: 13 g/dL (ref 12.0–15.0)
Lymphocytes Relative: 53 % — ABNORMAL HIGH (ref 12–46)
Lymphs Abs: 2.2 10*3/uL (ref 0.7–4.0)
MCH: 25.8 pg — AB (ref 26.0–34.0)
MCHC: 32.2 g/dL (ref 30.0–36.0)
MCV: 80.2 fL (ref 78.0–100.0)
Monocytes Absolute: 0.5 10*3/uL (ref 0.1–1.0)
Monocytes Relative: 12 % (ref 3–12)
NEUTROS ABS: 1.4 10*3/uL — AB (ref 1.7–7.7)
Neutrophils Relative %: 34 % — ABNORMAL LOW (ref 43–77)
PLATELETS: 313 10*3/uL (ref 150–400)
RBC: 5.04 MIL/uL (ref 3.87–5.11)
RDW: 15.7 % — AB (ref 11.5–15.5)
WBC: 4.1 10*3/uL (ref 4.0–10.5)

## 2015-01-19 LAB — COMPREHENSIVE METABOLIC PANEL
ALBUMIN: 3.8 g/dL (ref 3.5–5.2)
ALT: 15 U/L (ref 0–35)
ANION GAP: 9 (ref 5–15)
AST: 31 U/L (ref 0–37)
Alkaline Phosphatase: 46 U/L (ref 39–117)
BILIRUBIN TOTAL: 1 mg/dL (ref 0.3–1.2)
BUN: 10 mg/dL (ref 6–23)
CHLORIDE: 102 mmol/L (ref 96–112)
CO2: 25 mmol/L (ref 19–32)
Calcium: 9 mg/dL (ref 8.4–10.5)
Creatinine, Ser: 0.68 mg/dL (ref 0.50–1.10)
GFR calc Af Amer: >90 mL/min (ref >90)
GFR calc non Af Amer: >90 mL/min (ref >90)
Glucose, Bld: 99 mg/dL (ref 70–99)
Potassium: 4.5 mmol/L (ref 3.5–5.1)
SODIUM: 136 mmol/L (ref 135–145)
TOTAL PROTEIN: 7.6 g/dL (ref 6.0–8.3)

## 2015-01-19 LAB — URINALYSIS, ROUTINE W REFLEX MICROSCOPIC
Bilirubin Urine: NEGATIVE
Glucose, UA: NEGATIVE mg/dL
HGB URINE DIPSTICK: NEGATIVE
Ketones, ur: NEGATIVE mg/dL
Leukocytes, UA: NEGATIVE
NITRITE: NEGATIVE
PROTEIN: NEGATIVE mg/dL
SPECIFIC GRAVITY, URINE: 1.027 (ref 1.005–1.030)
Urobilinogen, UA: 0.2 mg/dL (ref 0.0–1.0)
pH: 6 (ref 5.0–8.0)

## 2015-01-19 LAB — LIPASE, BLOOD: Lipase: 23 U/L (ref 11–59)

## 2015-01-19 MED ORDER — IOHEXOL 300 MG/ML  SOLN
50.0000 mL | Freq: Once | INTRAMUSCULAR | Status: AC | PRN
  Administered 2015-01-19: 50 mL via ORAL

## 2015-01-19 MED ORDER — ONDANSETRON HCL 4 MG/2ML IJ SOLN
4.0000 mg | Freq: Once | INTRAMUSCULAR | Status: AC
  Administered 2015-01-19: 4 mg via INTRAVENOUS
  Filled 2015-01-19: qty 2

## 2015-01-19 MED ORDER — ONDANSETRON HCL 4 MG PO TABS: 4.0000 mg | ORAL_TABLET | Freq: Four times a day (QID) | ORAL | Status: DC

## 2015-01-19 MED ORDER — OXYCODONE-ACETAMINOPHEN 5-325 MG PO TABS
2.0000 | ORAL_TABLET | Freq: Once | ORAL | Status: AC
  Administered 2015-01-19: 2 via ORAL
  Filled 2015-01-19: qty 2

## 2015-01-19 MED ORDER — FENTANYL CITRATE 0.05 MG/ML IJ SOLN
50.0000 ug | Freq: Once | INTRAMUSCULAR | Status: AC
  Administered 2015-01-19: 50 ug via INTRAVENOUS
  Filled 2015-01-19: qty 2

## 2015-01-19 MED ORDER — IOHEXOL 300 MG/ML  SOLN
100.0000 mL | Freq: Once | INTRAMUSCULAR | Status: AC | PRN
  Administered 2015-01-19: 100 mL via INTRAVENOUS

## 2015-01-19 NOTE — ED Provider Notes (Signed)
CSN: [id number]     Arrival date & time 01/19/15  0706 History   First MD Initiated Contact with Patient 01/19/15 [phone]     Chief Complaint  Patient presents with  . Abdominal Pain  . Emesis  . Wound Check     (Consider location/radiation/quality/duration/timing/severity/associated sxs/prior Treatment) HPI Comments: Patient with lower abdominal cramping and nausea for the past 5 days. Pain is constant and similar to her usual chronic abdominal pain for which she takes Percocet. She has had nausea but no vomiting. No fever. Pain is worse with palpation and bending over. She has a poor appetite. Patient with a history of ulcerative colitis as well as IBS not on chronic medications. She also has a history of chronic abdominal pain. She denies any chest pain or shortness of breath. She denies any urinary symptoms. She's had a previous hysterectomy and nephrectomy and cholecystectomy. Last colonoscopy by her report was normal. She also has a wound to her lower abdomen that has been there for 4-5 months and she is scheduled to see the surgeon next week. This wound is from her surgical incision in 2004. It is not draining but bleeds minimally occasionally.  Patient is a 59 y.o. female presenting with vomiting and wound check. The history is provided by the patient.  Emesis Associated symptoms: abdominal pain   Associated symptoms: no arthralgias, no headaches and no myalgias   Wound Check Associated symptoms include abdominal pain. Pertinent negatives include no chest pain, no headaches and no shortness of breath.    Past Medical History  Diagnosis Date  . Hypertension   . Ulcerative colitis   . Seizure disorder   . HTN (hypertension), benign 01/03/2012  . Depression 01/03/2012  . 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 Hyperlipidemia 01/03/2012  . Fibroids 01/03/2012  . Chest pain, exertional   . SLE (systemic lupus erythematosus)   . GERD (gastroesophageal reflux disease)   . Hepatitis C     "signs  of; not active" (05/28/2013)  . Migraines     "a few times/yr" (05/28/2013)  . Seizure     "today; < 1 yr ago, they just happen; started having them in my 20's" (05/28/2013)  . Chronic lower back pain   . Anxiety   . Chronic abdominal pain   . IBS (irritable bowel syndrome)   . Nausea and vomiting     chronic, recurrent  . Diverticulosis    Past Surgical History  Procedure Laterality Date  . Cholecystectomy  1978  . Abdominal hysterectomy  1978    "partial" (05/28/2013)  . Bilateral oophorectomy Bilateral 1987  . Colonoscopy N/A 05/31/2013    Procedure: COLONOSCOPY;  Surgeon: Wonda Horner, MD;  Location[facility] ENDOSCOPY;  Service: Endoscopy;  Laterality: N/A;  . Esophagogastroduodenoscopy (egd) with propofol N/A 05/11/2014    Procedure: ESOPHAGOGASTRODUODENOSCOPY (EGD) WITH PROPOFOL;  Surgeon: Lear Ng, MD;  Location: [facility] ENDOSCOPY;  Service: Endoscopy;  Laterality: N/A;  egd first  . Flexible sigmoidoscopy N/A 05/11/2014    Procedure: FLEXIBLE SIGMOIDOSCOPY;  Surgeon: Lear Ng, MD;  Location: [facility] ENDOSCOPY;  Service: Endoscopy;  Laterality: N/A;   Family History  Problem Relation Age of Onset  . Colon cancer Brother    History  Substance Use Topics  . Smoking status: Never Smoker   . Smokeless tobacco: Never Used  . Alcohol Use: Yes     Comment: 05/28/2013 "glass of wine rarely; weddings, etc"   OB History    No data available  Review of Systems  Constitutional: Negative for fever, activity change and appetite change.  HENT: Negative for congestion and rhinorrhea.   Respiratory: Negative for cough, chest tightness and shortness of breath.   Cardiovascular: Negative for chest pain.  Gastrointestinal: Positive for nausea, vomiting and abdominal pain.  Genitourinary: Negative for dysuria, hematuria, vaginal bleeding and vaginal discharge.  Musculoskeletal: Negative for myalgias, back pain and arthralgias.  Skin: Negative for rash.  Neurological: Negative for  dizziness, weakness and headaches.  A complete 10 system review of systems was obtained and all systems are negative except as noted in the HPI and PMH.      Allergies  Aspirin; Darvocet; Isometheptene-apap-dichloral; Penicillins; Tape; Wellbutrin; and Dilaudid  Home Medications   Prior to Admission medications   Medication Sig Start Date End Date Taking? Authorizing Provider  cyclobenzaprine (FLEXERIL) 5 MG tablet Take 1 tablet (5 mg total) by mouth 3 (three) times daily as needed for muscle spasms. 10/01/14  Yes Billy Fischer, MD  dicyclomine (BENTYL) 20 MG tablet Take 20 mg by mouth 4 (four) times daily -  before meals and at bedtime.   Yes Historical Provider, MD  FLUoxetine (PROZAC) 20 MG capsule Take 20 mg by mouth every morning.   Yes Historical Provider, MD  levETIRAcetam (KEPPRA XR) 500 MG 24 hr tablet Take 500 mg by mouth 2 (two) times daily.    Yes Historical Provider, MD  LORazepam (ATIVAN) 1 MG tablet Take 1 tablet (1 mg total) by mouth 3 (three) times daily as needed for anxiety. 11/18/14  Yes [name] Laisure, PA-C  Multiple Vitamin (MULTIVITAMIN WITH MINERALS) TABS tablet Take 1 tablet by mouth daily.   Yes Historical Provider, MD  NON FORMULARY Take 1 capsule by mouth daily as needed (relieve constipation).   Yes Historical Provider, MD  oxyCODONE (ROXICODONE) 5 MG immediate release tablet Take 1 tablet (5 mg total) by mouth every 4 (four) hours as needed for severe pain. 11/18/14  Yes [name] Laisure, PA-C  promethazine (PHENERGAN) 25 MG tablet Take 1 tablet (25 mg total) by mouth every 6 (six) hours as needed for nausea or vomiting. 11/05/14  Yes Mercedes Camprubi-Soms, PA-C  Propylene Glycol (SYSTANE BALANCE OP) Place 2 drops into both eyes 2 (two) times daily as needed (dry eyes).    Yes Historical Provider, MD  triamterene-hydrochlorothiazide (MAXZIDE-25) 37.5-25 MG per tablet Take 1 tablet by mouth daily. 06/16/14  Yes Historical Provider, MD  HYDROcodone-acetaminophen  (NORCO/VICODIN) 5-325 MG per tablet 1 or 2 tabs PO q6 hours prn pain Patient not taking: Reported on 01/19/2015 08/17/14   Francine Graven, DO  naproxen (NAPROSYN) 500 MG tablet Take 1 tablet (500 mg total) by mouth 2 (two) times daily as needed for mild pain, moderate pain or headache (TAKE WITH MEALS.). Patient not taking: Reported on 11/17/2014 11/05/14   Mercedes Camprubi-Soms, PA-C  ondansetron (ZOFRAN) 4 MG tablet Take 1 tablet (4 mg total) by mouth every 6 (six) hours. 01/19/15   Ezequiel Essex, MD   BP 131/71 mmHg  Pulse 71  Temp(Src) 98.2 F (36.8 C) (Oral)  Resp 16  SpO2 100% Physical Exam  Constitutional: She is oriented to person, place, and time. She appears well-developed and well-nourished. No distress.  HENT:  Head: Normocephalic and atraumatic.  Mouth/Throat: Oropharynx is clear and moist. No oropharyngeal exudate.  Eyes: Conjunctivae and EOM are normal. Pupils are equal, round, and reactive to light.  Neck: Normal range of motion. Neck supple.  No meningismus.  Cardiovascular: Normal rate, regular rhythm,  normal heart sounds and intact distal pulses.   No murmur heard. Pulmonary/Chest: Effort normal and breath sounds normal. No respiratory distress.  Abdominal: Soft. There is tenderness. There is no rebound and no guarding.  Mild lower abdominal tenderness Hyperpigmented scar in midline with small area of excoriation. No drainage.  Musculoskeletal: Normal range of motion. She exhibits no edema or tenderness.  Neurological: She is alert and oriented to person, place, and time. No cranial nerve deficit. She exhibits normal muscle tone. Coordination normal.  No ataxia on finger to nose bilaterally. No pronator drift. 5/5 strength throughout. CN 2-12 intact. Negative Romberg. Equal grip strength. Sensation intact. Gait is normal.   Skin: Skin is warm.  Psychiatric: She has a normal mood and affect. Her behavior is normal.  Nursing note and vitals reviewed.   ED Course   Procedures (including critical care time) Labs Review Labs Reviewed  CBC WITH DIFFERENTIAL/PLATELET - Abnormal; Notable for the following:    MCH 25.8 (*)    RDW 15.7 (*)    Neutrophils Relative % 34 (*)    Neutro Abs 1.4 (*)    Lymphocytes Relative 53 (*)    All other components within normal limits  URINALYSIS, ROUTINE W REFLEX MICROSCOPIC - Abnormal; Notable for the following:    APPearance CLOUDY (*)    All other components within normal limits  COMPREHENSIVE METABOLIC PANEL  LIPASE, BLOOD    Imaging Review Ct Abdomen Pelvis W Contrast  01/19/2015   CLINICAL DATA:  59 year old female with mid abdominal pain and vomiting for 4 days. Current history of ulcerative colitis. Subsequent encounter.  EXAM: CT ABDOMEN AND PELVIS WITH CONTRAST  TECHNIQUE: Multidetector CT imaging of the abdomen and pelvis was performed using the standard protocol following bolus administration of intravenous contrast.  CONTRAST:  115mL OMNIPAQUE IOHEXOL 300 MG/ML  SOLN  COMPARISON:  CT Abdomen and Pelvis 11/05/2014 and earlier.  FINDINGS: Negative lung bases.  No pericardial or pleural effusion.  Advanced disc and endplate degeneration in the lower lumbar spine. No acute osseous abnormality identified.  Uterus and adnexa appear surgically absent as before. No pelvic free fluid. Diminutive bladder.  Negative rectum. Negative sigmoid colon aside from redundancy. Diverticulosis of the left colon with no active inflammation. Negative splenic flexure. Negative transverse colon. Retained stool in the right colon which otherwise appears normal. Contrast has just reached the cecum and ileocecal valve. No appendix or pericecal inflammation identified. The terminal ileum appears normal. No dilated or inflamed small bowel loops. Negative stomach and duodenum.  Surgically absent gallbladder. Mildly decreased density throughout the liver. Spleen, pancreas and right adrenal gland are normal. Portal venous system is patent. Major  arterial structures are patent. 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 Bilateral renal enhancement and contrast excretion is normal. No abdominal free fluid. No inflammatory stranding. No free air.  IMPRESSION: Stable and largely unremarkable CT Abdomen and Pelvis. No abnormal bowel identified. No acute or inflammatory process. Hepatic steatosis.   Electronically Signed   By: Genevie Ann M.D.   On: 01/19/2015 09:30     EKG Interpretation None      MDM   Final diagnoses:  Abdominal pain, unspecified abdominal location   acute on chronic abdominal pain with nausea. Lower abdominal wound is chronic and appears to be irritated from scratching. No evidence of infection.  Labs and urinalysis negative. Patient tolerating by mouth. CT scan is not show any acute pathology. Clinically there is no evidence of  wound infection.  Seems to be exacerbation of chronic abdominal pain. Patient understands that only her PCP can provide pain medication. She declined nausea medication. Return precautions discussed.   Ezequiel Essex, MD 01/19/15 [phone]

## 2015-01-19 NOTE — ED Notes (Signed)
Pt given ginger ale and graham crackers per her request.

## 2015-01-19 NOTE — ED Notes (Signed)
Patient transported to CT 

## 2015-01-19 NOTE — ED Notes (Signed)
Patient is aware we need urine- is going to drink contrast and then try-per Ajsa,RN

## 2015-01-19 NOTE — ED Notes (Signed)
Pt c/o lower abdominal cramping, n/v, and fatigue x  4 days and opening to old wound site x 1 day.  Pain score 10/10.  Denies pain.  Pt reports "bending and sitting for too long" make pain worse.  Pt has not taken anything for pain.  Hx of chronic abdominal pain.  Pt reports abdominal scar from 2004 surgery reopened yesterday.  Pt has an appointment w/ surgeon on Friday.

## 2015-01-19 NOTE — Discharge Instructions (Signed)
Abdominal Pain Your testing is negative today for acute causing her abdominal pain. Follow-up with her doctor and take your pain medication as prescribed. Return to the ED if you develop if you develop new or worsening symptoms. Many things can cause abdominal pain. Usually, abdominal pain is not caused by a disease and will improve without treatment. It can often be observed and treated at home. Your health care provider will do a physical exam and possibly order blood tests and X-rays to help determine the seriousness of your pain. However, in many cases, more time must pass before a clear cause of the pain can be found. Before that point, your health care provider may not know if you need more testing or further treatment. HOME CARE INSTRUCTIONS  Monitor your abdominal pain for any changes. The following actions may help to alleviate any discomfort you are experiencing:  Only take over-the-counter or prescription medicines as directed by your health care provider.  Do not take laxatives unless directed to do so by your health care provider.  Try a clear liquid diet (broth, tea, or water) as directed by your health care provider. Slowly move to a bland diet as tolerated. SEEK MEDICAL CARE IF:  You have unexplained abdominal pain.  You have abdominal pain associated with nausea or diarrhea.  You have pain when you urinate or have a bowel movement.  You experience abdominal pain that wakes you in the night.  You have abdominal pain that is worsened or improved by eating food.  You have abdominal pain that is worsened with eating fatty foods.  You have a fever. SEEK IMMEDIATE MEDICAL CARE IF:   Your pain does not go away within 2 hours.  You keep throwing up (vomiting).  Your pain is felt only in portions of the abdomen, such as the right side or the left lower portion of the abdomen.  You pass bloody or black tarry stools. MAKE SURE YOU:  Understand these instructions.   Will  watch your condition.   Will get help right away if you are not doing well or get worse.  Document Released: 07/17/2005 Document Revised: 10/12/2013 Document Reviewed: 06/16/2013 [facility] Patient Information 2015 [place], [place]. This information is not intended to replace advice given to you by your health care provider. Make sure you discuss any questions you have with your health care provider.

## 2015-02-22 DIAGNOSIS — Z8719 Personal history of other diseases of the digestive system: Secondary | ICD-10-CM | POA: Diagnosis not present

## 2015-02-22 DIAGNOSIS — F329 Major depressive disorder, single episode, unspecified: Secondary | ICD-10-CM | POA: Diagnosis not present

## 2015-02-22 DIAGNOSIS — R569 Unspecified convulsions: Secondary | ICD-10-CM | POA: Diagnosis not present

## 2015-02-22 DIAGNOSIS — E780 Pure hypercholesterolemia: Secondary | ICD-10-CM | POA: Diagnosis not present

## 2015-02-22 DIAGNOSIS — M545 Low back pain: Secondary | ICD-10-CM | POA: Diagnosis not present

## 2015-02-22 DIAGNOSIS — M2550 Pain in unspecified joint: Secondary | ICD-10-CM | POA: Diagnosis not present

## 2015-02-22 DIAGNOSIS — F419 Anxiety disorder, unspecified: Secondary | ICD-10-CM | POA: Diagnosis not present

## 2015-02-22 DIAGNOSIS — I10 Essential (primary) hypertension: Secondary | ICD-10-CM | POA: Diagnosis not present

## 2015-03-08 DIAGNOSIS — M545 Low back pain: Secondary | ICD-10-CM | POA: Diagnosis not present

## 2015-03-08 DIAGNOSIS — Y999 Unspecified external cause status: Secondary | ICD-10-CM | POA: Diagnosis not present

## 2015-03-08 DIAGNOSIS — S39012A Strain of muscle, fascia and tendon of lower back, initial encounter: Secondary | ICD-10-CM | POA: Diagnosis not present

## 2015-04-17 ENCOUNTER — Emergency Department (HOSPITAL_COMMUNITY)
Admission: EM | Admit: 2015-04-17 | Discharge: 2015-04-17 | Disposition: A | Discharge status: Home / Self Care | Payer: BLUE CROSS/BLUE SHIELD | Attending: Emergency Medicine | Admitting: Emergency Medicine

## 2015-04-17 ENCOUNTER — Emergency Department (HOSPITAL_COMMUNITY): Payer: BLUE CROSS/BLUE SHIELD

## 2015-04-17 ENCOUNTER — Encounter (HOSPITAL_COMMUNITY): Payer: Self-pay | Admitting: Family Medicine

## 2015-04-17 DIAGNOSIS — M545 Low back pain: Secondary | ICD-10-CM | POA: Diagnosis not present

## 2015-04-17 DIAGNOSIS — R0789 Other chest pain: Secondary | ICD-10-CM | POA: Diagnosis not present

## 2015-04-17 DIAGNOSIS — G43909 Migraine, unspecified, not intractable, without status migrainosus: Secondary | ICD-10-CM | POA: Insufficient documentation

## 2015-04-17 DIAGNOSIS — Z880 Allergy status to penicillin: Secondary | ICD-10-CM | POA: Insufficient documentation

## 2015-04-17 DIAGNOSIS — I10 Essential (primary) hypertension: Secondary | ICD-10-CM | POA: Insufficient documentation

## 2015-04-17 DIAGNOSIS — R079 Chest pain, unspecified: Secondary | ICD-10-CM | POA: Diagnosis present

## 2015-04-17 DIAGNOSIS — K5190 Ulcerative colitis, unspecified, without complications: Secondary | ICD-10-CM | POA: Diagnosis not present

## 2015-04-17 DIAGNOSIS — F419 Anxiety disorder, unspecified: Secondary | ICD-10-CM | POA: Insufficient documentation

## 2015-04-17 DIAGNOSIS — R1030 Lower abdominal pain, unspecified: Secondary | ICD-10-CM | POA: Diagnosis not present

## 2015-04-17 DIAGNOSIS — G8929 Other chronic pain: Secondary | ICD-10-CM | POA: Insufficient documentation

## 2015-04-17 DIAGNOSIS — Z79899 Other long term (current) drug therapy: Secondary | ICD-10-CM | POA: Insufficient documentation

## 2015-04-17 DIAGNOSIS — Z8639 Personal history of other endocrine, nutritional and metabolic disease: Secondary | ICD-10-CM | POA: Diagnosis not present

## 2015-04-17 DIAGNOSIS — F329 Major depressive disorder, single episode, unspecified: Secondary | ICD-10-CM | POA: Insufficient documentation

## 2015-04-17 DIAGNOSIS — R197 Diarrhea, unspecified: Secondary | ICD-10-CM | POA: Insufficient documentation

## 2015-04-17 DIAGNOSIS — R112 Nausea with vomiting, unspecified: Secondary | ICD-10-CM | POA: Insufficient documentation

## 2015-04-17 DIAGNOSIS — Z8719 Personal history of other diseases of the digestive system: Secondary | ICD-10-CM | POA: Diagnosis not present

## 2015-04-17 DIAGNOSIS — J0100 Acute maxillary sinusitis, unspecified: Secondary | ICD-10-CM | POA: Diagnosis not present

## 2015-04-17 DIAGNOSIS — Z8739 Personal history of other diseases of the musculoskeletal system and connective tissue: Secondary | ICD-10-CM | POA: Diagnosis not present

## 2015-04-17 DIAGNOSIS — Z86018 Personal history of other benign neoplasm: Secondary | ICD-10-CM | POA: Insufficient documentation

## 2015-04-17 DIAGNOSIS — R05 Cough: Secondary | ICD-10-CM | POA: Diagnosis not present

## 2015-04-17 DIAGNOSIS — R110 Nausea: Secondary | ICD-10-CM | POA: Diagnosis not present

## 2015-04-17 DIAGNOSIS — G40909 Epilepsy, unspecified, not intractable, without status epilepticus: Secondary | ICD-10-CM | POA: Diagnosis not present

## 2015-04-17 DIAGNOSIS — F331 Major depressive disorder, recurrent, moderate: Secondary | ICD-10-CM | POA: Diagnosis not present

## 2015-04-17 LAB — CBC WITH DIFFERENTIAL/PLATELET
Basophils Absolute: 0 10*3/uL (ref 0.0–0.1)
Basophils Relative: 0 % (ref 0–1)
Eosinophils Absolute: 0 10*3/uL (ref 0.0–0.7)
Eosinophils Relative: 0 % (ref 0–5)
HCT: 40.3 % (ref 36.0–46.0)
Hemoglobin: 12.7 g/dL (ref 12.0–15.0)
Lymphocytes Relative: 49 % — ABNORMAL HIGH (ref 12–46)
Lymphs Abs: 2.7 10*3/uL (ref 0.7–4.0)
MCH: 25.1 pg — ABNORMAL LOW (ref 26.0–34.0)
MCHC: 31.5 g/dL (ref 30.0–36.0)
MCV: 79.6 fL (ref 78.0–100.0)
Monocytes Absolute: 0.6 10*3/uL (ref 0.1–1.0)
Monocytes Relative: 10 % (ref 3–12)
Neutro Abs: 2.3 10*3/uL (ref 1.7–7.7)
Neutrophils Relative %: 41 % — ABNORMAL LOW (ref 43–77)
Platelets: 337 10*3/uL (ref 150–400)
RBC: 5.06 MIL/uL (ref 3.87–5.11)
RDW: 15.7 % — ABNORMAL HIGH (ref 11.5–15.5)
WBC: 5.6 10*3/uL (ref 4.0–10.5)

## 2015-04-17 LAB — URINALYSIS, ROUTINE W REFLEX MICROSCOPIC
Glucose, UA: NEGATIVE mg/dL
Hgb urine dipstick: NEGATIVE
Ketones, ur: NEGATIVE mg/dL
Nitrite: NEGATIVE
Protein, ur: NEGATIVE mg/dL
Specific Gravity, Urine: 1.025 (ref 1.005–1.030)
Urobilinogen, UA: 0.2 mg/dL (ref 0.0–1.0)
pH: 6 (ref 5.0–8.0)

## 2015-04-17 LAB — URINE MICROSCOPIC-ADD ON

## 2015-04-17 LAB — BASIC METABOLIC PANEL
Anion gap: 10 (ref 5–15)
BUN: 12 mg/dL (ref 6–20)
CO2: 28 mmol/L (ref 22–32)
Calcium: 9 mg/dL (ref 8.9–10.3)
Chloride: 97 mmol/L — ABNORMAL LOW (ref 101–111)
Creatinine, Ser: 0.61 mg/dL (ref 0.44–1.00)
GFR calc Af Amer: >60 mL/min (ref >60)
GFR calc non Af Amer: >60 mL/min (ref >60)
Glucose, Bld: 103 mg/dL — ABNORMAL HIGH (ref 65–99)
Potassium: 3.1 mmol/L — ABNORMAL LOW (ref 3.5–5.1)
Sodium: 135 mmol/L (ref 135–145)

## 2015-04-17 LAB — TROPONIN I: Troponin I: <0.03 ng/mL (ref <0.031)

## 2015-04-17 MED ORDER — ONDANSETRON 4 MG PO TBDP
4.0000 mg | ORAL_TABLET | Freq: Once | ORAL | Status: AC
Start: 2015-04-17 — End: 2015-04-17
  Administered 2015-04-17: 4 mg via ORAL
  Filled 2015-04-17: qty 1

## 2015-04-17 MED ORDER — LORAZEPAM 1 MG PO TABS
1.0000 mg | ORAL_TABLET | Freq: Once | ORAL | Status: DC
  Filled 2015-04-17: qty 1

## 2015-04-17 MED ORDER — LORAZEPAM 2 MG/ML IJ SOLN
1.0000 mg | Freq: Once | INTRAMUSCULAR | Status: AC
  Administered 2015-04-17: 1 mg via INTRAMUSCULAR
  Filled 2015-04-17: qty 1

## 2015-04-17 NOTE — Discharge Instructions (Signed)

## 2015-04-17 NOTE — ED Notes (Addendum)
I notified patient she was being discharged.  Pt stated she is not feeling any better but was unable to state specific symptoms other than "I don't feel good".  Pt very tearful and vague with complaints.  Pt stated she is still nauseated.  No vomiting noted during time in ER.  Pt stated she wanted to talk to her doctor - MD made aware of this and is at bedside speaking with patient.

## 2015-04-17 NOTE — ED Provider Notes (Addendum)
CSN: [id number]     Arrival date & time 04/17/15  0440 History   First MD Initiated Contact with Patient 04/17/15 0701     Chief Complaint  Patient presents with  . Emesis  . Abdominal Pain  . Cough     (Consider location/radiation/quality/duration/timing/severity/associated sxs/prior Treatment) HPI   59 year old female with multiple complaints. Potentially being brought on or at least exacerbated by recent death of her aunt. Pt keeps returning to this and needs frequent redirection. Tightness in her chest. Constant. No appreciable exacerbating or relieving factors. Mild SOB. Reports fever to 102 on Saturday which has since resolved. Also some lower abdominal pain. She attributes this to "colitis." "I get sometimes when I'm upset like this." Nausea. Vomiting. Diarrhea. No blood in stool or emesis. No urinary complaints. No sick contacts.   Past Medical History  Diagnosis Date  . Hypertension   . Ulcerative colitis   . Seizure disorder   . HTN (hypertension), benign 01/03/2012  . Depression 01/03/2012  . 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 Hyperlipidemia 01/03/2012  . Fibroids 01/03/2012  . Chest pain, exertional   . SLE (systemic lupus erythematosus)   . GERD (gastroesophageal reflux disease)   . Hepatitis C     "signs of; not active" (05/28/2013)  . Migraines     "a few times/yr" (05/28/2013)  . Seizure     "today; < 1 yr ago, they just happen; started having them in my 20's" (05/28/2013)  . Chronic lower back pain   . Anxiety   . Chronic abdominal pain   . IBS (irritable bowel syndrome)   . Nausea and vomiting     chronic, recurrent  . Diverticulosis    Past Surgical History  Procedure Laterality Date  . Cholecystectomy  1978  . Abdominal hysterectomy  1978    "partial" (05/28/2013)  . Bilateral oophorectomy Bilateral 1987  . Colonoscopy N/A 05/31/2013    Procedure: COLONOSCOPY;  Surgeon: Wonda Horner, MD;  Location: [facility] ENDOSCOPY;  Service: Endoscopy;  Laterality: N/A;  .  Esophagogastroduodenoscopy (egd) with propofol N/A 05/11/2014    Procedure: ESOPHAGOGASTRODUODENOSCOPY (EGD) WITH PROPOFOL;  Surgeon: Lear Ng, MD;  Location: [facility] ENDOSCOPY;  Service: Endoscopy;  Laterality: N/A;  egd first  . Flexible sigmoidoscopy N/A 05/11/2014    Procedure: FLEXIBLE SIGMOIDOSCOPY;  Surgeon: Lear Ng, MD;  Location: [facility] ENDOSCOPY;  Service: Endoscopy;  Laterality: N/A;   Family History  Problem Relation Age of Onset  . Colon cancer Brother    History  Substance Use Topics  . Smoking status: Never Smoker   . Smokeless tobacco: Never Used  . Alcohol Use: Yes     Comment: 05/28/2013 "glass of wine rarely; weddings, etc"   OB History    No data available     Review of Systems  All systems reviewed and negative, other than as noted in HPI.   Allergies  Aspirin; Darvocet; Isometheptene-dichloral-apap; Penicillins; Tape; Wellbutrin; and Dilaudid  Home Medications   Prior to Admission medications   Medication Sig Start Date End Date Taking? Authorizing Provider  dicyclomine (BENTYL) 20 MG tablet Take 20 mg by mouth 4 (four) times daily -  before meals and at bedtime.   Yes Historical Provider, MD  FLUoxetine (PROZAC) 20 MG capsule Take 20 mg by mouth every morning.   Yes Historical Provider, MD  HYDROcodone-acetaminophen (NORCO/VICODIN) 5-325 MG per tablet 1 or 2 tabs PO q6 hours prn pain 08/17/14  Yes Francine Graven, DO  levETIRAcetam (KEPPRA XR) 500 MG 24  hr tablet Take 500 mg by mouth 2 (two) times daily.    Yes Historical Provider, MD  LORazepam (ATIVAN) 1 MG tablet Take 1 tablet (1 mg total) by mouth 3 (three) times daily as needed for anxiety. 11/18/14  Yes [name] Laisure, PA-C  mesalamine (ROWASA) 4 G enema Place 4 g rectally at bedtime.   Yes Historical Provider, MD  Multiple Vitamin (MULTIVITAMIN WITH MINERALS) TABS tablet Take 1 tablet by mouth daily.   Yes Historical Provider, MD  NON FORMULARY Take 1 capsule by mouth daily as needed  (relieve constipation).   Yes Historical Provider, MD  Propylene Glycol (SYSTANE BALANCE OP) Place 2 drops into both eyes 2 (two) times daily as needed (dry eyes).    Yes Historical Provider, MD  triamterene-hydrochlorothiazide (MAXZIDE-25) 37.5-25 MG per tablet Take 1 tablet by mouth daily. 06/16/14  Yes Historical Provider, MD  cyclobenzaprine (FLEXERIL) 5 MG tablet Take 1 tablet (5 mg total) by mouth 3 (three) times daily as needed for muscle spasms. Patient not taking: Reported on 04/17/2015 10/01/14   Billy Fischer, MD  naproxen (NAPROSYN) 500 MG tablet Take 1 tablet (500 mg total) by mouth 2 (two) times daily as needed for mild pain, moderate pain or headache (TAKE WITH MEALS.). Patient not taking: Reported on 11/17/2014 11/05/14   Mercedes Camprubi-Soms, PA-C  ondansetron (ZOFRAN) 4 MG tablet Take 1 tablet (4 mg total) by mouth every 6 (six) hours. Patient not taking: Reported on 04/17/2015 01/19/15   Ezequiel Essex, MD  oxyCODONE (ROXICODONE) 5 MG immediate release tablet Take 1 tablet (5 mg total) by mouth every 4 (four) hours as needed for severe pain. Patient not taking: Reported on 04/17/2015 11/18/14   Hyman Bible, PA-C  promethazine (PHENERGAN) 25 MG tablet Take 1 tablet (25 mg total) by mouth every 6 (six) hours as needed for nausea or vomiting. Patient not taking: Reported on 04/17/2015 11/05/14   Mercedes Camprubi-Soms, PA-C   BP 124/76 mmHg  Pulse 89  Temp(Src) 97.8 F (36.6 C) (Oral)  Resp 19  Ht 5\' 4"  (1.626 m)  Wt 220 lb (99.791 kg)  BMI 37.74 kg/m2  SpO2 100% Physical Exam  Constitutional: She appears well-developed and well-nourished. No distress.  HENT:  Head: Normocephalic and atraumatic.  Eyes: Conjunctivae are normal. Right eye exhibits no discharge. Left eye exhibits no discharge.  Neck: Neck supple.  Cardiovascular: Normal rate, regular rhythm and normal heart sounds.  Exam reveals no gallop and no friction rub.   No murmur heard. Pulmonary/Chest: Effort normal  and breath sounds normal. No respiratory distress.  Abdominal: Soft. She exhibits no distension. There is no tenderness.  Musculoskeletal: She exhibits no edema or tenderness.  Neurological: She is alert.  Skin: Skin is warm and dry.  Psychiatric: Her behavior is normal. Thought content normal.  Tearful at times  Nursing note and vitals reviewed.   ED Course  Procedures (including critical care time) Labs Review Labs Reviewed  URINALYSIS, ROUTINE W REFLEX MICROSCOPIC (NOT AT [facility]) - Abnormal; Notable for the following:    APPearance CLOUDY (*)    Bilirubin Urine SMALL (*)    Leukocytes, UA TRACE (*)    All other components within normal limits  URINE MICROSCOPIC-ADD ON - Abnormal; Notable for the following:    Squamous Epithelial / LPF FEW (*)    Bacteria, UA MANY (*)    All other components within normal limits  CBC WITH DIFFERENTIAL/PLATELET - Abnormal; Notable for the following:    MCH 25.1 (*)  RDW 15.7 (*)    Neutrophils Relative % 41 (*)    Lymphocytes Relative 49 (*)    All other components within normal limits  BASIC METABOLIC PANEL  TROPONIN I    Imaging Review No results found.   EKG Interpretation   Date/Time:  Monday April 17 2015 05:32:29 EDT Ventricular Rate:  75 PR Interval:  188 QRS Duration: 85 QT Interval:  404 QTC Calculation: 451 R Axis:   44 Text Interpretation:  Sinus rhythm No significant change since last  tracing Confirmed by Domonick Sittner  MD, [place] ([id number]) on 04/17/2015 8:49:15 AM      MDM   Final diagnoses:  Chest pain  Nausea    58yF with multiple complaints. Suspect some component of anxiety/grieving. Chest pain is atypical. EKG is not acutely changed. W/u fairly unremarkable. Doubt PE, infectious, dissection, etc. Abdominal pain with a benign exam. Past history lists ulcerative colitis, IBS and chronic recurrent nausea and vomiting. No emesis in ED. Denies blood in stool. Many bacteria on UA, but some squamous cells and no specific  urinary complaints. Culture sent. Abx deferred.  It has been determined that no acute conditions requiring further emergency intervention are present at this time. The patient has been advised of the diagnosis and plan. I reviewed any labs and imaging including any potential incidental findings. We have discussed signs and symptoms that warrant return to the ED and they are listed in the discharge instructions.    Virgel Manifold, MD 04/17/15 [phone]  Pt upset about discharge and feels like her concerns are not being addressed. Cannot specifically say what though. Remains tearful. Reassurance provided. PCP FU for continued symptoms.   Virgel Manifold, MD 04/17/15 [phone]

## 2015-04-17 NOTE — ED Notes (Signed)
Patient reports she is experiencing lower abd pain with nausea, vomiting, and diarrhea. Cold like symptoms started about one week ago. Fever present 102.0 oral on Saturday.

## 2015-04-17 NOTE — ED Notes (Addendum)
Pt took Zofran but refuses to take Ativan.  Pt states "I will lay here and keep suffering and then I will go suffer at home".  Pt states "they gave me something IV when I had my colitis but I cannot take anything in my mouth because it's going to make my stomach cramp and I can't handle that".  Pt advised that zofran should relieve nausea but pt refuses to attempt to take ativan.  Pt now states "I have colitis and I cannot take anything by mouth and the chest pain is not my main problem".  Pt dry heaved once during attempt at giving ativan. MD made aware.

## 2015-04-17 NOTE — ED Notes (Signed)
MD at bedside. 

## 2015-04-18 LAB — URINE CULTURE

## 2015-08-08 DIAGNOSIS — R569 Unspecified convulsions: Secondary | ICD-10-CM | POA: Diagnosis not present

## 2015-09-05 ENCOUNTER — Other Ambulatory Visit: Payer: Self-pay | Admitting: Obstetrics and Gynecology

## 2015-09-05 DIAGNOSIS — N644 Mastodynia: Secondary | ICD-10-CM | POA: Diagnosis not present

## 2015-09-08 ENCOUNTER — Other Ambulatory Visit: Payer: Self-pay | Admitting: Obstetrics and Gynecology

## 2015-09-08 ENCOUNTER — Ambulatory Visit
Admission: RE | Admit: 2015-09-08 | Discharge: 2015-09-08 | Disposition: A | Discharge status: Home / Self Care | Payer: Medicare Other | Source: Ambulatory Visit | Attending: Obstetrics and Gynecology | Admitting: Obstetrics and Gynecology

## 2015-09-08 DIAGNOSIS — N644 Mastodynia: Secondary | ICD-10-CM

## 2015-09-08 DIAGNOSIS — R928 Other abnormal and inconclusive findings on diagnostic imaging of breast: Secondary | ICD-10-CM | POA: Diagnosis not present

## 2015-09-08 DIAGNOSIS — N6489 Other specified disorders of breast: Secondary | ICD-10-CM | POA: Diagnosis not present

## 2015-10-20 DIAGNOSIS — I10 Essential (primary) hypertension: Secondary | ICD-10-CM | POA: Diagnosis not present

## 2015-10-20 DIAGNOSIS — M150 Primary generalized (osteo)arthritis: Secondary | ICD-10-CM | POA: Diagnosis not present

## 2015-10-20 DIAGNOSIS — F331 Major depressive disorder, recurrent, moderate: Secondary | ICD-10-CM | POA: Diagnosis not present

## 2015-10-20 DIAGNOSIS — J209 Acute bronchitis, unspecified: Secondary | ICD-10-CM | POA: Diagnosis not present

## 2015-10-20 DIAGNOSIS — F419 Anxiety disorder, unspecified: Secondary | ICD-10-CM | POA: Diagnosis not present

## 2015-11-20 ENCOUNTER — Emergency Department (HOSPITAL_COMMUNITY)
Admission: EM | Admit: 2015-11-20 | Discharge: 2015-11-20 | Disposition: A | Discharge status: Home / Self Care | Payer: Medicare Other | Attending: Emergency Medicine | Admitting: Emergency Medicine

## 2015-11-20 ENCOUNTER — Encounter (HOSPITAL_COMMUNITY): Payer: Self-pay | Admitting: Emergency Medicine

## 2015-11-20 DIAGNOSIS — G8929 Other chronic pain: Secondary | ICD-10-CM | POA: Diagnosis not present

## 2015-11-20 DIAGNOSIS — F419 Anxiety disorder, unspecified: Secondary | ICD-10-CM | POA: Diagnosis not present

## 2015-11-20 DIAGNOSIS — R079 Chest pain, unspecified: Secondary | ICD-10-CM | POA: Insufficient documentation

## 2015-11-20 DIAGNOSIS — I10 Essential (primary) hypertension: Secondary | ICD-10-CM | POA: Diagnosis not present

## 2015-11-20 DIAGNOSIS — R0602 Shortness of breath: Secondary | ICD-10-CM | POA: Diagnosis not present

## 2015-11-20 DIAGNOSIS — R569 Unspecified convulsions: Secondary | ICD-10-CM | POA: Diagnosis not present

## 2015-11-20 NOTE — ED Provider Notes (Signed)
60yo F w/ PMH including seizure disorder on Keppra, anxiety who presents with  anxiety, chest pain, and possible seizure activity at home. Patient arrived by EMS after husband called them for concerns for anxiety. Fire department reported seizure-like activity. The patient was given 5 mg Haldol in route. On arrival to ED, the patient immediately requested to leave. I was called to the patient's bedside. She stated that she thinks she just had a panic attack because of a lot of anxiety related to her brother's death and the fact that her sister is in the hospital dying. She admits that she has not been taking her Keppra as prescribed. She does endorse chest pain, however she is not interested in the examination or evaluation. She denies any severe depression or SI. During conversation, she demonstrates appropriate decision making capacity. I have discussed with her the risks of leaving without evaluation including but not limited to worsening condition, permanent disability, or even death. The patient has voiced understanding but continues to request to leave. Patient signed out AMA.  Sharlett Iles, MD 11/20/15 [phone]

## 2015-11-20 NOTE — ED Notes (Signed)
Patient presents from home via EMS for anxiety. Found supine on ground, non responsive, able to move independently. History of anxiety and seizures. Fire dept reported grand mal seizure. 5mg  Haldol in route.   22 R hand. Last VS: 104hr, 132/75.

## 2015-11-20 NOTE — ED Notes (Signed)
Bed: WA06 Expected date:  Expected time:  Means of arrival:  Comments: EMS/71F/anxiety

## 2015-11-20 NOTE — ED Notes (Signed)
Patient reports she would like to leave, she feels better and does not want to stay. A&O x4. Ambulatory to restroom without assistance, steady gait. EDP Little at bedside. Patient to sign out AMA.

## 2015-12-12 ENCOUNTER — Emergency Department (HOSPITAL_COMMUNITY)
Admission: EM | Admit: 2015-12-12 | Discharge: 2015-12-12 | Disposition: A | Discharge status: Home / Self Care | Payer: Medicare Other | Attending: Emergency Medicine | Admitting: Emergency Medicine

## 2015-12-12 ENCOUNTER — Encounter (HOSPITAL_COMMUNITY): Payer: Self-pay | Admitting: Emergency Medicine

## 2015-12-12 DIAGNOSIS — Y9389 Activity, other specified: Secondary | ICD-10-CM | POA: Insufficient documentation

## 2015-12-12 DIAGNOSIS — M79605 Pain in left leg: Secondary | ICD-10-CM

## 2015-12-12 DIAGNOSIS — G8929 Other chronic pain: Secondary | ICD-10-CM | POA: Insufficient documentation

## 2015-12-12 DIAGNOSIS — S8992XA Unspecified injury of left lower leg, initial encounter: Secondary | ICD-10-CM | POA: Insufficient documentation

## 2015-12-12 DIAGNOSIS — Y9289 Other specified places as the place of occurrence of the external cause: Secondary | ICD-10-CM | POA: Insufficient documentation

## 2015-12-12 DIAGNOSIS — Z8639 Personal history of other endocrine, nutritional and metabolic disease: Secondary | ICD-10-CM | POA: Insufficient documentation

## 2015-12-12 DIAGNOSIS — F419 Anxiety disorder, unspecified: Secondary | ICD-10-CM | POA: Insufficient documentation

## 2015-12-12 DIAGNOSIS — Z8739 Personal history of other diseases of the musculoskeletal system and connective tissue: Secondary | ICD-10-CM | POA: Insufficient documentation

## 2015-12-12 DIAGNOSIS — K589 Irritable bowel syndrome without diarrhea: Secondary | ICD-10-CM | POA: Diagnosis not present

## 2015-12-12 DIAGNOSIS — W19XXXA Unspecified fall, initial encounter: Secondary | ICD-10-CM

## 2015-12-12 DIAGNOSIS — W07XXXA Fall from chair, initial encounter: Secondary | ICD-10-CM | POA: Diagnosis not present

## 2015-12-12 DIAGNOSIS — F329 Major depressive disorder, single episode, unspecified: Secondary | ICD-10-CM | POA: Insufficient documentation

## 2015-12-12 DIAGNOSIS — Y998 Other external cause status: Secondary | ICD-10-CM | POA: Diagnosis not present

## 2015-12-12 DIAGNOSIS — I10 Essential (primary) hypertension: Secondary | ICD-10-CM | POA: Insufficient documentation

## 2015-12-12 DIAGNOSIS — Z79899 Other long term (current) drug therapy: Secondary | ICD-10-CM | POA: Diagnosis not present

## 2015-12-12 DIAGNOSIS — Z86010 Personal history of colonic polyps: Secondary | ICD-10-CM | POA: Diagnosis not present

## 2015-12-12 DIAGNOSIS — Z8619 Personal history of other infectious and parasitic diseases: Secondary | ICD-10-CM | POA: Diagnosis not present

## 2015-12-12 DIAGNOSIS — Z880 Allergy status to penicillin: Secondary | ICD-10-CM | POA: Insufficient documentation

## 2015-12-12 MED ORDER — OXYCODONE-ACETAMINOPHEN 5-325 MG PO TABS
2.0000 | ORAL_TABLET | Freq: Once | ORAL | Status: AC
  Administered 2015-12-12: 2 via ORAL
  Filled 2015-12-12: qty 2

## 2015-12-12 NOTE — ED Notes (Signed)
Per pt states she fell out of chair and landed on left side

## 2015-12-12 NOTE — ED Provider Notes (Signed)
CSN: [id number]     Arrival date & time 12/12/15  1813 History  By signing my name below, I, Irene Pap, attest that this documentation has been prepared under the direction and in the presence of Charlann Lange, PA-C.  Electronically Signed: Irene Pap, ED Scribe. 12/12/2015. 9:30 PM.   Chief Complaint  Patient presents with  . Fall   The history is provided by the patient. No language interpreter was used.  HPI Comments: Erin Wolf is a 60 y.o. female with a hx of HTN, SLE, arthritis, and seizures who presents to the Emergency Department complaining of a fall onset earlier today. Pt states that she fell out of a chair and landed on her left side. She is complaining of left leg pain from her knee to her foot and feels like her left foot is swollen. Pt is able to ambulate but it is painful. Pt has not taken anything for her pain PTA; she states that she came straight to the ED after her fall. She denies hitting head, abdominal pain, back pain, neck pain, numbness, weakness, or LOC. Pt receives pain medication from her PCP once a month, but is not at a pain clinic. Pt reports allergies to aspirin, Dilaudid, Darvocet, and penicillins.  Past Medical History  Diagnosis Date  . Hypertension   . Ulcerative colitis   . Seizure disorder ([place])   . HTN (hypertension), benign 01/03/2012  . Depression 01/03/2012  . 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 Hyperlipidemia 01/03/2012  . Fibroids 01/03/2012  . Chest pain, exertional   . SLE (systemic lupus erythematosus) ([place])   . GERD (gastroesophageal reflux disease)   . Hepatitis C     "signs of; not active" (05/28/2013)  . Migraines     "a few times/yr" (05/28/2013)  . Seizure ([place])     "today; < 1 yr ago, they just happen; started having them in my 20's" (05/28/2013)  . Chronic lower back pain   . Anxiety   . Chronic abdominal pain   . IBS (irritable bowel syndrome)   . Nausea and vomiting     chronic, recurrent  . Diverticulosis    Past  Surgical History  Procedure Laterality Date  . Cholecystectomy  1978  . Abdominal hysterectomy  1978    "partial" (05/28/2013)  . Bilateral oophorectomy Bilateral 1987  . Colonoscopy N/A 05/31/2013    Procedure: COLONOSCOPY;  Surgeon: Wonda Horner, MD;  Location: [facility] ENDOSCOPY;  Service: Endoscopy;  Laterality: N/A;  . Esophagogastroduodenoscopy (egd) with propofol N/A 05/11/2014    Procedure: ESOPHAGOGASTRODUODENOSCOPY (EGD) WITH PROPOFOL;  Surgeon: Lear Ng, MD;  Location: [facility] ENDOSCOPY;  Service: Endoscopy;  Laterality: N/A;  egd first  . Flexible sigmoidoscopy N/A 05/11/2014    Procedure: FLEXIBLE SIGMOIDOSCOPY;  Surgeon: Lear Ng, MD;  Location: [facility] ENDOSCOPY;  Service: Endoscopy;  Laterality: N/A;   Family History  Problem Relation Age of Onset  . Colon cancer Brother    Social History  Substance Use Topics  . Smoking status: Never Smoker   . Smokeless tobacco: Never Used  . Alcohol Use: Yes     Comment: 05/28/2013 "glass of wine rarely; weddings, etc"   OB History    No data available     Review of Systems  Musculoskeletal: Positive for arthralgias. Negative for back pain and neck pain.  Neurological: Negative for syncope, weakness and numbness.   Allergies  Aspirin; Darvocet; Isometheptene-dichloral-apap; Penicillins; Tape; Wellbutrin; and Dilaudid  Home Medications   Prior to Admission medications  Medication Sig Start Date End Date Taking? Authorizing Provider  cyclobenzaprine (FLEXERIL) 5 MG tablet Take 1 tablet (5 mg total) by mouth 3 (three) times daily as needed for muscle spasms. Patient not taking: Reported on 04/17/2015 10/01/14   Billy Fischer, MD  dicyclomine (BENTYL) 20 MG tablet Take 20 mg by mouth 4 (four) times daily -  before meals and at bedtime.    Historical Provider, MD  FLUoxetine (PROZAC) 20 MG capsule Take 20 mg by mouth every morning.    Historical Provider, MD  HYDROcodone-acetaminophen (NORCO/VICODIN) 5-325 MG per tablet 1 or 2  tabs PO q6 hours prn pain Patient not taking: Reported on 11/20/2015 08/17/14   Francine Graven, DO  levETIRAcetam (KEPPRA XR) 500 MG 24 hr tablet Take 500 mg by mouth 2 (two) times daily.     Historical Provider, MD  LORazepam (ATIVAN) 1 MG tablet Take 1 tablet (1 mg total) by mouth 3 (three) times daily as needed for anxiety. 11/18/14   Hyman Bible, PA-C  mesalamine (ROWASA) 4 G enema Place 4 g rectally at bedtime as needed (colitis flare up.).     Historical Provider, MD  Multiple Vitamin (MULTIVITAMIN WITH MINERALS) TABS tablet Take 1 tablet by mouth daily.    Historical Provider, MD  naproxen (NAPROSYN) 500 MG tablet Take 1 tablet (500 mg total) by mouth 2 (two) times daily as needed for mild pain, moderate pain or headache (TAKE WITH MEALS.). Patient not taking: Reported on 11/17/2014 11/05/14   Mercedes Camprubi-Soms, PA-C  NON FORMULARY Take 1 capsule by mouth daily as needed (relieve constipation).    Historical Provider, MD  ondansetron (ZOFRAN) 4 MG tablet Take 1 tablet (4 mg total) by mouth every 6 (six) hours. Patient not taking: Reported on 04/17/2015 01/19/15   Ezequiel Essex, MD  oxyCODONE (ROXICODONE) 5 MG immediate release tablet Take 1 tablet (5 mg total) by mouth every 4 (four) hours as needed for severe pain. Patient not taking: Reported on 04/17/2015 11/18/14   Hyman Bible, PA-C  Propylene Glycol (SYSTANE BALANCE OP) Place 2 drops into both eyes 2 (two) times daily as needed (dry eyes).     Historical Provider, MD  triamterene-hydrochlorothiazide (MAXZIDE-25) 37.5-25 MG per tablet Take 1 tablet by mouth daily. 06/16/14   Historical Provider, MD   BP 121/87 mmHg  Pulse 85  Temp(Src) 98 F (36.7 C) (Oral)  Resp 17  SpO2 100% Physical Exam  Constitutional: She is oriented to person, place, and time. She appears well-developed and well-nourished.  HENT:  Head: Normocephalic and atraumatic.  Eyes: EOM are normal.  Neck: Normal range of motion. Neck supple.   Cardiovascular: Normal rate.   Pulmonary/Chest: Effort normal.  Abdominal: Soft. There is no tenderness.  Musculoskeletal: Normal range of motion.  Exam limited by body habitus; generalized tenderness of LLE; no bony deformities; full ROM; pt is ambulatory and weightbearing; ankle has moderate lateral swelling without discoloration; all joints of LLE are stable; there is no midline or paralumbar tenderness  Neurological: She is alert and oriented to person, place, and time.  Skin: Skin is warm and dry.  Psychiatric: She has a normal mood and affect. Her behavior is normal.  Nursing note and vitals reviewed.   ED Course  Procedures (including critical care time) DIAGNOSTIC STUDIES: Oxygen Saturation is 100% on RA, normal by my interpretation.    COORDINATION OF CARE: 9:24 PM-Discussed treatment plan which includes oral pain medication with pt at bedside and pt agreed to plan. Pt initially declined  a prescription for pain medication because she states that she has some at home. She was offered many alternatives to relieve pain in the ED, but persisted on receiving an IM injection of pain medication. After explaining that oral medication would work in the same amount of time, pt agreed to take the oral medication prior to discharge.   Labs Review Labs Reviewed - No data to display  Imaging Review No results found. I have personally reviewed and evaluated these images and lab results as part of my medical decision-making.   EKG Interpretation None      MDM   Final diagnoses:  None    1. Fall 2. Diffuse left leg pain  Doubt fracture as patient is ambulatory, no bony deformity, generally tender. She reports she has pain medication at home but couldn't wait til she returned home to take it, so came here for a shot "to speed things up". Discussed pain management would be provided here by mouth, she can take her medication as prescribed at home. Stable for discharge.    I  personally performed the services described in this documentation, which was scribed in my presence. The recorded information has been reviewed and is accurate.     Charlann Lange, PA-C 12/12/15 [id number]  Quintella Reichert, MD 12/15/15 [phone]

## 2015-12-12 NOTE — Discharge Instructions (Signed)
Cryotherapy °Cryotherapy means treatment with cold. Ice or gel packs can be used to reduce both pain and swelling. Ice is the most helpful within the first 24 to 48 hours after an injury or flare-up from overusing a muscle or joint. Sprains, strains, spasms, burning pain, shooting pain, and aches can all be eased with ice. Ice can also be used when recovering from surgery. Ice is effective, has very few side effects, and is safe for most people to use. °PRECAUTIONS  °Ice is not a safe treatment option for people with: °· Raynaud phenomenon. This is a condition affecting small blood vessels in the extremities. Exposure to cold may cause your problems to return. °· Cold hypersensitivity. There are many forms of cold hypersensitivity, including: °¨ Cold urticaria. Red, itchy hives appear on the skin when the tissues begin to warm after being iced. °¨ Cold erythema. This is a red, itchy rash caused by exposure to cold. °¨ Cold hemoglobinuria. Red blood cells break down when the tissues begin to warm after being iced. The hemoglobin that carry oxygen are passed into the urine because they cannot combine with blood proteins fast enough. °· Numbness or altered sensitivity in the area being iced. °If you have any of the following conditions, do not use ice until you have discussed cryotherapy with your caregiver: °· Heart conditions, such as arrhythmia, angina, or chronic heart disease. °· High blood pressure. °· Healing wounds or open skin in the area being iced. °· Current infections. °· Rheumatoid arthritis. °· Poor circulation. °· Diabetes. °Ice slows the blood flow in the region it is applied. This is beneficial when trying to stop inflamed tissues from spreading irritating chemicals to surrounding tissues. However, if you expose your skin to cold temperatures for too long or without the proper protection, you can damage your skin or nerves. Watch for signs of skin damage due to cold. °HOME CARE INSTRUCTIONS °Follow  these tips to use ice and cold packs safely. °· Place a dry or damp towel between the ice and skin. A damp towel will cool the skin more quickly, so you may need to shorten the time that the ice is used. °· For a more rapid response, add gentle compression to the ice. °· Ice for no more than 10 to 20 minutes at a time. The bonier the area you are icing, the less time it will take to get the benefits of ice. °· Check your skin after 5 minutes to make sure there are no signs of a poor response to cold or skin damage. °· Rest 20 minutes or more between uses. °· Once your skin is numb, you can end your treatment. You can test numbness by very lightly touching your skin. The touch should be so light that you do not see the skin dimple from the pressure of your fingertip. When using ice, most people will feel these normal sensations in this order: cold, burning, aching, and numbness. °· Do not use ice on someone who cannot communicate their responses to pain, such as small children or people with dementia. °HOW TO MAKE AN ICE PACK °Ice packs are the most common way to use ice therapy. Other methods include ice massage, ice baths, and cryosprays. Muscle creams that cause a cold, tingly feeling do not offer the same benefits that ice offers and should not be used as a substitute unless recommended by your caregiver. °To make an ice pack, do one of the following: °· Place crushed ice or a   bag of frozen vegetables in a sealable plastic bag. Squeeze out the excess air. Place this bag inside another plastic bag. Slide the bag into a pillowcase or place a damp towel between your skin and the bag. °· Mix 3 parts water with 1 part rubbing alcohol. Freeze the mixture in a sealable plastic bag. When you remove the mixture from the freezer, it will be slushy. Squeeze out the excess air. Place this bag inside another plastic bag. Slide the bag into a pillowcase or place a damp towel between your skin and the bag. °SEEK MEDICAL CARE  IF: °· You develop white spots on your skin. This may give the skin a blotchy (mottled) appearance. °· Your skin turns blue or pale. °· Your skin becomes waxy or hard. °· Your swelling gets worse. °MAKE SURE YOU:  °· Understand these instructions. °· Will watch your condition. °· Will get help right away if you are not doing well or get worse. °  °This information is not intended to replace advice given to you by your health care provider. Make sure you discuss any questions you have with your health care provider. °  °Document Released: 06/03/2011 Document Revised: 10/28/2014 Document Reviewed: 06/03/2011 °Elsevier Interactive Patient Education ©2016 [employer] ° °Musculoskeletal Pain °Musculoskeletal pain is muscle and boney aches and pains. These pains can occur in any part of the body. Your caregiver may treat you without knowing the cause of the pain. They may treat you if blood or urine tests, X-rays, and other tests were normal.  °CAUSES °There is often not a definite cause or reason for these pains. These pains may be caused by a type of germ (virus). The discomfort may also come from overuse. Overuse includes working out too hard when your body is not fit. Boney aches also come from weather changes. Bone is sensitive to atmospheric pressure changes. °HOME CARE INSTRUCTIONS  °· Ask when your test results will be ready. Make sure you get your test results. °· Only take over-the-counter or prescription medicines for pain, discomfort, or fever as directed by your caregiver. If you were given medications for your condition, do not drive, operate machinery or power tools, or sign legal documents for 24 hours. Do not drink alcohol. Do not take sleeping pills or other medications that may interfere with treatment. °· Continue all activities unless the activities cause more pain. When the pain lessens, slowly resume normal activities. Gradually increase the intensity and duration of the activities or  exercise. °· During periods of severe pain, bed rest may be helpful. Lay or sit in any position that is comfortable. °· Putting ice on the injured area. °¨ Put ice in a bag. °¨ Place a towel between your skin and the bag. °¨ Leave the ice on for 15 to 20 minutes, 3 to 4 times a day. °· Follow up with your caregiver for continued problems and no reason can be found for the pain. If the pain becomes worse or does not go away, it may be necessary to repeat tests or do additional testing. Your caregiver may need to look further for a possible cause. °SEEK IMMEDIATE MEDICAL CARE IF: °· You have pain that is getting worse and is not relieved by medications. °· You develop chest pain that is associated with shortness or breath, sweating, feeling sick to your stomach (nauseous), or throw up (vomit). °· Your pain becomes localized to the abdomen. °· You develop any new symptoms that seem different or that concern   you. °MAKE SURE YOU:  °· Understand these instructions. °· Will watch your condition. °· Will get help right away if you are not doing well or get worse. °  °This information is not intended to replace advice given to you by your health care provider. Make sure you discuss any questions you have with your health care provider. °  °Document Released: 10/07/2005 Document Revised: 12/30/2011 Document Reviewed: 06/11/2013 °Elsevier Interactive Patient Education ©2016 [employer] ° °

## 2015-12-14 DIAGNOSIS — K5190 Ulcerative colitis, unspecified, without complications: Secondary | ICD-10-CM | POA: Diagnosis not present

## 2015-12-14 DIAGNOSIS — S7012XD Contusion of left thigh, subsequent encounter: Secondary | ICD-10-CM | POA: Diagnosis not present

## 2015-12-14 DIAGNOSIS — S93409A Sprain of unspecified ligament of unspecified ankle, initial encounter: Secondary | ICD-10-CM | POA: Diagnosis not present

## 2016-02-06 DIAGNOSIS — F4320 Adjustment disorder, unspecified: Secondary | ICD-10-CM | POA: Diagnosis not present

## 2016-02-06 DIAGNOSIS — F419 Anxiety disorder, unspecified: Secondary | ICD-10-CM | POA: Diagnosis not present

## 2016-02-06 DIAGNOSIS — H538 Other visual disturbances: Secondary | ICD-10-CM | POA: Diagnosis not present

## 2016-02-06 DIAGNOSIS — G44209 Tension-type headache, unspecified, not intractable: Secondary | ICD-10-CM | POA: Diagnosis not present

## 2016-02-06 DIAGNOSIS — H269 Unspecified cataract: Secondary | ICD-10-CM | POA: Diagnosis not present

## 2016-02-06 DIAGNOSIS — H6123 Impacted cerumen, bilateral: Secondary | ICD-10-CM | POA: Diagnosis not present

## 2016-03-19 ENCOUNTER — Emergency Department (HOSPITAL_COMMUNITY)
Admission: EM | Admit: 2016-03-19 | Discharge: 2016-03-19 | Disposition: A | Discharge status: Home / Self Care | Payer: Medicare Other | Attending: Emergency Medicine | Admitting: Emergency Medicine

## 2016-03-19 ENCOUNTER — Emergency Department (HOSPITAL_COMMUNITY): Payer: Medicare Other

## 2016-03-19 ENCOUNTER — Encounter (HOSPITAL_COMMUNITY): Payer: Self-pay | Admitting: *Deleted

## 2016-03-19 DIAGNOSIS — S060X0A Concussion without loss of consciousness, initial encounter: Secondary | ICD-10-CM | POA: Diagnosis not present

## 2016-03-19 DIAGNOSIS — R5383 Other fatigue: Secondary | ICD-10-CM | POA: Insufficient documentation

## 2016-03-19 DIAGNOSIS — S0990XA Unspecified injury of head, initial encounter: Secondary | ICD-10-CM | POA: Insufficient documentation

## 2016-03-19 DIAGNOSIS — Z8719 Personal history of other diseases of the digestive system: Secondary | ICD-10-CM | POA: Insufficient documentation

## 2016-03-19 DIAGNOSIS — R410 Disorientation, unspecified: Secondary | ICD-10-CM | POA: Diagnosis not present

## 2016-03-19 DIAGNOSIS — I10 Essential (primary) hypertension: Secondary | ICD-10-CM | POA: Diagnosis not present

## 2016-03-19 DIAGNOSIS — W01198A Fall on same level from slipping, tripping and stumbling with subsequent striking against other object, initial encounter: Secondary | ICD-10-CM | POA: Insufficient documentation

## 2016-03-19 DIAGNOSIS — Y9389 Activity, other specified: Secondary | ICD-10-CM | POA: Diagnosis not present

## 2016-03-19 DIAGNOSIS — Z86018 Personal history of other benign neoplasm: Secondary | ICD-10-CM | POA: Insufficient documentation

## 2016-03-19 DIAGNOSIS — G40909 Epilepsy, unspecified, not intractable, without status epilepticus: Secondary | ICD-10-CM | POA: Diagnosis not present

## 2016-03-19 DIAGNOSIS — Y9289 Other specified places as the place of occurrence of the external cause: Secondary | ICD-10-CM | POA: Diagnosis not present

## 2016-03-19 DIAGNOSIS — R569 Unspecified convulsions: Secondary | ICD-10-CM | POA: Insufficient documentation

## 2016-03-19 DIAGNOSIS — Y998 Other external cause status: Secondary | ICD-10-CM | POA: Insufficient documentation

## 2016-03-19 DIAGNOSIS — S199XXA Unspecified injury of neck, initial encounter: Secondary | ICD-10-CM | POA: Insufficient documentation

## 2016-03-19 DIAGNOSIS — F419 Anxiety disorder, unspecified: Secondary | ICD-10-CM | POA: Insufficient documentation

## 2016-03-19 DIAGNOSIS — G8929 Other chronic pain: Secondary | ICD-10-CM | POA: Diagnosis not present

## 2016-03-19 DIAGNOSIS — Z880 Allergy status to penicillin: Secondary | ICD-10-CM | POA: Diagnosis not present

## 2016-03-19 DIAGNOSIS — Z79899 Other long term (current) drug therapy: Secondary | ICD-10-CM | POA: Diagnosis not present

## 2016-03-19 DIAGNOSIS — R4182 Altered mental status, unspecified: Secondary | ICD-10-CM | POA: Diagnosis present

## 2016-03-19 LAB — CBC
HEMATOCRIT: 42.6 % (ref 36.0–46.0)
Hemoglobin: 13.6 g/dL (ref 12.0–15.0)
MCH: 25.8 pg — ABNORMAL LOW (ref 26.0–34.0)
MCHC: 31.9 g/dL (ref 30.0–36.0)
MCV: 80.7 fL (ref 78.0–100.0)
Platelets: 378 10*3/uL (ref 150–400)
RBC: 5.28 MIL/uL — ABNORMAL HIGH (ref 3.87–5.11)
RDW: 14.9 % (ref 11.5–15.5)
WBC: 5.4 10*3/uL (ref 4.0–10.5)

## 2016-03-19 LAB — BASIC METABOLIC PANEL
Anion gap: 11 (ref 5–15)
BUN: 11 mg/dL (ref 6–20)
CHLORIDE: 100 mmol/L — AB (ref 101–111)
CO2: 25 mmol/L (ref 22–32)
Calcium: 9.8 mg/dL (ref 8.9–10.3)
Creatinine, Ser: 0.8 mg/dL (ref 0.44–1.00)
GFR calc Af Amer: >60 mL/min (ref >60)
GFR calc non Af Amer: >60 mL/min (ref >60)
GLUCOSE: 111 mg/dL — AB (ref 65–99)
Potassium: 3.6 mmol/L (ref 3.5–5.1)
Sodium: 136 mmol/L (ref 135–145)

## 2016-03-19 MED ORDER — SODIUM CHLORIDE 0.9 % IV BOLUS (SEPSIS)
500.0000 mL | Freq: Once | INTRAVENOUS | Status: AC
  Administered 2016-03-19: 500 mL via INTRAVENOUS

## 2016-03-19 MED ORDER — METOCLOPRAMIDE HCL 5 MG/ML IJ SOLN
10.0000 mg | Freq: Once | INTRAMUSCULAR | Status: AC
  Administered 2016-03-19: 10 mg via INTRAVENOUS
  Filled 2016-03-19: qty 2

## 2016-03-19 MED ORDER — DIPHENHYDRAMINE HCL 50 MG/ML IJ SOLN
25.0000 mg | Freq: Once | INTRAMUSCULAR | Status: AC
  Administered 2016-03-19: 25 mg via INTRAVENOUS
  Filled 2016-03-19: qty 1

## 2016-03-19 NOTE — ED Provider Notes (Signed)
CSN: [id number]     Arrival date & time 03/19/16  1437 History   None    Chief Complaint  Patient presents with  . Fall  . Altered Mental Status    Patient is a 60 y.o. female presenting with headaches. The history is provided by the patient.  Headache Pain location:  Generalized Quality: hammer. Severity at highest:  10/10 Onset quality:  Gradual Duration:  3 days Progression:  Worsening Chronicity:  Recurrent Similar to prior headaches: yes (but worse)   Context comment:  Fall, hit head with sz Associated symptoms: fatigue, myalgias, nausea, neck pain, photophobia and seizures   Associated symptoms: no abdominal pain, no back pain, no blurred vision, no cough, no dizziness, no fever, no near-syncope, no neck stiffness, no tingling, no visual change, no vomiting and no weakness   Risk factors comment:  Hx of szs and migraines, doesnt get migraines frequently anymore   Erin Wolf is a 60 y.o. F with hx of szs, HLD, HTN, UC, SLE, mental health problems, chronic pain who presents to ED s/p fall 3 days ago.  Woke up on ground,, had urinated on self.  No aura, no warning sign.  No preceding nausea diaphoresis, palpitations, weakness.  Compliant with keppra.   Main presenting complaint is decreased concentration and headache.  No chest pain.  No SOB.    Past Medical History  Diagnosis Date  . Hypertension   . Ulcerative colitis   . Seizure disorder ([place])   . HTN (hypertension), benign 01/03/2012  . Depression 01/03/2012  . 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 Hyperlipidemia 01/03/2012  . Fibroids 01/03/2012  . Chest pain, exertional   . SLE (systemic lupus erythematosus) ([place])   . GERD (gastroesophageal reflux disease)   . Hepatitis C     "signs of; not active" (05/28/2013)  . Migraines     "a few times/yr" (05/28/2013)  . Seizure ([place])     "today; < 1 yr ago, they just happen; started having them in my 20's" (05/28/2013)  . Chronic lower back pain   . Anxiety   . Chronic abdominal  pain   . IBS (irritable bowel syndrome)   . Nausea and vomiting     chronic, recurrent  . Diverticulosis    Past Surgical History  Procedure Laterality Date  . Cholecystectomy  1978  . Abdominal hysterectomy  1978    "partial" (05/28/2013)  . Bilateral oophorectomy Bilateral 1987  . Colonoscopy N/A 05/31/2013    Procedure: COLONOSCOPY;  Surgeon: Wonda Horner, MD;  Location: [facility] ENDOSCOPY;  Service: Endoscopy;  Laterality: N/A;  . Esophagogastroduodenoscopy (egd) with propofol N/A 05/11/2014    Procedure: ESOPHAGOGASTRODUODENOSCOPY (EGD) WITH PROPOFOL;  Surgeon: Lear Ng, MD;  Location: [facility] ENDOSCOPY;  Service: Endoscopy;  Laterality: N/A;  egd first  . Flexible sigmoidoscopy N/A 05/11/2014    Procedure: FLEXIBLE SIGMOIDOSCOPY;  Surgeon: Lear Ng, MD;  Location: [facility] ENDOSCOPY;  Service: Endoscopy;  Laterality: N/A;   Family History  Problem Relation Age of Onset  . Colon cancer Brother    Social History  Substance Use Topics  . Smoking status: Never Smoker   . Smokeless tobacco: Never Used  . Alcohol Use: Yes     Comment: 05/28/2013 "glass of wine rarely; weddings, etc"   OB History    No data available     Review of Systems  Constitutional: Positive for fatigue. Negative for fever and chills.  Eyes: Positive for photophobia. Negative for blurred vision.  Respiratory: Negative  for cough, shortness of breath and wheezing.   Cardiovascular: Negative for chest pain and near-syncope.  Gastrointestinal: Positive for nausea. Negative for vomiting and abdominal pain.  Musculoskeletal: Positive for myalgias and neck pain. Negative for back pain and neck stiffness.  Skin: Negative for rash.  Neurological: Positive for seizures and headaches. Negative for dizziness and weakness.  Psychiatric/Behavioral: Positive for confusion. The patient is nervous/anxious.   All other systems reviewed and are negative.   Allergies  Aspirin; Darvocet; Isometheptene-dichloral-apap;  Penicillins; Tape; Toradol; Tylenol; Wellbutrin; and Dilaudid  Home Medications   Prior to Admission medications   Medication Sig Start Date End Date Taking? Authorizing Provider  cycloSPORINE (RESTASIS) 0.05 % ophthalmic emulsion Place 1 drop into both eyes 2 (two) times daily.   Yes Historical Provider, MD  diazepam (VALIUM) 5 MG tablet Take 5 mg by mouth daily as needed. 02/06/16  Yes Historical Provider, MD  dicyclomine (BENTYL) 20 MG tablet Take 20 mg by mouth 4 (four) times daily -  before meals and at bedtime.   Yes Historical Provider, MD  DiphenhydrAMINE HCl (THERAFLU MULTI SYMPTOM PO) Take 1 packet by mouth every 8 (eight) hours as needed (for symptoms).   Yes Historical Provider, MD  docusate sodium (COLACE) 100 MG capsule Take 100 mg by mouth daily as needed for mild constipation.   Yes Historical Provider, MD  FLUoxetine (PROZAC) 20 MG capsule Take 20 mg by mouth every morning.   Yes Historical Provider, MD  levETIRAcetam (KEPPRA XR) 500 MG 24 hr tablet Take 500 mg by mouth 2 (two) times daily.    Yes Historical Provider, MD  Multiple Vitamin (MULTIVITAMIN WITH MINERALS) TABS tablet Take 1 tablet by mouth daily.   Yes Historical Provider, MD  Oxycodone HCl 10 MG TABS Take 10 mg by mouth 3 (three) times daily as needed (for pain).  02/22/16  Yes Historical Provider, MD  Propylene Glycol (SYSTANE BALANCE OP) Place 2 drops into both eyes 2 (two) times daily as needed (dry eyes).    Yes Historical Provider, MD  triamterene-hydrochlorothiazide (MAXZIDE-25) 37.5-25 MG per tablet Take 1 tablet by mouth daily. 06/16/14  Yes Historical Provider, MD  cyclobenzaprine (FLEXERIL) 5 MG tablet Take 1 tablet (5 mg total) by mouth 3 (three) times daily as needed for muscle spasms. Patient not taking: Reported on 04/17/2015 10/01/14   Billy Fischer, MD  HYDROcodone-acetaminophen (NORCO/VICODIN) 5-325 MG per tablet 1 or 2 tabs PO q6 hours prn pain Patient not taking: Reported on 11/20/2015 08/17/14    Francine Graven, DO  LORazepam (ATIVAN) 1 MG tablet Take 1 tablet (1 mg total) by mouth 3 (three) times daily as needed for anxiety. 11/18/14   Hyman Bible, PA-C  mesalamine (ROWASA) 4 G enema Place 4 g rectally at bedtime as needed (colitis flare up.).     Historical Provider, MD  naproxen (NAPROSYN) 500 MG tablet Take 1 tablet (500 mg total) by mouth 2 (two) times daily as needed for mild pain, moderate pain or headache (TAKE WITH MEALS.). Patient not taking: Reported on 11/17/2014 11/05/14   Mercedes Camprubi-Soms, PA-C  ondansetron (ZOFRAN) 4 MG tablet Take 1 tablet (4 mg total) by mouth every 6 (six) hours. Patient not taking: Reported on 04/17/2015 01/19/15   Ezequiel Essex, MD  oxyCODONE (ROXICODONE) 5 MG immediate release tablet Take 1 tablet (5 mg total) by mouth every 4 (four) hours as needed for severe pain. Patient not taking: Reported on 04/17/2015 11/18/14   Hyman Bible, PA-C   BP 121/71 mmHg  Pulse 78  Temp(Src) 97.9 F (36.6 C) (Oral)  Resp 14  Wt 95.255 kg  SpO2 97% Physical Exam  Constitutional: She appears well-developed and well-nourished. No distress.  Answers ?s appropriately, not confused. Tearful, photophobic  HENT:  Head: Normocephalic.  Nose: Nose normal.  Healing abrasion to left forehead.    Eyes: Conjunctivae are normal. Pupils are equal, round, and reactive to light.  Neck: Normal range of motion. Neck supple. No tracheal deviation present.  No nuchal rigidity. No c spine ttp, ttp to right trapezius  Cardiovascular: Normal rate, regular rhythm, normal heart sounds and intact distal pulses.   No murmur heard. Pulmonary/Chest: Effort normal and breath sounds normal. No respiratory distress. She has no rales.  Abdominal: Soft. Bowel sounds are normal. She exhibits no distension and no mass. There is no tenderness. There is no guarding.  Musculoskeletal: Normal range of motion. She exhibits no edema or tenderness.  Neurological:  Alert and oriented x3  (not time) . Has multiple inconsistencies on exam such as unable to move tongue side to side but then able to push tongue inside mouth and protrude cheek; unable to smile and feigns droop with testing by pulling lip downwards, then able to show all teeth with activating nasolabial folds) CN 3-12 tested and without deficit. 5/5 muscle strength in all extremities with flexion and extension (good contracture, poor effort, easily fatigues).  Normal bulk and tone.  No sensory deficit to light touch.  Symmetric and equal 2+ patellar and brachioradialis DTRs.  No pronator drift.  Normal heel-to-shin and finger-to-nose.  Toes flexor bilaterally.   Skin: Skin is warm. No rash noted.  Psychiatric:  Anxious  Nursing note and vitals reviewed.   ED Course  Procedures (including critical care time) Labs Review Labs Reviewed  BASIC METABOLIC PANEL - Abnormal; Notable for the following:    Chloride 100 (*)    Glucose, Bld 111 (*)    All other components within normal limits  CBC - Abnormal; Notable for the following:    RBC 5.28 (*)    MCH 25.8 (*)    All other components within normal limits  URINALYSIS, ROUTINE W REFLEX MICROSCOPIC (NOT AT [facility])  CBG MONITORING, ED    Imaging Review Ct Head Wo Contrast  03/19/2016  CLINICAL DATA:  Fall Sunday with head injury. Initial encounter. EXAM: CT HEAD WITHOUT CONTRAST TECHNIQUE: Contiguous axial images were obtained from the base of the skull through the vertex without intravenous contrast. COMPARISON:  05/27/2013 FINDINGS: Skull and Sinuses:Negative for fracture or hemosinus. Inflammatory mucosal thickening and trapped secretions in the right sphenoid sinus, incidental given the history. Visualized orbits: Negative. Brain: Unremarkable. No evidence of acute infarction, hemorrhage, hydrocephalus, or mass lesion/mass effect. IMPRESSION: Negative for intracranial injury or fracture. Electronically Signed   By: Monte Fantasia M.D.   On: 03/19/2016 15:58   I have  personally reviewed and evaluated these images and lab results as part of my medical decision-making.   EKG Interpretation None      MDM   Final diagnoses:  Concussion, without loss of consciousness, initial encounter  Seizure ([place])  Other fatigue    CT head obtained in triage neg for intracranial pathology, no mass or blood.  No shift.   Considered syncope as source, no murmur on exam, EKG without dysrhthymia or ischemia.  Primary dx of sz most likley given PMH of such and incontinence.  Likely has concussion given fatigue, photophobia, head trauma, slow thinking. Labs unremarkable. Neuro exam reassuring, no  focal deficit.  No infectious concerns.  Afebrile, no rigidity.  Not cw cluster or TGN.   9:09 PM ambulated to bathroom without ataxia.  No help needed.  HA has improved, asking to go home.  CN -3-12 normal.  nml FTN.    Angiocath insertion Performed by: Tammy Sours  Consent: Verbal consent obtained. Risks and benefits: risks, benefits and alternatives were discussed Time out: Immediately prior to procedure a "time out" was called to verify the correct patient, procedure, equipment, support staff and site/side marked as required.  Preparation: Patient was prepped and draped in the usual sterile fashion.  Vein Location: right forearm  Ultrasound Guided  Gauge: 20  Normal blood return and flush without difficulty Patient tolerance: Patient tolerated the procedure well with no immediate complications.    Counseled on return precautions. Discussed concussion care and expected course of sx. Will have PCP f/u this week.    Tammy Sours, MD 03/19/16 2340  Julianne Rice, MD 03/20/16 [phone]

## 2016-03-19 NOTE — ED Notes (Signed)
IV start unsuccessful x 2. Will get another RN to attempt.

## 2016-03-19 NOTE — ED Notes (Signed)
Ambulated patient to the restroom patient stated she was a little dizzy but as she stood and begin to walk she stated that she was no longer dizzy and felt better walking. Patient also stated the same to the resident on duty.Marland Kitchen

## 2016-03-19 NOTE — ED Notes (Signed)
Pt states, "I fell at home Sunday when I was getting ready for church. I woke up & I had wet myself. I have a history of seizures." pt has abrasion to L forehead, pt takes Keppra as prescribed per pt, pt moves all extremities, pt alert to person, disoriented to time, pt c/o HA, no slurred speech  Present, no facial droop noted, follows commands, speaks in complete sentences, answers questions appropriately

## 2016-03-19 NOTE — ED Notes (Signed)
IV team RN at bedside.  

## 2016-03-19 NOTE — Discharge Instructions (Signed)
Concussion, Adult  A concussion, or closed-head injury, is a brain injury caused by a direct blow to the head or by a quick and sudden movement (jolt) of the head or neck. Concussions are usually not life-threatening. Even so, the effects of a concussion can be serious. If you have had a concussion before, you are more likely to experience concussion-like symptoms after a direct blow to the head.   CAUSES  · Direct blow to the head, such as from running into another player during a soccer game, being hit in a fight, or hitting your head on a hard surface.  · A jolt of the head or neck that causes the brain to move back and forth inside the skull, such as in a car crash.  SIGNS AND SYMPTOMS  The signs of a concussion can be hard to notice. Early on, they may be missed by you, family members, and health care providers. You may look fine but act or feel differently.  Symptoms are usually temporary, but they may last for days, weeks, or even longer. Some symptoms may appear right away while others may not show up for hours or days. Every head injury is different. Symptoms include:  · Mild to moderate headaches that will not go away.  · A feeling of pressure inside your head.  · Having more trouble than usual:    Learning or remembering things you have heard.    Answering questions.    Paying attention or concentrating.    Organizing daily tasks.    Making decisions and solving problems.  · Slowness in thinking, acting or reacting, speaking, or reading.  · Getting lost or being easily confused.  · Feeling tired all the time or lacking energy (fatigued).  · Feeling drowsy.  · Sleep disturbances.    Sleeping more than usual.    Sleeping less than usual.    Trouble falling asleep.    Trouble sleeping (insomnia).  · Loss of balance or feeling lightheaded or dizzy.  · Nausea or vomiting.  · Numbness or tingling.  · Increased sensitivity to:    Sounds.    Lights.    Distractions.  · Vision problems or eyes that tire  easily.  · Diminished sense of taste or smell.  · Ringing in the ears.  · Mood changes such as feeling sad or anxious.  · Becoming easily irritated or angry for little or no reason.  · Lack of motivation.  · Seeing or hearing things other people do not see or hear (hallucinations).  DIAGNOSIS  Your health care provider can usually diagnose a concussion based on a description of your injury and symptoms. He or she will ask whether you passed out (lost consciousness) and whether you are having trouble remembering events that happened right before and during your injury.  Your evaluation might include:  · A brain scan to look for signs of injury to the brain. Even if the test shows no injury, you may still have a concussion.  · Blood tests to be sure other problems are not present.  TREATMENT  · Concussions are usually treated in an emergency department, in urgent care, or at a clinic. You may need to stay in the hospital overnight for further treatment.  · Tell your health care provider if you are taking any medicines, including prescription medicines, over-the-counter medicines, and natural remedies. Some medicines, such as blood thinners (anticoagulants) and aspirin, may increase the chance of complications. Also tell your health care   provider whether you have had alcohol or are taking illegal drugs. This information may affect treatment.  · Your health care provider will send you home with important instructions to follow.  · How fast you will recover from a concussion depends on many factors. These factors include how severe your concussion is, what part of your brain was injured, your age, and how healthy you were before the concussion.  · Most people with mild injuries recover fully. Recovery can take time. In general, recovery is slower in older persons. Also, persons who have had a concussion in the past or have other medical problems may find that it takes longer to recover from their current injury.  HOME  CARE INSTRUCTIONS  General Instructions  · Carefully follow the directions your health care provider gave you.  · Only take over-the-counter or prescription medicines for pain, discomfort, or fever as directed by your health care provider.  · Take only those medicines that your health care provider has approved.  · Do not drink alcohol until your health care provider says you are well enough to do so. Alcohol and certain other drugs may slow your recovery and can put you at risk of further injury.  · If it is harder than usual to remember things, write them down.  · If you are easily distracted, try to do one thing at a time. For example, do not try to watch TV while fixing dinner.  · Talk with family members or close friends when making important decisions.  · Keep all follow-up appointments. Repeated evaluation of your symptoms is recommended for your recovery.  · Watch your symptoms and tell others to do the same. Complications sometimes occur after a concussion. Older adults with a brain injury may have a higher risk of serious complications, such as a blood clot on the brain.  · Tell your teachers, school nurse, school counselor, coach, athletic trainer, or work manager about your injury, symptoms, and restrictions. Tell them about what you can or cannot do. They should watch for:    Increased problems with attention or concentration.    Increased difficulty remembering or learning new information.    Increased time needed to complete tasks or assignments.    Increased irritability or decreased ability to cope with stress.    Increased symptoms.  · Rest. Rest helps the brain to heal. Make sure you:    Get plenty of sleep at night. Avoid staying up late at night.    Keep the same bedtime hours on weekends and weekdays.    Rest during the day. Take daytime naps or rest breaks when you feel tired.  · Limit activities that require a lot of thought or concentration. These include:    Doing homework or job-related  work.    Watching TV.    Working on the computer.  · Avoid any situation where there is potential for another head injury (football, hockey, soccer, basketball, martial arts, downhill snow sports and horseback riding). Your condition will get worse every time you experience a concussion. You should avoid these activities until you are evaluated by the appropriate follow-up health care providers.  Returning To Your Regular Activities  You will need to return to your normal activities slowly, not all at once. You must give your body and brain enough time for recovery.  · Do not return to sports or other athletic activities until your health care provider tells you it is safe to do so.  · Ask   your health care provider when you can drive, ride a bicycle, or operate heavy machinery. Your ability to react may be slower after a brain injury. Never do these activities if you are dizzy.  · Ask your health care provider about when you can return to work or school.  Preventing Another Concussion  It is very important to avoid another brain injury, especially before you have recovered. In rare cases, another injury can lead to permanent brain damage, brain swelling, or death. The risk of this is greatest during the first 7-10 days after a head injury. Avoid injuries by:  · Wearing a seat belt when riding in a car.  · Drinking alcohol only in moderation.  · Wearing a helmet when biking, skiing, skateboarding, skating, or doing similar activities.  · Avoiding activities that could lead to a second concussion, such as contact or recreational sports, until your health care provider says it is okay.  · Taking safety measures in your home.    Remove clutter and tripping hazards from floors and stairways.    Use grab bars in bathrooms and handrails by stairs.    Place non-slip mats on floors and in bathtubs.    Improve lighting in dim areas.  SEEK MEDICAL CARE IF:  · You have increased problems paying attention or  concentrating.  · You have increased difficulty remembering or learning new information.  · You need more time to complete tasks or assignments than before.  · You have increased irritability or decreased ability to cope with stress.  · You have more symptoms than before.  Seek medical care if you have any of the following symptoms for more than 2 weeks after your injury:  · Lasting (chronic) headaches.  · Dizziness or balance problems.  · Nausea.  · Vision problems.  · Increased sensitivity to noise or light.  · Depression or mood swings.  · Anxiety or irritability.  · Memory problems.  · Difficulty concentrating or paying attention.  · Sleep problems.  · Feeling tired all the time.  SEEK IMMEDIATE MEDICAL CARE IF:  · You have severe or worsening headaches. These may be a sign of a blood clot in the brain.  · You have weakness (even if only in one hand, leg, or part of the face).  · You have numbness.  · You have decreased coordination.  · You vomit repeatedly.  · You have increased sleepiness.  · One pupil is larger than the other.  · You have convulsions.  · You have slurred speech.  · You have increased confusion. This may be a sign of a blood clot in the brain.  · You have increased restlessness, agitation, or irritability.  · You are unable to recognize people or places.  · You have neck pain.  · It is difficult to wake you up.  · You have unusual behavior changes.  · You lose consciousness.  MAKE SURE YOU:  · Understand these instructions.  · Will watch your condition.  · Will get help right away if you are not doing well or get worse.     This information is not intended to replace advice given to you by your health care provider. Make sure you discuss any questions you have with your health care provider.     Document Released: 12/28/2003 Document Revised: 10/28/2014 Document Reviewed: 04/29/2013  Elsevier Interactive Patient Education ©2016 [employer]

## 2016-03-20 ENCOUNTER — Emergency Department (HOSPITAL_COMMUNITY)
Admission: EM | Admit: 2016-03-20 | Discharge: 2016-03-20 | Disposition: A | Discharge status: Home / Self Care | Payer: Medicare Other | Attending: Emergency Medicine | Admitting: Emergency Medicine

## 2016-03-20 ENCOUNTER — Encounter (HOSPITAL_COMMUNITY): Payer: Self-pay

## 2016-03-20 ENCOUNTER — Emergency Department (HOSPITAL_COMMUNITY): Payer: Medicare Other

## 2016-03-20 DIAGNOSIS — R51 Headache: Secondary | ICD-10-CM | POA: Diagnosis not present

## 2016-03-20 DIAGNOSIS — E785 Hyperlipidemia, unspecified: Secondary | ICD-10-CM | POA: Diagnosis not present

## 2016-03-20 DIAGNOSIS — Z79899 Other long term (current) drug therapy: Secondary | ICD-10-CM | POA: Diagnosis not present

## 2016-03-20 DIAGNOSIS — I10 Essential (primary) hypertension: Secondary | ICD-10-CM | POA: Insufficient documentation

## 2016-03-20 DIAGNOSIS — G44319 Acute post-traumatic headache, not intractable: Secondary | ICD-10-CM | POA: Diagnosis not present

## 2016-03-20 DIAGNOSIS — M542 Cervicalgia: Secondary | ICD-10-CM | POA: Diagnosis not present

## 2016-03-20 DIAGNOSIS — S199XXA Unspecified injury of neck, initial encounter: Secondary | ICD-10-CM | POA: Diagnosis not present

## 2016-03-20 DIAGNOSIS — S0990XA Unspecified injury of head, initial encounter: Secondary | ICD-10-CM | POA: Diagnosis not present

## 2016-03-20 MED ORDER — METOCLOPRAMIDE HCL 5 MG/ML IJ SOLN
10.0000 mg | Freq: Once | INTRAMUSCULAR | Status: AC
  Administered 2016-03-20: 10 mg via INTRAMUSCULAR
  Filled 2016-03-20: qty 2

## 2016-03-20 MED ORDER — MORPHINE SULFATE (PF) 10 MG/ML IV SOLN
10.0000 mg | Freq: Once | INTRAVENOUS | Status: AC
  Administered 2016-03-20: 10 mg via INTRAMUSCULAR
  Filled 2016-03-20: qty 1

## 2016-03-20 MED ORDER — DIPHENHYDRAMINE HCL 25 MG PO CAPS
50.0000 mg | ORAL_CAPSULE | Freq: Once | ORAL | Status: AC
  Administered 2016-03-20: 50 mg via ORAL
  Filled 2016-03-20: qty 2

## 2016-03-20 MED ORDER — OXYCODONE HCL 5 MG PO TABS: 5.0000 mg | ORAL_TABLET | ORAL | Status: DC | PRN

## 2016-03-20 NOTE — ED Notes (Signed)
Pt. Ambulated to bathroom independently with steady gait. Pt. Denies lightheadedness or pain with ambulation.

## 2016-03-20 NOTE — ED Notes (Signed)
Patient transported to CT 

## 2016-03-20 NOTE — ED Provider Notes (Signed)
CSN: [id number]     Arrival date & time 03/20/16  0806 History   First MD Initiated Contact with Patient 03/20/16 [phone]     Chief Complaint  Patient presents with  . Headache     (Consider location/radiation/quality/duration/timing/severity/associated sxs/prior Treatment) HPI  60 year old female with a history of seizures, hepatitis C, and chronic pain presents with a headache. Patient states the headache is frontal and in the back and base of her neck. Patient possibly had a seizure and hit her head yesterday. Whenever she woke up she was having a headache. The headache today is worse than the one yesterday but is similar in quality. It feels like someone is banging on the inside of her head. Patient states she feels generally weak. She was given medicine yesterday and states that her headache resolved temporarily. However when she got home her pain has worsened. She states she was unable to control the pain and so she came in to the ER. No fevers. No focal weakness but she states she's been having intermittent facial twitching and left eye clear watery discharge. Has been having these are husband help walk at home.  Past Medical History  Diagnosis Date  . Hypertension   . Ulcerative colitis   . Seizure disorder ([place])   . HTN (hypertension), benign 01/03/2012  . Depression 01/03/2012  . 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 Hyperlipidemia 01/03/2012  . Fibroids 01/03/2012  . Chest pain, exertional   . SLE (systemic lupus erythematosus) ([place])   . GERD (gastroesophageal reflux disease)   . Hepatitis C     "signs of; not active" (05/28/2013)  . Migraines     "a few times/yr" (05/28/2013)  . Seizure ([place])     "today; < 1 yr ago, they just happen; started having them in my 20's" (05/28/2013)  . Chronic lower back pain   . Anxiety   . Chronic abdominal pain   . IBS (irritable bowel syndrome)   . Nausea and vomiting     chronic, recurrent  . Diverticulosis    Past Surgical History  Procedure  Laterality Date  . Cholecystectomy  1978  . Abdominal hysterectomy  1978    "partial" (05/28/2013)  . Bilateral oophorectomy Bilateral 1987  . Colonoscopy N/A 05/31/2013    Procedure: COLONOSCOPY;  Surgeon: Wonda Horner, MD;  Location: [facility] ENDOSCOPY;  Service: Endoscopy;  Laterality: N/A;  . Esophagogastroduodenoscopy (egd) with propofol N/A 05/11/2014    Procedure: ESOPHAGOGASTRODUODENOSCOPY (EGD) WITH PROPOFOL;  Surgeon: Lear Ng, MD;  Location: [facility] ENDOSCOPY;  Service: Endoscopy;  Laterality: N/A;  egd first  . Flexible sigmoidoscopy N/A 05/11/2014    Procedure: FLEXIBLE SIGMOIDOSCOPY;  Surgeon: Lear Ng, MD;  Location: [facility] ENDOSCOPY;  Service: Endoscopy;  Laterality: N/A;   Family History  Problem Relation Age of Onset  . Colon cancer Brother    Social History  Substance Use Topics  . Smoking status: Never Smoker   . Smokeless tobacco: Never Used  . Alcohol Use: Yes     Comment: 05/28/2013 "glass of wine rarely; weddings, etc"   OB History    No data available     Review of Systems  Eyes: Positive for photophobia. Negative for visual disturbance.  Gastrointestinal: Positive for vomiting ("a small amount this morning"). Negative for abdominal pain.  Musculoskeletal: Positive for back pain and neck pain.  Neurological: Positive for weakness and headaches.  All other systems reviewed and are negative.     Allergies  Aspirin; Darvocet; Isometheptene-dichloral-apap; Penicillins; Tape; Toradol;  Tylenol; Wellbutrin; and Dilaudid  Home Medications   Prior to Admission medications   Medication Sig Start Date End Date Taking? Authorizing Provider  cyclobenzaprine (FLEXERIL) 5 MG tablet Take 1 tablet (5 mg total) by mouth 3 (three) times daily as needed for muscle spasms. Patient not taking: Reported on 04/17/2015 10/01/14   Billy Fischer, MD  cycloSPORINE (RESTASIS) 0.05 % ophthalmic emulsion Place 1 drop into both eyes 2 (two) times daily.    Historical Provider,  MD  diazepam (VALIUM) 5 MG tablet Take 5 mg by mouth daily as needed. 02/06/16   Historical Provider, MD  dicyclomine (BENTYL) 20 MG tablet Take 20 mg by mouth 4 (four) times daily -  before meals and at bedtime.    Historical Provider, MD  DiphenhydrAMINE HCl (THERAFLU MULTI SYMPTOM PO) Take 1 packet by mouth every 8 (eight) hours as needed (for symptoms).    Historical Provider, MD  docusate sodium (COLACE) 100 MG capsule Take 100 mg by mouth daily as needed for mild constipation.    Historical Provider, MD  FLUoxetine (PROZAC) 20 MG capsule Take 20 mg by mouth every morning.    Historical Provider, MD  HYDROcodone-acetaminophen (NORCO/VICODIN) 5-325 MG per tablet 1 or 2 tabs PO q6 hours prn pain Patient not taking: Reported on 11/20/2015 08/17/14   Francine Graven, DO  levETIRAcetam (KEPPRA XR) 500 MG 24 hr tablet Take 500 mg by mouth 2 (two) times daily.     Historical Provider, MD  LORazepam (ATIVAN) 1 MG tablet Take 1 tablet (1 mg total) by mouth 3 (three) times daily as needed for anxiety. 11/18/14   Hyman Bible, PA-C  mesalamine (ROWASA) 4 G enema Place 4 g rectally at bedtime as needed (colitis flare up.).     Historical Provider, MD  Multiple Vitamin (MULTIVITAMIN WITH MINERALS) TABS tablet Take 1 tablet by mouth daily.    Historical Provider, MD  naproxen (NAPROSYN) 500 MG tablet Take 1 tablet (500 mg total) by mouth 2 (two) times daily as needed for mild pain, moderate pain or headache (TAKE WITH MEALS.). Patient not taking: Reported on 11/17/2014 11/05/14   Mercedes Camprubi-Soms, PA-C  ondansetron (ZOFRAN) 4 MG tablet Take 1 tablet (4 mg total) by mouth every 6 (six) hours. Patient not taking: Reported on 04/17/2015 01/19/15   Ezequiel Essex, MD  oxyCODONE (ROXICODONE) 5 MG immediate release tablet Take 1 tablet (5 mg total) by mouth every 4 (four) hours as needed for severe pain. Patient not taking: Reported on 04/17/2015 11/18/14   Hyman Bible, PA-C  Oxycodone HCl 10 MG TABS Take  10 mg by mouth 3 (three) times daily as needed (for pain).  02/22/16   Historical Provider, MD  Propylene Glycol (SYSTANE BALANCE OP) Place 2 drops into both eyes 2 (two) times daily as needed (dry eyes).     Historical Provider, MD  triamterene-hydrochlorothiazide (MAXZIDE-25) 37.5-25 MG per tablet Take 1 tablet by mouth daily. 06/16/14   Historical Provider, MD   BP 120/77 mmHg  Pulse 82  SpO2 98% Physical Exam  Constitutional: She is oriented to person, place, and time. She appears well-developed and well-nourished.  HENT:  Head: Normocephalic. Head is with abrasion.    Right Ear: External ear normal.  Left Ear: External ear normal.  Nose: Nose normal.  Eyes: EOM are normal. Pupils are equal, round, and reactive to light. Right eye exhibits no discharge. Left eye exhibits no discharge.  Neck: Normal range of motion. Neck supple. Muscular tenderness present.  Cardiovascular: Normal  rate, regular rhythm and normal heart sounds.   Pulmonary/Chest: Effort normal and breath sounds normal.  Abdominal: Soft. There is no tenderness.  Neurological: She is alert and oriented to person, place, and time.  Normal smile, no facial droop. No slurred speech. Patient reports no sensation on either side of her face in any CN V distrubution except over her abrasion. Patient has equal but diffusely weak strength in all 4 extremities. Normal sensation. Seems to be poor effort. When I lift her arms up she can hold them up with no pronator drift. However when I ask her to pull her arms against mine she gives such little effort that her arms don't move toward her body or push me away when I put no resistance at all.  Skin: Skin is warm and dry.  Nursing note and vitals reviewed.   ED Course  Procedures (including critical care time) Labs Review Labs Reviewed - No data to display  Imaging Review Ct Head Wo Contrast  03/20/2016  CLINICAL DATA:  Fall 3 days ago with frontal headache getting worse. Posterior  neck pain. EXAM: CT HEAD WITHOUT CONTRAST CT CERVICAL SPINE WITHOUT CONTRAST TECHNIQUE: Multidetector CT imaging of the head and cervical spine was performed following the standard protocol without intravenous contrast. Multiplanar CT image reconstructions of the cervical spine were also generated. COMPARISON:  Head CT 03/19/2016, 05/27/2016 and head/ cervical spine CT 11/03/2011 FINDINGS: CT HEAD FINDINGS Ventricles, cisterns and other CSF spaces are normal. There is no mass, mass effect, shift of midline structures or acute hemorrhage. No evidence of acute infarction. Bones and soft tissues are within normal. CT CERVICAL SPINE FINDINGS There is straightening of the normal cervical lordosis. There is mild to moderate spondylosis throughout the cervical spine. Mild disc space narrowing from the C4-5 level to the C6-7 level. Prevertebral soft tissues are within normal. Atlantoaxial articulation is within normal. There is minimal uncovertebral joint spurring. There is no acute fracture or subluxation. Remaining bones and soft tissues are within normal. IMPRESSION: No acute intracranial findings. No acute cervical spine injury. 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 Electronically Signed   By: Marin Olp M.D.   On: 03/20/2016 09:47   Ct Head Wo Contrast  03/19/2016  CLINICAL DATA:  Fall Sunday with head injury. Initial encounter. EXAM: CT HEAD WITHOUT CONTRAST TECHNIQUE: Contiguous axial images were obtained from the base of the skull through the vertex without intravenous contrast. COMPARISON:  05/27/2013 FINDINGS: Skull and Sinuses:Negative for fracture or hemosinus. Inflammatory mucosal thickening and trapped secretions in the right sphenoid sinus, incidental given the history. Visualized orbits: Negative. Brain: Unremarkable. No evidence of acute infarction, hemorrhage, hydrocephalus, or mass lesion/mass effect. IMPRESSION: Negative for  intracranial injury or fracture. Electronically Signed   By: Monte Fantasia M.D.   On: 03/19/2016 15:58   Ct Cervical Spine Wo Contrast  03/20/2016  CLINICAL DATA:  Fall 3 days ago with frontal headache getting worse. Posterior neck pain. EXAM: CT HEAD WITHOUT CONTRAST CT CERVICAL SPINE WITHOUT CONTRAST TECHNIQUE: Multidetector CT imaging of the head and cervical spine was performed following the standard protocol without intravenous contrast. Multiplanar CT image reconstructions of the cervical spine were also generated. COMPARISON:  Head CT 03/19/2016, 05/27/2016 and head/ cervical spine CT 11/03/2011 FINDINGS: CT HEAD FINDINGS Ventricles, cisterns and other CSF spaces are normal. There is no mass, mass effect, shift of midline structures or acute hemorrhage. No evidence of acute infarction.  Bones and soft tissues are within normal. CT CERVICAL SPINE FINDINGS There is straightening of the normal cervical lordosis. There is mild to moderate spondylosis throughout the cervical spine. Mild disc space narrowing from the C4-5 level to the C6-7 level. Prevertebral soft tissues are within normal. Atlantoaxial articulation is within normal. There is minimal uncovertebral joint spurring. There is no acute fracture or subluxation. Remaining bones and soft tissues are within normal. IMPRESSION: No acute intracranial findings. No acute cervical spine injury. 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 Electronically Signed   By: Marin Olp M.D.   On: 03/20/2016 09:47   I have personally reviewed and evaluated these images and lab results as part of my medical decision-making.   EKG Interpretation None      MDM   Final diagnoses:  Acute post-traumatic headache, not intractable    Patient's neuro exam is quite inconsistent. However despite her inconsistencies there is also no focal neurologic deficit or unilateral deficit. After Reglan and  Benadryl she states her headache is still present and demands to why she is still having pain. Likely this is related to a concussion given association with fall and mild head injury. Repeat CT shows no bleed and CT cervical spine is unremarkable. I highly doubt a cord injury or ligamentous injury. She was able to get up and walk normally despite her complaints of diffuse weakness. Will treat with pain medicine but I think overall this is likely a concussion. Discharge home with follow-up with PCP and her neurologist. labwork from yesterday benign, given normal vitals I don't think repeat labwork indicated    Sherwood Gambler, MD 03/20/16 1747

## 2016-03-20 NOTE — ED Notes (Signed)
Pt. Presents with complaint of headache. Pt. Seen yesterday and dx with concussion, states given nothing for pain at home and pain is unbearable. Pt. AxO x4.

## 2016-04-09 DIAGNOSIS — H524 Presbyopia: Secondary | ICD-10-CM | POA: Diagnosis not present

## 2016-04-09 DIAGNOSIS — H04123 Dry eye syndrome of bilateral lacrimal glands: Secondary | ICD-10-CM | POA: Diagnosis not present

## 2016-04-09 DIAGNOSIS — H5213 Myopia, bilateral: Secondary | ICD-10-CM | POA: Diagnosis not present

## 2016-04-09 DIAGNOSIS — H2513 Age-related nuclear cataract, bilateral: Secondary | ICD-10-CM | POA: Diagnosis not present

## 2016-04-09 DIAGNOSIS — R51 Headache: Secondary | ICD-10-CM | POA: Diagnosis not present

## 2016-04-09 DIAGNOSIS — H52223 Regular astigmatism, bilateral: Secondary | ICD-10-CM | POA: Diagnosis not present

## 2016-04-29 DIAGNOSIS — T7840XA Allergy, unspecified, initial encounter: Secondary | ICD-10-CM | POA: Diagnosis not present

## 2016-04-29 HISTORY — PX: TRANSTHORACIC ECHOCARDIOGRAM: SHX275

## 2016-05-15 DIAGNOSIS — F324 Major depressive disorder, single episode, in partial remission: Secondary | ICD-10-CM | POA: Diagnosis not present

## 2016-05-15 DIAGNOSIS — Z8719 Personal history of other diseases of the digestive system: Secondary | ICD-10-CM | POA: Diagnosis not present

## 2016-05-15 DIAGNOSIS — R569 Unspecified convulsions: Secondary | ICD-10-CM | POA: Diagnosis not present

## 2016-05-15 DIAGNOSIS — I10 Essential (primary) hypertension: Secondary | ICD-10-CM | POA: Diagnosis not present

## 2016-05-15 DIAGNOSIS — E7800 Pure hypercholesterolemia, unspecified: Secondary | ICD-10-CM | POA: Diagnosis not present

## 2016-05-15 DIAGNOSIS — M150 Primary generalized (osteo)arthritis: Secondary | ICD-10-CM | POA: Diagnosis not present

## 2016-06-07 ENCOUNTER — Other Ambulatory Visit: Payer: Self-pay | Admitting: Gastroenterology

## 2016-06-07 DIAGNOSIS — Z1211 Encounter for screening for malignant neoplasm of colon: Secondary | ICD-10-CM | POA: Diagnosis not present

## 2016-06-07 DIAGNOSIS — K529 Noninfective gastroenteritis and colitis, unspecified: Secondary | ICD-10-CM | POA: Diagnosis not present

## 2016-06-07 DIAGNOSIS — K5180 Other ulcerative colitis without complications: Secondary | ICD-10-CM | POA: Diagnosis not present

## 2016-06-07 DIAGNOSIS — K2950 Unspecified chronic gastritis without bleeding: Secondary | ICD-10-CM | POA: Diagnosis not present

## 2016-06-07 DIAGNOSIS — K624 Stenosis of anus and rectum: Secondary | ICD-10-CM | POA: Diagnosis not present

## 2016-06-11 ENCOUNTER — Emergency Department (HOSPITAL_COMMUNITY)
Admission: EM | Admit: 2016-06-11 | Discharge: 2016-06-12 | Disposition: A | Discharge status: Home / Self Care | Payer: Medicare Other | Attending: Emergency Medicine | Admitting: Emergency Medicine

## 2016-06-11 ENCOUNTER — Encounter (HOSPITAL_COMMUNITY): Payer: Self-pay

## 2016-06-11 ENCOUNTER — Emergency Department (HOSPITAL_COMMUNITY): Payer: Medicare Other

## 2016-06-11 DIAGNOSIS — Z79899 Other long term (current) drug therapy: Secondary | ICD-10-CM | POA: Insufficient documentation

## 2016-06-11 DIAGNOSIS — R109 Unspecified abdominal pain: Secondary | ICD-10-CM | POA: Diagnosis not present

## 2016-06-11 DIAGNOSIS — R1031 Right lower quadrant pain: Secondary | ICD-10-CM | POA: Diagnosis not present

## 2016-06-11 DIAGNOSIS — I10 Essential (primary) hypertension: Secondary | ICD-10-CM | POA: Diagnosis not present

## 2016-06-11 DIAGNOSIS — R1030 Lower abdominal pain, unspecified: Secondary | ICD-10-CM | POA: Diagnosis not present

## 2016-06-11 DIAGNOSIS — R1032 Left lower quadrant pain: Secondary | ICD-10-CM | POA: Diagnosis not present

## 2016-06-11 LAB — COMPREHENSIVE METABOLIC PANEL
ALK PHOS: 87 U/L (ref 38–126)
ALT: 64 U/L — ABNORMAL HIGH (ref 14–54)
ANION GAP: 8 (ref 5–15)
AST: 29 U/L (ref 15–41)
Albumin: 3.8 g/dL (ref 3.5–5.0)
BILIRUBIN TOTAL: 0.4 mg/dL (ref 0.3–1.2)
BUN: 9 mg/dL (ref 6–20)
CALCIUM: 9.7 mg/dL (ref 8.9–10.3)
CO2: 26 mmol/L (ref 22–32)
Chloride: 102 mmol/L (ref 101–111)
Creatinine, Ser: 0.77 mg/dL (ref 0.44–1.00)
GFR calc Af Amer: >60 mL/min (ref >60)
Glucose, Bld: 106 mg/dL — ABNORMAL HIGH (ref 65–99)
POTASSIUM: 3.3 mmol/L — AB (ref 3.5–5.1)
Sodium: 136 mmol/L (ref 135–145)
TOTAL PROTEIN: 7.9 g/dL (ref 6.5–8.1)

## 2016-06-11 LAB — CBC
HEMATOCRIT: 44 % (ref 36.0–46.0)
Hemoglobin: 13.8 g/dL (ref 12.0–15.0)
MCH: 25.8 pg — ABNORMAL LOW (ref 26.0–34.0)
MCHC: 31.4 g/dL (ref 30.0–36.0)
MCV: 82.2 fL (ref 78.0–100.0)
Platelets: 374 10*3/uL (ref 150–400)
RBC: 5.35 MIL/uL — ABNORMAL HIGH (ref 3.87–5.11)
RDW: 15.3 % (ref 11.5–15.5)
WBC: 4.4 10*3/uL (ref 4.0–10.5)

## 2016-06-11 LAB — POC OCCULT BLOOD, ED: FECAL OCCULT BLD: NEGATIVE

## 2016-06-11 LAB — URINE MICROSCOPIC-ADD ON

## 2016-06-11 LAB — URINALYSIS, ROUTINE W REFLEX MICROSCOPIC
BILIRUBIN URINE: NEGATIVE
Glucose, UA: NEGATIVE mg/dL
Hgb urine dipstick: NEGATIVE
KETONES UR: NEGATIVE mg/dL
NITRITE: NEGATIVE
PH: 8.5 — AB (ref 5.0–8.0)
PROTEIN: NEGATIVE mg/dL
Specific Gravity, Urine: 1.021 (ref 1.005–1.030)

## 2016-06-11 LAB — LIPASE, BLOOD: Lipase: 21 U/L (ref 11–51)

## 2016-06-11 MED ORDER — DICYCLOMINE HCL 10 MG/ML IM SOLN
20.0000 mg | Freq: Once | INTRAMUSCULAR | Status: AC
  Administered 2016-06-11: 20 mg via INTRAMUSCULAR
  Filled 2016-06-11: qty 2

## 2016-06-11 MED ORDER — SODIUM CHLORIDE 0.9 % IV BOLUS (SEPSIS)
1000.0000 mL | Freq: Once | INTRAVENOUS | Status: AC
Start: 2016-06-11 — End: 2016-06-11
  Administered 2016-06-11: 1000 mL via INTRAVENOUS

## 2016-06-11 MED ORDER — MORPHINE SULFATE (PF) 4 MG/ML IV SOLN
4.0000 mg | Freq: Once | INTRAVENOUS | Status: AC
  Administered 2016-06-11: 4 mg via INTRAVENOUS
  Filled 2016-06-11: qty 1

## 2016-06-11 MED ORDER — FENTANYL CITRATE (PF) 100 MCG/2ML IJ SOLN
50.0000 ug | Freq: Once | INTRAMUSCULAR | Status: AC
  Administered 2016-06-11: 50 ug via INTRAVENOUS
  Filled 2016-06-11: qty 2

## 2016-06-11 MED ORDER — IOPAMIDOL (ISOVUE-300) INJECTION 61%
INTRAVENOUS | Status: AC
  Administered 2016-06-11: 100 mL
  Filled 2016-06-11: qty 100

## 2016-06-11 MED ORDER — ONDANSETRON HCL 4 MG/2ML IJ SOLN
4.0000 mg | Freq: Once | INTRAMUSCULAR | Status: AC
  Administered 2016-06-11: 4 mg via INTRAVENOUS
  Filled 2016-06-11: qty 2

## 2016-06-11 MED ORDER — MORPHINE SULFATE (PF) 4 MG/ML IV SOLN
4.0000 mg | Freq: Once | INTRAVENOUS | Status: AC
Start: 2016-06-11 — End: 2016-06-11
  Administered 2016-06-11: 4 mg via INTRAVENOUS
  Filled 2016-06-11: qty 1

## 2016-06-11 NOTE — ED Triage Notes (Signed)
Pt had colonoscopy 06-07-16, yesterday pt started having fever, abd cramping.  Today pt passed clot of blood from rectum.

## 2016-06-11 NOTE — ED Provider Notes (Signed)
[facility] DEPT Provider Note   CSN: [id number] Arrival date & time: 06/11/16  1509     History   Chief Complaint Chief Complaint  Patient presents with  . Abdominal Pain  . Rectal Bleeding    HPI Erin Wolf is a 60 y.o. female.  60 year old female with a history of hypertension, ulcerative colitis, seizure disorder, depression, dyslipidemia, fibroids, esophageal reflux, chronic low back pain, and chronic abdominal pain presents to the emergency department for evaluation of abdominal pain which began following a bowel movement today. She reports passing a bowel movement of blood clot, mucous, and a small amount of stool. Prior to this, patient reports passing small amounts of blood. She is 3 days s/p colonoscopy. Patient states her abdominal pain is infraumbilical and cramping. She has not taken any medication for her pain because her GI doctor advised that she come here to ensure no intraabdominal bleeding from her procedure. Patient reports fever of 101F yesterday; no fever today. She has some nausea without emesis. No CP, SOB, urinary symptoms. SHx significant for cholecystectomy, partial hysterectomy, and bilateral salpingo-oophorectomy.      Past Medical History:  Diagnosis Date  . Anxiety   . Chest pain, exertional   . Chronic abdominal pain   . Chronic lower back pain   . Depression 01/03/2012  . Diverticulosis   . 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 Fibroids 01/03/2012  . GERD (gastroesophageal reflux disease)   . Hepatitis C    "signs of; not active" (05/28/2013)  . HTN (hypertension), benign 01/03/2012  . Hyperlipidemia 01/03/2012  . Hypertension   . IBS (irritable bowel syndrome)   . Migraines    "a few times/yr" (05/28/2013)  . Nausea and vomiting    chronic, recurrent  . Seizure ([place])    "today; < 1 yr ago, they just happen; started having them in my 20's" (05/28/2013)  . Seizure disorder ([place])   . SLE (systemic lupus erythematosus) ([place])   . Ulcerative  colitis     Patient Active Problem List   Diagnosis Date Noted  . Dehydration 05/09/2014  . GERD (gastroesophageal reflux disease) 05/09/2014  . Gastroenteritis 05/09/2014  . Chest pain, atypical 05/09/2014  . Chronic ulcerative colitis ([place]) 05/09/2014  . Acute gastroenteritis 05/09/2014  . Ulcerative colitis ([place]) 05/27/2013  . Hypokalemia 05/27/2013  . Anemia 01/04/2012  . Transaminitis 01/04/2012  . Diarrhea 01/04/2012  . Syncope and collapse 01/03/2012  . Chest pain 01/03/2012  . SOB (shortness of breath) 01/03/2012  . HTN (hypertension), benign 01/03/2012  . Hx of migraines 01/03/2012  . Seizure ([place]) 01/03/2012  . Depression 01/03/2012  . Ulcerative colitis 01/03/2012  . Chronic back pain 01/03/2012  . 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 Hyperlipidemia 01/03/2012  . Hepatitis C 01/03/2012  . Fibroids 01/03/2012    Past Surgical History:  Procedure Laterality Date  . ABDOMINAL HYSTERECTOMY  1978   "partial" (05/28/2013)  . BILATERAL OOPHORECTOMY Bilateral 1987  . CHOLECYSTECTOMY  1978  . COLONOSCOPY N/A 05/31/2013   Procedure: COLONOSCOPY;  Surgeon: Wonda Horner, MD;  Location: [facility] ENDOSCOPY;  Service: Endoscopy;  Laterality: N/A;  . ESOPHAGOGASTRODUODENOSCOPY (EGD) WITH PROPOFOL N/A 05/11/2014   Procedure: ESOPHAGOGASTRODUODENOSCOPY (EGD) WITH PROPOFOL;  Surgeon: Lear Ng, MD;  Location: [facility] ENDOSCOPY;  Service: Endoscopy;  Laterality: N/A;  egd first  . FLEXIBLE SIGMOIDOSCOPY N/A 05/11/2014   Procedure: FLEXIBLE SIGMOIDOSCOPY;  Surgeon: Lear Ng, MD;  Location: [facility] ENDOSCOPY;  Service: Endoscopy;  Laterality: N/A;    OB History  No data available       Home Medications    Prior to Admission medications   Medication Sig Start Date End Date Taking? Authorizing Provider  cyclobenzaprine (FLEXERIL) 5 MG tablet Take 1 tablet (5 mg total) by mouth 3 (three) times daily as needed for muscle spasms. Patient not taking: Reported on 04/17/2015  10/01/14   Billy Fischer, MD  cycloSPORINE (RESTASIS) 0.05 % ophthalmic emulsion Place 1 drop into both eyes 2 (two) times daily.    Historical Provider, MD  diazepam (VALIUM) 5 MG tablet Take 5 mg by mouth daily as needed. 02/06/16   Historical Provider, MD  dicyclomine (BENTYL) 20 MG tablet Take 1 tablet (20 mg total) by mouth 4 (four) times daily -  before meals and at bedtime. 06/12/16   Antonietta Breach, PA-C  DiphenhydrAMINE HCl (THERAFLU MULTI SYMPTOM PO) Take 1 packet by mouth every 8 (eight) hours as needed (for symptoms).    Historical Provider, MD  docusate sodium (COLACE) 100 MG capsule Take 100 mg by mouth daily as needed for mild constipation.    Historical Provider, MD  FLUoxetine (PROZAC) 20 MG capsule Take 20 mg by mouth every morning.    Historical Provider, MD  HYDROcodone-acetaminophen (NORCO/VICODIN) 5-325 MG tablet Take 1-2 tablets by mouth every 6 (six) hours as needed for moderate pain. 1 or 2 tabs PO q6 hours prn pain 06/12/16   Antonietta Breach, PA-C  levETIRAcetam (KEPPRA XR) 500 MG 24 hr tablet Take 500 mg by mouth 2 (two) times daily.     Historical Provider, MD  LORazepam (ATIVAN) 1 MG tablet Take 1 tablet (1 mg total) by mouth 3 (three) times daily as needed for anxiety. 11/18/14   Hyman Bible, PA-C  mesalamine (ROWASA) 4 G enema Place 4 g rectally at bedtime as needed (colitis flare up.).     Historical Provider, MD  Multiple Vitamin (MULTIVITAMIN WITH MINERALS) TABS tablet Take 1 tablet by mouth daily.    Historical Provider, MD  naproxen (NAPROSYN) 500 MG tablet Take 1 tablet (500 mg total) by mouth 2 (two) times daily as needed for mild pain, moderate pain or headache (TAKE WITH MEALS.). Patient not taking: Reported on 11/17/2014 11/05/14   Mercedes Camprubi-Soms, PA-C  ondansetron (ZOFRAN) 4 MG tablet Take 1 tablet (4 mg total) by mouth every 6 (six) hours. Patient not taking: Reported on 04/17/2015 01/19/15   Ezequiel Essex, MD  oxyCODONE (ROXICODONE) 5 MG immediate release  tablet Take 1 tablet (5 mg total) by mouth every 4 (four) hours as needed for severe pain. 03/20/16   Sherwood Gambler, MD  Oxycodone HCl 10 MG TABS Take 10 mg by mouth 3 (three) times daily as needed (for pain).  02/22/16   Historical Provider, MD  promethazine (PHENERGAN) 25 MG tablet Take 1 tablet (25 mg total) by mouth every 6 (six) hours as needed for nausea or vomiting. 06/12/16   Antonietta Breach, PA-C  Propylene Glycol (SYSTANE BALANCE OP) Place 2 drops into both eyes 2 (two) times daily as needed (dry eyes).     Historical Provider, MD  triamterene-hydrochlorothiazide (MAXZIDE-25) 37.5-25 MG per tablet Take 1 tablet by mouth daily. 06/16/14   Historical Provider, MD    Family History Family History  Problem Relation Age of Onset  . Colon cancer Brother     Social History Social History  Substance Use Topics  . Smoking status: Never Smoker  . Smokeless tobacco: Never Used  . Alcohol use Yes     Comment: 05/28/2013 "  glass of wine rarely; weddings, etc"     Allergies   Aspirin; Darvocet [propoxyphene n-acetaminophen]; Isometheptene-dichloral-apap; Penicillins; Tape; Toradol [ketorolac tromethamine]; Tylenol [acetaminophen]; Wellbutrin [bupropion hcl]; and Dilaudid [hydromorphone hcl]   Review of Systems Review of Systems  Constitutional: Positive for fever (Reports 101F yesterday).  Respiratory: Negative for shortness of breath.   Gastrointestinal: Positive for abdominal pain, blood in stool and nausea. Negative for vomiting.  Genitourinary: Negative for dysuria.  Ten systems reviewed and are negative for acute change, except as noted in the HPI.    Physical Exam Updated Vital Signs BP 122/70   Pulse 83   Temp 98.1 F (36.7 C) (Oral)   Resp 16   SpO2 98%   Physical Exam  Constitutional: She is oriented to person, place, and time. She appears well-developed and well-nourished. No distress.  Patient anxious appearing and tearful  HENT:  Head: Normocephalic and atraumatic.    Eyes: Conjunctivae and EOM are normal. No scleral icterus.  Neck: Normal range of motion.  Cardiovascular: Normal rate, regular rhythm and intact distal pulses.   Pulmonary/Chest: Effort normal. No respiratory distress. She has no wheezes. She has no rales.  Respirations even and unlabored  Abdominal: Soft. She exhibits no distension and no mass. There is tenderness.  Soft, obese abdomen. There is tenderness to palpation in the suprapubic abdomen as well as the bilateral lower quadrants. No masses or involuntary guarding. No peritoneal signs.  Genitourinary:  Genitourinary Comments: No hemorrhoids or anal fissure noted on DRE. No melena or gross blood. Normal rectal tone.  Musculoskeletal: Normal range of motion.  Neurological: She is alert and oriented to person, place, and time. She displays normal reflexes. Coordination normal.  GCS 15. Patient moving all extremities.  Skin: Skin is warm and dry. No rash noted. She is not diaphoretic. No erythema. No pallor.  Psychiatric: She has a normal mood and affect. Her behavior is normal.  Nursing note and vitals reviewed.    ED Treatments / Results  Labs (all labs ordered are listed, but only abnormal results are displayed) Labs Reviewed  COMPREHENSIVE METABOLIC PANEL - Abnormal; Notable for the following:       Result Value   Potassium 3.3 (*)    Glucose, Bld 106 (*)    ALT 64 (*)    All other components within normal limits  CBC - Abnormal; Notable for the following:    RBC 5.35 (*)    MCH 25.8 (*)    All other components within normal limits  URINALYSIS, ROUTINE W REFLEX MICROSCOPIC (NOT AT [facility]) - Abnormal; Notable for the following:    APPearance CLOUDY (*)    pH 8.5 (*)    Leukocytes, UA TRACE (*)    All other components within normal limits  URINE MICROSCOPIC-ADD ON - Abnormal; Notable for the following:    Squamous Epithelial / LPF 6-30 (*)    Bacteria, UA MANY (*)    Casts HYALINE CASTS (*)    All other components within  normal limits  LIPASE, BLOOD  POC OCCULT BLOOD, ED  SAMPLE TO BLOOD BANK    EKG  EKG Interpretation None       Radiology Ct Abdomen Pelvis W Contrast  Result Date: 06/12/2016 CLINICAL DATA:  60 y/o F; abdominal pain with nausea and rectal bleeding. Patient had a colonoscopy on Friday. EXAM: CT ABDOMEN AND PELVIS WITH CONTRAST TECHNIQUE: Multidetector CT imaging of the abdomen and pelvis was performed using the standard protocol following bolus administration of intravenous contrast.  CONTRAST:  1 ISOVUE-300 IOPAMIDOL (ISOVUE-300) INJECTION 61% COMPARISON:  CT of abdomen and pelvis dated 01/19/2015. FINDINGS: Lower chest:  No acute findings. Hepatobiliary: There has been a marked interval increase in intrahepatic biliary ductal dilatation, common bile duct dilatation, and pancreatic ductal dilatation. No mass of the liver parenchyma is identified. Postcholecystectomy. Pancreas: No inflammatory changes. There is a 12 mm nodular structure within the duodenum at the pancreatic ampulla which may represent a mass (series 7, image 14). Spleen: Within normal limits in size and appearance. Adrenals/Urinary Tract: No hydronephrosis. No kidney mass. 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 Stomach/Bowel: No evidence of obstruction, inflammatory process, or abnormal fluid collections. Mild sigmoid diverticulosis without evidence for diverticulitis. No evidence for colonic perforation. Vascular/Lymphatic: No pathologically enlarged lymph nodes. No evidence of abdominal aortic aneurysm. Reproductive: No mass or other significant abnormality. Other: Multiple calcifications within the subcutaneous fat of the flanks bilaterally probably represent injection granuloma or possibly sequelae of old trauma. Musculoskeletal:  No suspicious bone lesions identified. IMPRESSION: 1. No evidence for colonic perforation or inflammatory changes of the bowel. Mild sigmoid diverticulosis without diverticulitis. 2. Marked interval  dilatation of the pancreatic duct and the biliary system with a possible mass at the pancreatic ampulla. Correlation with ERCP and/or MRCP is recommended. These results will be called to the ordering clinician or representative by the Radiologist Assistant, and communication documented in the PACS or zVision Dashboard. Electronically Signed   By: Kristine Garbe M.D.   On: 06/12/2016 00:05    Procedures Procedures (including critical care time)  Medications Ordered in ED Medications  sodium chloride 0.9 % bolus 1,000 mL (0 mLs Intravenous Stopped 06/11/16 2353)  ondansetron (ZOFRAN) injection 4 mg (4 mg Intravenous Given 06/11/16 2123)  dicyclomine (BENTYL) injection 20 mg (20 mg Intramuscular Given 06/11/16 2127)  fentaNYL (SUBLIMAZE) injection 50 mcg (50 mcg Intravenous Given 06/11/16 2123)  morphine 4 MG/ML injection 4 mg (4 mg Intravenous Given 06/11/16 2250)  iopamidol (ISOVUE-300) 61 % injection (100 mLs  Contrast Given 06/11/16 2318)  morphine 4 MG/ML injection 4 mg (4 mg Intravenous Given 06/11/16 2349)     Initial Impression / Assessment and Plan / ED Course  I have reviewed the triage vital signs and the nursing notes.  Pertinent labs & imaging results that were available during my care of the patient were reviewed by me and considered in my medical decision making (see chart for details).  Clinical Course    60 year old female presents to the emergency department for evaluation of lower abdominal pain 4 days post colonoscopy. Abdominal cramping began after passing a bowel movement of stool mixed with mucus and blood. Patient with a reassuring laboratory workup. She is Hemoccult negative today. Patient also afebrile. Pain managed with morphine as well as Bentyl. Patient has had no emesis despite complaints of nausea.  CT abdomen pelvis obtained given history of recent instrumentation. This is negative for acute or emergent process. CT does show findings concerning for possible  mass in the pancreatic ampule. LFTs are reassuring, however, and patient with a normal lipase. I believe this is appropriate for outpatient follow-up with the patient's gastroenterologist, Dr. Penelope Coop. Plan to discharge with prescription for pain medicine as well as nausea medication. Return precautions provided at discharge. Patient discharged in satisfactory condition.   Final Clinical Impressions(s) / ED Diagnoses   Final diagnoses:  Lower abdominal pain    Vitals:   06/11/16 2019 06/11/16 2205 06/12/16 0033 06/12/16 0040  BP: (!) 143/122 127/73  122/70  Pulse: 102 72 85 83  Resp: 22  18 16   Temp:      TempSrc:      SpO2: 99% 99% 98% 98%    New Prescriptions New Prescriptions   PROMETHAZINE (PHENERGAN) 25 MG TABLET    Take 1 tablet (25 mg total) by mouth every 6 (six) hours as needed for nausea or vomiting.     Antonietta Breach, PA-C 06/12/16 [place], MD 06/12/16 [phone]

## 2016-06-11 NOTE — ED Notes (Signed)
Pt wheeled back from triage, taken straight to restroom per request. UA collected, pt brought back to room, hooked up to bp cuff and pulse ox and given yellow fall socks per pt request.

## 2016-06-12 LAB — SAMPLE TO BLOOD BANK

## 2016-06-12 MED ORDER — PROMETHAZINE HCL 25 MG PO TABS: 25.0000 mg | ORAL_TABLET | Freq: Four times a day (QID) | ORAL | 0 refills | Status: DC | PRN

## 2016-06-12 MED ORDER — HYDROCODONE-ACETAMINOPHEN 5-325 MG PO TABS: 1.0000 | ORAL_TABLET | Freq: Four times a day (QID) | ORAL | 0 refills | Status: DC | PRN

## 2016-06-12 MED ORDER — DICYCLOMINE HCL 20 MG PO TABS: 20.0000 mg | ORAL_TABLET | Freq: Three times a day (TID) | ORAL | 0 refills | Status: DC

## 2016-06-12 NOTE — Discharge Instructions (Signed)
Your CT today was reassuring. It did show a questionable pancreatic mass. This should be further evaluated by your gastroenterologist. Your laboratory testing was reassuring as well. Continue take Bentyl as needed for abdominal cramping. You may take Norco as needed for severe pain. You may take Phenergan as needed for nausea/vomiting. Follow-up with your primary care doctor for any persistent symptoms.

## 2016-06-17 DIAGNOSIS — R938 Abnormal findings on diagnostic imaging of other specified body structures: Secondary | ICD-10-CM | POA: Diagnosis not present

## 2016-06-20 ENCOUNTER — Encounter (HOSPITAL_COMMUNITY): Payer: Self-pay | Admitting: *Deleted

## 2016-06-21 ENCOUNTER — Ambulatory Visit (HOSPITAL_COMMUNITY): Payer: Medicare Other | Admitting: Certified Registered Nurse Anesthetist

## 2016-06-21 ENCOUNTER — Encounter (HOSPITAL_COMMUNITY)
Admission: RE | Disposition: A | Discharge status: Home / Self Care | Payer: Self-pay | Source: Ambulatory Visit | Attending: Gastroenterology

## 2016-06-21 ENCOUNTER — Other Ambulatory Visit: Payer: Self-pay | Admitting: Gastroenterology

## 2016-06-21 ENCOUNTER — Encounter (HOSPITAL_COMMUNITY): Payer: Self-pay

## 2016-06-21 ENCOUNTER — Ambulatory Visit (HOSPITAL_COMMUNITY)
Admission: RE | Admit: 2016-06-21 | Discharge: 2016-06-21 | Disposition: A | Discharge status: Home / Self Care | Payer: Medicare Other | Source: Ambulatory Visit | Attending: Gastroenterology | Admitting: Gastroenterology

## 2016-06-21 DIAGNOSIS — Z9049 Acquired absence of other specified parts of digestive tract: Secondary | ICD-10-CM | POA: Insufficient documentation

## 2016-06-21 DIAGNOSIS — F419 Anxiety disorder, unspecified: Secondary | ICD-10-CM | POA: Insufficient documentation

## 2016-06-21 DIAGNOSIS — Z79899 Other long term (current) drug therapy: Secondary | ICD-10-CM | POA: Insufficient documentation

## 2016-06-21 DIAGNOSIS — G40909 Epilepsy, unspecified, not intractable, without status epilepticus: Secondary | ICD-10-CM | POA: Diagnosis not present

## 2016-06-21 DIAGNOSIS — I10 Essential (primary) hypertension: Secondary | ICD-10-CM | POA: Insufficient documentation

## 2016-06-21 DIAGNOSIS — K838 Other specified diseases of biliary tract: Secondary | ICD-10-CM | POA: Diagnosis not present

## 2016-06-21 DIAGNOSIS — K8689 Other specified diseases of pancreas: Secondary | ICD-10-CM | POA: Diagnosis not present

## 2016-06-21 DIAGNOSIS — K831 Obstruction of bile duct: Secondary | ICD-10-CM | POA: Diagnosis not present

## 2016-06-21 DIAGNOSIS — R935 Abnormal findings on diagnostic imaging of other abdominal regions, including retroperitoneum: Secondary | ICD-10-CM | POA: Diagnosis not present

## 2016-06-21 DIAGNOSIS — R932 Abnormal findings on diagnostic imaging of liver and biliary tract: Secondary | ICD-10-CM | POA: Diagnosis not present

## 2016-06-21 HISTORY — PX: EUS: SHX5427

## 2016-06-21 SURGERY — ESOPHAGEAL ENDOSCOPIC ULTRASOUND (EUS) RADIAL
Anesthesia: Monitor Anesthesia Care

## 2016-06-21 MED ORDER — LIDOCAINE 2% (20 MG/ML) 5 ML SYRINGE
INTRAMUSCULAR | Status: AC
Start: 2016-06-21 — End: 2016-06-21
  Filled 2016-06-21: qty 5

## 2016-06-21 MED ORDER — PROPOFOL 10 MG/ML IV BOLUS
INTRAVENOUS | Status: AC
  Filled 2016-06-21: qty 20

## 2016-06-21 MED ORDER — LIDOCAINE 2% (20 MG/ML) 5 ML SYRINGE
INTRAMUSCULAR | Status: DC | PRN
  Administered 2016-06-21: 50 mg via INTRAVENOUS

## 2016-06-21 MED ORDER — PROPOFOL 500 MG/50ML IV EMUL
INTRAVENOUS | Status: DC | PRN
  Administered 2016-06-21: 200 ug/kg/min via INTRAVENOUS

## 2016-06-21 MED ORDER — LACTATED RINGERS IV SOLN
INTRAVENOUS | Status: DC
  Administered 2016-06-21: 13:00:00 via INTRAVENOUS

## 2016-06-21 MED ORDER — PROPOFOL 10 MG/ML IV BOLUS
INTRAVENOUS | Status: DC | PRN
  Administered 2016-06-21: 50 mg via INTRAVENOUS

## 2016-06-21 NOTE — Discharge Instructions (Signed)
Endoscopic Ultrasound ° °Care After °Please read the instructions outlined below and refer to this sheet in the next few weeks. These discharge instructions provide you with general information on caring for yourself after you leave the hospital. Your doctor may also give you specific instructions. While your treatment has been planned according to the most current medical practices available, unavoidable complications occasionally occur. If you have any problems or questions after discharge, please call Dr. Tremayne Sheldon ([facility]) at [phone]. ° °HOME CARE INSTRUCTIONS °Activity °· You may resume your regular activity but move at a slower pace for the next 24 hours.  °· Take frequent rest periods for the next 24 hours.  °· Walking will help expel (get rid of) the air and reduce the bloated feeling in your abdomen.  °· No driving for 24 hours (because of the anesthesia (medicine) used during the test).  °· You may shower.  °· Do not sign any important legal documents or operate any machinery for 24 hours (because of the anesthesia used during the test).  °Nutrition °· Drink plenty of fluids.  °· You may resume your normal diet.  °· Begin with a light meal and progress to your normal diet.  °· Avoid alcoholic beverages for 24 hours or as instructed by your caregiver.  °Medications °You may resume your normal medications unless your caregiver tells you otherwise. °What you can expect today °· You may experience abdominal discomfort such as a feeling of fullness or "gas" pains.  °· You may experience a sore throat for 2 to 3 days. This is normal. Gargling with salt water may help this.  °·  °SEEK IMMEDIATE MEDICAL CARE IF: °· You have excessive nausea (feeling sick to your stomach) and/or vomiting.  °· You have severe abdominal pain and distention (swelling).  °· You have trouble swallowing.  °· You have a temperature over 100° F (37.8° C).  °· You have rectal bleeding or vomiting of blood.  °Document  Released: 05/21/2004 Document Revised: 06/19/2011 Document Reviewed: 12/02/2007 °Exit[employer]® Patient Information ©2012 [employer]. °

## 2016-06-21 NOTE — Anesthesia Preprocedure Evaluation (Signed)
Anesthesia Evaluation  Patient identified by MRN, date of birth, ID band Patient awake    Reviewed: Allergy & Precautions, H&P , NPO status , Patient's Chart, lab work & pertinent test results  Airway Mallampati: II  TM Distance: >3 FB Neck ROM: Full    Dental no notable dental hx.    Pulmonary neg pulmonary ROS,    Pulmonary exam normal breath sounds clear to auscultation       Cardiovascular hypertension, Pt. on medications Normal cardiovascular exam Rhythm:Regular Rate:Normal     Neuro/Psych Seizures -, Well Controlled,  negative psych ROS   GI/Hepatic (+) Hepatitis -, CUC   Endo/Other  negative endocrine ROS  Renal/GU negative Renal ROS  negative genitourinary   Musculoskeletal SLE   Abdominal   Peds negative pediatric ROS (+)  Hematology negative hematology ROS (+)   Anesthesia Other Findings   Reproductive/Obstetrics negative OB ROS                             Anesthesia Physical  Anesthesia Plan  ASA: II  Anesthesia Plan: MAC   Post-op Pain Management:    Induction: Intravenous  Airway Management Planned: Mask and Natural Airway  Additional Equipment:   Intra-op Plan:   Post-operative Plan:   Informed Consent: I have reviewed the patients History and Physical, chart, labs and discussed the procedure including the risks, benefits and alternatives for the proposed anesthesia with the patient or authorized representative who has indicated his/her understanding and acceptance.   Dental Advisory Given  Plan Discussed with: CRNA and Surgeon  Anesthesia Plan Comments: (Discussed sedation and potential to need to place airway or ETT if warranted by clinical changes intra-operatively. We will start procedure as MAC.)        Anesthesia Quick Evaluation

## 2016-06-21 NOTE — H&P (Signed)
Patient interval history reviewed.  Patient examined again.  There has been no change from documented H/P dated 06/17/16 (scanned into chart from our office) except as documented above.  Assessment:  1.  Possible ampullary lesion on CT scan. 2.  Biliary and pancreatic ductal dilatation; normal LFTs.  Plan:  1.  Endoscopic ultrasound with possible biopsies. 2.  Risks (bleeding, infection, bowel perforation that could require surgery, sedation-related changes in cardiopulmonary systems), benefits (identification and possible treatment of source of symptoms, exclusion of certain causes of symptoms), and alternatives (watchful waiting, radiographic imaging studies, empiric medical treatment) of upper endoscopy with ultrasound and possible biopsies (EUS +/- FNA) were explained to patient/family in detail and patient wishes to proceed.

## 2016-06-21 NOTE — Op Note (Signed)
[facility] Patient Name: Erin Wolf Procedure Date: 06/21/2016 MRN: [id number] Attending MD: Arta Silence , MD Date of Birth: [birth year] CSN: [id number] Age: 60 Admit Type: Inpatient Procedure:                Upper EUS Indications:              Common bile duct dilation (etiology unknown) seen                            on CT scan, Dilated pancreatic duct on CT scan,                            Abnormal abdominal/pelvic CT scan, recent LFTs                            normal Providers:                Arta Silence, MD, Laverta Baltimore RN, RN,                            Jaydian Santana Dalton, Technician Referring MD:             Anson Fret, MD Medicines:                Monitored Anesthesia Care Complications:            No immediate complications. Estimated Blood Loss:     Estimated blood loss: none. Procedure:                Pre-Anesthesia Assessment:                           - Prior to the procedure, a History and Physical                            was performed, and patient medications and                            allergies were reviewed. The patient's tolerance of                            previous anesthesia was also reviewed. The risks                            and benefits of the procedure and the sedation                            options and risks were discussed with the patient.                            All questions were answered, and informed consent                            was obtained. Prior Anticoagulants: The patient has  taken no previous anticoagulant or antiplatelet                            agents. ASA Grade Assessment: II - A patient with                            mild systemic disease. After reviewing the risks                            and benefits, the patient was deemed in                            satisfactory condition to undergo the procedure.                           After obtaining informed  consent, the endoscope was                            passed under direct vision. Throughout the                            procedure, the patient's blood pressure, pulse, and                            oxygen saturations were monitored continuously. The                            [id number]) scope was introduced through                            the mouth, and advanced to the second part of                            duodenum. The Endoscope was introduced through the                            mouth, and advanced to the second part of duodenum.                            The upper EUS was accomplished without difficulty.                            The patient tolerated the procedure well. Scope In: Scope Out: Findings:      Endoscopic Finding :      The entire examined stomach was normal.      The duodenal bulb, first portion of the duodenum and second portion of       the duodenum were normal.      Endosonographic Finding :      Patient's ampulla was prominent endoscopically, but no mass was seen via       EUS despite extensive evaluation both with radial and linear EUS scopes.       There was no sign of significant endosonographic abnormality in the       ampulla.  There was dilation in the common bile duct which measured up to 8 mm.      There was no sign of significant endosonographic abnormality in the       pancreatic head or uncinate pancreas The pancreatic duct measured up to       5 mm in diameter.      There was no evidence of abrupt termination of either the pancreatic or       bile ducts, which would typically be expected with a mass; instead,       there was gradual tapering of the bile and pancreatic duct to the level       of the ampulla. Evidence of a previous cholecystectomy was identified       endosonographically.      There was no sign of significant endosonographic abnormality in the left       lobe of the liver. Impression:               - Normal  stomach.                           - Normal duodenal bulb, first portion of the                            duodenum and second portion of the duodenum.                           - Prominent ampulla, but no evidence of ampullary                            mass.                           - There was dilation in the common bile duct which                            measured up to 8 mm.                           - There was no sign of significant pathology in the                            pancreatic head.                           - Evidence of a cholecystectomy.                           - There was no evidence of significant pathology in                            the left lobe of the liver. Moderate Sedation:      None Recommendation:           - Discharge patient to home (via wheelchair).                           - Resume previous diet today.                           -  Continue present medications.                           - Return to GI clinic in 6 weeks.                           - Return to referring physician as previously                            scheduled. Procedure Code(s):        --- Professional ---                           [phone], Esophagogastroduodenoscopy, flexible,                            transoral; with endoscopic ultrasound examination,                            including the esophagus, stomach, and either the                            duodenum or a surgically altered stomach where the                            jejunum is examined distal to the anastomosis Diagnosis Code(s):        --- Professional ---                           Z90.49, Acquired absence of other specified parts                            of digestive tract                           K86.89, Other specified diseases of pancreas                           K83.8, Other specified diseases of biliary tract                           R93.5, Abnormal findings on diagnostic imaging of                             other abdominal regions, including retroperitoneum                           R93.2, Abnormal findings on diagnostic imaging of                            liver and biliary tract CPT copyright 2016 [employer]. All rights reserved. The codes documented in this report are preliminary and upon coder review may  be revised to meet current compliance requirements. Arta Silence, MD 06/21/2016 3:21:53 PM This report has been signed electronically. Number of Addenda: 0

## 2016-06-21 NOTE — Transfer of Care (Signed)
Immediate Anesthesia Transfer of Care Note  Patient: Erin Wolf  Procedure(s) Performed: Procedure(s): ESOPHAGEAL ENDOSCOPIC ULTRASOUND (EUS) RADIAL (N/A)  Patient Location: PACU  Anesthesia Type:MAC  Level of Consciousness:  sedated, patient cooperative and responds to stimulation  Airway & Oxygen Therapy:Patient Spontanous Breathing   Post-op Assessment:  Report given to PACU RN and Post -op Vital signs reviewed and stable  Post vital signs:  Reviewed and stable  Last Vitals:  Vitals:   06/21/16 1307 06/21/16 1515  BP: 125/82 106/70  Pulse: 68 71  Resp: 12 16  Temp: 36.7 C [id number] C    Complications: No apparent anesthesia complications

## 2016-06-24 NOTE — Anesthesia Postprocedure Evaluation (Signed)
Anesthesia Post Note  Patient: Erin Wolf  Procedure(s) Performed: Procedure(s) (LRB): ESOPHAGEAL ENDOSCOPIC ULTRASOUND (EUS) RADIAL (N/A)  Patient location during evaluation: PACU Anesthesia Type: MAC Level of consciousness: awake and alert Pain management: pain level controlled Vital Signs Assessment: post-procedure vital signs reviewed and stable Respiratory status: spontaneous breathing, nonlabored ventilation, respiratory function stable and patient connected to nasal cannula oxygen Cardiovascular status: stable and blood pressure returned to baseline Anesthetic complications: no    Last Vitals:  Vitals:   06/21/16 1520 06/21/16 1540  BP: 118/73 118/64  Pulse:    Resp:    Temp:      Last Pain:  Vitals:   06/21/16 1515  TempSrc: Oral                 Alba Kriesel EDWARD

## 2016-06-25 ENCOUNTER — Encounter (HOSPITAL_COMMUNITY): Payer: Self-pay | Admitting: Gastroenterology

## 2016-06-29 ENCOUNTER — Encounter (HOSPITAL_COMMUNITY): Payer: Self-pay | Admitting: Emergency Medicine

## 2016-06-29 ENCOUNTER — Observation Stay (HOSPITAL_COMMUNITY): Payer: Medicare Other

## 2016-06-29 ENCOUNTER — Emergency Department (HOSPITAL_COMMUNITY): Payer: Medicare Other

## 2016-06-29 ENCOUNTER — Inpatient Hospital Stay (HOSPITAL_COMMUNITY)
Admission: EM | Admit: 2016-06-29 | Discharge: 2016-07-01 | DRG: 312 | Disposition: A | Discharge status: Home Health | Payer: Medicare Other | Attending: Internal Medicine | Admitting: Internal Medicine

## 2016-06-29 DIAGNOSIS — G514 Facial myokymia: Secondary | ICD-10-CM | POA: Insufficient documentation

## 2016-06-29 DIAGNOSIS — M545 Low back pain: Secondary | ICD-10-CM | POA: Diagnosis not present

## 2016-06-29 DIAGNOSIS — M6289 Other specified disorders of muscle: Secondary | ICD-10-CM

## 2016-06-29 DIAGNOSIS — G40409 Other generalized epilepsy and epileptic syndromes, not intractable, without status epilepticus: Secondary | ICD-10-CM | POA: Diagnosis present

## 2016-06-29 DIAGNOSIS — R531 Weakness: Secondary | ICD-10-CM | POA: Diagnosis present

## 2016-06-29 DIAGNOSIS — F329 Major depressive disorder, single episode, unspecified: Secondary | ICD-10-CM | POA: Diagnosis not present

## 2016-06-29 DIAGNOSIS — M79661 Pain in right lower leg: Secondary | ICD-10-CM

## 2016-06-29 DIAGNOSIS — M549 Dorsalgia, unspecified: Secondary | ICD-10-CM

## 2016-06-29 DIAGNOSIS — S0083XA Contusion of other part of head, initial encounter: Secondary | ICD-10-CM

## 2016-06-29 DIAGNOSIS — G8194 Hemiplegia, unspecified affecting left nondominant side: Secondary | ICD-10-CM | POA: Diagnosis present

## 2016-06-29 DIAGNOSIS — S0003XA Contusion of scalp, initial encounter: Secondary | ICD-10-CM | POA: Diagnosis present

## 2016-06-29 DIAGNOSIS — K589 Irritable bowel syndrome without diarrhea: Secondary | ICD-10-CM

## 2016-06-29 DIAGNOSIS — W19XXXA Unspecified fall, initial encounter: Secondary | ICD-10-CM | POA: Diagnosis present

## 2016-06-29 DIAGNOSIS — R079 Chest pain, unspecified: Secondary | ICD-10-CM | POA: Diagnosis not present

## 2016-06-29 DIAGNOSIS — R55 Syncope and collapse: Secondary | ICD-10-CM | POA: Diagnosis not present

## 2016-06-29 DIAGNOSIS — E785 Hyperlipidemia, unspecified: Secondary | ICD-10-CM | POA: Diagnosis not present

## 2016-06-29 DIAGNOSIS — R569 Unspecified convulsions: Secondary | ICD-10-CM

## 2016-06-29 DIAGNOSIS — Z79899 Other long term (current) drug therapy: Secondary | ICD-10-CM

## 2016-06-29 DIAGNOSIS — F32A Depression, unspecified: Secondary | ICD-10-CM | POA: Diagnosis present

## 2016-06-29 DIAGNOSIS — E7439 Other disorders of intestinal carbohydrate absorption: Secondary | ICD-10-CM | POA: Diagnosis present

## 2016-06-29 DIAGNOSIS — R072 Precordial pain: Secondary | ICD-10-CM | POA: Diagnosis present

## 2016-06-29 DIAGNOSIS — I10 Essential (primary) hypertension: Secondary | ICD-10-CM | POA: Diagnosis not present

## 2016-06-29 DIAGNOSIS — G8929 Other chronic pain: Secondary | ICD-10-CM | POA: Diagnosis present

## 2016-06-29 DIAGNOSIS — F419 Anxiety disorder, unspecified: Secondary | ICD-10-CM | POA: Diagnosis present

## 2016-06-29 DIAGNOSIS — K5190 Ulcerative colitis, unspecified, without complications: Secondary | ICD-10-CM | POA: Diagnosis present

## 2016-06-29 LAB — BASIC METABOLIC PANEL
Anion gap: 10 (ref 5–15)
BUN: 9 mg/dL (ref 6–20)
CALCIUM: 9.8 mg/dL (ref 8.9–10.3)
CO2: 21 mmol/L — ABNORMAL LOW (ref 22–32)
CREATININE: 0.62 mg/dL (ref 0.44–1.00)
Chloride: 104 mmol/L (ref 101–111)
Glucose, Bld: 104 mg/dL — ABNORMAL HIGH (ref 65–99)
Potassium: 3.7 mmol/L (ref 3.5–5.1)
SODIUM: 135 mmol/L (ref 135–145)

## 2016-06-29 LAB — URINALYSIS, ROUTINE W REFLEX MICROSCOPIC
BILIRUBIN URINE: NEGATIVE
Glucose, UA: NEGATIVE mg/dL
Hgb urine dipstick: NEGATIVE
KETONES UR: NEGATIVE mg/dL
LEUKOCYTES UA: NEGATIVE
NITRITE: NEGATIVE
PH: 7 (ref 5.0–8.0)
PROTEIN: NEGATIVE mg/dL
Specific Gravity, Urine: 1.014 (ref 1.005–1.030)

## 2016-06-29 LAB — CBC
HCT: 45.2 % (ref 36.0–46.0)
Hemoglobin: 14.4 g/dL (ref 12.0–15.0)
MCH: 26 pg (ref 26.0–34.0)
MCHC: 31.9 g/dL (ref 30.0–36.0)
MCV: 81.7 fL (ref 78.0–100.0)
PLATELETS: 346 10*3/uL (ref 150–400)
RBC: 5.53 MIL/uL — AB (ref 3.87–5.11)
RDW: 15.4 % (ref 11.5–15.5)
WBC: 5.4 10*3/uL (ref 4.0–10.5)

## 2016-06-29 LAB — CBG MONITORING, ED: Glucose-Capillary: 105 mg/dL — ABNORMAL HIGH (ref 65–99)

## 2016-06-29 MED ORDER — MORPHINE SULFATE (PF) 4 MG/ML IV SOLN
4.0000 mg | Freq: Once | INTRAVENOUS | Status: AC
  Administered 2016-06-29: 4 mg via INTRAVENOUS
  Filled 2016-06-29: qty 1

## 2016-06-29 MED ORDER — MORPHINE SULFATE (PF) 2 MG/ML IV SOLN
2.0000 mg | INTRAVENOUS | Status: DC | PRN
  Administered 2016-06-29 – 2016-06-30 (×2): 2 mg via INTRAVENOUS
  Filled 2016-06-29 (×2): qty 1

## 2016-06-29 MED ORDER — FENTANYL CITRATE (PF) 100 MCG/2ML IJ SOLN: 25.0000 ug | INTRAMUSCULAR | Status: DC | PRN

## 2016-06-29 MED ORDER — DIAZEPAM 5 MG PO TABS: 5.0000 mg | ORAL_TABLET | Freq: Every day | ORAL | Status: DC | PRN

## 2016-06-29 MED ORDER — MESALAMINE 4 G RE ENEM
4.0000 g | ENEMA | Freq: Every evening | RECTAL | Status: DC | PRN
  Filled 2016-06-29: qty 60

## 2016-06-29 MED ORDER — CYCLOBENZAPRINE HCL 10 MG PO TABS: 5.0000 mg | ORAL_TABLET | Freq: Three times a day (TID) | ORAL | Status: DC | PRN

## 2016-06-29 MED ORDER — SODIUM CHLORIDE 0.9 % IV SOLN
INTRAVENOUS | Status: DC
  Administered 2016-06-29: via INTRAVENOUS

## 2016-06-29 MED ORDER — ENOXAPARIN SODIUM 40 MG/0.4ML ~~LOC~~ SOLN
40.0000 mg | SUBCUTANEOUS | Status: DC
  Administered 2016-06-29 – 2016-06-30 (×2): 40 mg via SUBCUTANEOUS
  Filled 2016-06-29 (×3): qty 0.4

## 2016-06-29 MED ORDER — SODIUM CHLORIDE 0.9 % IV BOLUS (SEPSIS)
1000.0000 mL | Freq: Once | INTRAVENOUS | Status: AC
  Administered 2016-06-29: 1000 mL via INTRAVENOUS

## 2016-06-29 MED ORDER — SODIUM CHLORIDE 0.9% FLUSH
3.0000 mL | Freq: Two times a day (BID) | INTRAVENOUS | Status: DC
  Administered 2016-06-29 – 2016-07-01 (×2): 3 mL via INTRAVENOUS

## 2016-06-29 MED ORDER — NITROGLYCERIN 0.4 MG SL SUBL: 0.4000 mg | SUBLINGUAL_TABLET | SUBLINGUAL | Status: DC | PRN

## 2016-06-29 MED ORDER — SODIUM CHLORIDE 0.9 % IV SOLN
INTRAVENOUS | Status: DC
  Administered 2016-06-30: 08:00:00 via INTRAVENOUS

## 2016-06-29 MED ORDER — POLYVINYL ALCOHOL 1.4 % OP SOLN
Freq: Two times a day (BID) | OPHTHALMIC | Status: DC | PRN
  Filled 2016-06-29: qty 15

## 2016-06-29 MED ORDER — LORAZEPAM 1 MG PO TABS
1.0000 mg | ORAL_TABLET | Freq: Three times a day (TID) | ORAL | Status: DC | PRN
  Administered 2016-06-29 – 2016-06-30 (×2): 1 mg via ORAL
  Filled 2016-06-29 (×2): qty 1

## 2016-06-29 MED ORDER — OXYCODONE HCL 5 MG PO TABS
10.0000 mg | ORAL_TABLET | Freq: Three times a day (TID) | ORAL | Status: DC | PRN
  Administered 2016-06-29 – 2016-07-01 (×4): 10 mg via ORAL
  Filled 2016-06-29 (×5): qty 2

## 2016-06-29 MED ORDER — ONDANSETRON HCL 4 MG/2ML IJ SOLN
4.0000 mg | Freq: Three times a day (TID) | INTRAMUSCULAR | Status: DC | PRN
  Administered 2016-06-29 – 2016-07-01 (×4): 4 mg via INTRAVENOUS
  Filled 2016-06-29 (×4): qty 2

## 2016-06-29 MED ORDER — ADULT MULTIVITAMIN W/MINERALS CH
ORAL_TABLET | Freq: Every morning | ORAL | Status: DC
  Administered 2016-06-30 – 2016-07-01 (×2): 1 via ORAL
  Filled 2016-06-29 (×2): qty 1

## 2016-06-29 MED ORDER — ONDANSETRON HCL 4 MG/2ML IJ SOLN
4.0000 mg | Freq: Once | INTRAMUSCULAR | Status: AC
  Administered 2016-06-29: 4 mg via INTRAVENOUS
  Filled 2016-06-29: qty 2

## 2016-06-29 MED ORDER — FLUOXETINE HCL 20 MG PO CAPS
20.0000 mg | ORAL_CAPSULE | Freq: Every morning | ORAL | Status: DC
  Administered 2016-06-30 – 2016-07-01 (×2): 20 mg via ORAL
  Filled 2016-06-29 (×2): qty 1

## 2016-06-29 MED ORDER — LEVETIRACETAM ER 500 MG PO TB24
500.0000 mg | ORAL_TABLET | Freq: Two times a day (BID) | ORAL | Status: DC
  Administered 2016-06-29 – 2016-06-30 (×2): 500 mg via ORAL
  Filled 2016-06-29 (×3): qty 1

## 2016-06-29 MED ORDER — DICYCLOMINE HCL 20 MG PO TABS
20.0000 mg | ORAL_TABLET | Freq: Three times a day (TID) | ORAL | Status: DC
  Administered 2016-06-29 – 2016-07-01 (×7): 20 mg via ORAL
  Filled 2016-06-29 (×9): qty 1

## 2016-06-29 MED ORDER — HYDRALAZINE HCL 20 MG/ML IJ SOLN: 5.0000 mg | INTRAMUSCULAR | Status: DC | PRN

## 2016-06-29 NOTE — ED Triage Notes (Signed)
Tated, I passed out for 15 minutes and didn't know it.  I just feel bad.  When I ask her name she gave it. When I ask her today's date she said, "I don't know and don't care."

## 2016-06-29 NOTE — ED Notes (Signed)
While doing orthostatic BP, pt passed out when she was sitting on side of bed. Dr Ashok Cordia made aware and at bedside.

## 2016-06-29 NOTE — H&P (Addendum)
History and Physical    Erin Wolf [id number] DOB: 28-Apr-1956 DOA: 06/29/2016  Referring MD/NP/PA:   PCP: Shirline Frees, MD   Patient coming from:  The patient is coming from home.  At baseline, pt is independent for most of ADL.   Chief Complaint: Syncope, chest pain, left-sided weakness, facial twitching  HPI: DIANIA REASON is a 60 y.o. female with medical history significant of hypertension, hyperlipidemia, GERD, depression, anxiety, ulcerative colitis, SLE, seizure, IBS, HCV, back pain, chronic abdominal pain, who presents with syncope, chest pain, left-sided weakness, facial twitching.  Patient states that she passed out at about 7 PM. Patient states that it probably happened in the bathroom or kitchen. She states that she likely passed out for 15 min as her daughter told her. She report generalized weak for about 1 week. She also has mild left-sided weakness. She states that she drops things in left hand.  She states that she has facial twitching about her left eye today. Pt had another episode of syncope in ED, no seizure activity noted per EDP.  She also reports chest pain since this morning. The chest pain is located in front chest, pressure-like, 6 out of 10 severity. It is associated with shortness of breath. The chest pain is getting worse when taking a deep breath. She has shortness of breath, but no cough. Has chills, but no fever. She also has right calf cramping. She states that she has chronic mild abdominal pain and diarrhea due to IBS, which is close to the baseline. She has nausea, and vomited twice today, without blood in the vomitus. No hematemesis or hematochezia. She denies symptoms of UTI. No vision change or hearing loss.  ED Course: pt was found to have  WBC 5.4, electrolytes and renal function okay, CT head and CT C-spine is negative for acute abnormalities, negative urinalysis, temperature normal, transient tachycardia, transient tachypnea, oxygen  saturation 97% on room air, blood pressure normal. Patient is placed on telemetry bed for observation.   Review of Systems:   General: no fevers, chills, no changes in body weight, has poor appetite, has fatigue HEENT: no blurry vision, hearing changes or sore throat Respiratory: has dyspnea, no coughing, wheezing CV: has chest pain, no palpitations GI: has nausea, vomiting, abdominal pain, diarrhea, no constipation GU: no dysuria, burning on urination, increased urinary frequency, hematuria  Ext: no leg edema Neuro: no unilateral weakness, numbness, or tingling, no vision change or hearing loss Skin: no rash, has bruises over left frontal area. MSK: no deformity, no limitation of range of movement in spin Heme: No easy bruising.  Travel history: No recent long distant travel.  Allergy:  Allergies  Allergen Reactions  . Aspirin Nausea Only  . Darvocet [Propoxyphene N-Acetaminophen] Nausea And Vomiting  . Isometheptene-Dichloral-Apap Nausea Only  . Latex Other (See Comments)    Peels skin  . Nsaids Other (See Comments)    HAS COLITIS FLARES  . Penicillins Hives    Has patient had a PCN reaction causing immediate rash, facial/tongue/throat swelling, SOB or lightheadedness with hypotension: Yes Has patient had a PCN reaction causing severe rash involving mucus membranes or skin necrosis: Unknown Has patient had a PCN reaction that required hospitalization: Unknown Has patient had a PCN reaction occurring within the last 10 years: No If all of the above answers are "NO", then may proceed with Cephalosporin use.   . Tape Hives  . Toradol [Ketorolac Tromethamine] Nausea And Vomiting  . Tylenol [Acetaminophen] Nausea Only  .  Wellbutrin [Bupropion Hcl] Other (See Comments)    Insomnia and headaches  . Dilaudid [Hydromorphone Hcl] Itching, Nausea Only and Rash    Past Medical History:  Diagnosis Date  . Anxiety   . Chest pain, exertional   . Chronic abdominal pain   . Chronic  lower back pain   . Depression 01/03/2012  . Diverticulosis   . 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 Fibroids 01/03/2012  . GERD (gastroesophageal reflux disease)   . Hepatitis C    "signs of; not active" (05/28/2013)  . HTN (hypertension), benign 01/03/2012  . Hyperlipidemia 01/03/2012  . Hypertension   . IBS (irritable bowel syndrome)   . Migraines    "a few times/yr" (05/28/2013)  . Nausea and vomiting    chronic, recurrent  . Seizure ([place])    "today; < 1 yr ago, they just happen; started having them in my 20's" (05/28/2013)  . Seizure disorder ([place])   . SLE (systemic lupus erythematosus) ([place])   . Ulcerative colitis     Past Surgical History:  Procedure Laterality Date  . ABDOMINAL HYSTERECTOMY  1978   "partial" (05/28/2013)'fibroids"  . BILATERAL OOPHORECTOMY Bilateral 1987  . CHOLECYSTECTOMY  1978  . COLONOSCOPY N/A 05/31/2013   Procedure: COLONOSCOPY;  Surgeon: Wonda Horner, MD;  Location: [facility] ENDOSCOPY;  Service: Endoscopy;  Laterality: N/A;  . ESOPHAGOGASTRODUODENOSCOPY (EGD) WITH PROPOFOL N/A 05/11/2014   Procedure: ESOPHAGOGASTRODUODENOSCOPY (EGD) WITH PROPOFOL;  Surgeon: Lear Ng, MD;  Location: [facility] ENDOSCOPY;  Service: Endoscopy;  Laterality: N/A;  egd first  . EUS N/A 06/21/2016   Procedure: ESOPHAGEAL ENDOSCOPIC ULTRASOUND (EUS) RADIAL;  Surgeon: Arta Silence, MD;  Location: [facility] ENDOSCOPY;  Service: Endoscopy;  Laterality: N/A;  . FLEXIBLE SIGMOIDOSCOPY N/A 05/11/2014   Procedure: FLEXIBLE SIGMOIDOSCOPY;  Surgeon: Lear Ng, MD;  Location: [facility] ENDOSCOPY;  Service: Endoscopy;  Laterality: N/A;  . TUBAL LIGATION      Social History:  reports that she has never smoked. She has never used smokeless tobacco. She reports that she drinks alcohol. She reports that she does not use drugs.  Family History:  Family History  Problem Relation Age of Onset  . Colon cancer Brother      Prior to Admission medications   Medication Sig Start Date End Date Taking?  Authorizing Provider  cyclobenzaprine (FLEXERIL) 5 MG tablet Take 1 tablet (5 mg total) by mouth 3 (three) times daily as needed for muscle spasms. 10/01/14  Yes Billy Fischer, MD  diazepam (VALIUM) 5 MG tablet Take 5 mg by mouth daily as needed for anxiety.  02/06/16  Yes Historical Provider, MD  dicyclomine (BENTYL) 20 MG tablet Take 1 tablet (20 mg total) by mouth 4 (four) times daily -  before meals and at bedtime. 06/12/16  Yes Antonietta Breach, PA-C  FLUoxetine (PROZAC) 20 MG capsule Take 20 mg by mouth every morning.   Yes Historical Provider, MD  levETIRAcetam (KEPPRA XR) 500 MG 24 hr tablet Take 500 mg by mouth 2 (two) times daily.    Yes Historical Provider, MD  LORazepam (ATIVAN) 1 MG tablet Take 1 tablet (1 mg total) by mouth 3 (three) times daily as needed for anxiety. 11/18/14  Yes [name] Laisure, PA-C  mesalamine (ROWASA) 4 G enema Place 4 g rectally at bedtime as needed (colitis flare up.).    Yes Historical Provider, MD  Multiple Vitamins-Minerals (CENTRUM SILVER 50+WOMEN) TABS Take 1 tablet by mouth every morning.   Yes Historical Provider, MD  ondansetron (ZOFRAN) 4 MG tablet  Take 1 tablet (4 mg total) by mouth every 6 (six) hours. 01/19/15  Yes Ezequiel Essex, MD  Oxycodone HCl 10 MG TABS Take 10 mg by mouth 3 (three) times daily as needed (for pain).  02/22/16  Yes Historical Provider, MD  Propylene Glycol (SYSTANE BALANCE OP) Place 2 drops into both eyes 2 (two) times daily as needed (dry eyes).    Yes Historical Provider, MD  triamterene-hydrochlorothiazide (MAXZIDE-25) 37.5-25 MG per tablet Take 1 tablet by mouth every morning.  06/16/14  Yes Historical Provider, MD  naproxen (NAPROSYN) 500 MG tablet Take 1 tablet (500 mg total) by mouth 2 (two) times daily as needed for mild pain, moderate pain or headache (TAKE WITH MEALS.). Patient not taking: Reported on 06/29/2016 11/05/14   Mercedes Camprubi-Soms, PA-C  promethazine (PHENERGAN) 25 MG tablet Take 1 tablet (25 mg total) by mouth every 6  (six) hours as needed for nausea or vomiting. Patient not taking: Reported on 06/29/2016 06/12/16   Antonietta Breach, PA-C    Physical Exam: Vitals:   06/29/16 1845 06/29/16 1900 06/29/16 1930 06/29/16 2000  BP: 125/58 132/81 119/62 120/73  Pulse:  94 91 96  Resp: 13 22 21 13   Temp:      TempSrc:      SpO2:  100% 95% 94%  Weight:      Height:       General: Not in acute distress. Dry mucus and membrane  HEENT:       Eyes: PERRL, EOMI, no scleral icterus.       ENT: No discharge from the ears and nose, no pharynx injection, no tonsillar enlargement.        Neck: No JVD, no bruit, no mass felt. Heme: No neck lymph node enlargement. Cardiac: S1/S2, RRR, No murmurs, No gallops or rubs. Respiratory: No rales, wheezing, rhonchi or rubs. GI: Soft, nondistended, nontender, no rebound pain, no organomegaly, BS present. GU: No hematuria Ext: No pitting leg edema bilaterally. 2+DP/PT pulse bilaterally. Musculoskeletal: No joint deformities, No joint redness or warmth, no limitation of ROM in spin. Skin: No rashes. Has bruises over left frontal area. Neuro: Alert, oriented X3, cranial nerves II-XII grossly intact. Pt has poor effort when testing muscle strength, 3/5 in all extremities, sensation to light touch intact. Brachial reflex 2+ bilaterally. Negative Babinski's sign. Normal finger to nose test. Psych: Patient is not psychotic, no suicidal or hemocidal ideation.  Labs on Admission: I have personally reviewed following labs and imaging studies  CBC:  Recent Labs Lab 06/29/16 1509  WBC 5.4  HGB 14.4  HCT 45.2  MCV 81.7  PLT 123456   Basic Metabolic Panel:  Recent Labs Lab 06/29/16 1509  NA 135  K 3.7  CL 104  CO2 21*  GLUCOSE 104*  BUN 9  CREATININE 0.62  CALCIUM 9.8   GFR: Estimated Creatinine Clearance: 87 mL/min (by C-G formula based on SCr of 0.8 mg/dL). Liver Function Tests: No results for input(s): AST, ALT, ALKPHOS, BILITOT, PROT, ALBUMIN in the last 168 hours. No  results for input(s): LIPASE, AMYLASE in the last 168 hours. No results for input(s): AMMONIA in the last 168 hours. Coagulation Profile: No results for input(s): INR, PROTIME in the last 168 hours. Cardiac Enzymes: No results for input(s): CKTOTAL, CKMB, CKMBINDEX, TROPONINI in the last 168 hours. BNP (last 3 results) No results for input(s): PROBNP in the last 8760 hours. HbA1C: No results for input(s): HGBA1C in the last 72 hours. CBG:  Recent Labs Lab 06/29/16 1505  GLUCAP 105*   Lipid Profile: No results for input(s): CHOL, HDL, LDLCALC, TRIG, CHOLHDL, LDLDIRECT in the last 72 hours. Thyroid Function Tests: No results for input(s): TSH, T4TOTAL, FREET4, T3FREE, THYROIDAB in the last 72 hours. Anemia Panel: No results for input(s): VITAMINB12, FOLATE, FERRITIN, TIBC, IRON, RETICCTPCT in the last 72 hours. Urine analysis:    Component Value Date/Time   COLORURINE YELLOW 06/29/2016 [place] 06/29/2016 1733   LABSPEC 1.014 06/29/2016 1733   PHURINE 7.0 06/29/2016 1733   GLUCOSEU NEGATIVE 06/29/2016 1733   HGBUR NEGATIVE 06/29/2016 1733   BILIRUBINUR NEGATIVE 06/29/2016 1733   KETONESUR NEGATIVE 06/29/2016 1733   PROTEINUR NEGATIVE 06/29/2016 1733   UROBILINOGEN 0.2 04/17/2015 0644   NITRITE NEGATIVE 06/29/2016 1733   LEUKOCYTESUR NEGATIVE 06/29/2016 1733   Sepsis Labs: @LABRCNTIP (procalcitonin:4,lacticidven:4) )No results found for this or any previous visit (from the past 240 hour(s)).   Radiological Exams on Admission: Dg Chest 1 View  Result Date: 06/29/2016 CLINICAL DATA:  60 year old female with syncope EXAM: CHEST 1 VIEW COMPARISON:  Chest radiograph dated 04/17/2015 FINDINGS: The heart size and mediastinal contours are within normal limits. Both lungs are clear. The visualized skeletal structures are unremarkable. IMPRESSION: No active disease. Electronically Signed   By: Anner Crete M.D.   On: 06/29/2016 20:55   Ct Head Wo  Contrast  Result Date: 06/29/2016 CLINICAL DATA:  left frontal abrasion and hematoma. EXAM: CT HEAD WITHOUT CONTRAST CT CERVICAL SPINE WITHOUT CONTRAST TECHNIQUE: Multidetector CT imaging of the head and cervical spine was performed following the standard protocol without intravenous contrast. Multiplanar CT image reconstructions of the cervical spine were also generated. COMPARISON:  03/20/2016 FINDINGS: CT HEAD FINDINGS Brain: No evidence of acute infarction, hemorrhage, hydrocephalus, extra-axial collection or mass lesion/mass effect. Vascular: No hyperdense vessel or unexpected calcification. Skull: Normal. Negative for fracture or focal lesion. Sinuses/Orbits: No acute finding. Other: None CT CERVICAL SPINE FINDINGS Alignment: Straightening of normal cervical lordosis. Skull base and vertebrae: No acute fracture. No primary bone lesion or focal pathologic process. Soft tissues and spinal canal: No prevertebral fluid or swelling. No visible canal hematoma. Disc levels: Multi level disc space narrowing and ventral endplate spurring is identified. This is most advanced at C4-5 C5-6 and C6-7. Upper chest: No mediastinal mass identified. The lung apices appear clear. Other: None IMPRESSION: 1. No acute intracranial abnormalities. 2. Cervical spondylosis.  No evidence for fracture or subluxation. Electronically Signed   By: Kerby Moors M.D.   On: 06/29/2016 17:54   Ct Cervical Spine Wo Contrast  Result Date: 06/29/2016 CLINICAL DATA:  left frontal abrasion and hematoma. EXAM: CT HEAD WITHOUT CONTRAST CT CERVICAL SPINE WITHOUT CONTRAST TECHNIQUE: Multidetector CT imaging of the head and cervical spine was performed following the standard protocol without intravenous contrast. Multiplanar CT image reconstructions of the cervical spine were also generated. COMPARISON:  03/20/2016 FINDINGS: CT HEAD FINDINGS Brain: No evidence of acute infarction, hemorrhage, hydrocephalus, extra-axial collection or mass  lesion/mass effect. Vascular: No hyperdense vessel or unexpected calcification. Skull: Normal. Negative for fracture or focal lesion. Sinuses/Orbits: No acute finding. Other: None CT CERVICAL SPINE FINDINGS Alignment: Straightening of normal cervical lordosis. Skull base and vertebrae: No acute fracture. No primary bone lesion or focal pathologic process. Soft tissues and spinal canal: No prevertebral fluid or swelling. No visible canal hematoma. Disc levels: Multi level disc space narrowing and ventral endplate spurring is identified. This is most advanced at C4-5 C5-6 and C6-7. Upper chest: No mediastinal mass identified. The lung  apices appear clear. Other: None IMPRESSION: 1. No acute intracranial abnormalities. 2. Cervical spondylosis.  No evidence for fracture or subluxation. Electronically Signed   By: Kerby Moors M.D.   On: 06/29/2016 17:54     EKG: Independently reviewed. Artificial effects, low voltage, Sinus rhythm, QTC 433, mild T-wave inversion in lead 2/aVF, nonspecific T-wave change.  Assessment/Plan Principal Problem:   Syncope Active Problems:   Chest pain   HTN (hypertension), benign   Seizure (HCC)   Depression   Chronic back pain   Ulcerative colitis (HCC)   Left-sided weakness   Facial twitching   IBS (irritable bowel syndrome)   Syncope: Etiology is not clear, differential diagnosis include stroke given left-sided weakness, seizure, PE (given pleuritic chest pain and shortness stress), vasovagal syncope, orthostatic status (patient is on Maxzide, she is clinically dry) and cardiac arrhythmia  -will place on tele bed for obs -will get MRI of brain to r/o stroke -EEG -Orthostatic vital sign -IV fluid: Normal saline 1 L, then 75 mL per hour -PT/OT  Left-sided weakness: pt report left-sided weakness, but no focal neurological findings on physical examination. -Follow-up MRI for brain -pt is allergic to ASA  Chest pain: she has atypical chest pain, has pleuritic  nature. Will need to r/o PE -will get D-dimer. If possive-->will get CTA to r/o PE.  cycle CE q6 x3 and repeat her EKG in the am  - prn Nitroglycerin, Morphine -pt is allergic to ASA - Risk factor stratification: will check FLP, UDS and A1C  - 2d echo  Addendum: D-dimer is positive 0.57-->wil get CTA to r/o PE and LE venous doppler to r/o DVT.  HTN: bp 108/76 -hold Maxzide  -IV hydralazine when necessary  Seizure ([place]): on keppra. Not if pt had seizure, but not seizure noted in ED when she had another episode of syncope in ED. She has left facial twitching -will continue Keppra -seizure precaution.  Depression and anxiety: no suicidal or homicidal ideations. Patient is on prn Valium and prn Ativan -Continue home prn Ativan - hold Valium -continue Prozac  Chronic back pain: -continue home hen necessary oxycodone  Facial twitching: she has left facial twitching with unclear etiology, may be related to left frontal area injury -Observe closely.  Hx of UC and IBS: Patient has chronic abdominal pain and diarrhea, which is at the baseline. -Continue home Bentyl, mesalamine   DVT ppx: SQ Lovenox Code Status: Full code Family Communication: Yes, patient's husband at bed side Disposition Plan:  Anticipate discharge back to previous home environment Consults called:  none Admission status: Obs / tele   Date of Service 06/29/2016    Ivor Costa [facility] Pager [phone]  If 7PM-7AM, please contact night-coverage [email or web address] Password [facility] 06/29/2016, 9:34 PM

## 2016-06-29 NOTE — ED Notes (Signed)
Pt refusing C collar

## 2016-06-29 NOTE — ED Notes (Signed)
Pt to xray, MRI, then upstairs. Sharyn Lull on 2W aware

## 2016-06-29 NOTE — ED Provider Notes (Signed)
[place] DEPT Provider Note   CSN: [id number] Arrival date & time: 06/29/16  1431     History   Chief Complaint Chief Complaint  Patient presents with  . Loss of Consciousness  . Abdominal Pain  . Headache    HPI Erin Wolf is a 60 y.o. female.  Patient indicates had syncopal event at home shortly pta today. States had gotten up, was walking through house, felt faint/lightheaded and passed out. Does not know how long was on ground, or duration of loc.  Was able to get self up.   Daughter subsequently went to check on patient, patient seemed confused/unclear on what had happened, so brought to ED.  Patient indicates in past week generally had not felt well, and had relatively poor po intake. Post fall/syncope, dull frontal headache, and forehead contusion. No preceding headache. No nv.  Patient denies neck or back pain. No focal or unilateral numbness or weakness. No change in speech or vision. No fever or chills. Compliant w normal meds, denies recent change in meds or doses. Denies blood loss, no melena. Hx seizure.  Denies sz activity today, however, was alone when became unresponsive.  No incontinence.  No  dysuria or gu c/o. No mouth or tongue injury.        The history is provided by the patient.  Loss of Consciousness   Associated symptoms include abdominal pain, confusion and headaches. Pertinent negatives include back pain, chest pain, fever, vomiting and weakness.  Abdominal Pain   Associated symptoms include headaches. Pertinent negatives include fever, vomiting and dysuria.  Headache   Associated symptoms include syncope. Pertinent negatives include no fever, no shortness of breath and no vomiting.    Past Medical History:  Diagnosis Date  . Anxiety   . Chest pain, exertional   . Chronic abdominal pain   . Chronic lower back pain   . Depression 01/03/2012  . Diverticulosis   . 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 Fibroids 01/03/2012  . GERD  (gastroesophageal reflux disease)   . Hepatitis C    "signs of; not active" (05/28/2013)  . HTN (hypertension), benign 01/03/2012  . Hyperlipidemia 01/03/2012  . Hypertension   . IBS (irritable bowel syndrome)   . Migraines    "a few times/yr" (05/28/2013)  . Nausea and vomiting    chronic, recurrent  . Seizure ([place])    "today; < 1 yr ago, they just happen; started having them in my 20's" (05/28/2013)  . Seizure disorder ([place])   . SLE (systemic lupus erythematosus) ([place])   . Ulcerative colitis     Patient Active Problem List   Diagnosis Date Noted  . Dehydration 05/09/2014  . GERD (gastroesophageal reflux disease) 05/09/2014  . Gastroenteritis 05/09/2014  . Chest pain, atypical 05/09/2014  . Chronic ulcerative colitis ([place]) 05/09/2014  . Acute gastroenteritis 05/09/2014  . Ulcerative colitis ([place]) 05/27/2013  . Hypokalemia 05/27/2013  . Anemia 01/04/2012  . Transaminitis 01/04/2012  . Diarrhea 01/04/2012  . Syncope and collapse 01/03/2012  . Chest pain 01/03/2012  . SOB (shortness of breath) 01/03/2012  . HTN (hypertension), benign 01/03/2012  . Hx of migraines 01/03/2012  . Seizure ([place]) 01/03/2012  . Depression 01/03/2012  . Ulcerative colitis 01/03/2012  . Chronic back pain 01/03/2012  . 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 Hyperlipidemia 01/03/2012  . Hepatitis C 01/03/2012  . Fibroids 01/03/2012    Past Surgical History:  Procedure Laterality Date  . ABDOMINAL HYSTERECTOMY  1978   "partial" (05/28/2013)'fibroids"  . BILATERAL OOPHORECTOMY  Bilateral 1987  . CHOLECYSTECTOMY  1978  . COLONOSCOPY N/A 05/31/2013   Procedure: COLONOSCOPY;  Surgeon: Wonda Horner, MD;  Location: [facility] ENDOSCOPY;  Service: Endoscopy;  Laterality: N/A;  . ESOPHAGOGASTRODUODENOSCOPY (EGD) WITH PROPOFOL N/A 05/11/2014   Procedure: ESOPHAGOGASTRODUODENOSCOPY (EGD) WITH PROPOFOL;  Surgeon: Lear Ng, MD;  Location: [facility] ENDOSCOPY;  Service: Endoscopy;  Laterality: N/A;  egd first  . EUS N/A  06/21/2016   Procedure: ESOPHAGEAL ENDOSCOPIC ULTRASOUND (EUS) RADIAL;  Surgeon: Arta Silence, MD;  Location: [facility] ENDOSCOPY;  Service: Endoscopy;  Laterality: N/A;  . FLEXIBLE SIGMOIDOSCOPY N/A 05/11/2014   Procedure: FLEXIBLE SIGMOIDOSCOPY;  Surgeon: Lear Ng, MD;  Location: [facility] ENDOSCOPY;  Service: Endoscopy;  Laterality: N/A;  . TUBAL LIGATION      OB History    No data available       Home Medications    Prior to Admission medications   Medication Sig Start Date End Date Taking? Authorizing Provider  cyclobenzaprine (FLEXERIL) 5 MG tablet Take 1 tablet (5 mg total) by mouth 3 (three) times daily as needed for muscle spasms. Patient not taking: Reported on 04/17/2015 10/01/14   Billy Fischer, MD  diazepam (VALIUM) 5 MG tablet Take 5 mg by mouth daily as needed. 02/06/16   Historical Provider, MD  dicyclomine (BENTYL) 20 MG tablet Take 1 tablet (20 mg total) by mouth 4 (four) times daily -  before meals and at bedtime. 06/12/16   Antonietta Breach, PA-C  DiphenhydrAMINE HCl (THERAFLU MULTI SYMPTOM PO) Take 1 packet by mouth every 8 (eight) hours as needed (for symptoms).    Historical Provider, MD  docusate sodium (COLACE) 100 MG capsule Take 100 mg by mouth daily as needed for mild constipation.    Historical Provider, MD  FLUoxetine (PROZAC) 20 MG capsule Take 20 mg by mouth every morning.    Historical Provider, MD  levETIRAcetam (KEPPRA XR) 500 MG 24 hr tablet Take 500 mg by mouth 2 (two) times daily.     Historical Provider, MD  LORazepam (ATIVAN) 1 MG tablet Take 1 tablet (1 mg total) by mouth 3 (three) times daily as needed for anxiety. 11/18/14   Hyman Bible, PA-C  mesalamine (ROWASA) 4 G enema Place 4 g rectally at bedtime as needed (colitis flare up.).     Historical Provider, MD  Multiple Vitamin (MULTIVITAMIN WITH MINERALS) TABS tablet Take 1 tablet by mouth daily.    Historical Provider, MD  naproxen (NAPROSYN) 500 MG tablet Take 1 tablet (500 mg total) by mouth 2 (two)  times daily as needed for mild pain, moderate pain or headache (TAKE WITH MEALS.). 11/05/14   Mercedes Camprubi-Soms, PA-C  ondansetron (ZOFRAN) 4 MG tablet Take 1 tablet (4 mg total) by mouth every 6 (six) hours. 01/19/15   Ezequiel Essex, MD  Oxycodone HCl 10 MG TABS Take 10 mg by mouth 3 (three) times daily as needed (for pain).  02/22/16   Historical Provider, MD  promethazine (PHENERGAN) 25 MG tablet Take 1 tablet (25 mg total) by mouth every 6 (six) hours as needed for nausea or vomiting. 06/12/16   Antonietta Breach, PA-C  Propylene Glycol (SYSTANE BALANCE OP) Place 2 drops into both eyes 2 (two) times daily as needed (dry eyes).     Historical Provider, MD  triamterene-hydrochlorothiazide (MAXZIDE-25) 37.5-25 MG per tablet Take 1 tablet by mouth daily. 06/16/14   Historical Provider, MD    Family History Family History  Problem Relation Age of Onset  . Colon cancer Brother  Social History Social History  Substance Use Topics  . Smoking status: Never Smoker  . Smokeless tobacco: Never Used  . Alcohol use Yes     Comment: 05/28/2013 "glass of wine rarely; weddings, etc"     Allergies   Aspirin; Darvocet [propoxyphene n-acetaminophen]; Isometheptene-dichloral-apap; Latex; Penicillins; Tape; Toradol [ketorolac tromethamine]; Tylenol [acetaminophen]; Wellbutrin [bupropion hcl]; and Dilaudid [hydromorphone hcl]   Review of Systems Review of Systems  Constitutional: Negative for chills and fever.  HENT: Negative for sore throat.   Eyes: Negative for redness and visual disturbance.  Respiratory: Negative for shortness of breath.   Cardiovascular: Positive for syncope. Negative for chest pain.  Gastrointestinal: Positive for abdominal pain. Negative for vomiting.  Genitourinary: Negative for dysuria and flank pain.  Musculoskeletal: Negative for back pain, neck pain and neck stiffness.  Skin: Negative for rash and wound.  Neurological: Positive for headaches. Negative for weakness and  numbness.  Hematological: Does not bruise/bleed easily.  Psychiatric/Behavioral: Positive for confusion.     Physical Exam Updated Vital Signs BP 141/89   Pulse 99   Temp 98.1 F (36.7 C) (Oral)   Resp 23   Ht 5\' 4"  (1.626 m)   Wt 102.1 kg   SpO2 98%   BMI 38.62 kg/m   Physical Exam  Constitutional: She is oriented to person, place, and time. She appears well-developed and well-nourished. No distress.  HENT:  Mouth/Throat: Oropharynx is clear and moist.  Contusion left forehead. Facial bones/orbits grossly intact. No oral or tongue trauma.   Eyes: Conjunctivae are normal. Pupils are equal, round, and reactive to light. No scleral icterus.  Neck: Normal range of motion. Neck supple. No tracheal deviation present. No thyromegaly present.  No bruits.   Cardiovascular: Normal rate, regular rhythm, normal heart sounds and intact distal pulses.  Exam reveals no gallop and no friction rub.   No murmur heard. Pulmonary/Chest: Effort normal and breath sounds normal. No respiratory distress. She exhibits no tenderness.  Abdominal: Soft. Normal appearance and bowel sounds are normal. She exhibits no distension and no mass. There is no tenderness. There is no rebound and no guarding. No hernia.  No abd contusion or bruising.   Genitourinary:  Genitourinary Comments: No cva tenderness  Musculoskeletal: She exhibits no edema or tenderness.  Mid cervical tenderness, otherwise, CTLS spine, non tender, aligned, no step off. Good rom bil ext without pain, no focal bony tenderness, distal pulses palp bil.   Neurological: She is alert and oriented to person, place, and time.  No pronator drift. Motor intact bil. stre 5/5.  sens grossly intact.   Skin: Skin is warm and dry. No rash noted.  Psychiatric: She has a normal mood and affect.  Nursing note and vitals reviewed.    ED Treatments / Results  Labs (all labs ordered are listed, but only abnormal results are displayed) Results for  orders placed or performed during the hospital encounter of [id number]  Basic metabolic panel  Result Value Ref Range   Sodium 135 135 - 145 mmol/L   Potassium 3.7 3.5 - 5.1 mmol/L   Chloride 104 101 - 111 mmol/L   CO2 21 (L) 22 - 32 mmol/L   Glucose, Bld 104 (H) 65 - 99 mg/dL   BUN 9 6 - 20 mg/dL   Creatinine, Ser 0.62 0.44 - 1.00 mg/dL   Calcium 9.8 8.9 - 10.3 mg/dL   GFR calc non Af Amer >60 >60 mL/min   GFR calc Af Amer >60 >60 mL/min   Anion  gap 10 5 - 15  CBC  Result Value Ref Range   WBC 5.4 4.0 - 10.5 K/uL   RBC 5.53 (H) 3.87 - 5.11 MIL/uL   Hemoglobin 14.4 12.0 - 15.0 g/dL   HCT 45.2 36.0 - 46.0 %   MCV 81.7 78.0 - 100.0 fL   MCH 26.0 26.0 - 34.0 pg   MCHC 31.9 30.0 - 36.0 g/dL   RDW 15.4 11.5 - 15.5 %   Platelets 346 150 - 400 K/uL  Urinalysis, Routine w reflex microscopic  Result Value Ref Range   Color, Urine YELLOW YELLOW   APPearance CLEAR CLEAR   Specific Gravity, Urine 1.014 1.005 - 1.030   pH 7.0 5.0 - 8.0   Glucose, UA NEGATIVE NEGATIVE mg/dL   Hgb urine dipstick NEGATIVE NEGATIVE   Bilirubin Urine NEGATIVE NEGATIVE   Ketones, ur NEGATIVE NEGATIVE mg/dL   Protein, ur NEGATIVE NEGATIVE mg/dL   Nitrite NEGATIVE NEGATIVE   Leukocytes, UA NEGATIVE NEGATIVE  CBG monitoring, ED  Result Value Ref Range   Glucose-Capillary 105 (H) 65 - 99 mg/dL   Ct Head Wo Contrast  Result Date: 06/29/2016 CLINICAL DATA:  left frontal abrasion and hematoma. EXAM: CT HEAD WITHOUT CONTRAST CT CERVICAL SPINE WITHOUT CONTRAST TECHNIQUE: Multidetector CT imaging of the head and cervical spine was performed following the standard protocol without intravenous contrast. Multiplanar CT image reconstructions of the cervical spine were also generated. COMPARISON:  03/20/2016 FINDINGS: CT HEAD FINDINGS Brain: No evidence of acute infarction, hemorrhage, hydrocephalus, extra-axial collection or mass lesion/mass effect. Vascular: No hyperdense vessel or unexpected calcification. Skull: Normal.  Negative for fracture or focal lesion. Sinuses/Orbits: No acute finding. Other: None CT CERVICAL SPINE FINDINGS Alignment: Straightening of normal cervical lordosis. Skull base and vertebrae: No acute fracture. No primary bone lesion or focal pathologic process. Soft tissues and spinal canal: No prevertebral fluid or swelling. No visible canal hematoma. Disc levels: Multi level disc space narrowing and ventral endplate spurring is identified. This is most advanced at C4-5 C5-6 and C6-7. Upper chest: No mediastinal mass identified. The lung apices appear clear. Other: None IMPRESSION: 1. No acute intracranial abnormalities. 2. Cervical spondylosis.  No evidence for fracture or subluxation. Electronically Signed   By: Kerby Moors M.D.   On: 06/29/2016 17:54   Ct Cervical Spine Wo Contrast  Result Date: 06/29/2016 CLINICAL DATA:  left frontal abrasion and hematoma. EXAM: CT HEAD WITHOUT CONTRAST CT CERVICAL SPINE WITHOUT CONTRAST TECHNIQUE: Multidetector CT imaging of the head and cervical spine was performed following the standard protocol without intravenous contrast. Multiplanar CT image reconstructions of the cervical spine were also generated. COMPARISON:  03/20/2016 FINDINGS: CT HEAD FINDINGS Brain: No evidence of acute infarction, hemorrhage, hydrocephalus, extra-axial collection or mass lesion/mass effect. Vascular: No hyperdense vessel or unexpected calcification. Skull: Normal. Negative for fracture or focal lesion. Sinuses/Orbits: No acute finding. Other: None CT CERVICAL SPINE FINDINGS Alignment: Straightening of normal cervical lordosis. Skull base and vertebrae: No acute fracture. No primary bone lesion or focal pathologic process. Soft tissues and spinal canal: No prevertebral fluid or swelling. No visible canal hematoma. Disc levels: Multi level disc space narrowing and ventral endplate spurring is identified. This is most advanced at C4-5 C5-6 and C6-7. Upper chest: No mediastinal mass  identified. The lung apices appear clear. Other: None IMPRESSION: 1. No acute intracranial abnormalities. 2. Cervical spondylosis.  No evidence for fracture or subluxation. Electronically Signed   By: Kerby Moors M.D.   On: 06/29/2016 17:54  EKG  EKG Interpretation  Date/Time:  Saturday June 29 2016 15:05:54 EDT Ventricular Rate:  104 PR Interval:  170 QRS Duration: 70 QT Interval:  330 QTC Calculation: 433 R Axis:   24 Text Interpretation:  Sinus tachycardia Nonspecific T wave abnormality Baseline wander No significant change since last tracing Confirmed by Ashok Cordia  MD, Lennette Bihari ([id number]) on 06/29/2016 3:50:12 PM       Radiology No results found.  Procedures Procedures (including critical care time)  Medications Ordered in ED Medications  sodium chloride 0.9 % bolus 1,000 mL (1,000 mLs Intravenous New Bag/Given 06/29/16 1621)     Initial Impression / Assessment and Plan / ED Course  I have reviewed the triage vital signs and the nursing notes.  Pertinent labs & imaging results that were available during my care of the patient were reviewed by me and considered in my medical decision making (see chart for details).  Clinical Course   Iv ns.   Labs.  Reviewed nursing notes and prior charts for additional history.   Orthostatic vitals, upon sitting up, felt faint, ?brief syncopal event. No seizure activity noted.  Given syncope, fall/head contusion, will admit.   Hospitalists consulted.  Will place temp orders, obs stay.   Final Clinical Impressions(s) / ED Diagnoses   Final diagnoses:  None    New Prescriptions New Prescriptions   No medications on file     Lajean Saver, MD 06/29/16 [phone]

## 2016-06-29 NOTE — ED Notes (Signed)
Patient transported to x-ray. ?

## 2016-06-30 ENCOUNTER — Observation Stay (HOSPITAL_COMMUNITY): Payer: Medicare Other

## 2016-06-30 ENCOUNTER — Observation Stay (HOSPITAL_BASED_OUTPATIENT_CLINIC_OR_DEPARTMENT_OTHER): Payer: Medicare Other

## 2016-06-30 DIAGNOSIS — Z79899 Other long term (current) drug therapy: Secondary | ICD-10-CM | POA: Diagnosis not present

## 2016-06-30 DIAGNOSIS — E7439 Other disorders of intestinal carbohydrate absorption: Secondary | ICD-10-CM | POA: Diagnosis present

## 2016-06-30 DIAGNOSIS — R55 Syncope and collapse: Principal | ICD-10-CM

## 2016-06-30 DIAGNOSIS — M549 Dorsalgia, unspecified: Secondary | ICD-10-CM

## 2016-06-30 DIAGNOSIS — R079 Chest pain, unspecified: Secondary | ICD-10-CM

## 2016-06-30 DIAGNOSIS — S0003XA Contusion of scalp, initial encounter: Secondary | ICD-10-CM | POA: Diagnosis present

## 2016-06-30 DIAGNOSIS — F419 Anxiety disorder, unspecified: Secondary | ICD-10-CM | POA: Diagnosis present

## 2016-06-30 DIAGNOSIS — M79661 Pain in right lower leg: Secondary | ICD-10-CM | POA: Diagnosis not present

## 2016-06-30 DIAGNOSIS — W19XXXA Unspecified fall, initial encounter: Secondary | ICD-10-CM | POA: Diagnosis present

## 2016-06-30 DIAGNOSIS — K589 Irritable bowel syndrome without diarrhea: Secondary | ICD-10-CM | POA: Diagnosis present

## 2016-06-30 DIAGNOSIS — G514 Facial myokymia: Secondary | ICD-10-CM

## 2016-06-30 DIAGNOSIS — R072 Precordial pain: Secondary | ICD-10-CM | POA: Diagnosis not present

## 2016-06-30 DIAGNOSIS — I10 Essential (primary) hypertension: Secondary | ICD-10-CM

## 2016-06-30 DIAGNOSIS — E785 Hyperlipidemia, unspecified: Secondary | ICD-10-CM | POA: Diagnosis present

## 2016-06-30 DIAGNOSIS — G40409 Other generalized epilepsy and epileptic syndromes, not intractable, without status epilepticus: Secondary | ICD-10-CM | POA: Diagnosis present

## 2016-06-30 DIAGNOSIS — G8929 Other chronic pain: Secondary | ICD-10-CM

## 2016-06-30 DIAGNOSIS — M545 Low back pain: Secondary | ICD-10-CM | POA: Diagnosis present

## 2016-06-30 DIAGNOSIS — F329 Major depressive disorder, single episode, unspecified: Secondary | ICD-10-CM | POA: Diagnosis present

## 2016-06-30 DIAGNOSIS — R569 Unspecified convulsions: Secondary | ICD-10-CM

## 2016-06-30 LAB — GLUCOSE, CAPILLARY: GLUCOSE-CAPILLARY: 96 mg/dL (ref 65–99)

## 2016-06-30 LAB — BASIC METABOLIC PANEL
Anion gap: 6 (ref 5–15)
BUN: 10 mg/dL (ref 6–20)
CALCIUM: 8.7 mg/dL — AB (ref 8.9–10.3)
CO2: 27 mmol/L (ref 22–32)
CREATININE: 0.68 mg/dL (ref 0.44–1.00)
Chloride: 107 mmol/L (ref 101–111)
Glucose, Bld: 88 mg/dL (ref 65–99)
Potassium: 3.6 mmol/L (ref 3.5–5.1)
SODIUM: 140 mmol/L (ref 135–145)

## 2016-06-30 LAB — TROPONIN I: Troponin I: <0.03 ng/mL (ref <0.03)

## 2016-06-30 LAB — ECHOCARDIOGRAM COMPLETE
CHL CUP MV DEC (S): 183
E/e' ratio: 4.8
EWDT: 183 ms
FS: 33 % (ref 28–44)
Height: 64 in
IV/PV OW: 0.99
LA diam end sys: 34 mm
LA diam index: 1.65 cm/m2
LASIZE: 34 mm
LAVOL: 23.3 mL
LAVOLA4C: 19.6 mL
LAVOLIN: 11.3 mL/m2
LV TDI E'LATERAL: 10.7
LV TDI E'MEDIAL: 9.9
LVEEAVG: 4.8
LVEEMED: 4.8
LVELAT: 10.7 cm/s
LVOT area: 2.84 cm2
LVOT diameter: 19 mm
Lateral S' vel: 10.2 cm/s
MV pk A vel: 45.1 m/s
MV pk E vel: 51.4 m/s
PW: 10.1 mm — AB (ref 0.6–1.1)
TAPSE: 18 mm
Weight: 3609.6 oz

## 2016-06-30 LAB — LIPID PANEL
Cholesterol: 222 mg/dL — ABNORMAL HIGH (ref 0–200)
HDL: 60 mg/dL (ref >40)
LDL CALC: 135 mg/dL — AB (ref 0–99)
TRIGLYCERIDES: 134 mg/dL (ref <150)
Total CHOL/HDL Ratio: 3.7 RATIO
VLDL: 27 mg/dL (ref 0–40)

## 2016-06-30 LAB — CBC
HCT: 40.1 % (ref 36.0–46.0)
Hemoglobin: 12.4 g/dL (ref 12.0–15.0)
MCH: 25.6 pg — AB (ref 26.0–34.0)
MCHC: 30.9 g/dL (ref 30.0–36.0)
MCV: 82.7 fL (ref 78.0–100.0)
PLATELETS: 282 10*3/uL (ref 150–400)
RBC: 4.85 MIL/uL (ref 3.87–5.11)
RDW: 15.8 % — ABNORMAL HIGH (ref 11.5–15.5)
WBC: 4.9 10*3/uL (ref 4.0–10.5)

## 2016-06-30 LAB — LIPASE, BLOOD: LIPASE: 44 U/L (ref 11–51)

## 2016-06-30 LAB — D-DIMER, QUANTITATIVE: D-Dimer, Quant: 0.57 ug/mL-FEU — ABNORMAL HIGH (ref 0.00–0.50)

## 2016-06-30 MED ORDER — SODIUM CHLORIDE 0.9 % IV SOLN
INTRAVENOUS | Status: AC
  Administered 2016-06-30: 14:00:00 via INTRAVENOUS

## 2016-06-30 MED ORDER — IOPAMIDOL (ISOVUE-370) INJECTION 76%
INTRAVENOUS | Status: AC
  Administered 2016-06-30: 100 mL
  Filled 2016-06-30: qty 100

## 2016-06-30 MED ORDER — LEVETIRACETAM ER 500 MG PO TB24
1000.0000 mg | ORAL_TABLET | Freq: Every day | ORAL | Status: DC
  Administered 2016-06-30: 1000 mg via ORAL
  Filled 2016-06-30 (×3): qty 2

## 2016-06-30 MED ORDER — LEVETIRACETAM ER 500 MG PO TB24
500.0000 mg | ORAL_TABLET | Freq: Every day | ORAL | Status: DC
  Administered 2016-07-01: 500 mg via ORAL
  Filled 2016-06-30: qty 1

## 2016-06-30 NOTE — Progress Notes (Signed)
Pt had syncopal episode while working with PT and OT. This RN was called into the room to check on the pt. Upon assessment, pt was laying in the bed, pt was responsive, left eye was twitching, and vitals were being taken. BP was 127/58, oxygen saturation was 95%, and heart rate was 79. Charge RN came into room and pt was asking her about guest wifi password. Pt stated that these syncopal episodes have been going on for the past month and that her twin sister has been experiencing the same symptoms as the pt. The pt began showing pictures from her phone to the staff in the room. Pt appeared to be at baseline. Will continue to monitor pt.   Grant Fontana BSN, RN

## 2016-06-30 NOTE — Evaluation (Signed)
Physical Therapy Evaluation Patient Details Name: Erin Wolf MRN: [id number] DOB: 19-Apr-1956 Today's Date: 06/30/2016   History of Present Illness  Patient is a 60 yo female admitted 06/29/16 following syncopal episode.  Patient reports chest pain, SOB, Lt-sided weakness, general weakness, facial twitching, N/V.   MRI was negative.  CTA to r/o PE was negative.     PMH:  HTN, HLD, SLE, depression, anxiety, seizures, chronic back and abdominal pain    Clinical Impression  Patient presents with problems listed below.  Will benefit from acute PT to maximize functional mobility prior to discharge.  Session today limited by syncopal episode in sitting.  RN to room to evaluate patient.  Patient with multiple episodes over past month, along with progressive weakness.  Both impacting functional mobility, ADL's, and safety.  Recommend ST-SNF at d/c for continued therapy with goal to reach Mod I level for patient to return home safely.    Follow Up Recommendations SNF;Supervision for mobility/OOB    Equipment Recommendations  Rolling walker with 5" wheels (continue to assess)    Recommendations for Other Services       Precautions / Restrictions Precautions Precautions: Fall Precaution Comments: syncopal episodes Restrictions Weight Bearing Restrictions: No      Mobility  Bed Mobility Overal bed mobility: Needs Assistance Bed Mobility: Supine to Sit;Sit to Supine     Supine to sit: Min guard;HOB elevated Sit to supine: Total assist;+2 for physical assistance   General bed mobility comments: Patient able to move LE's off of bed and raise trunk to sitting with min guard assist for safety.  Cues to scoot to EOB.  Patient began falling to left and posteriorly - eyes shut and non-responsive.  Total assist of +2 to move patient to supine position.  Remained unresponsive for approx 20-30 seconds.  RN called to room.  BP in supine 127/58, HR 90 bpm, O2 sats 95%.  Patient responsive/talkative  as PT/OT left room.  Transfers                 General transfer comment: NT  Ambulation/Gait                Stairs            Wheelchair Mobility    Modified Rankin (Stroke Patients Only)       Balance Overall balance assessment: Needs assistance;History of Falls Sitting-balance support: No upper extremity supported;Feet unsupported Sitting balance-Leahy Scale: Fair Sitting balance - Comments: Patient maintaining sitting balance prior to syncopal episode.                                     Pertinent Vitals/Pain Pain Assessment: 0-10 Pain Score: 8  Pain Location: Abdomen Pain Descriptors / Indicators: Cramping Pain Intervention(s): Monitored during session;Patient requesting pain meds-RN notified;RN gave pain meds during session    Home Living Family/patient expects to be discharged to:: Private residence Living Arrangements: Spouse/significant other Available Help at Discharge: Family;Available PRN/intermittently (Husband and daughter work) Type of Home: House Home Access: Stairs to enter Entrance Stairs-Rails: Psychiatric nurse of Steps: 4 Home Layout: One level Home Equipment: Cane - single point      Prior Function Level of Independence: Independent (Until approx 1 month ago when weakness and syncope started)               Hand Dominance   Dominant Hand: Right    Extremity/Trunk Assessment  Upper Extremity Assessment: Defer to OT evaluation           Lower Extremity Assessment: Generalized weakness (Reports numbness in feet; Can lift LE's off of bed)         Communication   Communication: No difficulties  Cognition Arousal/Alertness: Awake/alert Behavior During Therapy: WFL for tasks assessed/performed;Anxious Overall Cognitive Status: Within Functional Limits for tasks assessed                      General Comments      Exercises        Assessment/Plan    PT  Assessment Patient needs continued PT services  PT Diagnosis Difficulty walking;Generalized weakness;Acute pain   PT Problem List Decreased strength;Decreased activity tolerance;Decreased balance;Decreased mobility;Decreased knowledge of use of DME;Impaired sensation;Obesity;Pain  PT Treatment Interventions DME instruction;Gait training;Functional mobility training;Therapeutic activities;Therapeutic exercise;Balance training;Patient/family education   PT Goals (Current goals can be found in the Care Plan section) Acute Rehab PT Goals Patient Stated Goal: To feel better PT Goal Formulation: With patient Time For Goal Achievement: 07/14/16 Potential to Achieve Goals: Good    Frequency Min 3X/week   Barriers to discharge Decreased caregiver support Husband and daughter work.  Patient home alone during day.    Co-evaluation               End of Session   Activity Tolerance: Patient limited by pain;Treatment limited secondary to medical complications (Comment) (Syncope - stopped session) Patient left: in bed;with call bell/phone within reach;with bed alarm set;with restraints reapplied (4 rails with padding up) Nurse Communication: Mobility status;Other (comment) (To room following syncopal episode to evaluate patient)    Functional Assessment Tool Used: Clinical judgement Functional Limitation: Mobility: Walking and moving around Mobility: Walking and Moving Around Current Status [phone]): At least 40 percent but less than 60 percent impaired, limited or restricted Mobility: Walking and Moving Around Goal Status [phone]): At least 1 percent but less than 20 percent impaired, limited or restricted    Time: 1145-1212 PT Time Calculation (min) (ACUTE ONLY): 27 min   Charges:   PT Evaluation $PT Eval Moderate Complexity: 1 Procedure     PT G Codes:   PT G-Codes **NOT FOR INPATIENT CLASS** Functional Assessment Tool Used: Clinical judgement Functional Limitation: Mobility: Walking  and moving around Mobility: Walking and Moving Around Current Status [phone]): At least 40 percent but less than 60 percent impaired, limited or restricted Mobility: Walking and Moving Around Goal Status [phone]): At least 1 percent but less than 20 percent impaired, limited or restricted    Despina Pole 06/30/2016, 12:55 PM Carita Pian. Sanjuana Kava, [place] Pager [phone]

## 2016-06-30 NOTE — Consult Note (Signed)
Reason for Consult:   Syncope  Requesting Physician: [facility]ologist New  HPI:   60 y/o AA female with multiple medical issues as listed below. She has a twin sister with similar problems. She denies any history of documented CAD. She has had palpitations but no history of syncope. She has a seizure disorder but has not had a seizure in more than a year since she was placed on Keppra. She tells me she hasn't felt herself lately. "Run down". Last week she had an unwitnessed syncopal event. Her husband was unable to contact her and found her on the floor in the bedroom when he got home. She has had some palpitations but denies any nausea, vomiting, or tachycardia pre syncope.  Today she got off the phone with her daughter and the next thing she knows her daughter was waking her up 15 minutes later. She received a small abrasion to her forehead. Troponin have been negative, telemetry shows NSR PVCs, other labs unremarkable. Chest pain mentioned on her admission note but the patient tells me she can't remember.   PMHx:  Past Medical History:  Diagnosis Date  . Anxiety   . Chest pain, exertional   . Chronic abdominal pain   . Chronic lower back pain   . Depression 01/03/2012  . Diverticulosis   . 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 Fibroids 01/03/2012  . GERD (gastroesophageal reflux disease)   . Hepatitis C    "signs of; not active" (05/28/2013)  . HTN (hypertension), benign 01/03/2012  . Hyperlipidemia 01/03/2012  . Hypertension   . IBS (irritable bowel syndrome)   . Migraines    "a few times/yr" (05/28/2013)  . Nausea and vomiting    chronic, recurrent  . Seizure ([place])    "today; < 1 yr ago, they just happen; started having them in my 20's" (05/28/2013)  . Seizure disorder ([place])   . SLE (systemic lupus erythematosus) ([place])   . Ulcerative colitis     Past Surgical History:  Procedure Laterality Date  . ABDOMINAL HYSTERECTOMY  1978   "partial"  (05/28/2013)'fibroids"  . BILATERAL OOPHORECTOMY Bilateral 1987  . CHOLECYSTECTOMY  1978  . COLONOSCOPY N/A 05/31/2013   Procedure: COLONOSCOPY;  Surgeon: Wonda Horner, MD;  Location[facility] ENDOSCOPY;  Service: Endoscopy;  Laterality: N/A;  . ESOPHAGOGASTRODUODENOSCOPY (EGD) WITH PROPOFOL N/A 05/11/2014   Procedure: ESOPHAGOGASTRODUODENOSCOPY (EGD) WITH PROPOFOL;  Surgeon: Lear Ng, MD;  Location: [facility] ENDOSCOPY;  Service: Endoscopy;  Laterality: N/A;  egd first  . EUS N/A 06/21/2016   Procedure: ESOPHAGEAL ENDOSCOPIC ULTRASOUND (EUS) RADIAL;  Surgeon: Arta Silence, MD;  Location: [facility] ENDOSCOPY;  Service: Endoscopy;  Laterality: N/A;  . FLEXIBLE SIGMOIDOSCOPY N/A 05/11/2014   Procedure: FLEXIBLE SIGMOIDOSCOPY;  Surgeon: Lear Ng, MD;  Location: [facility] ENDOSCOPY;  Service: Endoscopy;  Laterality: N/A;  . TUBAL LIGATION      SOCHx:  reports that she has never smoked. She has never used smokeless tobacco. She reports that she drinks alcohol. She reports that she does not use drugs.  FAMHx: Family History  Problem Relation Age of Onset  . Colon cancer Brother     ALLERGIES: Allergies  Allergen Reactions  . Aspirin Nausea Only  . Darvocet [Propoxyphene N-Acetaminophen] Nausea And Vomiting  . Isometheptene-Dichloral-Apap Nausea Only  . Latex Other (See Comments)    Peels skin  . Nsaids Other (See Comments)    HAS COLITIS FLARES  . Penicillins Hives    Has  patient had a PCN reaction causing immediate rash, facial/tongue/throat swelling, SOB or lightheadedness with hypotension: Yes Has patient had a PCN reaction causing severe rash involving mucus membranes or skin necrosis: Unknown Has patient had a PCN reaction that required hospitalization: Unknown Has patient had a PCN reaction occurring within the last 10 years: No If all of the above answers are "NO", then may proceed with Cephalosporin use.   . Tape Hives  . Toradol [Ketorolac Tromethamine] Nausea And Vomiting  .  Tylenol [Acetaminophen] Nausea Only  . Wellbutrin [Bupropion Hcl] Other (See Comments)    Insomnia and headaches  . Dilaudid [Hydromorphone Hcl] Itching, Nausea Only and Rash    ROS: Review of Systems: General: negative for chills, fever, night sweats or weight changes.  Cardiovascular: negative for chest pain, dyspnea on exertion, edema, orthopnea, palpitations, paroxysmal nocturnal dyspnea or shortness of breath HEENT: negative for any visual disturbances, blindness, glaucoma Dermatological: negative for rash Respiratory: negative for cough, hemoptysis, or wheezing Urologic: negative for hematuria or dysuria Abdominal: negative for nausea, vomiting, diarrhea, bright red blood per rectum, melena, or hematemesis Neurologic: negative for visual changes, syncope, or dizziness Musculoskeletal: negative for back pain, joint pain, or swelling Psych: cooperative and appropriate All other systems reviewed and are otherwise negative except as noted above.   HOME MEDICATIONS: Prior to Admission medications   Medication Sig Start Date End Date Taking? Authorizing Provider  cyclobenzaprine (FLEXERIL) 5 MG tablet Take 1 tablet (5 mg total) by mouth 3 (three) times daily as needed for muscle spasms. 10/01/14  Yes Billy Fischer, MD  diazepam (VALIUM) 5 MG tablet Take 5 mg by mouth daily as needed for anxiety.  02/06/16  Yes Historical Provider, MD  dicyclomine (BENTYL) 20 MG tablet Take 1 tablet (20 mg total) by mouth 4 (four) times daily -  before meals and at bedtime. 06/12/16  Yes Antonietta Breach, PA-C  FLUoxetine (PROZAC) 20 MG capsule Take 20 mg by mouth every morning.   Yes Historical Provider, MD  levETIRAcetam (KEPPRA XR) 500 MG 24 hr tablet Take 500 mg by mouth 2 (two) times daily.    Yes Historical Provider, MD  LORazepam (ATIVAN) 1 MG tablet Take 1 tablet (1 mg total) by mouth 3 (three) times daily as needed for anxiety. 11/18/14  Yes [name] Laisure, PA-C  mesalamine (ROWASA) 4 G enema Place 4 g  rectally at bedtime as needed (colitis flare up.).    Yes Historical Provider, MD  Multiple Vitamins-Minerals (CENTRUM SILVER 50+WOMEN) TABS Take 1 tablet by mouth every morning.   Yes Historical Provider, MD  ondansetron (ZOFRAN) 4 MG tablet Take 1 tablet (4 mg total) by mouth every 6 (six) hours. 01/19/15  Yes Ezequiel Essex, MD  Oxycodone HCl 10 MG TABS Take 10 mg by mouth 3 (three) times daily as needed (for pain).  02/22/16  Yes Historical Provider, MD  Propylene Glycol (SYSTANE BALANCE OP) Place 2 drops into both eyes 2 (two) times daily as needed (dry eyes).    Yes Historical Provider, MD  triamterene-hydrochlorothiazide (MAXZIDE-25) 37.5-25 MG per tablet Take 1 tablet by mouth every morning.  06/16/14  Yes Historical Provider, MD  naproxen (NAPROSYN) 500 MG tablet Take 1 tablet (500 mg total) by mouth 2 (two) times daily as needed for mild pain, moderate pain or headache (TAKE WITH MEALS.). Patient not taking: Reported on 06/29/2016 11/05/14   Mercedes Camprubi-Soms, PA-C  promethazine (PHENERGAN) 25 MG tablet Take 1 tablet (25 mg total) by mouth every 6 (six) hours as  needed for nausea or vomiting. Patient not taking: Reported on 06/29/2016 06/12/16   Antonietta Breach, [place]: I have reviewed the patient's current medications.  VITALS: Blood pressure (!) 127/58, pulse 79, temperature 97.7 F (36.5 C), temperature source Oral, resp. rate 18, height 5\' 4"  (1.626 m), weight 225 lb 9.6 oz (102.3 kg), SpO2 98 %.  PHYSICAL EXAM: General appearance: alert, cooperative and no distress Neck: no carotid bruit and no JVD Lungs: clear to auscultation bilaterally Heart: regular rate and rhythm Abdomen: soft, non-tender; bowel sounds normal; no masses,  no organomegaly Extremities: extremities normal, atraumatic, no cyanosis or edema Pulses: 2+ and symmetric Skin: Skin color, texture, turgor normal. No rashes or lesions Neurologic: Grossly normal  LABS: Results for orders placed or  performed during the hospital encounter of 06/29/16 (from the past 24 hour(s))  CBG monitoring, ED     Status: Abnormal   Collection Time: 06/29/16  3:05 PM  Result Value Ref Range   Glucose-Capillary 105 (H) 65 - 99 mg/dL  Basic metabolic panel     Status: Abnormal   Collection Time: 06/29/16  3:09 PM  Result Value Ref Range   Sodium 135 135 - 145 mmol/L   Potassium 3.7 3.5 - 5.1 mmol/L   Chloride 104 101 - 111 mmol/L   CO2 21 (L) 22 - 32 mmol/L   Glucose, Bld 104 (H) 65 - 99 mg/dL   BUN 9 6 - 20 mg/dL   Creatinine, Ser 0.62 0.44 - 1.00 mg/dL   Calcium 9.8 8.9 - 10.3 mg/dL   GFR calc non Af Amer >60 >60 mL/min   GFR calc Af Amer >60 >60 mL/min   Anion gap 10 5 - 15  CBC     Status: Abnormal   Collection Time: 06/29/16  3:09 PM  Result Value Ref Range   WBC 5.4 4.0 - 10.5 K/uL   RBC 5.53 (H) 3.87 - 5.11 MIL/uL   Hemoglobin 14.4 12.0 - 15.0 g/dL   HCT 45.2 36.0 - 46.0 %   MCV 81.7 78.0 - 100.0 fL   MCH 26.0 26.0 - 34.0 pg   MCHC 31.9 30.0 - 36.0 g/dL   RDW 15.4 11.5 - 15.5 %   Platelets 346 150 - 400 K/uL  Urinalysis, Routine w reflex microscopic     Status: None   Collection Time: 06/29/16  5:33 PM  Result Value Ref Range   Color, Urine YELLOW YELLOW   APPearance CLEAR CLEAR   Specific Gravity, Urine 1.014 1.005 - 1.030   pH 7.0 5.0 - 8.0   Glucose, UA NEGATIVE NEGATIVE mg/dL   Hgb urine dipstick NEGATIVE NEGATIVE   Bilirubin Urine NEGATIVE NEGATIVE   Ketones, ur NEGATIVE NEGATIVE mg/dL   Protein, ur NEGATIVE NEGATIVE mg/dL   Nitrite NEGATIVE NEGATIVE   Leukocytes, UA NEGATIVE NEGATIVE  D-dimer, quantitative (not at [facility])     Status: Abnormal   Collection Time: 06/30/16  2:32 AM  Result Value Ref Range   D-Dimer, Quant 0.57 (H) 0.00 - 0.50 ug/mL-FEU  Lipase, blood     Status: None   Collection Time: 06/30/16  2:32 AM  Result Value Ref Range   Lipase 44 11 - 51 U/L  Lipid panel     Status: Abnormal   Collection Time: 06/30/16  2:32 AM  Result Value Ref Range    Cholesterol 222 (H) 0 - 200 mg/dL   Triglycerides 134 <150 mg/dL   HDL 60 >40 mg/dL   Total CHOL/HDL  Ratio 3.7 RATIO   VLDL 27 0 - 40 mg/dL   LDL Cholesterol 135 (H) 0 - 99 mg/dL  Troponin I (q 6hr x 3)     Status: None   Collection Time: 06/30/16  2:32 AM  Result Value Ref Range   Troponin I <0.03 <0.03 ng/mL  Glucose, capillary     Status: None   Collection Time: 06/30/16  6:04 AM  Result Value Ref Range   Glucose-Capillary 96 65 - 99 mg/dL  Troponin I (q 6hr x 3)     Status: None   Collection Time: 06/30/16  8:01 AM  Result Value Ref Range   Troponin I <0.03 <0.03 ng/mL  Basic metabolic panel     Status: Abnormal   Collection Time: 06/30/16  8:01 AM  Result Value Ref Range   Sodium 140 135 - 145 mmol/L   Potassium 3.6 3.5 - 5.1 mmol/L   Chloride 107 101 - 111 mmol/L   CO2 27 22 - 32 mmol/L   Glucose, Bld 88 65 - 99 mg/dL   BUN 10 6 - 20 mg/dL   Creatinine, Ser 0.68 0.44 - 1.00 mg/dL   Calcium 8.7 (L) 8.9 - 10.3 mg/dL   GFR calc non Af Amer >60 >60 mL/min   GFR calc Af Amer >60 >60 mL/min   Anion gap 6 5 - 15  CBC     Status: Abnormal   Collection Time: 06/30/16  8:01 AM  Result Value Ref Range   WBC 4.9 4.0 - 10.5 K/uL   RBC 4.85 3.87 - 5.11 MIL/uL   Hemoglobin 12.4 12.0 - 15.0 g/dL   HCT 40.1 36.0 - 46.0 %   MCV 82.7 78.0 - 100.0 fL   MCH 25.6 (L) 26.0 - 34.0 pg   MCHC 30.9 30.0 - 36.0 g/dL   RDW 15.8 (H) 11.5 - 15.5 %   Platelets 282 150 - 400 K/uL    EKG: NSR, QTc 450  IMAGING: Dg Chest 1 View  Result Date: 06/29/2016 CLINICAL DATA:  60 year old female with syncope EXAM: CHEST 1 VIEW COMPARISON:  Chest radiograph dated 04/17/2015 FINDINGS: The heart size and mediastinal contours are within normal limits. Both lungs are clear. The visualized skeletal structures are unremarkable. IMPRESSION: No active disease. Electronically Signed   By: Anner Crete M.D.   On: 06/29/2016 20:55   Ct Head Wo Contrast  Result Date: 06/29/2016 CLINICAL DATA:  left frontal  abrasion and hematoma. EXAM: CT HEAD WITHOUT CONTRAST CT CERVICAL SPINE WITHOUT CONTRAST TECHNIQUE: Multidetector CT imaging of the head and cervical spine was performed following the standard protocol without intravenous contrast. Multiplanar CT image reconstructions of the cervical spine were also generated. COMPARISON:  03/20/2016 FINDINGS: CT HEAD FINDINGS Brain: No evidence of acute infarction, hemorrhage, hydrocephalus, extra-axial collection or mass lesion/mass effect. Vascular: No hyperdense vessel or unexpected calcification. Skull: Normal. Negative for fracture or focal lesion. Sinuses/Orbits: No acute finding. Other: None CT CERVICAL SPINE FINDINGS Alignment: Straightening of normal cervical lordosis. Skull base and vertebrae: No acute fracture. No primary bone lesion or focal pathologic process. Soft tissues and spinal canal: No prevertebral fluid or swelling. No visible canal hematoma. Disc levels: Multi level disc space narrowing and ventral endplate spurring is identified. This is most advanced at C4-5 C5-6 and C6-7. Upper chest: No mediastinal mass identified. The lung apices appear clear. Other: None IMPRESSION: 1. No acute intracranial abnormalities. 2. Cervical spondylosis.  No evidence for fracture or subluxation. Electronically Signed   By: Lovena Le  Clovis Riley M.D.   On: 06/29/2016 17:54   Ct Angio Chest Pe W Or Wo Contrast  Result Date: 06/30/2016 CLINICAL DATA:  Status post syncope and fall. Generalized chest pressure. Initial encounter. EXAM: CT ANGIOGRAPHY CHEST WITH CONTRAST TECHNIQUE: Multidetector CT imaging of the chest was performed using the standard protocol during bolus administration of intravenous contrast. Multiplanar CT image reconstructions and MIPs were obtained to evaluate the vascular anatomy. CONTRAST:  100 mL of Isovue 370 IV contrast COMPARISON:  Chest radiograph performed 06/29/2016 FINDINGS: There is no evidence of pulmonary embolus. Minimal bibasilar atelectasis is  noted. The lungs are otherwise clear. There is no evidence of significant focal consolidation, pleural effusion or pneumothorax. No masses are identified; no abnormal focal contrast enhancement is seen. The mediastinum is unremarkable appearance. No mediastinal lymphadenopathy is seen. No pericardial effusion is identified. The great vessels are grossly unremarkable in appearance. No axillary lymphadenopathy is seen. The thyroid gland is unremarkable in appearance. The visualized portions of the liver and spleen are unremarkable. The visualized portions of the pancreas, stomach, adrenal glands and kidneys are within normal limits. No acute osseous abnormalities are seen. Review of the MIP images confirms the above findings. IMPRESSION: 1. No evidence of pulmonary embolus. 2. Minimal bibasilar atelectasis noted.  Lungs otherwise clear. Electronically Signed   By: Garald Balding M.D.   On: 06/30/2016 05:06   Ct Cervical Spine Wo Contrast  Result Date: 06/29/2016 CLINICAL DATA:  left frontal abrasion and hematoma. EXAM: CT HEAD WITHOUT CONTRAST CT CERVICAL SPINE WITHOUT CONTRAST TECHNIQUE: Multidetector CT imaging of the head and cervical spine was performed following the standard protocol without intravenous contrast. Multiplanar CT image reconstructions of the cervical spine were also generated. COMPARISON:  03/20/2016 FINDINGS: CT HEAD FINDINGS Brain: No evidence of acute infarction, hemorrhage, hydrocephalus, extra-axial collection or mass lesion/mass effect. Vascular: No hyperdense vessel or unexpected calcification. Skull: Normal. Negative for fracture or focal lesion. Sinuses/Orbits: No acute finding. Other: None CT CERVICAL SPINE FINDINGS Alignment: Straightening of normal cervical lordosis. Skull base and vertebrae: No acute fracture. No primary bone lesion or focal pathologic process. Soft tissues and spinal canal: No prevertebral fluid or swelling. No visible canal hematoma. Disc levels: Multi level disc  space narrowing and ventral endplate spurring is identified. This is most advanced at C4-5 C5-6 and C6-7. Upper chest: No mediastinal mass identified. The lung apices appear clear. Other: None IMPRESSION: 1. No acute intracranial abnormalities. 2. Cervical spondylosis.  No evidence for fracture or subluxation. Electronically Signed   By: Kerby Moors M.D.   On: 06/29/2016 17:54   Mri Brain Without Contrast  Result Date: 06/29/2016 CLINICAL DATA:  Initial evaluation for acute syncope, chest pain, with left-sided weakness and facial twitching. EXAM: MRI HEAD WITHOUT CONTRAST TECHNIQUE: Multiplanar, multiecho pulse sequences of the brain and surrounding structures were obtained without intravenous contrast. COMPARISON:  Prior CT from earlier the same day. FINDINGS: Cerebral volume within normal limits for patient age. No focal parenchymal signal abnormality identified. No abnormal foci of restricted diffusion to suggest acute or subacute ischemia. Gray-white matter differentiation well maintained. Major intracranial vascular flow voids are preserved. No acute or chronic intracranial hemorrhage. No areas of chronic infarction. No mass lesion, midline shift or mass effect. No hydrocephalus. No extra-axial fluid collection. Major dural sinuses are grossly patent. Craniocervical junction normal. Mild degenerative disc bulging noted at C2-3 without significant stenosis. Remainder the visualized upper cervical spine otherwise unremarkable. Pituitary gland within normal limits parent no acute abnormality about the globes and  orbits. Paranasal sinuses are clear. No mastoid effusion. Inner ear structures grossly normal. Bone marrow signal intensity within normal limits. No scalp soft tissue abnormality. IMPRESSION: Normal brain MRI. Electronically Signed   By: Jeannine Boga M.D.   On: 06/29/2016 22:14    IMPRESSION: Principal Problem:   Syncope and collapse Active Problems:   Chest pain   HTN (hypertension),  benign   Seizure (HCC)   Depression   Chronic back pain   Ulcerative colitis (HCC)   Left-sided weakness   Facial twitching   IBS (irritable bowel syndrome)   RECOMMENDATION: Echo just completed. Check orthostatic VS. OP event monitor. MD to see.   Time Spent Directly with Patient: 40 minutes  Kerin Ransom, [place] beeper 06/30/2016, 1:51 PM   History and all data above reviewed.  Patient examined.  I agree with the findings as above.  The patient gives a vague history chest pain.  She gives a vague history as well of syncope.  She reports two episodes unwitnessed.  She reports multiple syncopal events in the hospital although her nurses don't know anything about these.  Tele has been fine.  She did have an episode of falling back in the bed when PT tried to get her up.  However, all of her vital signs were OK and again, tele was OK.    The patient exam reveals COR:RRR  ,  Lungs: Clear  ,  Abd: Positive bowel sounds, no rebound no guarding, Ext No edema  .  All available labs, radiology testing, previous records reviewed. Agree with documented assessment and plan. Syncope:  Not clear what these events are.  No obvious cardiac etiology at this point.  Echo is essentially normal.  EKG with non specific changes.  Agree with checking orthostatic BPs and an out patient event.  However, I do not have a strong suspicion that there is a cardiac etiology to her events.     Jeneen Rinks Kelena Garrow  3:36 PM  06/30/2016

## 2016-06-30 NOTE — Progress Notes (Signed)
  Echocardiogram 2D Echocardiogram has been performed.  Erin Wolf 06/30/2016, 1:56 PM

## 2016-06-30 NOTE — Evaluation (Signed)
Occupational Therapy Evaluation Patient Details Name: Erin Wolf MRN: [id number] DOB: 11-Oct-1956 Today's Date: 06/30/2016    History of Present Illness Patient is a 60 yo female admitted 06/29/16 following syncopal episode.  Patient reports chest pain, SOB, Lt-sided weakness, general weakness, facial twitching, N/V.   MRI was negative.  CTA to r/o PE was negative.     PMH:  HTN, HLD, SLE, depression, anxiety, seizures, chronic back and abdominal pain   Clinical Impression   Pt admitted with the above diagnoses and presents with below problem list. Pt will benefit from continued acute OT to address the below listed deficits and maximize independence with basic ADLs prior to d/c to venue below. PTA pt was mod I to likely needing some assistance with ADLs due to weakness and syncope for 1 month per pt report. Pt currently mod A with most ADLs, +2 assist at times. Pt with syncopal episode after sitting EOB <1 minute. No OOB attempted this sessio due to syncope. Session details below.      Follow Up Recommendations  SNF    Equipment Recommendations  Other (comment) (defer to next venue)    Recommendations for Other Services       Precautions / Restrictions Precautions Precautions: Fall Precaution Comments: syncopal episodes Restrictions Weight Bearing Restrictions: No      Mobility Bed Mobility Overal bed mobility: Needs Assistance;+2 for physical assistance Bed Mobility: Supine to Sit;Sit to Supine     Supine to sit: Min guard;HOB elevated Sit to supine: Total assist;+2 for physical assistance   General bed mobility comments: Patient able to move LE's off of bed and raise trunk to sitting with min guard assist for safety.  Cues to scoot to EOB.  Patient began falling to left and posteriorly - eyes shut and non-responsive.  Total assist of +2 to move patient to supine position.  Remained unresponsive for approx 20-30 seconds.  RN called to room.  BP in supine 127/58, HR 90  bpm, O2 sats 95%.  Patient responsive/talkative as PT/OT left room.  Transfers                 General transfer comment: NT    Balance Overall balance assessment: Needs assistance;History of Falls Sitting-balance support: No upper extremity supported;Feet unsupported Sitting balance-Leahy Scale: Fair Sitting balance - Comments: Patient maintaining sitting balance prior to syncopal episode.                                    ADL Overall ADL's : Needs assistance/impaired Eating/Feeding: Set up;Sitting   Grooming: Wash/dry face;Set up Grooming Details (indicate cue type and reason): washed face lying in bed Upper Body Bathing: Moderate assistance;Sitting   Lower Body Bathing: Sitting/lateral leans;Bed level;Moderate assistance   Upper Body Dressing : Moderate assistance;Sitting;Bed level   Lower Body Dressing: Moderate assistance;Sitting/lateral leans;Bed level                 General ADL Comments: Pt completed bed mobility as detailed above. Pt with syncopal episode after sitting EOB <1 minute. Total assist to return to supine. Washed face with left hand lying in bed.     Vision     Perception     Praxis      Pertinent Vitals/Pain Pain Assessment: 0-10 Pain Score: 8  Pain Location: abdomen Pain Descriptors / Indicators: Cramping Pain Intervention(s): Monitored during session;Patient requesting pain meds-RN notified;RN gave pain meds during session  Hand Dominance Right   Extremity/Trunk Assessment Upper Extremity Assessment Upper Extremity Assessment: Generalized weakness (able to wipe face with L hand and use BUE during bed mobilit)   Lower Extremity Assessment Lower Extremity Assessment: Defer to PT evaluation       Communication Communication Communication: No difficulties   Cognition Arousal/Alertness: Awake/alert Behavior During Therapy: Anxious;WFL for tasks assessed/performed Overall Cognitive Status: Within Functional  Limits for tasks assessed                     General Comments       Exercises       Shoulder Instructions      Home Living Family/patient expects to be discharged to:: Private residence Living Arrangements: Spouse/significant other Available Help at Discharge: Family;Available PRN/intermittently Type of Home: House Home Access: Stairs to enter [employer] of Steps: 4 Entrance Stairs-Rails: Right;Left Home Layout: One level     Bathroom Shower/Tub: Teacher, early years/pre: Standard     Home Equipment: Cane - single point          Prior Functioning/Environment Level of Independence: Independent        Comments: Until approx 1 month ago when weakness and syncope started    OT Diagnosis: Generalized weakness   OT Problem List: Decreased strength;Decreased activity tolerance;Impaired balance (sitting and/or standing);Decreased knowledge of use of DME or AE;Decreased knowledge of precautions;Decreased safety awareness;Obesity   OT Treatment/Interventions: Self-care/ADL training;DME and/or AE instruction;Therapeutic activities;Patient/family education;Balance training    OT Goals(Current goals can be found in the care plan section) Acute Rehab OT Goals Patient Stated Goal: To feel better OT Goal Formulation: With patient Time For Goal Achievement: 07/14/16 Potential to Achieve Goals: Good ADL Goals Pt Will Perform Grooming: with supervision;sitting Pt Will Perform Upper Body Bathing: with supervision;sitting Pt Will Perform Lower Body Bathing: with min guard assist;sit to/from stand Pt Will Perform Upper Body Dressing: with supervision;sitting Pt Will Perform Lower Body Dressing: with min guard assist;sit to/from stand;sitting/lateral leans Pt Will Transfer to Toilet: with min assist;ambulating Pt Will Perform Toileting - Clothing Manipulation and hygiene: with min guard assist;sit to/from stand;sitting/lateral leans  OT Frequency: Min  2X/week   Barriers to D/C:            Co-evaluation PT/OT/SLP Co-Evaluation/Treatment: Yes Reason for Co-Treatment: For patient/therapist safety   OT goals addressed during session: ADL's and self-care      End of Session Nurse Communication: Other (comment) (nurse came to room with onset of syncopal episode)  Activity Tolerance: Other (comment) (syncopal episode) Patient left: in bed;with call bell/phone within reach;with bed alarm set   Time: [id number] OT Time Calculation (min): 26 min Charges:  OT General Charges $OT Visit: 1 Procedure OT Evaluation $OT Eval Moderate Complexity: 1 Procedure G-Codes: OT G-codes **NOT FOR INPATIENT CLASS** Functional Assessment Tool Used: clinical judgement Functional Limitation: Self care Self Care Current Status [id number]): At least 40 percent but less than 60 percent impaired, limited or restricted Self Care Goal Status [id number]): At least 1 percent but less than 20 percent impaired, limited or restricted  Hortencia Pilar 06/30/2016, 2:13 PM

## 2016-06-30 NOTE — Progress Notes (Signed)
VASCULAR LAB PRELIMINARY  PRELIMINARY  PRELIMINARY  PRELIMINARY  Bilateral lower extremity venous duplex completed.    Preliminary report:  There is no obvious evidence of DVT or SVT noted in the visualized veins of the bilateral lower extremities.   Gianlucca Szymborski, RVT 06/30/2016, 3:30 PM

## 2016-06-30 NOTE — Progress Notes (Addendum)
PROGRESS NOTE  Erin Wolf  [id number] DOB: December 28, 1955  DOA: 06/29/2016 PCP: Shirline Frees, MD   Brief Narrative:  60 y.o. female with medical history significant of hypertension, hyperlipidemia, GERD, depression, anxiety, ulcerative colitis, SLE, seizure, IBS, HCV, back pain, chronic abdominal pain, who presents with syncope, chest pain, left-sided weakness, facial twitching. Reports an episode of unwitnessed syncope and fall a week prior to this admission and refused to come to the hospital for evaluation. Apparently has been generally weak, poor oral intake, intermittent dizziness and lightheadedness in upright position, reports feeling dizzy and lightheaded prior to passing out on day of admission. Reports intermittent precordial chest pain and palpitations both at rest and with activity. Had an episode of precordial chest pain prior to passing out on day of admission (felt like my 45 year old grandson was sitting on my chest), radiation to left side of neck and left upper arm but no dyspnea, sweating or vomiting. Not entirely clear whether she had left-sided weakness. Chronic abdominal pain. Recent EGD. CTA chest: No PE. MRI brain: No acute findings. Cardiology consulted.   Assessment & Plan:   Principal Problem:   Syncope Active Problems:   Chest pain   HTN (hypertension), benign   Seizure (HCC)   Depression   Chronic back pain   Ulcerative colitis (HCC)   Left-sided weakness   Facial twitching   IBS (irritable bowel syndrome)   Syncope and collapse - Patient had 2 episodes at home a week apart. Gives history of poor oral intake pertaining to chronic abdominal pain. Had an episode in the ED which seemed orthostatic in nature. - Differentials: Orthostatic hypotension, seizures, vasovagal, arrhythmias versus other etiology. - CT head, CTA chest, CT neck and MRI brain without acute findings. Follow EEG. - Telemetry: Sinus rhythm without arrhythmias. - Check orthostatic  blood pressures. - PT has evaluated and recommended SNF.   - Mild frontal scalp bruises  Chest pain - Has both typical and atypical features. - EKG without acute findings. Troponin cycled and negative. - CTA chest negative for PE. - Heart score: 5. States that mother, brother and sister have history of heart disease. Brother apparently had MI in his 75s. - No current chest pain. Cardiology consulted for evaluation. Patient allergic to aspirin. - Follow 2-D echo.  Essential hypertension - Holding Maxide. Check orthostatic blood pressures.  Seizure disorder/left facial twitching - Used to be on Dilantin until a year ago and has been on Keppra. Claims compliance. Reports history of generalized tonic-clonic seizure. No known seizures for approximately a year. Does not drive. - Continue Keppra. Check EEG. - MRI brain: Negative. - Discuss with Neurohospitalist on 9/10: Given history of seizures, presentation with facial twitching, concern of seizures as a possible cause for her passing out, relatively low risk in increasing Keppra XR dose to 500 mg in am and 1000 mg at night, recommend increasing Keppra dose to that. - Twitching seems to have resolved.  Anxiety and depression - Patient states that she has been stressed since the beginning of this year. Her brother was shot and killed in February and lost her sister couple months ago due to sepsis. - Some of her history, symptoms and exam findings seem to have a functional overlay  - no suicidal or homicidal ideations reported - Continue when necessary Ativan and Prozac. When necessary Valium held.  Chronic back pain  - Controlled   Chronic abdominal pain/ulcerative colitis/IBS - Gives history of chronic abdominal pain and diarrhea which is at baseline. -  EUS by Dr. Paulita Fujita 06/21/16 without acute findings.  Hyperlipidemia     DVT prophylaxis: Lovenox Code Status: Full Family Communication: None at bedside Disposition Plan: DC to SNF  when medically ready   Consultants:   Cardiology  Procedures:   None  Antimicrobials:   None    Subjective: No chest pain or dyspnea. She is not sure if she had left sided weakness last night or it was related to CP radiation. No twitching. Per RN, no acute issues.  Objective:  Vitals:   06/30/16 0534 06/30/16 1003 06/30/16 1150 06/30/16 1200  BP: 110/61 107/60  (!) 127/58  Pulse: 80 87 90 79  Resp: 16 18    Temp: 97.6 F (36.4 C) 97.7 F (36.5 C)    TempSrc: Oral Oral    SpO2: 99% 99% 95% 98%  Weight:      Height:        Intake/Output Summary (Last 24 hours) at 06/30/16 1311 Last data filed at 06/30/16 0800  Gross per 24 hour  Intake                0 ml  Output              300 ml  Net             -300 ml   Filed Weights   06/29/16 1506 06/29/16 2234  Weight: 102.1 kg (225 lb) 102.3 kg (225 lb 9.6 oz)    Examination:  General exam: Pleasant middle-aged female lying comfortably propped up in bed. Forehead dressing clean and dry. Respiratory system: Clear to auscultation. Respiratory effort normal. Cardiovascular system: S1 & S2 heard, RRR. No JVD, murmurs, rubs, gallops or clicks. No pedal edema. telemetry: Sinus rhythm.  Gastrointestinal system: Abdomen is nondistended, soft and nontender. No organomegaly or masses felt. Normal bowel sounds heard. Central nervous system: Alert and oriented. No focal neurological deficits. Extremities: Patient seems to have normal symmetric power in all extremities but there seems to be a significant functional overlay and does not seem to be providing adequate effort.  Skin: No rashes, lesions or ulcers Psychiatry: Judgement and insight appear normal. Mood & affect: Anxious    Data Reviewed: I have personally reviewed following labs and imaging studies  CBC:  Recent Labs Lab 06/29/16 1509 06/30/16 0801  WBC 5.4 4.9  HGB 14.4 12.4  HCT 45.2 40.1  MCV 81.7 82.7  PLT 346 [id number]   Basic Metabolic Panel:  Recent  Labs Lab 06/29/16 1509 06/30/16 0801  NA 135 140  K 3.7 3.6  CL 104 107  CO2 21* 27  GLUCOSE 104* 88  BUN 9 10  CREATININE 0.62 0.68  CALCIUM 9.8 8.7*   GFR: Estimated Creatinine Clearance: 87 mL/min (by C-G formula based on SCr of 0.8 mg/dL). Liver Function Tests: No results for input(s): AST, ALT, ALKPHOS, BILITOT, PROT, ALBUMIN in the last 168 hours.  Recent Labs Lab 06/30/16 0232  LIPASE 44   No results for input(s): AMMONIA in the last 168 hours. Coagulation Profile: No results for input(s): INR, PROTIME in the last 168 hours. Cardiac Enzymes:  Recent Labs Lab 06/30/16 0232 06/30/16 0801  TROPONINI <0.03 <0.03   BNP (last 3 results) No results for input(s): PROBNP in the last 8760 hours. HbA1C: No results for input(s): HGBA1C in the last 72 hours. CBG:  Recent Labs Lab 06/29/16 1505 06/30/16 0604  GLUCAP 105* 96   Lipid Profile:  Recent Labs  06/30/16 0232  CHOL 222*  HDL 60  LDLCALC 135*  TRIG 134  CHOLHDL 3.7   Thyroid Function Tests: No results for input(s): TSH, T4TOTAL, FREET4, T3FREE, THYROIDAB in the last 72 hours. Anemia Panel: No results for input(s): VITAMINB12, FOLATE, FERRITIN, TIBC, IRON, RETICCTPCT in the last 72 hours.  Sepsis Labs: No results for input(s): PROCALCITON, LATICACIDVEN in the last 168 hours.  No results found for this or any previous visit (from the past 240 hour(s)).       Radiology Studies: Dg Chest 1 View  Result Date: 06/29/2016 CLINICAL DATA:  60 year old female with syncope EXAM: CHEST 1 VIEW COMPARISON:  Chest radiograph dated 04/17/2015 FINDINGS: The heart size and mediastinal contours are within normal limits. Both lungs are clear. The visualized skeletal structures are unremarkable. IMPRESSION: No active disease. Electronically Signed   By: Anner Crete M.D.   On: 06/29/2016 20:55   Ct Head Wo Contrast  Result Date: 06/29/2016 CLINICAL DATA:  left frontal abrasion and hematoma. EXAM: CT HEAD  WITHOUT CONTRAST CT CERVICAL SPINE WITHOUT CONTRAST TECHNIQUE: Multidetector CT imaging of the head and cervical spine was performed following the standard protocol without intravenous contrast. Multiplanar CT image reconstructions of the cervical spine were also generated. COMPARISON:  03/20/2016 FINDINGS: CT HEAD FINDINGS Brain: No evidence of acute infarction, hemorrhage, hydrocephalus, extra-axial collection or mass lesion/mass effect. Vascular: No hyperdense vessel or unexpected calcification. Skull: Normal. Negative for fracture or focal lesion. Sinuses/Orbits: No acute finding. Other: None CT CERVICAL SPINE FINDINGS Alignment: Straightening of normal cervical lordosis. Skull base and vertebrae: No acute fracture. No primary bone lesion or focal pathologic process. Soft tissues and spinal canal: No prevertebral fluid or swelling. No visible canal hematoma. Disc levels: Multi level disc space narrowing and ventral endplate spurring is identified. This is most advanced at C4-5 C5-6 and C6-7. Upper chest: No mediastinal mass identified. The lung apices appear clear. Other: None IMPRESSION: 1. No acute intracranial abnormalities. 2. Cervical spondylosis.  No evidence for fracture or subluxation. Electronically Signed   By: Kerby Moors M.D.   On: 06/29/2016 17:54   Ct Angio Chest Pe W Or Wo Contrast  Result Date: 06/30/2016 CLINICAL DATA:  Status post syncope and fall. Generalized chest pressure. Initial encounter. EXAM: CT ANGIOGRAPHY CHEST WITH CONTRAST TECHNIQUE: Multidetector CT imaging of the chest was performed using the standard protocol during bolus administration of intravenous contrast. Multiplanar CT image reconstructions and MIPs were obtained to evaluate the vascular anatomy. CONTRAST:  100 mL of Isovue 370 IV contrast COMPARISON:  Chest radiograph performed 06/29/2016 FINDINGS: There is no evidence of pulmonary embolus. Minimal bibasilar atelectasis is noted. The lungs are otherwise clear.  There is no evidence of significant focal consolidation, pleural effusion or pneumothorax. No masses are identified; no abnormal focal contrast enhancement is seen. The mediastinum is unremarkable appearance. No mediastinal lymphadenopathy is seen. No pericardial effusion is identified. The great vessels are grossly unremarkable in appearance. No axillary lymphadenopathy is seen. The thyroid gland is unremarkable in appearance. The visualized portions of the liver and spleen are unremarkable. The visualized portions of the pancreas, stomach, adrenal glands and kidneys are within normal limits. No acute osseous abnormalities are seen. Review of the MIP images confirms the above findings. IMPRESSION: 1. No evidence of pulmonary embolus. 2. Minimal bibasilar atelectasis noted.  Lungs otherwise clear. Electronically Signed   By: Garald Balding M.D.   On: 06/30/2016 05:06   Ct Cervical Spine Wo Contrast  Result Date: 06/29/2016 CLINICAL DATA:  left frontal abrasion and hematoma. EXAM:  CT HEAD WITHOUT CONTRAST CT CERVICAL SPINE WITHOUT CONTRAST TECHNIQUE: Multidetector CT imaging of the head and cervical spine was performed following the standard protocol without intravenous contrast. Multiplanar CT image reconstructions of the cervical spine were also generated. COMPARISON:  03/20/2016 FINDINGS: CT HEAD FINDINGS Brain: No evidence of acute infarction, hemorrhage, hydrocephalus, extra-axial collection or mass lesion/mass effect. Vascular: No hyperdense vessel or unexpected calcification. Skull: Normal. Negative for fracture or focal lesion. Sinuses/Orbits: No acute finding. Other: None CT CERVICAL SPINE FINDINGS Alignment: Straightening of normal cervical lordosis. Skull base and vertebrae: No acute fracture. No primary bone lesion or focal pathologic process. Soft tissues and spinal canal: No prevertebral fluid or swelling. No visible canal hematoma. Disc levels: Multi level disc space narrowing and ventral endplate  spurring is identified. This is most advanced at C4-5 C5-6 and C6-7. Upper chest: No mediastinal mass identified. The lung apices appear clear. Other: None IMPRESSION: 1. No acute intracranial abnormalities. 2. Cervical spondylosis.  No evidence for fracture or subluxation. Electronically Signed   By: Kerby Moors M.D.   On: 06/29/2016 17:54   Mri Brain Without Contrast  Result Date: 06/29/2016 CLINICAL DATA:  Initial evaluation for acute syncope, chest pain, with left-sided weakness and facial twitching. EXAM: MRI HEAD WITHOUT CONTRAST TECHNIQUE: Multiplanar, multiecho pulse sequences of the brain and surrounding structures were obtained without intravenous contrast. COMPARISON:  Prior CT from earlier the same day. FINDINGS: Cerebral volume within normal limits for patient age. No focal parenchymal signal abnormality identified. No abnormal foci of restricted diffusion to suggest acute or subacute ischemia. Gray-white matter differentiation well maintained. Major intracranial vascular flow voids are preserved. No acute or chronic intracranial hemorrhage. No areas of chronic infarction. No mass lesion, midline shift or mass effect. No hydrocephalus. No extra-axial fluid collection. Major dural sinuses are grossly patent. Craniocervical junction normal. Mild degenerative disc bulging noted at C2-3 without significant stenosis. Remainder the visualized upper cervical spine otherwise unremarkable. Pituitary gland within normal limits parent no acute abnormality about the globes and orbits. Paranasal sinuses are clear. No mastoid effusion. Inner ear structures grossly normal. Bone marrow signal intensity within normal limits. No scalp soft tissue abnormality. IMPRESSION: Normal brain MRI. Electronically Signed   By: Jeannine Boga M.D.   On: 06/29/2016 22:14        Scheduled Meds: . dicyclomine  20 mg Oral TID AC & HS  . enoxaparin (LOVENOX) injection  40 mg Subcutaneous Q24H  . FLUoxetine  20 mg  Oral q morning - 10a  . levETIRAcetam  500 mg Oral BID  . multivitamin with minerals   Oral q morning - 10a  . sodium chloride flush  3 mL Intravenous Q12H   Continuous Infusions: . sodium chloride 125 mL/hr at 06/29/16 2352  . sodium chloride 20 mL/hr at 06/30/16 0754     LOS: 0 days    Time spent: 45 minutes.    [facility], MD [facility] Pager [phone]  If 7PM-7AM, please contact night-coverage [email or web address] Password [email or web address] 06/30/2016, 1:11 PM

## 2016-07-01 ENCOUNTER — Inpatient Hospital Stay (HOSPITAL_COMMUNITY): Payer: Medicare Other

## 2016-07-01 DIAGNOSIS — R072 Precordial pain: Secondary | ICD-10-CM

## 2016-07-01 LAB — HEMOGLOBIN A1C
Hgb A1c MFr Bld: 6.4 % — ABNORMAL HIGH (ref 4.8–5.6)
Mean Plasma Glucose: 137 mg/dL

## 2016-07-01 LAB — LEVETIRACETAM LEVEL: LEVETIRACETAM: NOT DETECTED ug/mL (ref 10.0–40.0)

## 2016-07-01 MED ORDER — LEVETIRACETAM ER 500 MG PO TB24: 500.0000 mg | ORAL_TABLET | Freq: Two times a day (BID) | ORAL | 0 refills | Status: DC

## 2016-07-01 NOTE — Progress Notes (Signed)
Alba Cory to be D/C'd home per MD order.  Discussed prescriptions and follow up appointments with the patient. Prescriptions given to patient, medication list explained in detail. Pt verbalized understanding.    Medication List    STOP taking these medications   diazepam 5 MG tablet Commonly known as:  VALIUM   naproxen 500 MG tablet Commonly known as:  NAPROSYN   promethazine 25 MG tablet Commonly known as:  PHENERGAN   triamterene-hydrochlorothiazide 37.5-25 MG tablet Commonly known as:  MAXZIDE-25     TAKE these medications   CENTRUM SILVER 50+WOMEN Tabs Take 1 tablet by mouth every morning.   cyclobenzaprine 5 MG tablet Commonly known as:  FLEXERIL Take 1 tablet (5 mg total) by mouth 3 (three) times daily as needed for muscle spasms.   dicyclomine 20 MG tablet Commonly known as:  BENTYL Take 1 tablet (20 mg total) by mouth 4 (four) times daily -  before meals and at bedtime.   FLUoxetine 20 MG capsule Commonly known as:  PROZAC Take 20 mg by mouth every morning.   levETIRAcetam 500 MG 24 hr tablet Commonly known as:  KEPPRA XR Take 1-2 tablets (500-1,000 mg total) by mouth 2 (two) times daily. Take 1 tablet in the mornings and 2 tabs at bedtime. What changed:  how much to take  additional instructions   LORazepam 1 MG tablet Commonly known as:  ATIVAN Take 1 tablet (1 mg total) by mouth 3 (three) times daily as needed for anxiety.   mesalamine 4 g enema Commonly known as:  ROWASA Place 4 g rectally at bedtime as needed (colitis flare up.).   ondansetron 4 MG tablet Commonly known as:  ZOFRAN Take 1 tablet (4 mg total) by mouth every 6 (six) hours.   Oxycodone HCl 10 MG Tabs Take 10 mg by mouth 3 (three) times daily as needed (for pain).   SYSTANE BALANCE OP Place 2 drops into both eyes 2 (two) times daily as needed (dry eyes).       Vitals:   07/01/16 0516 07/01/16 1402  BP: (!) 96/57 110/71  Pulse: 81 89  Resp: 18 18  Temp: 97.6 F  (36.4 C) 97.6 F (36.4 C)    Skin clean, dry and intact without evidence of skin break down, no evidence of skin tears noted. IV catheter discontinued intact. Site without signs and symptoms of complications. Dressing and pressure applied. Pt denies pain at this time. No complaints noted.  An After Visit Summary was printed and given to the patient. Patient escorted via [place], and D/C home via private auto.  Dixie Dials, BSN, RN

## 2016-07-01 NOTE — Progress Notes (Signed)
Pt. Was able to to sit and stand this morning with ease while getting her orthostatic BP's. Pt. walked to bathroom with four wheel walker and no assistance. Pt. Had moved to chair with little assistance and steady on feet. Later when assisting patient to get from chair to bed for transport Pt. Pivot well, but then closed eyes after sitting on bed while attempting to go limp while only Anisha RN held her arm. Pt. Kept her body supported. As I proceeded to tell her to get in bed she opened her eyes and said what would you like me to do. This is what was described as her syncope episodes, MD was made aware no new orders placed.

## 2016-07-01 NOTE — Progress Notes (Signed)
Physical Therapy Treatment Patient Details Name: Erin Wolf MRN: [id number] DOB: 1956-04-08 Today's Date: 07/01/2016    History of Present Illness Patient is a 60 yo female admitted 06/29/16 following syncopal episode.  Patient reports chest pain, SOB, Lt-sided weakness, general weakness, facial twitching, N/V.   MRI was negative.  CTA to r/o PE was negative.     PMH:  HTN, HLD, SLE, depression, anxiety, seizures, chronic back and abdominal pain    PT Comments    Pt mobilizing much better today than yesterday, she was able to ambulate 68' with RW and min-guard A with chair brought closely behind with no syncopal episode. LE's were retested with generalized weakness bilaterally. Orthostatic hypotension precautions were discussed though orthostatics taken this morning looked ok. Pt concerned because her sister was diagnosed with lupus 3 yrs ago with a similar presentation. At this point, recommend RW and HHPT as well as 24 hr supervision at home (husband is planning to take off some work). PT will continue to follow.   Follow Up Recommendations  Home health PT;Supervision/Assistance - 24 hour     Equipment Recommendations  Rolling walker with 5" wheels    Recommendations for Other Services       Precautions / Restrictions Precautions Precautions: Fall Precaution Comments: syncopal episodes Restrictions Weight Bearing Restrictions: No    Mobility  Bed Mobility Overal bed mobility: Modified Independent Bed Mobility: Supine to Sit           General bed mobility comments: pt able to come to EOB without assistance. Once in sitting cued to perform ankle pumps and LAQ before standing  Transfers Overall transfer level: Needs assistance Equipment used: Rolling walker (2 wheeled) Transfers: Sit to/from Stand Sit to Stand: Min guard         General transfer comment: vc's given for pt hand placement. No physical assist given but min-guard for safety due to previous syncopse.  Slow moving but able to come to standing  Ambulation/Gait Ambulation/Gait assistance: +2 safety/equipment;Min guard Ambulation Distance (Feet): 75 Feet Assistive device: Rolling walker (2 wheeled) Gait Pattern/deviations: Step-through pattern;Decreased stride length Gait velocity: decreased Gait velocity interpretation: <1.8 ft/sec, indicative of risk for recurrent falls General Gait Details: chair brought behind pt during ambulation as precaution, as well as consistent min-guard A. Pt with slow gait and reported that she could not go any faster when cued. Weight on RW throughout. No feelings of dizziness or faintness but pt pt did feel weak   Stairs            Wheelchair Mobility    Modified Rankin (Stroke Patients Only)       Balance Overall balance assessment: Needs assistance;History of Falls Sitting-balance support: Feet supported Sitting balance-Leahy Scale: Good     Standing balance support: Bilateral upper extremity supported Standing balance-Leahy Scale: Fair Standing balance comment: pt able to maintain static standing without support but performed dynamic activity with support and reported feeling more confident with use of RW                    Cognition Arousal/Alertness: Awake/alert Behavior During Therapy: WFL for tasks assessed/performed Overall Cognitive Status: Within Functional Limits for tasks assessed                      Exercises General Exercises - Lower Extremity Ankle Circles/Pumps: AROM;Both;10 reps;Seated Long Arc Quad: AROM;Both;10 reps;Seated    General Comments General comments (skin integrity, edema, etc.): Orthostatic's were checked this morning  with 11 point systolic decrease sup to sit but increase with standing. Retesting of leg strength performed and pt with significant weakness bilateral LE's: hip flex 2+/5, knee ext 3+/5, knee flex 3+/5. Pt also reports a rash that is fairly new and is concerned because her twin  was diagnosed with lupus several yrs ago with similar presentation. She is supposed to follow up with a rheumatolgist soon.        Pertinent Vitals/Pain Pain Assessment: Faces Faces Pain Scale: Hurts little more Pain Location: abdominal cramping and nausea Pain Descriptors / Indicators: Cramping Pain Intervention(s): Monitored during session    Home Living                      Prior Function            PT Goals (current goals can now be found in the care plan section) Acute Rehab PT Goals Patient Stated Goal: To feel better PT Goal Formulation: With patient Time For Goal Achievement: 07/14/16 Potential to Achieve Goals: Good Progress towards PT goals: Progressing toward goals    Frequency  Min 3X/week    PT Plan Discharge plan needs to be updated    Co-evaluation             End of Session Equipment Utilized During Treatment: Gait belt Activity Tolerance: Patient tolerated treatment well Patient left: in chair;with call bell/phone within reach;with chair alarm set;with nursing/sitter in room     Time: [id number] PT Time Calculation (min) (ACUTE ONLY): 28 min  Charges:  $Gait Training: 8-22 mins $Therapeutic Activity: 8-22 mins                    G Codes:     Leighton Roach, PT  Acute Rehab Services  [phone]  Leighton Roach 07/01/2016, 10:26 AM

## 2016-07-01 NOTE — Progress Notes (Signed)
Patient Name: Erin Wolf Date of Encounter: 07/01/2016  Principal Problem:   Syncope and collapse Active Problems:   Chest pain   HTN (hypertension), benign   Seizure (HCC)   Depression   Chronic back pain   Ulcerative colitis ([place])   Left-sided weakness   Facial twitching   IBS (irritable bowel syndrome)   Length of Stay: 1  SUBJECTIVE  The patient walked with physical therapy this morning without any symptoms of dizziness, presyncope or syncope. Orthostatics this am normal.   CURRENT MEDS . dicyclomine  20 mg Oral TID AC & HS  . enoxaparin (LOVENOX) injection  40 mg Subcutaneous Q24H  . FLUoxetine  20 mg Oral q morning - 10a  . levETIRAcetam  1,000 mg Oral QHS  . levETIRAcetam  500 mg Oral Daily  . multivitamin with minerals   Oral q morning - 10a  . sodium chloride flush  3 mL Intravenous Q12H    OBJECTIVE  Vitals:   06/30/16 1740 06/30/16 2106 07/01/16 0125 07/01/16 0516  BP:  (!) 102/51 (!) 98/58 (!) 96/57  Pulse:  83 85 81  Resp:  18 19 18   Temp:  97.9 F (36.6 C) 97.2 F (36.2 C) 97.6 F (36.4 C)  TempSrc:  Oral Oral Oral  SpO2: 100% 98% 96% 97%  Weight:   239 lb 9.6 oz (108.7 kg)   Height:        Intake/Output Summary (Last 24 hours) at 07/01/16 1000 Last data filed at 06/30/16 2109  Gross per 24 hour  Intake                0 ml  Output              250 ml  Net             -250 ml   Filed Weights   06/29/16 1506 06/29/16 2234 07/01/16 0125  Weight: 225 lb (102.1 kg) 225 lb 9.6 oz (102.3 kg) 239 lb 9.6 oz (108.7 kg)    PHYSICAL EXAM  General: Pleasant, NAD. Neuro: Alert and oriented X 3. Moves all extremities spontaneously. Psych: Normal affect. HEENT:  Normal  Neck: Supple without bruits or JVD. Lungs:  Resp regular and unlabored, CTA. Heart: RRR no s3, s4, or murmurs. Abdomen: Soft, non-tender, non-distended, BS + x 4.  Extremities: No clubbing, cyanosis or edema. DP/PT/Radials 2+ and equal bilaterally.  Accessory Clinical  Findings  CBC  Recent Labs  06/29/16 1509 06/30/16 0801  WBC 5.4 4.9  HGB 14.4 12.4  HCT 45.2 40.1  MCV 81.7 82.7  PLT 346 [id number]   Basic Metabolic Panel  Recent Labs  06/29/16 1509 06/30/16 0801  NA 135 140  K 3.7 3.6  CL 104 107  CO2 21* 27  GLUCOSE 104* 88  BUN 9 10  CREATININE 0.62 0.68  CALCIUM 9.8 8.7*    Recent Labs  06/30/16 0232  LIPASE 44   Cardiac Enzymes  Recent Labs  06/30/16 0232 06/30/16 0801  TROPONINI <0.03 <0.03   BNP Invalid input(s): POCBNP D-Dimer  Recent Labs  06/30/16 0232  DDIMER 0.57*   Hemoglobin A1C  Recent Labs  06/30/16 0232  HGBA1C 6.4*   Fasting Lipid Panel  Recent Labs  06/30/16 0232  CHOL 222*  HDL 60  LDLCALC 135*  TRIG 134  CHOLHDL 3.7   Radiology/Studies  Dg Chest 1 View  Result Date: 06/29/2016 CLINICAL DATA:  60 year old female with syncope EXAM: CHEST 1 VIEW COMPARISON:  Chest radiograph dated 04/17/2015 FINDINGS: The heart size and mediastinal contours are within normal limits. Both lungs are clear. The visualized skeletal structures are unremarkable. IMPRESSION: No active disease. Electronically Signed   By: Anner Crete M.D.   On: 06/29/2016 20:55   Ct Head Wo Contrast  Result Date: 06/29/2016 CLINICAL DATA:  left frontal abrasion and hematoma. IMPRESSION: 1. No acute intracranial abnormalities. 2. Cervical spondylosis.  No evidence for fracture or subluxation. Electronically Signed   By: Kerby Moors M.D.   On: 06/29/2016 17:54   Ct Angio Chest Pe W Or Wo Contrast  Result Date: 06/30/2016 CLINICAL DATA:  Status post syncope and fall. Generalized chest pressure. Initial encounter.  IMPRESSION: 1. No evidence of pulmonary embolus. 2. Minimal bibasilar atelectasis noted.  Lungs otherwise clear. Electronically Signed   By: Garald Balding M.D.   On: 06/30/2016 05:06   Ct Abdomen Pelvis W Contrast  Result Date: 06/12/2016 CLINICAL DATA:  60 y/o F; abdominal pain with nausea and rectal bleeding.  Patient had a colonoscopy on Friday.  IMPRESSION: 1. No evidence for colonic perforation or inflammatory changes of the bowel. Mild sigmoid diverticulosis without diverticulitis. 2. Marked interval dilatation of the pancreatic duct and the biliary system with a possible mass at the pancreatic ampulla. Correlation with ERCP and/or MRCP is recommended.   TELE: SR  TTE: 06/30/16 Left ventricle: The cavity size was normal. Wall thickness was   normal. Systolic function was normal. The estimated ejection   fraction was in the range of 60% to 65%. Wall motion was normal;   there were no regional wall motion abnormalities. Features are   consistent with a pseudonormal left ventricular filling pattern,   with concomitant abnormal relaxation and increased filling   pressure (grade 2 diastolic dysfunction).    ASSESSMENT AND PLAN  Recurrent syncope with an episode that was witnessed by physical therapy yesterday with lost of consciousness  For 20-30 seconds. BO at the time 120 mmHg. Orthostatics negative despite low BP at baseline. Telemetry at the time of the event normal SR, since then no arrhythmias. LVEF 60-65%. No signs of CHF. No significant valvular abnormalities. She states that she has been weak for 1 month now, her twin sister had similar presentation when she was diagnosed with SLE. She is scheduled to see an outpatient rheumatology. CT, MRI head normal. I would consult neurology to evaluate for a possible seizure, MS or other neurologic diseases.  Signed, Ena Dawley MD, [facility] 07/01/2016

## 2016-07-01 NOTE — Progress Notes (Signed)
Pt. Left and was removed from pyxis befor I could put 10mg  oxy back into pyxis. Med given to [facility] in pharmacy.Marland Kitchen
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
## 2016-07-01 NOTE — Progress Notes (Signed)
EEG completed, results pending. 

## 2016-07-01 NOTE — Care Management Note (Signed)
Case Management Note Marvetta Gibbons RN, BSN Unit 2W-Case Manager [phone]  Patient Details  Name: Erin Wolf MRN: [id number] Date of Birth: [birth year]  Subjective/Objective:  Pt admitted with syncope                Action/Plan: PTA pt lived at home with spouse- plan to return home- per PT eval recommendations for [facility]- orders have been placed for HHPT/aide- and RW- spoke with pt at bedside- list provided for [facility] agency choice- per pt she would like to use [facility] for services- referral called to [place] with [facility]- referral accepted- services to begin within 24-48hr. - spoke with [facility] with [facility] regarding RW DME need- RW to be delivered to room prior to discharge. - pt also expressed concern regarding Medication cost and hospital bill- per pt she has prescription coverage with [employer]- advised pt to call [facility] regarding any option for mail order to reduce cost of medications- pt does not qualify for any assistance due to having medication coverage- regarding bill- advised pt to call number on bill when received to speak with someone regarding bill and ability to pay- pt already on disability.   Expected Discharge Date:     07/01/16            Expected Discharge Plan:  [place]  In-House Referral:     Discharge planning Services  CM Consult  Post Acute Care Choice:  Durable Medical Equipment, Home Health Choice offered to:  Patient  DME Arranged:  [place] rolling DME Agency:  [place] Arranged:  PT, Nurse's Aide [place] Agency:  [place]  Status of Service:  Completed, signed off  If discussed at [place] of Stay Meetings, dates discussed:    Discharge Disposition: Home with Home Health   Additional Comments:  Dawayne Patricia, RN 07/01/2016, 3:26 PM

## 2016-07-01 NOTE — Procedures (Signed)
HPI:  60 y/o with syncope and facial twitching.  Has a reported hx of seizure  TECHNICAL SUMMARY:  A multichannel referential and bipolar montage EEG using the standard international 10-20 system was performed on the patient described as awake, drowsy and asleep.  The dominant background activity consists of excessive amount of fast (beta) activity.  Rarely, 9 Hz activity can be seen overriding in the posterior head regions.  The background is nonreactive to eye opening and closing procedures.  ACTIVATION:  Stepwise photic stimulation at 4-20 flashes per second was performed and did not elicit any abnormal waveforms but did produce a symmetric driving response at low range frequencies.  Hyperventilation was not performed.  EPILEPTIFORM ACTIVITY:  There were no spikes, sharp waves or paroxysmal activity.  SLEEP:  Stage I and stage II sleep architecture identified.  IMPRESSION:  This EEG demonstrated no focal, hemispheric, or lateralizing features.  There was no epileptiform activity recorded.  There was an excessive amount of fast (beta) activity throughout the recording which is usually a result of medication such as benzodiazepines.  Correlate clinically.  If seizure remains high amongst the list of differential diagnoses, an outpatient ambulatory EEG may be of value.

## 2016-07-01 NOTE — Discharge Summary (Signed)
Physician Discharge Summary  Erin Wolf [id number] DOB: [birth year]  PCP: Shirline Frees, MD  Admit date: 06/29/2016 Discharge date: 07/01/2016  Admitted From: Home Disposition:  Home  Recommendations for Outpatient Follow-up:  1. Dr. Shirline Frees, PCP in 3 days. [facility], Ambulatory referral to Neurology was made for an appointment in 2 weeks  Home Health: [facility]. Equipment/Devices: Rolling walker with 5" wheels.    Discharge Condition: Improved and stable.  CODE STATUS: Full  Diet recommendation: Heart Healthy diet.  Discharge Diagnoses:  Principal Problem:   Syncope and collapse Active Problems:   Chest pain   HTN (hypertension), benign   Seizure (HCC)   Depression   Chronic back pain   Ulcerative colitis (HCC)   Left-sided weakness   Facial twitching   IBS (irritable bowel syndrome)   Brief/Interim Summary: 60 y.o.femalewith medical history significant of hypertension, hyperlipidemia, GERD, depression, anxiety, ulcerative colitis, SLE, seizure, IBS, HCV, back pain, chronic abdominal pain, who presented with syncope, chest pain, left-sided weakness, facial twitching. Reports an episode of unwitnessed syncope and fall a week prior to this admission and refused to come to the hospital for evaluation. Apparently has been generally weak, poor oral intake, intermittent dizziness and lightheadedness in upright position, reports feeling dizzy and lightheaded prior to passing out on day of admission. Reports intermittent precordial chest pain and palpitations both at rest and with activity. Had an episode of precordial chest pain prior to passing out on day of admission (felt like my 61 year old grandson was sitting on my chest), radiation to left side of neck and left upper arm but no dyspnea, sweating or vomiting. Not entirely clear whether she had left-sided weakness. Chronic abdominal pain. Recent EUS as OP. CTA chest: No PE. MRI brain: No  acute findings. Cardiology & Neurology consulted.   Assessment & Plan:   Principal Problem:   Syncope Active Problems:   Chest pain   HTN (hypertension), benign   Seizure (HCC)   Depression   Chronic back pain   Ulcerative colitis (HCC)   Left-sided weakness   Facial twitching   IBS (irritable bowel syndrome)   Syncope and collapse - Patient had 2 episodes at home a week apart. Gives history of poor oral intake pertaining to chronic abdominal pain. Had an episode in the ED which seemed orthostatic in nature. - Differentials initially were: Orthostatic hypotension, seizures, vasovagal, arrhythmias, functional versus other etiology. - CT head, CTA chest, CT neck and MRI brain without acute findings. EEG: no seizures. - Telemetry: Sinus rhythm without arrhythmias. - orthostatic blood pressures: no consistent orthostatic changes.. - PT had evaluated and had initially recommended SNF. Requested PT to reassess today: Able to ambulate 75 feet with rolling walker and minimum guard assist. PT now recommends home health PT and rolling walker with 5 inch wheels. Patient states that her spouse will be taking off from work for a few days and will be available to supervise and a sister 24/7. Home health aide arranged. Discussed with case management. - Mild frontal scalp bruises - Cardiology was consulted due to her chest pain and syncope. As per cardiology note and discussion with them, history of recurrent syncope with an episode that was witnessed by PT yesterday with LOC for 20-30 seconds, orthostatics negative, telemetry at the time of event normal sinus rhythm without arrhythmias. 2-D echo: LVEF 60-65 percent. Cardiology does not recommend any further evaluation including an outpatient event monitor. Cardiology did consult neurology who have cleared her for discharge  and recommend outpatient follow-up with neurology in 2 weeks. - As per discussion with patient's RN and cardiology, earlier  today patient was being assisted from wheelchair to her bed when she stood up at the edge of the bed, then closed her eyes and fell back onto the bed but continued to maintain body tone and no seizure-like activity. After about 20 or 30 seconds, she opened her eyes and asked "how did I do?". There seems to be a strong functional overlay to her symptoms/presentation. Offered to speak to her family and updated them on current hospitalization, evaluation and management. She was very defensive and stated that she will let them know and did not want me to call them.  Chest pain - Initially seemed to have both typical and atypical features. - EKG without acute findings. Troponin cycled and negative. - CTA chest negative for PE. Lower extremity venous Dopplers: Negative for DVT. - Heart score: 5. States that mother, brother and sister have history of heart disease. Brother apparently had MI in his 29s. - No current chest pain.  - Cardiology was consulted and were not convinced of a cardiac etiology for her presentation. No further workup was recommended.  Essential hypertension - Maxide was temporarily held in the hospital due to concern for orthostatic hypotension.  -Her blood pressures off of medications are well controlled or on the slightly lower side. DC Maxide at discharge. Reassess during outpatient follow-up with PCP regarding need for resuming antihypertensives.  Seizure disorder/left facial twitching - Used to be on Dilantin until a year ago and has been on Keppra. Claims compliance. Reports history of generalized tonic-clonic seizure. No known seizures for approximately a year. Does not drive. - Continue Keppra. EEG: no seizures. - MRI brain: Negative. - Discussed with Neurohospitalist on 9/10: Given history of seizures, presentation with facial twitching, concern of seizures as a possible cause for her passing out, relatively low risk in increasing Keppra XR dose, recommended increasing  Keppra XR to 500 mg in am and 1000 mg at night. - Twitching resolved and have not recurred. - Neurology has seen today and have cleared her for discharge home with outpatient follow-up with neurology in 2 weeks.   Anxiety and depression - Patient states that she has been stressed since the beginning of this year. Her brother was shot and killed in February and lost her sister couple months ago due to sepsis. - Some of her history, symptoms and exam findings seem to have a functional overlay  - no suicidal or homicidal ideations reported - Continue when necessary Ativan and Prozac. Due to duplication of medications and not having taken Valium since past week, discontinued Valium..  Chronic back pain  - Controlled   Chronic abdominal pain/ulcerative colitis/IBS - Gives history of chronic abdominal pain and diarrhea which is at baseline. - EUS by Dr. Paulita Fujita 06/21/16 without acute findings.  Hyperlipidemia - Consider statins as outpatient. Diet, exercise and weight loss  Glucose intolerance Hemoglobin A1c: 6.4. Recommend initial diet, exercise and weight loss. Follow outpatient.   Consultants:   Cardiology  Neurology  Procedures:   EEG 07/01/2016: IMPRESSION:  This EEG demonstrated no focal, hemispheric, or lateralizing features.  There was no epileptiform activity recorded.  There was an excessive amount of fast (beta) activity throughout the recording which is usually a result of medication such as benzodiazepines.  Correlate clinically.  If seizure remains high amongst the list of differential diagnoses, an outpatient ambulatory EEG may be of value.   2  D Echo 06/30/2016: Study Conclusions  - Left ventricle: The cavity size was normal. Wall thickness was   normal. Systolic function was normal. The estimated ejection   fraction was in the range of 60% to 65%. Wall motion was normal;   there were no regional wall motion abnormalities. Features are   consistent with a  pseudonormal left ventricular filling pattern,   with concomitant abnormal relaxation and increased filling   pressure (grade 2 diastolic dysfunction).   Bilateral lower extremity venous duplex completed.    Preliminary report:  There is no obvious evidence of DVT or SVT noted in the visualized veins of the bilateral lower extremities.    Discharge Instructions  Discharge Instructions    Ambulatory referral to Neurology    Complete by:  As directed   An appointment is requested in approximately: 2 weeks   Call MD for:    Complete by:  As directed   Passing out or seizure like activity.   Call MD for:  extreme fatigue    Complete by:  As directed   Call MD for:  persistant dizziness or light-headedness    Complete by:  As directed   Call MD for:  severe uncontrolled pain    Complete by:  As directed   Diet - low sodium heart healthy    Complete by:  As directed   Driving Restrictions    Complete by:  As directed   Do not drive until cleared by your physician during out patient follow up.   Increase activity slowly    Complete by:  As directed       Medication List    STOP taking these medications   diazepam 5 MG tablet Commonly known as:  VALIUM   naproxen 500 MG tablet Commonly known as:  NAPROSYN   promethazine 25 MG tablet Commonly known as:  PHENERGAN   triamterene-hydrochlorothiazide 37.5-25 MG tablet Commonly known as:  MAXZIDE-25     TAKE these medications   CENTRUM SILVER 50+WOMEN Tabs Take 1 tablet by mouth every morning.   cyclobenzaprine 5 MG tablet Commonly known as:  FLEXERIL Take 1 tablet (5 mg total) by mouth 3 (three) times daily as needed for muscle spasms.   dicyclomine 20 MG tablet Commonly known as:  BENTYL Take 1 tablet (20 mg total) by mouth 4 (four) times daily -  before meals and at bedtime.   FLUoxetine 20 MG capsule Commonly known as:  PROZAC Take 20 mg by mouth every morning.   levETIRAcetam 500 MG 24 hr tablet Commonly known as:   KEPPRA XR Take 1-2 tablets (500-1,000 mg total) by mouth 2 (two) times daily. Take 1 tablet in the mornings and 2 tabs at bedtime. What changed:  how much to take  additional instructions   LORazepam 1 MG tablet Commonly known as:  ATIVAN Take 1 tablet (1 mg total) by mouth 3 (three) times daily as needed for anxiety.   mesalamine 4 g enema Commonly known as:  ROWASA Place 4 g rectally at bedtime as needed (colitis flare up.).   ondansetron 4 MG tablet Commonly known as:  ZOFRAN Take 1 tablet (4 mg total) by mouth every 6 (six) hours.   Oxycodone HCl 10 MG Tabs Take 10 mg by mouth 3 (three) times daily as needed (for pain).   SYSTANE BALANCE OP Place 2 drops into both eyes 2 (two) times daily as needed (dry eyes).      Follow-up Information    Shirline Frees,  MD. Schedule an appointment as soon as possible for a visit in 3 day(s).   Specialty:  Family Medicine Contact information: [place] [address] [place] .   Specialty:  [place] Why:  HHPT/aide- arranged- they will call you to set up home visits Contact information: [address] [place] [address] [phone]        Inc[facility] - Dme [facility] .   Why:  rolling walker arranged- to be delivered to room prior to discharge Contact information: [address] [place] [address] [phone]          Allergies  Allergen Reactions  . Aspirin Nausea Only  . Darvocet [Propoxyphene N-Acetaminophen] Nausea And Vomiting  . Isometheptene-Dichloral-Apap Nausea Only  . Latex Other (See Comments)    Peels skin  . Nsaids Other (See Comments)    HAS COLITIS FLARES  . Penicillins Hives    Has patient had a PCN reaction causing immediate rash, facial/tongue/throat swelling, SOB or lightheadedness with hypotension: Yes Has patient had a PCN reaction causing severe rash involving mucus membranes or skin  necrosis: Unknown Has patient had a PCN reaction that required hospitalization: Unknown Has patient had a PCN reaction occurring within the last 10 years: No If all of the above answers are "NO", then may proceed with Cephalosporin use.   . Tape Hives  . Toradol [Ketorolac Tromethamine] Nausea And Vomiting  . Tylenol [Acetaminophen] Nausea Only  . Wellbutrin [Bupropion Hcl] Other (See Comments)    Insomnia and headaches  . Dilaudid [Hydromorphone Hcl] Itching, Nausea Only and Rash     Procedures/Studies: Dg Chest 1 View  Result Date: 06/29/2016 CLINICAL DATA:  60 year old female with syncope EXAM: CHEST 1 VIEW COMPARISON:  Chest radiograph dated 04/17/2015 FINDINGS: The heart size and mediastinal contours are within normal limits. Both lungs are clear. The visualized skeletal structures are unremarkable. IMPRESSION: No active disease. Electronically Signed   By: Anner Crete M.D.   On: 06/29/2016 20:55   Ct Head Wo Contrast  Result Date: 06/29/2016 CLINICAL DATA:  left frontal abrasion and hematoma. EXAM: CT HEAD WITHOUT CONTRAST CT CERVICAL SPINE WITHOUT CONTRAST TECHNIQUE: Multidetector CT imaging of the head and cervical spine was performed following the standard protocol without intravenous contrast. Multiplanar CT image reconstructions of the cervical spine were also generated. COMPARISON:  03/20/2016 FINDINGS: CT HEAD FINDINGS Brain: No evidence of acute infarction, hemorrhage, hydrocephalus, extra-axial collection or mass lesion/mass effect. Vascular: No hyperdense vessel or unexpected calcification. Skull: Normal. Negative for fracture or focal lesion. Sinuses/Orbits: No acute finding. Other: None CT CERVICAL SPINE FINDINGS Alignment: Straightening of normal cervical lordosis. Skull base and vertebrae: No acute fracture. No primary bone lesion or focal pathologic process. Soft tissues and spinal canal: No prevertebral fluid or swelling. No visible canal hematoma. Disc levels: Multi  level disc space narrowing and ventral endplate spurring is identified. This is most advanced at C4-5 C5-6 and C6-7. Upper chest: No mediastinal mass identified. The lung apices appear clear. Other: None IMPRESSION: 1. No acute intracranial abnormalities. 2. Cervical spondylosis.  No evidence for fracture or subluxation. Electronically Signed   By: Kerby Moors M.D.   On: 06/29/2016 17:54   Ct Angio Chest Pe W Or Wo Contrast  Result Date: 06/30/2016 CLINICAL DATA:  Status post syncope and fall. Generalized chest pressure. Initial encounter. EXAM: CT ANGIOGRAPHY CHEST WITH CONTRAST TECHNIQUE: Multidetector CT imaging  of the chest was performed using the standard protocol during bolus administration of intravenous contrast. Multiplanar CT image reconstructions and MIPs were obtained to evaluate the vascular anatomy. CONTRAST:  100 mL of Isovue 370 IV contrast COMPARISON:  Chest radiograph performed 06/29/2016 FINDINGS: There is no evidence of pulmonary embolus. Minimal bibasilar atelectasis is noted. The lungs are otherwise clear. There is no evidence of significant focal consolidation, pleural effusion or pneumothorax. No masses are identified; no abnormal focal contrast enhancement is seen. The mediastinum is unremarkable appearance. No mediastinal lymphadenopathy is seen. No pericardial effusion is identified. The great vessels are grossly unremarkable in appearance. No axillary lymphadenopathy is seen. The thyroid gland is unremarkable in appearance. The visualized portions of the liver and spleen are unremarkable. The visualized portions of the pancreas, stomach, adrenal glands and kidneys are within normal limits. No acute osseous abnormalities are seen. Review of the MIP images confirms the above findings. IMPRESSION: 1. No evidence of pulmonary embolus. 2. Minimal bibasilar atelectasis noted.  Lungs otherwise clear. Electronically Signed   By: Garald Balding M.D.   On: 06/30/2016 05:06   Ct Cervical  Spine Wo Contrast  Result Date: 06/29/2016 CLINICAL DATA:  left frontal abrasion and hematoma. EXAM: CT HEAD WITHOUT CONTRAST CT CERVICAL SPINE WITHOUT CONTRAST TECHNIQUE: Multidetector CT imaging of the head and cervical spine was performed following the standard protocol without intravenous contrast. Multiplanar CT image reconstructions of the cervical spine were also generated. COMPARISON:  03/20/2016 FINDINGS: CT HEAD FINDINGS Brain: No evidence of acute infarction, hemorrhage, hydrocephalus, extra-axial collection or mass lesion/mass effect. Vascular: No hyperdense vessel or unexpected calcification. Skull: Normal. Negative for fracture or focal lesion. Sinuses/Orbits: No acute finding. Other: None CT CERVICAL SPINE FINDINGS Alignment: Straightening of normal cervical lordosis. Skull base and vertebrae: No acute fracture. No primary bone lesion or focal pathologic process. Soft tissues and spinal canal: No prevertebral fluid or swelling. No visible canal hematoma. Disc levels: Multi level disc space narrowing and ventral endplate spurring is identified. This is most advanced at C4-5 C5-6 and C6-7. Upper chest: No mediastinal mass identified. The lung apices appear clear. Other: None IMPRESSION: 1. No acute intracranial abnormalities. 2. Cervical spondylosis.  No evidence for fracture or subluxation. Electronically Signed   By: Kerby Moors M.D.   On: 06/29/2016 17:54   Mri Brain Without Contrast  Result Date: 06/29/2016 CLINICAL DATA:  Initial evaluation for acute syncope, chest pain, with left-sided weakness and facial twitching. EXAM: MRI HEAD WITHOUT CONTRAST TECHNIQUE: Multiplanar, multiecho pulse sequences of the brain and surrounding structures were obtained without intravenous contrast. COMPARISON:  Prior CT from earlier the same day. FINDINGS: Cerebral volume within normal limits for patient age. No focal parenchymal signal abnormality identified. No abnormal foci of restricted diffusion to  suggest acute or subacute ischemia. Gray-white matter differentiation well maintained. Major intracranial vascular flow voids are preserved. No acute or chronic intracranial hemorrhage. No areas of chronic infarction. No mass lesion, midline shift or mass effect. No hydrocephalus. No extra-axial fluid collection. Major dural sinuses are grossly patent. Craniocervical junction normal. Mild degenerative disc bulging noted at C2-3 without significant stenosis. Remainder the visualized upper cervical spine otherwise unremarkable. Pituitary gland within normal limits parent no acute abnormality about the globes and orbits. Paranasal sinuses are clear. No mastoid effusion. Inner ear structures grossly normal. Bone marrow signal intensity within normal limits. No scalp soft tissue abnormality. IMPRESSION: Normal brain MRI. Electronically Signed   By: Jeannine Boga M.D.   On: 06/29/2016 22:14  Subjective: Patient was seen this morning along with her RN. Stated that she felt tired because she did not sleep well last night- for no particular reason. No pain or nausea reported.  Discharge Exam:  Vitals:   06/30/16 2106 07/01/16 0125 07/01/16 0516 07/01/16 1402  BP: (!) 102/51 (!) 98/58 (!) 96/57 110/71  Pulse: 83 85 81 89  Resp: 18 19 18 18   Temp: 97.9 F (36.6 C) 97.2 F (36.2 C) 97.6 F (36.4 C) 97.6 F (36.4 C)  TempSrc: Oral Oral Oral Oral  SpO2: 98% 96% 97% 98%  Weight:  108.7 kg (239 lb 9.6 oz)    Height:        General exam: Pleasant middle-aged female lying comfortably propped up in bed. Minimal superficial bruising over left upper forehead near hairline.Marland Kitchen Respiratory system: Clear to auscultation. Respiratory effort normal. Cardiovascular system: S1 & S2 heard, RRR. No JVD, murmurs, rubs, gallops or clicks. No pedal edema. Telemetry: Sinus rhythm.  Gastrointestinal system: Abdomen is nondistended, soft and nontender. No organomegaly or masses felt. Normal bowel sounds  heard. Central nervous system: Alert and oriented. No focal neurological deficits. Extremities: Patient seems to have normal symmetric power in all extremities but there seems to be significant functional overlay and does not seem to be providing adequate effort.  Skin: No rashes, lesions or ulcers Psychiatry: Judgement and insight appear normal. Mood & affect: Anxious    The results of significant diagnostics from this hospitalization (including imaging, microbiology, ancillary and laboratory) are listed below for reference.     Microbiology: No results found for this or any previous visit (from the past 240 hour(s)).   Labs: BNP (last 3 results) No results for input(s): BNP in the last 8760 hours. Basic Metabolic Panel:  Recent Labs Lab 06/29/16 1509 06/30/16 0801  NA 135 140  K 3.7 3.6  CL 104 107  CO2 21* 27  GLUCOSE 104* 88  BUN 9 10  CREATININE 0.62 0.68  CALCIUM 9.8 8.7*   Liver Function Tests: No results for input(s): AST, ALT, ALKPHOS, BILITOT, PROT, ALBUMIN in the last 168 hours.  Recent Labs Lab 06/30/16 0232  LIPASE 44   No results for input(s): AMMONIA in the last 168 hours. CBC:  Recent Labs Lab 06/29/16 1509 06/30/16 0801  WBC 5.4 4.9  HGB 14.4 12.4  HCT 45.2 40.1  MCV 81.7 82.7  PLT 346 282   Cardiac Enzymes:  Recent Labs Lab 06/30/16 0232 06/30/16 0801  TROPONINI <0.03 <0.03   BNP: Invalid input(s): POCBNP CBG:  Recent Labs Lab 06/29/16 1505 06/30/16 0604  GLUCAP 105* 96   D-Dimer  Recent Labs  06/30/16 0232  DDIMER 0.57*   Hgb A1c  Recent Labs  06/30/16 0232  HGBA1C 6.4*   Lipid Profile  Recent Labs  06/30/16 0232  CHOL 222*  HDL 60  LDLCALC 135*  TRIG 134  CHOLHDL 3.7   Urinalysis    Component Value Date/Time   COLORURINE YELLOW 06/29/2016 [place] 06/29/2016 1733   LABSPEC 1.014 06/29/2016 1733   PHURINE 7.0 06/29/2016 1733   GLUCOSEU NEGATIVE 06/29/2016 1733   HGBUR NEGATIVE  06/29/2016 1733   BILIRUBINUR NEGATIVE 06/29/2016 1733   KETONESUR NEGATIVE 06/29/2016 1733   PROTEINUR NEGATIVE 06/29/2016 1733   UROBILINOGEN 0.2 04/17/2015 0644   NITRITE NEGATIVE 06/29/2016 1733   LEUKOCYTESUR NEGATIVE 06/29/2016 1733      Time coordinating discharge: Over 30 minutes  SIGNED:  Vernell Leep, MD, FACP, FHM. [facility] Pager [phone]  ZA:[phone]  If 7PM-7AM, please contact night-coverage [email or web address] Password [facility] 07/01/2016, 3:26 PM

## 2016-07-02 DIAGNOSIS — G8194 Hemiplegia, unspecified affecting left nondominant side: Secondary | ICD-10-CM | POA: Diagnosis not present

## 2016-07-02 DIAGNOSIS — M549 Dorsalgia, unspecified: Secondary | ICD-10-CM | POA: Diagnosis not present

## 2016-07-02 DIAGNOSIS — I10 Essential (primary) hypertension: Secondary | ICD-10-CM | POA: Diagnosis not present

## 2016-07-02 DIAGNOSIS — R569 Unspecified convulsions: Secondary | ICD-10-CM | POA: Diagnosis not present

## 2016-07-02 DIAGNOSIS — R55 Syncope and collapse: Secondary | ICD-10-CM | POA: Diagnosis not present

## 2016-07-04 DIAGNOSIS — M150 Primary generalized (osteo)arthritis: Secondary | ICD-10-CM | POA: Diagnosis not present

## 2016-07-04 DIAGNOSIS — F324 Major depressive disorder, single episode, in partial remission: Secondary | ICD-10-CM | POA: Diagnosis not present

## 2016-07-04 DIAGNOSIS — R569 Unspecified convulsions: Secondary | ICD-10-CM | POA: Diagnosis not present

## 2016-07-04 DIAGNOSIS — R110 Nausea: Secondary | ICD-10-CM | POA: Diagnosis not present

## 2016-07-04 DIAGNOSIS — I10 Essential (primary) hypertension: Secondary | ICD-10-CM | POA: Diagnosis not present

## 2016-07-04 DIAGNOSIS — K5190 Ulcerative colitis, unspecified, without complications: Secondary | ICD-10-CM | POA: Diagnosis not present

## 2016-07-04 DIAGNOSIS — G44209 Tension-type headache, unspecified, not intractable: Secondary | ICD-10-CM | POA: Diagnosis not present

## 2016-07-05 DIAGNOSIS — I10 Essential (primary) hypertension: Secondary | ICD-10-CM | POA: Diagnosis not present

## 2016-07-05 DIAGNOSIS — R55 Syncope and collapse: Secondary | ICD-10-CM | POA: Diagnosis not present

## 2016-07-23 ENCOUNTER — Ambulatory Visit: Payer: Medicare Other | Admitting: Diagnostic Neuroimaging

## 2016-08-12 ENCOUNTER — Ambulatory Visit: Payer: Medicare Other | Admitting: Diagnostic Neuroimaging

## 2016-10-17 ENCOUNTER — Emergency Department (HOSPITAL_COMMUNITY): Payer: Medicare Other

## 2016-10-17 ENCOUNTER — Inpatient Hospital Stay (HOSPITAL_COMMUNITY)
Admission: EM | Admit: 2016-10-17 | Discharge: 2016-10-19 | DRG: 392 | Disposition: A | Discharge status: Home / Self Care | Payer: Medicare Other | Attending: Internal Medicine | Admitting: Internal Medicine

## 2016-10-17 ENCOUNTER — Encounter (HOSPITAL_COMMUNITY): Payer: Self-pay | Admitting: *Deleted

## 2016-10-17 DIAGNOSIS — Z800 Family history of malignant neoplasm of digestive organs: Secondary | ICD-10-CM

## 2016-10-17 DIAGNOSIS — K529 Noninfective gastroenteritis and colitis, unspecified: Secondary | ICD-10-CM | POA: Diagnosis not present

## 2016-10-17 DIAGNOSIS — K219 Gastro-esophageal reflux disease without esophagitis: Secondary | ICD-10-CM | POA: Diagnosis present

## 2016-10-17 DIAGNOSIS — G40909 Epilepsy, unspecified, not intractable, without status epilepticus: Secondary | ICD-10-CM | POA: Diagnosis not present

## 2016-10-17 DIAGNOSIS — M329 Systemic lupus erythematosus, unspecified: Secondary | ICD-10-CM | POA: Diagnosis not present

## 2016-10-17 DIAGNOSIS — B1920 Unspecified viral hepatitis C without hepatic coma: Secondary | ICD-10-CM | POA: Diagnosis present

## 2016-10-17 DIAGNOSIS — R1032 Left lower quadrant pain: Secondary | ICD-10-CM | POA: Diagnosis not present

## 2016-10-17 DIAGNOSIS — Z880 Allergy status to penicillin: Secondary | ICD-10-CM

## 2016-10-17 DIAGNOSIS — G8929 Other chronic pain: Secondary | ICD-10-CM | POA: Diagnosis present

## 2016-10-17 DIAGNOSIS — I10 Essential (primary) hypertension: Secondary | ICD-10-CM | POA: Diagnosis present

## 2016-10-17 DIAGNOSIS — Z8719 Personal history of other diseases of the digestive system: Secondary | ICD-10-CM

## 2016-10-17 DIAGNOSIS — K5730 Diverticulosis of large intestine without perforation or abscess without bleeding: Secondary | ICD-10-CM | POA: Diagnosis not present

## 2016-10-17 DIAGNOSIS — Z79899 Other long term (current) drug therapy: Secondary | ICD-10-CM

## 2016-10-17 DIAGNOSIS — K589 Irritable bowel syndrome without diarrhea: Secondary | ICD-10-CM | POA: Diagnosis present

## 2016-10-17 DIAGNOSIS — Z90710 Acquired absence of both cervix and uterus: Secondary | ICD-10-CM

## 2016-10-17 DIAGNOSIS — R112 Nausea with vomiting, unspecified: Secondary | ICD-10-CM | POA: Diagnosis not present

## 2016-10-17 DIAGNOSIS — Z888 Allergy status to other drugs, medicaments and biological substances status: Secondary | ICD-10-CM

## 2016-10-17 DIAGNOSIS — Z91040 Latex allergy status: Secondary | ICD-10-CM

## 2016-10-17 DIAGNOSIS — K5190 Ulcerative colitis, unspecified, without complications: Secondary | ICD-10-CM | POA: Diagnosis present

## 2016-10-17 DIAGNOSIS — Z9049 Acquired absence of other specified parts of digestive tract: Secondary | ICD-10-CM

## 2016-10-17 DIAGNOSIS — Z9851 Tubal ligation status: Secondary | ICD-10-CM

## 2016-10-17 DIAGNOSIS — E876 Hypokalemia: Secondary | ICD-10-CM | POA: Diagnosis present

## 2016-10-17 DIAGNOSIS — Z91041 Radiographic dye allergy status: Secondary | ICD-10-CM

## 2016-10-17 LAB — COMPREHENSIVE METABOLIC PANEL
ALK PHOS: 65 U/L (ref 38–126)
ALT: 22 U/L (ref 14–54)
ANION GAP: 10 (ref 5–15)
AST: 20 U/L (ref 15–41)
Albumin: 3.5 g/dL (ref 3.5–5.0)
BILIRUBIN TOTAL: 0.3 mg/dL (ref 0.3–1.2)
BUN: 8 mg/dL (ref 6–20)
CALCIUM: 9.3 mg/dL (ref 8.9–10.3)
CO2: 24 mmol/L (ref 22–32)
Chloride: 103 mmol/L (ref 101–111)
Creatinine, Ser: 0.81 mg/dL (ref 0.44–1.00)
GFR calc non Af Amer: >60 mL/min (ref >60)
Glucose, Bld: 119 mg/dL — ABNORMAL HIGH (ref 65–99)
Potassium: 3.8 mmol/L (ref 3.5–5.1)
Sodium: 137 mmol/L (ref 135–145)
TOTAL PROTEIN: 7.8 g/dL (ref 6.5–8.1)

## 2016-10-17 LAB — CBC
HCT: 43.9 % (ref 36.0–46.0)
HEMOGLOBIN: 14.2 g/dL (ref 12.0–15.0)
MCH: 26.2 pg (ref 26.0–34.0)
MCHC: 32.3 g/dL (ref 30.0–36.0)
MCV: 80.8 fL (ref 78.0–100.0)
Platelets: 342 10*3/uL (ref 150–400)
RBC: 5.43 MIL/uL — AB (ref 3.87–5.11)
RDW: 15 % (ref 11.5–15.5)
WBC: 5.3 10*3/uL (ref 4.0–10.5)

## 2016-10-17 LAB — LIPASE, BLOOD: Lipase: 23 U/L (ref 11–51)

## 2016-10-17 LAB — URINALYSIS, ROUTINE W REFLEX MICROSCOPIC
BILIRUBIN URINE: NEGATIVE
Glucose, UA: NEGATIVE mg/dL
Hgb urine dipstick: NEGATIVE
Ketones, ur: NEGATIVE mg/dL
LEUKOCYTES UA: NEGATIVE
NITRITE: NEGATIVE
Protein, ur: NEGATIVE mg/dL
SPECIFIC GRAVITY, URINE: 1.025 (ref 1.005–1.030)
pH: 5 (ref 5.0–8.0)

## 2016-10-17 MED ORDER — FENTANYL CITRATE (PF) 100 MCG/2ML IJ SOLN
50.0000 ug | Freq: Once | INTRAMUSCULAR | Status: AC
  Administered 2016-10-17: 50 ug via INTRAVENOUS
  Filled 2016-10-17: qty 2

## 2016-10-17 MED ORDER — ONDANSETRON HCL 4 MG/2ML IJ SOLN
4.0000 mg | Freq: Once | INTRAMUSCULAR | Status: AC
  Administered 2016-10-17: 4 mg via INTRAVENOUS
  Filled 2016-10-17: qty 2

## 2016-10-17 MED ORDER — DICYCLOMINE HCL 10 MG PO CAPS
10.0000 mg | ORAL_CAPSULE | Freq: Once | ORAL | Status: AC
  Administered 2016-10-17: 10 mg via ORAL
  Filled 2016-10-17: qty 1

## 2016-10-17 MED ORDER — FENTANYL CITRATE (PF) 100 MCG/2ML IJ SOLN
25.0000 ug | Freq: Once | INTRAMUSCULAR | Status: AC
  Administered 2016-10-17: 25 ug via INTRAVENOUS
  Filled 2016-10-17: qty 2

## 2016-10-17 MED ORDER — SODIUM CHLORIDE 0.9 % IV BOLUS (SEPSIS)
1000.0000 mL | Freq: Once | INTRAVENOUS | Status: AC
Start: 2016-10-17 — End: 2016-10-17
  Administered 2016-10-17: 1000 mL via INTRAVENOUS

## 2016-10-17 MED ORDER — IOPAMIDOL (ISOVUE-300) INJECTION 61%
INTRAVENOUS | Status: AC
  Administered 2016-10-17: 100 mL
  Filled 2016-10-17: qty 100

## 2016-10-17 NOTE — ED Triage Notes (Signed)
p reports that she thinks she has a colitis flare up. Pt states that she has had chills, abdominal pain, and diarrhea for 2 days.

## 2016-10-17 NOTE — ED Provider Notes (Signed)
[facility] DEPT Provider Note   CSN: [id number] Arrival date & time: 10/17/16  1307     History   Chief Complaint Chief Complaint  Patient presents with  . Abdominal Pain    HPI Erin Wolf is a 60 y.o. female.  The history is provided by the patient and medical records. No language interpreter was used.     4 y F PMH UC currently on Mesalamine enema (currently out) who presents today with L sided abdominal pain LLQ. Describes as crampy and severe. States intermittently radiates to back. Similar to prior UC flares. Has had nausea and vomiting. This started on 10/15/2016. States day prior had some non-alcoholic eggnog to celebrate Christmas. Has had fever up to 101F orally last night. Has been eating ice chips which helps. Feels like she can't keep anything down anymore. Called her GI doc at [facility], cannot get appointment until beginning of January 2018. Her pain is constant. Has not seen blood in stool, but describes as mucous like in appearance. Feels that her symptoms are gradually worsening.   Past Medical History:  Diagnosis Date  . Anxiety   . Chest pain, exertional   . Chronic abdominal pain   . Chronic lower back pain   . Depression 01/03/2012  . Diverticulosis   . 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 Fibroids 01/03/2012  . GERD (gastroesophageal reflux disease)   . Hepatitis C    "signs of; not active" (05/28/2013)  . HTN (hypertension), benign 01/03/2012  . Hyperlipidemia 01/03/2012  . Hypertension   . IBS (irritable bowel syndrome)   . Migraines    "a few times/yr" (05/28/2013)  . Nausea and vomiting    chronic, recurrent  . Seizure ([place])    "today; < 1 yr ago, they just happen; started having them in my 20's" (05/28/2013)  . Seizure disorder ([place])   . SLE (systemic lupus erythematosus) ([place])   . Ulcerative colitis     Patient Active Problem List   Diagnosis Date Noted  . LLQ abdominal pain 10/17/2016  . Syncope and collapse 06/29/2016  . Left-sided  weakness 06/29/2016  . Facial twitching 06/29/2016  . IBS (irritable bowel syndrome) 06/29/2016  . Faintness   . Dehydration 05/09/2014  . GERD (gastroesophageal reflux disease) 05/09/2014  . Gastroenteritis 05/09/2014  . Chest pain, atypical 05/09/2014  . Chronic ulcerative colitis ([place]) 05/09/2014  . Acute gastroenteritis 05/09/2014  . Ulcerative colitis ([place]) 05/27/2013  . Hypokalemia 05/27/2013  . Anemia 01/04/2012  . Transaminitis 01/04/2012  . Diarrhea 01/04/2012  . Chest pain 01/03/2012  . SOB (shortness of breath) 01/03/2012  . HTN (hypertension), benign 01/03/2012  . Hx of migraines 01/03/2012  . Seizure ([place]) 01/03/2012  . Depression 01/03/2012  . Ulcerative colitis 01/03/2012  . Chronic back pain 01/03/2012  . 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 Hyperlipidemia 01/03/2012  . Hepatitis C 01/03/2012  . Fibroids 01/03/2012    Past Surgical History:  Procedure Laterality Date  . ABDOMINAL HYSTERECTOMY  1978   "partial" (05/28/2013)'fibroids"  . BILATERAL OOPHORECTOMY Bilateral 1987  . CHOLECYSTECTOMY  1978  . COLONOSCOPY N/A 05/31/2013   Procedure: COLONOSCOPY;  Surgeon: Wonda Horner, MD;  Location: [facility] ENDOSCOPY;  Service: Endoscopy;  Laterality: N/A;  . ESOPHAGOGASTRODUODENOSCOPY (EGD) WITH PROPOFOL N/A 05/11/2014   Procedure: ESOPHAGOGASTRODUODENOSCOPY (EGD) WITH PROPOFOL;  Surgeon: Lear Ng, MD;  Location: [facility] ENDOSCOPY;  Service: Endoscopy;  Laterality: N/A;  egd first  . EUS N/A 06/21/2016   Procedure: ESOPHAGEAL ENDOSCOPIC ULTRASOUND (EUS) RADIAL;  Surgeon:  Arta Silence, MD;  Location: [facility] ENDOSCOPY;  Service: Endoscopy;  Laterality: N/A;  . FLEXIBLE SIGMOIDOSCOPY N/A 05/11/2014   Procedure: FLEXIBLE SIGMOIDOSCOPY;  Surgeon: Lear Ng, MD;  Location: [facility] ENDOSCOPY;  Service: Endoscopy;  Laterality: N/A;  . TUBAL LIGATION      OB History    No data available       Home Medications    Prior to Admission medications   Medication Sig Start  Date End Date Taking? Authorizing Provider  cyclobenzaprine (FLEXERIL) 5 MG tablet Take 1 tablet (5 mg total) by mouth 3 (three) times daily as needed for muscle spasms. 10/01/14  Yes Billy Fischer, MD  FLUoxetine (PROZAC) 20 MG capsule Take 20 mg by mouth every morning.   Yes Historical Provider, MD  ibuprofen (ADVIL,MOTRIN) 200 MG tablet Take 400 mg by mouth every 6 (six) hours as needed.   Yes Historical Provider, MD  levETIRAcetam (KEPPRA XR) 500 MG 24 hr tablet Take 1-2 tablets (500-1,000 mg total) by mouth 2 (two) times daily. Take 1 tablet in the mornings and 2 tabs at bedtime. 07/01/16  Yes Modena Jansky, MD  Multiple Vitamins-Minerals (CENTRUM SILVER 50+WOMEN) TABS Take 1 tablet by mouth every morning.   Yes Historical Provider, MD  Propylene Glycol (SYSTANE BALANCE OP) Place 2 drops into both eyes 2 (two) times daily as needed (dry eyes).    Yes Historical Provider, MD  triamterene-hydrochlorothiazide (MAXZIDE-25) 37.5-25 MG tablet Take 1 tablet by mouth daily. 09/30/16  Yes Historical Provider, MD  dicyclomine (BENTYL) 20 MG tablet Take 1 tablet (20 mg total) by mouth 4 (four) times daily -  before meals and at bedtime. Patient not taking: Reported on 10/17/2016 06/12/16   Antonietta Breach, PA-C  mesalamine [facility]) 4 G enema Place 4 g rectally at bedtime as needed (colitis flare up.).     Historical Provider, MD  ondansetron (ZOFRAN) 4 MG tablet Take 1 tablet (4 mg total) by mouth every 6 (six) hours. Patient not taking: Reported on 10/17/2016 01/19/15   Ezequiel Essex, MD    Family History Family History  Problem Relation Age of Onset  . Colon cancer Brother     Social History Social History  Substance Use Topics  . Smoking status: Never Smoker  . Smokeless tobacco: Never Used  . Alcohol use Yes     Comment: 05/28/2013 "glass of wine rarely; weddings, etc"     Allergies   Latex; Nsaids; Penicillins; Wellbutrin [bupropion hcl]; Aspirin; Darvocet [propoxyphene n-acetaminophen];  Isometheptene-dichloral-apap; Tape; Toradol [ketorolac tromethamine]; Tylenol [acetaminophen]; and Dilaudid [hydromorphone hcl]   Review of Systems Review of Systems  Constitutional: Positive for appetite change (decreased), chills and fever.  HENT: Negative for ear pain and sore throat.   Eyes: Negative for pain and visual disturbance.  Respiratory: Negative for cough and shortness of breath.   Cardiovascular: Negative for chest pain and palpitations.  Gastrointestinal: Positive for abdominal pain (LLQ, crampy), diarrhea, nausea and vomiting.  Genitourinary: Negative for dysuria and hematuria.  Musculoskeletal: Negative for arthralgias and back pain.  Skin: Negative for color change and rash.  Neurological: Negative for seizures and syncope.  All other systems reviewed and are negative.    Physical Exam Updated Vital Signs BP 113/85   Pulse 97   Temp 98 F (36.7 C) (Oral)   Resp 15   SpO2 99%   Physical Exam  Constitutional: She is oriented to person, place, and time. She appears well-developed and well-nourished. No distress.  HENT:  Head: Normocephalic and  atraumatic.  Right Ear: External ear normal.  Left Ear: External ear normal.  Mouth/Throat: Oropharynx is clear and moist.  Eyes: Conjunctivae and EOM are normal.  Neck: Neck supple.  Cardiovascular: Normal rate and regular rhythm.   No murmur heard. Pulmonary/Chest: Effort normal and breath sounds normal. No respiratory distress.  Abdominal: Soft. Bowel sounds are normal. There is tenderness (mild LLQ ttp). There is rebound (in LLQ). There is no guarding.  Musculoskeletal: Normal range of motion. She exhibits no edema.  Neurological: She is alert and oriented to person, place, and time.  Skin: Skin is warm and dry.  Psychiatric: She has a normal mood and affect.  Nursing note and vitals reviewed.    ED Treatments / Results  Labs (all labs ordered are listed, but only abnormal results are displayed) Labs  Reviewed  COMPREHENSIVE METABOLIC PANEL - Abnormal; Notable for the following:       Result Value   Glucose, Bld 119 (*)    All other components within normal limits  CBC - Abnormal; Notable for the following:    RBC 5.43 (*)    All other components within normal limits  URINALYSIS, ROUTINE W REFLEX MICROSCOPIC - Abnormal; Notable for the following:    APPearance HAZY (*)    All other components within normal limits  LIPASE, BLOOD    EKG  EKG Interpretation None       Radiology Ct Abdomen Pelvis W Contrast  Result Date: 10/17/2016 CLINICAL DATA:  Diarrhea and left lower quadrant pain EXAM: CT ABDOMEN AND PELVIS WITH CONTRAST TECHNIQUE: Multidetector CT imaging of the abdomen and pelvis was performed using the standard protocol following bolus administration of intravenous contrast. CONTRAST:  14mL ISOVUE-300 IOPAMIDOL (ISOVUE-300) INJECTION 61% COMPARISON:  CT abdomen pelvis 06/11/2016 FINDINGS: Lower chest: No pulmonary nodules. No visible pleural or pericardial effusion. Hepatobiliary: Normal hepatic size and contours without focal liver lesion. No perihepatic ascites. No intra- or extrahepatic biliary dilatation. Status post cholecystectomy. Pancreas: The pancreatic duct is mildly dilated, measuring 4 mm at the pancreatic neck. No focal strictures identified. The degree of dilatation is decreased compared to the prior study. Spleen: Normal. 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 The right adrenal gland is normal. No hydronephrosis or solid renal mass. Stomach/Bowel: No abnormal bowel dilatation. No bowel wall thickening or adjacent fat stranding to indicate acute inflammation. No abdominal fluid collection. Despite review of multiplanar reformatted images in 3 orthogonal planes, the appendix could not be adequately visualized. However, there is no inflammatory stranding or free fluid within the right lower  quadrant. There is diverticulosis of the descending and sigmoid colon without acute inflammation. Vascular/Lymphatic: Normal course and caliber of the major abdominal vessels. No abdominal or pelvic adenopathy. Reproductive: Status post hysterectomy.  No adnexal mass. Musculoskeletal: No lytic or blastic osseous lesion. Normal visualized extrathoracic and extraperitoneal soft tissues. Other: No contributory non-categorized findings. IMPRESSION: 1. No acute abnormality of the abdomen or pelvis. 2. Descending colonic and rectosigmoid diverticulosis without acute diverticulitis. 3. Decreased dilatation of the pancreatic duct. Electronically Signed   By: Ulyses Jarred M.D.   On: 10/17/2016 22:26    Procedures Procedures (including critical care time)  Medications Ordered in ED Medications  sodium chloride 0.9 % bolus 1,000 mL (1,000 mLs Intravenous New Bag/Given 10/17/16 1948)  dicyclomine (BENTYL) capsule 10 mg (10 mg Oral Given 10/17/16 2000)  ondansetron (ZOFRAN) injection 4 mg (4 mg Intravenous Given 10/17/16 1948)  fentaNYL (  SUBLIMAZE) injection 25 mcg (25 mcg Intravenous Given 10/17/16 1948)  fentaNYL (SUBLIMAZE) injection 50 mcg (50 mcg Intravenous Given 10/17/16 2051)  iopamidol (ISOVUE-300) 61 % injection (100 mLs  Contrast Given 10/17/16 2147)  fentaNYL (SUBLIMAZE) injection 50 mcg (50 mcg Intravenous Given 10/17/16 2306)     Initial Impression / Assessment and Plan / ED Course  I have reviewed the triage vital signs and the nursing notes.  Pertinent labs & imaging results that were available during my care of the patient were reviewed by me and considered in my medical decision making (see chart for details).  Clinical Course     Patient with a history of ulcerative colitis presents today with 3 days of worsening left lower quadrant pain, fever, mucus stools. Patient states this is consistent with her prior flares in her ulcerative colitis. She has mild to moderate tenderness in her  left lower quadrant with rebound present. Obtained a CT abdomen, pelvis which does not demonstrate any acute intra-abdominal findings that would be consistent with the patient's pain. Patient does not want to eat or drink and she is afraid that she will vomit. She has received Zofran and also several doses of IV pain medication.  Given the patient's inability to tolerate anything by mouth, I discussed with the hospitalist who is agreeable to admit the patient for pain control and nausea management. Patient is agreeable with this plan. She is stable at the time of admission.  Final Clinical Impressions(s) / ED Diagnoses   Final diagnoses:  LLQ abdominal pain  Nausea and vomiting in adult  History of ulcerative colitis    New Prescriptions New Prescriptions   No medications on file     Theodosia Quay, MD 10/17/16 [place], MD 10/18/16 1455

## 2016-10-17 NOTE — ED Notes (Signed)
Pt transported to CT ?

## 2016-10-18 ENCOUNTER — Encounter (HOSPITAL_COMMUNITY): Payer: Self-pay | Admitting: Internal Medicine

## 2016-10-18 DIAGNOSIS — Z79899 Other long term (current) drug therapy: Secondary | ICD-10-CM | POA: Diagnosis not present

## 2016-10-18 DIAGNOSIS — Z8719 Personal history of other diseases of the digestive system: Secondary | ICD-10-CM | POA: Diagnosis not present

## 2016-10-18 DIAGNOSIS — E876 Hypokalemia: Secondary | ICD-10-CM | POA: Diagnosis present

## 2016-10-18 DIAGNOSIS — K529 Noninfective gastroenteritis and colitis, unspecified: Secondary | ICD-10-CM | POA: Diagnosis not present

## 2016-10-18 DIAGNOSIS — M329 Systemic lupus erythematosus, unspecified: Secondary | ICD-10-CM | POA: Diagnosis present

## 2016-10-18 DIAGNOSIS — B1920 Unspecified viral hepatitis C without hepatic coma: Secondary | ICD-10-CM | POA: Diagnosis present

## 2016-10-18 DIAGNOSIS — R1032 Left lower quadrant pain: Secondary | ICD-10-CM | POA: Diagnosis not present

## 2016-10-18 DIAGNOSIS — I10 Essential (primary) hypertension: Secondary | ICD-10-CM | POA: Diagnosis present

## 2016-10-18 DIAGNOSIS — Z91040 Latex allergy status: Secondary | ICD-10-CM | POA: Diagnosis not present

## 2016-10-18 DIAGNOSIS — G8929 Other chronic pain: Secondary | ICD-10-CM | POA: Diagnosis present

## 2016-10-18 DIAGNOSIS — R112 Nausea with vomiting, unspecified: Secondary | ICD-10-CM | POA: Diagnosis not present

## 2016-10-18 DIAGNOSIS — Z9049 Acquired absence of other specified parts of digestive tract: Secondary | ICD-10-CM | POA: Diagnosis not present

## 2016-10-18 DIAGNOSIS — Z9851 Tubal ligation status: Secondary | ICD-10-CM | POA: Diagnosis not present

## 2016-10-18 DIAGNOSIS — Z800 Family history of malignant neoplasm of digestive organs: Secondary | ICD-10-CM | POA: Diagnosis not present

## 2016-10-18 DIAGNOSIS — K589 Irritable bowel syndrome without diarrhea: Secondary | ICD-10-CM | POA: Diagnosis present

## 2016-10-18 DIAGNOSIS — G40909 Epilepsy, unspecified, not intractable, without status epilepticus: Secondary | ICD-10-CM | POA: Diagnosis present

## 2016-10-18 DIAGNOSIS — Z91041 Radiographic dye allergy status: Secondary | ICD-10-CM | POA: Diagnosis not present

## 2016-10-18 DIAGNOSIS — K219 Gastro-esophageal reflux disease without esophagitis: Secondary | ICD-10-CM | POA: Diagnosis present

## 2016-10-18 DIAGNOSIS — Z880 Allergy status to penicillin: Secondary | ICD-10-CM | POA: Diagnosis not present

## 2016-10-18 DIAGNOSIS — Z90710 Acquired absence of both cervix and uterus: Secondary | ICD-10-CM | POA: Diagnosis not present

## 2016-10-18 DIAGNOSIS — Z888 Allergy status to other drugs, medicaments and biological substances status: Secondary | ICD-10-CM | POA: Diagnosis not present

## 2016-10-18 LAB — CBC
HEMATOCRIT: 38.8 % (ref 36.0–46.0)
Hemoglobin: 12.4 g/dL (ref 12.0–15.0)
MCH: 25.9 pg — AB (ref 26.0–34.0)
MCHC: 32 g/dL (ref 30.0–36.0)
MCV: 81 fL (ref 78.0–100.0)
Platelets: 292 10*3/uL (ref 150–400)
RBC: 4.79 MIL/uL (ref 3.87–5.11)
RDW: 15 % (ref 11.5–15.5)
WBC: 5.3 10*3/uL (ref 4.0–10.5)

## 2016-10-18 LAB — HEPATIC FUNCTION PANEL
ALT: 17 U/L (ref 14–54)
AST: 18 U/L (ref 15–41)
Albumin: 3 g/dL — ABNORMAL LOW (ref 3.5–5.0)
Alkaline Phosphatase: 52 U/L (ref 38–126)
BILIRUBIN DIRECT: 0.1 mg/dL (ref 0.1–0.5)
BILIRUBIN TOTAL: 0.4 mg/dL (ref 0.3–1.2)
Indirect Bilirubin: 0.3 mg/dL (ref 0.3–0.9)
Total Protein: 6.7 g/dL (ref 6.5–8.1)

## 2016-10-18 LAB — BASIC METABOLIC PANEL
Anion gap: 9 (ref 5–15)
BUN: 8 mg/dL (ref 6–20)
CHLORIDE: 104 mmol/L (ref 101–111)
CO2: 25 mmol/L (ref 22–32)
Calcium: 8.4 mg/dL — ABNORMAL LOW (ref 8.9–10.3)
Creatinine, Ser: 0.65 mg/dL (ref 0.44–1.00)
GFR calc Af Amer: >60 mL/min (ref >60)
GFR calc non Af Amer: >60 mL/min (ref >60)
Glucose, Bld: 102 mg/dL — ABNORMAL HIGH (ref 65–99)
POTASSIUM: 3.4 mmol/L — AB (ref 3.5–5.1)
Sodium: 138 mmol/L (ref 135–145)

## 2016-10-18 MED ORDER — MESALAMINE 4 G RE ENEM: 4.0000 g | ENEMA | Freq: Every evening | RECTAL | Status: DC | PRN

## 2016-10-18 MED ORDER — CYCLOBENZAPRINE HCL 10 MG PO TABS
5.0000 mg | ORAL_TABLET | Freq: Three times a day (TID) | ORAL | Status: DC | PRN
  Administered 2016-10-19: 5 mg via ORAL
  Filled 2016-10-18: qty 1

## 2016-10-18 MED ORDER — SODIUM CHLORIDE 0.9 % IV SOLN
INTRAVENOUS | Status: DC
  Administered 2016-10-18: 02:00:00 via INTRAVENOUS

## 2016-10-18 MED ORDER — ENOXAPARIN SODIUM 40 MG/0.4ML ~~LOC~~ SOLN
40.0000 mg | SUBCUTANEOUS | Status: DC
  Administered 2016-10-18: 40 mg via SUBCUTANEOUS
  Filled 2016-10-18: qty 0.4

## 2016-10-18 MED ORDER — ACETAMINOPHEN 325 MG PO TABS: 650.0000 mg | ORAL_TABLET | Freq: Four times a day (QID) | ORAL | Status: DC | PRN

## 2016-10-18 MED ORDER — LEVETIRACETAM ER 500 MG PO TB24
500.0000 mg | ORAL_TABLET | Freq: Every day | ORAL | Status: DC
  Administered 2016-10-18 – 2016-10-19 (×2): 500 mg via ORAL
  Filled 2016-10-18 (×2): qty 1

## 2016-10-18 MED ORDER — ONDANSETRON HCL 4 MG PO TABS: 4.0000 mg | ORAL_TABLET | Freq: Four times a day (QID) | ORAL | Status: DC | PRN

## 2016-10-18 MED ORDER — ONDANSETRON HCL 4 MG/2ML IJ SOLN
4.0000 mg | Freq: Four times a day (QID) | INTRAMUSCULAR | Status: DC | PRN
  Administered 2016-10-19: 4 mg via INTRAVENOUS
  Filled 2016-10-18: qty 2

## 2016-10-18 MED ORDER — LEVETIRACETAM ER 500 MG PO TB24
1000.0000 mg | ORAL_TABLET | Freq: Every day | ORAL | Status: DC
  Administered 2016-10-18 (×2): 1000 mg via ORAL
  Filled 2016-10-18 (×2): qty 2

## 2016-10-18 MED ORDER — FENTANYL CITRATE (PF) 100 MCG/2ML IJ SOLN
25.0000 ug | Freq: Four times a day (QID) | INTRAMUSCULAR | Status: DC | PRN
  Administered 2016-10-18 – 2016-10-19 (×3): 25 ug via INTRAVENOUS
  Filled 2016-10-18 (×3): qty 2

## 2016-10-18 MED ORDER — ACETAMINOPHEN 650 MG RE SUPP: 650.0000 mg | Freq: Four times a day (QID) | RECTAL | Status: DC | PRN

## 2016-10-18 MED ORDER — POLYVINYL ALCOHOL 1.4 % OP SOLN: Freq: Two times a day (BID) | OPHTHALMIC | Status: DC | PRN

## 2016-10-18 MED ORDER — FENTANYL CITRATE (PF) 100 MCG/2ML IJ SOLN
25.0000 ug | INTRAMUSCULAR | Status: DC | PRN
  Administered 2016-10-18 (×2): 25 ug via INTRAVENOUS
  Filled 2016-10-18 (×2): qty 2

## 2016-10-18 MED ORDER — FLUOXETINE HCL 20 MG PO CAPS
20.0000 mg | ORAL_CAPSULE | Freq: Every morning | ORAL | Status: DC
  Administered 2016-10-18 – 2016-10-19 (×2): 20 mg via ORAL
  Filled 2016-10-18 (×2): qty 1

## 2016-10-18 NOTE — H&P (Signed)
History and Physical    Erin Wolf [id number] DOB: [birth year] DOA: 10/17/2016  PCP: Shirline Frees, MD  Patient coming from: Home.  Chief Complaint: Abdominal pain and nausea vomiting.  HPI: Erin Wolf is a 60 y.o. female with history of ulcerative colitis, hypertension presents to the ER because of persistent nausea vomiting and left lower quadrant pain. Patient states she ate eggnog 3 days ago and her symptoms started then. Pain is mostly in the left lower quadrant radiating to her back. Denies any diarrhea but does have a lot of mucus in the stools. Denies any blood in the vomitus. CT abdomen and pelvis is unremarkable. Since patient has persistent pain and unable to take in anything last 2 days admitted for further observation.  ED Course: CT abdomen and pelvis was unremarkable.  Review of Systems: As per HPI, rest all negative.   Past Medical History:  Diagnosis Date  . Anxiety   . Chest pain, exertional   . Chronic abdominal pain   . Chronic lower back pain   . Depression 01/03/2012  . Diverticulosis   . 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 Fibroids 01/03/2012  . GERD (gastroesophageal reflux disease)   . Hepatitis C    "signs of; not active" (05/28/2013)  . HTN (hypertension), benign 01/03/2012  . Hyperlipidemia 01/03/2012  . Hypertension   . IBS (irritable bowel syndrome)   . Migraines    "a few times/yr" (05/28/2013)  . Nausea and vomiting    chronic, recurrent  . Seizure ([place])    "today; < 1 yr ago, they just happen; started having them in my 20's" (05/28/2013)  . Seizure disorder ([place])   . SLE (systemic lupus erythematosus) ([place])   . Ulcerative colitis     Past Surgical History:  Procedure Laterality Date  . ABDOMINAL HYSTERECTOMY  1978   "partial" (05/28/2013)'fibroids"  . BILATERAL OOPHORECTOMY Bilateral 1987  . CHOLECYSTECTOMY  1978  . COLONOSCOPY N/A 05/31/2013   Procedure: COLONOSCOPY;  Surgeon: Wonda Horner, MD;  Location: [facility] ENDOSCOPY;   Service: Endoscopy;  Laterality: N/A;  . ESOPHAGOGASTRODUODENOSCOPY (EGD) WITH PROPOFOL N/A 05/11/2014   Procedure: ESOPHAGOGASTRODUODENOSCOPY (EGD) WITH PROPOFOL;  Surgeon: Lear Ng, MD;  Location: [facility] ENDOSCOPY;  Service: Endoscopy;  Laterality: N/A;  egd first  . EUS N/A 06/21/2016   Procedure: ESOPHAGEAL ENDOSCOPIC ULTRASOUND (EUS) RADIAL;  Surgeon: Arta Silence, MD;  Location: [facility] ENDOSCOPY;  Service: Endoscopy;  Laterality: N/A;  . FLEXIBLE SIGMOIDOSCOPY N/A 05/11/2014   Procedure: FLEXIBLE SIGMOIDOSCOPY;  Surgeon: Lear Ng, MD;  Location: [facility] ENDOSCOPY;  Service: Endoscopy;  Laterality: N/A;  . TUBAL LIGATION       reports that she has never smoked. She has never used smokeless tobacco. She reports that she drinks alcohol. She reports that she does not use drugs.  Allergies  Allergen Reactions  . Latex Other (See Comments)    Peels skin  . Nsaids Other (See Comments)    HAS COLITIS FLARES  . Penicillins Hives    Has patient had a PCN reaction causing immediate rash, facial/tongue/throat swelling, SOB or lightheadedness with hypotension: Yes Has patient had a PCN reaction causing severe rash involving mucus membranes or skin necrosis: Unknown Has patient had a PCN reaction that required hospitalization: Unknown Has patient had a PCN reaction occurring within the last 10 years: No If all of the above answers are "NO", then may proceed with Cephalosporin use.   . Wellbutrin [Bupropion Hcl] Other (See Comments)    Insomnia and  headaches  . Aspirin Nausea Only  . Darvocet [Propoxyphene N-Acetaminophen] Nausea And Vomiting  . Isometheptene-Dichloral-Apap Nausea Only    IV dye?  Marland Kitchen Tape Hives  . Toradol [Ketorolac Tromethamine] Nausea And Vomiting  . Tylenol [Acetaminophen] Nausea Only  . Dilaudid [Hydromorphone Hcl] Itching, Nausea Only and Rash    Family History  Problem Relation Age of Onset  . Colon cancer Brother     Prior to Admission medications     Medication Sig Start Date End Date Taking? Authorizing Provider  cyclobenzaprine (FLEXERIL) 5 MG tablet Take 1 tablet (5 mg total) by mouth 3 (three) times daily as needed for muscle spasms. 10/01/14  Yes Billy Fischer, MD  FLUoxetine (PROZAC) 20 MG capsule Take 20 mg by mouth every morning.   Yes Historical Provider, MD  ibuprofen (ADVIL,MOTRIN) 200 MG tablet Take 400 mg by mouth every 6 (six) hours as needed.   Yes Historical Provider, MD  levETIRAcetam (KEPPRA XR) 500 MG 24 hr tablet Take 1-2 tablets (500-1,000 mg total) by mouth 2 (two) times daily. Take 1 tablet in the mornings and 2 tabs at bedtime. 07/01/16  Yes Modena Jansky, MD  Multiple Vitamins-Minerals (CENTRUM SILVER 50+WOMEN) TABS Take 1 tablet by mouth every morning.   Yes Historical Provider, MD  Propylene Glycol (SYSTANE BALANCE OP) Place 2 drops into both eyes 2 (two) times daily as needed (dry eyes).    Yes Historical Provider, MD  triamterene-hydrochlorothiazide (MAXZIDE-25) 37.5-25 MG tablet Take 1 tablet by mouth daily. 09/30/16  Yes Historical Provider, MD  dicyclomine (BENTYL) 20 MG tablet Take 1 tablet (20 mg total) by mouth 4 (four) times daily -  before meals and at bedtime. Patient not taking: Reported on 10/17/2016 06/12/16   Antonietta Breach, PA-C  mesalamine [facility]) 4 G enema Place 4 g rectally at bedtime as needed (colitis flare up.).     Historical Provider, MD  ondansetron (ZOFRAN) 4 MG tablet Take 1 tablet (4 mg total) by mouth every 6 (six) hours. Patient not taking: Reported on 10/17/2016 01/19/15   Ezequiel Essex, MD    Physical Exam: Vitals:   10/17/16 1656 10/17/16 1800 10/17/16 2300 10/18/16 0000  BP: 133/89 127/80 113/85 108/72  Pulse: 116 105 97 95  Resp: 16 15    Temp: 98 F (36.7 C)     TempSrc: Oral     SpO2: 96% 92% 99% 93%      Constitutional: Moderately built and nourished. Vitals:   10/17/16 1656 10/17/16 1800 10/17/16 2300 10/18/16 0000  BP: 133/89 127/80 113/85 108/72  Pulse: 116 105  97 95  Resp: 16 15    Temp: 98 F (36.7 C)     TempSrc: Oral     SpO2: 96% 92% 99% 93%   Eyes: Anicteric no pallor. ENMT: No discharge from the ears eyes nose and mouth. Neck: No mass felt. No neck rigidity. Respiratory: No rhonchi or crepitations. Cardiovascular: S1 and S2 heard. No murmurs appreciated. Abdomen: Mild tenderness in the left lower quadrant. Musculoskeletal: No edema. No joint effusion. Skin: No rash. Skin appears warm. Neurologic: Alert awake oriented to time place and person. Moves all extremities. Psychiatric: Appears normal. Normal affect.   Labs on Admission: I have personally reviewed following labs and imaging studies  CBC:  Recent Labs Lab 10/17/16 1326  WBC 5.3  HGB 14.2  HCT 43.9  MCV 80.8  PLT [id number]   Basic Metabolic Panel:  Recent Labs Lab 10/17/16 1326  NA 137  K 3.8  CL 103  CO2 24  GLUCOSE 119*  BUN 8  CREATININE 0.81  CALCIUM 9.3   GFR: CrCl cannot be calculated (Unknown ideal weight.). Liver Function Tests:  Recent Labs Lab 10/17/16 1326  AST 20  ALT 22  ALKPHOS 65  BILITOT 0.3  PROT 7.8  ALBUMIN 3.5    Recent Labs Lab 10/17/16 1326  LIPASE 23   No results for input(s): AMMONIA in the last 168 hours. Coagulation Profile: No results for input(s): INR, PROTIME in the last 168 hours. Cardiac Enzymes: No results for input(s): CKTOTAL, CKMB, CKMBINDEX, TROPONINI in the last 168 hours. BNP (last 3 results) No results for input(s): PROBNP in the last 8760 hours. HbA1C: No results for input(s): HGBA1C in the last 72 hours. CBG: No results for input(s): GLUCAP in the last 168 hours. Lipid Profile: No results for input(s): CHOL, HDL, LDLCALC, TRIG, CHOLHDL, LDLDIRECT in the last 72 hours. Thyroid Function Tests: No results for input(s): TSH, T4TOTAL, FREET4, T3FREE, THYROIDAB in the last 72 hours. Anemia Panel: No results for input(s): VITAMINB12, FOLATE, FERRITIN, TIBC, IRON, RETICCTPCT in the last 72 hours. Urine  analysis:    Component Value Date/Time   COLORURINE YELLOW 10/17/2016 1930   APPEARANCEUR HAZY (A) 10/17/2016 1930   LABSPEC 1.025 10/17/2016 1930   PHURINE 5.0 10/17/2016 1930   GLUCOSEU NEGATIVE 10/17/2016 1930   HGBUR NEGATIVE 10/17/2016 1930   BILIRUBINUR NEGATIVE 10/17/2016 1930   KETONESUR NEGATIVE 10/17/2016 1930   PROTEINUR NEGATIVE 10/17/2016 1930   UROBILINOGEN 0.2 04/17/2015 0644   NITRITE NEGATIVE 10/17/2016 1930   LEUKOCYTESUR NEGATIVE 10/17/2016 1930   Sepsis Labs: @LABRCNTIP (procalcitonin:4,lacticidven:4) )No results found for this or any previous visit (from the past 240 hour(s)).   Radiological Exams on Admission: Ct Abdomen Pelvis W Contrast  Result Date: 10/17/2016 CLINICAL DATA:  Diarrhea and left lower quadrant pain EXAM: CT ABDOMEN AND PELVIS WITH CONTRAST TECHNIQUE: Multidetector CT imaging of the abdomen and pelvis was performed using the standard protocol following bolus administration of intravenous contrast. CONTRAST:  138mL ISOVUE-300 IOPAMIDOL (ISOVUE-300) INJECTION 61% COMPARISON:  CT abdomen pelvis 06/11/2016 FINDINGS: Lower chest: No pulmonary nodules. No visible pleural or pericardial effusion. Hepatobiliary: Normal hepatic size and contours without focal liver lesion. No perihepatic ascites. No intra- or extrahepatic biliary dilatation. Status post cholecystectomy. Pancreas: The pancreatic duct is mildly dilated, measuring 4 mm at the pancreatic neck. No focal strictures identified. The degree of dilatation is decreased compared to the prior study. Spleen: Normal. 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 The right adrenal gland is normal. No hydronephrosis or solid renal mass. Stomach/Bowel: No abnormal bowel dilatation. No bowel wall thickening or adjacent fat stranding to indicate acute inflammation. No abdominal fluid collection. Despite review of multiplanar reformatted images  in 3 orthogonal planes, the appendix could not be adequately visualized. However, there is no inflammatory stranding or free fluid within the right lower quadrant. There is diverticulosis of the descending and sigmoid colon without acute inflammation. Vascular/Lymphatic: Normal course and caliber of the major abdominal vessels. No abdominal or pelvic adenopathy. Reproductive: Status post hysterectomy.  No adnexal mass. Musculoskeletal: No lytic or blastic osseous lesion. Normal visualized extrathoracic and extraperitoneal soft tissues. Other: No contributory non-categorized findings. IMPRESSION: 1. No acute abnormality of the abdomen or pelvis. 2. Descending colonic and rectosigmoid diverticulosis without acute diverticulitis. 3. Decreased dilatation of the pancreatic duct. Electronically Signed   By: Ulyses Jarred M.D.   On: 10/17/2016 22:26  Assessment/Plan Principal Problem:   LLQ abdominal pain Active Problems:   HTN (hypertension), benign   Hepatitis C   Ulcerative colitis (HCC)   Nausea & vomiting   Abdominal pain    #1. Left lower quadrant pain with nausea vomiting - no definite evidence of ulcerative colitis exacerbation. May be related to patient taking eggnog and gastroenteritis. At this time will keep patient on full liquid diet and IV fluids. If patient's symptoms does not improve may consult patient's gastroenterologist Dr. Penelope Coop in a.m.  #2. History of ulcerative colitis - see #1. Continue Mesalamine.  #3. History of seizures on Keppra.     DVT prophylaxis: Lovenox. Code Status: Full code.  Family Communication: Discussed with patient.  Disposition Plan: Home.  Consults called: None.  Admission status: Observation.    Rise Patience MD [facility] Pager [phone].  If 7PM-7AM, please contact night-coverage [email or web address] Password [email or web address]  10/18/2016, 1:42 AM

## 2016-10-18 NOTE — Care Management Note (Signed)
Case Management Note  Patient Details  Name: Erin Wolf MRN: [id number] Date of Birth: 05/27/1956  Subjective/Objective:                 Patient in obs for abd pain. Per progression rounds with MD anticipate progressing diet today and possible DC to home if tolerates.   Action/Plan:  No CM needs identified.  Expected Discharge Date:                  Expected Discharge Plan:  Home/Self Care  In-House Referral:     Discharge planning Services  CM Consult  Post Acute Care Choice:    Choice offered to:     DME Arranged:    DME Agency:     HH Arranged:    [place] Agency:     Status of Service:  Completed, signed off  If discussed at [employer] of Stay Meetings, dates discussed:    Additional Comments:  Carles Collet, RN 10/18/2016, 12:08 PM

## 2016-10-18 NOTE — Progress Notes (Addendum)
PROGRESS NOTE                                                                                                                                                                                                             Patient Demographics:    Erin Wolf, is a 60 y.o. female, DOB - June 11, 1956, [id number]  Admit date - 10/17/2016   Admitting Physician Rise Patience, MD  Outpatient Primary MD for the patient is Shirline Frees, MD  LOS - 0  Outpatient Specialists: Dr Penelope Coop  Chief Complaint  Patient presents with  . Abdominal Pain       Brief Narrative   60 year old female with history of ulcerative colitis, irritable bowel syndrome GERD and hypertension who presented with persistent nausea and vomiting along with left lower quadrant abdominal pain for past 3 days. She ate and not during Christmas following which she had acute left lower quadrant pain radiating to the back associated with several episodes of nonbloody vomiting and mucousy stool but no diarrhea. In the ED vitals were stable. CT of the abdomen and pelvis was unremarkable for acute findings. Given her persistent pain and unable to keep anything down she was placed on observation.   Subjective:    Patient reports vomiting clear liquid this morning. Still has left low quadrant pain. Informs symptoms are typical of her ulcerative colitis.   Assessment  & Plan :    Principal Problem:   LLQ abdominal pain With nausea and vomiting Patient reports this is typical of her ulcerative colitis flare. Also informs that she has been hospitalized a few times in the past year for similar symptoms. On reviewing his record she has not been hospitalized in the past few years for these symptoms. I suspect this is likely due to acute gastroenteritis vs IBS. Continue supportive care with IV fluids and antiemetic. He liquid as tolerated. If not improved will consult  [facility]. Reportedly has chronic abdominal pain and diarrhea at baseline. EUS done on 9/1 was unremarkable Continue mesalamine.  Active Problems:    HTN (hypertension), benign   Stable.  IBS Continue dicyclomine.  Hx on seizures On Keppra.   Hypokalemia Replenished    Code Status : Full code  Family Communication  : None at bedside  Disposition Plan  : Home tomorrow if improved  Barriers For Discharge :  Active symptoms  Consults  :  None  Procedures  : CT abdomen and pelvis  DVT Prophylaxis  :  Lovenox -   Lab Results  Component Value Date   PLT 292 10/18/2016    Antibiotics  :  Anti-infectives    None        Objective:   Vitals:   10/18/16 0000 10/18/16 0135 10/18/16 0216 10/18/16 0547  BP: 108/72 134/77 (!) 147/81 125/64  Pulse: 95 89 82 89  Resp:   19 17  Temp:   97.7 F (36.5 C) 98 F (36.7 C)  TempSrc:   Oral Oral  SpO2: 93% 96% 100% 97%    Wt Readings from Last 3 Encounters:  07/01/16 108.7 kg (239 lb 9.6 oz)  06/21/16 95.3 kg (210 lb)  03/19/16 95.3 kg (210 lb)     Intake/Output Summary (Last 24 hours) at 10/18/16 1337 Last data filed at 10/18/16 0608  Gross per 24 hour  Intake           1342.5 ml  Output                0 ml  Net           1342.5 ml     Physical Exam  Gen: not in distress HEENT: Dry mucosa, supple neck Chest: clear b/l, no added sounds CVS: N S1&S2, no murmurs, GI: soft, nondistended, bowel sounds present, left lower quadrant tenderness Musculoskeletal: warm, no edema     Data Review:    CBC  Recent Labs Lab 10/17/16 1326 10/18/16 0319  WBC 5.3 5.3  HGB 14.2 12.4  HCT 43.9 38.8  PLT 342 292  MCV 80.8 81.0  MCH 26.2 25.9*  MCHC 32.3 32.0  RDW 15.0 15.0    Chemistries   Recent Labs Lab 10/17/16 1326 10/18/16 0319  NA 137 138  K 3.8 3.4*  CL 103 104  CO2 24 25  GLUCOSE 119* 102*  BUN 8 8  CREATININE 0.81 0.65  CALCIUM 9.3 8.4*  AST 20 18  ALT 22 17  ALKPHOS 65 52  BILITOT  0.3 0.4   ------------------------------------------------------------------------------------------------------------------ No results for input(s): CHOL, HDL, LDLCALC, TRIG, CHOLHDL, LDLDIRECT in the last 72 hours.  Lab Results  Component Value Date   HGBA1C 6.4 (H) 06/30/2016   ------------------------------------------------------------------------------------------------------------------ No results for input(s): TSH, T4TOTAL, T3FREE, THYROIDAB in the last 72 hours.  Invalid input(s): FREET3 ------------------------------------------------------------------------------------------------------------------ No results for input(s): VITAMINB12, FOLATE, FERRITIN, TIBC, IRON, RETICCTPCT in the last 72 hours.  Coagulation profile No results for input(s): INR, PROTIME in the last 168 hours.  No results for input(s): DDIMER in the last 72 hours.  Cardiac Enzymes No results for input(s): CKMB, TROPONINI, MYOGLOBIN in the last 168 hours.  Invalid input(s): CK ------------------------------------------------------------------------------------------------------------------ No results found for: BNP  Inpatient Medications  Scheduled Meds: . enoxaparin (LOVENOX) injection  40 mg Subcutaneous Q24H  . FLUoxetine  20 mg Oral q morning - 10a  . levETIRAcetam  1,000 mg Oral QHS  . levETIRAcetam  500 mg Oral Daily   Continuous Infusions: . sodium chloride 75 mL/hr at 10/18/16 0222   PRN Meds:.acetaminophen **OR** acetaminophen, cyclobenzaprine, fentaNYL (SUBLIMAZE) injection, ondansetron **OR** ondansetron (ZOFRAN) IV, polyvinyl alcohol  Micro Results No results found for this or any previous visit (from the past 240 hour(s)).  Radiology Reports Ct Abdomen Pelvis W Contrast  Result Date: 10/17/2016 CLINICAL DATA:  Diarrhea and left lower quadrant pain EXAM: CT ABDOMEN AND PELVIS WITH CONTRAST TECHNIQUE: Multidetector  CT imaging of the abdomen and pelvis was performed using the  standard protocol following bolus administration of intravenous contrast. CONTRAST:  176mL ISOVUE-300 IOPAMIDOL (ISOVUE-300) INJECTION 61% COMPARISON:  CT abdomen pelvis 06/11/2016 FINDINGS: Lower chest: No pulmonary nodules. No visible pleural or pericardial effusion. Hepatobiliary: Normal hepatic size and contours without focal liver lesion. No perihepatic ascites. No intra- or extrahepatic biliary dilatation. Status post cholecystectomy. Pancreas: The pancreatic duct is mildly dilated, measuring 4 mm at the pancreatic neck. No focal strictures identified. The degree of dilatation is decreased compared to the prior study. Spleen: Normal. 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 The right adrenal gland is normal. No hydronephrosis or solid renal mass. Stomach/Bowel: No abnormal bowel dilatation. No bowel wall thickening or adjacent fat stranding to indicate acute inflammation. No abdominal fluid collection. Despite review of multiplanar reformatted images in 3 orthogonal planes, the appendix could not be adequately visualized. However, there is no inflammatory stranding or free fluid within the right lower quadrant. There is diverticulosis of the descending and sigmoid colon without acute inflammation. Vascular/Lymphatic: Normal course and caliber of the major abdominal vessels. No abdominal or pelvic adenopathy. Reproductive: Status post hysterectomy.  No adnexal mass. Musculoskeletal: No lytic or blastic osseous lesion. Normal visualized extrathoracic and extraperitoneal soft tissues. Other: No contributory non-categorized findings. IMPRESSION: 1. No acute abnormality of the abdomen or pelvis. 2. Descending colonic and rectosigmoid diverticulosis without acute diverticulitis. 3. Decreased dilatation of the pancreatic duct. Electronically Signed   By: Ulyses Jarred M.D.   On: 10/17/2016 22:26    Time Spent in minutes   20   Louellen Molder M.D on 10/18/2016 at 1:37 PM  Between 7am to 7pm - Pager - [phone]  After 7pm go to [email or web address] - password [facility]  [facility] -  Office  [phone]

## 2016-10-18 NOTE — Progress Notes (Signed)
Pt arrived on the unit via bed to room 5w31.  Pt is alert and oriented x 4.  IV access in the R hand.  Started NS @ 31ml/hr. Skin assessment completed. No skin issues.  Seizures precautions in placed.  Educated the pt on the proper use the remote control and phone in the room.  Lowered the bed and placed the call bell within reach. Will continue to monitor.

## 2016-10-19 DIAGNOSIS — R112 Nausea with vomiting, unspecified: Secondary | ICD-10-CM

## 2016-10-19 MED ORDER — ONDANSETRON HCL 4 MG PO TABS: 4.0000 mg | ORAL_TABLET | Freq: Four times a day (QID) | ORAL | 0 refills | Status: DC

## 2016-10-19 NOTE — Progress Notes (Signed)
NURSING PROGRESS NOTE  Erin Wolf [id number] Discharge Data: 10/19/2016 12:26 PM Attending Provider: Louellen Molder, MD [id number], Gwyndolyn Saxon, MD   Alba Cory to be D/C'd Home per MD order.    All IV's will be discontinued and monitored for bleeding.  All belongings will be returned to patient for patient to take home.  Last Documented Vital Signs:  Blood pressure (!) 129/55, pulse 83, temperature 98 F (36.7 C), temperature source Oral, resp. rate 17, SpO2 98 %.  Joslyn Hy, MSN, RN, [employer]

## 2016-10-19 NOTE — Discharge Summary (Signed)
Physician Discharge Summary  Erin Wolf [id number] DOB: [birth year] DOA: 10/17/2016  PCP: Shirline Frees, MD  Admit date: 10/17/2016 Discharge date: 10/19/2016  Admitted From: Home Disposition:  Home  Recommendations for Outpatient Follow-up:  1. Follow up with PCP in 1-2 weeks   Home Health: None Equipment/Devices: None  Discharge Condition: Fair CODE STATUS: Full code Diet recommendation: Regular      Discharge Diagnoses:  Principal Problem:   Acute gastroenteritis   Active Problems:   HTN (hypertension), benign   Hepatitis C   Ulcerative colitis (HCC)   LLQ abdominal pain   Nausea & vomiting   History of ulcerative colitis  Brief narrative/history of present illness 60 year old female with history of ulcerative colitis, irritable bowel syndrome GERD and hypertension who presented with persistent nausea and vomiting along with left lower quadrant abdominal pain for past 3 days. She ate and not during Christmas following which she had acute left lower quadrant pain radiating to the back associated with several episodes of nonbloody vomiting and mucousy stool but no diarrhea. In the ED vitals were stable. CT of the abdomen and pelvis was unremarkable for acute findings. Given her persistent pain and unable to keep anything down she was placed on observation.  Hospital course  Principal Problem:   LLQ abdominal pain With nausea and vomiting  suspect this is likely due to acute gastroenteritis with some complement of her IBS.Marland Kitchen Provided supportive care with IV fluids and antiemetic.  -Tolerating advanced diet. Had an episode of vomiting this morning but was able to tolerate meals thereafter. Reportedly has chronic abdominal pain and diarrhea at baseline. EUS done on 9/1 was unremarkable Continue mesalamine.   Patient stable to be discharged home with outpatient follow-up.  Active Problems:    HTN (hypertension), benign   Stable.  IBS Continue  dicyclomine.  Hx on seizures On Keppra.   Hypokalemia Replenished     Family Communication  : None at bedside  Disposition Plan  : Home   Consults  :  None  Procedures  : CT abdomen and pelvis  Discharge Instructions   Allergies as of 10/19/2016      Reactions   Latex Other (See Comments)   Peels skin   Nsaids Other (See Comments)   HAS COLITIS FLARES   Penicillins Hives   Has patient had a PCN reaction causing immediate rash, facial/tongue/throat swelling, SOB or lightheadedness with hypotension: Yes Has patient had a PCN reaction causing severe rash involving mucus membranes or skin necrosis: Unknown Has patient had a PCN reaction that required hospitalization: Unknown Has patient had a PCN reaction occurring within the last 10 years: No If all of the above answers are "NO", then may proceed with Cephalosporin use.   Wellbutrin [bupropion Hcl] Other (See Comments)   Insomnia and headaches   Aspirin Nausea Only   Darvocet [propoxyphene N-acetaminophen] Nausea And Vomiting   Isometheptene-dichloral-apap Nausea Only   IV dye?   Tape Hives   Toradol [ketorolac Tromethamine] Nausea And Vomiting   Tylenol [acetaminophen] Nausea Only   Dilaudid [hydromorphone Hcl] Itching, Nausea Only, Rash      Medication List    STOP taking these medications   dicyclomine 20 MG tablet Commonly known as:  BENTYL   ibuprofen 200 MG tablet Commonly known as:  ADVIL,MOTRIN     TAKE these medications   CENTRUM SILVER 50+WOMEN Tabs Take 1 tablet by mouth every morning.   cyclobenzaprine 5 MG tablet Commonly known as:  FLEXERIL Take 1 tablet (5  mg total) by mouth 3 (three) times daily as needed for muscle spasms.   FLUoxetine 20 MG capsule Commonly known as:  PROZAC Take 20 mg by mouth every morning.   levETIRAcetam 500 MG 24 hr tablet Commonly known as:  KEPPRA XR Take 1-2 tablets (500-1,000 mg total) by mouth 2 (two) times daily. Take 1 tablet in the mornings  and 2 tabs at bedtime.   mesalamine 4 g enema Commonly known as:  ROWASA Place 4 g rectally at bedtime as needed (colitis flare up.).   ondansetron 4 MG tablet Commonly known as:  ZOFRAN Take 1 tablet (4 mg total) by mouth every 6 (six) hours.   SYSTANE BALANCE OP Place 2 drops into both eyes 2 (two) times daily as needed (dry eyes).   triamterene-hydrochlorothiazide 37.5-25 MG tablet Commonly known as:  MAXZIDE-25 Take 1 tablet by mouth daily.      Follow-up Information    Shirline Frees, MD. Schedule an appointment as soon as possible for a visit in 1 week(s).   Specialty:  Family Medicine Contact information: [place] [address] [phone]          Allergies  Allergen Reactions  . Latex Other (See Comments)    Peels skin  . Nsaids Other (See Comments)    HAS COLITIS FLARES  . Penicillins Hives    Has patient had a PCN reaction causing immediate rash, facial/tongue/throat swelling, SOB or lightheadedness with hypotension: Yes Has patient had a PCN reaction causing severe rash involving mucus membranes or skin necrosis: Unknown Has patient had a PCN reaction that required hospitalization: Unknown Has patient had a PCN reaction occurring within the last 10 years: No If all of the above answers are "NO", then may proceed with Cephalosporin use.   . Wellbutrin [Bupropion Hcl] Other (See Comments)    Insomnia and headaches  . Aspirin Nausea Only  . Darvocet [Propoxyphene N-Acetaminophen] Nausea And Vomiting  . Isometheptene-Dichloral-Apap Nausea Only    IV dye?  Marland Kitchen Tape Hives  . Toradol [Ketorolac Tromethamine] Nausea And Vomiting  . Tylenol [Acetaminophen] Nausea Only  . Dilaudid [Hydromorphone Hcl] Itching, Nausea Only and Rash        Procedures/Studies: Ct Abdomen Pelvis W Contrast  Result Date: 10/17/2016 CLINICAL DATA:  Diarrhea and left lower quadrant pain EXAM: CT ABDOMEN AND PELVIS WITH CONTRAST TECHNIQUE:  Multidetector CT imaging of the abdomen and pelvis was performed using the standard protocol following bolus administration of intravenous contrast. CONTRAST:  140mL ISOVUE-300 IOPAMIDOL (ISOVUE-300) INJECTION 61% COMPARISON:  CT abdomen pelvis 06/11/2016 FINDINGS: Lower chest: No pulmonary nodules. No visible pleural or pericardial effusion. Hepatobiliary: Normal hepatic size and contours without focal liver lesion. No perihepatic ascites. No intra- or extrahepatic biliary dilatation. Status post cholecystectomy. Pancreas: The pancreatic duct is mildly dilated, measuring 4 mm at the pancreatic neck. No focal strictures identified. The degree of dilatation is decreased compared to the prior study. Spleen: Normal. 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 The right adrenal gland is normal. No hydronephrosis or solid renal mass. Stomach/Bowel: No abnormal bowel dilatation. No bowel wall thickening or adjacent fat stranding to indicate acute inflammation. No abdominal fluid collection. Despite review of multiplanar reformatted images in 3 orthogonal planes, the appendix could not be adequately visualized. However, there is no inflammatory stranding or free fluid within the right lower quadrant. There is diverticulosis of the descending and sigmoid colon without acute inflammation.  Vascular/Lymphatic: Normal course and caliber of the major abdominal vessels. No abdominal or pelvic adenopathy. Reproductive: Status post hysterectomy.  No adnexal mass. Musculoskeletal: No lytic or blastic osseous lesion. Normal visualized extrathoracic and extraperitoneal soft tissues. Other: No contributory non-categorized findings. IMPRESSION: 1. No acute abnormality of the abdomen or pelvis. 2. Descending colonic and rectosigmoid diverticulosis without acute diverticulitis. 3. Decreased dilatation of the pancreatic duct. Electronically Signed   By: Ulyses Jarred M.D.   On: 10/17/2016 22:26       Subjective: Was able to tolerate advanced diet yesterday. Had an episode of vomiting this morning. No further symptoms and tolerating diet. Left lower quadrant pain has resolved.  Discharge Exam: Vitals:   10/18/16 2242 10/19/16 0534  BP: (!) 146/67 (!) 129/55  Pulse: 96 83  Resp: 17 17  Temp: 98.2 F (36.8 C) 98 F (36.7 C)   Vitals:   10/18/16 0547 10/18/16 1743 10/18/16 2242 10/19/16 0534  BP: 125/64 139/71 (!) 146/67 (!) 129/55  Pulse: 89 88 96 83  Resp: 17 20 17 17   Temp: 98 F (36.7 C) 97.7 F (36.5 C) 98.2 F (36.8 C) 98 F (36.7 C)  TempSrc: Oral Oral Oral Oral  SpO2: 97% 96% 100% 98%    Gen: not in distress HEENT: Moist mucosa, supple neck Chest: clear b/l, no added sounds CVS: N S1&S2, no murmurs, GI: soft, nondistended, bowel sounds present, nontender  Musculoskeletal: warm, no edema     The results of significant diagnostics from this hospitalization (including imaging, microbiology, ancillary and laboratory) are listed below for reference.     Microbiology: No results found for this or any previous visit (from the past 240 hour(s)).   Labs: BNP (last 3 results) No results for input(s): BNP in the last 8760 hours. Basic Metabolic Panel:  Recent Labs Lab 10/17/16 1326 10/18/16 0319  NA 137 138  K 3.8 3.4*  CL 103 104  CO2 24 25  GLUCOSE 119* 102*  BUN 8 8  CREATININE 0.81 0.65  CALCIUM 9.3 8.4*   Liver Function Tests:  Recent Labs Lab 10/17/16 1326 10/18/16 0319  AST 20 18  ALT 22 17  ALKPHOS 65 52  BILITOT 0.3 0.4  PROT 7.8 6.7  ALBUMIN 3.5 3.0*    Recent Labs Lab 10/17/16 1326  LIPASE 23   No results for input(s): AMMONIA in the last 168 hours. CBC:  Recent Labs Lab 10/17/16 1326 10/18/16 0319  WBC 5.3 5.3  HGB 14.2 12.4  HCT 43.9 38.8  MCV 80.8 81.0  PLT 342 292   Cardiac Enzymes: No results for input(s): CKTOTAL, CKMB, CKMBINDEX, TROPONINI in the last 168  hours. BNP: Invalid input(s): POCBNP CBG: No results for input(s): GLUCAP in the last 168 hours. D-Dimer No results for input(s): DDIMER in the last 72 hours. Hgb A1c No results for input(s): HGBA1C in the last 72 hours. Lipid Profile No results for input(s): CHOL, HDL, LDLCALC, TRIG, CHOLHDL, LDLDIRECT in the last 72 hours. Thyroid function studies No results for input(s): TSH, T4TOTAL, T3FREE, THYROIDAB in the last 72 hours.  Invalid input(s): FREET3 Anemia work up No results for input(s): VITAMINB12, FOLATE, FERRITIN, TIBC, IRON, RETICCTPCT in the last 72 hours. Urinalysis    Component Value Date/Time   COLORURINE YELLOW 10/17/2016 1930   APPEARANCEUR HAZY (A) 10/17/2016 1930   LABSPEC 1.025 10/17/2016 1930   PHURINE 5.0 10/17/2016 1930   GLUCOSEU NEGATIVE 10/17/2016 1930   HGBUR NEGATIVE 10/17/2016 [place] NEGATIVE 10/17/2016 1930  KETONESUR NEGATIVE 10/17/2016 1930   PROTEINUR NEGATIVE 10/17/2016 1930   UROBILINOGEN 0.2 04/17/2015 0644   NITRITE NEGATIVE 10/17/2016 1930   LEUKOCYTESUR NEGATIVE 10/17/2016 1930   Sepsis Labs Invalid input(s): PROCALCITONIN,  WBC,  LACTICIDVEN Microbiology No results found for this or any previous visit (from the past 240 hour(s)).   Time coordinating discharge: < 30 minutes  SIGNED:   Louellen Molder, MD  [facility] 10/19/2016, 10:40 AM Pager   If 7PM-7AM, please contact night-coverage [email or web address] Password [email or web address]

## 2016-10-24 DIAGNOSIS — M545 Low back pain: Secondary | ICD-10-CM | POA: Diagnosis not present

## 2016-10-24 DIAGNOSIS — I10 Essential (primary) hypertension: Secondary | ICD-10-CM | POA: Diagnosis not present

## 2016-10-24 DIAGNOSIS — K5190 Ulcerative colitis, unspecified, without complications: Secondary | ICD-10-CM | POA: Diagnosis not present

## 2016-10-24 DIAGNOSIS — R569 Unspecified convulsions: Secondary | ICD-10-CM | POA: Diagnosis not present

## 2016-10-24 DIAGNOSIS — E7800 Pure hypercholesterolemia, unspecified: Secondary | ICD-10-CM | POA: Diagnosis not present

## 2016-10-24 DIAGNOSIS — F324 Major depressive disorder, single episode, in partial remission: Secondary | ICD-10-CM | POA: Diagnosis not present

## 2016-10-24 DIAGNOSIS — R1084 Generalized abdominal pain: Secondary | ICD-10-CM | POA: Diagnosis not present

## 2016-11-12 DIAGNOSIS — M2550 Pain in unspecified joint: Secondary | ICD-10-CM | POA: Insufficient documentation

## 2016-11-12 DIAGNOSIS — M1380 Other specified arthritis, unspecified site: Secondary | ICD-10-CM | POA: Insufficient documentation

## 2016-11-12 DIAGNOSIS — Z79899 Other long term (current) drug therapy: Secondary | ICD-10-CM | POA: Insufficient documentation

## 2016-11-12 DIAGNOSIS — M791 Myalgia, unspecified site: Secondary | ICD-10-CM | POA: Insufficient documentation

## 2016-11-12 DIAGNOSIS — M1990 Unspecified osteoarthritis, unspecified site: Secondary | ICD-10-CM | POA: Diagnosis not present

## 2017-04-08 ENCOUNTER — Emergency Department (HOSPITAL_COMMUNITY)
Admission: EM | Admit: 2017-04-08 | Discharge: 2017-04-09 | Disposition: A | Discharge status: Home / Self Care | Payer: Medicare Other | Attending: Emergency Medicine | Admitting: Emergency Medicine

## 2017-04-08 ENCOUNTER — Emergency Department (HOSPITAL_COMMUNITY): Payer: Medicare Other

## 2017-04-08 ENCOUNTER — Other Ambulatory Visit: Payer: Self-pay

## 2017-04-08 DIAGNOSIS — Z8719 Personal history of other diseases of the digestive system: Secondary | ICD-10-CM | POA: Diagnosis not present

## 2017-04-08 DIAGNOSIS — R1084 Generalized abdominal pain: Secondary | ICD-10-CM | POA: Insufficient documentation

## 2017-04-08 DIAGNOSIS — R079 Chest pain, unspecified: Secondary | ICD-10-CM | POA: Diagnosis not present

## 2017-04-08 DIAGNOSIS — I10 Essential (primary) hypertension: Secondary | ICD-10-CM | POA: Insufficient documentation

## 2017-04-08 DIAGNOSIS — Z91040 Latex allergy status: Secondary | ICD-10-CM | POA: Diagnosis not present

## 2017-04-08 DIAGNOSIS — Z79899 Other long term (current) drug therapy: Secondary | ICD-10-CM | POA: Diagnosis not present

## 2017-04-08 LAB — CBC
HEMATOCRIT: 42.9 % (ref 36.0–46.0)
Hemoglobin: 14 g/dL (ref 12.0–15.0)
MCH: 25.9 pg — ABNORMAL LOW (ref 26.0–34.0)
MCHC: 32.6 g/dL (ref 30.0–36.0)
MCV: 79.3 fL (ref 78.0–100.0)
PLATELETS: 327 10*3/uL (ref 150–400)
RBC: 5.41 MIL/uL — ABNORMAL HIGH (ref 3.87–5.11)
RDW: 15.4 % (ref 11.5–15.5)
WBC: 4.7 10*3/uL (ref 4.0–10.5)

## 2017-04-08 LAB — COMPREHENSIVE METABOLIC PANEL
ALBUMIN: 3.8 g/dL (ref 3.5–5.0)
ALT: 25 U/L (ref 14–54)
AST: 16 U/L (ref 15–41)
Alkaline Phosphatase: 75 U/L (ref 38–126)
Anion gap: 10 (ref 5–15)
BILIRUBIN TOTAL: 0.5 mg/dL (ref 0.3–1.2)
BUN: 10 mg/dL (ref 6–20)
CHLORIDE: 107 mmol/L (ref 101–111)
CO2: 19 mmol/L — AB (ref 22–32)
Calcium: 9.3 mg/dL (ref 8.9–10.3)
Creatinine, Ser: 0.66 mg/dL (ref 0.44–1.00)
GFR calc Af Amer: >60 mL/min (ref >60)
GFR calc non Af Amer: >60 mL/min (ref >60)
GLUCOSE: 124 mg/dL — AB (ref 65–99)
Potassium: 3.7 mmol/L (ref 3.5–5.1)
SODIUM: 136 mmol/L (ref 135–145)
TOTAL PROTEIN: 7.9 g/dL (ref 6.5–8.1)

## 2017-04-08 LAB — URINALYSIS, ROUTINE W REFLEX MICROSCOPIC
Bilirubin Urine: NEGATIVE
GLUCOSE, UA: NEGATIVE mg/dL
HGB URINE DIPSTICK: NEGATIVE
Ketones, ur: 5 mg/dL — AB
LEUKOCYTES UA: NEGATIVE
Nitrite: NEGATIVE
PH: 6 (ref 5.0–8.0)
Protein, ur: NEGATIVE mg/dL
SPECIFIC GRAVITY, URINE: 1.023 (ref 1.005–1.030)

## 2017-04-08 LAB — I-STAT CG4 LACTIC ACID, ED: Lactic Acid, Venous: 1.37 mmol/L (ref 0.5–1.9)

## 2017-04-08 LAB — LIPASE, BLOOD: Lipase: 25 U/L (ref 11–51)

## 2017-04-08 LAB — I-STAT TROPONIN, ED: Troponin i, poc: 0 ng/mL (ref 0.00–0.08)

## 2017-04-08 MED ORDER — MORPHINE SULFATE (PF) 4 MG/ML IV SOLN
4.0000 mg | Freq: Once | INTRAVENOUS | Status: AC
  Administered 2017-04-08: 4 mg via INTRAVENOUS
  Filled 2017-04-08: qty 1

## 2017-04-08 MED ORDER — SODIUM CHLORIDE 0.9 % IV BOLUS (SEPSIS)
1000.0000 mL | Freq: Once | INTRAVENOUS | Status: AC
  Administered 2017-04-08: 1000 mL via INTRAVENOUS

## 2017-04-08 MED ORDER — ONDANSETRON 4 MG PO TBDP
8.0000 mg | ORAL_TABLET | Freq: Once | ORAL | Status: AC
  Administered 2017-04-08: 8 mg via ORAL
  Filled 2017-04-08: qty 2

## 2017-04-08 NOTE — ED Triage Notes (Signed)
Per Pt, Pt is coming from home with Hx of Ulcerative Colitis. Pt reports flair up on Sunday with N/V/D and some chest pain. Reports 10/10 RUQ pain along with mid-center chest pain.

## 2017-04-08 NOTE — ED Provider Notes (Signed)
[facility] DEPT Provider Note   CSN: [id number] Arrival date & time: 04/08/17  1519     History   Chief Complaint Chief Complaint  Patient presents with  . Chest Pain  . Abdominal Pain    HPI Erin Wolf is a 61 y.o. female.  Patient with history of GERD, Hepatitis C, HLD, HTN, Ulcerative Colitis presents with symptoms typical of a UC flare, having diarrhea (about 8 per day) and nausea/vomiting (about 4 per day) x 2 days, associated with low back pain. No bloody stools or hematemesis. She reports a fever yesterday of 102. She feels dehydrated and increasing weak. She reports being unable to eat or drink anything at home because of symptoms. No SOB. She reports chest pain that is burning type chest pain that she associates with vomiting. No urinary symptoms.    The history is provided by the patient. No language interpreter was used.  Chest Pain   Associated symptoms include abdominal pain, back pain, a fever, nausea, vomiting and weakness. Pertinent negatives include no headaches.  Abdominal Pain   Associated symptoms include fever, diarrhea, nausea and vomiting. Pertinent negatives include dysuria and headaches.    Past Medical History:  Diagnosis Date  . Anxiety   . Chest pain, exertional   . Chronic abdominal pain   . Chronic lower back pain   . Depression 01/03/2012  . Diverticulosis   . 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 Fibroids 01/03/2012  . GERD (gastroesophageal reflux disease)   . Hepatitis C    "signs of; not active" (05/28/2013)  . HTN (hypertension), benign 01/03/2012  . Hyperlipidemia 01/03/2012  . Hypertension   . IBS (irritable bowel syndrome)   . Migraines    "a few times/yr" (05/28/2013)  . Nausea and vomiting    chronic, recurrent  . Seizure ([place])    "today; < 1 yr ago, they just happen; started having them in my 20's" (05/28/2013)  . Seizure disorder ([place])   . SLE (systemic lupus erythematosus) ([place])   . Ulcerative colitis     Patient Active  Problem List   Diagnosis Date Noted  . Nausea & vomiting 10/18/2016  . History of ulcerative colitis   . LLQ abdominal pain 10/17/2016  . Syncope and collapse 06/29/2016  . Left-sided weakness 06/29/2016  . Facial twitching 06/29/2016  . IBS (irritable bowel syndrome) 06/29/2016  . Faintness   . Dehydration 05/09/2014  . GERD (gastroesophageal reflux disease) 05/09/2014  . Gastroenteritis 05/09/2014  . Chest pain, atypical 05/09/2014  . Chronic ulcerative colitis ([place]) 05/09/2014  . Acute gastroenteritis 05/09/2014  . Ulcerative colitis ([place]) 05/27/2013  . Hypokalemia 05/27/2013  . Anemia 01/04/2012  . Transaminitis 01/04/2012  . Diarrhea 01/04/2012  . Chest pain 01/03/2012  . SOB (shortness of breath) 01/03/2012  . HTN (hypertension), benign 01/03/2012  . Hx of migraines 01/03/2012  . Seizure ([place]) 01/03/2012  . Depression 01/03/2012  . Ulcerative colitis 01/03/2012  . Chronic back pain 01/03/2012  . 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 Hyperlipidemia 01/03/2012  . Hepatitis C 01/03/2012  . Fibroids 01/03/2012    Past Surgical History:  Procedure Laterality Date  . ABDOMINAL HYSTERECTOMY  1978   "partial" (05/28/2013)'fibroids"  . BILATERAL OOPHORECTOMY Bilateral 1987  . CHOLECYSTECTOMY  1978  . COLONOSCOPY N/A 05/31/2013   Procedure: COLONOSCOPY;  Surgeon: Wonda Horner, MD;  Location: [facility] ENDOSCOPY;  Service: Endoscopy;  Laterality: N/A;  . ESOPHAGOGASTRODUODENOSCOPY (EGD) WITH PROPOFOL N/A 05/11/2014   Procedure: ESOPHAGOGASTRODUODENOSCOPY (EGD) WITH PROPOFOL;  Surgeon: Loanne Drilling.  Michail Sermon, MD;  Location: [facility] ENDOSCOPY;  Service: Endoscopy;  Laterality: N/A;  egd first  . EUS N/A 06/21/2016   Procedure: ESOPHAGEAL ENDOSCOPIC ULTRASOUND (EUS) RADIAL;  Surgeon: Arta Silence, MD;  Location: [facility] ENDOSCOPY;  Service: Endoscopy;  Laterality: N/A;  . FLEXIBLE SIGMOIDOSCOPY N/A 05/11/2014   Procedure: FLEXIBLE SIGMOIDOSCOPY;  Surgeon: Lear Ng, MD;  Location: [facility] ENDOSCOPY;   Service: Endoscopy;  Laterality: N/A;  . TUBAL LIGATION      OB History    No data available       Home Medications    Prior to Admission medications   Medication Sig Start Date End Date Taking? Authorizing Provider  cyclobenzaprine (FLEXERIL) 5 MG tablet Take 1 tablet (5 mg total) by mouth 3 (three) times daily as needed for muscle spasms. 10/01/14   Billy Fischer, MD  FLUoxetine (PROZAC) 20 MG capsule Take 20 mg by mouth every morning.    [provider]  levETIRAcetam (KEPPRA XR) 500 MG 24 hr tablet Take 1-2 tablets (500-1,000 mg total) by mouth 2 (two) times daily. Take 1 tablet in the mornings and 2 tabs at bedtime. 07/01/16   Hongalgi, Lenis Dickinson, MD  mesalamine (ROWASA) 4 G enema Place 4 g rectally at bedtime as needed (colitis flare up.).     [provider]  Multiple Vitamins-Minerals (CENTRUM SILVER 50+WOMEN) TABS Take 1 tablet by mouth every morning.    [provider]  ondansetron (ZOFRAN) 4 MG tablet Take 1 tablet (4 mg total) by mouth every 6 (six) hours. 10/19/16   Dhungel, Flonnie Overman, MD  Propylene Glycol (SYSTANE BALANCE OP) Place 2 drops into both eyes 2 (two) times daily as needed (dry eyes).     [provider]  triamterene-hydrochlorothiazide (MAXZIDE-25) 37.5-25 MG tablet Take 1 tablet by mouth daily. 09/30/16   [provider]    Family History Family History  Problem Relation Age of Onset  . Colon cancer Brother     Social History Social History  Substance Use Topics  . Smoking status: Never Smoker  . Smokeless tobacco: Never Used  . Alcohol use Yes     Comment: 05/28/2013 "glass of wine rarely; weddings, etc"     Allergies   Latex; Nsaids; Penicillins; Wellbutrin [bupropion hcl]; Aspirin; Darvocet [propoxyphene n-acetaminophen]; Isometheptene-dichloral-apap; Tape; Toradol [ketorolac tromethamine]; Tylenol [acetaminophen]; and Dilaudid [hydromorphone hcl]   Review of Systems Review of Systems  Constitutional:  Positive for fever.  HENT: Negative.   Respiratory: Negative.   Cardiovascular: Positive for chest pain.  Gastrointestinal: Positive for abdominal pain, diarrhea, nausea and vomiting. Negative for blood in stool.  Genitourinary: Negative for difficulty urinating and dysuria.  Musculoskeletal: Positive for back pain.  Skin: Negative.  Negative for rash.  Neurological: Positive for weakness and light-headedness. Negative for syncope and headaches.     Physical Exam Updated Vital Signs BP 127/64 (BP Location: Right Arm)   Pulse 92   Temp 97.3 F (36.3 C) (Oral)   Resp 18   Ht 5\' 4"  (1.626 m)   Wt 102.1 kg (225 lb)   SpO2 99%   BMI 38.62 kg/m   Physical Exam  Constitutional: She is oriented to person, place, and time. She appears well-developed and well-nourished.  HENT:  Head: Normocephalic.  Neck: Normal range of motion. Neck supple.  Cardiovascular: Normal rate and regular rhythm.   No murmur heard. Pulmonary/Chest: Effort normal and breath sounds normal. She has no wheezes. She has no rales.  Abdominal: Soft. Bowel sounds are normal. There is  tenderness (Tender across lower abdomen.). There is no rebound and no guarding.  Musculoskeletal: Normal range of motion.  Neurological: She is alert and oriented to person, place, and time.  Skin: Skin is warm and dry. No rash noted.  Psychiatric: She has a normal mood and affect.     ED Treatments / Results  Labs (all labs ordered are listed, but only abnormal results are displayed) Labs Reviewed  CBC - Abnormal; Notable for the following:       Result Value   RBC 5.41 (*)    MCH 25.9 (*)    All other components within normal limits  COMPREHENSIVE METABOLIC PANEL - Abnormal; Notable for the following:    CO2 19 (*)    Glucose, Bld 124 (*)    All other components within normal limits  URINALYSIS, ROUTINE W REFLEX MICROSCOPIC - Abnormal; Notable for the following:    APPearance HAZY (*)    Ketones, ur 5 (*)    All other  components within normal limits  LIPASE, BLOOD  I-STAT TROPOININ, ED  I-STAT CG4 LACTIC ACID, ED  I-STAT CG4 LACTIC ACID, ED    EKG  EKG Interpretation None       Radiology Dg Chest 2 View  Result Date: 04/08/2017 CLINICAL DATA:  Mid to left-sided chest pain began yesterday. Symptoms have worsened today inter associated with left shoulder and left upper extremity pain and tingling. Left foot numbness as well. EXAM: CHEST  2 VIEW COMPARISON:  Chest x-ray of June 29, 2016 and chest CT scan of June 30, 2016. FINDINGS: The lungs are adequately inflated and clear. The heart and pulmonary vascularity are normal. The mediastinum is normal in width. There is no pleural effusion. The bony thorax exhibits no acute abnormality. IMPRESSION: There is no pneumonia, CHF, nor other acute cardiopulmonary abnormality. Electronically Signed   By: David  Martinique M.D.   On: 04/08/2017 16:03    Procedures Procedures (including critical care time)  Medications Ordered in ED Medications  ondansetron (ZOFRAN-ODT) disintegrating tablet 8 mg (not administered)  sodium chloride 0.9 % bolus 1,000 mL (not administered)     Initial Impression / Assessment and Plan / ED Course  I have reviewed the triage vital signs and the nursing notes.  Pertinent labs & imaging results that were available during my care of the patient were reviewed by me and considered in my medical decision making (see chart for details).     Patient presents with abdominal pain, N, V, D c/w ulcerative colitis flare. Fever last night, none today in the ED. Decrease PO intake. Continues to urinate.   While in the ED, the patient was up to the bathroom and became lightheaded. She called for assistance and was witnessed to have a syncope episode. No fall or injury. Upon putting her back into bed, she has a 2nd episode also lasting seconds. No subsequent vomiting. No seizure.   2 Liters fluids provided with improvement in dizziness  on standing. Pain/cramping relieved with IV Levsin. No vomiting or diarrhea in the ED. VSS.   The patient is felt appropriate for discharge home. Discussed importance of close follow up and return precautions. Patient ambulated without dizziness.   Final Clinical Impressions(s) / ED Diagnoses   Final diagnoses:  None   1. Abdominal pain 2. History of Ulcerative colitis  New Prescriptions New Prescriptions   No medications on file     Charlann Lange, Hershal Coria 04/09/17 0448    Daleen Bo, MD 04/09/17 [phone]

## 2017-04-08 NOTE — ED Notes (Signed)
This RN attempted IV x 3 (per patient request) with no access.  Will consult to IV team, per patient request.

## 2017-04-08 NOTE — ED Notes (Signed)
Patient continues to ask for pain medication.  PA made aware.

## 2017-04-08 NOTE — ED Notes (Addendum)
Patient asking for pain medication.

## 2017-04-08 NOTE — ED Notes (Signed)
Upon walking out of the bathroom pt stated "I feel very dizzy I need help." This RN and Clarise Cruz, student RN assisted pt onto office chair from the nursing station. Upon sitting down, pt had brief syncopal episode lasting approx 10 seconds. Pt was assisted on to stretcher in the hallway with help from this nurse and IV team. Upon sitting on the stretcher, pt lost consciousness a second time for approx 5 seconds. Pt alert and oriented to self. Pt states "I remember having an episode of diarrhea and when I stood up I felt very dizzy and everything was spinning. I don't remember passing out but I heard people talking." Nehemiah Settle, [place] and Dr. Eulis Foster aware.

## 2017-04-09 MED ORDER — HYOSCYAMINE SULFATE SL 0.125 MG SL SUBL: 0.1250 mg | SUBLINGUAL_TABLET | SUBLINGUAL | 0 refills | Status: DC | PRN

## 2017-04-09 MED ORDER — PROMETHAZINE HCL 25 MG/ML IJ SOLN
12.5000 mg | Freq: Once | INTRAMUSCULAR | Status: AC
  Administered 2017-04-09: 12.5 mg via INTRAVENOUS
  Filled 2017-04-09: qty 1

## 2017-04-09 MED ORDER — PROMETHAZINE HCL 25 MG PO TABS: 25.0000 mg | ORAL_TABLET | Freq: Four times a day (QID) | ORAL | 0 refills | Status: DC | PRN

## 2017-04-09 MED ORDER — HYOSCYAMINE SULFATE 0.5 MG/ML IJ SOLN
0.1250 mg | Freq: Once | INTRAMUSCULAR | Status: AC
  Administered 2017-04-09: 0.125 mg via INTRAVENOUS
  Filled 2017-04-09: qty 0.25

## 2017-04-09 MED ORDER — SODIUM CHLORIDE 0.9 % IV BOLUS (SEPSIS)
1000.0000 mL | Freq: Once | INTRAVENOUS | Status: AC
  Administered 2017-04-09: 1000 mL via INTRAVENOUS

## 2017-04-09 NOTE — ED Notes (Signed)
Pt ambulated independently. Felt unsteady while not holding on to the hand rails but maintained a normal gait.

## 2017-04-09 NOTE — Discharge Instructions (Signed)
Take medications as prescribed. Follow up with your doctor for recheck in 1-2 days. If symptoms worsen - high fever, severe pain, uncontrolled vomiting - return to the emergency room for further evaluation

## 2017-04-10 ENCOUNTER — Encounter (HOSPITAL_COMMUNITY): Payer: Self-pay | Admitting: Emergency Medicine

## 2017-04-10 ENCOUNTER — Emergency Department (HOSPITAL_COMMUNITY)
Admission: EM | Admit: 2017-04-10 | Discharge: 2017-04-10 | Disposition: A | Discharge status: Home / Self Care | Payer: Medicare Other | Attending: Emergency Medicine | Admitting: Emergency Medicine

## 2017-04-10 DIAGNOSIS — Z8719 Personal history of other diseases of the digestive system: Secondary | ICD-10-CM | POA: Diagnosis not present

## 2017-04-10 DIAGNOSIS — Z885 Allergy status to narcotic agent status: Secondary | ICD-10-CM | POA: Diagnosis not present

## 2017-04-10 DIAGNOSIS — Z880 Allergy status to penicillin: Secondary | ICD-10-CM | POA: Diagnosis not present

## 2017-04-10 DIAGNOSIS — R1030 Lower abdominal pain, unspecified: Secondary | ICD-10-CM | POA: Diagnosis not present

## 2017-04-10 DIAGNOSIS — I10 Essential (primary) hypertension: Secondary | ICD-10-CM | POA: Insufficient documentation

## 2017-04-10 DIAGNOSIS — Z79899 Other long term (current) drug therapy: Secondary | ICD-10-CM | POA: Insufficient documentation

## 2017-04-10 DIAGNOSIS — Z91040 Latex allergy status: Secondary | ICD-10-CM | POA: Insufficient documentation

## 2017-04-10 DIAGNOSIS — R1032 Left lower quadrant pain: Secondary | ICD-10-CM | POA: Diagnosis not present

## 2017-04-10 DIAGNOSIS — R112 Nausea with vomiting, unspecified: Secondary | ICD-10-CM | POA: Insufficient documentation

## 2017-04-10 DIAGNOSIS — R1031 Right lower quadrant pain: Secondary | ICD-10-CM | POA: Diagnosis not present

## 2017-04-10 LAB — COMPREHENSIVE METABOLIC PANEL
ALBUMIN: 3.5 g/dL (ref 3.5–5.0)
ALT: 17 U/L (ref 14–54)
ANION GAP: 8 (ref 5–15)
AST: 15 U/L (ref 15–41)
Alkaline Phosphatase: 61 U/L (ref 38–126)
BILIRUBIN TOTAL: 0.5 mg/dL (ref 0.3–1.2)
BUN: 8 mg/dL (ref 6–20)
CO2: 23 mmol/L (ref 22–32)
Calcium: 8.9 mg/dL (ref 8.9–10.3)
Chloride: 109 mmol/L (ref 101–111)
Creatinine, Ser: 0.62 mg/dL (ref 0.44–1.00)
GFR calc Af Amer: >60 mL/min (ref >60)
GFR calc non Af Amer: >60 mL/min (ref >60)
GLUCOSE: 94 mg/dL (ref 65–99)
POTASSIUM: 3.5 mmol/L (ref 3.5–5.1)
SODIUM: 140 mmol/L (ref 135–145)
TOTAL PROTEIN: 7.1 g/dL (ref 6.5–8.1)

## 2017-04-10 LAB — URINALYSIS, ROUTINE W REFLEX MICROSCOPIC
BILIRUBIN URINE: NEGATIVE
Glucose, UA: NEGATIVE mg/dL
Hgb urine dipstick: NEGATIVE
Ketones, ur: 5 mg/dL — AB
LEUKOCYTES UA: NEGATIVE
NITRITE: NEGATIVE
PH: 5 (ref 5.0–8.0)
Protein, ur: NEGATIVE mg/dL
SPECIFIC GRAVITY, URINE: 1.027 (ref 1.005–1.030)

## 2017-04-10 LAB — LIPASE, BLOOD: LIPASE: 26 U/L (ref 11–51)

## 2017-04-10 LAB — CBC
HEMATOCRIT: 40.5 % (ref 36.0–46.0)
HEMOGLOBIN: 12.8 g/dL (ref 12.0–15.0)
MCH: 25.8 pg — ABNORMAL LOW (ref 26.0–34.0)
MCHC: 31.6 g/dL (ref 30.0–36.0)
MCV: 81.5 fL (ref 78.0–100.0)
Platelets: 291 10*3/uL (ref 150–400)
RBC: 4.97 MIL/uL (ref 3.87–5.11)
RDW: 15.8 % — ABNORMAL HIGH (ref 11.5–15.5)
WBC: 4.3 10*3/uL (ref 4.0–10.5)

## 2017-04-10 LAB — POC OCCULT BLOOD, ED: Fecal Occult Bld: NEGATIVE

## 2017-04-10 MED ORDER — DIPHENHYDRAMINE HCL 50 MG/ML IJ SOLN
25.0000 mg | Freq: Once | INTRAMUSCULAR | Status: AC
  Administered 2017-04-10: 25 mg via INTRAVENOUS
  Filled 2017-04-10: qty 1

## 2017-04-10 MED ORDER — SODIUM CHLORIDE 0.9 % IV SOLN: 1000.0000 mL | INTRAVENOUS | Status: DC

## 2017-04-10 MED ORDER — HYOSCYAMINE SULFATE SL 0.125 MG SL SUBL: 0.1250 mg | SUBLINGUAL_TABLET | Freq: Four times a day (QID) | SUBLINGUAL | 0 refills | Status: DC | PRN

## 2017-04-10 MED ORDER — ONDANSETRON 4 MG PO TBDP: 4.0000 mg | ORAL_TABLET | ORAL | 0 refills | Status: DC | PRN

## 2017-04-10 MED ORDER — MESALAMINE 1000 MG RE SUPP: 1000.0000 mg | Freq: Every day | RECTAL | 12 refills | Status: DC

## 2017-04-10 MED ORDER — METOCLOPRAMIDE HCL 5 MG/ML IJ SOLN
10.0000 mg | Freq: Once | INTRAMUSCULAR | Status: AC
  Administered 2017-04-10: 10 mg via INTRAVENOUS
  Filled 2017-04-10: qty 2

## 2017-04-10 MED ORDER — SODIUM CHLORIDE 0.9 % IV BOLUS (SEPSIS)
1000.0000 mL | Freq: Once | INTRAVENOUS | Status: AC
  Administered 2017-04-10: 1000 mL via INTRAVENOUS

## 2017-04-10 MED ORDER — HYOSCYAMINE SULFATE 0.125 MG PO TBDP
0.1250 mg | ORAL_TABLET | Freq: Once | ORAL | Status: AC
  Administered 2017-04-10: 0.125 mg via ORAL
  Filled 2017-04-10 (×2): qty 1

## 2017-04-10 MED ORDER — METOCLOPRAMIDE HCL 10 MG PO TABS: 10.0000 mg | ORAL_TABLET | Freq: Four times a day (QID) | ORAL | 0 refills | Status: DC

## 2017-04-10 NOTE — ED Triage Notes (Signed)
Pt c/o RUQ 10/10 abd pain, pt states she was seen here few days and sent home, she still not able to tolerate no food. Constant pain and diarrhea.

## 2017-04-10 NOTE — ED Provider Notes (Signed)
[facility] DEPT Provider Note   CSN: [id number] Arrival date & time: 04/10/17  5277     History   Chief Complaint Chief Complaint  Patient presents with  . Abdominal Pain    HPI Erin Wolf is a 61 y.o. female.  HPI Patient reports she's been having symptoms of her ulcerative colitis flare for over 4 days. She reports she did have recurrent diarrhea but now because she is not eating much she just gets intermittent mucousy rectal discharge. She reports her rectal area is very raw. She states she doesn't think she could use a suppository. She reports she continues to have a lot of nausea and cramping abdominal pain. Cramping is predominantly in the lower abdomen is somewhat to the left. She reports she does have Phenergan and Zofran to take at home. She reports the Phenergan helped somewhat but she still is been feeling very badly for the past 2 days despite being treated emergency department 2 days ago. She reports she called her family doctor's office and they instructed her she was still feeling poorly to return to the emergency department. Past Medical History:  Diagnosis Date  . Anxiety   . Chest pain, exertional   . Chronic abdominal pain   . Chronic lower back pain   . Depression 01/03/2012  . Diverticulosis   . 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 Fibroids 01/03/2012  . GERD (gastroesophageal reflux disease)   . Hepatitis C    "signs of; not active" (05/28/2013)  . HTN (hypertension), benign 01/03/2012  . Hyperlipidemia 01/03/2012  . Hypertension   . IBS (irritable bowel syndrome)   . Migraines    "a few times/yr" (05/28/2013)  . Nausea and vomiting    chronic, recurrent  . Seizure ([place])    "today; < 1 yr ago, they just happen; started having them in my 20's" (05/28/2013)  . Seizure disorder ([place])   . SLE (systemic lupus erythematosus) ([place])   . Ulcerative colitis     Patient Active Problem List   Diagnosis Date Noted  . Nausea & vomiting 10/18/2016  . History of  ulcerative colitis   . LLQ abdominal pain 10/17/2016  . Syncope and collapse 06/29/2016  . Left-sided weakness 06/29/2016  . Facial twitching 06/29/2016  . IBS (irritable bowel syndrome) 06/29/2016  . Faintness   . Dehydration 05/09/2014  . GERD (gastroesophageal reflux disease) 05/09/2014  . Gastroenteritis 05/09/2014  . Chest pain, atypical 05/09/2014  . Chronic ulcerative colitis ([place]) 05/09/2014  . Acute gastroenteritis 05/09/2014  . Ulcerative colitis ([place]) 05/27/2013  . Hypokalemia 05/27/2013  . Anemia 01/04/2012  . Transaminitis 01/04/2012  . Diarrhea 01/04/2012  . Chest pain 01/03/2012  . SOB (shortness of breath) 01/03/2012  . HTN (hypertension), benign 01/03/2012  . Hx of migraines 01/03/2012  . Seizure ([place]) 01/03/2012  . Depression 01/03/2012  . Ulcerative colitis 01/03/2012  . Chronic back pain 01/03/2012  . 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 Hyperlipidemia 01/03/2012  . Hepatitis C 01/03/2012  . Fibroids 01/03/2012    Past Surgical History:  Procedure Laterality Date  . ABDOMINAL HYSTERECTOMY  1978   "partial" (05/28/2013)'fibroids"  . BILATERAL OOPHORECTOMY Bilateral 1987  . CHOLECYSTECTOMY  1978  . COLONOSCOPY N/A 05/31/2013   Procedure: COLONOSCOPY;  Surgeon: Wonda Horner, MD;  Location: [facility] ENDOSCOPY;  Service: Endoscopy;  Laterality: N/A;  . ESOPHAGOGASTRODUODENOSCOPY (EGD) WITH PROPOFOL N/A 05/11/2014   Procedure: ESOPHAGOGASTRODUODENOSCOPY (EGD) WITH PROPOFOL;  Surgeon: Lear Ng, MD;  Location: [facility] ENDOSCOPY;  Service: Endoscopy;  Laterality:  N/A;  egd first  . EUS N/A 06/21/2016   Procedure: ESOPHAGEAL ENDOSCOPIC ULTRASOUND (EUS) RADIAL;  Surgeon: Arta Silence, MD;  Location: [facility] ENDOSCOPY;  Service: Endoscopy;  Laterality: N/A;  . FLEXIBLE SIGMOIDOSCOPY N/A 05/11/2014   Procedure: FLEXIBLE SIGMOIDOSCOPY;  Surgeon: Lear Ng, MD;  Location: [facility] ENDOSCOPY;  Service: Endoscopy;  Laterality: N/A;  . TUBAL LIGATION      OB History    No  data available       Home Medications    Prior to Admission medications   Medication Sig Start Date End Date Taking? Authorizing Provider  cyclobenzaprine (FLEXERIL) 5 MG tablet Take 1 tablet (5 mg total) by mouth 3 (three) times daily as needed for muscle spasms. Patient not taking: Reported on 04/08/2017 10/01/14   Billy Fischer, MD  FLUoxetine (PROZAC) 20 MG capsule Take 20 mg by mouth every morning.    [provider]  Hyoscyamine Sulfate SL (LEVSIN/SL) 0.125 MG SUBL Place 0.125 mg under the tongue every 4 (four) hours as needed. 04/09/17   Charlann Lange, PA-C  Hyoscyamine Sulfate SL (LEVSIN/SL) 0.125 MG SUBL Place 0.125 mg under the tongue 4 (four) times daily as needed. 04/10/17   Charlesetta Shanks, MD  levETIRAcetam (KEPPRA XR) 500 MG 24 hr tablet Take 1-2 tablets (500-1,000 mg total) by mouth 2 (two) times daily. Take 1 tablet in the mornings and 2 tabs at bedtime. Patient taking differently: Take 500-1,000 mg by mouth See admin instructions. Take 1 tablet in the mornings and 2 tabs at bedtime. 07/01/16   Hongalgi, Lenis Dickinson, MD  mesalamine (CANASA) 1000 MG suppository Place 1 suppository (1,000 mg total) rectally at bedtime. 04/10/17   Charlesetta Shanks, MD  metoCLOPramide (REGLAN) 10 MG tablet Take 1 tablet (10 mg total) by mouth every 6 (six) hours. 04/10/17   Charlesetta Shanks, MD  ondansetron (ZOFRAN ODT) 4 MG disintegrating tablet Take 1 tablet (4 mg total) by mouth every 4 (four) hours as needed for nausea or vomiting. 04/10/17   Charlesetta Shanks, MD  ondansetron (ZOFRAN) 4 MG tablet Take 1 tablet (4 mg total) by mouth every 6 (six) hours. Patient not taking: Reported on 04/08/2017 10/19/16   Dhungel, Flonnie Overman, MD  promethazine (PHENERGAN) 25 MG tablet Take 1 tablet (25 mg total) by mouth every 6 (six) hours as needed for nausea or vomiting. 04/09/17   Charlann Lange, PA-C    Family History Family History  Problem Relation Age of Onset  . Colon cancer Brother     Social  History Social History  Substance Use Topics  . Smoking status: Never Smoker  . Smokeless tobacco: Never Used  . Alcohol use Yes     Comment: 05/28/2013 "glass of wine rarely; weddings, etc"     Allergies   Latex; Nsaids; Penicillins; Wellbutrin [bupropion hcl]; Aspirin; Darvocet [propoxyphene n-acetaminophen]; Isometheptene-dichloral-apap; Tape; Toradol [ketorolac tromethamine]; Tylenol [acetaminophen]; and Dilaudid [hydromorphone hcl]   Review of Systems Review of Systems 10 Systems reviewed and are negative for acute change except as noted in the HPI.   Physical Exam Updated Vital Signs BP 133/86   Pulse 67   Temp 98 F (36.7 C) (Oral)   Resp 17   Ht 5\' 4"  (1.626 m)   Wt 102.1 kg (225 lb)   SpO2 99%   BMI 38.62 kg/m   Physical Exam  Constitutional: She is oriented to person, place, and time. She appears well-developed and well-nourished. No distress.  The patient is generally well and appearance. She is alert and  nontoxic. Color is good. She has not appeared to be in distress. Moderate obesity.  HENT:  Head: Normocephalic and atraumatic.  Mouth/Throat: Oropharynx is clear and moist.  Eyes: Conjunctivae and EOM are normal.  Neck: Neck supple.  Cardiovascular: Normal rate and regular rhythm.   No murmur heard. Pulmonary/Chest: Effort normal and breath sounds normal. No respiratory distress.  Abdominal: Soft. She exhibits no distension. There is tenderness. There is no guarding.  Patient endorses tenderness diffusely over her lower abdomen. There is no guarding. The abdomen is very soft. No palpable mass.  Genitourinary:  Genitourinary Comments: Visual inspection of the external anal area is normal. No excoriations, swelling or fissures. No hemorrhoids. Digital examination, patient endorses tenderness but no palpable mass or swelling. Trace amount of yellow brown semi-formed stool in the vault. No visible blood or bloody mucus.  Musculoskeletal: Normal range of motion.  She exhibits no edema or tenderness.  Neurological: She is alert and oriented to person, place, and time. No cranial nerve deficit. She exhibits normal muscle tone. Coordination normal.  Skin: Skin is warm and dry.  Psychiatric: She has a normal mood and affect.  Nursing note and vitals reviewed.    ED Treatments / Results  Labs (all labs ordered are listed, but only abnormal results are displayed) Labs Reviewed  CBC - Abnormal; Notable for the following:       Result Value   MCH 25.8 (*)    RDW 15.8 (*)    All other components within normal limits  URINALYSIS, ROUTINE W REFLEX MICROSCOPIC - Abnormal; Notable for the following:    APPearance HAZY (*)    Ketones, ur 5 (*)    All other components within normal limits  LIPASE, BLOOD  COMPREHENSIVE METABOLIC PANEL  POC OCCULT BLOOD, ED    EKG  EKG Interpretation None       Radiology Dg Chest 2 View  Result Date: 04/08/2017 CLINICAL DATA:  Mid to left-sided chest pain began yesterday. Symptoms have worsened today inter associated with left shoulder and left upper extremity pain and tingling. Left foot numbness as well. EXAM: CHEST  2 VIEW COMPARISON:  Chest x-ray of June 29, 2016 and chest CT scan of June 30, 2016. FINDINGS: The lungs are adequately inflated and clear. The heart and pulmonary vascularity are normal. The mediastinum is normal in width. There is no pleural effusion. The bony thorax exhibits no acute abnormality. IMPRESSION: There is no pneumonia, CHF, nor other acute cardiopulmonary abnormality. Electronically Signed   By: David  Martinique M.D.   On: 04/08/2017 16:03    Procedures Procedures (including critical care time)  Medications Ordered in ED Medications  sodium chloride 0.9 % bolus 1,000 mL (1,000 mLs Intravenous New Bag/Given 04/10/17 0744)    Followed by  0.9 %  sodium chloride infusion (not administered)  metoCLOPramide (REGLAN) injection 10 mg (10 mg Intravenous Given 04/10/17 0745)   diphenhydrAMINE (BENADRYL) injection 25 mg (25 mg Intravenous Given 04/10/17 0744)  hyoscyamine (ANASPAZ) disintergrating tablet 0.125 mg (0.125 mg Oral Given 04/10/17 0746)     Initial Impression / Assessment and Plan / ED Course  I have reviewed the triage vital signs and the nursing notes.  Pertinent labs & imaging results that were available during my care of the patient were reviewed by me and considered in my medical decision making (see chart for details).      Final Clinical Impressions(s) / ED Diagnoses   Final diagnoses:  Lower abdominal pain  Non-intractable vomiting with nausea,  unspecified vomiting type  History of ulcerative colitis  Patient has had recurrent problems with abdominal pain, nausea vomiting and diarrhea. Patient reports this is consistent with a flare of her ulcerative colitis. Clinically patient is well in appearance. She is nontoxic. Abdominal examination is nonsurgical. Orthostatic vital signs and labs do not indicate dehydration. Rectal examination is negative for blood and to visual inspection shows no inflammation, fissures, hemorrhoids or general irritation. At this time, will have the patient and Reglan for her current nausea that she reports experiencing when she tries to eat or drink. Patient is to continue Levsin as needed for cramping, spasmodic pain. Will initiate Canasa for ulcerative colitis diagnosis. Patient is counseled to contact her gastroenterologist which is [facility] gastroenterology this week to get appointment as soon as possible.  New Prescriptions New Prescriptions   HYOSCYAMINE SULFATE SL (LEVSIN/SL) 0.125 MG SUBL    Place 0.125 mg under the tongue 4 (four) times daily as needed.   MESALAMINE (CANASA) 1000 MG SUPPOSITORY    Place 1 suppository (1,000 mg total) rectally at bedtime.   METOCLOPRAMIDE (REGLAN) 10 MG TABLET    Take 1 tablet (10 mg total) by mouth every 6 (six) hours.   ONDANSETRON (ZOFRAN ODT) 4 MG DISINTEGRATING TABLET    Take  1 tablet (4 mg total) by mouth every 4 (four) hours as needed for nausea or vomiting.     Charlesetta Shanks, MD 04/10/17 [phone]

## 2017-04-22 DIAGNOSIS — K5190 Ulcerative colitis, unspecified, without complications: Secondary | ICD-10-CM | POA: Diagnosis not present

## 2017-04-22 DIAGNOSIS — F419 Anxiety disorder, unspecified: Secondary | ICD-10-CM | POA: Diagnosis not present

## 2017-04-22 DIAGNOSIS — F324 Major depressive disorder, single episode, in partial remission: Secondary | ICD-10-CM | POA: Diagnosis not present

## 2017-04-22 DIAGNOSIS — G40909 Epilepsy, unspecified, not intractable, without status epilepticus: Secondary | ICD-10-CM | POA: Diagnosis not present

## 2017-04-22 DIAGNOSIS — I10 Essential (primary) hypertension: Secondary | ICD-10-CM | POA: Diagnosis not present

## 2017-04-22 DIAGNOSIS — R7303 Prediabetes: Secondary | ICD-10-CM | POA: Diagnosis not present

## 2017-04-22 DIAGNOSIS — E7800 Pure hypercholesterolemia, unspecified: Secondary | ICD-10-CM | POA: Diagnosis not present

## 2017-04-22 DIAGNOSIS — G894 Chronic pain syndrome: Secondary | ICD-10-CM | POA: Diagnosis not present

## 2017-06-06 ENCOUNTER — Emergency Department (HOSPITAL_COMMUNITY)
Admission: EM | Admit: 2017-06-06 | Discharge: 2017-06-06 | Disposition: A | Discharge status: Home / Self Care | Payer: Medicare Other | Attending: Emergency Medicine | Admitting: Emergency Medicine

## 2017-06-06 ENCOUNTER — Emergency Department (HOSPITAL_COMMUNITY): Payer: Medicare Other

## 2017-06-06 ENCOUNTER — Encounter (HOSPITAL_COMMUNITY): Payer: Self-pay | Admitting: Emergency Medicine

## 2017-06-06 DIAGNOSIS — R109 Unspecified abdominal pain: Secondary | ICD-10-CM

## 2017-06-06 DIAGNOSIS — R350 Frequency of micturition: Secondary | ICD-10-CM | POA: Diagnosis not present

## 2017-06-06 DIAGNOSIS — R1031 Right lower quadrant pain: Secondary | ICD-10-CM | POA: Insufficient documentation

## 2017-06-06 DIAGNOSIS — R509 Fever, unspecified: Secondary | ICD-10-CM | POA: Diagnosis present

## 2017-06-06 DIAGNOSIS — R1110 Vomiting, unspecified: Secondary | ICD-10-CM | POA: Insufficient documentation

## 2017-06-06 DIAGNOSIS — I10 Essential (primary) hypertension: Secondary | ICD-10-CM | POA: Diagnosis not present

## 2017-06-06 DIAGNOSIS — M3210 Systemic lupus erythematosus, organ or system involvement unspecified: Secondary | ICD-10-CM | POA: Insufficient documentation

## 2017-06-06 DIAGNOSIS — Z79899 Other long term (current) drug therapy: Secondary | ICD-10-CM | POA: Diagnosis not present

## 2017-06-06 DIAGNOSIS — K5790 Diverticulosis of intestine, part unspecified, without perforation or abscess without bleeding: Secondary | ICD-10-CM | POA: Diagnosis not present

## 2017-06-06 DIAGNOSIS — R197 Diarrhea, unspecified: Secondary | ICD-10-CM | POA: Diagnosis not present

## 2017-06-06 DIAGNOSIS — R319 Hematuria, unspecified: Secondary | ICD-10-CM | POA: Insufficient documentation

## 2017-06-06 DIAGNOSIS — R300 Dysuria: Secondary | ICD-10-CM | POA: Diagnosis not present

## 2017-06-06 LAB — CBC WITH DIFFERENTIAL/PLATELET
Basophils Absolute: 0 10*3/uL (ref 0.0–0.1)
Basophils Relative: 0 %
EOS PCT: 0 %
Eosinophils Absolute: 0 10*3/uL (ref 0.0–0.7)
HEMATOCRIT: 41.9 % (ref 36.0–46.0)
Hemoglobin: 14.1 g/dL (ref 12.0–15.0)
LYMPHS ABS: 2.7 10*3/uL (ref 0.7–4.0)
Lymphocytes Relative: 54 %
MCH: 26.5 pg (ref 26.0–34.0)
MCHC: 33.7 g/dL (ref 30.0–36.0)
MCV: 78.6 fL (ref 78.0–100.0)
MONO ABS: 0.4 10*3/uL (ref 0.1–1.0)
MONOS PCT: 9 %
Neutro Abs: 1.9 10*3/uL (ref 1.7–7.7)
Neutrophils Relative %: 37 %
PLATELETS: 339 10*3/uL (ref 150–400)
RBC: 5.33 MIL/uL — ABNORMAL HIGH (ref 3.87–5.11)
RDW: 15.8 % — AB (ref 11.5–15.5)
WBC: 5.1 10*3/uL (ref 4.0–10.5)

## 2017-06-06 LAB — URINALYSIS, ROUTINE W REFLEX MICROSCOPIC
BILIRUBIN URINE: NEGATIVE
Glucose, UA: NEGATIVE mg/dL
Hgb urine dipstick: NEGATIVE
Ketones, ur: 5 mg/dL — AB
Leukocytes, UA: NEGATIVE
Nitrite: NEGATIVE
PROTEIN: 30 mg/dL — AB
SPECIFIC GRAVITY, URINE: 1.023 (ref 1.005–1.030)
pH: 5 (ref 5.0–8.0)

## 2017-06-06 LAB — COMPREHENSIVE METABOLIC PANEL
ALK PHOS: 98 U/L (ref 38–126)
ALT: 37 U/L (ref 14–54)
AST: 21 U/L (ref 15–41)
Albumin: 3.9 g/dL (ref 3.5–5.0)
Anion gap: 12 (ref 5–15)
BILIRUBIN TOTAL: 0.4 mg/dL (ref 0.3–1.2)
BUN: 11 mg/dL (ref 6–20)
CHLORIDE: 106 mmol/L (ref 101–111)
CO2: 21 mmol/L — AB (ref 22–32)
Calcium: 9.4 mg/dL (ref 8.9–10.3)
Creatinine, Ser: 0.77 mg/dL (ref 0.44–1.00)
GFR calc Af Amer: >60 mL/min (ref >60)
GFR calc non Af Amer: >60 mL/min (ref >60)
GLUCOSE: 141 mg/dL — AB (ref 65–99)
POTASSIUM: 4.1 mmol/L (ref 3.5–5.1)
Sodium: 139 mmol/L (ref 135–145)
TOTAL PROTEIN: 8.5 g/dL — AB (ref 6.5–8.1)

## 2017-06-06 LAB — I-STAT CG4 LACTIC ACID, ED
LACTIC ACID, VENOUS: 1.3 mmol/L (ref 0.5–1.9)
LACTIC ACID, VENOUS: 1.91 mmol/L — AB (ref 0.5–1.9)

## 2017-06-06 MED ORDER — ONDANSETRON 8 MG PO TBDP: 8.0000 mg | ORAL_TABLET | Freq: Three times a day (TID) | ORAL | 0 refills | Status: DC | PRN

## 2017-06-06 MED ORDER — SODIUM CHLORIDE 0.9 % IV BOLUS (SEPSIS)
1000.0000 mL | Freq: Once | INTRAVENOUS | Status: AC
  Administered 2017-06-06: 1000 mL via INTRAVENOUS

## 2017-06-06 MED ORDER — FENTANYL CITRATE (PF) 100 MCG/2ML IJ SOLN
100.0000 ug | Freq: Once | INTRAMUSCULAR | Status: AC
  Administered 2017-06-06: 100 ug via INTRAVENOUS
  Filled 2017-06-06: qty 2

## 2017-06-06 MED ORDER — MORPHINE SULFATE (PF) 2 MG/ML IV SOLN
4.0000 mg | Freq: Once | INTRAVENOUS | Status: AC
  Administered 2017-06-06: 4 mg via INTRAVENOUS
  Filled 2017-06-06: qty 2

## 2017-06-06 MED ORDER — ONDANSETRON HCL 4 MG/2ML IJ SOLN
4.0000 mg | Freq: Once | INTRAMUSCULAR | Status: AC
  Administered 2017-06-06: 4 mg via INTRAVENOUS
  Filled 2017-06-06: qty 2

## 2017-06-06 NOTE — ED Triage Notes (Signed)
Pt states that on Monday, she started having fever, frequency of urination, dysuria, hematuria, rt sided flank/ abd pain, diarrhea, and vomiting.  Pt states she feels weak.

## 2017-06-06 NOTE — ED Notes (Signed)
Pt reports that the morphine has not touched her pain. She states she was on Oxycodone 3X per day at home and that had not been working.  Pt reports that if Morphine doesn't work, she has received fentanyl in the past, as dilaudid gives her hives and makes her sick.

## 2017-06-06 NOTE — ED Provider Notes (Signed)
[facility] DEPT Provider Note   CSN: [id number] Arrival date & time: 06/06/17  1023     History   Chief Complaint Chief Complaint  Patient presents with  . Fever  . Flank Pain  . Hematuria    HPI Erin Wolf is a 61 y.o. female.  HPI Patient presents to the emergency department with complaints of fever at home with frequent urination and hematuria.  She also reports right-sided flank pain and diarrhea and vomiting.  She reports abdominal cramping.  She states she feels weak and dehydrated.  Her symptoms are moderate to severe in severity.   Past Medical History:  Diagnosis Date  . Anxiety   . Chest pain, exertional   . Chronic abdominal pain   . Chronic lower back pain   . Depression 01/03/2012  . Diverticulosis   . 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 Fibroids 01/03/2012  . GERD (gastroesophageal reflux disease)   . Hepatitis C    "signs of; not active" (05/28/2013)  . HTN (hypertension), benign 01/03/2012  . Hyperlipidemia 01/03/2012  . Hypertension   . IBS (irritable bowel syndrome)   . Migraines    "a few times/yr" (05/28/2013)  . Nausea and vomiting    chronic, recurrent  . Seizure ([place])    "today; < 1 yr ago, they just happen; started having them in my 20's" (05/28/2013)  . Seizure disorder ([place])   . SLE (systemic lupus erythematosus) ([place])   . Ulcerative colitis     Patient Active Problem List   Diagnosis Date Noted  . Nausea & vomiting 10/18/2016  . History of ulcerative colitis   . LLQ abdominal pain 10/17/2016  . Syncope and collapse 06/29/2016  . Left-sided weakness 06/29/2016  . Facial twitching 06/29/2016  . IBS (irritable bowel syndrome) 06/29/2016  . Faintness   . Dehydration 05/09/2014  . GERD (gastroesophageal reflux disease) 05/09/2014  . Gastroenteritis 05/09/2014  . Chest pain, atypical 05/09/2014  . Chronic ulcerative colitis ([place]) 05/09/2014  . Acute gastroenteritis 05/09/2014  . Ulcerative colitis ([place]) 05/27/2013  . Hypokalemia  05/27/2013  . Anemia 01/04/2012  . Transaminitis 01/04/2012  . Diarrhea 01/04/2012  . Chest pain 01/03/2012  . SOB (shortness of breath) 01/03/2012  . HTN (hypertension), benign 01/03/2012  . Hx of migraines 01/03/2012  . Seizure ([place]) 01/03/2012  . Depression 01/03/2012  . Ulcerative colitis 01/03/2012  . Chronic back pain 01/03/2012  . 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 Hyperlipidemia 01/03/2012  . Hepatitis C 01/03/2012  . Fibroids 01/03/2012    Past Surgical History:  Procedure Laterality Date  . ABDOMINAL HYSTERECTOMY  1978   "partial" (05/28/2013)'fibroids"  . BILATERAL OOPHORECTOMY Bilateral 1987  . CHOLECYSTECTOMY  1978  . COLONOSCOPY N/A 05/31/2013   Procedure: COLONOSCOPY;  Surgeon: Wonda Horner, MD;  Location[facility] ENDOSCOPY;  Service: Endoscopy;  Laterality: N/A;  . ESOPHAGOGASTRODUODENOSCOPY (EGD) WITH PROPOFOL N/A 05/11/2014   Procedure: ESOPHAGOGASTRODUODENOSCOPY (EGD) WITH PROPOFOL;  Surgeon: Lear Ng, MD;  Location: [facility] ENDOSCOPY;  Service: Endoscopy;  Laterality: N/A;  egd first  . EUS N/A 06/21/2016   Procedure: ESOPHAGEAL ENDOSCOPIC ULTRASOUND (EUS) RADIAL;  Surgeon: Arta Silence, MD;  Location: [facility] ENDOSCOPY;  Service: Endoscopy;  Laterality: N/A;  . FLEXIBLE SIGMOIDOSCOPY N/A 05/11/2014   Procedure: FLEXIBLE SIGMOIDOSCOPY;  Surgeon: Lear Ng, MD;  Location: [facility] ENDOSCOPY;  Service: Endoscopy;  Laterality: N/A;  . TUBAL LIGATION      OB History    No data available       Home Medications  Prior to Admission medications   Medication Sig Start Date End Date Taking? Authorizing Provider  Acetaminophen-Guaifenesin (THERAFLU FLU/CHEST CONGESTION) 1000-400 MG PACK Take 1 Package by mouth daily as needed (flu like symptoms).   Yes [provider]  diazepam (VALIUM) 5 MG tablet Take 5 mg by mouth 2 (two) times daily as needed for anxiety. 05/07/17  Yes [provider]  FLUoxetine (PROZAC) 20 MG capsule Take 20 mg by mouth every  morning.   Yes [provider]  levETIRAcetam (KEPPRA XR) 500 MG 24 hr tablet Take 1-2 tablets (500-1,000 mg total) by mouth 2 (two) times daily. Take 1 tablet in the mornings and 2 tabs at bedtime. Patient taking differently: Take 500-1,000 mg by mouth See admin instructions. Take 500 mg in the mornings and1000 mg at bedtime. 07/01/16  Yes Hongalgi, Lenis Dickinson, MD  metoCLOPramide (REGLAN) 10 MG tablet Take 1 tablet (10 mg total) by mouth every 6 (six) hours. Patient taking differently: Take 10 mg by mouth every 6 (six) hours as needed for nausea or vomiting.  04/10/17  Yes Charlesetta Shanks, MD  ondansetron (ZOFRAN ODT) 4 MG disintegrating tablet Take 1 tablet (4 mg total) by mouth every 4 (four) hours as needed for nausea or vomiting. 04/10/17  Yes Charlesetta Shanks, MD  Oxycodone HCl 10 MG TABS Take 10 mg by mouth 3 (three) times daily as needed for pain. 05/21/17  Yes [provider]  promethazine (PHENERGAN) 25 MG tablet Take 1 tablet (25 mg total) by mouth every 6 (six) hours as needed for nausea or vomiting. 04/09/17  Yes Upstill, Shari, PA-C  cyclobenzaprine (FLEXERIL) 5 MG tablet Take 1 tablet (5 mg total) by mouth 3 (three) times daily as needed for muscle spasms. Patient not taking: Reported on 04/08/2017 10/01/14   Billy Fischer, MD  Hyoscyamine Sulfate SL (LEVSIN/SL) 0.125 MG SUBL Place 0.125 mg under the tongue every 4 (four) hours as needed. Patient not taking: Reported on 06/06/2017 04/09/17   Charlann Lange, PA-C  Hyoscyamine Sulfate SL (LEVSIN/SL) 0.125 MG SUBL Place 0.125 mg under the tongue 4 (four) times daily as needed. Patient not taking: Reported on 06/06/2017 04/10/17   Charlesetta Shanks, MD  mesalamine (CANASA) 1000 MG suppository Place 1 suppository (1,000 mg total) rectally at bedtime. Patient not taking: Reported on 06/06/2017 04/10/17   Charlesetta Shanks, MD  ondansetron (ZOFRAN) 4 MG tablet Take 1 tablet (4 mg total) by mouth every 6 (six) hours. Patient not taking:  Reported on 04/08/2017 10/19/16   Louellen Molder, MD    Family History Family History  Problem Relation Age of Onset  . Colon cancer Brother     Social History Social History  Substance Use Topics  . Smoking status: Never Smoker  . Smokeless tobacco: Never Used  . Alcohol use Yes     Comment: 05/28/2013 "glass of wine rarely; weddings, etc"     Allergies   Latex; Nsaids; Penicillins; Wellbutrin [bupropion hcl]; Aspirin; Darvocet [propoxyphene n-acetaminophen]; Isometheptene-dichloral-apap; Tape; Toradol [ketorolac tromethamine]; Tylenol [acetaminophen]; and Dilaudid [hydromorphone hcl]   Review of Systems Review of Systems  All other systems reviewed and are negative.    Physical Exam Updated Vital Signs BP (!) 141/99   Pulse (!) 105   Temp 97.6 F (36.4 C) (Oral)   Resp 18   Ht 5\' 4"  (1.626 m)   Wt 102.1 kg (225 lb)   SpO2 99%   BMI 38.62 kg/m   Physical Exam  Constitutional: She is oriented to person, place, and  time. She appears well-developed and well-nourished. No distress.  HENT:  Head: Normocephalic and atraumatic.  Eyes: EOM are normal.  Neck: Normal range of motion.  Cardiovascular: Normal rate, regular rhythm and normal heart sounds.   Pulmonary/Chest: Effort normal and breath sounds normal.  Abdominal: Soft. She exhibits no distension. There is no tenderness.  Musculoskeletal: Normal range of motion.  Neurological: She is alert and oriented to person, place, and time.  Skin: Skin is warm and dry.  Psychiatric: She has a normal mood and affect. Judgment normal.  Nursing note and vitals reviewed.    ED Treatments / Results  Labs (all labs ordered are listed, but only abnormal results are displayed) Labs Reviewed  COMPREHENSIVE METABOLIC PANEL - Abnormal; Notable for the following:       Result Value   CO2 21 (*)    Glucose, Bld 141 (*)    Total Protein 8.5 (*)    All other components within normal limits  CBC WITH DIFFERENTIAL/PLATELET -  Abnormal; Notable for the following:    RBC 5.33 (*)    RDW 15.8 (*)    All other components within normal limits  URINALYSIS, ROUTINE W REFLEX MICROSCOPIC - Abnormal; Notable for the following:    Color, Urine AMBER (*)    APPearance CLOUDY (*)    Ketones, ur 5 (*)    Protein, ur 30 (*)    Bacteria, UA FEW (*)    Squamous Epithelial / LPF 0-5 (*)    All other components within normal limits  I-STAT CG4 LACTIC ACID, ED - Abnormal; Notable for the following:    Lactic Acid, Venous 1.91 (*)    All other components within normal limits  I-STAT CG4 LACTIC ACID, ED    EKG  EKG Interpretation None       Radiology Ct Renal Stone Study  Result Date: 06/06/2017 CLINICAL DATA:  Fever, dysuria, right-sided flank pain. EXAM: CT ABDOMEN AND PELVIS WITHOUT CONTRAST TECHNIQUE: Multidetector CT imaging of the abdomen and pelvis was performed following the standard protocol without IV contrast. COMPARISON:  CT scan of October 17, 2016. FINDINGS: Lower chest: No acute abnormality. Hepatobiliary: No focal liver abnormality is seen. Status post cholecystectomy. No biliary dilatation. Pancreas: Stable pancreatic ductal dilatation is noted. No definite inflammatory changes are noted. Spleen: Normal in size without focal abnormality. Adrenals/Urinary Tract: Stable left adrenal myelolipoma. Right adrenal gland appears normal. No renal or ureteral calculi are noted. No hydronephrosis or renal obstruction is noted. Urinary bladder is unremarkable. Stomach/Bowel: There is no evidence of bowel obstruction or inflammation. The stomach appears normal. The appendix is not visualized. Diverticulosis of descending colon is noted without inflammation. Vascular/Lymphatic: No significant vascular findings are present. No enlarged abdominal or pelvic lymph nodes. Reproductive: Status post hysterectomy. No adnexal masses. Other: No abdominal wall hernia or abnormality. No abdominopelvic ascites. Musculoskeletal: No acute or  significant osseous findings. IMPRESSION: Diverticulosis of descending colon without inflammation. Stable pancreatic ductal dilatation. No acute abnormality seen in the abdomen or pelvis. Electronically Signed   By: Marijo Conception, M.D.   On: 06/06/2017 15:53    Procedures Procedures (including critical care time)  Medications Ordered in ED Medications  morphine 2 MG/ML injection 4 mg (4 mg Intravenous Given 06/06/17 1339)  ondansetron (ZOFRAN) injection 4 mg (4 mg Intravenous Given 06/06/17 1346)  sodium chloride 0.9 % bolus 1,000 mL (0 mLs Intravenous Stopped 06/06/17 1633)  fentaNYL (SUBLIMAZE) injection 100 mcg (100 mcg Intravenous Given 06/06/17 1437)  Initial Impression / Assessment and Plan / ED Course  I have reviewed the triage vital signs and the nursing notes.  Pertinent labs & imaging results that were available during my care of the patient were reviewed by me and considered in my medical decision making (see chart for details).     Patient feels much better this time.  Urine without signs of infection.  CT scan images of significant abnormality.  Discharge home in good condition.  Primary care follow-up.  She understands return to the ER for new or worsening symptoms  Final Clinical Impressions(s) / ED Diagnoses   Final diagnoses:  Flank pain    New Prescriptions New Prescriptions   No medications on file     Jola Schmidt, MD 06/06/17 [phone]

## 2017-06-17 DIAGNOSIS — G894 Chronic pain syndrome: Secondary | ICD-10-CM | POA: Diagnosis not present

## 2017-06-17 DIAGNOSIS — R310 Gross hematuria: Secondary | ICD-10-CM | POA: Diagnosis not present

## 2017-06-17 DIAGNOSIS — R600 Localized edema: Secondary | ICD-10-CM | POA: Diagnosis not present

## 2017-06-17 DIAGNOSIS — M545 Low back pain: Secondary | ICD-10-CM | POA: Diagnosis not present

## 2017-06-17 DIAGNOSIS — L0201 Cutaneous abscess of face: Secondary | ICD-10-CM | POA: Diagnosis not present

## 2017-07-10 DIAGNOSIS — R3121 Asymptomatic microscopic hematuria: Secondary | ICD-10-CM | POA: Diagnosis not present

## 2017-07-10 DIAGNOSIS — N3000 Acute cystitis without hematuria: Secondary | ICD-10-CM | POA: Diagnosis not present

## 2017-07-10 DIAGNOSIS — R300 Dysuria: Secondary | ICD-10-CM | POA: Diagnosis not present

## 2017-07-10 DIAGNOSIS — M545 Low back pain: Secondary | ICD-10-CM | POA: Diagnosis not present

## 2017-07-18 ENCOUNTER — Other Ambulatory Visit: Payer: Self-pay | Admitting: Urology

## 2017-07-21 ENCOUNTER — Encounter (HOSPITAL_BASED_OUTPATIENT_CLINIC_OR_DEPARTMENT_OTHER): Payer: Self-pay | Admitting: *Deleted

## 2017-07-21 NOTE — Progress Notes (Signed)
NPO AFTER MN.  ARRIVE AT 0630.  NEEDS ISTAT 8.  CURRENT EKG IN CHART AND EPIC.  WILL TAKE KEPPRA AND PROZAC AM DOS W/ SIPS OF WATER.

## 2017-07-21 NOTE — H&P (Signed)
Urology Preoperative H&P   Chief Complaint: Gross hematuria  History of Present Illness: Erin Wolf is a 61 y.o. female with intermittent episodes of gross hematuria associated with flank pain and subjective fevers.  She was recently evaluated at the [facility] emergency department on 06/06/2017 in all lab work at that time including a urinalysis, BMP, CBC and lactic acid were all within the normal range.  She had a CT stone study at that time that was unremarkable from a urologic standpoint.  During her evaluation in the office, the patient refused to have a flexible cystoscopy performed and requested general anesthesia for further cystoscopic evaluation of her hematuria.    Past Medical History:  Diagnosis Date  . Anxiety   . Chest pain, exertional   . Chronic abdominal pain   . Chronic lower back pain   . Depression 01/03/2012  . Diverticulosis   . 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 Fibroids 01/03/2012  . GERD (gastroesophageal reflux disease)   . Hepatitis C    "signs of; not active" (05/28/2013)  . HTN (hypertension), benign 01/03/2012  . Hyperlipidemia 01/03/2012  . Hypertension   . IBS (irritable bowel syndrome)   . Migraines    "a few times/yr" (05/28/2013)  . Nausea and vomiting    chronic, recurrent  . Seizure ([place])    "today; < 1 yr ago, they just happen; started having them in my 20's" (05/28/2013)  . Seizure disorder ([place])   . SLE (systemic lupus erythematosus) ([place])   . Ulcerative colitis     Past Surgical History:  Procedure Laterality Date  . ABDOMINAL HYSTERECTOMY  1978   "partial" (05/28/2013)'fibroids"  . BILATERAL OOPHORECTOMY Bilateral 1987  . CHOLECYSTECTOMY  1978  . COLONOSCOPY N/A 05/31/2013   Procedure: COLONOSCOPY;  Surgeon: Wonda Horner, MD;  Location: [facility] ENDOSCOPY;  Service: Endoscopy;  Laterality: N/A;  . ESOPHAGOGASTRODUODENOSCOPY (EGD) WITH PROPOFOL N/A 05/11/2014   Procedure: ESOPHAGOGASTRODUODENOSCOPY (EGD) WITH PROPOFOL;  Surgeon: Lear Ng, MD;  Location: [facility] ENDOSCOPY;  Service: Endoscopy;  Laterality: N/A;  egd first  . EUS N/A 06/21/2016   Procedure: ESOPHAGEAL ENDOSCOPIC ULTRASOUND (EUS) RADIAL;  Surgeon: Arta Silence, MD;  Location: [facility] ENDOSCOPY;  Service: Endoscopy;  Laterality: N/A;  . FLEXIBLE SIGMOIDOSCOPY N/A 05/11/2014   Procedure: FLEXIBLE SIGMOIDOSCOPY;  Surgeon: Lear Ng, MD;  Location: [facility] ENDOSCOPY;  Service: Endoscopy;  Laterality: N/A;  . TUBAL LIGATION      Allergies:  Allergies  Allergen Reactions  . Latex Other (See Comments)    Peels skin  . Nsaids Other (See Comments)    HAS COLITIS FLARES  . Penicillins Hives    Has patient had a PCN reaction causing immediate rash, facial/tongue/throat swelling, SOB or lightheadedness with hypotension: Yes Has patient had a PCN reaction causing severe rash involving mucus membranes or skin necrosis: Unknown Has patient had a PCN reaction that required hospitalization: Unknown Has patient had a PCN reaction occurring within the last 10 years: No If all of the above answers are "NO", then may proceed with Cephalosporin use.   . Wellbutrin [Bupropion Hcl] Other (See Comments)    Insomnia and headaches  . Aspirin Nausea Only  . Darvocet [Propoxyphene N-Acetaminophen] Nausea And Vomiting  . Isometheptene-Dichloral-Apap Nausea Only    IV dye?  Marland Kitchen Tape Hives  . Toradol [Ketorolac Tromethamine] Nausea And Vomiting  . Tylenol [Acetaminophen] Nausea Only  . Dilaudid [Hydromorphone Hcl] Itching, Nausea Only and Rash    Family History  Problem Relation Age of  Onset  . Colon cancer Brother     Social History:  reports that she has never smoked. She has never used smokeless tobacco. She reports that she drinks alcohol. She reports that she does not use drugs.  ROS: A complete review of systems was performed.  All systems are negative except for pertinent findings as noted.  Physical Exam:  Vital signs in last 24 hours: BP: ()/()  Arterial Line  BP: ()/()  Constitutional:  Alert and oriented, No acute distress Cardiovascular: Regular rate and rhythm, No JVD Respiratory: Normal respiratory effort, Lungs clear bilaterally GI: Abdomen is soft, nontender, nondistended, no abdominal masses GU: No CVA tenderness Lymphatic: No lymphadenopathy Neurologic: Grossly intact, no focal deficits Psychiatric: Normal mood and affect  Laboratory Data:  No results for input(s): WBC, HGB, HCT, PLT in the last 72 hours.  No results for input(s): NA, K, CL, GLUCOSE, BUN, CALCIUM, CREATININE in the last 72 hours.  Invalid input(s): CO3   No results found for this or any previous visit (from the past 24 hour(s)). No results found for this or any previous visit (from the past 240 hour(s)).  Renal Function: No results for input(s): CREATININE in the last 168 hours. CrCl cannot be calculated (Patient's most recent lab result is older than the maximum 21 days allowed.).  Radiologic Imaging: No results found.  I independently reviewed the above imaging studies.  Assessment and Plan Erin Wolf is a 61 y.o. female with intermittent episodes of gross hematuria and ongoing flank/back pain  -The risks, benefits and alternatives of cystoscopy with bilateral retrograde pyelograms, possible ureteroscopy and exam under anesthesia was discussed with the patient.  Risks include bleeding, infection and no obvious urologic findings that could explain her ongoing hematuria and flank/back pain.  She voices understanding and wishes to proceed.    Ellison Hughs, MD 07/21/2017, 12:02 PM  [facility] Pager: [phone]

## 2017-07-23 ENCOUNTER — Encounter (HOSPITAL_BASED_OUTPATIENT_CLINIC_OR_DEPARTMENT_OTHER)
Admission: RE | Disposition: A | Discharge status: Home / Self Care | Payer: Self-pay | Source: Ambulatory Visit | Attending: Urology

## 2017-07-23 ENCOUNTER — Ambulatory Visit (HOSPITAL_BASED_OUTPATIENT_CLINIC_OR_DEPARTMENT_OTHER): Payer: Medicare Other | Admitting: Anesthesiology

## 2017-07-23 ENCOUNTER — Ambulatory Visit (HOSPITAL_BASED_OUTPATIENT_CLINIC_OR_DEPARTMENT_OTHER)
Admission: RE | Admit: 2017-07-23 | Discharge: 2017-07-23 | Disposition: A | Discharge status: Home / Self Care | Payer: Medicare Other | Source: Ambulatory Visit | Attending: Urology | Admitting: Urology

## 2017-07-23 ENCOUNTER — Encounter (HOSPITAL_BASED_OUTPATIENT_CLINIC_OR_DEPARTMENT_OTHER): Payer: Self-pay | Admitting: *Deleted

## 2017-07-23 DIAGNOSIS — M329 Systemic lupus erythematosus, unspecified: Secondary | ICD-10-CM | POA: Diagnosis not present

## 2017-07-23 DIAGNOSIS — I10 Essential (primary) hypertension: Secondary | ICD-10-CM | POA: Diagnosis not present

## 2017-07-23 DIAGNOSIS — Z9049 Acquired absence of other specified parts of digestive tract: Secondary | ICD-10-CM | POA: Insufficient documentation

## 2017-07-23 DIAGNOSIS — F341 Dysthymic disorder: Secondary | ICD-10-CM | POA: Insufficient documentation

## 2017-07-23 DIAGNOSIS — M549 Dorsalgia, unspecified: Secondary | ICD-10-CM | POA: Diagnosis not present

## 2017-07-23 DIAGNOSIS — R1084 Generalized abdominal pain: Secondary | ICD-10-CM | POA: Diagnosis not present

## 2017-07-23 DIAGNOSIS — M797 Fibromyalgia: Secondary | ICD-10-CM | POA: Insufficient documentation

## 2017-07-23 DIAGNOSIS — Z79891 Long term (current) use of opiate analgesic: Secondary | ICD-10-CM | POA: Diagnosis not present

## 2017-07-23 DIAGNOSIS — R319 Hematuria, unspecified: Secondary | ICD-10-CM | POA: Diagnosis not present

## 2017-07-23 DIAGNOSIS — F419 Anxiety disorder, unspecified: Secondary | ICD-10-CM | POA: Insufficient documentation

## 2017-07-23 DIAGNOSIS — R310 Gross hematuria: Secondary | ICD-10-CM | POA: Insufficient documentation

## 2017-07-23 DIAGNOSIS — Z90710 Acquired absence of both cervix and uterus: Secondary | ICD-10-CM | POA: Insufficient documentation

## 2017-07-23 DIAGNOSIS — R109 Unspecified abdominal pain: Secondary | ICD-10-CM | POA: Diagnosis not present

## 2017-07-23 DIAGNOSIS — Z79899 Other long term (current) drug therapy: Secondary | ICD-10-CM | POA: Insufficient documentation

## 2017-07-23 DIAGNOSIS — K219 Gastro-esophageal reflux disease without esophagitis: Secondary | ICD-10-CM | POA: Diagnosis not present

## 2017-07-23 DIAGNOSIS — F329 Major depressive disorder, single episode, unspecified: Secondary | ICD-10-CM | POA: Insufficient documentation

## 2017-07-23 DIAGNOSIS — G40909 Epilepsy, unspecified, not intractable, without status epilepticus: Secondary | ICD-10-CM | POA: Diagnosis not present

## 2017-07-23 HISTORY — DX: Frequency of micturition: R35.0

## 2017-07-23 HISTORY — PX: CYSTOSCOPY/RETROGRADE/URETEROSCOPY: SHX5316

## 2017-07-23 HISTORY — DX: Fibromyalgia: M79.7

## 2017-07-23 HISTORY — DX: Personal history of other benign neoplasm: Z86.018

## 2017-07-23 HISTORY — DX: Nausea: R11.0

## 2017-07-23 HISTORY — DX: Unspecified osteoarthritis, unspecified site: M19.90

## 2017-07-23 HISTORY — DX: Personal history of other specified conditions: Z87.898

## 2017-07-23 HISTORY — DX: Presence of spectacles and contact lenses: Z97.3

## 2017-07-23 HISTORY — DX: Other specified abnormal immunological findings in serum: R76.89

## 2017-07-23 HISTORY — DX: Myalgia, unspecified site: M79.10

## 2017-07-23 HISTORY — DX: Other specified abnormal immunological findings in serum: R76.8

## 2017-07-23 HISTORY — DX: Hematuria, unspecified: R31.9

## 2017-07-23 HISTORY — DX: Personal history of other mental and behavioral disorders: Z86.59

## 2017-07-23 LAB — POCT I-STAT, CHEM 8
BUN: 14 mg/dL (ref 6–20)
CALCIUM ION: 1.2 mmol/L (ref 1.15–1.40)
CHLORIDE: 103 mmol/L (ref 101–111)
CREATININE: 0.6 mg/dL (ref 0.44–1.00)
GLUCOSE: 113 mg/dL — AB (ref 65–99)
HCT: 40 % (ref 36.0–46.0)
Hemoglobin: 13.6 g/dL (ref 12.0–15.0)
Potassium: 3.8 mmol/L (ref 3.5–5.1)
Sodium: 142 mmol/L (ref 135–145)
TCO2: 27 mmol/L (ref 22–32)

## 2017-07-23 SURGERY — CYSTOSCOPY/RETROGRADE/URETEROSCOPY
Anesthesia: General | Site: Bladder | Laterality: Bilateral

## 2017-07-23 MED ORDER — OXYCODONE HCL 5 MG PO TABS
5.0000 mg | ORAL_TABLET | Freq: Once | ORAL | Status: AC
  Administered 2017-07-23: 5 mg via ORAL
  Filled 2017-07-23: qty 1

## 2017-07-23 MED ORDER — SCOPOLAMINE 1 MG/3DAYS TD PT72
1.0000 | MEDICATED_PATCH | Freq: Once | TRANSDERMAL | Status: AC
  Administered 2017-07-23: 1 via TRANSDERMAL
  Administered 2017-07-23: 1.5 mg via TRANSDERMAL
  Filled 2017-07-23: qty 1

## 2017-07-23 MED ORDER — CIPROFLOXACIN IN D5W 400 MG/200ML IV SOLN
400.0000 mg | Freq: Once | INTRAVENOUS | Status: AC
  Administered 2017-07-23: 400 mg via INTRAVENOUS
  Filled 2017-07-23: qty 200

## 2017-07-23 MED ORDER — PHENAZOPYRIDINE HCL 200 MG PO TABS: 200.0000 mg | ORAL_TABLET | Freq: Three times a day (TID) | ORAL | 0 refills | Status: DC | PRN

## 2017-07-23 MED ORDER — FENTANYL CITRATE (PF) 100 MCG/2ML IJ SOLN
INTRAMUSCULAR | Status: DC | PRN
  Administered 2017-07-23: 50 ug via INTRAVENOUS

## 2017-07-23 MED ORDER — OXYCODONE HCL 5 MG PO TABS
ORAL_TABLET | ORAL | Status: AC
  Filled 2017-07-23: qty 1

## 2017-07-23 MED ORDER — LIDOCAINE 2% (20 MG/ML) 5 ML SYRINGE
INTRAMUSCULAR | Status: AC
  Filled 2017-07-23: qty 5

## 2017-07-23 MED ORDER — MIDAZOLAM HCL 2 MG/2ML IJ SOLN
INTRAMUSCULAR | Status: AC
  Filled 2017-07-23: qty 2

## 2017-07-23 MED ORDER — DEXAMETHASONE SODIUM PHOSPHATE 10 MG/ML IJ SOLN
INTRAMUSCULAR | Status: AC
  Filled 2017-07-23: qty 1

## 2017-07-23 MED ORDER — LEVOFLOXACIN 250 MG PO TABS: 250.0000 mg | ORAL_TABLET | Freq: Every day | ORAL | 0 refills | Status: DC

## 2017-07-23 MED ORDER — IOHEXOL 300 MG/ML  SOLN
INTRAMUSCULAR | Status: DC | PRN
  Administered 2017-07-23: 15 mL via URETHRAL

## 2017-07-23 MED ORDER — LIDOCAINE 2% (20 MG/ML) 5 ML SYRINGE
INTRAMUSCULAR | Status: DC | PRN
  Administered 2017-07-23: 60 mg via INTRAVENOUS

## 2017-07-23 MED ORDER — MIDAZOLAM HCL 2 MG/2ML IJ SOLN
INTRAMUSCULAR | Status: DC | PRN
  Administered 2017-07-23: 2 mg via INTRAVENOUS

## 2017-07-23 MED ORDER — FENTANYL CITRATE (PF) 100 MCG/2ML IJ SOLN
INTRAMUSCULAR | Status: AC
  Filled 2017-07-23: qty 2

## 2017-07-23 MED ORDER — ONDANSETRON HCL 4 MG/2ML IJ SOLN
INTRAMUSCULAR | Status: DC | PRN
  Administered 2017-07-23: 4 mg via INTRAVENOUS

## 2017-07-23 MED ORDER — DEXAMETHASONE SODIUM PHOSPHATE 10 MG/ML IJ SOLN
INTRAMUSCULAR | Status: DC | PRN
  Administered 2017-07-23: 10 mg via INTRAVENOUS

## 2017-07-23 MED ORDER — SCOPOLAMINE 1 MG/3DAYS TD PT72
MEDICATED_PATCH | TRANSDERMAL | Status: AC
  Filled 2017-07-23: qty 1

## 2017-07-23 MED ORDER — CIPROFLOXACIN IN D5W 400 MG/200ML IV SOLN
INTRAVENOUS | Status: AC
  Filled 2017-07-23: qty 200

## 2017-07-23 MED ORDER — LACTATED RINGERS IV SOLN
INTRAVENOUS | Status: DC
  Administered 2017-07-23: 08:00:00 via INTRAVENOUS
  Filled 2017-07-23: qty 1000

## 2017-07-23 MED ORDER — ONDANSETRON HCL 4 MG/2ML IJ SOLN
4.0000 mg | Freq: Once | INTRAMUSCULAR | Status: DC | PRN
  Filled 2017-07-23: qty 2

## 2017-07-23 MED ORDER — ONDANSETRON HCL 4 MG/2ML IJ SOLN
INTRAMUSCULAR | Status: AC
  Filled 2017-07-23: qty 2

## 2017-07-23 MED ORDER — PROPOFOL 10 MG/ML IV BOLUS
INTRAVENOUS | Status: DC | PRN
  Administered 2017-07-23: 200 mg via INTRAVENOUS
  Administered 2017-07-23: 20 mg via INTRAVENOUS

## 2017-07-23 MED ORDER — FENTANYL CITRATE (PF) 100 MCG/2ML IJ SOLN
25.0000 ug | INTRAMUSCULAR | Status: DC | PRN
  Filled 2017-07-23: qty 1

## 2017-07-23 MED ORDER — PROPOFOL 10 MG/ML IV BOLUS
INTRAVENOUS | Status: AC
  Filled 2017-07-23: qty 40

## 2017-07-23 SURGICAL SUPPLY — 30 items
APL SKNCLS STERI-STRIP NONHPOA (GAUZE/BANDAGES/DRESSINGS)
BAG DRAIN URO-CYSTO SKYTR STRL (DRAIN) ×3 IMPLANT
BAG DRN UROCATH (DRAIN) ×1
BASKET STONE 1.7 NGAGE (UROLOGICAL SUPPLIES) IMPLANT
BASKET ZERO TIP NITINOL 2.4FR (BASKET) ×1 IMPLANT
BENZOIN TINCTURE PRP APPL 2/3 (GAUZE/BANDAGES/DRESSINGS) IMPLANT
BSKT STON RTRVL ZERO TP 2.4FR (BASKET)
CATH INTERMIT  6FR 70CM (CATHETERS) ×2 IMPLANT
CLOSURE WOUND 1/2 X4 (GAUZE/BANDAGES/DRESSINGS)
CLOTH BEACON ORANGE TIMEOUT ST (SAFETY) ×3 IMPLANT
FIBER LASER FLEXIVA 365 (UROLOGICAL SUPPLIES) IMPLANT
FIBER LASER TRAC TIP (UROLOGICAL SUPPLIES) IMPLANT
GLOVE BIO SURGEON STRL SZ7.5 (GLOVE) ×1 IMPLANT
GLOVE BIOGEL PI IND STRL 7.5 (GLOVE) IMPLANT
GLOVE BIOGEL PI INDICATOR 7.5 (GLOVE) ×2
GOWN STRL REUS W/TWL XL LVL3 (GOWN DISPOSABLE) ×2 IMPLANT
GUIDEWIRE ANG ZIPWIRE 038X150 (WIRE) ×3 IMPLANT
GUIDEWIRE STR DUAL SENSOR (WIRE) IMPLANT
INFUSOR MANOMETER BAG 3000ML (MISCELLANEOUS) ×3 IMPLANT
IV NS 1000ML (IV SOLUTION)
IV NS 1000ML BAXH (IV SOLUTION) IMPLANT
IV NS IRRIG 3000ML ARTHROMATIC (IV SOLUTION) ×3 IMPLANT
KIT RM TURNOVER CYSTO AR (KITS) ×3 IMPLANT
MANIFOLD NEPTUNE II (INSTRUMENTS) ×3 IMPLANT
NS IRRIG 500ML POUR BTL (IV SOLUTION) ×3 IMPLANT
PACK CYSTO (CUSTOM PROCEDURE TRAY) ×3 IMPLANT
STRIP CLOSURE SKIN 1/2X4 (GAUZE/BANDAGES/DRESSINGS) IMPLANT
SYRINGE 10CC LL (SYRINGE) ×3 IMPLANT
TUBE CONNECTING 12'X1/4 (SUCTIONS)
TUBE CONNECTING 12X1/4 (SUCTIONS) IMPLANT

## 2017-07-23 NOTE — Interval H&P Note (Signed)
History and Physical Interval Note:  07/23/2017 8:15 AM  Erin Wolf  has presented today for surgery, with the diagnosis of HEMATURIA  The various methods of treatment have been discussed with the patient and family. After consideration of risks, benefits and other options for treatment, the patient has consented to  Procedure(s): CYSTOSCOPY/RETROGRADE/URETEROSCOPY (Bilateral) as a surgical intervention .  The patient's history has been reviewed, patient examined, no change in status, stable for surgery.  I have reviewed the patient's chart and labs.  Questions were answered to the patient's satisfaction.     Conception [place]ms [place]

## 2017-07-23 NOTE — Discharge Instructions (Signed)
CYSTOSCOPY HOME CARE INSTRUCTIONS ° °Activity: °Rest for the remainder of the day.  Do not drive or operate equipment today.  You may resume normal activities in one to two days as instructed by your physician.  ° °Meals: °Drink plenty of liquids and eat light foods such as gelatin or soup this evening.  You may return to a normal meal plan tomorrow. ° °Return to Work: °You may return to work in one to two days or as instructed by your physician. ° °Special Instructions / Symptoms: °Call your physician if any of these symptoms occur: ° ° -persistent or heavy bleeding ° -bleeding which continues after first few urination ° -large blood clots that are difficult to pass ° -urine stream diminishes or stops completely ° -fever equal to or higher than 101 degrees Farenheit. ° -cloudy urine with a strong, foul odor ° -severe pain ° °Females should always wipe from front to back after elimination.  You may feel some burning pain when you urinate.  This should disappear with time.  Applying moist heat to the lower abdomen or a hot tub bath may help relieve the pain. \ ° °Follow-Up / Date of Return Visit to Your Physician: as instructed °Call for an appointment to arrange follow-up. ° °Patient Signature:  ________________________________________________________ ° °Nurse's Signature:  ________________________________________________________ ° ° ° °Post Anesthesia Home Care Instructions ° °Activity: °Get plenty of rest for the remainder of the day. A responsible individual must stay with you for 24 hours following the procedure.  °For the next 24 hours, DO NOT: °-Drive a car °-Operate machinery °-Drink alcoholic beverages °-Take any medication unless instructed by your physician °-Make any legal decisions or sign important papers. ° °Meals: °Start with liquid foods such as gelatin or soup. Progress to regular foods as tolerated. Avoid greasy, spicy, heavy foods. If nausea and/or vomiting occur, drink only clear liquids until  the nausea and/or vomiting subsides. Call your physician if vomiting continues. ° °Special Instructions/Symptoms: °Your throat may feel dry or sore from the anesthesia or the breathing tube placed in your throat during surgery. If this causes discomfort, gargle with warm salt water. The discomfort should disappear within 24 hours. ° °If you had a scopolamine patch placed behind your ear for the management of post- operative nausea and/or vomiting: ° °1. The medication in the patch is effective for 72 hours, after which it should be removed.  Wrap patch in a tissue and discard in the trash. Wash hands thoroughly with soap and water. °2. You may remove the patch earlier than 72 hours if you experience unpleasant side effects which may include dry mouth, dizziness or visual disturbances. °3. Avoid touching the patch. Wash your hands with soap and water after contact with the patch. °   ° °

## 2017-07-23 NOTE — Progress Notes (Signed)
Dr. Lovena Neighbours notified that prescriptions not signed.  He will call them in to pharmacy and I well shred the unsigned ones.

## 2017-07-23 NOTE — Anesthesia Procedure Notes (Signed)
Procedure Name: LMA Insertion Date/Time: 07/23/2017 8:42 AM Performed by: Wanita Chamberlain Pre-anesthesia Checklist: Patient identified, Timeout performed, Emergency Drugs available, Suction available and Patient being monitored Patient Re-evaluated:Patient Re-evaluated prior to induction Oxygen Delivery Method: Circle system utilized Preoxygenation: Pre-oxygenation with 100% oxygen Induction Type: IV induction Ventilation: Mask ventilation without difficulty LMA: LMA inserted LMA Size: 4.0 Number of attempts: 1 Placement Confirmation: breath sounds checked- equal and bilateral and positive ETCO2 Tube secured with: Tape Dental Injury: Teeth and Oropharynx as per pre-operative assessment

## 2017-07-23 NOTE — Transfer of Care (Signed)
Immediate Anesthesia Transfer of Care Note  Patient: Erin Wolf  Procedure(s) Performed: CYSTOSCOPY/RETROGRADE/URETEROSCOPY (Bilateral Bladder)  Patient Location: PACU  Anesthesia Type:General  Level of Consciousness: awake, alert , oriented and patient cooperative  Airway & Oxygen Therapy: Patient Spontanous Breathing and Patient connected to nasal cannula oxygen  Post-op Assessment: Report given to RN and Post -op Vital signs reviewed and stable  Post vital signs: Reviewed and stable  Last Vitals:  Vitals:   07/23/17 0658 07/23/17 0923  BP: (!) 143/96   Pulse: 87   Resp: 16   Temp: 36.6 C (!) (P) 36.3 C  SpO2: 98% (P) 98%    Last Pain:  Vitals:   07/23/17 0701  TempSrc:   PainSc: 3       Patients Stated Pain Goal: 7 (74/82/70 7867)  Complications: No apparent anesthesia complications

## 2017-07-23 NOTE — Anesthesia Preprocedure Evaluation (Addendum)
Anesthesia Evaluation  Patient identified by MRN, date of birth, ID band Patient awake    Reviewed: Allergy & Precautions, NPO status , Patient's Chart, lab work & pertinent test results  Airway Mallampati: II  TM Distance: >3 FB Neck ROM: Full    Dental  (+) Teeth Intact, Dental Advisory Given   Pulmonary neg pulmonary ROS,    Pulmonary exam normal breath sounds clear to auscultation       Cardiovascular hypertension, Pt. on medications negative cardio ROS Normal cardiovascular exam Rhythm:Regular Rate:Normal     Neuro/Psych Seizures -, Well Controlled,  PSYCHIATRIC DISORDERS Anxiety Depression    GI/Hepatic GERD  Medicated,(+) Hepatitis -, CUC   Endo/Other  Morbid obesitySLE  Renal/GU negative Renal ROS   Hematuria     Musculoskeletal  (+) Arthritis , Fibromyalgia -  Abdominal   Peds  Hematology negative hematology ROS (+) anemia ,   Anesthesia Other Findings Day of surgery medications reviewed with the patient.  Reproductive/Obstetrics                            Anesthesia Physical Anesthesia Plan  ASA: III  Anesthesia Plan: General   Post-op Pain Management:    Induction: Intravenous  PONV Risk Score and Plan: 4 or greater and Ondansetron, Dexamethasone, Midazolam and Scopolamine patch - Pre-op  Airway Management Planned: LMA  Additional Equipment:   Intra-op Plan:   Post-operative Plan: Extubation in OR  Informed Consent: I have reviewed the patients History and Physical, chart, labs and discussed the procedure including the risks, benefits and alternatives for the proposed anesthesia with the patient or authorized representative who has indicated his/her understanding and acceptance.   Dental advisory given  Plan Discussed with: CRNA  Anesthesia Plan Comments: (Risks/benefits of general anesthesia discussed with patient including risk of damage to teeth, lips,  gum, and tongue, nausea/vomiting, allergic reactions to medications, and the possibility of heart attack, stroke and death.  All patient questions answered.  Patient wishes to proceed.)        Anesthesia Quick Evaluation

## 2017-07-23 NOTE — Op Note (Signed)
Operative Note  Preoperative diagnosis:  1. Hematuria 2. Flank/back pain  Postoperative diagnosis: 1. Hematuria 2. Flank/back pain  Procedure(s): 1. Cystoscopy with bilateral retrograde pyelograms with intraoperative interpretation of fluoroscopic imaging  Surgeon: Ellison Hughs, MD  Anesthesia: Gen  Complications: None  EBL: 0 mL  Specimens: 1. None  Drains/Catheters: 1. None  Intraoperative findings: Normal bladder mucosa.  No filling defects or abnormalities seen on bilateral RPGs.  No immediately identifiable source of hematuria or her on-going flank/back pain.  Indication: Erin Wolf is a 61 year old female with intermittent episodes of perceived gross hematuria associated with flank pain and subjective fevers.  She was recently evaluated at the [facility] emergency department on 06/06/2017 and all lab work at that time including a urinalysis, BMP, CBC and lactic acid were all within the normal range.  She had a CT stone study at that time that was unremarkable from a urologic standpoint.  During her evaluation in the office, the patient refused to have a flexible cystoscopy performed and requested general anesthesia for further cystoscopic evaluation of her hematuria.  Description of procedure: After informed consent was obtained, the patient was brought to the operating room and general LMA anesthesia was administered. The patient was then placed in the dorsolithotomy position and prepped and draped in usual sterile fashion. A timeout was performed. A 21 French rigid cystoscope was then inserted into the urethral meatus and advanced into the bladder under direct vision. A complete bladder survey revealed no intravesical pathology.  A 6 French open-ended catheter was then carefully inserted into the right ureteral orifice. A retrograde pyelogram was obtained that showed no filling defects along the entire length of the right ureter, crisp outlining of all right renal  calyces and no filling defects were observed within the right renal pelvis. The contrast briskly drained from the right collecting system following catheter removal. There was coordinated peristaltic activity of the ureteral orifice. A left retrograde pyelogram was obtained in a similar fashion. There were no filling defects seen along the entire length of the left ureter. There was crisp outlining of all left renal calyces as well as left renal pelvis with no filling defects seen. The open-ended catheter was removed and normal peristaltic expulsion of contrast and urine was seen with brisk stranding of the left collecting system.  Plan:  Follow-up in 3 weeks

## 2017-07-23 NOTE — Anesthesia Postprocedure Evaluation (Signed)
Anesthesia Post Note  Patient: Erin Wolf  Procedure(s) Performed: CYSTOSCOPY/RETROGRADE/URETEROSCOPY (Bilateral Bladder)     Patient location during evaluation: PACU Anesthesia Type: General Level of consciousness: awake and alert Pain management: pain level controlled Vital Signs Assessment: post-procedure vital signs reviewed and stable Respiratory status: spontaneous breathing, nonlabored ventilation and respiratory function stable Cardiovascular status: blood pressure returned to baseline and stable Postop Assessment: no apparent nausea or vomiting Anesthetic complications: no    Last Vitals:  Vitals:   07/23/17 1045 07/23/17 1203  BP: (!) 146/93 (!) 143/78  Pulse: 78   Resp: 17 14  Temp:  36.6 C  SpO2: 97% 94%    Last Pain:  Vitals:   07/23/17 1203  TempSrc: Oral  PainSc: [name]

## 2017-07-24 ENCOUNTER — Encounter (HOSPITAL_BASED_OUTPATIENT_CLINIC_OR_DEPARTMENT_OTHER): Payer: Self-pay | Admitting: Urology

## 2017-08-01 ENCOUNTER — Emergency Department (HOSPITAL_COMMUNITY)
Admission: EM | Admit: 2017-08-01 | Discharge: 2017-08-02 | Disposition: A | Discharge status: Home / Self Care | Payer: Medicare Other | Attending: Emergency Medicine | Admitting: Emergency Medicine

## 2017-08-01 ENCOUNTER — Encounter (HOSPITAL_COMMUNITY): Payer: Self-pay

## 2017-08-01 ENCOUNTER — Emergency Department (HOSPITAL_COMMUNITY): Payer: Medicare Other

## 2017-08-01 DIAGNOSIS — R109 Unspecified abdominal pain: Secondary | ICD-10-CM | POA: Diagnosis present

## 2017-08-01 DIAGNOSIS — N10 Acute tubulo-interstitial nephritis: Secondary | ICD-10-CM | POA: Diagnosis not present

## 2017-08-01 DIAGNOSIS — N12 Tubulo-interstitial nephritis, not specified as acute or chronic: Secondary | ICD-10-CM

## 2017-08-01 DIAGNOSIS — I10 Essential (primary) hypertension: Secondary | ICD-10-CM | POA: Diagnosis not present

## 2017-08-01 DIAGNOSIS — Z79899 Other long term (current) drug therapy: Secondary | ICD-10-CM | POA: Insufficient documentation

## 2017-08-01 DIAGNOSIS — Z91040 Latex allergy status: Secondary | ICD-10-CM | POA: Diagnosis not present

## 2017-08-01 DIAGNOSIS — R319 Hematuria, unspecified: Secondary | ICD-10-CM | POA: Diagnosis not present

## 2017-08-01 LAB — I-STAT CHEM 8, ED
BUN: 12 mg/dL (ref 6–20)
Calcium, Ion: 1.02 mmol/L — ABNORMAL LOW (ref 1.15–1.40)
Chloride: 104 mmol/L (ref 101–111)
Creatinine, Ser: 0.5 mg/dL (ref 0.44–1.00)
Glucose, Bld: 93 mg/dL (ref 65–99)
HCT: 43 % (ref 36.0–46.0)
Hemoglobin: 14.6 g/dL (ref 12.0–15.0)
Potassium: 3.8 mmol/L (ref 3.5–5.1)
Sodium: 138 mmol/L (ref 135–145)
TCO2: 25 mmol/L (ref 22–32)

## 2017-08-01 LAB — COMPREHENSIVE METABOLIC PANEL
ALT: 24 U/L (ref 14–54)
AST: 22 U/L (ref 15–41)
Albumin: 4 g/dL (ref 3.5–5.0)
Alkaline Phosphatase: 79 U/L (ref 38–126)
Anion gap: 11 (ref 5–15)
BUN: 12 mg/dL (ref 6–20)
CALCIUM: 9.2 mg/dL (ref 8.9–10.3)
CHLORIDE: 101 mmol/L (ref 101–111)
CO2: 24 mmol/L (ref 22–32)
Creatinine, Ser: 0.68 mg/dL (ref 0.44–1.00)
GFR calc Af Amer: >60 mL/min (ref >60)
Glucose, Bld: 90 mg/dL (ref 65–99)
POTASSIUM: 4 mmol/L (ref 3.5–5.1)
Sodium: 136 mmol/L (ref 135–145)
TOTAL PROTEIN: 8.5 g/dL — AB (ref 6.5–8.1)
Total Bilirubin: 0.6 mg/dL (ref 0.3–1.2)

## 2017-08-01 LAB — URINALYSIS, ROUTINE W REFLEX MICROSCOPIC
Bilirubin Urine: NEGATIVE
GLUCOSE, UA: NEGATIVE mg/dL
Ketones, ur: NEGATIVE mg/dL
NITRITE: NEGATIVE
PROTEIN: 100 mg/dL — AB
Specific Gravity, Urine: 1.028 (ref 1.005–1.030)
pH: 6 (ref 5.0–8.0)

## 2017-08-01 LAB — CBC WITH DIFFERENTIAL/PLATELET
Basophils Absolute: 0 K/uL (ref 0.0–0.1)
Basophils Relative: 0 %
Eosinophils Absolute: 0 K/uL (ref 0.0–0.7)
Eosinophils Relative: 0 %
HCT: 42 % (ref 36.0–46.0)
Hemoglobin: 13.5 g/dL (ref 12.0–15.0)
Lymphocytes Relative: 55 %
Lymphs Abs: 3.2 K/uL (ref 0.7–4.0)
MCH: 26.2 pg (ref 26.0–34.0)
MCHC: 32.1 g/dL (ref 30.0–36.0)
MCV: 81.6 fL (ref 78.0–100.0)
Monocytes Absolute: 0.6 K/uL (ref 0.1–1.0)
Monocytes Relative: 9 %
Neutro Abs: 2.1 K/uL (ref 1.7–7.7)
Neutrophils Relative %: 36 %
Platelets: 308 K/uL (ref 150–400)
RBC: 5.15 MIL/uL — ABNORMAL HIGH (ref 3.87–5.11)
RDW: 15.7 % — ABNORMAL HIGH (ref 11.5–15.5)
WBC: 5.9 K/uL (ref 4.0–10.5)

## 2017-08-01 LAB — I-STAT CG4 LACTIC ACID, ED: LACTIC ACID, VENOUS: 1.12 mmol/L (ref 0.5–1.9)

## 2017-08-01 LAB — LIPASE, BLOOD: LIPASE: 20 U/L (ref 11–51)

## 2017-08-01 MED ORDER — FENTANYL CITRATE (PF) 100 MCG/2ML IJ SOLN
50.0000 ug | Freq: Once | INTRAMUSCULAR | Status: AC
  Administered 2017-08-01: 50 ug via INTRAVENOUS
  Filled 2017-08-01: qty 2

## 2017-08-01 MED ORDER — SODIUM CHLORIDE 0.9 % IV BOLUS (SEPSIS)
1000.0000 mL | Freq: Once | INTRAVENOUS | Status: AC
  Administered 2017-08-01: 1000 mL via INTRAVENOUS

## 2017-08-01 MED ORDER — DEXTROSE 5 % IV SOLN: 1.0000 g | Freq: Once | INTRAVENOUS | Status: DC

## 2017-08-01 MED ORDER — PHENAZOPYRIDINE HCL 200 MG PO TABS: 200.0000 mg | ORAL_TABLET | Freq: Three times a day (TID) | ORAL | 0 refills | Status: DC

## 2017-08-01 MED ORDER — SULFAMETHOXAZOLE-TRIMETHOPRIM 800-160 MG PO TABS: 1.0000 | ORAL_TABLET | Freq: Two times a day (BID) | ORAL | 0 refills | Status: DC

## 2017-08-01 MED ORDER — IOPAMIDOL (ISOVUE-300) INJECTION 61%
INTRAVENOUS | Status: AC
  Administered 2017-08-01: 100 mL
  Filled 2017-08-01: qty 100

## 2017-08-01 MED ORDER — CEFTRIAXONE SODIUM 1 G IJ SOLR
1.0000 g | Freq: Once | INTRAMUSCULAR | Status: AC
  Administered 2017-08-01: 1 g via INTRAMUSCULAR
  Filled 2017-08-01: qty 10

## 2017-08-01 MED ORDER — LIDOCAINE HCL (PF) 2 % IJ SOLN
INTRAMUSCULAR | Status: AC
  Administered 2017-08-01: 10 mL
  Filled 2017-08-01: qty 10

## 2017-08-01 NOTE — ED Triage Notes (Signed)
Patient states she has been having difficulty urinating and blood in her urine x 3 weeks. Patient states she had a cystoscopy last week. Patient states she has been antibiotics x 3 rounds and continues to have painful urination and hematuria.

## 2017-08-01 NOTE — ED Provider Notes (Signed)
[place] DEPT [facility] Provider Note   CSN: [id number] Arrival date & time: 08/01/17  1339     History   Chief Complaint Chief Complaint  Patient presents with  . Abdominal Pain  . Dysuria    HPI LACREASHA HINDS is a 61 y.o. female with past medical history of diverticulitis, ulcers colitis, hepatitis C who presents emergency department today for 2 month history of dysuria. The patient notes that 2 months ago she started having dysuria, frequency as well as hematuria. She was seen by her PCP for this and put on unknown abx. She says the abx helped but once she stopped the abx the symptoms came back. She was seen in the ED for this on 06/06/17 with negative reassuring blood work, negative UA, and negative CT renal stone scan. She then reports going to her PCP and getting another abx that helped for 7 days and then the symptoms returned. She was seen by a urologist, Dr. Lovena Neighbours, on 07/21/17 & 07/23/17 for this with a negative cystoscopy. The patient is reporting that her symptoms are continuing. Over the last 2-3 days she has also developed a constant, crampy abdominal pain in the periumbilical area with associated nausea, chills, anorexia and fever of 103F (taken orally yesterday). No fever today. No emesis. She reports she has been taking oxycodone and using a heating pad for this with mild to moderate relief. Pain has not moved and there is no radiation. LBM today and she reports loose. No hematochezia or melena. The patient has had abdominal hysterectomy and b/l salpingectomy and  Cholecystectomy in the past.   HPI  Past Medical History:  Diagnosis Date  . Anxiety   . Arthritis   . Chronic abdominal pain   . Chronic lower back pain   . Chronic nausea   . Depression   . Diverticulosis   . Fibromyalgia   . Frequency of urination   . GERD (gastroesophageal reflux disease)   . Hematuria   . Hepatitis C antibody positive in blood    per pt told by pcp  . History of panic attacks   .  History of syncope    hx recurrent syncope --- per epic domentation non-cardiac , orthostatic hypotension, anxiety, dehydration  . History of uterine fibroid   . HTN (hypertension), benign   . Hyperlipidemia 01/03/2012  . IBS (irritable bowel syndrome)   . Myalgia   . Seizure disorder ([place]) followed by pcp until new neurologist appr. in jan 2019   "started having them in my 20's" ,  petit mal ----  per pt last seizure one 2015  . SLE (systemic lupus erythematosus) [facility])    rheumatologist-- dr Gerilyn Nestle (consult 11-12-2016) having work-up done  . Ulcerative colitis    followed by dr Penelope Coop at [place]  . Wears glasses     Patient Active Problem List   Diagnosis Date Noted  . Nausea & vomiting 10/18/2016  . History of ulcerative colitis   . LLQ abdominal pain 10/17/2016  . Syncope and collapse 06/29/2016  . Left-sided weakness 06/29/2016  . Facial twitching 06/29/2016  . IBS (irritable bowel syndrome) 06/29/2016  . Faintness   . Dehydration 05/09/2014  . GERD (gastroesophageal reflux disease) 05/09/2014  . Gastroenteritis 05/09/2014  . Chest pain, atypical 05/09/2014  . Chronic ulcerative colitis ([place]) 05/09/2014  . Acute gastroenteritis 05/09/2014  . Ulcerative colitis ([place]) 05/27/2013  . Hypokalemia 05/27/2013  . Anemia 01/04/2012  . Transaminitis 01/04/2012  . Diarrhea 01/04/2012  . Chest pain  01/03/2012  . SOB (shortness of breath) 01/03/2012  . HTN (hypertension), benign 01/03/2012  . Hx of migraines 01/03/2012  . Seizure ([place]) 01/03/2012  . Depression 01/03/2012  . Ulcerative colitis 01/03/2012  . Chronic back pain 01/03/2012  . 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 Hyperlipidemia 01/03/2012  . Hepatitis C 01/03/2012  . Fibroids 01/03/2012    Past Surgical History:  Procedure Laterality Date  . ABDOMINAL HYSTERECTOMY  1988  . CHOLECYSTECTOMY OPEN  1978  . COLONOSCOPY N/A 05/31/2013   Procedure: COLONOSCOPY;  Surgeon: Wonda Horner, MD;  Location[facility] ENDOSCOPY;   Service: Endoscopy;  Laterality: N/A;  . CYSTOSCOPY/RETROGRADE/URETEROSCOPY Bilateral 07/23/2017   Procedure: CYSTOSCOPY/RETROGRADE/URETEROSCOPY;  Surgeon: Ceasar Mons, MD;  Location: [facility];  Service: Urology;  Laterality: Bilateral;  . ESOPHAGOGASTRODUODENOSCOPY (EGD) WITH PROPOFOL N/A 05/11/2014   Procedure: ESOPHAGOGASTRODUODENOSCOPY (EGD) WITH PROPOFOL;  Surgeon: Lear Ng, MD;  Location: [facility] ENDOSCOPY;  Service: Endoscopy;  Laterality: N/A;  egd first  . EUS N/A 06/21/2016   Procedure: ESOPHAGEAL ENDOSCOPIC ULTRASOUND (EUS) RADIAL;  Surgeon: Arta Silence, MD;  Location: [facility] ENDOSCOPY;  Service: Endoscopy;  Laterality: N/A;  . FLEXIBLE SIGMOIDOSCOPY N/A 05/11/2014   Procedure: FLEXIBLE SIGMOIDOSCOPY;  Surgeon: Lear Ng, MD;  Location: [facility] ENDOSCOPY;  Service: Endoscopy;  Laterality: N/A;  . LAPAROTOMY W/ BILATERAL SALPINGOOPHORECTOMY  09-26-2003   dr Matthew Saras at [facility]  . LAPROSCOPY W/ LYSIS ADHESIONS  06-08-2003   dr Radene Knee [facility]  . TRANSTHORACIC ECHOCARDIOGRAM  04/29/2016   ef 60-65%,  grade 2 diastolic dysfunction/  trivial MR and TR  . TUBAL LIGATION Bilateral yrs ago    OB History    No data available       Home Medications    Prior to Admission medications   Medication Sig Start Date End Date Taking? Authorizing Provider  diazepam (VALIUM) 5 MG tablet Take 5 mg by mouth 2 (two) times daily as needed for anxiety. 05/07/17  Yes [provider]  FLUoxetine (PROZAC) 20 MG capsule Take 20 mg by mouth every morning.   Yes [provider]  furosemide (LASIX) 20 MG tablet Take 20 mg by mouth every morning.   Yes [provider]  levETIRAcetam (KEPPRA XR) 500 MG 24 hr tablet Take 1-2 tablets (500-1,000 mg total) by mouth 2 (two) times daily. Take 1 tablet in the mornings and 2 tabs at bedtime. Patient taking differently: Take 500-1,000 mg by mouth See admin instructions. Take 500 mg in the mornings and1000 mg at  bedtime. 07/01/16  Yes Hongalgi, Lenis Dickinson, MD  mesalamine (ROWASA) 4 g enema Place 4 g rectally as needed ([facility]).    Yes [provider]  metoCLOPramide (REGLAN) 10 MG tablet Take 1 tablet (10 mg total) by mouth every 6 (six) hours. Patient taking differently: Take 10 mg by mouth every 6 (six) hours as needed for nausea or vomiting.  04/10/17  Yes Charlesetta Shanks, MD  ondansetron (ZOFRAN ODT) 8 MG disintegrating tablet Take 1 tablet (8 mg total) by mouth every 8 (eight) hours as needed for nausea or vomiting. 06/06/17  Yes Jola Schmidt, MD  Oxycodone HCl 10 MG TABS Take 10 mg by mouth 3 (three) times daily as needed for pain. 05/21/17  Yes [provider]  levofloxacin (LEVAQUIN) 250 MG tablet Take 1 tablet (250 mg total) by mouth daily. Patient not taking: Reported on 08/01/2017 07/23/17   Ceasar Mons, MD  phenazopyridine (PYRIDIUM) 200 MG tablet Take 1 tablet (200 mg total) by mouth 3 (  three) times daily. 08/01/17   Sheila Gervasi, Barth Kirks, PA-C  sulfamethoxazole-trimethoprim (BACTRIM DS,SEPTRA DS) 800-160 MG tablet Take 1 tablet by mouth 2 (two) times daily. 08/01/17   Karia Ehresman, Barth Kirks, PA-C    Family History Family History  Problem Relation Age of Onset  . Colon cancer Brother     Social History Social History  Substance Use Topics  . Smoking status: Never Smoker  . Smokeless tobacco: Never Used  . Alcohol use Yes     Comment: very rare     Allergies   Latex; Nsaids; Penicillins; Wellbutrin [bupropion hcl]; Aspirin; Darvocet [propoxyphene n-acetaminophen]; Isometheptene-dichloral-apap; Tape; Toradol [ketorolac tromethamine]; Tylenol [acetaminophen]; Milk-related compounds; Other; and Dilaudid [hydromorphone hcl]   Review of Systems Review of Systems  All other systems reviewed and are negative.    Physical Exam Updated Vital Signs BP 130/80 (BP Location: Left Arm)   Pulse 98   Temp 98.5 F (36.9 C) (Oral)   Resp 18   Ht 5\' 5"  (1.651 m)   Wt 106.6  kg (235 lb)   SpO2 96%   BMI 39.11 kg/m   Physical Exam  Constitutional: She appears well-developed and well-nourished.  HENT:  Head: Normocephalic and atraumatic.  Right Ear: External ear normal.  Left Ear: External ear normal.  Nose: Nose normal.  Mouth/Throat: Uvula is midline, oropharynx is clear and moist and mucous membranes are normal. No tonsillar exudate.  Eyes: Pupils are equal, round, and reactive to light. Right eye exhibits no discharge. Left eye exhibits no discharge. No scleral icterus.  Neck: Trachea normal. Neck supple. No spinous process tenderness present. No neck rigidity. Normal range of motion present.  Cardiovascular: Normal rate, regular rhythm and intact distal pulses.   No murmur heard. Pulses:      Radial pulses are 2+ on the right side, and 2+ on the left side.       Dorsalis pedis pulses are 2+ on the right side, and 2+ on the left side.       Posterior tibial pulses are 2+ on the right side, and 2+ on the left side.  No lower extremity swelling or edema. Calves symmetric in size bilaterally.  Pulmonary/Chest: Effort normal and breath sounds normal. She exhibits no tenderness.  Abdominal: Soft. Bowel sounds are normal. There is tenderness in the right lower quadrant, suprapubic area and left lower quadrant. There is tenderness at McBurney's point. There is no rebound, no guarding and no CVA tenderness.  Negative Rovsing sign. Rebound TTP of RLQ.   Musculoskeletal: She exhibits no edema.  Lymphadenopathy:    She has no cervical adenopathy.  Neurological: She is alert.  Skin: Skin is warm and dry. No rash noted. She is not diaphoretic.  Psychiatric: She has a normal mood and affect.  Nursing note and vitals reviewed.    ED Treatments / Results  Labs (all labs ordered are listed, but only abnormal results are displayed) Labs Reviewed  URINE CULTURE - Abnormal; Notable for the following:       Result Value   Culture MULTIPLE SPECIES PRESENT, SUGGEST  RECOLLECTION (*)    All other components within normal limits  URINALYSIS, ROUTINE W REFLEX MICROSCOPIC - Abnormal; Notable for the following:    Color, Urine RED (*)    APPearance CLOUDY (*)    Hgb urine dipstick LARGE (*)    Protein, ur 100 (*)    Leukocytes, UA MODERATE (*)    Bacteria, UA FEW (*)    Squamous Epithelial / LPF 0-5 (*)  All other components within normal limits  CBC WITH DIFFERENTIAL/PLATELET - Abnormal; Notable for the following:    RBC 5.15 (*)    RDW 15.7 (*)    All other components within normal limits  COMPREHENSIVE METABOLIC PANEL - Abnormal; Notable for the following:    Total Protein 8.5 (*)    All other components within normal limits  I-STAT CHEM 8, ED - Abnormal; Notable for the following:    Calcium, Ion 1.02 (*)    All other components within normal limits  LIPASE, BLOOD  I-STAT CG4 LACTIC ACID, ED    EKG  EKG Interpretation None       Radiology Ct Abdomen Pelvis W Contrast  Result Date: 08/01/2017 CLINICAL DATA:  Acute onset of dysuria and hematuria. Initial encounter. EXAM: CT ABDOMEN AND PELVIS WITH CONTRAST TECHNIQUE: Multidetector CT imaging of the abdomen and pelvis was performed using the standard protocol following bolus administration of intravenous contrast. CONTRAST:  168mL ISOVUE-300 IOPAMIDOL (ISOVUE-300) INJECTION 61% COMPARISON:  CT of the abdomen and pelvis from 06/06/2017 FINDINGS: Lower chest: Minimal bibasilar atelectasis is noted. The visualized portions of the mediastinum are unremarkable. Hepatobiliary: The liver is unremarkable in appearance. The patient is status post cholecystectomy. The common bile duct is within normal limits status post cholecystectomy. Pancreas: The pancreas is within normal limits. Spleen: The spleen is unremarkable in appearance. Adrenals/Urinary Tract: A small left adrenal adenoma is noted. The adrenal glands are otherwise unremarkable. The kidneys are within normal limits. There is no evidence of  hydronephrosis. No renal or ureteral stones are identified. No perinephric stranding is seen. Stomach/Bowel: The stomach is unremarkable in appearance. The small bowel is within normal limits. The appendix is normal in caliber, without evidence of appendicitis. Mild diverticulosis is noted at the proximal sigmoid colon, without evidence of diverticulitis. Vascular/Lymphatic: The abdominal aorta is unremarkable in appearance. The inferior vena cava is grossly unremarkable. No retroperitoneal lymphadenopathy is seen. No pelvic sidewall lymphadenopathy is identified. Reproductive: The bladder is mildly distended. Mild bladder wall thickening may reflect cystitis. The patient is status post hysterectomy. No suspicious adnexal masses are seen. Other: No additional soft tissue abnormalities are seen. Musculoskeletal: No acute osseous abnormalities are identified. Multilevel vacuum phenomenon is noted along the lower lumbar spine. The visualized musculature is unremarkable in appearance. IMPRESSION: 1. Mild bladder wall thickening may reflect cystitis. 2. Appendix unremarkable in appearance. 3. Mild diverticulosis at the proximal sigmoid colon, without evidence of diverticulitis. 4. Small left adrenal adenoma noted. Electronically Signed   By: Garald Balding M.D.   On: 08/01/2017 22:10    Procedures Procedures (including critical care time)  Medications Ordered in ED Medications  sodium chloride 0.9 % bolus 1,000 mL (0 mLs Intravenous Stopped 08/02/17 0037)  fentaNYL (SUBLIMAZE) injection 50 mcg (50 mcg Intravenous Given 08/01/17 2126)  iopamidol (ISOVUE-300) 61 % injection (100 mLs  Contrast Given 08/01/17 2134)  cefTRIAXone (ROCEPHIN) injection 1 g (1 g Intramuscular Given 08/01/17 2323)  lidocaine (XYLOCAINE) 2 % injection (10 mLs  Given 08/01/17 2322)     Initial Impression / Assessment and Plan / ED Course  I have reviewed the triage vital signs and the nursing notes.  Pertinent labs & imaging  results that were available during my care of the patient were reviewed by me and considered in my medical decision making (see chart for details).     This is a 61 year old female presenting to the ED today for 2 month history of dysuria, frequency and hematuria. Has  had abx in the past, negative workups including Ct scan and cystoscopy. Patient also reporting fever, chills, nausea, and crampy periumbilical abdominal pain. Vital signs are reassuring. Exam with no CVA TTP. Patient with lower abdomen TTP and rebound (patient asks why does it hurt more when your remove your hand then when your press in). Will order UA, blood work and CT to evaluate abdominal pain and urinary symptoms.   Lactic acid without lactic acidosis. No leukocytosis or anemia on CBC. No electrolyte abnormalities, AKI on CMP. LFTs normal. No anion gap acidosis. Lipase wnl. CT scan with bladder wall thickening possible to be related to cystitis, normal appendix, diverticulosis without diverticulitis. There is also a small left adrenal adenoma that is present. Discussed this with the patient and placed in patient dc paperwork as well. Patient to follow up with PCP for this. UA with signs of UTI. Will treat as pyelo as patient has been having systemic signs today. Dose of Rocephin given in the department. Patient to be discharged home on Bactrim and urine culture sent.    I advised the patient to follow-up with her PCP this week. I advised the patient to return to the emergency department with new or worsening symptoms or new concerns. Specific return precautions discussed. The patient verbalized understanding and agreement with plan. All questions answered. No further questions at this time. The patient is hemodynamically stable, mentating appropriately and appears safe for discharge.  Patient case discussed with Dr. Vanita Panda who is in agreement with plan.  Final Clinical Impressions(s) / ED Diagnoses   Final diagnoses:    Pyelonephritis    New Prescriptions Discharge Medication List as of 08/01/2017 11:45 PM    START taking these medications   Details  sulfamethoxazole-trimethoprim (BACTRIM DS,SEPTRA DS) 800-160 MG tablet Take 1 tablet by mouth 2 (two) times daily., Starting Fri 08/01/2017, Print         Jillyn Ledger, PA-C 08/03/17 1030    Carmin Muskrat, MD 08/03/17 [phone]

## 2017-08-01 NOTE — Discharge Instructions (Signed)
Please read and follow all provided instructions You have been seen today for your complaint of pain with urination. You were found to have a kidney infection.  Pyelonephritis is a kidney infection. The kidneys are the organs that filter a person's blood and move waste out of the bloodstream and into the urine. Urine passes from the kidneys, through the ureters, and into the bladder.  Your discharge medications include: 1) Bactrim Please take all of your antibiotics until finished!   You may develop abdominal discomfort or diarrhea from the antibiotic.  You may help offset this with probiotics which you can buy or get in yogurt. Do not eat or take the probiotics until 2 hours after your antibiotic. Do not take your medicine if develop an itchy rash, swelling in your mouth or lips, or difficulty breathing.  2) Pyridium  This medication will help relieve pain and burning but does not treat the infection.  Make sure that you wear a panty liner as it may stain your underwear. Do not be alarmed if this turns your urine orange. To void upset stomach please take with food. Home care instructions are as follows:  1) Please drink plenty of water. Avoid tea and beverages with caffeine like coffee or soda 2) If you are sexually active, make sure to urinate immediately after intercourse.  Follow up:  Please follow up with your primary care physician in 1-2 days. If you do not have one please call the [place] number listed above.  You were found to have a incidental finding on CT including a adrenal adenoma. Discuss this with your PCP during your follow up.  Please seek immediate medical care if you develop any of the following symptoms:  Your vital signs today were: BP (!) 146/87 (BP Location: Left Arm)    Pulse 86    Temp 98.4 F (36.9 C) (Oral)    Resp 20    Ht 5\' 5"  (1.651 m)    Wt 106.6 kg (235 lb)    SpO2 97%    BMI 39.11 kg/m  If your blood pressure (BP) was elevated above  135/85 this visit, please have this repeated by your doctor within one month. ---------------

## 2017-08-03 LAB — URINE CULTURE

## 2017-11-26 DIAGNOSIS — I10 Essential (primary) hypertension: Secondary | ICD-10-CM | POA: Diagnosis not present

## 2017-11-26 DIAGNOSIS — G40909 Epilepsy, unspecified, not intractable, without status epilepticus: Secondary | ICD-10-CM | POA: Diagnosis not present

## 2017-11-26 DIAGNOSIS — G894 Chronic pain syndrome: Secondary | ICD-10-CM | POA: Diagnosis not present

## 2017-11-26 DIAGNOSIS — F324 Major depressive disorder, single episode, in partial remission: Secondary | ICD-10-CM | POA: Diagnosis not present

## 2017-11-26 DIAGNOSIS — E7800 Pure hypercholesterolemia, unspecified: Secondary | ICD-10-CM | POA: Diagnosis not present

## 2017-11-26 DIAGNOSIS — R7303 Prediabetes: Secondary | ICD-10-CM | POA: Diagnosis not present

## 2017-11-26 DIAGNOSIS — F419 Anxiety disorder, unspecified: Secondary | ICD-10-CM | POA: Diagnosis not present

## 2017-12-05 DIAGNOSIS — R51 Headache: Secondary | ICD-10-CM | POA: Diagnosis not present

## 2017-12-05 DIAGNOSIS — H524 Presbyopia: Secondary | ICD-10-CM | POA: Diagnosis not present

## 2017-12-05 DIAGNOSIS — H52223 Regular astigmatism, bilateral: Secondary | ICD-10-CM | POA: Diagnosis not present

## 2017-12-05 DIAGNOSIS — H40013 Open angle with borderline findings, low risk, bilateral: Secondary | ICD-10-CM | POA: Diagnosis not present

## 2017-12-05 DIAGNOSIS — H04123 Dry eye syndrome of bilateral lacrimal glands: Secondary | ICD-10-CM | POA: Diagnosis not present

## 2017-12-05 DIAGNOSIS — H2513 Age-related nuclear cataract, bilateral: Secondary | ICD-10-CM | POA: Diagnosis not present

## 2017-12-05 DIAGNOSIS — H5213 Myopia, bilateral: Secondary | ICD-10-CM | POA: Diagnosis not present

## 2017-12-31 ENCOUNTER — Emergency Department (HOSPITAL_COMMUNITY)
Admission: EM | Admit: 2017-12-31 | Discharge: 2018-01-01 | Disposition: A | Discharge status: Home / Self Care | Payer: Medicare Other | Attending: Emergency Medicine | Admitting: Emergency Medicine

## 2017-12-31 ENCOUNTER — Encounter (HOSPITAL_COMMUNITY): Payer: Self-pay

## 2017-12-31 ENCOUNTER — Other Ambulatory Visit: Payer: Self-pay

## 2017-12-31 ENCOUNTER — Emergency Department (HOSPITAL_COMMUNITY): Payer: Medicare Other

## 2017-12-31 DIAGNOSIS — R040 Epistaxis: Secondary | ICD-10-CM | POA: Insufficient documentation

## 2017-12-31 DIAGNOSIS — Z91040 Latex allergy status: Secondary | ICD-10-CM | POA: Diagnosis not present

## 2017-12-31 DIAGNOSIS — I10 Essential (primary) hypertension: Secondary | ICD-10-CM | POA: Diagnosis not present

## 2017-12-31 DIAGNOSIS — R000 Tachycardia, unspecified: Secondary | ICD-10-CM | POA: Diagnosis not present

## 2017-12-31 DIAGNOSIS — R1030 Lower abdominal pain, unspecified: Secondary | ICD-10-CM | POA: Diagnosis not present

## 2017-12-31 DIAGNOSIS — R51 Headache: Secondary | ICD-10-CM | POA: Diagnosis not present

## 2017-12-31 DIAGNOSIS — R109 Unspecified abdominal pain: Secondary | ICD-10-CM

## 2017-12-31 DIAGNOSIS — R071 Chest pain on breathing: Secondary | ICD-10-CM | POA: Diagnosis not present

## 2017-12-31 DIAGNOSIS — Z79899 Other long term (current) drug therapy: Secondary | ICD-10-CM | POA: Diagnosis not present

## 2017-12-31 DIAGNOSIS — R55 Syncope and collapse: Secondary | ICD-10-CM | POA: Diagnosis not present

## 2017-12-31 LAB — CBC
HEMATOCRIT: 46.6 % — AB (ref 36.0–46.0)
HEMOGLOBIN: 14.5 g/dL (ref 12.0–15.0)
MCH: 25.8 pg — AB (ref 26.0–34.0)
MCHC: 31.1 g/dL (ref 30.0–36.0)
MCV: 82.8 fL (ref 78.0–100.0)
Platelets: 243 10*3/uL (ref 150–400)
RBC: 5.63 MIL/uL — AB (ref 3.87–5.11)
RDW: 15.4 % (ref 11.5–15.5)
WBC: 3.8 10*3/uL — ABNORMAL LOW (ref 4.0–10.5)

## 2017-12-31 LAB — URINALYSIS, ROUTINE W REFLEX MICROSCOPIC
Bilirubin Urine: NEGATIVE
Glucose, UA: NEGATIVE mg/dL
HGB URINE DIPSTICK: NEGATIVE
Ketones, ur: 80 mg/dL — AB
Leukocytes, UA: NEGATIVE
NITRITE: NEGATIVE
PH: 6 (ref 5.0–8.0)
Protein, ur: NEGATIVE mg/dL
SPECIFIC GRAVITY, URINE: 1.023 (ref 1.005–1.030)

## 2017-12-31 LAB — CBG MONITORING, ED
GLUCOSE-CAPILLARY: 88 mg/dL (ref 65–99)
GLUCOSE-CAPILLARY: 83 mg/dL (ref 65–99)

## 2017-12-31 LAB — BASIC METABOLIC PANEL
Anion gap: 10 (ref 5–15)
BUN: 8 mg/dL (ref 6–20)
CALCIUM: 9.3 mg/dL (ref 8.9–10.3)
CO2: 24 mmol/L (ref 22–32)
Chloride: 103 mmol/L (ref 101–111)
Creatinine, Ser: 0.71 mg/dL (ref 0.44–1.00)
GFR calc non Af Amer: >60 mL/min (ref >60)
GLUCOSE: 98 mg/dL (ref 65–99)
POTASSIUM: 4.2 mmol/L (ref 3.5–5.1)
Sodium: 137 mmol/L (ref 135–145)

## 2017-12-31 MED ORDER — FENTANYL CITRATE (PF) 100 MCG/2ML IJ SOLN
100.0000 ug | Freq: Once | INTRAMUSCULAR | Status: AC
  Administered 2017-12-31: 100 ug via INTRAVENOUS
  Filled 2017-12-31: qty 2

## 2017-12-31 MED ORDER — ONDANSETRON 4 MG PO TBDP
8.0000 mg | ORAL_TABLET | Freq: Once | ORAL | Status: DC
  Filled 2017-12-31: qty 2

## 2017-12-31 MED ORDER — ONDANSETRON HCL 4 MG/2ML IJ SOLN
4.0000 mg | Freq: Once | INTRAMUSCULAR | Status: AC
  Administered 2017-12-31: 4 mg via INTRAVENOUS
  Filled 2017-12-31: qty 2

## 2017-12-31 MED ORDER — HYDROCODONE-ACETAMINOPHEN 5-325 MG PO TABS
1.0000 | ORAL_TABLET | Freq: Once | ORAL | Status: DC
  Filled 2017-12-31: qty 1

## 2017-12-31 NOTE — ED Notes (Signed)
Pt's nose started to bleed again out of the right nostril. Pt refusing oral meds and requesting IV meds instead.

## 2017-12-31 NOTE — ED Notes (Signed)
Dr, Sonny Dandy made aware of pt c/o pain.

## 2017-12-31 NOTE — ED Notes (Signed)
VS reported to Dr. Eulis Foster and will hold iv for now.

## 2017-12-31 NOTE — ED Triage Notes (Addendum)
Pt arrives  EMS from home where they were called for nose bleed but while there pt had 2 syncopal episodes lasting less than 15 seconds each. Pt alert and oriented. Pt c/o abdominal pain and headache. Hx of colitis.

## 2017-12-31 NOTE — ED Notes (Signed)
No further nose bleed noted.  Pt to xray.

## 2017-12-31 NOTE — ED Notes (Addendum)
Iv attempted x 1 without success. 

## 2017-12-31 NOTE — ED Notes (Signed)
Pt returns from xray and no nose bleed atr this time

## 2017-12-31 NOTE — ED Notes (Signed)
IV attempted x1 without success

## 2017-12-31 NOTE — ED Notes (Signed)
Pt c/o nose bleed and pt instructed to pinch nose. And hold for 10 minutes by clock. Will inform Dr. Eulis Foster.

## 2017-12-31 NOTE — ED Provider Notes (Signed)
[facility] EMERGENCY DEPARTMENT Provider Note   CSN: [id number] Arrival date & time: 12/31/17  1442     History   Chief Complaint Chief Complaint  Patient presents with  . Loss of Consciousness  . Epistaxis    HPI Erin Wolf is a 62 y.o. female.  Patient presents for evaluation of fever, nosebleed, and syncope.  She began to feel cold yesterday.  Since then she has been anorexic.  Just prior to calling EMS she had sudden onset of nosebleed, right-sided, that resolved after EMS got to her home.  She has mild headache, and no abdominal pain.  She states she has chest pain when she takes a deep breath.  She denies shortness of breath, cough, nausea, vomiting or diarrhea.  There are no other known modifying factors. HPI  Past Medical History:  Diagnosis Date  . Anxiety   . Arthritis   . Chronic abdominal pain   . Chronic lower back pain   . Chronic nausea   . Depression   . Diverticulosis   . Fibromyalgia   . Frequency of urination   . GERD (gastroesophageal reflux disease)   . Hematuria   . Hepatitis C antibody positive in blood    per pt told by pcp  . History of panic attacks   . History of syncope    hx recurrent syncope --- per epic domentation non-cardiac , orthostatic hypotension, anxiety, dehydration  . History of uterine fibroid   . HTN (hypertension), benign   . Hyperlipidemia 01/03/2012  . IBS (irritable bowel syndrome)   . Myalgia   . Seizure disorder ([place]) followed by pcp until new neurologist appr. in jan 2019   "started having them in my 20's" ,  petit mal ----  per pt last seizure one 2015  . SLE (systemic lupus erythematosus) [facility])    rheumatologist-- dr Gerilyn Nestle (consult 11-12-2016) having work-up done  . Ulcerative colitis    followed by dr Penelope Coop at [place]  . Wears glasses     Patient Active Problem List   Diagnosis Date Noted  . Nausea & vomiting 10/18/2016  . History of ulcerative colitis   . LLQ abdominal pain  10/17/2016  . Syncope and collapse 06/29/2016  . Left-sided weakness 06/29/2016  . Facial twitching 06/29/2016  . IBS (irritable bowel syndrome) 06/29/2016  . Faintness   . Dehydration 05/09/2014  . GERD (gastroesophageal reflux disease) 05/09/2014  . Gastroenteritis 05/09/2014  . Chest pain, atypical 05/09/2014  . Chronic ulcerative colitis ([place]) 05/09/2014  . Acute gastroenteritis 05/09/2014  . Ulcerative colitis ([place]) 05/27/2013  . Hypokalemia 05/27/2013  . Anemia 01/04/2012  . Transaminitis 01/04/2012  . Diarrhea 01/04/2012  . Chest pain 01/03/2012  . SOB (shortness of breath) 01/03/2012  . HTN (hypertension), benign 01/03/2012  . Hx of migraines 01/03/2012  . Seizure ([place]) 01/03/2012  . Depression 01/03/2012  . Ulcerative colitis 01/03/2012  . Chronic back pain 01/03/2012  . 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 Hyperlipidemia 01/03/2012  . Hepatitis C 01/03/2012  . Fibroids 01/03/2012    Past Surgical History:  Procedure Laterality Date  . ABDOMINAL HYSTERECTOMY  1988  . CHOLECYSTECTOMY OPEN  1978  . COLONOSCOPY N/A 05/31/2013   Procedure: COLONOSCOPY;  Surgeon: Wonda Horner, MD;  Location: [facility] ENDOSCOPY;  Service: Endoscopy;  Laterality: N/A;  . CYSTOSCOPY/RETROGRADE/URETEROSCOPY Bilateral 07/23/2017   Procedure: CYSTOSCOPY/RETROGRADE/URETEROSCOPY;  Surgeon: Ceasar Mons, MD;  Location[facility];  Service: Urology;  Laterality: Bilateral;  . ESOPHAGOGASTRODUODENOSCOPY (EGD)  WITH PROPOFOL N/A 05/11/2014   Procedure: ESOPHAGOGASTRODUODENOSCOPY (EGD) WITH PROPOFOL;  Surgeon: Lear Ng, MD;  Location: [facility] ENDOSCOPY;  Service: Endoscopy;  Laterality: N/A;  egd first  . EUS N/A 06/21/2016   Procedure: ESOPHAGEAL ENDOSCOPIC ULTRASOUND (EUS) RADIAL;  Surgeon: Arta Silence, MD;  Location: [facility] ENDOSCOPY;  Service: Endoscopy;  Laterality: N/A;  . FLEXIBLE SIGMOIDOSCOPY N/A 05/11/2014   Procedure: FLEXIBLE SIGMOIDOSCOPY;  Surgeon: Lear Ng, MD;  Location: [facility] ENDOSCOPY;  Service: Endoscopy;  Laterality: N/A;  . LAPAROTOMY W/ BILATERAL SALPINGOOPHORECTOMY  09-26-2003   dr Matthew Saras at [facility]  . LAPROSCOPY W/ LYSIS ADHESIONS  06-08-2003   dr Radene Knee [facility]  . TRANSTHORACIC ECHOCARDIOGRAM  04/29/2016   ef 60-65%,  grade 2 diastolic dysfunction/  trivial MR and TR  . TUBAL LIGATION Bilateral yrs ago    OB History    No data available       Home Medications    Prior to Admission medications   Medication Sig Start Date End Date Taking? Authorizing Provider  cyclobenzaprine (FLEXERIL) 10 MG tablet Take 1 tablet by mouth daily as needed for muscle spasms. 10/31/17  Yes [provider]  diazepam (VALIUM) 5 MG tablet Take 5 mg by mouth 2 (two) times daily as needed for anxiety. 05/07/17  Yes [provider]  FLUoxetine (PROZAC) 20 MG capsule Take 20 mg by mouth every morning.   Yes [provider]  furosemide (LASIX) 20 MG tablet Take 20 mg by mouth every morning.   Yes [provider]  levETIRAcetam (KEPPRA XR) 500 MG 24 hr tablet Take 1-2 tablets (500-1,000 mg total) by mouth 2 (two) times daily. Take 1 tablet in the mornings and 2 tabs at bedtime. Patient taking differently: Take 500-1,000 mg by mouth See admin instructions. Take 500 mg in the mornings and1000 mg at bedtime. 07/01/16  Yes Hongalgi, Lenis Dickinson, MD  mesalamine (ROWASA) 4 g enema Place 4 g rectally as needed (UC).    Yes [provider]  metoCLOPramide (REGLAN) 10 MG tablet Take 1 tablet (10 mg total) by mouth every 6 (six) hours. Patient taking differently: Take 10 mg by mouth every 6 (six) hours as needed for nausea or vomiting.  04/10/17  Yes Charlesetta Shanks, MD  ondansetron (ZOFRAN ODT) 8 MG disintegrating tablet Take 1 tablet (8 mg total) by mouth every 8 (eight) hours as needed for nausea or vomiting. 06/06/17  Yes Jola Schmidt, MD  Oxycodone HCl 10 MG TABS Take 10 mg by mouth 3 (three) times daily as needed for pain. 05/21/17   Yes [provider]  cycloSPORINE (RESTASIS) 0.05 % ophthalmic emulsion Place 1 drop into both eyes 2 (two) times daily as needed for dry eyes.    [provider]  levofloxacin (LEVAQUIN) 250 MG tablet Take 1 tablet (250 mg total) by mouth daily. Patient not taking: Reported on 08/01/2017 07/23/17   Ceasar Mons, MD  phenazopyridine (PYRIDIUM) 200 MG tablet Take 1 tablet (200 mg total) by mouth 3 (three) times daily. Patient not taking: Reported on 12/31/2017 08/01/17   Maczis, Barth Kirks, PA-C  sulfamethoxazole-trimethoprim (BACTRIM DS,SEPTRA DS) 800-160 MG tablet Take 1 tablet by mouth 2 (two) times daily. Patient not taking: Reported on 12/31/2017 08/01/17   Maczis, Barth Kirks, PA-C    Family History Family History  Problem Relation Age of Onset  . Colon cancer Brother     Social History Social History   Tobacco Use  . Smoking status: Never Smoker  . Smokeless  tobacco: Never Used  Substance Use Topics  . Alcohol use: Yes    Comment: very rare  . Drug use: No     Allergies   Latex; Nsaids; Penicillins; Wellbutrin [bupropion hcl]; Aspirin; Darvocet [propoxyphene n-acetaminophen]; Isometheptene-dichloral-apap; Tape; Toradol [ketorolac tromethamine]; Tylenol [acetaminophen]; Milk-related compounds; Other; and Dilaudid [hydromorphone hcl]   Review of Systems Review of Systems  All other systems reviewed and are negative.    Physical Exam Updated Vital Signs BP 111/73   Pulse (!) 101   Resp 18   Ht 5\' 5"  (1.651 m)   Wt 102.1 kg (225 lb)   SpO2 92%   BMI 37.44 kg/m   Physical Exam  Constitutional: She is oriented to person, place, and time. She appears well-developed and well-nourished.  HENT:  Head: Normocephalic and atraumatic.  No blood or clots in nares.  Oropharynx normal without bleeding or deformity.  Eyes: Conjunctivae and EOM are normal. Pupils are equal, round, and reactive to light.  Neck: Normal range of motion and phonation  normal. Neck supple.  Cardiovascular: Normal rate and regular rhythm.  Pulmonary/Chest: Effort normal and breath sounds normal. No respiratory distress. She has no wheezes. She has no rales. She exhibits no tenderness.  Abdominal: Soft. She exhibits no distension. There is tenderness (Mild diffuse lower abdominal tenderness, bilaterally). There is no rebound and no guarding.  Musculoskeletal: Normal range of motion.  Neurological: She is alert and oriented to person, place, and time. She exhibits normal muscle tone.  Skin: Skin is warm and dry.  Psychiatric: She has a normal mood and affect. Her behavior is normal. Judgment and thought content normal.  Nursing note and vitals reviewed.    ED Treatments / Results  Labs (all labs ordered are listed, but only abnormal results are displayed) Labs Reviewed  CBC - Abnormal; Notable for the following components:      Result Value   WBC 3.8 (*)    RBC 5.63 (*)    HCT 46.6 (*)    MCH 25.8 (*)    All other components within normal limits  URINALYSIS, ROUTINE W REFLEX MICROSCOPIC - Abnormal; Notable for the following components:   APPearance CLOUDY (*)    Ketones, ur 80 (*)    All other components within normal limits  BASIC METABOLIC PANEL  CBG MONITORING, ED  CBG MONITORING, ED    EKG  EKG Interpretation  Date/Time:  Wednesday December 31 2017 14:42:54 EDT Ventricular Rate:  84 PR Interval:    QRS Duration: 81 QT Interval:  344 QTC Calculation: 407 R Axis:   37 Text Interpretation:  Sinus rhythm Ventricular premature complex Low voltage, precordial leads Since last tracing PVC is new Confirmed by Daleen Bo [phone]) on 12/31/2017 7:29:10 PM       Radiology Dg Chest 2 View  Result Date: 12/31/2017 CLINICAL DATA:  Syncope. EXAM: CHEST - 2 VIEW COMPARISON:  04/08/2017. FINDINGS: The heart size and mediastinal contours are within normal limits. Both lungs are clear. The visualized skeletal structures are unremarkable. Chronic  RIGHT hemidiaphragm elevation. IMPRESSION: No active cardiopulmonary disease.  Stable appearance from priors. Electronically Signed   By: Staci Righter M.D.   On: 12/31/2017 17:34    Procedures Procedures (including critical care time)  Medications Ordered in ED Medications  oxymetazoline (AFRIN) 0.05 % nasal spray 2 spray (not administered)  fentaNYL (SUBLIMAZE) injection 100 mcg (100 mcg Intravenous Given 12/31/17 2321)  ondansetron (ZOFRAN) injection 4 mg (4 mg Intravenous Given 12/31/17 2319)  Initial Impression / Assessment and Plan / ED Course  I have reviewed the triage vital signs and the nursing notes.  Pertinent labs & imaging results that were available during my care of the patient were reviewed by me and considered in my medical decision making (see chart for details).  Clinical Course as of Jan 02 32  Wed Dec 31, 2017  1929 Pending labs to evaluate epistaxis and syncope, are reassuring.  Basic metabolic panel is normal.  CBC has white count slightly low at 3.8 hemoglobin normal at 14.5.   [EW]  1930 EKG is reassuring.  QTC is normal.  Solitary PVC is not indicative of cardiac instability. ED EKG [EW]    Clinical Course User Index [EW] Daleen Bo, MD     Patient Vitals for the past 24 hrs:  BP Pulse Resp SpO2 Height Weight  01/01/18 0015 111/73 (!) 101 18 92 % - -  01/01/18 0000 102/66 (!) 108 15 (!) 87 % - -  12/31/17 2345 112/82 (!) 113 (!) 21 94 % - -  12/31/17 2330 129/88 (!) 125 (!) 22 91 % - -  12/31/17 2315 119/90 (!) 121 16 98 % - -  12/31/17 2300 136/77 (!) 119 16 96 % - -  12/31/17 2245 119/89 (!) 109 15 93 % - -  12/31/17 2230 (!) 133/98 (!) 115 14 98 % - -  12/31/17 2215 (!) 132/53 (!) 117 (!) 22 98 % - -  12/31/17 2130 134/70 (!) 111 (!) 21 100 % - -  12/31/17 2115 122/86 96 17 100 % - -  12/31/17 2100 115/80 91 20 99 % - -  12/31/17 2030 (!) 118/91 87 19 97 % - -  12/31/17 2015 118/79 93 14 97 % - -  12/31/17 2000 122/80 91 18 100 % - -    12/31/17 1915 123/76 98 18 100 % - -  12/31/17 1845 110/82 89 12 99 % - -  12/31/17 1830 125/74 87 10 98 % - -  12/31/17 1815 (!) 135/108 98 14 100 % - -  12/31/17 1800 126/81 92 10 97 % - -  12/31/17 1745 122/74 87 17 100 % - -  12/31/17 1730 (!) 122/109 82 16 100 % - -  12/31/17 1715 115/81 91 19 99 % - -  12/31/17 1700 116/81 86 (!) 21 100 % - -  12/31/17 1645 132/88 94 13 100 % - -  12/31/17 1630 107/90 97 (!) 28 100 % - -  12/31/17 1615 (!) 119/105 91 15 100 % - -  12/31/17 1600 124/84 83 12 97 % - -  12/31/17 1545 118/77 84 11 98 % - -  12/31/17 1530 127/83 90 14 100 % - -  12/31/17 1515 122/80 89 (!) 9 99 % - -  12/31/17 1500 133/81 88 13 98 % - -  12/31/17 1452 - - - - 5\' 5"  (1.651 m) 102.1 kg (225 lb)  12/31/17 1451 (!) 123/101 90 12 99 % - -  12/31/17 1445 (!) 123/103 87 17 99 % - -  12/31/17 1424 - - - 99 % - -    9:14 PM Reevaluation with update and discussion. After initial assessment and treatment, an updated evaluation reveals she complains of headache and lower abdominal pain at this time.  She would like to try drinking and eating.  Zofran and Norco ordered, will follow this with p.o. challenge. Daleen Bo   00: 05 AM-she was offered nasal  packing with [employer] for recurrent bleeding, but declined it.  She states she went to go to see a your nose and throat specialist first.  At this time bleeding has again stopped after using pressure on the nose.  Findings discussed with patient and husband, all questions answered   Final Clinical Impressions(s) / ED Diagnoses   Final diagnoses:  Nosebleed  Abdominal pain, unspecified abdominal location   Nosebleed, controlled with pressure.  Screening evaluation indicates normal CBC.  Nonspecific headache and lower abdominal pain.  Doubt significant colitis flare, meningitis, intracranial bleeding, metabolic instability or impending vascular collapse.  Nursing Notes Reviewed/ Care Coordinated Applicable Imaging  Reviewed Interpretation of Laboratory Data incorporated into ED treatment  The patient appears reasonably screened and/or stabilized for discharge and I doubt any other medical condition or other [facility] requiring further screening, evaluation, or treatment in the ED at this time prior to discharge.  Plan: Home Medications-Afrin nasal spray both nares twice daily x2 or 3 days.  Continue current medications; Home Treatments-rest, fluids; return here if the recommended treatment, does not improve the symptoms; Recommended follow up-ENT follow-up as soon as possible.       ED Discharge Orders    None       Daleen Bo, MD 01/01/18 [phone]

## 2018-01-01 MED ORDER — OXYMETAZOLINE HCL 0.05 % NA SOLN
2.0000 | Freq: Once | NASAL | Status: AC
  Administered 2018-01-01: 2 via NASAL
  Filled 2018-01-01: qty 15

## 2018-01-01 NOTE — Discharge Instructions (Signed)
Since you have chosen to avoid packing the nose at this time, you will likely have more nosebleeds.  To treat nosebleeds, pinch the tip of your nose firmly for 15-20 minutes to help control the bleeding.  You can also try using an ice pack on the nose to help slow the bleeding.  Use the Afrin nasal spray 1-2 puffs in each nostril twice a day, for up to 3 days.  Call the ENT doctor for an appointment to be seen for evaluation of the nose bleeding as soon as possible.

## 2018-03-03 DIAGNOSIS — H04123 Dry eye syndrome of bilateral lacrimal glands: Secondary | ICD-10-CM | POA: Diagnosis not present

## 2018-03-03 DIAGNOSIS — H40013 Open angle with borderline findings, low risk, bilateral: Secondary | ICD-10-CM | POA: Diagnosis not present

## 2018-03-03 DIAGNOSIS — E119 Type 2 diabetes mellitus without complications: Secondary | ICD-10-CM | POA: Diagnosis not present

## 2018-03-03 DIAGNOSIS — H43812 Vitreous degeneration, left eye: Secondary | ICD-10-CM | POA: Diagnosis not present

## 2018-03-03 DIAGNOSIS — H20012 Primary iridocyclitis, left eye: Secondary | ICD-10-CM | POA: Diagnosis not present

## 2018-03-06 ENCOUNTER — Other Ambulatory Visit: Payer: Self-pay | Admitting: Family Medicine

## 2018-03-06 ENCOUNTER — Ambulatory Visit
Admission: RE | Admit: 2018-03-06 | Discharge: 2018-03-06 | Disposition: A | Discharge status: Home / Self Care | Payer: Medicare Other | Source: Ambulatory Visit | Attending: Family Medicine | Admitting: Family Medicine

## 2018-03-06 DIAGNOSIS — G44319 Acute post-traumatic headache, not intractable: Secondary | ICD-10-CM | POA: Diagnosis not present

## 2018-03-06 DIAGNOSIS — H40013 Open angle with borderline findings, low risk, bilateral: Secondary | ICD-10-CM | POA: Diagnosis not present

## 2018-03-06 DIAGNOSIS — H2513 Age-related nuclear cataract, bilateral: Secondary | ICD-10-CM | POA: Diagnosis not present

## 2018-03-06 DIAGNOSIS — H20012 Primary iridocyclitis, left eye: Secondary | ICD-10-CM | POA: Diagnosis not present

## 2018-03-06 DIAGNOSIS — R51 Headache: Secondary | ICD-10-CM | POA: Diagnosis not present

## 2018-03-09 DIAGNOSIS — K5190 Ulcerative colitis, unspecified, without complications: Secondary | ICD-10-CM | POA: Diagnosis not present

## 2018-03-09 DIAGNOSIS — G40909 Epilepsy, unspecified, not intractable, without status epilepticus: Secondary | ICD-10-CM | POA: Diagnosis not present

## 2018-03-09 DIAGNOSIS — E119 Type 2 diabetes mellitus without complications: Secondary | ICD-10-CM | POA: Diagnosis not present

## 2018-03-09 DIAGNOSIS — F324 Major depressive disorder, single episode, in partial remission: Secondary | ICD-10-CM | POA: Diagnosis not present

## 2018-03-09 DIAGNOSIS — F419 Anxiety disorder, unspecified: Secondary | ICD-10-CM | POA: Diagnosis not present

## 2018-03-09 DIAGNOSIS — E7800 Pure hypercholesterolemia, unspecified: Secondary | ICD-10-CM | POA: Diagnosis not present

## 2018-03-09 DIAGNOSIS — G894 Chronic pain syndrome: Secondary | ICD-10-CM | POA: Diagnosis not present

## 2018-03-09 DIAGNOSIS — R600 Localized edema: Secondary | ICD-10-CM | POA: Diagnosis not present

## 2018-03-09 DIAGNOSIS — G43001 Migraine without aura, not intractable, with status migrainosus: Secondary | ICD-10-CM | POA: Diagnosis not present

## 2018-03-09 DIAGNOSIS — I10 Essential (primary) hypertension: Secondary | ICD-10-CM | POA: Diagnosis not present

## 2018-05-13 ENCOUNTER — Ambulatory Visit: Payer: Medicare Other | Admitting: Neurology

## 2018-07-09 ENCOUNTER — Ambulatory Visit: Payer: Medicare Other | Admitting: Neurology

## 2018-07-09 ENCOUNTER — Encounter

## 2018-08-10 DIAGNOSIS — F419 Anxiety disorder, unspecified: Secondary | ICD-10-CM | POA: Diagnosis not present

## 2018-08-10 DIAGNOSIS — R569 Unspecified convulsions: Secondary | ICD-10-CM | POA: Diagnosis not present

## 2018-08-10 DIAGNOSIS — K5190 Ulcerative colitis, unspecified, without complications: Secondary | ICD-10-CM | POA: Diagnosis not present

## 2018-08-10 DIAGNOSIS — E7800 Pure hypercholesterolemia, unspecified: Secondary | ICD-10-CM | POA: Diagnosis not present

## 2018-08-10 DIAGNOSIS — M545 Low back pain: Secondary | ICD-10-CM | POA: Diagnosis not present

## 2018-08-10 DIAGNOSIS — F324 Major depressive disorder, single episode, in partial remission: Secondary | ICD-10-CM | POA: Diagnosis not present

## 2018-08-10 DIAGNOSIS — E119 Type 2 diabetes mellitus without complications: Secondary | ICD-10-CM | POA: Diagnosis not present

## 2018-08-10 DIAGNOSIS — I10 Essential (primary) hypertension: Secondary | ICD-10-CM | POA: Diagnosis not present

## 2018-08-10 DIAGNOSIS — G43001 Migraine without aura, not intractable, with status migrainosus: Secondary | ICD-10-CM | POA: Diagnosis not present

## 2018-08-10 DIAGNOSIS — G8929 Other chronic pain: Secondary | ICD-10-CM | POA: Diagnosis not present

## 2018-09-08 DIAGNOSIS — R51 Headache: Secondary | ICD-10-CM | POA: Diagnosis not present

## 2018-09-08 DIAGNOSIS — H40013 Open angle with borderline findings, low risk, bilateral: Secondary | ICD-10-CM | POA: Diagnosis not present

## 2018-09-08 DIAGNOSIS — H04123 Dry eye syndrome of bilateral lacrimal glands: Secondary | ICD-10-CM | POA: Diagnosis not present

## 2018-09-08 DIAGNOSIS — H2513 Age-related nuclear cataract, bilateral: Secondary | ICD-10-CM | POA: Diagnosis not present

## 2018-09-14 ENCOUNTER — Other Ambulatory Visit: Payer: Self-pay | Admitting: Family Medicine

## 2018-09-14 DIAGNOSIS — H35463 Secondary vitreoretinal degeneration, bilateral: Secondary | ICD-10-CM | POA: Diagnosis not present

## 2018-09-14 DIAGNOSIS — H43813 Vitreous degeneration, bilateral: Secondary | ICD-10-CM | POA: Diagnosis not present

## 2018-09-14 DIAGNOSIS — H43393 Other vitreous opacities, bilateral: Secondary | ICD-10-CM | POA: Diagnosis not present

## 2018-09-14 DIAGNOSIS — H2513 Age-related nuclear cataract, bilateral: Secondary | ICD-10-CM | POA: Diagnosis not present

## 2018-09-14 DIAGNOSIS — G4489 Other headache syndrome: Secondary | ICD-10-CM

## 2018-09-14 DIAGNOSIS — H532 Diplopia: Secondary | ICD-10-CM

## 2018-09-29 DIAGNOSIS — R350 Frequency of micturition: Secondary | ICD-10-CM | POA: Diagnosis not present

## 2018-09-29 DIAGNOSIS — Z6838 Body mass index (BMI) 38.0-38.9, adult: Secondary | ICD-10-CM | POA: Diagnosis not present

## 2018-09-29 DIAGNOSIS — R1032 Left lower quadrant pain: Secondary | ICD-10-CM | POA: Diagnosis not present

## 2018-09-29 DIAGNOSIS — Z01419 Encounter for gynecological examination (general) (routine) without abnormal findings: Secondary | ICD-10-CM | POA: Diagnosis not present

## 2018-09-30 ENCOUNTER — Ambulatory Visit
Admission: RE | Admit: 2018-09-30 | Discharge: 2018-09-30 | Disposition: A | Discharge status: Home / Self Care | Payer: Medicare Other | Source: Ambulatory Visit | Attending: Family Medicine | Admitting: Family Medicine

## 2018-09-30 DIAGNOSIS — H539 Unspecified visual disturbance: Secondary | ICD-10-CM | POA: Diagnosis not present

## 2018-09-30 DIAGNOSIS — G4489 Other headache syndrome: Secondary | ICD-10-CM

## 2018-09-30 DIAGNOSIS — H532 Diplopia: Secondary | ICD-10-CM

## 2018-09-30 MED ORDER — GADOBENATE DIMEGLUMINE 529 MG/ML IV SOLN
20.0000 mL | Freq: Once | INTRAVENOUS | Status: AC | PRN
  Administered 2018-09-30: 20 mL via INTRAVENOUS

## 2018-10-09 DIAGNOSIS — Z1231 Encounter for screening mammogram for malignant neoplasm of breast: Secondary | ICD-10-CM | POA: Diagnosis not present

## 2018-11-10 ENCOUNTER — Encounter: Payer: Self-pay | Admitting: Neurology

## 2018-11-10 ENCOUNTER — Ambulatory Visit: Payer: Medicare HMO | Admitting: Neurology

## 2018-11-10 VITALS — BP 126/77 | HR 73 | Ht 65.0 in | Wt 228.0 lb

## 2018-11-10 DIAGNOSIS — G43709 Chronic migraine without aura, not intractable, without status migrainosus: Secondary | ICD-10-CM | POA: Insufficient documentation

## 2018-11-10 DIAGNOSIS — IMO0002 Reserved for concepts with insufficient information to code with codable children: Secondary | ICD-10-CM | POA: Insufficient documentation

## 2018-11-10 MED ORDER — ONDANSETRON 4 MG PO TBDP: 4.0000 mg | ORAL_TABLET | Freq: Three times a day (TID) | ORAL | 6 refills | Status: DC | PRN

## 2018-11-10 MED ORDER — RIZATRIPTAN BENZOATE 5 MG PO TBDP: 5.0000 mg | ORAL_TABLET | ORAL | 6 refills | Status: DC | PRN

## 2018-11-10 MED ORDER — RIZATRIPTAN BENZOATE 10 MG PO TBDP: 10.0000 mg | ORAL_TABLET | ORAL | 6 refills | Status: DC | PRN

## 2018-11-10 NOTE — Progress Notes (Signed)
PATIENT: Erin Wolf DOB: 1956/10/10  Chief Complaint  Patient presents with  . Headache    Recent normal MRI of brain and orbits.  Reports 1-2 severe headaches per month.  She tends to have nausea, light sensitivity, fatigue and muscle weakness with her headaches.  She uses oxycodone or ibuprofen depending on the level of her pain.  She also has insomnia, fatigue, RLS, seizure disorder, depression.and anxiety.  Marland Kitchen PCP    Shirline Frees, MD     HISTORICAL  Erin Wolf is a 63 year old female, seen in request by her primary care physician Dr. Kenton Kingfisher, Gwyndolyn Saxon for evaluation of headaches, initial evaluation was on November 10, 2018.  I have reviewed and summarized the referring note from the referring physician.  She has past medical history of ulcerative colitis, hyperlipidemia, low back pain, hypertension, type 2 diabetes, chronic migraine headaches, anxiety, major depression,  She complains of headaches since age 65, intermittent, last month she had 2-3 headaches, some months that she would have no headaches, her typical headache bilateral frontal temporal region severe pounding headache, with associated light, noise sensitivity, nauseous, lasting half to 1 day, Advil only provide partial help, she did have a history of ulcerative colitis, sometimes rely on oxycodone as needed,  She also reported history of seizure, last seizure was in 2017, is taking Keppra XR 500 every night for seizure control,  She also has history of depression anxiety, complains of insomnia, muscle spasm, fatigue  Laboratory evaluations on August 10, 2018, elevated total cholesterol 258, LDL 159, A1c was 6.6, normal TSH 1.6, CMP, CBC hemoglobin 13,  I personally reviewed MRI of the brain December 2019 that was normal  REVIEW OF SYSTEMS: Full 14 system review of systems performed and notable only for fever, chills, weight loss, fatigue, palpitation, sweating legs, ringing in ears, blurred vision,  shortness of breath, diarrhea, incontinence, blood in urine, easy bruising, feeling hot, cold, joint pain, swelling, cramps, aching muscles, runny nose, skin sensitivity, memory loss, headaches, weakness, dizziness, tremor, insomnia, sleepiness, snoring, restless leg, depression, anxiety not enough sleep, too much sleep, decreased energy, change in appetite, disinterested in activities  All other review of systems were negative.  ALLERGIES: Allergies  Allergen Reactions  . Latex Other (See Comments)    Peels skin  . Nsaids Other (See Comments)    HAS COLITIS FLARES  . Penicillins Hives    Has patient had a PCN reaction causing immediate rash, facial/tongue/throat swelling, SOB or lightheadedness with hypotension: Yes Has patient had a PCN reaction causing severe rash involving mucus membranes or skin necrosis: Unknown Has patient had a PCN reaction that required hospitalization: no Has patient had a PCN reaction occurring within the last 10 years: No If all of the above answers are "NO", then may proceed with Cephalosporin use.   . Wellbutrin [Bupropion Hcl] Other (See Comments)    Insomnia and headaches  . Aspirin Nausea Only  . Darvocet [Propoxyphene N-Acetaminophen] Nausea And Vomiting  . Isometheptene-Dichloral-Apap Nausea Only    IV dye?  Marland Kitchen Tape Hives  . Toradol [Ketorolac Tromethamine] Nausea And Vomiting  . Tylenol [Acetaminophen] Nausea Only  . Milk-Related Compounds     Can cause diarrhea  . Other     Spicy foods cause diarrhea due to colitis  . Dilaudid [Hydromorphone Hcl] Itching, Nausea Only and Rash    HOME MEDICATIONS: Current Outpatient Medications  Medication Sig Dispense Refill  . cyclobenzaprine (FLEXERIL) 10 MG tablet Take 1 tablet by mouth daily as  needed for muscle spasms.    . cycloSPORINE (RESTASIS) 0.05 % ophthalmic emulsion Place 1 drop into both eyes 2 (two) times daily as needed for dry eyes.    . diazepam (VALIUM) 5 MG tablet Take 5 mg by mouth 2  (two) times daily as needed for anxiety.    Marland Kitchen FLUoxetine (PROZAC) 20 MG capsule Take 20 mg by mouth every morning.    . furosemide (LASIX) 20 MG tablet Take 20 mg by mouth every morning.    . levETIRAcetam (KEPPRA XR) 500 MG 24 hr tablet Take 1-2 tablets (500-1,000 mg total) by mouth 2 (two) times daily. Take 1 tablet in the mornings and 2 tabs at bedtime. (Patient taking differently: Take 500-1,000 mg by mouth See admin instructions. Take 500 mg in the mornings and1000 mg at bedtime.) 60 tablet 0  . mesalamine (ROWASA) 4 g enema Place 4 g rectally as needed (UC).     . metoCLOPramide (REGLAN) 10 MG tablet Take 1 tablet (10 mg total) by mouth every 6 (six) hours. (Patient taking differently: Take 10 mg by mouth every 6 (six) hours as needed for nausea or vomiting. ) 30 tablet 0  . ondansetron (ZOFRAN ODT) 8 MG disintegrating tablet Take 1 tablet (8 mg total) by mouth every 8 (eight) hours as needed for nausea or vomiting. 10 tablet 0  . Oxycodone HCl 10 MG TABS Take 10 mg by mouth 3 (three) times daily as needed for pain.     No current facility-administered medications for this visit.     PAST MEDICAL HISTORY: Past Medical History:  Diagnosis Date  . Anxiety   . Arthritis   . Chronic abdominal pain   . Chronic lower back pain   . Chronic nausea   . Depression   . Diverticulosis   . Fibromyalgia   . Frequency of urination   . GERD (gastroesophageal reflux disease)   . Headache   . Hematuria   . Hepatitis C antibody positive in blood    per pt told by pcp  . History of panic attacks   . History of syncope    hx recurrent syncope --- per epic domentation non-cardiac , orthostatic hypotension, anxiety, dehydration  . History of uterine fibroid   . HTN (hypertension), benign   . Hyperlipidemia 01/03/2012  . IBS (irritable bowel syndrome)   . Myalgia   . Seizure disorder ([place]) followed by pcp until new neurologist appr. in jan 2019   "started having them in my 20's" ,  petit mal ----   per pt last seizure one 2015  . SLE (systemic lupus erythematosus) [facility])    rheumatologist-- dr Gerilyn Nestle (consult 11-12-2016) having work-up done  . Ulcerative colitis    followed by dr Penelope Coop at [facility]  . Wears glasses     PAST SURGICAL HISTORY: Past Surgical History:  Procedure Laterality Date  . ABDOMINAL HYSTERECTOMY  1988  . CHOLECYSTECTOMY OPEN  1978  . COLONOSCOPY N/A 05/31/2013   Procedure: COLONOSCOPY;  Surgeon: Wonda Horner, MD;  Location: [facility] ENDOSCOPY;  Service: Endoscopy;  Laterality: N/A;  . CYSTOSCOPY/RETROGRADE/URETEROSCOPY Bilateral 07/23/2017   Procedure: CYSTOSCOPY/RETROGRADE/URETEROSCOPY;  Surgeon: Ceasar Mons, MD;  Location[facility];  Service: Urology;  Laterality: Bilateral;  . ESOPHAGOGASTRODUODENOSCOPY (EGD) WITH PROPOFOL N/A 05/11/2014   Procedure: ESOPHAGOGASTRODUODENOSCOPY (EGD) WITH PROPOFOL;  Surgeon: Lear Ng, MD;  Location: [facility] ENDOSCOPY;  Service: Endoscopy;  Laterality: N/A;  egd first  . EUS N/A 06/21/2016   Procedure: ESOPHAGEAL ENDOSCOPIC ULTRASOUND (  EUS) RADIAL;  Surgeon: Arta Silence, MD;  Location: [facility] ENDOSCOPY;  Service: Endoscopy;  Laterality: N/A;  . FLEXIBLE SIGMOIDOSCOPY N/A 05/11/2014   Procedure: FLEXIBLE SIGMOIDOSCOPY;  Surgeon: Lear Ng, MD;  Location: [facility] ENDOSCOPY;  Service: Endoscopy;  Laterality: N/A;  . LAPAROTOMY W/ BILATERAL SALPINGOOPHORECTOMY  09-26-2003   dr Matthew Saras at [facility]  . LAPROSCOPY W/ LYSIS ADHESIONS  06-08-2003   dr Radene Knee [facility]  . TRANSTHORACIC ECHOCARDIOGRAM  04/29/2016   ef 60-65%,  grade 2 diastolic dysfunction/  trivial MR and TR  . TUBAL LIGATION Bilateral yrs ago    FAMILY HISTORY: Family History  Problem Relation Age of Onset  . Alzheimer's disease Mother   . Hypertension Mother   . Colon cancer Brother   . Hypertension Brother   . Other Father        ruptured appendix  . Breast cancer Sister   . Hypertension Sister   . Hypertension Sister   . Memory loss  Brother   . Hypertension Sister   . Heart disease Sister     SOCIAL HISTORY: Social History   Socioeconomic History  . Marital status: Married    Spouse name: Not on file  . Number of children: 1  . Years of education: 2 years college  . Highest education level: Not on file  Occupational History  . Occupation: Retired  Scientific laboratory technician  . Financial resource strain: Not on file  . Food insecurity:    Worry: Not on file    Inability: Not on file  . Transportation needs:    Medical: Not on file    Non-medical: Not on file  Tobacco Use  . Smoking status: Never Smoker  . Smokeless tobacco: Never Used  Substance and Sexual Activity  . Alcohol use: Yes    Comment: very rare - special occasions  . Drug use: No  . Sexual activity: Yes  Lifestyle  . Physical activity:    Days per week: Not on file    Minutes per session: Not on file  . Stress: Not on file  Relationships  . Social connections:    Talks on phone: Not on file    Gets together: Not on file    Attends religious service: Not on file    Active member of club or organization: Not on file    Attends meetings of clubs or organizations: Not on file    Relationship status: Not on file  . Intimate partner violence:    Fear of current or ex partner: Not on file    Emotionally abused: Not on file    Physically abused: Not on file    Forced sexual activity: Not on file  Other Topics Concern  . Not on file  Social History Narrative   Lives at home with husband.   Right-handed.   No caffeine use.     PHYSICAL EXAM   Vitals:   11/10/18 0948  BP: 126/77  Pulse: 73  Weight: 228 lb (103.4 kg)  Height: 5\' 5"  (1.651 m)    Not recorded      Body mass index is 37.94 kg/m.  PHYSICAL EXAMNIATION:  Gen: NAD, conversant, well nourised, obese, well groomed                     Cardiovascular: Regular rate rhythm, no peripheral edema, warm, nontender. Eyes: Conjunctivae clear without exudates or hemorrhage Neck:  Supple, no carotid bruits. Pulmonary: Clear to auscultation bilaterally   NEUROLOGICAL EXAM:  MENTAL  STATUS: Speech:    Speech is normal; fluent and spontaneous with normal comprehension.  Cognition:     Orientation to time, place and person     Normal recent and remote memory     Normal Attention span and concentration     Normal Language, naming, repeating,spontaneous speech     Fund of knowledge   CRANIAL NERVES: CN II: Visual fields are full to confrontation. Fundoscopic exam is normal with sharp discs and no vascular changes. Pupils are round equal and briskly reactive to light. CN III, IV, VI: extraocular movement are normal. No ptosis. CN V: Facial sensation is intact to pinprick in all 3 divisions bilaterally. Corneal responses are intact.  CN VII: Face is symmetric with normal eye closure and smile. CN VIII: Hearing is normal to rubbing fingers CN IX, X: Palate elevates symmetrically. Phonation is normal. CN XI: Head turning and shoulder shrug are intact CN XII: Tongue is midline with normal movements and no atrophy.  MOTOR: There is no pronator drift of out-stretched arms. Muscle bulk and tone are normal. Muscle strength is normal.  REFLEXES: Reflexes are 2+ and symmetric at the biceps, triceps, knees, and ankles. Plantar responses are flexor.  SENSORY: Intact to light touch, pinprick, positional sensation and vibratory sensation are intact in fingers and toes.  COORDINATION: Rapid alternating movements and fine finger movements are intact. There is no dysmetria on finger-to-nose and heel-knee-shin.    GAIT/STANCE: Posture is normal. Gait is steady with normal steps, base, arm swing, and turning. Heel and toe walking are normal. Tandem gait is normal.  Romberg is absent.   DIAGNOSTIC DATA (LABS, IMAGING, TESTING) - I reviewed patient records, labs, notes, testing and imaging myself where available.   ASSESSMENT AND PLAN  Erin Wolf is a 63 y.o. female     Chronic migraine headaches  Maxalt 5 mg as needed  It combine it together with Zofran as needed     Marcial Pacas, M.D. Ph.D.  [facility] [address] [place], [place] [address] Ph: [phone] Fax: [phone]  [id number], Gwyndolyn Saxon, MD

## 2018-11-17 ENCOUNTER — Emergency Department (HOSPITAL_COMMUNITY)
Admission: EM | Admit: 2018-11-17 | Discharge: 2018-11-17 | Disposition: A | Discharge status: Home / Self Care | Payer: Medicare HMO | Attending: Emergency Medicine | Admitting: Emergency Medicine

## 2018-11-17 ENCOUNTER — Emergency Department (HOSPITAL_COMMUNITY): Payer: Medicare HMO

## 2018-11-17 ENCOUNTER — Other Ambulatory Visit: Payer: Self-pay

## 2018-11-17 DIAGNOSIS — I10 Essential (primary) hypertension: Secondary | ICD-10-CM | POA: Diagnosis not present

## 2018-11-17 DIAGNOSIS — M7989 Other specified soft tissue disorders: Secondary | ICD-10-CM | POA: Diagnosis not present

## 2018-11-17 DIAGNOSIS — R58 Hemorrhage, not elsewhere classified: Secondary | ICD-10-CM | POA: Diagnosis not present

## 2018-11-17 DIAGNOSIS — I491 Atrial premature depolarization: Secondary | ICD-10-CM | POA: Diagnosis not present

## 2018-11-17 DIAGNOSIS — T7840XA Allergy, unspecified, initial encounter: Secondary | ICD-10-CM | POA: Insufficient documentation

## 2018-11-17 DIAGNOSIS — L299 Pruritus, unspecified: Secondary | ICD-10-CM | POA: Diagnosis not present

## 2018-11-17 DIAGNOSIS — R0689 Other abnormalities of breathing: Secondary | ICD-10-CM | POA: Diagnosis not present

## 2018-11-17 DIAGNOSIS — R404 Transient alteration of awareness: Secondary | ICD-10-CM | POA: Diagnosis not present

## 2018-11-17 DIAGNOSIS — R21 Rash and other nonspecific skin eruption: Secondary | ICD-10-CM | POA: Diagnosis present

## 2018-11-17 DIAGNOSIS — E876 Hypokalemia: Secondary | ICD-10-CM | POA: Diagnosis not present

## 2018-11-17 DIAGNOSIS — Z79899 Other long term (current) drug therapy: Secondary | ICD-10-CM | POA: Diagnosis not present

## 2018-11-17 LAB — CBC WITH DIFFERENTIAL/PLATELET
Abs Immature Granulocytes: 0.01 10*3/uL (ref 0.00–0.07)
Basophils Absolute: 0 10*3/uL (ref 0.0–0.1)
Basophils Relative: 0 %
Eosinophils Absolute: 0 10*3/uL (ref 0.0–0.5)
Eosinophils Relative: 0 %
HEMATOCRIT: 39.1 % (ref 36.0–46.0)
HEMOGLOBIN: 11.5 g/dL — AB (ref 12.0–15.0)
Immature Granulocytes: 0 %
LYMPHS ABS: 2 10*3/uL (ref 0.7–4.0)
Lymphocytes Relative: 52 %
MCH: 25.7 pg — ABNORMAL LOW (ref 26.0–34.0)
MCHC: 29.4 g/dL — ABNORMAL LOW (ref 30.0–36.0)
MCV: 87.3 fL (ref 80.0–100.0)
MONOS PCT: 11 %
Monocytes Absolute: 0.4 10*3/uL (ref 0.1–1.0)
Neutro Abs: 1.5 10*3/uL — ABNORMAL LOW (ref 1.7–7.7)
Neutrophils Relative %: 37 %
Platelets: 204 10*3/uL (ref 150–400)
RBC: 4.48 MIL/uL (ref 3.87–5.11)
RDW: 15.7 % — ABNORMAL HIGH (ref 11.5–15.5)
WBC: 4 10*3/uL (ref 4.0–10.5)
nRBC: 0 % (ref 0.0–0.2)

## 2018-11-17 LAB — COMPREHENSIVE METABOLIC PANEL
ALT: 48 U/L — ABNORMAL HIGH (ref 0–44)
ALT: 67 U/L — ABNORMAL HIGH (ref 0–44)
AST: 36 U/L (ref 15–41)
AST: 59 U/L — ABNORMAL HIGH (ref 15–41)
Albumin: 2.3 g/dL — ABNORMAL LOW (ref 3.5–5.0)
Albumin: 3.3 g/dL — ABNORMAL LOW (ref 3.5–5.0)
Alkaline Phosphatase: 59 U/L (ref 38–126)
Alkaline Phosphatase: 78 U/L (ref 38–126)
Anion gap: 4 — ABNORMAL LOW (ref 5–15)
Anion gap: 7 (ref 5–15)
BILIRUBIN TOTAL: 0.1 mg/dL — AB (ref 0.3–1.2)
BUN: 9 mg/dL (ref 8–23)
BUN: 10 mg/dL (ref 8–23)
CO2: 20 mmol/L — ABNORMAL LOW (ref 22–32)
CO2: 26 mmol/L (ref 22–32)
Calcium: 5.9 mg/dL — CL (ref 8.9–10.3)
Calcium: 7.9 mg/dL — ABNORMAL LOW (ref 8.9–10.3)
Chloride: 116 mmol/L — ABNORMAL HIGH (ref 98–111)
Chloride: 106 mmol/L (ref 98–111)
Creatinine, Ser: 0.38 mg/dL — ABNORMAL LOW (ref 0.44–1.00)
Creatinine, Ser: 0.5 mg/dL (ref 0.44–1.00)
GFR calc Af Amer: >60 mL/min (ref >60)
GFR calc Af Amer: >60 mL/min (ref >60)
GFR calc non Af Amer: >60 mL/min (ref >60)
Glucose, Bld: 78 mg/dL (ref 70–99)
Glucose, Bld: 91 mg/dL (ref 70–99)
Potassium: 2.2 mmol/L — CL (ref 3.5–5.1)
Potassium: 2.9 mmol/L — ABNORMAL LOW (ref 3.5–5.1)
Sodium: 140 mmol/L (ref 135–145)
Sodium: 139 mmol/L (ref 135–145)
Total Bilirubin: 0.7 mg/dL (ref 0.3–1.2)
Total Protein: 4.7 g/dL — ABNORMAL LOW (ref 6.5–8.1)
Total Protein: 6.4 g/dL — ABNORMAL LOW (ref 6.5–8.1)

## 2018-11-17 LAB — URINALYSIS, ROUTINE W REFLEX MICROSCOPIC
BILIRUBIN URINE: NEGATIVE
Glucose, UA: NEGATIVE mg/dL
Hgb urine dipstick: NEGATIVE
Ketones, ur: NEGATIVE mg/dL
Leukocytes, UA: NEGATIVE
Nitrite: NEGATIVE
Protein, ur: NEGATIVE mg/dL
Specific Gravity, Urine: 1.008 (ref 1.005–1.030)
pH: 8 (ref 5.0–8.0)

## 2018-11-17 LAB — LIPASE, BLOOD: Lipase: 25 U/L (ref 11–51)

## 2018-11-17 MED ORDER — PREDNISONE 20 MG PO TABS: 40.0000 mg | ORAL_TABLET | Freq: Every day | ORAL | 0 refills | Status: DC

## 2018-11-17 MED ORDER — POTASSIUM CHLORIDE ER 10 MEQ PO TBCR: 20.0000 meq | EXTENDED_RELEASE_TABLET | Freq: Every day | ORAL | 0 refills | Status: DC

## 2018-11-17 MED ORDER — METHYLPREDNISOLONE SODIUM SUCC 125 MG IJ SOLR
125.0000 mg | Freq: Once | INTRAMUSCULAR | Status: AC
  Administered 2018-11-17: 125 mg via INTRAVENOUS
  Filled 2018-11-17: qty 2

## 2018-11-17 MED ORDER — POTASSIUM CHLORIDE CRYS ER 20 MEQ PO TBCR
40.0000 meq | EXTENDED_RELEASE_TABLET | Freq: Once | ORAL | Status: AC
  Administered 2018-11-17: 40 meq via ORAL
  Filled 2018-11-17: qty 2

## 2018-11-17 MED ORDER — MORPHINE SULFATE (PF) 4 MG/ML IV SOLN
4.0000 mg | Freq: Once | INTRAVENOUS | Status: AC
  Administered 2018-11-17: 4 mg via INTRAVENOUS
  Filled 2018-11-17: qty 1

## 2018-11-17 MED ORDER — POTASSIUM CHLORIDE 10 MEQ/100ML IV SOLN
10.0000 meq | Freq: Once | INTRAVENOUS | Status: DC
  Filled 2018-11-17: qty 100

## 2018-11-17 MED ORDER — FAMOTIDINE IN NACL 20-0.9 MG/50ML-% IV SOLN
20.0000 mg | Freq: Once | INTRAVENOUS | Status: AC
  Administered 2018-11-17: 20 mg via INTRAVENOUS
  Filled 2018-11-17: qty 50

## 2018-11-17 MED ORDER — ONDANSETRON HCL 4 MG/2ML IJ SOLN
4.0000 mg | Freq: Once | INTRAMUSCULAR | Status: AC
  Administered 2018-11-17: 4 mg via INTRAVENOUS
  Filled 2018-11-17: qty 2

## 2018-11-17 NOTE — Discharge Instructions (Signed)
Take another Benadryl tonight before you go to bed and then if you are still having any itching and swelling by tomorrow you should start the steroids for 3 days.  Also increase the potassium you are eating as well as use the potassium supplement for the next week.  You need to follow-up with your doctor even if your strain to feel better in a week to make sure your potassium looks okay.

## 2018-11-17 NOTE — ED Notes (Signed)
Bed: WA06 Expected date:  Expected time:  Means of arrival:  Comments: EMS 62yo allergy, haldol, benadryl

## 2018-11-17 NOTE — ED Triage Notes (Signed)
Per EMS patient c/o swelling of lips, eyelids, nose, hands and feet. Pt very irratic at the house, scratching non-stop at chest, speaking extremely fast. Unable to verbally calm her. Per EMS she understood she was hurting herself scratching, uncontrollable but could not stop. Denies any changes in foods, meds , denies drug use . Husband denies any abnormal behavior lately. Husband was called to the house and said this is not her normal behavior.

## 2018-11-17 NOTE — ED Notes (Signed)
ED Provider at bedside. 

## 2018-11-17 NOTE — ED Provider Notes (Signed)
[facility] DEPT Provider Note   CSN: [id number] Arrival date & time: 11/17/18  1652     History   Chief Complaint Chief Complaint  Patient presents with  . Allergic Reaction    HPI Erin Wolf is a 63 y.o. female.  The history is provided by the patient and the spouse.  Allergic Reaction  Presenting symptoms: itching, rash and swelling   Severity:  Severe Duration: started this morning but worsened about 2 hours ago. Prior allergic episodes:  Allergies to medications Context comment:  Did drink apple cider last night which she hasnt' had in a long time Relieved by:  Antihistamines (was given haldol and benadryl by EMS en route with improvement) Worsened by:  Nothing Ineffective treatments:  None tried   Past Medical History:  Diagnosis Date  . Anxiety   . Arthritis   . Chronic abdominal pain   . Chronic lower back pain   . Chronic nausea   . Depression   . Diverticulosis   . Fibromyalgia   . Frequency of urination   . GERD (gastroesophageal reflux disease)   . Headache   . Hematuria   . Hepatitis C antibody positive in blood    per pt told by pcp  . History of panic attacks   . History of syncope    hx recurrent syncope --- per epic domentation non-cardiac , orthostatic hypotension, anxiety, dehydration  . History of uterine fibroid   . HTN (hypertension), benign   . Hyperlipidemia 01/03/2012  . IBS (irritable bowel syndrome)   . Myalgia   . Seizure disorder ([place]) followed by pcp until new neurologist appr. in jan 2019   "started having them in my 20's" ,  petit mal ----  per pt last seizure one 2015  . SLE (systemic lupus erythematosus) [facility])    rheumatologist-- dr Gerilyn Nestle (consult 11-12-2016) having work-up done  . Ulcerative colitis    followed by dr Penelope Coop at [facility]  . Wears glasses     Patient Active Problem List   Diagnosis Date Noted  . Chronic migraine 11/10/2018  . Nausea & vomiting 10/18/2016  . History  of ulcerative colitis   . LLQ abdominal pain 10/17/2016  . Syncope and collapse 06/29/2016  . Left-sided weakness 06/29/2016  . Facial twitching 06/29/2016  . IBS (irritable bowel syndrome) 06/29/2016  . Faintness   . Dehydration 05/09/2014  . GERD (gastroesophageal reflux disease) 05/09/2014  . Gastroenteritis 05/09/2014  . Chest pain, atypical 05/09/2014  . Chronic ulcerative colitis ([place]) 05/09/2014  . Acute gastroenteritis 05/09/2014  . Ulcerative colitis ([place]) 05/27/2013  . Hypokalemia 05/27/2013  . Anemia 01/04/2012  . Transaminitis 01/04/2012  . Diarrhea 01/04/2012  . Chest pain 01/03/2012  . SOB (shortness of breath) 01/03/2012  . HTN (hypertension), benign 01/03/2012  . Hx of migraines 01/03/2012  . Seizure ([place]) 01/03/2012  . Depression 01/03/2012  . Ulcerative colitis 01/03/2012  . Chronic back pain 01/03/2012  . 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 Hyperlipidemia 01/03/2012  . Hepatitis C 01/03/2012  . Fibroids 01/03/2012    Past Surgical History:  Procedure Laterality Date  . ABDOMINAL HYSTERECTOMY  1988  . CHOLECYSTECTOMY OPEN  1978  . COLONOSCOPY N/A 05/31/2013   Procedure: COLONOSCOPY;  Surgeon: Wonda Horner, MD;  Location[facility] ENDOSCOPY;  Service: Endoscopy;  Laterality: N/A;  . CYSTOSCOPY/RETROGRADE/URETEROSCOPY Bilateral 07/23/2017   Procedure: CYSTOSCOPY/RETROGRADE/URETEROSCOPY;  Surgeon: Ceasar Mons, MD;  [facility];  Service: Urology;  Laterality: Bilateral;  .  ESOPHAGOGASTRODUODENOSCOPY (EGD) WITH PROPOFOL N/A 05/11/2014   Procedure: ESOPHAGOGASTRODUODENOSCOPY (EGD) WITH PROPOFOL;  Surgeon: Lear Ng, MD;  Location: [facility] ENDOSCOPY;  Service: Endoscopy;  Laterality: N/A;  egd first  . EUS N/A 06/21/2016   Procedure: ESOPHAGEAL ENDOSCOPIC ULTRASOUND (EUS) RADIAL;  Surgeon: Arta Silence, MD;  Location: [facility] ENDOSCOPY;  Service: Endoscopy;  Laterality: N/A;  . FLEXIBLE SIGMOIDOSCOPY N/A 05/11/2014   Procedure:  FLEXIBLE SIGMOIDOSCOPY;  Surgeon: Lear Ng, MD;  Location: [facility] ENDOSCOPY;  Service: Endoscopy;  Laterality: N/A;  . LAPAROTOMY W/ BILATERAL SALPINGOOPHORECTOMY  09-26-2003   dr Matthew Saras at [facility]  . LAPROSCOPY W/ LYSIS ADHESIONS  06-08-2003   dr Radene Knee [facility]  . TRANSTHORACIC ECHOCARDIOGRAM  04/29/2016   ef 60-65%,  grade 2 diastolic dysfunction/  trivial MR and TR  . TUBAL LIGATION Bilateral yrs ago     OB History   No obstetric history on file.      Home Medications    Prior to Admission medications   Medication Sig Start Date End Date Taking? Authorizing Provider  cyclobenzaprine (FLEXERIL) 10 MG tablet Take 1 tablet by mouth daily as needed for muscle spasms. 10/31/17   [provider]  cycloSPORINE (RESTASIS) 0.05 % ophthalmic emulsion Place 1 drop into both eyes 2 (two) times daily as needed for dry eyes.    [provider]  diazepam (VALIUM) 5 MG tablet Take 5 mg by mouth 2 (two) times daily as needed for anxiety. 05/07/17   [provider]  FLUoxetine (PROZAC) 20 MG capsule Take 20 mg by mouth every morning.    [provider]  furosemide (LASIX) 20 MG tablet Take 20 mg by mouth every morning.    [provider]  levETIRAcetam (KEPPRA XR) 500 MG 24 hr tablet Take 1-2 tablets (500-1,000 mg total) by mouth 2 (two) times daily. Take 1 tablet in the mornings and 2 tabs at bedtime. Patient taking differently: Take 500-1,000 mg by mouth See admin instructions. Take 500 mg in the mornings and1000 mg at bedtime. 07/01/16   Hongalgi, Lenis Dickinson, MD  mesalamine (ROWASA) 4 g enema Place 4 g rectally as needed (UC).     [provider]  metoCLOPramide (REGLAN) 10 MG tablet Take 1 tablet (10 mg total) by mouth every 6 (six) hours. Patient taking differently: Take 10 mg by mouth every 6 (six) hours as needed for nausea or vomiting.  04/10/17   Charlesetta Shanks, MD  ondansetron (ZOFRAN ODT) 8 MG disintegrating tablet Take 1 tablet (8 mg total) by  mouth every 8 (eight) hours as needed for nausea or vomiting. 06/06/17   Jola Schmidt, MD  Oxycodone HCl 10 MG TABS Take 10 mg by mouth 3 (three) times daily as needed for pain. 05/21/17   [provider]  rizatriptan (MAXALT-MLT) 5 MG disintegrating tablet Take 1 tablet (5 mg total) by mouth as needed. May repeat in 2 hours if needed 11/10/18   Marcial Pacas, MD    Family History Family History  Problem Relation Age of Onset  . Alzheimer's disease Mother   . Hypertension Mother   . Colon cancer Brother   . Hypertension Brother   . Other Father        ruptured appendix  . Breast cancer Sister   . Hypertension Sister   . Hypertension Sister   . Memory loss Brother   . Hypertension Sister   . Heart disease Sister     Social History Social History   Tobacco Use  . Smoking  status: Never Smoker  . Smokeless tobacco: Never Used  Substance Use Topics  . Alcohol use: Yes    Comment: very rare - special occasions  . Drug use: No     Allergies   Latex; Nsaids; Penicillins; Wellbutrin [bupropion hcl]; Aspirin; Darvocet [propoxyphene n-acetaminophen]; Isometheptene-dichloral-apap; Tape; Toradol [ketorolac tromethamine]; Tylenol [acetaminophen]; Milk-related compounds; Other; and Dilaudid [hydromorphone hcl]   Review of Systems Review of Systems  Gastrointestinal:       Started having lower abd cramping and 1 episode of vomiting earlier today.  Pain also started today when she woke up.  She has had no diarrhea.  She denies any fever.  She did complain of feeling some heaviness on her chest and trouble breathing earlier which is improved but still slightly present.  She states when she woke up this morning she had swelling in her face.  Husband present now stating her face looks much better but was swollen when he arrived home when she called 911.  Skin: Positive for itching and rash.  All other systems reviewed and are negative.    Physical Exam Updated Vital Signs SpO2 98%    Physical Exam Vitals signs and nursing note reviewed.  Constitutional:      General: She is not in acute distress.    Appearance: She is well-developed.  HENT:     Head: Normocephalic and atraumatic.     Comments: No notable swelling of the face, lips, tongue or uvula at this time    Mouth/Throat:     Mouth: Mucous membranes are dry.  Eyes:     Pupils: Pupils are equal, round, and reactive to light.  Cardiovascular:     Rate and Rhythm: Normal rate and regular rhythm.     Heart sounds: Normal heart sounds. No murmur. No friction rub.  Pulmonary:     Effort: Pulmonary effort is normal.     Breath sounds: Normal breath sounds. No wheezing or rales.     Comments: Abrasions over the upper chest consistent with scratch marks Abdominal:     General: Bowel sounds are normal. There is no distension.     Palpations: Abdomen is soft.     Tenderness: There is abdominal tenderness in the right lower quadrant and left lower quadrant. There is no guarding or rebound.  Musculoskeletal: Normal range of motion.        General: No tenderness.     Comments: No edema  Skin:    General: Skin is warm and dry.     Capillary Refill: Capillary refill takes less than 2 seconds.     Findings: No rash.  Neurological:     Mental Status: She is alert and oriented to person, place, and time. Mental status is at baseline.     Cranial Nerves: No cranial nerve deficit.  Psychiatric:        Behavior: Behavior normal.      ED Treatments / Results  Labs (all labs ordered are listed, but only abnormal results are displayed) Labs Reviewed  CBC WITH DIFFERENTIAL/PLATELET - Abnormal; Notable for the following components:      Result Value   Hemoglobin 11.5 (*)    MCH 25.7 (*)    MCHC 29.4 (*)    RDW 15.7 (*)    Neutro Abs 1.5 (*)    All other components within normal limits  COMPREHENSIVE METABOLIC PANEL - Abnormal; Notable for the following components:   Potassium 2.2 (*)    Chloride 116 (*)    CO2  20 (*)    Creatinine, Ser 0.38 (*)    Calcium 5.9 (*)    Total Protein 4.7 (*)    Albumin 2.3 (*)    ALT 48 (*)    Total Bilirubin 0.1 (*)    Anion gap 4 (*)    All other components within normal limits  URINALYSIS, ROUTINE W REFLEX MICROSCOPIC - Abnormal; Notable for the following components:   Color, Urine STRAW (*)    All other components within normal limits  COMPREHENSIVE METABOLIC PANEL - Abnormal; Notable for the following components:   Potassium 2.9 (*)    Calcium 7.9 (*)    Total Protein 6.4 (*)    Albumin 3.3 (*)    AST 59 (*)    ALT 67 (*)    All other components within normal limits  LIPASE, BLOOD    EKG EKG Interpretation  Date/Time:  Tuesday November 17 2018 18:19:10 EST Ventricular Rate:  68 PR Interval:    QRS Duration: 84 QT Interval:  424 QTC Calculation: 451 R Axis:   51 Text Interpretation:  Sinus rhythm No significant change since last tracing Confirmed by Blanchie Dessert ([id number]) on 11/17/2018 6:30:58 PM   Radiology Dg Chest Port 1 View  Result Date: 11/17/2018 CLINICAL DATA:  Facial and extremity swelling, pruritus. EXAM: PORTABLE CHEST 1 VIEW COMPARISON:  Chest radiograph December 31, 2017 FINDINGS: Cardiomediastinal silhouette is normal. No pleural effusions or focal consolidations. Similarly elevated RIGHT hemidiaphragm. Trachea projects midline and there is no pneumothorax. Soft tissue planes and included osseous structures are non-suspicious. IMPRESSION: No acute cardiopulmonary process.  Stable examination. Electronically Signed   By: Elon Alas M.D.   On: 11/17/2018 18:50    Procedures Procedures (including critical care time)  Medications Ordered in ED Medications  famotidine (PEPCID) IVPB 20 mg premix (has no administration in time range)  morphine 4 MG/ML injection 4 mg (has no administration in time range)  ondansetron (ZOFRAN) injection 4 mg (has no administration in time range)     Initial Impression / Assessment and Plan /  ED Course  I have reviewed the triage vital signs and the nursing notes.  Pertinent labs & imaging results that were available during my care of the patient were reviewed by me and considered in my medical decision making (see chart for details).     Patient is a 63 year old female with a history of ulcerative colitis who is presenting today with concern for an allergic reaction.  She states when she woke up this morning her face was swollen and she felt like her ulcerative colitis was acting up with lower abdominal cramping after eating oatmeal.  She did have one episode of vomiting but continued to have the swelling of her face with worsening itchiness.  It peaked approximately 2 hours ago when she states she just could not stop scratching her chest.  She did drink apple cider last night which she has not had in a very long time but it is the only thing new she can think of.  Patient was given Haldol due to the excessive itching and hysteria at the scene as well as Benadryl.  Upon arrival here patient states the swelling and itching is significantly improved.  Her husband is now present and also states the swelling has significantly improved.  She does have lower abdominal pain as well which seems to be most likely related to her ulcerative colitis but could also be from allergic reaction.  Will give Pepcid and steroids.  Also will do labs and EKG as she is complaining of some chest heaviness.  8:22 PM Patient's labs initially with a CMP that returned with a potassium of 2.2 and a calcium of 5.  When it was repeated potassium was 2.9 and calcium was 7.9.  Patient's potassium was replaced.  UA within normal limits, CBC without acute findings.  Chest x-ray and EKG were stable.  On reevaluation pt feeling much better and wanting to go home.  Final Clinical Impressions(s) / ED Diagnoses   Final diagnoses:  Allergic reaction, initial encounter  Hypokalemia    ED Discharge Orders         Ordered     predniSONE (DELTASONE) 20 MG tablet  Daily     11/17/18 2050    potassium chloride (K-DUR) 10 MEQ tablet  Daily     11/17/18 2050           Blanchie Dessert, MD 11/17/18 2052

## 2018-12-03 DIAGNOSIS — G8929 Other chronic pain: Secondary | ICD-10-CM | POA: Diagnosis not present

## 2018-12-03 DIAGNOSIS — F324 Major depressive disorder, single episode, in partial remission: Secondary | ICD-10-CM | POA: Diagnosis not present

## 2018-12-03 DIAGNOSIS — T7840XD Allergy, unspecified, subsequent encounter: Secondary | ICD-10-CM | POA: Diagnosis not present

## 2018-12-03 DIAGNOSIS — E876 Hypokalemia: Secondary | ICD-10-CM | POA: Diagnosis not present

## 2019-02-04 ENCOUNTER — Telehealth: Payer: Self-pay

## 2019-02-04 DIAGNOSIS — M25562 Pain in left knee: Secondary | ICD-10-CM | POA: Diagnosis not present

## 2019-02-04 NOTE — Telephone Encounter (Signed)
Unable to get in contact with the patient to convert their office visit into a webex visit. I left them a voicemail asking to return my call. Office number was provided.   

## 2019-02-08 NOTE — Telephone Encounter (Signed)
Pt has called back to inform that she wants to cancel the appointment because she has no issues to discuss at this time.  Pt said she will call back after pandemic to reschedule a follow up

## 2019-02-08 NOTE — Telephone Encounter (Signed)
Noted! Thank you

## 2019-02-09 ENCOUNTER — Ambulatory Visit: Payer: Medicare Other | Admitting: Neurology

## 2019-02-16 DIAGNOSIS — M25562 Pain in left knee: Secondary | ICD-10-CM | POA: Insufficient documentation

## 2019-02-17 DIAGNOSIS — M25562 Pain in left knee: Secondary | ICD-10-CM | POA: Diagnosis not present

## 2019-02-17 DIAGNOSIS — M1712 Unilateral primary osteoarthritis, left knee: Secondary | ICD-10-CM | POA: Diagnosis not present

## 2019-02-24 DIAGNOSIS — E7800 Pure hypercholesterolemia, unspecified: Secondary | ICD-10-CM | POA: Diagnosis not present

## 2019-02-24 DIAGNOSIS — G8929 Other chronic pain: Secondary | ICD-10-CM | POA: Diagnosis not present

## 2019-02-24 DIAGNOSIS — E119 Type 2 diabetes mellitus without complications: Secondary | ICD-10-CM | POA: Diagnosis not present

## 2019-02-24 DIAGNOSIS — I10 Essential (primary) hypertension: Secondary | ICD-10-CM | POA: Diagnosis not present

## 2019-02-24 DIAGNOSIS — F419 Anxiety disorder, unspecified: Secondary | ICD-10-CM | POA: Diagnosis not present

## 2019-02-24 DIAGNOSIS — R600 Localized edema: Secondary | ICD-10-CM | POA: Diagnosis not present

## 2019-02-24 DIAGNOSIS — F324 Major depressive disorder, single episode, in partial remission: Secondary | ICD-10-CM | POA: Diagnosis not present

## 2019-02-24 DIAGNOSIS — K5190 Ulcerative colitis, unspecified, without complications: Secondary | ICD-10-CM | POA: Diagnosis not present

## 2019-03-19 DIAGNOSIS — Z6835 Body mass index (BMI) 35.0-35.9, adult: Secondary | ICD-10-CM | POA: Diagnosis not present

## 2019-03-19 DIAGNOSIS — I10 Essential (primary) hypertension: Secondary | ICD-10-CM | POA: Diagnosis not present

## 2019-03-19 DIAGNOSIS — E119 Type 2 diabetes mellitus without complications: Secondary | ICD-10-CM | POA: Diagnosis not present

## 2019-03-19 DIAGNOSIS — M1712 Unilateral primary osteoarthritis, left knee: Secondary | ICD-10-CM | POA: Diagnosis not present

## 2019-03-19 DIAGNOSIS — M25562 Pain in left knee: Secondary | ICD-10-CM | POA: Diagnosis not present

## 2019-03-24 DIAGNOSIS — M1712 Unilateral primary osteoarthritis, left knee: Secondary | ICD-10-CM | POA: Diagnosis not present

## 2019-03-24 DIAGNOSIS — R740 Nonspecific elevation of levels of transaminase and lactic acid dehydrogenase [LDH]: Secondary | ICD-10-CM | POA: Diagnosis not present

## 2019-04-08 DIAGNOSIS — M1712 Unilateral primary osteoarthritis, left knee: Secondary | ICD-10-CM | POA: Diagnosis present

## 2019-04-12 NOTE — H&P (Signed)
TOTAL KNEE ADMISSION H&P  Patient is being admitted for left total knee arthroplasty.  Subjective:  Chief Complaint:   Left knee primary OA / pain  HPI: Erin Wolf, 63 y.o. female, has a history of pain and functional disability in the left knee due to arthritis and has failed non-surgical conservative treatments for greater than 12 weeks to include NSAID's and/or analgesics, corticosteriod injections, use of assistive devices and activity modification.  Onset of symptoms was gradual, starting 2+ years ago with gradually worsening course since that time. The patient noted no past surgery on the left knee(s).  Patient currently rates pain in the left knee(s) at 10 out of 10 with activity. Patient has worsening of pain with activity and weight bearing, pain that interferes with activities of daily living, pain with passive range of motion, crepitus and joint swelling.  Patient has evidence of periarticular osteophytes and joint space narrowing by imaging studies.  There is no active infection.  Risks, benefits and expectations were discussed with the patient.  Risks including but not limited to the risk of anesthesia, blood clots, nerve damage, blood vessel damage, failure of the prosthesis, infection and up to and including death.  Patient understand the risks, benefits and expectations and wishes to proceed with surgery.   PCP: Shirline Frees, MD  D/C Plans:       Home   Post-op Meds:       No Rx given   Tranexamic Acid:      To be given - IV   Decadron:      Is to be given  FYI:     Xarelto (unable to use ASA due to colitis)  Norco  DME:   Pt already has equipment  PT:   [place]: [facility] [place]    Patient Active Problem List   Diagnosis Date Noted  . Chronic migraine 11/10/2018  . Nausea & vomiting 10/18/2016  . History of ulcerative colitis   . LLQ abdominal pain 10/17/2016  . Syncope and collapse 06/29/2016  . Left-sided weakness 06/29/2016  . Facial  twitching 06/29/2016  . IBS (irritable bowel syndrome) 06/29/2016  . Faintness   . Dehydration 05/09/2014  . GERD (gastroesophageal reflux disease) 05/09/2014  . Gastroenteritis 05/09/2014  . Chest pain, atypical 05/09/2014  . Chronic ulcerative colitis ([place]) 05/09/2014  . Acute gastroenteritis 05/09/2014  . Ulcerative colitis ([place]) 05/27/2013  . Hypokalemia 05/27/2013  . Anemia 01/04/2012  . Transaminitis 01/04/2012  . Diarrhea 01/04/2012  . Chest pain 01/03/2012  . SOB (shortness of breath) 01/03/2012  . HTN (hypertension), benign 01/03/2012  . Hx of migraines 01/03/2012  . Seizure ([place]) 01/03/2012  . Depression 01/03/2012  . Ulcerative colitis 01/03/2012  . Chronic back pain 01/03/2012  . 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 Hyperlipidemia 01/03/2012  . Hepatitis C 01/03/2012  . Fibroids 01/03/2012   Past Medical History:  Diagnosis Date  . Anxiety   . Arthritis   . Chronic abdominal pain   . Chronic lower back pain   . Chronic nausea   . Depression   . Diverticulosis   . Fibromyalgia   . Frequency of urination   . GERD (gastroesophageal reflux disease)   . Headache   . Hematuria   . Hepatitis C antibody positive in blood    per pt told by pcp  . History of panic attacks   . History of syncope    hx recurrent syncope --- per epic domentation non-cardiac , orthostatic  hypotension, anxiety, dehydration  . History of uterine fibroid   . HTN (hypertension), benign   . Hyperlipidemia 01/03/2012  . IBS (irritable bowel syndrome)   . Myalgia   . Seizure disorder ([place]) followed by pcp until new neurologist appr. in jan 2019   "started having them in my 20's" ,  pet[place] mal ----  per pt last seizure one 2015  . SLE (systemic lupus erythematosus) [facility])    rheumatologist-- dr Gerilyn Nestle (consult 11-12-2016) having work-up done  . Ulcerative colitis    followed by dr Penelope Coop at [place]  . Wears glasses     Past Surgical History:  Procedure Laterality Date  . ABDOMINAL  HYSTERECTOMY  1988  . CHOLECYSTECTOMY OPEN  1978  . COLONOSCOPY N/A 05/31/2013   Procedure: COLONOSCOPY;  Surgeon: Wonda Horner, MD;  Location: [facility] ENDOSCOPY;  Service: Endoscopy;  Laterality: N/A;  . CYSTOSCOPY/RETROGRADE/URETEROSCOPY Bilateral 07/23/2017   Procedure: CYSTOSCOPY/RETROGRADE/URETEROSCOPY;  Surgeon: Ceasar Mons, MD;  Location: [facility];  Service: Urology;  Laterality: Bilateral;  . ESOPHAGOGASTRODUODENOSCOPY (EGD) WITH PROPOFOL N/A 05/11/2014   Procedure: ESOPHAGOGASTRODUODENOSCOPY (EGD) WITH PROPOFOL;  Surgeon: Lear Ng, MD;  Location: [facility] ENDOSCOPY;  Service: Endoscopy;  Laterality: N/A;  egd first  . EUS N/A 06/21/2016   Procedure: ESOPHAGEAL ENDOSCOPIC ULTRASOUND (EUS) RADIAL;  Surgeon: Arta Silence, MD;  Location: [facility] ENDOSCOPY;  Service: Endoscopy;  Laterality: N/A;  . FLEXIBLE SIGMOIDOSCOPY N/A 05/11/2014   Procedure: FLEXIBLE SIGMOIDOSCOPY;  Surgeon: Lear Ng, MD;  Location: [facility] ENDOSCOPY;  Service: Endoscopy;  Laterality: N/A;  . LAPAROTOMY W/ BILATERAL SALPINGOOPHORECTOMY  09-26-2003   dr  Saras at [facility]  . LAPROSCOPY W/ LYSIS ADHESIONS  06-08-2003   dr Radene Knee [facility]  . TRANSTHORACIC ECHOCARDIOGRAM  04/29/2016   ef 60-65%,  grade 2 diastolic dysfunction/  trivial MR and TR  . TUBAL LIGATION Bilateral yrs ago    No current facility-administered medications for this encounter.    Current Outpatient Medications  Medication Sig Dispense Refill Last Dose  . cyclobenzaprine (FLEXERIL) 10 MG tablet Take 1 tablet by mouth daily as needed for muscle spasms.     . cycloSPORINE (RESTASIS) 0.05 % ophthalmic emulsion Place 1 drop into both eyes 2 (two) times daily as needed for dry eyes.     Marland Kitchen FLUoxetine (PROZAC) 20 MG capsule Take 20 mg by mouth every morning.     . furosemide (LASIX) 20 MG tablet Take 20 mg by mouth every morning.     . levETIRAcetam (KEPPRA XR) 500 MG 24 hr tablet Take 1-2 tablets (500-1,000 mg total) by mouth 2 (two)  times daily. Take 1 tablet in the mornings and 2 tabs at bedtime. (Patient taking differently: Take 500-1,000 mg by mouth See admin instructions. Take 500 mg in the mornings and1000 mg at bedtime.) 60 tablet 0   . metoCLOPramide (REGLAN) 10 MG tablet Take 1 tablet (10 mg total) by mouth every 6 (six) hours. (Patient not taking: Reported on 11/17/2018) 30 tablet 0   . ondansetron (ZOFRAN ODT) 8 MG disintegrating tablet Take 1 tablet (8 mg total) by mouth every 8 (eight) hours as needed for nausea or vomiting. (Patient not taking: Reported on 11/17/2018) 10 tablet 0   . Oxycodone HCl 10 MG TABS Take 10 mg by mouth 3 (three) times daily as needed for pain.     . potassium chloride (K-DUR) 10 MEQ tablet Take 2 tablets (20 mEq total) by mouth daily. 14 tablet 0   . predniSONE (DELTASONE) 20 MG tablet  Take 2 tablets (40 mg total) by mouth daily. 6 tablet 0   . rizatriptan (MAXALT-MLT) 5 MG disintegrating tablet Take 1 tablet (5 mg total) by mouth as needed. May repeat in 2 hours if needed 12 tablet 6    Allergies  Allergen Reactions  . Latex Other (See Comments)    Peels skin  . Nsaids Other (See Comments)    HAS COLITIS FLARES  . Penicillins Hives    Has patient had a PCN reaction causing immediate rash, facial/tongue/throat swelling, SOB or lightheadedness with hypotension: Yes Has patient had a PCN reaction causing severe rash involving mucus membranes or skin necrosis: Unknown Has patient had a PCN reaction that required hospitalization: no Has patient had a PCN reaction occurring within the last 10 years: No If all of the above answers are "NO", then may proceed with Cephalosporin use.   . Wellbutrin [Bupropion Hcl] Other (See Comments)    Insomnia and headaches  . Aspirin Nausea Only  . Darvocet [Propoxyphene N-Acetaminophen] Nausea And Vomiting  . Isometheptene-Dichloral-Apap Nausea Only    IV dye?  Marland Kitchen Tape Hives  . Toradol [Ketorolac Tromethamine] Nausea And Vomiting  . Tylenol  [Acetaminophen] Nausea Only  . Milk-Related Compounds     Can cause diarrhea  . Other     Spicy foods cause diarrhea due to colitis  . Dilaudid [Hydromorphone Hcl] Itching, Nausea Only and Rash    Social History   Tobacco Use  . Smoking status: Never Smoker  . Smokeless tobacco: Never Used  Substance Use Topics  . Alcohol use: Yes    Comment: very rare - special occasions    Family History  Problem Relation Age of Onset  . Alzheimer's disease Mother   . Hypertension Mother   . Colon cancer Brother   . Hypertension Brother   . Other Father        ruptured appendix  . Breast cancer Sister   . Hypertension Sister   . Hypertension Sister   . Memory loss Brother   . Hypertension Sister   . Heart disease Sister      Review of Systems  Constitutional: Negative.   HENT: Negative.   Eyes: Negative.   Respiratory: Positive for shortness of breath.   Cardiovascular: Negative.   Gastrointestinal: Positive for heartburn.  Genitourinary: Negative.   Musculoskeletal: Positive for back pain and joint pain.  Skin: Negative.   Neurological: Positive for headaches.  Endo/Heme/Allergies: Negative.   Psychiatric/Behavioral: Positive for depression.    Objective:  Physical Exam  Constitutional: She is oriented to person, place, and time. She appears well-developed.  HENT:  Head: Normocephalic.  Eyes: Pupils are equal, round, and reactive to light.  Neck: Neck supple. No JVD present. No tracheal deviation present. No thyromegaly present.  Cardiovascular: Normal rate, regular rhythm and intact distal pulses.  Respiratory: Effort normal and breath sounds normal. No respiratory distress. She has no wheezes.  GI: Soft. There is no abdominal tenderness. There is no guarding.  Lymphadenopathy:    She has no cervical adenopathy.  Neurological: She is alert and oriented to person, place, and time. A sensory deficit (numbness and tingling bilateral feet) is present.  Skin: Skin is warm  and dry.  Psychiatric: She has a normal mood and affect.       Labs:  Estimated body mass index is 37.94 kg/m as calculated from the following:   Height as of 11/10/18: 5\' 5"  (1.651 m).   Weight as of 11/10/18: 103.4 kg.  Imaging Review Plain radiographs demonstrate severe degenerative joint disease of the left knee. The bone quality appears to be good for age and reported activity level.      Assessment/Plan:  End stage arthritis, left knee   The patient history, physical examination, clinical judgment of the provider and imaging studies are consistent with end stage degenerative joint disease of the left knee(s) and total knee arthroplasty is deemed medically necessary. The treatment options including medical management, injection therapy arthroscopy and arthroplasty were discussed at length. The risks and benefits of total knee arthroplasty were presented and reviewed. The risks due to aseptic loosening, infection, stiffness, patella tracking problems, thromboembolic complications and other imponderables were discussed. The patient acknowledged the explanation, agreed to proceed with the plan and consent was signed. Patient is being admitted for inpatient treatment for surgery, pain control, PT, OT, prophylactic antibiotics, VTE prophylaxis, progressive ambulation and ADL's and discharge planning. The patient is planning to be discharged home.     Patient's anticipated LOS is less than 2 midnights, meeting these requirements: - Younger than 68 - Lives within 1 hour of care - Has a competent adult at home to recover with post-op recover - NO history of  - Chronic pain requiring opiods  - Diabetes  - Coronary Artery Disease  - Heart failure  - Heart attack  - Stroke  - DVT/VTE  - Cardiac arrhythmia  - Respiratory Failure/COPD  - Renal failure  - Anemia  - Advanced Liver disease    [place] Pugh. Trestin Vences   PA-C  04/12/2019, 1:45 PM

## 2019-04-19 NOTE — Patient Instructions (Addendum)
Erin Wolf   Your procedure is scheduled on: 04-27-2019   Report to [facility] Main  Entrance    Report to admitting at [place] 19 TEST ON 04-23-19 @ 12:05 PM, THIS TEST MUST BE DONE BEFORE SURGERY, COME TO [place]. ONCE YOUR COVID TEST IS COMPLETED, PLEASE BEGIN THE QUARANTINE INSTRUCTIONS AS OUTLINED IN YOUR HANDOUT.   Call this number if you have problems the morning of surgery [phone]    Remember:  NO SOLID FOOD AFTER MIDNIGHT THE NIGHT PRIOR TO SURGERY. NOTHING BY MOUTH EXCEPT CLEAR LIQUIDS UNTIL 540 AM.  PLEASE FINISH G2  DRINK PER SURGEON ORDER 3 HOURS PRIOR TO SCHEDULED SURGERY TIME WHICH NEEDS TO BE COMPLETED AT 540 AM.     CLEAR LIQUID DIET   Foods Allowed                                                                     Foods Excluded  Coffee and tea, regular and decaf                             liquids that you cannot  Plain Jell-O in any flavor                                             see through such as: Fruit ices (not with fruit pulp)                                     milk, soups, orange juice  Iced Popsicles                                    All solid food Carbonated beverages, regular and diet                                    Cranberry, grape and apple juices Sports drinks like Gatorade Lightly seasoned clear broth or consume(fat free) Sugar, honey syrup  Sample Menu Breakfast                                Lunch                                     Supper Cranberry juice                    Beef broth                            Chicken broth Jell-O  Grape juice                           Apple juice Coffee or tea                        Jell-O                                      Popsicle                                                Coffee or tea                        Coffee or  tea  _____________________________________________________________________    Take these medicines the morning of surgery with A SIP OF WATER: Fluoxetine (Prozac). You may use and bring your eyedrops as usual.        BRUSH YOUR TEETH MORNING OF SURGERY AND RINSE YOUR MOUTH OUT, NO CHEWING GUM CANDY OR MINTS.                          You may not have any metal on your body including hair pins and              piercings     Do not wear jewelry, make-up, lotions, powders or perfumes, deodorant             Do not wear nail polish.  Do not shave  48 hours prior to surgery.          Do not bring valuables to the hospital. [place].  Contacts, dentures or bridgework may not be worn into surgery.     _____________________________________________________________________             [facility] - Preparing for Surgery Before surgery, you can play an important role.  Because skin is not sterile, your skin needs to be as free of germs as possible.  You can reduce the number of germs on your skin by washing with CHG (chlorahexidine gluconate) soap before surgery.  CHG is an antiseptic cleaner which kills germs and bonds with the skin to continue killing germs even after washing. Please DO NOT use if you have an allergy to CHG or antibacterial soaps.  If your skin becomes reddened/irritated stop using the CHG and inform your nurse when you arrive at Short Stay. Do not shave (including legs and underarms) for at least 48 hours prior to the first CHG shower.  You may shave your face/neck. Please follow these instructions carefully:  1.  Shower with CHG Soap the night before surgery and the  morning of Surgery.  2.  If you choose to wash your hair, wash your hair first as usual with your  normal  shampoo.  3.  After you shampoo, rinse your hair and body thoroughly to remove the  shampoo.                           4.  Use CHG as you would any other  liquid soap.  You can apply chg directly  to the skin and wash                       Gently with a scrungie or clean washcloth.  5.  Apply the CHG Soap to your body ONLY FROM THE NECK DOWN.   Do not use on face/ open                           Wound or open sores. Avoid contact with eyes, ears mouth and genitals (private parts).                       Wash face,  Genitals (private parts) with your normal soap.             6.  Wash thoroughly, paying special attention to the area where your surgery  will be performed.  7.  Thoroughly rinse your body with warm water from the neck down.  8.  DO NOT shower/wash with your normal soap after using and rinsing off  the CHG Soap.                9.  Pat yourself dry with a clean towel.            10.  Wear clean pajamas.            11.  Place clean sheets on your bed the night of your first shower and do not  sleep with pets. Day of Surgery : Do not apply any lotions/deodorants the morning of surgery.  Please wear clean clothes to the hospital/surgery center.  FAILURE TO FOLLOW THESE INSTRUCTIONS MAY RESULT IN THE CANCELLATION OF YOUR SURGERY PATIENT SIGNATURE_________________________________  NURSE SIGNATURE__________________________________  ________________________________________________________________________   Adam Phenix  An incentive spirometer is a tool that can help keep your lungs clear and active. This tool measures how well you are filling your lungs with each breath. Taking long deep breaths may help reverse or decrease the chance of developing breathing (pulmonary) problems (especially infection) following:  A long period of time when you are unable to move or be active. BEFORE THE PROCEDURE   If the spirometer includes an indicator to show your best effort, your nurse or respiratory therapist will set it to a desired goal.  If possible, sit up straight or lean slightly forward. Try not to slouch.  Hold the incentive  spirometer in an upright position. INSTRUCTIONS FOR USE  1. Sit on the edge of your bed if possible, or sit up as far as you can in bed or on a chair. 2. Hold the incentive spirometer in an upright position. 3. Breathe out normally. 4. Place the mouthpiece in your mouth and seal your lips tightly around it. 5. Breathe in slowly and as deeply as possible, raising the piston or the ball toward the top of the column. 6. Hold your breath for 3-5 seconds or for as long as possible. Allow the piston or ball to fall to the bottom of the column. 7. Remove the mouthpiece from your mouth and breathe out normally. 8. Rest for a few seconds and repeat Steps 1 through 7 at least 10 times every 1-2 hours when you are awake. Take your time and take a few normal breaths between deep breaths. 9. The spirometer may include an indicator to show your  best effort. Use the indicator as a goal to work toward during each repetition. 10. After each set of 10 deep breaths, practice coughing to be sure your lungs are clear. If you have an incision (the cut made at the time of surgery), support your incision when coughing by placing a pillow or rolled up towels firmly against it. Once you are able to get out of bed, walk around indoors and cough well. You may stop using the incentive spirometer when instructed by your caregiver.  RISKS AND COMPLICATIONS  Take your time so you do not get dizzy or light-headed.  If you are in pain, you may need to take or ask for pain medication before doing incentive spirometry. It is harder to take a deep breath if you are having pain. AFTER USE  Rest and breathe slowly and easily.  It can be helpful to keep track of a log of your progress. Your caregiver can provide you with a simple table to help with this. If you are using the spirometer at home, follow these instructions: [place] IF:   You are having difficultly using the spirometer.  You have trouble using the  spirometer as often as instructed.  Your pain medication is not giving enough relief while using the spirometer.  You develop fever of 100.5 F (38.1 C) or higher. SEEK IMMEDIATE MEDICAL CARE IF:   You cough up bloody sputum that had not been present before.  You develop fever of 102 F (38.9 C) or greater.  You develop worsening pain at or near the incision site. MAKE SURE YOU:   Understand these instructions.  Will watch your condition.  Will get help right away if you are not doing well or get worse. Document Released: 02/17/2007 Document Revised: 12/30/2011 Document Reviewed: 04/20/2007 ExitCare Patient Information 2014 Exit[employer], [place].   ________________________________________________________________________  WHAT IS A BLOOD TRANSFUSION? Blood Transfusion Information  A transfusion is the replacement of blood or some of its parts. Blood is made up of multiple cells which provide different functions.  Red blood cells carry oxygen and are used for blood loss replacement.  White blood cells fight against infection.  Platelets control bleeding.  Plasma helps clot blood.  Other blood products are available for specialized needs, such as hemophilia or other clotting disorders. BEFORE THE TRANSFUSION  Who gives blood for transfusions?   Healthy volunteers who are fully evaluated to make sure their blood is safe. This is blood bank blood. Transfusion therapy is the safest it has ever been in the practice of medicine. Before blood is taken from a donor, a complete history is taken to make sure that person has no history of diseases nor engages in risky social behavior (examples are intravenous drug use or sexual activity with multiple partners). The donor's travel history is screened to minimize risk of transmitting infections, such as malaria. The donated blood is tested for signs of infectious diseases, such as HIV and hepatitis. The blood is then tested to be sure it is  compatible with you in order to minimize the chance of a transfusion reaction. If you or a relative donates blood, this is often done in anticipation of surgery and is not appropriate for emergency situations. It takes many days to process the donated blood. RISKS AND COMPLICATIONS Although transfusion therapy is very safe and saves many lives, the main dangers of transfusion include:   Getting an infectious disease.  Developing a transfusion reaction. This is an allergic reaction to something  in the blood you were given. Every precaution is taken to prevent this. The decision to have a blood transfusion has been considered carefully by your caregiver before blood is given. Blood is not given unless the benefits outweigh the risks. AFTER THE TRANSFUSION  Right after receiving a blood transfusion, you will usually feel much better and more energetic. This is especially true if your red blood cells have gotten low (anemic). The transfusion raises the level of the red blood cells which carry oxygen, and this usually causes an energy increase.  The nurse administering the transfusion will monitor you carefully for complications. HOME CARE INSTRUCTIONS  No special instructions are needed after a transfusion. You may find your energy is better. Speak with your caregiver about any limitations on activity for underlying diseases you may have. SEEK MEDICAL CARE IF:   Your condition is not improving after your transfusion.  You develop redness or irritation at the intravenous (IV) site. SEEK IMMEDIATE MEDICAL CARE IF:  Any of the following symptoms occur over the next 12 hours:  Shaking chills.  You have a temperature by mouth above 102 F (38.9 C), not controlled by medicine.  Chest, back, or muscle pain.  People around you feel you are not acting correctly or are confused.  Shortness of breath or difficulty breathing.  Dizziness and fainting.  You get a rash or develop hives.  You have  a decrease in urine output.  Your urine turns a dark color or changes to pink, red, or brown. Any of the following symptoms occur over the next 10 days:  You have a temperature by mouth above 102 F (38.9 C), not controlled by medicine.  Shortness of breath.  Weakness after normal activity.  The white part of the eye turns yellow (jaundice).  You have a decrease in the amount of urine or are urinating less often.  Your urine turns a dark color or changes to pink, red, or brown. Document Released: 10/04/2000 Document Revised: 12/30/2011 Document Reviewed: 05/23/2008 [facility] Patient Information 2014 [place], [place].  _______________________________________________________________________

## 2019-04-20 ENCOUNTER — Encounter (HOSPITAL_COMMUNITY)
Admission: RE | Admit: 2019-04-20 | Discharge: 2019-04-20 | Disposition: A | Discharge status: Home / Self Care | Payer: Medicare HMO | Source: Ambulatory Visit | Attending: Orthopedic Surgery | Admitting: Orthopedic Surgery

## 2019-04-20 ENCOUNTER — Encounter (HOSPITAL_COMMUNITY): Payer: Self-pay

## 2019-04-20 ENCOUNTER — Other Ambulatory Visit: Payer: Self-pay

## 2019-04-20 DIAGNOSIS — Z01812 Encounter for preprocedural laboratory examination: Secondary | ICD-10-CM | POA: Insufficient documentation

## 2019-04-20 DIAGNOSIS — M1712 Unilateral primary osteoarthritis, left knee: Secondary | ICD-10-CM | POA: Insufficient documentation

## 2019-04-20 DIAGNOSIS — Z1159 Encounter for screening for other viral diseases: Secondary | ICD-10-CM | POA: Insufficient documentation

## 2019-04-20 DIAGNOSIS — Z79899 Other long term (current) drug therapy: Secondary | ICD-10-CM | POA: Insufficient documentation

## 2019-04-20 DIAGNOSIS — I10 Essential (primary) hypertension: Secondary | ICD-10-CM | POA: Insufficient documentation

## 2019-04-20 DIAGNOSIS — F329 Major depressive disorder, single episode, unspecified: Secondary | ICD-10-CM | POA: Diagnosis not present

## 2019-04-20 DIAGNOSIS — E785 Hyperlipidemia, unspecified: Secondary | ICD-10-CM | POA: Insufficient documentation

## 2019-04-20 LAB — BASIC METABOLIC PANEL
Anion gap: 10 (ref 5–15)
BUN: 16 mg/dL (ref 8–23)
CO2: 24 mmol/L (ref 22–32)
Calcium: 9.9 mg/dL (ref 8.9–10.3)
Chloride: 108 mmol/L (ref 98–111)
Creatinine, Ser: 0.66 mg/dL (ref 0.44–1.00)
GFR calc Af Amer: >60 mL/min (ref >60)
GFR calc non Af Amer: >60 mL/min (ref >60)
Glucose, Bld: 96 mg/dL (ref 70–99)
Potassium: 4.4 mmol/L (ref 3.5–5.1)
Sodium: 142 mmol/L (ref 135–145)

## 2019-04-20 LAB — SURGICAL PCR SCREEN
MRSA, PCR: NEGATIVE
Staphylococcus aureus: NEGATIVE

## 2019-04-20 LAB — CBC
HCT: 50.5 % — ABNORMAL HIGH (ref 36.0–46.0)
Hemoglobin: 15.5 g/dL — ABNORMAL HIGH (ref 12.0–15.0)
MCH: 26.3 pg (ref 26.0–34.0)
MCHC: 30.7 g/dL (ref 30.0–36.0)
MCV: 85.6 fL (ref 80.0–100.0)
Platelets: 377 10*3/uL (ref 150–400)
RBC: 5.9 MIL/uL — ABNORMAL HIGH (ref 3.87–5.11)
RDW: 15.4 % (ref 11.5–15.5)
WBC: 4.8 10*3/uL (ref 4.0–10.5)
nRBC: 0 % (ref 0.0–0.2)

## 2019-04-20 LAB — HEMOGLOBIN A1C
Hgb A1c MFr Bld: 6.2 % — ABNORMAL HIGH (ref 4.8–5.6)
Mean Plasma Glucose: 131.24 mg/dL

## 2019-04-20 LAB — GLUCOSE, CAPILLARY: Glucose-Capillary: 112 mg/dL — ABNORMAL HIGH (ref 70–99)

## 2019-04-20 NOTE — Progress Notes (Signed)
11-17-18 (Epic) EKG, CXR

## 2019-04-21 ENCOUNTER — Encounter (HOSPITAL_COMMUNITY): Payer: Self-pay | Admitting: Physician Assistant

## 2019-04-21 LAB — ABO/RH: ABO/RH(D): O POS

## 2019-04-22 ENCOUNTER — Emergency Department (HOSPITAL_COMMUNITY)
Admission: EM | Admit: 2019-04-22 | Discharge: 2019-04-22 | Disposition: A | Discharge status: Home / Self Care | Payer: Medicare HMO | Attending: Emergency Medicine | Admitting: Emergency Medicine

## 2019-04-22 ENCOUNTER — Encounter (HOSPITAL_COMMUNITY): Payer: Self-pay | Admitting: Emergency Medicine

## 2019-04-22 ENCOUNTER — Other Ambulatory Visit: Payer: Self-pay

## 2019-04-22 DIAGNOSIS — R112 Nausea with vomiting, unspecified: Secondary | ICD-10-CM | POA: Insufficient documentation

## 2019-04-22 DIAGNOSIS — I10 Essential (primary) hypertension: Secondary | ICD-10-CM | POA: Insufficient documentation

## 2019-04-22 DIAGNOSIS — Z91040 Latex allergy status: Secondary | ICD-10-CM | POA: Insufficient documentation

## 2019-04-22 DIAGNOSIS — R197 Diarrhea, unspecified: Secondary | ICD-10-CM | POA: Diagnosis not present

## 2019-04-22 DIAGNOSIS — Z79899 Other long term (current) drug therapy: Secondary | ICD-10-CM | POA: Diagnosis not present

## 2019-04-22 LAB — URINALYSIS, MICROSCOPIC (REFLEX)

## 2019-04-22 LAB — COMPREHENSIVE METABOLIC PANEL
ALT: 24 U/L (ref 0–44)
AST: 24 U/L (ref 15–41)
Albumin: 4.1 g/dL (ref 3.5–5.0)
Alkaline Phosphatase: 72 U/L (ref 38–126)
Anion gap: 11 (ref 5–15)
BUN: 15 mg/dL (ref 8–23)
CO2: 22 mmol/L (ref 22–32)
Calcium: 8.9 mg/dL (ref 8.9–10.3)
Chloride: 105 mmol/L (ref 98–111)
Creatinine, Ser: 0.77 mg/dL (ref 0.44–1.00)
GFR calc Af Amer: >60 mL/min (ref >60)
GFR calc non Af Amer: >60 mL/min (ref >60)
Glucose, Bld: 93 mg/dL (ref 70–99)
Potassium: 4.7 mmol/L (ref 3.5–5.1)
Sodium: 138 mmol/L (ref 135–145)
Total Bilirubin: 1 mg/dL (ref 0.3–1.2)
Total Protein: 8.7 g/dL — ABNORMAL HIGH (ref 6.5–8.1)

## 2019-04-22 LAB — CBC
HCT: 48.1 % — ABNORMAL HIGH (ref 36.0–46.0)
Hemoglobin: 15.3 g/dL — ABNORMAL HIGH (ref 12.0–15.0)
MCH: 26.2 pg (ref 26.0–34.0)
MCHC: 31.8 g/dL (ref 30.0–36.0)
MCV: 82.5 fL (ref 80.0–100.0)
Platelets: 351 10*3/uL (ref 150–400)
RBC: 5.83 MIL/uL — ABNORMAL HIGH (ref 3.87–5.11)
RDW: 14.9 % (ref 11.5–15.5)
WBC: 5.1 10*3/uL (ref 4.0–10.5)
nRBC: 0 % (ref 0.0–0.2)

## 2019-04-22 LAB — URINALYSIS, ROUTINE W REFLEX MICROSCOPIC
Glucose, UA: NEGATIVE mg/dL
Hgb urine dipstick: NEGATIVE
Leukocytes,Ua: NEGATIVE
Nitrite: NEGATIVE
Protein, ur: 100 mg/dL — AB
Specific Gravity, Urine: 1.025 (ref 1.005–1.030)
pH: 6 (ref 5.0–8.0)

## 2019-04-22 LAB — LIPASE, BLOOD: Lipase: 28 U/L (ref 11–51)

## 2019-04-22 MED ORDER — ONDANSETRON HCL 4 MG/2ML IJ SOLN
4.0000 mg | Freq: Once | INTRAMUSCULAR | Status: AC
  Administered 2019-04-22: 13:00:00 4 mg via INTRAVENOUS
  Filled 2019-04-22: qty 2

## 2019-04-22 MED ORDER — ONDANSETRON HCL 4 MG PO TABS: 4.0000 mg | ORAL_TABLET | Freq: Four times a day (QID) | ORAL | 0 refills | Status: AC

## 2019-04-22 MED ORDER — FENTANYL CITRATE (PF) 100 MCG/2ML IJ SOLN
50.0000 ug | Freq: Once | INTRAMUSCULAR | Status: AC
  Administered 2019-04-22: 13:00:00 50 ug via INTRAVENOUS
  Filled 2019-04-22: qty 2

## 2019-04-22 MED ORDER — SODIUM CHLORIDE 0.9 % IV BOLUS
1000.0000 mL | Freq: Once | INTRAVENOUS | Status: AC
  Administered 2019-04-22: 13:00:00 1000 mL via INTRAVENOUS

## 2019-04-22 MED ORDER — FENTANYL CITRATE (PF) 100 MCG/2ML IJ SOLN
50.0000 ug | Freq: Once | INTRAMUSCULAR | Status: AC
  Administered 2019-04-22: 15:00:00 50 ug via INTRAVENOUS
  Filled 2019-04-22: qty 2

## 2019-04-22 NOTE — ED Notes (Signed)
ELEVATED KNEE AND APPLIED HEAT PACK

## 2019-04-22 NOTE — ED Provider Notes (Addendum)
[facility] DEPT Provider Note   CSN: [id number] Arrival date & time: 04/22/19  1103    History   Chief Complaint Chief Complaint  Patient presents with  . Diarrhea    HPI Erin Wolf is a 63 y.o. female.     The history is provided by the patient.  Diarrhea Quality:  Watery Severity:  Mild Onset quality:  Gradual Duration:  1 week Timing:  Constant Progression:  Unchanged Relieved by:  Nothing Worsened by:  Nothing Associated symptoms: abdominal pain (cramps) and vomiting   Associated symptoms: no arthralgias, no chills, no recent cough, no diaphoresis, no fever, no headaches, no myalgias and no URI   Risk factors: no recent antibiotic use and no sick contacts     Past Medical History:  Diagnosis Date  . Anxiety   . Arthritis   . Chronic abdominal pain   . Chronic lower back pain   . Chronic nausea   . Depression   . Diverticulosis   . Fibromyalgia   . Frequency of urination   . GERD (gastroesophageal reflux disease)   . Headache   . Hematuria   . Hepatitis C antibody positive in blood    per pt told by pcp  . History of panic attacks   . History of syncope    hx recurrent syncope --- per epic domentation non-cardiac , orthostatic hypotension, anxiety, dehydration  . History of uterine fibroid   . HTN (hypertension), benign   . Hyperlipidemia 01/03/2012  . IBS (irritable bowel syndrome)   . Myalgia   . Seizure disorder ([place]) followed by pcp until new neurologist appr. in jan 2019   "started having them in my 20's" ,  petit mal ----  per pt last seizure one 2015  . SLE (systemic lupus erythematosus) [facility])    rheumatologist-- dr Gerilyn Nestle (consult 11-12-2016) having work-up done  . Ulcerative colitis    followed by dr Penelope Coop at [place]  . Wears glasses     Patient Active Problem List   Diagnosis Date Noted  . Chronic migraine 11/10/2018  . Nausea & vomiting 10/18/2016  . History of ulcerative colitis   . LLQ  abdominal pain 10/17/2016  . Syncope and collapse 06/29/2016  . Left-sided weakness 06/29/2016  . Facial twitching 06/29/2016  . IBS (irritable bowel syndrome) 06/29/2016  . Faintness   . Dehydration 05/09/2014  . GERD (gastroesophageal reflux disease) 05/09/2014  . Gastroenteritis 05/09/2014  . Chest pain, atypical 05/09/2014  . Chronic ulcerative colitis ([place]) 05/09/2014  . Acute gastroenteritis 05/09/2014  . Ulcerative colitis ([place]) 05/27/2013  . Hypokalemia 05/27/2013  . Anemia 01/04/2012  . Transaminitis 01/04/2012  . Diarrhea 01/04/2012  . Chest pain 01/03/2012  . SOB (shortness of breath) 01/03/2012  . HTN (hypertension), benign 01/03/2012  . Hx of migraines 01/03/2012  . Seizure ([place]) 01/03/2012  . Depression 01/03/2012  . Ulcerative colitis 01/03/2012  . Chronic back pain 01/03/2012  . 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 Hyperlipidemia 01/03/2012  . Hepatitis C 01/03/2012  . Fibroids 01/03/2012    Past Surgical History:  Procedure Laterality Date  . ABDOMINAL HYSTERECTOMY  1988  . CHOLECYSTECTOMY OPEN  1978  . COLONOSCOPY N/A 05/31/2013   Procedure: COLONOSCOPY;  Surgeon: Wonda Horner, MD;  [facility] ENDOSCOPY;  Service: Endoscopy;  Laterality: N/A;  . CYSTOSCOPY/RETROGRADE/URETEROSCOPY Bilateral 07/23/2017   Procedure: CYSTOSCOPY/RETROGRADE/URETEROSCOPY;  Surgeon: Ceasar Mons, MD;  Location: [facility];  Service: Urology;  Laterality: Bilateral;  . ESOPHAGOGASTRODUODENOSCOPY (EGD)  WITH PROPOFOL N/A 05/11/2014   Procedure: ESOPHAGOGASTRODUODENOSCOPY (EGD) WITH PROPOFOL;  Surgeon: Lear Ng, MD;  Location: [facility] ENDOSCOPY;  Service: Endoscopy;  Laterality: N/A;  egd first  . EUS N/A 06/21/2016   Procedure: ESOPHAGEAL ENDOSCOPIC ULTRASOUND (EUS) RADIAL;  Surgeon: Arta Silence, MD;  Location: [facility] ENDOSCOPY;  Service: Endoscopy;  Laterality: N/A;  . FLEXIBLE SIGMOIDOSCOPY N/A 05/11/2014   Procedure: FLEXIBLE SIGMOIDOSCOPY;  Surgeon:  Lear Ng, MD;  Location: [facility] ENDOSCOPY;  Service: Endoscopy;  Laterality: N/A;  . LAPAROTOMY W/ BILATERAL SALPINGOOPHORECTOMY  09-26-2003   dr Matthew Saras at [facility]  . LAPROSCOPY W/ LYSIS ADHESIONS  06-08-2003   dr Radene Knee [facility]  . TRANSTHORACIC ECHOCARDIOGRAM  04/29/2016   ef 60-65%,  grade 2 diastolic dysfunction/  trivial MR and TR  . TUBAL LIGATION Bilateral yrs ago     OB History   No obstetric history on file.      Home Medications    Prior to Admission medications   Medication Sig Start Date End Date Taking? Authorizing Provider  cyclobenzaprine (FLEXERIL) 10 MG tablet Take 10 mg by mouth at bedtime as needed for muscle spasms.  10/31/17  Yes [provider]  FLUoxetine (PROZAC) 20 MG capsule Take 20 mg by mouth daily.    Yes [provider]  furosemide (LASIX) 20 MG tablet Take 20 mg by mouth daily as needed for edema.    Yes [provider]  hydrOXYzine (ATARAX/VISTARIL) 25 MG tablet Take 25 mg by mouth at bedtime as needed for anxiety.    Yes [provider]  mesalamine (ROWASA) 4 g enema Place 4 g rectally daily as needed (for ulcerative colitis).   Yes [provider]  ondansetron (ZOFRAN ODT) 8 MG disintegrating tablet Take 1 tablet (8 mg total) by mouth every 8 (eight) hours as needed for nausea or vomiting. 06/06/17  Yes Jola Schmidt, MD  Oxycodone HCl 10 MG TABS Take 10 mg by mouth 3 (three) times daily as needed for pain. 05/21/17  Yes [provider]  Polyethyl Glycol-Propyl Glycol (SYSTANE OP) Place 1 drop into both eyes 3 (three) times daily as needed (dry eyes.).   Yes [provider]  triamterene-hydrochlorothiazide (MAXZIDE) 75-50 MG tablet Take 1 tablet by mouth daily.   Yes [provider]  APPLE CIDER VINEGAR PO Take 2 tablets by mouth 2 (two) times a day. [place] [employer], agricultural    [provider]  potassium chloride (K-DUR) 10 MEQ tablet Take 2 tablets (20 mEq total) by mouth  daily. Patient not taking: Reported on 04/13/2019 11/17/18   Blanchie Dessert, MD  predniSONE (DELTASONE) 20 MG tablet Take 2 tablets (40 mg total) by mouth daily. Patient not taking: Reported on 04/13/2019 11/17/18   Blanchie Dessert, MD  rizatriptan (MAXALT-MLT) 5 MG disintegrating tablet Take 1 tablet (5 mg total) by mouth as needed. May repeat in 2 hours if needed Patient not taking: Reported on 04/13/2019 11/10/18   Marcial Pacas, MD    Family History Family History  Problem Relation Age of Onset  . Alzheimer's disease Mother   . Hypertension Mother   . Colon cancer Brother   . Hypertension Brother   . Other Father        ruptured appendix  . Breast cancer Sister   . Hypertension Sister   . Hypertension Sister   . Memory loss Brother   . Hypertension Sister   . Heart disease Sister     Social History Social History  Tobacco Use  . Smoking status: Never Smoker  . Smokeless tobacco: Never Used  Substance Use Topics  . Alcohol use: Yes    Comment: very rare - special occasions  . Drug use: No     Allergies   Latex, Nsaids, Penicillins, Wellbutrin [bupropion hcl], Aspirin, Darvocet [propoxyphene n-acetaminophen], Isometheptene-dichloral-apap, Tape, Toradol [ketorolac tromethamine], Tylenol [acetaminophen], Milk-related compounds, Other, and Dilaudid [hydromorphone hcl]   Review of Systems Review of Systems  Constitutional: Negative for chills, diaphoresis and fever.  HENT: Negative for ear pain and sore throat.   Eyes: Negative for pain and visual disturbance.  Respiratory: Negative for cough and shortness of breath.   Cardiovascular: Negative for chest pain and palpitations.  Gastrointestinal: Positive for abdominal pain (cramps), diarrhea, nausea and vomiting.  Genitourinary: Negative for dysuria and hematuria.  Musculoskeletal: Negative for arthralgias, back pain and myalgias.  Skin: Negative for color change and rash.  Neurological: Positive for light-headedness.  Negative for seizures, syncope and headaches.  All other systems reviewed and are negative.    Physical Exam Updated Vital Signs BP 104/82   Pulse 90   Temp 98.9 F (37.2 C) (Oral)   Resp 18   SpO2 99%   Physical Exam Vitals signs and nursing note reviewed.  Constitutional:      General: She is not in acute distress.    Appearance: She is well-developed.  HENT:     Head: Normocephalic and atraumatic.     Mouth/Throat:     Mouth: Mucous membranes are moist.  Eyes:     Extraocular Movements: Extraocular movements intact.     Conjunctiva/sclera: Conjunctivae normal.     Pupils: Pupils are equal, round, and reactive to light.  Neck:     Musculoskeletal: Normal range of motion and neck supple.  Cardiovascular:     Rate and Rhythm: Normal rate and regular rhythm.     Heart sounds: No murmur.  Pulmonary:     Effort: Pulmonary effort is normal. No respiratory distress.     Breath sounds: Normal breath sounds.  Abdominal:     General: Abdomen is flat. There is no distension.     Palpations: Abdomen is soft.     Tenderness: There is no abdominal tenderness.     Hernia: No hernia is present.  Skin:    General: Skin is warm and dry.     Capillary Refill: Capillary refill takes less than 2 seconds.  Neurological:     General: No focal deficit present.     Mental Status: She is alert.     Cranial Nerves: No cranial nerve deficit.     Sensory: No sensory deficit.     Motor: No weakness.     Coordination: Coordination normal.  Psychiatric:        Mood and Affect: Mood normal.      ED Treatments / Results  Labs (all labs ordered are listed, but only abnormal results are displayed) Labs Reviewed  CBC - Abnormal; Notable for the following components:      Result Value   RBC 5.83 (*)    Hemoglobin 15.3 (*)    HCT 48.1 (*)    All other components within normal limits  URINALYSIS, ROUTINE W REFLEX MICROSCOPIC - Abnormal; Notable for the following components:   APPearance  TURBID (*)    Bilirubin Urine SMALL (*)    Ketones, ur TRACE (*)    Protein, ur 100 (*)    All other components within normal limits  URINALYSIS, MICROSCOPIC (REFLEX) -  Abnormal; Notable for the following components:   Bacteria, UA MANY (*)    All other components within normal limits  COMPREHENSIVE METABOLIC PANEL - Abnormal; Notable for the following components:   Total Protein 8.7 (*)    All other components within normal limits  LIPASE, BLOOD    EKG None  Radiology No results found.  Procedures Procedures (including critical care time)  Medications Ordered in ED Medications  ondansetron (ZOFRAN) injection 4 mg (4 mg Intravenous Given 04/22/19 1300)  sodium chloride 0.9 % bolus 1,000 mL (0 mLs Intravenous Stopped 04/22/19 1538)  fentaNYL (SUBLIMAZE) injection 50 mcg (50 mcg Intravenous Given 04/22/19 1327)  fentaNYL (SUBLIMAZE) injection 50 mcg (50 mcg Intravenous Given 04/22/19 1509)     Initial Impression / Assessment and Plan / ED Course  I have reviewed the triage vital signs and the nursing notes.  Pertinent labs & imaging results that were available during my care of the patient were reviewed by me and considered in my medical decision making (see chart for details).     Erin Wolf is a 63 year old female history of anxiety, IBS, fibromyalgia who presents to the ED with nausea, vomiting, diarrhea.  History of the same.  Normal vitals.  No fever.  Patient feels as if she is having IBS flare.  No abdominal tenderness on exam.  Has intermittent abdominal cramping followed by diarrhea than improvement of symptoms.  Patient nervous about knee surgery she is about to have.  Patient overall is well-appearing.  No focal abdominal pain.  Doubt surgical process within the abdomen.  Will provide symptomatic care with IV fluids, IV Zofran.  Will evaluate for electrolyte abnormality, AKI.  Patient requesting COVID swab as she is supposed to get 1 for surgery but unable to go today  due to illness.  Will swab with outpatient test at time of discharge.  Patient with no significant leukocytosis, anemia, electrolyte abnormality.  No AKI.  Patient felt better after IV fluids.  Will prescribe Zofran. Given return precautions discharged from ED in good condition.  This chart was dictated using voice recognition software.  Despite best efforts to proofread,  errors can occur which can change the documentation meaning.    Final Clinical Impressions(s) / ED Diagnoses   Final diagnoses:  Nausea vomiting and diarrhea    ED Discharge Orders    None       Lennice Sites, DO 04/22/19 [place], [place], DO 04/22/19 1546

## 2019-04-22 NOTE — ED Notes (Signed)
Pt ambulated to the bathroom to provide a urine sample.

## 2019-04-22 NOTE — ED Triage Notes (Signed)
Pt c/o being dizzy, lightheaded, n/v/d all week. Reports that she is supposed to have her Covid test for her knee surgery tomorrow, but when talked to doctor, was told to go to ED bc some of her symptoms are of the coronavirus.

## 2019-04-22 NOTE — Discharge Instructions (Addendum)
Continue zofran for nausea. Return if symptoms worsen.

## 2019-04-23 ENCOUNTER — Other Ambulatory Visit (HOSPITAL_COMMUNITY)
Admission: RE | Admit: 2019-04-23 | Discharge: 2019-04-23 | Disposition: A | Discharge status: Home / Self Care | Payer: Medicare HMO | Source: Ambulatory Visit | Attending: Otolaryngology | Admitting: Otolaryngology

## 2019-04-23 DIAGNOSIS — I10 Essential (primary) hypertension: Secondary | ICD-10-CM | POA: Diagnosis not present

## 2019-04-23 DIAGNOSIS — F329 Major depressive disorder, single episode, unspecified: Secondary | ICD-10-CM | POA: Diagnosis not present

## 2019-04-23 DIAGNOSIS — M1712 Unilateral primary osteoarthritis, left knee: Secondary | ICD-10-CM | POA: Diagnosis not present

## 2019-04-23 DIAGNOSIS — Z79899 Other long term (current) drug therapy: Secondary | ICD-10-CM | POA: Diagnosis not present

## 2019-04-23 DIAGNOSIS — Z1159 Encounter for screening for other viral diseases: Secondary | ICD-10-CM | POA: Diagnosis not present

## 2019-04-23 DIAGNOSIS — E785 Hyperlipidemia, unspecified: Secondary | ICD-10-CM | POA: Diagnosis not present

## 2019-04-23 DIAGNOSIS — Z01812 Encounter for preprocedural laboratory examination: Secondary | ICD-10-CM | POA: Diagnosis not present

## 2019-04-24 LAB — SARS CORONAVIRUS 2 (TAT 6-24 HRS): SARS Coronavirus 2: NEGATIVE

## 2019-04-26 MED ORDER — VANCOMYCIN HCL 10 G IV SOLR
1500.0000 mg | INTRAVENOUS | Status: DC
  Filled 2019-04-26: qty 1500

## 2019-04-27 ENCOUNTER — Ambulatory Visit (HOSPITAL_COMMUNITY): Admission: RE | Admit: 2019-04-27 | Payer: Medicare HMO | Source: Home / Self Care | Admitting: Orthopedic Surgery

## 2019-04-27 ENCOUNTER — Encounter (HOSPITAL_COMMUNITY): Admission: RE | Payer: Self-pay | Source: Home / Self Care

## 2019-04-27 LAB — TYPE AND SCREEN
ABO/RH(D): O POS
Antibody Screen: NEGATIVE

## 2019-04-27 SURGERY — ARTHROPLASTY, KNEE, TOTAL
Anesthesia: Spinal | Laterality: Left

## 2019-04-30 DIAGNOSIS — G8929 Other chronic pain: Secondary | ICD-10-CM | POA: Diagnosis not present

## 2019-04-30 DIAGNOSIS — F419 Anxiety disorder, unspecified: Secondary | ICD-10-CM | POA: Diagnosis not present

## 2019-04-30 DIAGNOSIS — M1712 Unilateral primary osteoarthritis, left knee: Secondary | ICD-10-CM | POA: Diagnosis not present

## 2019-04-30 DIAGNOSIS — K5190 Ulcerative colitis, unspecified, without complications: Secondary | ICD-10-CM | POA: Diagnosis not present

## 2019-04-30 DIAGNOSIS — I10 Essential (primary) hypertension: Secondary | ICD-10-CM | POA: Diagnosis not present

## 2019-05-05 DIAGNOSIS — M1712 Unilateral primary osteoarthritis, left knee: Secondary | ICD-10-CM | POA: Diagnosis not present

## 2019-05-05 DIAGNOSIS — M25562 Pain in left knee: Secondary | ICD-10-CM | POA: Diagnosis not present

## 2019-05-19 DIAGNOSIS — R112 Nausea with vomiting, unspecified: Secondary | ICD-10-CM | POA: Diagnosis not present

## 2019-05-19 DIAGNOSIS — K5190 Ulcerative colitis, unspecified, without complications: Secondary | ICD-10-CM | POA: Diagnosis not present

## 2019-05-19 DIAGNOSIS — R197 Diarrhea, unspecified: Secondary | ICD-10-CM | POA: Diagnosis not present

## 2019-05-19 DIAGNOSIS — R1084 Generalized abdominal pain: Secondary | ICD-10-CM | POA: Diagnosis not present

## 2019-06-04 DIAGNOSIS — Z1159 Encounter for screening for other viral diseases: Secondary | ICD-10-CM | POA: Diagnosis not present

## 2019-06-09 DIAGNOSIS — K5150 Left sided colitis without complications: Secondary | ICD-10-CM | POA: Diagnosis not present

## 2019-06-09 DIAGNOSIS — K210 Gastro-esophageal reflux disease with esophagitis: Secondary | ICD-10-CM | POA: Diagnosis not present

## 2019-06-09 DIAGNOSIS — R112 Nausea with vomiting, unspecified: Secondary | ICD-10-CM | POA: Diagnosis not present

## 2019-06-09 DIAGNOSIS — K5289 Other specified noninfective gastroenteritis and colitis: Secondary | ICD-10-CM | POA: Diagnosis not present

## 2019-06-11 DIAGNOSIS — K5289 Other specified noninfective gastroenteritis and colitis: Secondary | ICD-10-CM | POA: Diagnosis not present

## 2019-07-13 ENCOUNTER — Emergency Department (HOSPITAL_COMMUNITY): Payer: Medicare HMO

## 2019-07-13 ENCOUNTER — Encounter (HOSPITAL_COMMUNITY): Payer: Self-pay

## 2019-07-13 ENCOUNTER — Other Ambulatory Visit: Payer: Self-pay

## 2019-07-13 ENCOUNTER — Emergency Department (HOSPITAL_COMMUNITY)
Admission: EM | Admit: 2019-07-13 | Discharge: 2019-07-13 | Disposition: A | Discharge status: Home / Self Care | Payer: Medicare HMO | Attending: Emergency Medicine | Admitting: Emergency Medicine

## 2019-07-13 DIAGNOSIS — M5412 Radiculopathy, cervical region: Secondary | ICD-10-CM | POA: Insufficient documentation

## 2019-07-13 DIAGNOSIS — R002 Palpitations: Secondary | ICD-10-CM | POA: Diagnosis not present

## 2019-07-13 DIAGNOSIS — Z79899 Other long term (current) drug therapy: Secondary | ICD-10-CM | POA: Insufficient documentation

## 2019-07-13 DIAGNOSIS — Z91040 Latex allergy status: Secondary | ICD-10-CM | POA: Insufficient documentation

## 2019-07-13 DIAGNOSIS — R0789 Other chest pain: Secondary | ICD-10-CM | POA: Insufficient documentation

## 2019-07-13 DIAGNOSIS — R079 Chest pain, unspecified: Secondary | ICD-10-CM | POA: Diagnosis not present

## 2019-07-13 DIAGNOSIS — I10 Essential (primary) hypertension: Secondary | ICD-10-CM | POA: Insufficient documentation

## 2019-07-13 LAB — CBC
HCT: 42.9 % (ref 36.0–46.0)
Hemoglobin: 13.5 g/dL (ref 12.0–15.0)
MCH: 26.6 pg (ref 26.0–34.0)
MCHC: 31.5 g/dL (ref 30.0–36.0)
MCV: 84.6 fL (ref 80.0–100.0)
Platelets: 278 10*3/uL (ref 150–400)
RBC: 5.07 MIL/uL (ref 3.87–5.11)
RDW: 15.2 % (ref 11.5–15.5)
WBC: 4.1 10*3/uL (ref 4.0–10.5)
nRBC: 0 % (ref 0.0–0.2)

## 2019-07-13 LAB — BASIC METABOLIC PANEL
Anion gap: 11 (ref 5–15)
BUN: 7 mg/dL — ABNORMAL LOW (ref 8–23)
CO2: 22 mmol/L (ref 22–32)
Calcium: 9.5 mg/dL (ref 8.9–10.3)
Chloride: 105 mmol/L (ref 98–111)
Creatinine, Ser: 0.5 mg/dL (ref 0.44–1.00)
GFR calc Af Amer: >60 mL/min (ref >60)
GFR calc non Af Amer: >60 mL/min (ref >60)
Glucose, Bld: 103 mg/dL — ABNORMAL HIGH (ref 70–99)
Potassium: 3.6 mmol/L (ref 3.5–5.1)
Sodium: 138 mmol/L (ref 135–145)

## 2019-07-13 LAB — TROPONIN I (HIGH SENSITIVITY)
Troponin I (High Sensitivity): 4 ng/L (ref <18)
Troponin I (High Sensitivity): 4 ng/L (ref <18)

## 2019-07-13 MED ORDER — OXYCODONE HCL 5 MG PO TABS
10.0000 mg | ORAL_TABLET | Freq: Once | ORAL | Status: AC
  Administered 2019-07-13: 18:00:00 10 mg via ORAL
  Filled 2019-07-13: qty 2

## 2019-07-13 MED ORDER — SODIUM CHLORIDE 0.9% FLUSH: 3.0000 mL | Freq: Once | INTRAVENOUS | Status: DC

## 2019-07-13 NOTE — ED Triage Notes (Signed)
Pt states left sided and middle chest pain since Sunday. Pt c/o right arm pain and hand numbness.

## 2019-07-13 NOTE — ED Provider Notes (Signed)
[facility] DEPT Provider Note   CSN: [id number] Arrival date & time: 07/13/19  1504     History   Chief Complaint Chief Complaint  Patient presents with   Chest Pain    HPI Erin Wolf is a 63 y.o. female.     63yo F w/ PMH including HTN, HLD, seizures, IBS, fibromyalgia, UC, anxiety, SLE who p/w R arm pain and chest pain. 2 days ago while at rest, she started having constant R forearm pain and tingling in the hand. Pain also in R neck/shoulder that goes down arm. She has had intermittent palpitations and constant, mild chest heaviness since 2 days ago. CP is non-exertional and nothing makes it better or worse. She reports associated nausea, no SOB. No F, V/D, recent travel, cough, or sick contacts.   FH: sister w/ CAD s/p stenting, brother with heart disease, mother with unknown heart disease.   The history is provided by the patient.  Chest Pain   Past Medical History:  Diagnosis Date   Anxiety    Arthritis    Chronic abdominal pain    Chronic lower back pain    Chronic nausea    Depression    Diverticulosis    Fibromyalgia    Frequency of urination    GERD (gastroesophageal reflux disease)    Headache    Hematuria    Hepatitis C antibody positive in blood    per pt told by pcp   History of panic attacks    History of syncope    hx recurrent syncope --- per epic domentation non-cardiac , orthostatic hypotension, anxiety, dehydration   History of uterine fibroid    HTN (hypertension), benign    Hyperlipidemia 01/03/2012   IBS (irritable bowel syndrome)    Myalgia    Seizure disorder ([place]) followed by pcp until new neurologist appr. in jan 2019   "started having them in my 20's" ,  petit mal ----  per pt last seizure one 2015   SLE (systemic lupus erythematosus) [facility])    rheumatologist-- dr Gerilyn Nestle (consult 11-12-2016) having work-up done   Ulcerative colitis    followed by dr Penelope Coop at [place]    Wears glasses     Patient Active Problem List   Diagnosis Date Noted   Chronic migraine 11/10/2018   Nausea & vomiting 10/18/2016   History of ulcerative colitis    LLQ abdominal pain 10/17/2016   Syncope and collapse 06/29/2016   Left-sided weakness 06/29/2016   Facial twitching 06/29/2016   IBS (irritable bowel syndrome) 06/29/2016   Faintness    Dehydration 05/09/2014   GERD (gastroesophageal reflux disease) 05/09/2014   Gastroenteritis 05/09/2014   Chest pain, atypical 05/09/2014   Chronic ulcerative colitis ([place]) 05/09/2014   Acute gastroenteritis 05/09/2014   Ulcerative colitis ([place]) 05/27/2013   Hypokalemia 05/27/2013   Anemia 01/04/2012   Transaminitis 01/04/2012   Diarrhea 01/04/2012   Chest pain 01/03/2012   SOB (shortness of breath) 01/03/2012   HTN (hypertension), benign 01/03/2012   Hx of migraines 01/03/2012   Seizure ([place]) 01/03/2012   Depression 01/03/2012   Ulcerative colitis 01/03/2012   Chronic back pain 01/03/2012   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   Hyperlipidemia 01/03/2012   Hepatitis C 01/03/2012   Fibroids 01/03/2012    Past Surgical History:  Procedure Laterality Date   [place]   COLONOSCOPY N/A 05/31/2013   Procedure: COLONOSCOPY;  Surgeon: Wonda Horner, MD;  Location: [facility]  ENDOSCOPY;  Service: Endoscopy;  Laterality: N/A;   CYSTOSCOPY/RETROGRADE/URETEROSCOPY Bilateral 07/23/2017   Procedure: CYSTOSCOPY/RETROGRADE/URETEROSCOPY;  Surgeon: Ceasar Mons, MD;  Location: [facility];  Service: Urology;  Laterality: Bilateral;   ESOPHAGOGASTRODUODENOSCOPY (EGD) WITH PROPOFOL N/A 05/11/2014   Procedure: ESOPHAGOGASTRODUODENOSCOPY (EGD) WITH PROPOFOL;  Surgeon: Lear Ng, MD;  Location: [facility] ENDOSCOPY;  Service: Endoscopy;  Laterality: N/A;  egd first   EUS N/A 06/21/2016   Procedure: ESOPHAGEAL ENDOSCOPIC ULTRASOUND (EUS) RADIAL;   Surgeon: Arta Silence, MD;  Location: [facility] ENDOSCOPY;  Service: Endoscopy;  Laterality: N/A;   FLEXIBLE SIGMOIDOSCOPY N/A 05/11/2014   Procedure: FLEXIBLE SIGMOIDOSCOPY;  Surgeon: Lear Ng, MD;  Location: [facility] ENDOSCOPY;  Service: Endoscopy;  Laterality: N/A;   LAPAROTOMY W/ BILATERAL SALPINGOOPHORECTOMY  09-26-2003   dr Matthew Saras at [place]  06-08-2003   dr Radene Knee [place] ECHOCARDIOGRAM  04/29/2016   ef 60-65%,  grade 2 diastolic dysfunction/  trivial MR and TR   TUBAL LIGATION Bilateral yrs ago     OB History   No obstetric history on file.      Home Medications    Prior to Admission medications   Medication Sig Start Date End Date Taking? Authorizing Provider  diazepam (VALIUM) 5 MG tablet Take 5 mg by mouth 2 (two) times daily as needed. 05/14/19  Yes [provider]  esomeprazole (NEXIUM) 40 MG capsule Take 40 mg by mouth daily before breakfast. 06/10/19  Yes [provider]  FLUoxetine (PROZAC) 20 MG capsule Take 20 mg by mouth daily.    Yes [provider]  furosemide (LASIX) 20 MG tablet Take 20 mg by mouth daily as needed for edema.    Yes [provider]  hydrOXYzine (ATARAX/VISTARIL) 25 MG tablet Take 25 mg by mouth at bedtime as needed for anxiety.    Yes [provider]  Multiple Vitamins-Minerals (WOMENS MULTI PO) Take 1 tablet by mouth daily.   Yes [provider]  Oxycodone HCl 10 MG TABS Take 10 mg by mouth 3 (three) times daily as needed for pain. 05/21/17  Yes [provider]  Polyethyl Glycol-Propyl Glycol (SYSTANE OP) Place 1 drop into both eyes 3 (three) times daily as needed (dry eyes.).   Yes [provider]  ondansetron (ZOFRAN ODT) 8 MG disintegrating tablet Take 1 tablet (8 mg total) by mouth every 8 (eight) hours as needed for nausea or vomiting. Patient not taking: Reported on 07/13/2019 06/06/17   Jola Schmidt, MD  potassium chloride (K-DUR)  10 MEQ tablet Take 2 tablets (20 mEq total) by mouth daily. Patient not taking: Reported on 04/13/2019 11/17/18   Blanchie Dessert, MD  predniSONE (DELTASONE) 20 MG tablet Take 2 tablets (40 mg total) by mouth daily. Patient not taking: Reported on 04/13/2019 11/17/18   Blanchie Dessert, MD  rizatriptan (MAXALT-MLT) 5 MG disintegrating tablet Take 1 tablet (5 mg total) by mouth as needed. May repeat in 2 hours if needed Patient not taking: Reported on 04/13/2019 11/10/18   Marcial Pacas, MD    Family History Family History  Problem Relation Age of Onset   Alzheimer's disease Mother    Hypertension Mother    Colon cancer Brother    Hypertension Brother    Other Father        ruptured appendix   Breast cancer Sister    Hypertension Sister    Hypertension Sister    Memory loss Brother    Hypertension Sister  Heart disease Sister     Social History Social History   Tobacco Use   Smoking status: Never Smoker   Smokeless tobacco: Never Used  Substance Use Topics   Alcohol use: Yes    Comment: very rare - special occasions   Drug use: No     Allergies   Latex, Nsaids, Penicillins, Wellbutrin [bupropion hcl], Aspirin, Darvocet [propoxyphene n-acetaminophen], Isometheptene-dichloral-apap, Tape, Toradol [ketorolac tromethamine], Tylenol [acetaminophen], Milk-related compounds, Other, and Dilaudid [hydromorphone hcl]   Review of Systems Review of Systems  Cardiovascular: Positive for chest pain.  All other systems reviewed and are negative except that which was mentioned in HPI    Physical Exam Updated Vital Signs BP (!) 169/91    Pulse 90    Temp 98.3 F (36.8 C) (Oral)    Resp 16    Wt 104.3 kg    SpO2 100%    BMI 39.48 kg/m   Physical Exam Vitals signs and nursing note reviewed.  Constitutional:      General: She is not in acute distress.    Appearance: She is well-developed.  HENT:     Head: Normocephalic and atraumatic.  Eyes:     Conjunctiva/sclera:  Conjunctivae normal.  Neck:     Musculoskeletal: Neck supple.  Cardiovascular:     Rate and Rhythm: Normal rate and regular rhythm.     Heart sounds: Normal heart sounds. No murmur.  Pulmonary:     Effort: Pulmonary effort is normal.     Breath sounds: Normal breath sounds.  Abdominal:     General: Bowel sounds are normal. There is no distension.     Palpations: Abdomen is soft.     Tenderness: There is no abdominal tenderness.  Musculoskeletal: Normal range of motion.     Comments: Mild tenderness along right cervical paraspinal muscles and into her right trapezius muscle and posterior shoulder  Skin:    General: Skin is warm and dry.  Neurological:     Mental Status: She is alert and oriented to person, place, and time.     Motor: No weakness.     Comments: Fluent speech 5/5 strength and normal sensation bilateral upper extremities  Psychiatric:        Judgment: Judgment normal.      ED Treatments / Results  Labs (all labs ordered are listed, but only abnormal results are displayed) Labs Reviewed  BASIC METABOLIC PANEL - Abnormal; Notable for the following components:      Result Value   Glucose, Bld 103 (*)    BUN 7 (*)    All other components within normal limits  CBC  TROPONIN I (HIGH SENSITIVITY)  TROPONIN I (HIGH SENSITIVITY)    EKG EKG Interpretation  Date/Time:  Tuesday July 13 2019 18:13:56 EDT Ventricular Rate:  86 PR Interval:    QRS Duration: 84 QT Interval:  372 QTC Calculation: 445 R Axis:   35 Text Interpretation:  Sinus rhythm No significant change since last tracing Confirmed by Theotis Burrow [id number]) on 07/13/2019 7:04:32 PM   Radiology Dg Chest 2 View  Result Date: 07/13/2019 CLINICAL DATA:  Chest pain since 07/10/2019. EXAM: CHEST - 2 VIEW COMPARISON:  Single-view of the chest 11/17/2018 and PA and lateral chest 12/31/2017. FINDINGS: The lungs are clear. Heart size is normal. There is no pneumothorax or pleural fluid. No acute or  focal bony abnormality. IMPRESSION: Negative chest. Electronically Signed   By: Inge Rise M.D.   On: 07/13/2019 15:54    Procedures Procedures (  including critical care time)  Medications Ordered in ED Medications  sodium chloride flush (NS) 0.9 % injection 3 mL (0 mLs Intravenous Hold 07/13/19 1757)  oxyCODONE (Oxy IR/ROXICODONE) immediate release tablet 10 mg (10 mg Oral Given 07/13/19 1806)     Initial Impression / Assessment and Plan / ED Course  I have reviewed the triage vital signs and the nursing notes.  Pertinent labs & imaging results that were available during my care of the patient were reviewed by me and considered in my medical decision making (see chart for details).       EKG unremarkable.  Patient seem to predominantly be concerned about right arm pain and hand tingling.  Her description of symptoms suggest cervical radiculopathy especially given that pain sometimes begins in neck and radiates down the shoulder.  She has normal strength and denies any problems with weakness therefore I do not feel she needs any emergent imaging for this.  I have discussed supportive measures and PCP follow-up for physical therapy referral and/or advanced imaging.  Regarding her chest pain, symptoms are very atypical for ACS and have been constant, nonexertional for 2 days.  She has no associated shortness of breath, pleuritic chest pain, leg swelling, recent travel, tachycardia, or hypoxia to suggest PE and no underlying risk factors for clotting.  I have recommended that she follow closely with her PCP regarding these symptoms and I have extensively reviewed return precautions regarding her chest pain.  She voiced understanding.  Final Clinical Impressions(s) / ED Diagnoses   Final diagnoses:  Cervical radiculopathy  Atypical chest pain    ED Discharge Orders    None       Amira Podolak, Wenda Over[place], MD 07/13/19 2012

## 2019-07-13 NOTE — ED Notes (Signed)
Pt in wheelchair to xray, then room 15

## 2019-08-25 DIAGNOSIS — M545 Low back pain: Secondary | ICD-10-CM | POA: Diagnosis not present

## 2019-08-25 DIAGNOSIS — G43001 Migraine without aura, not intractable, with status migrainosus: Secondary | ICD-10-CM | POA: Diagnosis not present

## 2019-08-25 DIAGNOSIS — M1712 Unilateral primary osteoarthritis, left knee: Secondary | ICD-10-CM | POA: Diagnosis not present

## 2019-08-25 DIAGNOSIS — E7800 Pure hypercholesterolemia, unspecified: Secondary | ICD-10-CM | POA: Diagnosis not present

## 2019-08-25 DIAGNOSIS — F324 Major depressive disorder, single episode, in partial remission: Secondary | ICD-10-CM | POA: Diagnosis not present

## 2019-08-25 DIAGNOSIS — F419 Anxiety disorder, unspecified: Secondary | ICD-10-CM | POA: Diagnosis not present

## 2019-08-25 DIAGNOSIS — I10 Essential (primary) hypertension: Secondary | ICD-10-CM | POA: Diagnosis not present

## 2019-08-25 DIAGNOSIS — E119 Type 2 diabetes mellitus without complications: Secondary | ICD-10-CM | POA: Diagnosis not present

## 2019-08-25 DIAGNOSIS — K5190 Ulcerative colitis, unspecified, without complications: Secondary | ICD-10-CM | POA: Diagnosis not present

## 2019-10-05 DIAGNOSIS — E7800 Pure hypercholesterolemia, unspecified: Secondary | ICD-10-CM | POA: Diagnosis not present

## 2019-10-05 DIAGNOSIS — R319 Hematuria, unspecified: Secondary | ICD-10-CM | POA: Diagnosis not present

## 2019-10-05 DIAGNOSIS — I10 Essential (primary) hypertension: Secondary | ICD-10-CM | POA: Diagnosis not present

## 2019-10-05 DIAGNOSIS — E119 Type 2 diabetes mellitus without complications: Secondary | ICD-10-CM | POA: Diagnosis not present

## 2019-10-13 DIAGNOSIS — R05 Cough: Secondary | ICD-10-CM | POA: Diagnosis not present

## 2019-10-13 DIAGNOSIS — J069 Acute upper respiratory infection, unspecified: Secondary | ICD-10-CM | POA: Diagnosis not present

## 2019-11-19 DIAGNOSIS — E7800 Pure hypercholesterolemia, unspecified: Secondary | ICD-10-CM | POA: Diagnosis not present

## 2019-11-19 DIAGNOSIS — M150 Primary generalized (osteo)arthritis: Secondary | ICD-10-CM | POA: Diagnosis not present

## 2019-11-19 DIAGNOSIS — M1712 Unilateral primary osteoarthritis, left knee: Secondary | ICD-10-CM | POA: Diagnosis not present

## 2019-11-19 DIAGNOSIS — E119 Type 2 diabetes mellitus without complications: Secondary | ICD-10-CM | POA: Diagnosis not present

## 2019-11-19 DIAGNOSIS — F324 Major depressive disorder, single episode, in partial remission: Secondary | ICD-10-CM | POA: Diagnosis not present

## 2019-11-19 DIAGNOSIS — H269 Unspecified cataract: Secondary | ICD-10-CM | POA: Diagnosis not present

## 2019-11-19 DIAGNOSIS — I10 Essential (primary) hypertension: Secondary | ICD-10-CM | POA: Diagnosis not present

## 2019-12-16 DIAGNOSIS — H269 Unspecified cataract: Secondary | ICD-10-CM | POA: Diagnosis not present

## 2019-12-16 DIAGNOSIS — M150 Primary generalized (osteo)arthritis: Secondary | ICD-10-CM | POA: Diagnosis not present

## 2019-12-16 DIAGNOSIS — M1712 Unilateral primary osteoarthritis, left knee: Secondary | ICD-10-CM | POA: Diagnosis not present

## 2019-12-16 DIAGNOSIS — I10 Essential (primary) hypertension: Secondary | ICD-10-CM | POA: Diagnosis not present

## 2019-12-16 DIAGNOSIS — E7800 Pure hypercholesterolemia, unspecified: Secondary | ICD-10-CM | POA: Diagnosis not present

## 2019-12-16 DIAGNOSIS — E119 Type 2 diabetes mellitus without complications: Secondary | ICD-10-CM | POA: Diagnosis not present

## 2019-12-16 DIAGNOSIS — F324 Major depressive disorder, single episode, in partial remission: Secondary | ICD-10-CM | POA: Diagnosis not present

## 2020-01-12 DIAGNOSIS — M1712 Unilateral primary osteoarthritis, left knee: Secondary | ICD-10-CM | POA: Diagnosis not present

## 2020-01-12 DIAGNOSIS — I10 Essential (primary) hypertension: Secondary | ICD-10-CM | POA: Diagnosis not present

## 2020-01-12 DIAGNOSIS — E119 Type 2 diabetes mellitus without complications: Secondary | ICD-10-CM | POA: Diagnosis not present

## 2020-01-12 DIAGNOSIS — G43001 Migraine without aura, not intractable, with status migrainosus: Secondary | ICD-10-CM | POA: Diagnosis not present

## 2020-01-12 DIAGNOSIS — H269 Unspecified cataract: Secondary | ICD-10-CM | POA: Diagnosis not present

## 2020-01-12 DIAGNOSIS — E7800 Pure hypercholesterolemia, unspecified: Secondary | ICD-10-CM | POA: Diagnosis not present

## 2020-01-12 DIAGNOSIS — F324 Major depressive disorder, single episode, in partial remission: Secondary | ICD-10-CM | POA: Diagnosis not present

## 2020-01-12 DIAGNOSIS — M150 Primary generalized (osteo)arthritis: Secondary | ICD-10-CM | POA: Diagnosis not present

## 2020-01-31 DIAGNOSIS — I10 Essential (primary) hypertension: Secondary | ICD-10-CM | POA: Diagnosis not present

## 2020-01-31 DIAGNOSIS — F324 Major depressive disorder, single episode, in partial remission: Secondary | ICD-10-CM | POA: Diagnosis not present

## 2020-01-31 DIAGNOSIS — M150 Primary generalized (osteo)arthritis: Secondary | ICD-10-CM | POA: Diagnosis not present

## 2020-01-31 DIAGNOSIS — E7800 Pure hypercholesterolemia, unspecified: Secondary | ICD-10-CM | POA: Diagnosis not present

## 2020-01-31 DIAGNOSIS — M1712 Unilateral primary osteoarthritis, left knee: Secondary | ICD-10-CM | POA: Diagnosis not present

## 2020-01-31 DIAGNOSIS — H269 Unspecified cataract: Secondary | ICD-10-CM | POA: Diagnosis not present

## 2020-01-31 DIAGNOSIS — E119 Type 2 diabetes mellitus without complications: Secondary | ICD-10-CM | POA: Diagnosis not present

## 2020-01-31 DIAGNOSIS — G43001 Migraine without aura, not intractable, with status migrainosus: Secondary | ICD-10-CM | POA: Diagnosis not present

## 2020-02-18 DIAGNOSIS — R519 Headache, unspecified: Secondary | ICD-10-CM | POA: Diagnosis not present

## 2020-02-18 DIAGNOSIS — H5712 Ocular pain, left eye: Secondary | ICD-10-CM | POA: Diagnosis not present

## 2020-02-18 DIAGNOSIS — H04123 Dry eye syndrome of bilateral lacrimal glands: Secondary | ICD-10-CM | POA: Diagnosis not present

## 2020-02-18 DIAGNOSIS — H2513 Age-related nuclear cataract, bilateral: Secondary | ICD-10-CM | POA: Diagnosis not present

## 2020-02-21 DIAGNOSIS — Z0100 Encounter for examination of eyes and vision without abnormal findings: Secondary | ICD-10-CM | POA: Diagnosis not present

## 2020-03-07 DIAGNOSIS — R569 Unspecified convulsions: Secondary | ICD-10-CM | POA: Diagnosis not present

## 2020-03-07 DIAGNOSIS — E7800 Pure hypercholesterolemia, unspecified: Secondary | ICD-10-CM | POA: Diagnosis not present

## 2020-03-07 DIAGNOSIS — G8929 Other chronic pain: Secondary | ICD-10-CM | POA: Diagnosis not present

## 2020-03-07 DIAGNOSIS — F324 Major depressive disorder, single episode, in partial remission: Secondary | ICD-10-CM | POA: Diagnosis not present

## 2020-03-07 DIAGNOSIS — F419 Anxiety disorder, unspecified: Secondary | ICD-10-CM | POA: Diagnosis not present

## 2020-03-07 DIAGNOSIS — I10 Essential (primary) hypertension: Secondary | ICD-10-CM | POA: Diagnosis not present

## 2020-03-07 DIAGNOSIS — K5190 Ulcerative colitis, unspecified, without complications: Secondary | ICD-10-CM | POA: Diagnosis not present

## 2020-03-07 DIAGNOSIS — E119 Type 2 diabetes mellitus without complications: Secondary | ICD-10-CM | POA: Diagnosis not present

## 2020-03-07 DIAGNOSIS — R609 Edema, unspecified: Secondary | ICD-10-CM | POA: Diagnosis not present

## 2020-03-13 DIAGNOSIS — G43001 Migraine without aura, not intractable, with status migrainosus: Secondary | ICD-10-CM | POA: Diagnosis not present

## 2020-03-13 DIAGNOSIS — M150 Primary generalized (osteo)arthritis: Secondary | ICD-10-CM | POA: Diagnosis not present

## 2020-03-13 DIAGNOSIS — M1712 Unilateral primary osteoarthritis, left knee: Secondary | ICD-10-CM | POA: Diagnosis not present

## 2020-03-13 DIAGNOSIS — F324 Major depressive disorder, single episode, in partial remission: Secondary | ICD-10-CM | POA: Diagnosis not present

## 2020-03-13 DIAGNOSIS — E7800 Pure hypercholesterolemia, unspecified: Secondary | ICD-10-CM | POA: Diagnosis not present

## 2020-03-13 DIAGNOSIS — E119 Type 2 diabetes mellitus without complications: Secondary | ICD-10-CM | POA: Diagnosis not present

## 2020-03-13 DIAGNOSIS — I10 Essential (primary) hypertension: Secondary | ICD-10-CM | POA: Diagnosis not present

## 2020-03-13 DIAGNOSIS — H269 Unspecified cataract: Secondary | ICD-10-CM | POA: Diagnosis not present

## 2020-03-15 DIAGNOSIS — Z1231 Encounter for screening mammogram for malignant neoplasm of breast: Secondary | ICD-10-CM | POA: Diagnosis not present

## 2020-03-15 DIAGNOSIS — Z01419 Encounter for gynecological examination (general) (routine) without abnormal findings: Secondary | ICD-10-CM | POA: Diagnosis not present

## 2020-03-15 DIAGNOSIS — Z6839 Body mass index (BMI) 39.0-39.9, adult: Secondary | ICD-10-CM | POA: Diagnosis not present

## 2020-03-30 DIAGNOSIS — G43001 Migraine without aura, not intractable, with status migrainosus: Secondary | ICD-10-CM | POA: Diagnosis not present

## 2020-03-30 DIAGNOSIS — E119 Type 2 diabetes mellitus without complications: Secondary | ICD-10-CM | POA: Diagnosis not present

## 2020-03-30 DIAGNOSIS — H269 Unspecified cataract: Secondary | ICD-10-CM | POA: Diagnosis not present

## 2020-03-30 DIAGNOSIS — F324 Major depressive disorder, single episode, in partial remission: Secondary | ICD-10-CM | POA: Diagnosis not present

## 2020-03-30 DIAGNOSIS — E7800 Pure hypercholesterolemia, unspecified: Secondary | ICD-10-CM | POA: Diagnosis not present

## 2020-03-30 DIAGNOSIS — M1712 Unilateral primary osteoarthritis, left knee: Secondary | ICD-10-CM | POA: Diagnosis not present

## 2020-03-30 DIAGNOSIS — M150 Primary generalized (osteo)arthritis: Secondary | ICD-10-CM | POA: Diagnosis not present

## 2020-03-30 DIAGNOSIS — I10 Essential (primary) hypertension: Secondary | ICD-10-CM | POA: Diagnosis not present

## 2020-04-08 ENCOUNTER — Other Ambulatory Visit: Payer: Self-pay

## 2020-04-08 ENCOUNTER — Encounter (HOSPITAL_COMMUNITY): Payer: Self-pay | Admitting: *Deleted

## 2020-04-08 ENCOUNTER — Emergency Department (HOSPITAL_COMMUNITY): Payer: Medicare HMO

## 2020-04-08 ENCOUNTER — Emergency Department (HOSPITAL_COMMUNITY)
Admission: EM | Admit: 2020-04-08 | Discharge: 2020-04-09 | Disposition: A | Discharge status: Home / Self Care | Payer: Medicare HMO | Attending: Emergency Medicine | Admitting: Emergency Medicine

## 2020-04-08 DIAGNOSIS — E876 Hypokalemia: Secondary | ICD-10-CM | POA: Diagnosis not present

## 2020-04-08 DIAGNOSIS — M545 Low back pain: Secondary | ICD-10-CM | POA: Diagnosis not present

## 2020-04-08 DIAGNOSIS — Z880 Allergy status to penicillin: Secondary | ICD-10-CM | POA: Diagnosis not present

## 2020-04-08 DIAGNOSIS — R10811 Right upper quadrant abdominal tenderness: Secondary | ICD-10-CM | POA: Insufficient documentation

## 2020-04-08 DIAGNOSIS — R112 Nausea with vomiting, unspecified: Secondary | ICD-10-CM | POA: Insufficient documentation

## 2020-04-08 DIAGNOSIS — R300 Dysuria: Secondary | ICD-10-CM | POA: Diagnosis not present

## 2020-04-08 DIAGNOSIS — R197 Diarrhea, unspecified: Secondary | ICD-10-CM | POA: Insufficient documentation

## 2020-04-08 DIAGNOSIS — I10 Essential (primary) hypertension: Secondary | ICD-10-CM | POA: Insufficient documentation

## 2020-04-08 DIAGNOSIS — E119 Type 2 diabetes mellitus without complications: Secondary | ICD-10-CM | POA: Insufficient documentation

## 2020-04-08 DIAGNOSIS — R10814 Left lower quadrant abdominal tenderness: Secondary | ICD-10-CM | POA: Insufficient documentation

## 2020-04-08 DIAGNOSIS — R109 Unspecified abdominal pain: Secondary | ICD-10-CM

## 2020-04-08 DIAGNOSIS — K8689 Other specified diseases of pancreas: Secondary | ICD-10-CM

## 2020-04-08 DIAGNOSIS — R1031 Right lower quadrant pain: Secondary | ICD-10-CM | POA: Diagnosis not present

## 2020-04-08 HISTORY — DX: Type 2 diabetes mellitus without complications: E11.9

## 2020-04-08 LAB — COMPREHENSIVE METABOLIC PANEL
ALT: 15 U/L (ref 0–44)
AST: 16 U/L (ref 15–41)
Albumin: 4.1 g/dL (ref 3.5–5.0)
Alkaline Phosphatase: 65 U/L (ref 38–126)
Anion gap: 12 (ref 5–15)
BUN: 16 mg/dL (ref 8–23)
CO2: 23 mmol/L (ref 22–32)
Calcium: 9.3 mg/dL (ref 8.9–10.3)
Chloride: 101 mmol/L (ref 98–111)
Creatinine, Ser: 0.77 mg/dL (ref 0.44–1.00)
GFR calc Af Amer: >60 mL/min (ref >60)
GFR calc non Af Amer: >60 mL/min (ref >60)
Glucose, Bld: 112 mg/dL — ABNORMAL HIGH (ref 70–99)
Potassium: 3.3 mmol/L — ABNORMAL LOW (ref 3.5–5.1)
Sodium: 136 mmol/L (ref 135–145)
Total Bilirubin: 0.8 mg/dL (ref 0.3–1.2)
Total Protein: 8.3 g/dL — ABNORMAL HIGH (ref 6.5–8.1)

## 2020-04-08 LAB — URINALYSIS, ROUTINE W REFLEX MICROSCOPIC
Bilirubin Urine: NEGATIVE
Glucose, UA: NEGATIVE mg/dL
Ketones, ur: NEGATIVE mg/dL
Leukocytes,Ua: NEGATIVE
Nitrite: NEGATIVE
Protein, ur: NEGATIVE mg/dL
RBC / HPF: >50 RBC/hpf — ABNORMAL HIGH (ref 0–5)
Specific Gravity, Urine: 1.01 (ref 1.005–1.030)
pH: 7 (ref 5.0–8.0)

## 2020-04-08 LAB — CBG MONITORING, ED: Glucose-Capillary: 105 mg/dL — ABNORMAL HIGH (ref 70–99)

## 2020-04-08 LAB — CBC
HCT: 46.4 % — ABNORMAL HIGH (ref 36.0–46.0)
Hemoglobin: 14.8 g/dL (ref 12.0–15.0)
MCH: 26 pg (ref 26.0–34.0)
MCHC: 31.9 g/dL (ref 30.0–36.0)
MCV: 81.4 fL (ref 80.0–100.0)
Platelets: 409 10*3/uL — ABNORMAL HIGH (ref 150–400)
RBC: 5.7 MIL/uL — ABNORMAL HIGH (ref 3.87–5.11)
RDW: 14.6 % (ref 11.5–15.5)
WBC: 5.3 10*3/uL (ref 4.0–10.5)
nRBC: 0 % (ref 0.0–0.2)

## 2020-04-08 LAB — LIPASE, BLOOD: Lipase: 28 U/L (ref 11–51)

## 2020-04-08 MED ORDER — SODIUM CHLORIDE 0.9 % IV BOLUS (SEPSIS)
1000.0000 mL | Freq: Once | INTRAVENOUS | Status: AC
  Administered 2020-04-08: 1000 mL via INTRAVENOUS

## 2020-04-08 MED ORDER — POTASSIUM CHLORIDE CRYS ER 20 MEQ PO TBCR
40.0000 meq | EXTENDED_RELEASE_TABLET | Freq: Once | ORAL | Status: AC
  Administered 2020-04-08: 40 meq via ORAL
  Filled 2020-04-08: qty 2

## 2020-04-08 MED ORDER — IOHEXOL 300 MG/ML  SOLN
100.0000 mL | Freq: Once | INTRAMUSCULAR | Status: AC | PRN
  Administered 2020-04-09: 100 mL via INTRAVENOUS

## 2020-04-08 MED ORDER — MORPHINE SULFATE (PF) 4 MG/ML IV SOLN
4.0000 mg | Freq: Once | INTRAVENOUS | Status: AC
  Administered 2020-04-08: 4 mg via INTRAVENOUS
  Filled 2020-04-08: qty 1

## 2020-04-08 MED ORDER — ONDANSETRON HCL 4 MG/2ML IJ SOLN
4.0000 mg | Freq: Once | INTRAMUSCULAR | Status: AC
  Administered 2020-04-08: 4 mg via INTRAVENOUS
  Filled 2020-04-08: qty 2

## 2020-04-08 MED ORDER — SODIUM CHLORIDE (PF) 0.9 % IJ SOLN
INTRAMUSCULAR | Status: AC
  Filled 2020-04-08: qty 50

## 2020-04-08 MED ORDER — SODIUM CHLORIDE 0.9% FLUSH: 3.0000 mL | Freq: Once | INTRAVENOUS | Status: DC

## 2020-04-08 NOTE — ED Notes (Signed)
Call pt no answere

## 2020-04-08 NOTE — ED Provider Notes (Signed)
[facility] DEPT Provider Note   CSN: [id number] Arrival date & time: 04/08/20  1552     History Chief Complaint  Patient presents with  . Abdominal Pain  . Back Pain    Erin Wolf is a 64 y.o. female.  Patient is a 64 year old female who has a past medical history of hypertension, hyperlipidemia, colitis, fibromyalgia, diabetes, seizure disorder presenting to the emergency department for abdominal pain, nausea vomiting.  Patient reports that this past Tuesday her symptoms began to start she feels like this is a flareup of her colitis.  She has had multiple episodes of mucousy diarrhea without blood.  Also has had nausea and vomiting and feels like she is unable to keep anything down.  She reports that pain radiates from her lower back into her right lower quadrant and usually this is where she has her colitis.  She also reports some pain with urination.  She also reports that she has a history of hematuria and this is not new.  Denies any vaginal bleeding or vaginal discharge.  Denies any fever, chills        Past Medical History:  Diagnosis Date  . Anxiety   . Arthritis   . Chronic abdominal pain   . Chronic lower back pain   . Chronic nausea   . Depression   . Diabetes mellitus without complication ([place])   . Diverticulosis   . Fibromyalgia   . Frequency of urination   . GERD (gastroesophageal reflux disease)   . Headache   . Hematuria   . Hepatitis C antibody positive in blood    per pt told by pcp  . History of panic attacks   . History of syncope    hx recurrent syncope --- per epic domentation non-cardiac , orthostatic hypotension, anxiety, dehydration  . History of uterine fibroid   . HTN (hypertension), benign   . Hyperlipidemia 01/03/2012  . IBS (irritable bowel syndrome)   . Myalgia   . Seizure disorder ([place]) followed by pcp until new neurologist appr. in jan 2019   "started having them in my 20's" ,  petit mal ----  per  pt last seizure one 2015  . SLE (systemic lupus erythematosus) [facility])    rheumatologist-- dr Gerilyn Nestle (consult 11-12-2016) having work-up done  . Ulcerative colitis    followed by dr Penelope Coop at [facility]  . Wears glasses     Patient Active Problem List   Diagnosis Date Noted  . Chronic migraine 11/10/2018  . Nausea & vomiting 10/18/2016  . History of ulcerative colitis   . LLQ abdominal pain 10/17/2016  . Syncope and collapse 06/29/2016  . Left-sided weakness 06/29/2016  . Facial twitching 06/29/2016  . IBS (irritable bowel syndrome) 06/29/2016  . Faintness   . Dehydration 05/09/2014  . GERD (gastroesophageal reflux disease) 05/09/2014  . Gastroenteritis 05/09/2014  . Chest pain, atypical 05/09/2014  . Chronic ulcerative colitis ([place]) 05/09/2014  . Acute gastroenteritis 05/09/2014  . Ulcerative colitis ([place]) 05/27/2013  . Hypokalemia 05/27/2013  . Anemia 01/04/2012  . Transaminitis 01/04/2012  . Diarrhea 01/04/2012  . Chest pain 01/03/2012  . SOB (shortness of breath) 01/03/2012  . HTN (hypertension), benign 01/03/2012  . Hx of migraines 01/03/2012  . Seizure ([place]) 01/03/2012  . Depression 01/03/2012  . Ulcerative colitis 01/03/2012  . Chronic back pain 01/03/2012  . 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 Hyperlipidemia 01/03/2012  . Hepatitis C 01/03/2012  . Fibroids 01/03/2012    Past Surgical History:  Procedure Laterality Date  . ABDOMINAL HYSTERECTOMY  1988  . CHOLECYSTECTOMY OPEN  1978  . COLONOSCOPY N/A 05/31/2013   Procedure: COLONOSCOPY;  Surgeon: Wonda Horner, MD;  Location: [facility] ENDOSCOPY;  Service: Endoscopy;  Laterality: N/A;  . CYSTOSCOPY/RETROGRADE/URETEROSCOPY Bilateral 07/23/2017   Procedure: CYSTOSCOPY/RETROGRADE/URETEROSCOPY;  Surgeon: Ceasar Mons, MD;  Location: [facility];  Service: Urology;  Laterality: Bilateral;  . ESOPHAGOGASTRODUODENOSCOPY (EGD) WITH PROPOFOL N/A 05/11/2014   Procedure: ESOPHAGOGASTRODUODENOSCOPY (EGD) WITH  PROPOFOL;  Surgeon: Lear Ng, MD;  Location: [facility] ENDOSCOPY;  Service: Endoscopy;  Laterality: N/A;  egd first  . EUS N/A 06/21/2016   Procedure: ESOPHAGEAL ENDOSCOPIC ULTRASOUND (EUS) RADIAL;  Surgeon: Arta Silence, MD;  Location: [facility] ENDOSCOPY;  Service: Endoscopy;  Laterality: N/A;  . FLEXIBLE SIGMOIDOSCOPY N/A 05/11/2014   Procedure: FLEXIBLE SIGMOIDOSCOPY;  Surgeon: Lear Ng, MD;  Location: [facility] ENDOSCOPY;  Service: Endoscopy;  Laterality: N/A;  . LAPAROTOMY W/ BILATERAL SALPINGOOPHORECTOMY  09-26-2003   dr Matthew Saras at [facility]  . LAPROSCOPY W/ LYSIS ADHESIONS  06-08-2003   dr Radene Knee [facility]  . TRANSTHORACIC ECHOCARDIOGRAM  04/29/2016   ef 60-65%,  grade 2 diastolic dysfunction/  trivial MR and TR  . TUBAL LIGATION Bilateral yrs ago     OB History   No obstetric history on file.     Family History  Problem Relation Age of Onset  . Alzheimer's disease Mother   . Hypertension Mother   . Colon cancer Brother   . Hypertension Brother   . Other Father        ruptured appendix  . Breast cancer Sister   . Hypertension Sister   . Hypertension Sister   . Memory loss Brother   . Hypertension Sister   . Heart disease Sister     Social History   Tobacco Use  . Smoking status: Never Smoker  . Smokeless tobacco: Never Used  Vaping Use  . Vaping Use: Never used  Substance Use Topics  . Alcohol use: Yes    Comment: very rare - special occasions  . Drug use: No    Home Medications Prior to Admission medications   Medication Sig Start Date End Date Taking? Authorizing Provider  cyclobenzaprine (FLEXERIL) 10 MG tablet Take 10 mg by mouth as needed for muscle spasms. 03/30/20  Yes [provider]  diazepam (VALIUM) 5 MG tablet Take 5 mg by mouth 2 (two) times daily as needed. 05/14/19  Yes [provider]  esomeprazole (NEXIUM) 40 MG capsule Take 40 mg by mouth daily before breakfast. 06/10/19  Yes [provider]  FLUoxetine (PROZAC) 20 MG capsule  Take 20 mg by mouth daily.    Yes [provider]  Multiple Vitamins-Minerals (WOMENS MULTI PO) Take 1 tablet by mouth daily.   Yes [provider]  Oxycodone HCl 10 MG TABS Take 10 mg by mouth 3 (three) times daily as needed for pain. 05/21/17  Yes [provider]  Polyethyl Glycol-Propyl Glycol (SYSTANE OP) Place 1 drop into both eyes 3 (three) times daily as needed (dry eyes.).   Yes [provider]  triamterene-hydrochlorothiazide (MAXZIDE) 75-50 MG tablet Take 1 tablet by mouth every morning. 03/30/20  Yes [provider]    Allergies    Latex, Nsaids, Penicillins, Wellbutrin [bupropion hcl], Aspirin, Darvocet [propoxyphene n-acetaminophen], Isometheptene-dichloral-apap, Tape, Toradol [ketorolac tromethamine], Tylenol [acetaminophen], Milk-related compounds, Other, and Dilaudid [hydromorphone hcl]  Review of Systems   Review of Systems  Constitutional: Positive for appetite change. Negative for chills and  fever.  Respiratory: Negative for cough and shortness of breath.   Cardiovascular: Negative for chest pain.  Gastrointestinal: Positive for abdominal pain, diarrhea, nausea and vomiting.  Genitourinary: Positive for dysuria and hematuria.  Musculoskeletal: Positive for back pain.  Skin: Negative for rash.  Neurological: Negative for dizziness.  All other systems reviewed and are negative.   Physical Exam Updated Vital Signs BP (!) 156/101   Pulse 84   Temp 98.4 F (36.9 C) (Oral)   Resp 18   Ht 5\' 5"  (1.651 m)   Wt 95.3 kg   SpO2 100%   BMI 34.95 kg/m   Physical Exam Vitals and nursing note reviewed.  Constitutional:      General: She is not in acute distress.    Appearance: Normal appearance. She is well-developed. She is not ill-appearing, toxic-appearing or diaphoretic.  HENT:     Head: Normocephalic.  Eyes:     Conjunctiva/sclera: Conjunctivae normal.  Cardiovascular:     Rate and Rhythm: Normal rate and regular rhythm.      Heart sounds: Normal heart sounds.  Pulmonary:     Effort: Pulmonary effort is normal.  Abdominal:     General: Bowel sounds are normal.     Palpations: Abdomen is soft.     Tenderness: There is abdominal tenderness in the right upper quadrant and right lower quadrant. There is guarding. There is no right CVA tenderness or left CVA tenderness. Negative signs include Murphy's sign and McBurney's sign.  Skin:    General: Skin is warm and dry.  Neurological:     Mental Status: She is alert.  Psychiatric:        Mood and Affect: Mood normal.     ED Results / Procedures / Treatments   Labs (all labs ordered are listed, but only abnormal results are displayed) Labs Reviewed  COMPREHENSIVE METABOLIC PANEL - Abnormal; Notable for the following components:      Result Value   Potassium 3.3 (*)    Glucose, Bld 112 (*)    Total Protein 8.3 (*)    All other components within normal limits  CBC - Abnormal; Notable for the following components:   RBC 5.70 (*)    HCT 46.4 (*)    Platelets 409 (*)    All other components within normal limits  URINALYSIS, ROUTINE W REFLEX MICROSCOPIC - Abnormal; Notable for the following components:   APPearance HAZY (*)    Hgb urine dipstick LARGE (*)    RBC / HPF >50 (*)    Bacteria, UA RARE (*)    All other components within normal limits  CBG MONITORING, ED - Abnormal; Notable for the following components:   Glucose-Capillary 105 (*)    All other components within normal limits  URINE CULTURE  LIPASE, BLOOD    EKG None  Radiology No results found.  Procedures Procedures (including critical care time)  Medications Ordered in ED Medications  sodium chloride flush (NS) 0.9 % injection 3 mL (has no administration in time range)  sodium chloride 0.9 % bolus 1,000 mL (0 mLs Intravenous Stopped 04/08/20 2310)  ondansetron (ZOFRAN) injection 4 mg (4 mg Intravenous Given 04/08/20 2210)  morphine 4 MG/ML injection 4 mg (4 mg Intravenous Given  04/08/20 2210)  potassium chloride SA (KLOR-CON) CR tablet 40 mEq (40 mEq Oral Given 04/08/20 2323)  morphine 4 MG/ML injection 4 mg (4 mg Intravenous Given 04/08/20 2323)    ED Course  I have reviewed the triage vital signs and the  nursing notes.  Pertinent labs & imaging results that were available during my care of the patient were reviewed by me and considered in my medical decision making (see chart for details).  Clinical Course as of Apr 08 2350  Sat Apr 08, 2020  2313 Patient with history of colitis presenting for symptoms the same. Appears well but uncomfortable on exam with RLQ pain and mildly tachycardic. Mild hypokalemia to 3.3 but otherwise reassuring labs. She did have hgb and RBC in urine but she states this is not new and has been followed by PMD. Plans to obtain CT scan, fluids, pain control.   [KM]  2350 Care signed out to [facility] PA due to change of shift to f/u on CT scan and dispo accordingly.    [KM]    Clinical Course User Index [KM] Kristine Royal   MDM Rules/Calculators/A&P                           Final Clinical Impression(s) / ED Diagnoses Final diagnoses:  None    Rx / DC Orders ED Discharge Orders    None       Kristine Royal 04/08/20 2351    Sherwood Gambler, MD 04/08/20 2354

## 2020-04-08 NOTE — ED Triage Notes (Signed)
Pt states she has been having lower back pain and abd pain since Tuesday, recent loss of sister, under a lot of stress

## 2020-04-08 NOTE — ED Notes (Signed)
Patient has gold top in the main lab

## 2020-04-09 DIAGNOSIS — R109 Unspecified abdominal pain: Secondary | ICD-10-CM | POA: Diagnosis not present

## 2020-04-09 NOTE — Discharge Instructions (Addendum)
Continue taking home medications as prescribed. Use your home pain and nausea medications as needed. Make sure you stay well-hydrated with water. It is important that you follow-up with your GI doctor for further evaluation of your dilated pancreatic duct. Return to the emergency room if you develop fevers, persistent vomiting, severe worsening pain, or any new, worsening, or concerning symptoms.

## 2020-04-09 NOTE — ED Provider Notes (Addendum)
Physical Exam  BP (!) 156/101   Pulse 84   Temp 98.4 F (36.9 C) (Oral)   Resp 18   Ht 5\' 5"  (1.651 m)   Wt 95.3 kg   SpO2 100%   BMI 34.95 kg/m   Physical Exam  Gen: Pt appears nontoxic Abd: nontender CV: RRR  ED Course/Procedures   Clinical Course as of Apr 10 7  Sat Apr 08, 2020  2313 Patient with history of colitis presenting for symptoms the same. Appears well but uncomfortable on exam with RLQ pain and mildly tachycardic. Mild hypokalemia to 3.3 but otherwise reassuring labs. She did have hgb and RBC in urine but she states this is not new and has been followed by PMD. Plans to obtain CT scan, fluids, pain control.   [KM]  2350 Care signed out to [facility] PA due to change of shift to f/u on CT scan and dispo accordingly.    [KM]    Clinical Course User Index [KM] Alveria Apley, PA-C    Procedures  MDM   Pt signed out to me by Coral Else, PA-C. Please see previous history for further history.   In brief, patient presenting for evaluation of right lower quadrant abdominal pain, nausea, vomiting, diarrhea for several days.  Patient states this is consistent with her previous episodes of IBS/colitis.  She is followed with GI.  Upon arrival to the ER, patient was mildly tachycardic and fairly uncomfortable.  This has improved with fluids and symptomatic treatment.  Labs overall reassuring, mild hypokalemia of 3.3.  She is pending a CT scan.  She is also reporting urinary symptoms, however UA is overall reassuring.  She does have blood and few bacteria, reports chronic hematuria.  Low suspicion for kidney stone.  If CT scan is negative, plan for discharge with GI follow-up.  CT negative for acute findings such as infection, perforation, obstruction.  Does show progressive dilation of the biliary and pancreatic duct status post cholecystectomy.  Recommends MRCP for follow-up to rule out pancreatic mass.  Patient is afebrile, without leukocytosis or change in LFTs.   As such, low suspicion for infected stone.  As symptoms have improved and heart rate has normalized, I do not leave she needs emergent MRCP.  This can be followed up outpatient with her GI doctor, Dr. Penelope Coop.  Discussed findings with patient and importance of follow-up.  Discussed continued symptomatic treatment at home.  Patient reports she has pain medicine and nausea medicine at home.  She has tolerated p.o. without difficulty in the ED.  At this time, patient appears safe for discharge.  Return precautions given.  Patient states she understands and agrees to plan.     CT ABDOMEN PELVIS W CONTRAST  Result Date: 04/09/2020 CLINICAL DATA:  Right lower quadrant abdominal pain. Low back and abdominal pain for 5 days. EXAM: CT ABDOMEN AND PELVIS WITH CONTRAST TECHNIQUE: Multidetector CT imaging of the abdomen and pelvis was performed using the standard protocol following bolus administration of intravenous contrast. CONTRAST:  167mL OMNIPAQUE IOHEXOL 300 MG/ML  SOLN COMPARISON:  CT 08/01/2017, additional priors. FINDINGS: Lower chest: Mild central bronchiectasis in the lower lobes. No acute airspace disease or pleural fluid. Hepatobiliary: Post cholecystectomy. There is progressive biliary dilatation of common bile duct measuring 18 mm at the porta hepatis and progressive intrahepatic biliary ductal dilatation. Normal tapering of the distal common bile duct to the ampulla. No focal hepatic lesion. Pancreas: Pancreatic ductal dilatation with proximal pancreatic duct measuring 7 mm,  6 mm in the body. No peripancreatic fat stranding. No evidence of pancreatic mass. Spleen: Normal in size without focal abnormality. Adrenals/Urinary Tract: Stable 13 mm fat density left adrenal nodule consistent with myelolipoma. Normal right adrenal gland. No hydronephrosis or perinephric edema. Homogeneous renal enhancement with symmetric excretion on delayed phase imaging. Urinary bladder is physiologically distended without wall  thickening. Stomach/Bowel: Small amount of high-density material in the distal esophagus and stomach. No gastric wall thickening. There is no small bowel obstruction or inflammation. High-riding cecum in the right mid abdomen. The appendix is not visualized, no inflammatory change in the right lower quadrant or pericecal to suggest appendicitis. Scattered colonic diverticulosis including minimally in the right colon, without evidence of diverticulitis. There is no colonic wall thickening. No pericolonic edema. Vascular/Lymphatic: Normal caliber abdominal aorta. The portal vein is patent. No adenopathy in the abdomen or pelvis. Reproductive: Status post hysterectomy. No adnexal masses. Other: No free air, free fluid, or intra-abdominal fluid collection. No inguinal or abdominal wall hernia. Musculoskeletal: Degenerative change throughout the lumbar spine. There are no acute or suspicious osseous abnormalities. IMPRESSION: 1. Post cholecystectomy with progressive biliary and pancreatic ductal dilatation from prior exam. No obstructing stone or mass visualized by CT. This appears waxing and waning on prior exams. LFTs are normal which argues against biliary obstruction. Consider further evaluation with MRCP to exclude underlying pancreatic mass. 2. Colonic diverticulosis without diverticulitis. 3. Stable left adrenal myelolipoma. Electronically Signed   By: Keith Rake M.D.   On: 04/09/2020 00:32      Franchot Heidelberg, PA-C 04/09/20 Missy Sabins, MD 04/09/20 [phone]

## 2020-04-10 LAB — URINE CULTURE: Culture: >=100000 — AB

## 2020-05-01 DIAGNOSIS — E119 Type 2 diabetes mellitus without complications: Secondary | ICD-10-CM | POA: Diagnosis not present

## 2020-05-01 DIAGNOSIS — E7800 Pure hypercholesterolemia, unspecified: Secondary | ICD-10-CM | POA: Diagnosis not present

## 2020-05-01 DIAGNOSIS — I10 Essential (primary) hypertension: Secondary | ICD-10-CM | POA: Diagnosis not present

## 2020-05-01 DIAGNOSIS — M150 Primary generalized (osteo)arthritis: Secondary | ICD-10-CM | POA: Diagnosis not present

## 2020-05-01 DIAGNOSIS — G43001 Migraine without aura, not intractable, with status migrainosus: Secondary | ICD-10-CM | POA: Diagnosis not present

## 2020-05-01 DIAGNOSIS — F324 Major depressive disorder, single episode, in partial remission: Secondary | ICD-10-CM | POA: Diagnosis not present

## 2020-05-01 DIAGNOSIS — M1712 Unilateral primary osteoarthritis, left knee: Secondary | ICD-10-CM | POA: Diagnosis not present

## 2020-05-01 DIAGNOSIS — H269 Unspecified cataract: Secondary | ICD-10-CM | POA: Diagnosis not present

## 2020-05-04 ENCOUNTER — Inpatient Hospital Stay (HOSPITAL_BASED_OUTPATIENT_CLINIC_OR_DEPARTMENT_OTHER)
Admission: EM | Admit: 2020-05-04 | Discharge: 2020-05-09 | DRG: 446 | Disposition: A | Discharge status: Home / Self Care | Payer: Medicare HMO | Attending: Internal Medicine | Admitting: Internal Medicine

## 2020-05-04 ENCOUNTER — Other Ambulatory Visit: Payer: Self-pay

## 2020-05-04 ENCOUNTER — Encounter (HOSPITAL_BASED_OUTPATIENT_CLINIC_OR_DEPARTMENT_OTHER): Payer: Self-pay

## 2020-05-04 DIAGNOSIS — K8689 Other specified diseases of pancreas: Secondary | ICD-10-CM | POA: Diagnosis not present

## 2020-05-04 DIAGNOSIS — K589 Irritable bowel syndrome without diarrhea: Secondary | ICD-10-CM | POA: Diagnosis present

## 2020-05-04 DIAGNOSIS — M329 Systemic lupus erythematosus, unspecified: Secondary | ICD-10-CM | POA: Diagnosis present

## 2020-05-04 DIAGNOSIS — Z820 Family history of epilepsy and other diseases of the nervous system: Secondary | ICD-10-CM | POA: Diagnosis not present

## 2020-05-04 DIAGNOSIS — Z0389 Encounter for observation for other suspected diseases and conditions ruled out: Secondary | ICD-10-CM

## 2020-05-04 DIAGNOSIS — M1990 Unspecified osteoarthritis, unspecified site: Secondary | ICD-10-CM | POA: Diagnosis not present

## 2020-05-04 DIAGNOSIS — F329 Major depressive disorder, single episode, unspecified: Secondary | ICD-10-CM | POA: Diagnosis present

## 2020-05-04 DIAGNOSIS — Z20822 Contact with and (suspected) exposure to covid-19: Secondary | ICD-10-CM | POA: Diagnosis not present

## 2020-05-04 DIAGNOSIS — G894 Chronic pain syndrome: Secondary | ICD-10-CM | POA: Diagnosis present

## 2020-05-04 DIAGNOSIS — Z91011 Allergy to milk products: Secondary | ICD-10-CM

## 2020-05-04 DIAGNOSIS — K5730 Diverticulosis of large intestine without perforation or abscess without bleeding: Secondary | ICD-10-CM | POA: Diagnosis not present

## 2020-05-04 DIAGNOSIS — M545 Low back pain: Secondary | ICD-10-CM | POA: Diagnosis not present

## 2020-05-04 DIAGNOSIS — E669 Obesity, unspecified: Secondary | ICD-10-CM | POA: Diagnosis present

## 2020-05-04 DIAGNOSIS — K5190 Ulcerative colitis, unspecified, without complications: Secondary | ICD-10-CM | POA: Diagnosis present

## 2020-05-04 DIAGNOSIS — R1900 Intra-abdominal and pelvic swelling, mass and lump, unspecified site: Secondary | ICD-10-CM

## 2020-05-04 DIAGNOSIS — G8929 Other chronic pain: Secondary | ICD-10-CM | POA: Diagnosis not present

## 2020-05-04 DIAGNOSIS — E876 Hypokalemia: Secondary | ICD-10-CM | POA: Diagnosis present

## 2020-05-04 DIAGNOSIS — E1165 Type 2 diabetes mellitus with hyperglycemia: Secondary | ICD-10-CM | POA: Diagnosis present

## 2020-05-04 DIAGNOSIS — D1779 Benign lipomatous neoplasm of other sites: Secondary | ICD-10-CM | POA: Diagnosis not present

## 2020-05-04 DIAGNOSIS — I10 Essential (primary) hypertension: Secondary | ICD-10-CM | POA: Diagnosis present

## 2020-05-04 DIAGNOSIS — Z6836 Body mass index (BMI) 36.0-36.9, adult: Secondary | ICD-10-CM

## 2020-05-04 DIAGNOSIS — K838 Other specified diseases of biliary tract: Principal | ICD-10-CM | POA: Diagnosis present

## 2020-05-04 DIAGNOSIS — B1920 Unspecified viral hepatitis C without hepatic coma: Secondary | ICD-10-CM | POA: Diagnosis not present

## 2020-05-04 DIAGNOSIS — M25562 Pain in left knee: Secondary | ICD-10-CM | POA: Diagnosis not present

## 2020-05-04 DIAGNOSIS — Z8249 Family history of ischemic heart disease and other diseases of the circulatory system: Secondary | ICD-10-CM | POA: Diagnosis not present

## 2020-05-04 DIAGNOSIS — Z803 Family history of malignant neoplasm of breast: Secondary | ICD-10-CM

## 2020-05-04 DIAGNOSIS — Z90710 Acquired absence of both cervix and uterus: Secondary | ICD-10-CM

## 2020-05-04 DIAGNOSIS — M797 Fibromyalgia: Secondary | ICD-10-CM | POA: Diagnosis present

## 2020-05-04 DIAGNOSIS — Z91040 Latex allergy status: Secondary | ICD-10-CM

## 2020-05-04 DIAGNOSIS — M25561 Pain in right knee: Secondary | ICD-10-CM | POA: Diagnosis not present

## 2020-05-04 DIAGNOSIS — D219 Benign neoplasm of connective and other soft tissue, unspecified: Secondary | ICD-10-CM | POA: Diagnosis not present

## 2020-05-04 DIAGNOSIS — Z91048 Other nonmedicinal substance allergy status: Secondary | ICD-10-CM

## 2020-05-04 DIAGNOSIS — Z9049 Acquired absence of other specified parts of digestive tract: Secondary | ICD-10-CM

## 2020-05-04 DIAGNOSIS — Z880 Allergy status to penicillin: Secondary | ICD-10-CM

## 2020-05-04 DIAGNOSIS — Z888 Allergy status to other drugs, medicaments and biological substances status: Secondary | ICD-10-CM

## 2020-05-04 DIAGNOSIS — K219 Gastro-esophageal reflux disease without esophagitis: Secondary | ICD-10-CM | POA: Diagnosis present

## 2020-05-04 DIAGNOSIS — Z885 Allergy status to narcotic agent status: Secondary | ICD-10-CM

## 2020-05-04 DIAGNOSIS — E860 Dehydration: Secondary | ICD-10-CM | POA: Diagnosis present

## 2020-05-04 DIAGNOSIS — R634 Abnormal weight loss: Secondary | ICD-10-CM | POA: Diagnosis not present

## 2020-05-04 DIAGNOSIS — K625 Hemorrhage of anus and rectum: Secondary | ICD-10-CM | POA: Diagnosis not present

## 2020-05-04 DIAGNOSIS — R932 Abnormal findings on diagnostic imaging of liver and biliary tract: Secondary | ICD-10-CM | POA: Diagnosis not present

## 2020-05-04 DIAGNOSIS — E785 Hyperlipidemia, unspecified: Secondary | ICD-10-CM | POA: Diagnosis not present

## 2020-05-04 DIAGNOSIS — R310 Gross hematuria: Secondary | ICD-10-CM | POA: Diagnosis not present

## 2020-05-04 DIAGNOSIS — K760 Fatty (change of) liver, not elsewhere classified: Secondary | ICD-10-CM | POA: Diagnosis not present

## 2020-05-04 DIAGNOSIS — R109 Unspecified abdominal pain: Secondary | ICD-10-CM | POA: Diagnosis present

## 2020-05-04 DIAGNOSIS — R1011 Right upper quadrant pain: Secondary | ICD-10-CM | POA: Diagnosis not present

## 2020-05-04 DIAGNOSIS — F410 Panic disorder [episodic paroxysmal anxiety] without agoraphobia: Secondary | ICD-10-CM | POA: Diagnosis present

## 2020-05-04 DIAGNOSIS — Z800 Family history of malignant neoplasm of digestive organs: Secondary | ICD-10-CM

## 2020-05-04 DIAGNOSIS — E278 Other specified disorders of adrenal gland: Secondary | ICD-10-CM | POA: Diagnosis not present

## 2020-05-04 DIAGNOSIS — Z79899 Other long term (current) drug therapy: Secondary | ICD-10-CM

## 2020-05-04 LAB — CBC
HCT: 45.5 % (ref 36.0–46.0)
Hemoglobin: 14.4 g/dL (ref 12.0–15.0)
MCH: 25.5 pg — ABNORMAL LOW (ref 26.0–34.0)
MCHC: 31.6 g/dL (ref 30.0–36.0)
MCV: 80.7 fL (ref 80.0–100.0)
Platelets: 345 10*3/uL (ref 150–400)
RBC: 5.64 MIL/uL — ABNORMAL HIGH (ref 3.87–5.11)
RDW: 14.7 % (ref 11.5–15.5)
WBC: 5.7 10*3/uL (ref 4.0–10.5)
nRBC: 0 % (ref 0.0–0.2)

## 2020-05-04 MED ORDER — SODIUM CHLORIDE 0.9% FLUSH
3.0000 mL | Freq: Once | INTRAVENOUS | Status: DC
  Filled 2020-05-04: qty 3

## 2020-05-04 NOTE — ED Triage Notes (Addendum)
Pt c/o right side abd and right flank pain x "months"-states she has been seen multiple times for same c/o-NAD-to triage in w/c

## 2020-05-05 ENCOUNTER — Observation Stay (HOSPITAL_COMMUNITY): Payer: Medicare HMO

## 2020-05-05 ENCOUNTER — Emergency Department (HOSPITAL_BASED_OUTPATIENT_CLINIC_OR_DEPARTMENT_OTHER): Payer: Medicare HMO

## 2020-05-05 ENCOUNTER — Encounter (HOSPITAL_BASED_OUTPATIENT_CLINIC_OR_DEPARTMENT_OTHER): Payer: Self-pay

## 2020-05-05 DIAGNOSIS — Z6836 Body mass index (BMI) 36.0-36.9, adult: Secondary | ICD-10-CM | POA: Diagnosis not present

## 2020-05-05 DIAGNOSIS — M797 Fibromyalgia: Secondary | ICD-10-CM | POA: Diagnosis present

## 2020-05-05 DIAGNOSIS — M329 Systemic lupus erythematosus, unspecified: Secondary | ICD-10-CM | POA: Diagnosis present

## 2020-05-05 DIAGNOSIS — G8929 Other chronic pain: Secondary | ICD-10-CM | POA: Diagnosis not present

## 2020-05-05 DIAGNOSIS — G894 Chronic pain syndrome: Secondary | ICD-10-CM | POA: Diagnosis present

## 2020-05-05 DIAGNOSIS — K589 Irritable bowel syndrome without diarrhea: Secondary | ICD-10-CM | POA: Diagnosis present

## 2020-05-05 DIAGNOSIS — E785 Hyperlipidemia, unspecified: Secondary | ICD-10-CM | POA: Diagnosis present

## 2020-05-05 DIAGNOSIS — Z20822 Contact with and (suspected) exposure to covid-19: Secondary | ICD-10-CM | POA: Diagnosis present

## 2020-05-05 DIAGNOSIS — K219 Gastro-esophageal reflux disease without esophagitis: Secondary | ICD-10-CM | POA: Diagnosis present

## 2020-05-05 DIAGNOSIS — Z820 Family history of epilepsy and other diseases of the nervous system: Secondary | ICD-10-CM | POA: Diagnosis not present

## 2020-05-05 DIAGNOSIS — Z9049 Acquired absence of other specified parts of digestive tract: Secondary | ICD-10-CM | POA: Diagnosis not present

## 2020-05-05 DIAGNOSIS — M25562 Pain in left knee: Secondary | ICD-10-CM

## 2020-05-05 DIAGNOSIS — R109 Unspecified abdominal pain: Secondary | ICD-10-CM | POA: Diagnosis present

## 2020-05-05 DIAGNOSIS — M25561 Pain in right knee: Secondary | ICD-10-CM | POA: Diagnosis not present

## 2020-05-05 DIAGNOSIS — E1165 Type 2 diabetes mellitus with hyperglycemia: Secondary | ICD-10-CM | POA: Diagnosis present

## 2020-05-05 DIAGNOSIS — M1990 Unspecified osteoarthritis, unspecified site: Secondary | ICD-10-CM | POA: Diagnosis present

## 2020-05-05 DIAGNOSIS — I10 Essential (primary) hypertension: Secondary | ICD-10-CM | POA: Diagnosis present

## 2020-05-05 DIAGNOSIS — D219 Benign neoplasm of connective and other soft tissue, unspecified: Secondary | ICD-10-CM

## 2020-05-05 DIAGNOSIS — E876 Hypokalemia: Secondary | ICD-10-CM | POA: Diagnosis present

## 2020-05-05 DIAGNOSIS — F329 Major depressive disorder, single episode, unspecified: Secondary | ICD-10-CM | POA: Diagnosis present

## 2020-05-05 DIAGNOSIS — Z8249 Family history of ischemic heart disease and other diseases of the circulatory system: Secondary | ICD-10-CM | POA: Diagnosis not present

## 2020-05-05 DIAGNOSIS — E669 Obesity, unspecified: Secondary | ICD-10-CM | POA: Diagnosis present

## 2020-05-05 DIAGNOSIS — E860 Dehydration: Secondary | ICD-10-CM | POA: Diagnosis present

## 2020-05-05 DIAGNOSIS — K838 Other specified diseases of biliary tract: Secondary | ICD-10-CM | POA: Diagnosis present

## 2020-05-05 DIAGNOSIS — Z90710 Acquired absence of both cervix and uterus: Secondary | ICD-10-CM | POA: Diagnosis not present

## 2020-05-05 DIAGNOSIS — F410 Panic disorder [episodic paroxysmal anxiety] without agoraphobia: Secondary | ICD-10-CM | POA: Diagnosis present

## 2020-05-05 DIAGNOSIS — M545 Low back pain: Secondary | ICD-10-CM | POA: Diagnosis present

## 2020-05-05 LAB — URINALYSIS, ROUTINE W REFLEX MICROSCOPIC
Glucose, UA: NEGATIVE mg/dL
Ketones, ur: 40 mg/dL — AB
Leukocytes,Ua: NEGATIVE
Nitrite: NEGATIVE
Protein, ur: 30 mg/dL — AB
Specific Gravity, Urine: 1.02 (ref 1.005–1.030)
pH: 6.5 (ref 5.0–8.0)

## 2020-05-05 LAB — COMPREHENSIVE METABOLIC PANEL
ALT: 17 U/L (ref 0–44)
AST: 18 U/L (ref 15–41)
Albumin: 4.1 g/dL (ref 3.5–5.0)
Alkaline Phosphatase: 73 U/L (ref 38–126)
Anion gap: 14 (ref 5–15)
BUN: 13 mg/dL (ref 8–23)
CO2: 26 mmol/L (ref 22–32)
Calcium: 9.4 mg/dL (ref 8.9–10.3)
Chloride: 97 mmol/L — ABNORMAL LOW (ref 98–111)
Creatinine, Ser: 0.7 mg/dL (ref 0.44–1.00)
GFR calc Af Amer: >60 mL/min (ref >60)
GFR calc non Af Amer: >60 mL/min (ref >60)
Glucose, Bld: 113 mg/dL — ABNORMAL HIGH (ref 70–99)
Potassium: 3.3 mmol/L — ABNORMAL LOW (ref 3.5–5.1)
Sodium: 137 mmol/L (ref 135–145)
Total Bilirubin: 0.6 mg/dL (ref 0.3–1.2)
Total Protein: 8.8 g/dL — ABNORMAL HIGH (ref 6.5–8.1)

## 2020-05-05 LAB — URINALYSIS, MICROSCOPIC (REFLEX): RBC / HPF: >50 RBC/hpf (ref 0–5)

## 2020-05-05 LAB — CBC
HCT: 48.2 % — ABNORMAL HIGH (ref 36.0–46.0)
Hemoglobin: 15 g/dL (ref 12.0–15.0)
MCH: 26.1 pg (ref 26.0–34.0)
MCHC: 31.1 g/dL (ref 30.0–36.0)
MCV: 84 fL (ref 80.0–100.0)
Platelets: 355 10*3/uL (ref 150–400)
RBC: 5.74 MIL/uL — ABNORMAL HIGH (ref 3.87–5.11)
RDW: 15.1 % (ref 11.5–15.5)
WBC: 4.6 10*3/uL (ref 4.0–10.5)
nRBC: 0 % (ref 0.0–0.2)

## 2020-05-05 LAB — CREATININE, SERUM
Creatinine, Ser: 0.73 mg/dL (ref 0.44–1.00)
GFR calc Af Amer: >60 mL/min (ref >60)
GFR calc non Af Amer: >60 mL/min (ref >60)

## 2020-05-05 LAB — HEMOGLOBIN A1C
Hgb A1c MFr Bld: 6.6 % — ABNORMAL HIGH (ref 4.8–5.6)
Mean Plasma Glucose: 142.72 mg/dL

## 2020-05-05 LAB — HIV ANTIBODY (ROUTINE TESTING W REFLEX): HIV Screen 4th Generation wRfx: NONREACTIVE

## 2020-05-05 LAB — LIPASE, BLOOD: Lipase: 22 U/L (ref 11–51)

## 2020-05-05 LAB — SARS CORONAVIRUS 2 BY RT PCR (HOSPITAL ORDER, PERFORMED IN ~~LOC~~ HOSPITAL LAB): SARS Coronavirus 2: NEGATIVE

## 2020-05-05 LAB — GLUCOSE, CAPILLARY: Glucose-Capillary: 130 mg/dL — ABNORMAL HIGH (ref 70–99)

## 2020-05-05 MED ORDER — ENOXAPARIN SODIUM 40 MG/0.4ML ~~LOC~~ SOLN
40.0000 mg | SUBCUTANEOUS | Status: DC
  Administered 2020-05-05 – 2020-05-08 (×4): 40 mg via SUBCUTANEOUS
  Filled 2020-05-05 (×4): qty 0.4

## 2020-05-05 MED ORDER — IOHEXOL 300 MG/ML  SOLN
100.0000 mL | Freq: Once | INTRAMUSCULAR | Status: AC | PRN
  Administered 2020-05-05: 100 mL via INTRAVENOUS

## 2020-05-05 MED ORDER — ONDANSETRON HCL 4 MG/2ML IJ SOLN
4.0000 mg | Freq: Once | INTRAMUSCULAR | Status: AC
  Administered 2020-05-05: 4 mg via INTRAVENOUS
  Filled 2020-05-05: qty 2

## 2020-05-05 MED ORDER — OXYCODONE HCL 10 MG PO TABS: 10.0000 mg | ORAL_TABLET | Freq: Three times a day (TID) | ORAL | Status: DC | PRN

## 2020-05-05 MED ORDER — HYDROMORPHONE HCL 1 MG/ML IJ SOLN
1.0000 mg | Freq: Once | INTRAMUSCULAR | Status: AC
  Administered 2020-05-05: 1 mg via INTRAVENOUS
  Filled 2020-05-05: qty 1

## 2020-05-05 MED ORDER — PANTOPRAZOLE SODIUM 40 MG PO TBEC
40.0000 mg | DELAYED_RELEASE_TABLET | Freq: Every day | ORAL | Status: DC
  Administered 2020-05-06 – 2020-05-09 (×4): 40 mg via ORAL
  Filled 2020-05-05 (×4): qty 1

## 2020-05-05 MED ORDER — INSULIN ASPART 100 UNIT/ML ~~LOC~~ SOLN: 0.0000 [IU] | Freq: Three times a day (TID) | SUBCUTANEOUS | Status: DC

## 2020-05-05 MED ORDER — ALPRAZOLAM 1 MG PO TABS
1.0000 mg | ORAL_TABLET | Freq: Three times a day (TID) | ORAL | Status: DC | PRN
  Administered 2020-05-05 – 2020-05-06 (×2): 1 mg via ORAL
  Filled 2020-05-05 (×2): qty 1

## 2020-05-05 MED ORDER — ACETAMINOPHEN 325 MG PO TABS: 650.0000 mg | ORAL_TABLET | Freq: Four times a day (QID) | ORAL | Status: DC | PRN

## 2020-05-05 MED ORDER — ACETAMINOPHEN 650 MG RE SUPP: 650.0000 mg | Freq: Four times a day (QID) | RECTAL | Status: DC | PRN

## 2020-05-05 MED ORDER — POTASSIUM CHLORIDE CRYS ER 20 MEQ PO TBCR
40.0000 meq | EXTENDED_RELEASE_TABLET | Freq: Once | ORAL | Status: AC
  Administered 2020-05-05: 40 meq via ORAL
  Filled 2020-05-05: qty 2

## 2020-05-05 MED ORDER — TRAZODONE HCL 50 MG PO TABS
50.0000 mg | ORAL_TABLET | Freq: Once | ORAL | Status: AC
  Administered 2020-05-06: 50 mg via ORAL
  Filled 2020-05-05: qty 1

## 2020-05-05 MED ORDER — HYOSCYAMINE SULFATE 0.125 MG PO TBDP
0.2500 mg | ORAL_TABLET | Freq: Four times a day (QID) | ORAL | Status: DC | PRN
  Administered 2020-05-07: 0.25 mg via ORAL
  Filled 2020-05-05 (×3): qty 2

## 2020-05-05 MED ORDER — INSULIN ASPART 100 UNIT/ML ~~LOC~~ SOLN: 0.0000 [IU] | Freq: Every day | SUBCUTANEOUS | Status: DC

## 2020-05-05 MED ORDER — SODIUM CHLORIDE 0.9 % IV SOLN: INTRAVENOUS | Status: DC

## 2020-05-05 MED ORDER — GADOBUTROL 1 MMOL/ML IV SOLN
10.0000 mL | Freq: Once | INTRAVENOUS | Status: AC | PRN
  Administered 2020-05-05: 10 mL via INTRAVENOUS

## 2020-05-05 MED ORDER — OXYCODONE HCL 5 MG PO TABS
10.0000 mg | ORAL_TABLET | Freq: Once | ORAL | Status: AC
  Administered 2020-05-05: 10 mg via ORAL
  Filled 2020-05-05: qty 2

## 2020-05-05 MED ORDER — MORPHINE SULFATE (PF) 2 MG/ML IV SOLN
1.0000 mg | INTRAVENOUS | Status: DC | PRN
  Administered 2020-05-05 – 2020-05-06 (×2): 1 mg via INTRAVENOUS
  Filled 2020-05-05 (×3): qty 1

## 2020-05-05 NOTE — H&P (Signed)
History and Physical    Erin Wolf:[id number] DOB: 1956/01/31 DOA: 05/04/2020  PCP: Shirline Frees, MD  Patient coming from: Home  Chief Complaint: Abd pain  HPI: Erin Wolf is a 64 y.o. female with medical history significant of IBS, hx UC, s/p cholecystectomy, DM who presented to [facility] with complaints of worsening abd pain with nausea and vomiting that started one day prior to admit with pain worsening 2-3 days prior. Pt has reported decreased PO intake as a result. Pt denies fevers or chills. Denies chest pains.   ED Course: In the ED, pt found to have findings worrisome for interval progression of intrahepatic biliary ductal diliation since 2018 study. Concerns made specifically for ampullary mass. GI consulted, recommending MRCP. Pt was transferred to [facility] from [facility] for further GI work up. Hospitalist consulted and pt was accepted in transfer.  Review of Systems:  Review of Systems  Constitutional: Negative for chills, fever and malaise/fatigue.  HENT: Negative for ear discharge, ear pain, nosebleeds and tinnitus.   Eyes: Negative for double vision, photophobia and pain.  Respiratory: Negative for hemoptysis, sputum production and shortness of breath.   Cardiovascular: Negative for chest pain, palpitations and orthopnea.  Gastrointestinal: Positive for abdominal pain, diarrhea, nausea and vomiting.  Genitourinary: Negative for dysuria and urgency.  Musculoskeletal: Negative for back pain, joint pain and neck pain.  Neurological: Negative for tremors, sensory change, speech change and loss of consciousness.  Psychiatric/Behavioral: Negative for hallucinations and memory loss. The patient is not nervous/anxious.     Past Medical History:  Diagnosis Date  . Anxiety   . Arthritis   . Chronic abdominal pain   . Chronic lower back pain   . Chronic nausea   . Depression   . Diabetes mellitus without complication ([place])   . Diverticulosis   . Fibromyalgia   .  Frequency of urination   . GERD (gastroesophageal reflux disease)   . Headache   . Hematuria   . Hepatitis C antibody positive in blood    per pt told by pcp  . History of panic attacks   . History of syncope    hx recurrent syncope --- per epic domentation non-cardiac , orthostatic hypotension, anxiety, dehydration  . History of uterine fibroid   . HTN (hypertension), benign   . Hyperlipidemia 01/03/2012  . IBS (irritable bowel syndrome)   . Myalgia   . Seizure disorder ([place]) followed by pcp until new neurologist appr. in jan 2019   "started having them in my 20's" ,  petit mal ----  per pt last seizure one 2015  . SLE (systemic lupus erythematosus) [facility])    rheumatologist-- dr Gerilyn Nestle (consult 11-12-2016) having work-up done  . Ulcerative colitis    followed by dr Penelope Coop at [place]  . Wears glasses     Past Surgical History:  Procedure Laterality Date  . ABDOMINAL HYSTERECTOMY  1988  . CHOLECYSTECTOMY OPEN  1978  . COLONOSCOPY N/A 05/31/2013   Procedure: COLONOSCOPY;  Surgeon: Wonda Horner, MD;  Location: [facility] ENDOSCOPY;  Service: Endoscopy;  Laterality: N/A;  . CYSTOSCOPY/RETROGRADE/URETEROSCOPY Bilateral 07/23/2017   Procedure: CYSTOSCOPY/RETROGRADE/URETEROSCOPY;  Surgeon: Ceasar Mons, MD;  Location: [facility];  Service: Urology;  Laterality: Bilateral;  . ESOPHAGOGASTRODUODENOSCOPY (EGD) WITH PROPOFOL N/A 05/11/2014   Procedure: ESOPHAGOGASTRODUODENOSCOPY (EGD) WITH PROPOFOL;  Surgeon: Lear Ng, MD;  Location: [facility] ENDOSCOPY;  Service: Endoscopy;  Laterality: N/A;  egd first  . EUS N/A 06/21/2016   Procedure: ESOPHAGEAL ENDOSCOPIC ULTRASOUND (  EUS) RADIAL;  Surgeon: Arta Silence, MD;  Location: [facility] ENDOSCOPY;  Service: Endoscopy;  Laterality: N/A;  . FLEXIBLE SIGMOIDOSCOPY N/A 05/11/2014   Procedure: FLEXIBLE SIGMOIDOSCOPY;  Surgeon: Lear Ng, MD;  Location: [facility] ENDOSCOPY;  Service: Endoscopy;  Laterality: N/A;  . LAPAROTOMY W/  BILATERAL SALPINGOOPHORECTOMY  09-26-2003   dr Matthew Saras [facility]  . LAPROSCOPY W/ LYSIS ADHESIONS  06-08-2003   dr Radene Knee [facility]  . TRANSTHORACIC ECHOCARDIOGRAM  04/29/2016   ef 60-65%,  grade 2 diastolic dysfunction/  trivial MR and TR  . TUBAL LIGATION Bilateral yrs ago     reports that she has never smoked. She has never used smokeless tobacco. She reports current alcohol use. She reports that she does not use drugs.  Allergies  Allergen Reactions  . Latex Other (See Comments)    Peels skin  . Nsaids Other (See Comments)    HAS COLITIS FLARES  . Penicillins Hives    Has patient had a PCN reaction causing immediate rash, facial/tongue/throat swelling, SOB or lightheadedness with hypotension: Yes Has patient had a PCN reaction causing severe rash involving mucus membranes or skin necrosis: Unknown Has patient had a PCN reaction that required hospitalization: no Has patient had a PCN reaction occurring within the last 10 years: No If all of the above answers are "NO", then may proceed with Cephalosporin use.   . Wellbutrin [Bupropion Hcl] Other (See Comments)    Insomnia and headaches  . Aspirin Nausea Only  . Darvocet [Propoxyphene N-Acetaminophen] Nausea And Vomiting  . Isometheptene-Dichloral-Apap Nausea Only    IV dye?  Marland Kitchen Tape Hives  . Toradol [Ketorolac Tromethamine] Nausea And Vomiting  . Tylenol [Acetaminophen] Nausea Only  . Milk-Related Compounds     Can cause diarrhea  . Other     Spicy foods cause diarrhea due to colitis    Family History  Problem Relation Age of Onset  . Alzheimer's disease Mother   . Hypertension Mother   . Colon cancer Brother   . Hypertension Brother   . Other Father        ruptured appendix  . Breast cancer Sister   . Hypertension Sister   . Hypertension Sister   . Memory loss Brother   . Hypertension Sister   . Heart disease Sister     Prior to Admission medications   Medication Sig Start Date End Date Taking? Authorizing Provider   ALPRAZolam Duanne Moron) 1 MG tablet Take 1 mg by mouth 3 (three) times daily as needed for anxiety.    Yes [provider]  cyclobenzaprine (FLEXERIL) 10 MG tablet Take 10 mg by mouth as needed for muscle spasms. 03/30/20  Yes [provider]  diazepam (VALIUM) 5 MG tablet Take 5 mg by mouth 2 (two) times daily as needed for anxiety.  05/14/19  Yes [provider]  esomeprazole (NEXIUM) 40 MG capsule Take 40 mg by mouth daily before breakfast. 06/10/19  Yes [provider]  FLUoxetine (PROZAC) 20 MG capsule Take 20 mg by mouth daily.    Yes [provider]  Multiple Vitamins-Minerals (WOMENS MULTI PO) Take 1 tablet by mouth daily.   Yes [provider]  Oxycodone HCl 10 MG TABS Take 10 mg by mouth 3 (three) times daily as needed for pain. 05/21/17  Yes [provider]  Polyethyl Glycol-Propyl Glycol (SYSTANE OP) Place 1 drop into both eyes 3 (three) times daily as needed (dry eyes.).   Yes [provider]  triamterene-hydrochlorothiazide (MAXZIDE) 75-50 MG tablet  Take 1 tablet by mouth every morning. 03/30/20  Yes [provider]    Physical Exam: Vitals:   05/05/20 0910 05/05/20 1305 05/05/20 1350 05/05/20 1534  BP: (!) 151/86 125/82 139/81 126/86  Pulse: 96 93 90 95  Resp: 20 16 14 15   Temp:  98.7 F (37.1 C)  97.6 F (36.4 C)  TempSrc:    Oral  SpO2: 96% 97% 93% 99%  Weight:    99.8 kg  Height:    5\' 5"  (1.651 m)    Constitutional: NAD, calm, comfortable Vitals:   05/05/20 0910 05/05/20 1305 05/05/20 1350 05/05/20 1534  BP: (!) 151/86 125/82 139/81 126/86  Pulse: 96 93 90 95  Resp: 20 16 14 15   Temp:  98.7 F (37.1 C)  97.6 F (36.4 C)  TempSrc:    Oral  SpO2: 96% 97% 93% 99%  Weight:    99.8 kg  Height:    5\' 5"  (1.651 m)   Eyes: PERRL, lids and conjunctivae normal ENMT: Mucous membranes are dry. Posterior pharynx clear of any exudate or lesions.Normal dentition.  Neck: normal, supple, no masses, no  thyromegaly Respiratory: clear to auscultation bilaterally, no wheezing, no crackles. Normal respiratory effort. No accessory muscle use.  Cardiovascular: Regular rate and rhythm, s1, s2 Abdomen: no tenderness, no masses palpated. No hepatosplenomegaly. Bowel sounds positive.  Musculoskeletal: no clubbing / cyanosis. No joint deformity upper and lower extremities. Good ROM, no contractures. Normal muscle tone.  Skin: no rashes, lesions. No induration Neurologic: CN 2-12 grossly intact. Sensation intact, DTR normal. Strength 5/5 in all 4.  Psychiatric: Normal judgment and insight. Alert and oriented x 3. Normal mood.    Labs on Admission: I have personally reviewed following labs and imaging studies  CBC: Recent Labs  Lab 05/04/20 2349  WBC 5.7  HGB 14.4  HCT 45.5  MCV 80.7  PLT 979   Basic Metabolic Panel: Recent Labs  Lab 05/04/20 2349  NA 137  K 3.3*  CL 97*  CO2 26  GLUCOSE 113*  BUN 13  CREATININE 0.70  CALCIUM 9.4   GFR: Estimated Creatinine Clearance: 83.1 mL/min (by C-G formula based on SCr of 0.7 mg/dL). Liver Function Tests: Recent Labs  Lab 05/04/20 2349  AST 18  ALT 17  ALKPHOS 73  BILITOT 0.6  PROT 8.8*  ALBUMIN 4.1   Recent Labs  Lab 05/04/20 2349  LIPASE 22   No results for input(s): AMMONIA in the last 168 hours. Coagulation Profile: No results for input(s): INR, PROTIME in the last 168 hours. Cardiac Enzymes: No results for input(s): CKTOTAL, CKMB, CKMBINDEX, TROPONINI in the last 168 hours. BNP (last 3 results) No results for input(s): PROBNP in the last 8760 hours. HbA1C: No results for input(s): HGBA1C in the last 72 hours. CBG: No results for input(s): GLUCAP in the last 168 hours. Lipid Profile: No results for input(s): CHOL, HDL, LDLCALC, TRIG, CHOLHDL, LDLDIRECT in the last 72 hours. Thyroid Function Tests: No results for input(s): TSH, T4TOTAL, FREET4, T3FREE, THYROIDAB in the last 72 hours. Anemia Panel: No results for  input(s): VITAMINB12, FOLATE, FERRITIN, TIBC, IRON, RETICCTPCT in the last 72 hours. Urine analysis:    Component Value Date/Time   COLORURINE ORANGE (A) 05/05/2020 0101   APPEARANCEUR CLOUDY (A) 05/05/2020 0101   LABSPEC 1.020 05/05/2020 0101   PHURINE 6.5 05/05/2020 0101   GLUCOSEU NEGATIVE 05/05/2020 0101   HGBUR LARGE (A) 05/05/2020 0101   BILIRUBINUR SMALL (A) 05/05/2020 0101   KETONESUR 40 (A)  05/05/2020 0101   PROTEINUR 30 (A) 05/05/2020 0101   UROBILINOGEN 0.2 04/17/2015 0644   NITRITE NEGATIVE 05/05/2020 0101   LEUKOCYTESUR NEGATIVE 05/05/2020 0101   Sepsis Labs: !!!!!!!!!!!!!!!!!!!!!!!!!!!!!!!!!!!!!!!!!!!! @LABRCNTIP (procalcitonin:4,lacticidven:4) ) Recent Results (from the past 240 hour(s))  SARS Coronavirus 2 by RT PCR (hospital order, performed in [facility] hospital lab) Nasopharyngeal Nasopharyngeal Swab     Status: None   Collection Time: 05/05/20  4:52 AM   Specimen: Nasopharyngeal Swab  Result Value Ref Range Status   SARS Coronavirus 2 NEGATIVE NEGATIVE Final    Comment: (NOTE) SARS-CoV-2 target nucleic acids are NOT DETECTED.  The SARS-CoV-2 RNA is generally detectable in upper and lower respiratory specimens during the acute phase of infection. The lowest concentration of SARS-CoV-2 viral copies this assay can detect is 250 copies / mL. A negative result does not preclude SARS-CoV-2 infection and should not be used as the sole basis for treatment or other patient management decisions.  A negative result may occur with improper specimen collection / handling, submission of specimen other than nasopharyngeal swab, presence of viral mutation(s) within the areas targeted by this assay, and inadequate number of viral copies (<250 copies / mL). A negative result must be combined with clinical observations, patient history, and epidemiological information.  Fact Sheet for Patients:   StrictlyIdeas.no  Fact Sheet for Healthcare  Providers: BankingDealers.co.za  This test is not yet approved or  cleared by the [place] FDA and has been authorized for detection and/or diagnosis of SARS-CoV-2 by FDA under an Emergency Use Authorization (EUA).  This EUA will remain in effect (meaning this test can be used) for the duration of the COVID-19 declaration under Section 564(b)(1) of the Act, 21 U.S.C. section 360bbb-3(b)(1), unless the authorization is terminated or revoked sooner.  Performed at [facility], [address] [place], [place] [address]      Radiological Exams on Admission: CT ABDOMEN PELVIS W CONTRAST  Result Date: 05/05/2020 CLINICAL DATA:  64 year old female with right-sided abdominal pain. EXAM: CT ABDOMEN AND PELVIS WITH CONTRAST TECHNIQUE: Multidetector CT imaging of the abdomen and pelvis was performed using the standard protocol following bolus administration of intravenous contrast. CONTRAST:  164mL OMNIPAQUE IOHEXOL 300 MG/ML  SOLN COMPARISON:  CT abdomen pelvis dated 04/09/2020. FINDINGS: Lower chest: The visualized lung bases are clear. No intra-abdominal free air or free fluid. Hepatobiliary: Apparent mild fatty infiltration of the liver. There is intrahepatic biliary ductal dilatation progressed since the study of 2018. Cholecystectomy. No retained calcified stone noted in the central CBD. Pancreas: There is dilatation of the main pancreatic duct measuring up to approximately 6 mm. There is apparent 15 x 17 mm soft tissue fullness in the region of the ampulla (24/2 and 44/5). Further evaluation with MRI/MRCP is recommended to exclude an ampullary mass. There is mild distal pancreatic atrophy. Spleen: Normal in size without focal abnormality. Adrenals/Urinary Tract: The right adrenal gland is unremarkable. There is a 1 cm fatty left adrenal nodule, likely an adenoma. The kidneys appear unremarkable. There is no hydronephrosis on either side. There is symmetric  enhancement and excretion of contrast by both kidneys. The visualized ureters and urinary bladder appear unremarkable. Stomach/Bowel: There is sigmoid diverticulosis without active inflammatory changes. There is loose stool throughout the colon compatible with diarrheal state. Correlation with clinical exam and stool cultures recommended. There is no bowel obstruction. The appendix is normal. Vascular/Lymphatic: The abdominal aorta and IVC are unremarkable. No portal venous gas. There is no adenopathy. Reproductive: Hysterectomy.  No adnexal masses. Other: None Musculoskeletal: Degenerative changes of the spine. No acute osseous pathology. IMPRESSION: 1. Interval progression of the intrahepatic biliary ductal dilatation since the study of 2018. 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 Further evaluation with MRI/MRCP is recommended. 2. Diarrheal state. Correlation with clinical exam and stool cultures recommended. No bowel obstruction. Normal appendix. 3. Colonic diverticulosis. 4. A 1 cm fatty left adrenal nodule, likely an adenoma. Electronically Signed   By: Anner Crete M.D.   On: 05/05/2020 01:42    EKG: Independently reviewed. Most recent EKG from 07/14/19 with sinus rhythm  Assessment/Plan Principal Problem:   Abdominal pain Active Problems:   HTN (hypertension), benign   Hyperlipidemia   Fibroids   Ulcerative colitis (H[facility])   Hypokalemia   Dehydration   1. Abd pain with biliary ductal diliation 1. Noted over past two days prior to admit 2. Pt is s/p cholecystectomy 3. CT abd reviewed, findings worrisome for interval progression of intrahepatic biliary ductal dilation since 2018 study with findings concerning for ampullary mass 4. GI consulted, recommendation for f/u MRCP, pending 5. Tumor markers ordered, pending 6. LFT's within normal limits 7. Repeat cmp in AM 2. HTN 1. BP currently stable 2. Not on BP meds at  this time 3. HLD 1. Seems to be stable at this time 4. Hx fibroids 1. Pt is s/p prior hysterectomy 2. Followed by Ob/GYN as outpatient 5. Hx UC 1. Seems to be stable 2. No active inflammatory changes seen on CT abd 6. Dehydration 1. Dry mucus membranes and decreased skin turgor 2. Likely secondary to decreased PO intake as per above 3. Will continue on gentle IVF hydration  DVT prophylaxis: Lovenox subq  Code Status: Full Family Communication: Pt in room, family not at bedside  Disposition Plan: uncertain at this time  Consults called: GI Admission status: Will likely require inpt status as pt will likely require greater than 2 midnight stay for further biliary work up and continuous IVF to treat dehydration which if left untreated, can lead to increased morbidity  Marylu Lund MD [facility] Pager On Amion  If 7PM-7AM, please contact night-coverage  05/05/2020, 5:24 PM

## 2020-05-05 NOTE — H&P (View-Only) (Signed)
Referring Provider: No ref. provider found Primary Care Physician:  Shirline Frees, MD Primary Gastroenterologist:  Dr. Penelope Coop [facility])  Reason for Consultation:  RUQ abdominal pain, abnormal CT  HPI: Erin Wolf is a 64 y.o. female with past medical history below noted to include cholecystectomy (1978) and remote left-sided ulcerative colitis presenting for consultation of worsening right upper quadrant abdominal pain.  She has had intermittent RUQ abdominal pain which has been more persistent over the past two months and acutely worsened over the past two days. She describes the pain as crampy and intermittently sharp.    She has also experienced decreased appetite, nausea, and vomiting. She has been unable to eat or drink anything for the last several days due to nausea and vomiting.  She reports weight loss of 10-12 lbs lbs over the last month.  Has intermittent small amounts of bright red blood on the toilet paper.  She has also noticed mucus and loose stools over the past 2 days.  Reports a brother with colon cancer (dx at age 56).  No other known family history of gastrointestinal malignancy.  CT 05/05/20: Interval progression of the intrahepatic biliary ductal dilatation since the study of 2018. 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 Further evaluation with MRI/MRCP is recommended.  Patient had an EUS in 06/2016 for CBD dilation and pancreatic duct dilation: Prominent ampulla, but no evidence of ampullary mass. There was dilation in the common bile duct which measured up to 8 mm. There was no sign of significant pathology in the pancreatic head.  Evidence of a cholecystectomy. There was no evidence of significant pathology in the left lobe of the liver.  Colonoscopy 05/2019: Random biopsies for surveillance, negative for dysplasia.  No polyps.  Past Medical History:  Diagnosis Date   Anxiety    Arthritis     Chronic abdominal pain    Chronic lower back pain    Chronic nausea    Depression    Diabetes mellitus without complication (HCC)    Diverticulosis    Fibromyalgia    Frequency of urination    GERD (gastroesophageal reflux disease)    Headache    Hematuria    Hepatitis C antibody positive in blood    per pt told by pcp   History of panic attacks    History of syncope    hx recurrent syncope --- per epic domentation non-cardiac , orthostatic hypotension, anxiety, dehydration   History of uterine fibroid    HTN (hypertension), benign    Hyperlipidemia 01/03/2012   IBS (irritable bowel syndrome)    Myalgia    Seizure disorder ([place]) followed by pcp until new neurologist appr. in jan 2019   "started having them in my 20's" ,  petit mal ----  per pt last seizure one 2015   SLE (systemic lupus erythematosus) [facility])    rheumatologist-- dr Gerilyn Nestle (consult 11-12-2016) having work-up done   Ulcerative colitis    followed by dr Penelope Coop at [facility]   Wears glasses     Past Surgical History:  Procedure Laterality Date   [place]   COLONOSCOPY N/A 05/31/2013   Procedure: COLONOSCOPY;  Surgeon: Wonda Horner, MD;  Location: [facility] ENDOSCOPY;  Service: Endoscopy;  Laterality: N/A;   CYSTOSCOPY/RETROGRADE/URETEROSCOPY Bilateral 07/23/2017   Procedure: CYSTOSCOPY/RETROGRADE/URETEROSCOPY;  Surgeon: Ceasar Mons, MD;  Location: [facility];  Service: Urology;  Laterality:  Bilateral;   ESOPHAGOGASTRODUODENOSCOPY (EGD) WITH PROPOFOL N/A 05/11/2014   Procedure: ESOPHAGOGASTRODUODENOSCOPY (EGD) WITH PROPOFOL;  Surgeon: Lear Ng, MD;  Location: [facility] ENDOSCOPY;  Service: Endoscopy;  Laterality: N/A;  egd first   EUS N/A 06/21/2016   Procedure: ESOPHAGEAL ENDOSCOPIC ULTRASOUND (EUS) RADIAL;  Surgeon: Arta Silence, MD;  Location: [facility] ENDOSCOPY;  Service: Endoscopy;  Laterality: N/A;   FLEXIBLE  SIGMOIDOSCOPY N/A 05/11/2014   Procedure: FLEXIBLE SIGMOIDOSCOPY;  Surgeon: Lear Ng, MD;  Location: [facility] ENDOSCOPY;  Service: Endoscopy;  Laterality: N/A;   LAPAROTOMY W/ BILATERAL SALPINGOOPHORECTOMY  09-26-2003   dr Matthew Saras at [place]  06-08-2003   dr Radene Knee [place] ECHOCARDIOGRAM  04/29/2016   ef 60-65%,  grade 2 diastolic dysfunction/  trivial MR and TR   TUBAL LIGATION Bilateral yrs ago    Prior to Admission medications   Medication Sig Start Date End Date Taking? Authorizing Provider  ALPRAZolam Duanne Moron) 1 MG tablet Take 1 mg by mouth 3 (three) times daily as needed for anxiety.    Yes [provider]  cyclobenzaprine (FLEXERIL) 10 MG tablet Take 10 mg by mouth as needed for muscle spasms. 03/30/20  Yes [provider]  diazepam (VALIUM) 5 MG tablet Take 5 mg by mouth 2 (two) times daily as needed for anxiety.  05/14/19  Yes [provider]  esomeprazole (NEXIUM) 40 MG capsule Take 40 mg by mouth daily before breakfast. 06/10/19  Yes [provider]  FLUoxetine (PROZAC) 20 MG capsule Take 20 mg by mouth daily.    Yes [provider]  Multiple Vitamins-Minerals (WOMENS MULTI PO) Take 1 tablet by mouth daily.   Yes [provider]  Oxycodone HCl 10 MG TABS Take 10 mg by mouth 3 (three) times daily as needed for pain. 05/21/17  Yes [provider]  Polyethyl Glycol-Propyl Glycol (SYSTANE OP) Place 1 drop into both eyes 3 (three) times daily as needed (dry eyes.).   Yes [provider]  triamterene-hydrochlorothiazide (MAXZIDE) 75-50 MG tablet Take 1 tablet by mouth every morning. 03/30/20  Yes [provider]    Current Facility-Administered Medications  Medication Dose Route Frequency Provider Last Rate Last Admin   sodium chloride flush (NS) 0.9 % injection 3 mL  3 mL Intravenous Once Molpus, John, MD        Allergies as of 05/04/2020 - Review Complete  05/04/2020  Allergen Reaction Noted   Latex Other (See Comments) 06/20/2016   Nsaids Other (See Comments) 06/29/2016   Penicillins Hives 05/07/2011   Wellbutrin [bupropion hcl] Other (See Comments) 05/07/2011   Aspirin Nausea Only 05/07/2011   Darvocet [propoxyphene n-acetaminophen] Nausea And Vomiting 05/07/2011   Isometheptene-dichloral-apap Nausea Only 05/07/2011   Tape Hives 11/09/2013   Toradol [ketorolac tromethamine] Nausea And Vomiting 03/19/2016   Tylenol [acetaminophen] Nausea Only 10/22/2014   Milk-related compounds  08/01/2017   Other  08/01/2017    Family History  Problem Relation Age of Onset   Alzheimer's disease Mother    Hypertension Mother    Colon cancer Brother    Hypertension Brother    Other Father        ruptured appendix   Breast cancer Sister    Hypertension Sister    Hypertension Sister    Memory loss Brother    Hypertension Sister    Heart disease Sister     Social History   Socioeconomic History   Marital status: Married    Spouse name: Not on  file   Number of children: 1   Years of education: 2 years college   Highest education level: Not on file  Occupational History   Occupation: Retired  Tobacco Use   Smoking status: Never Smoker   Smokeless tobacco: Never Used  Scientific laboratory technician Use: Never used  Substance and Sexual Activity   Alcohol use: Yes    Comment: occ   Drug use: No   Sexual activity: Not on file  Other Topics Concern   Not on file  Social History Narrative   Lives at home with husband.   Right-handed.   No caffeine use.   Social Determinants of Health   Financial Resource Strain:    Difficulty of Paying Living Expenses:   Food Insecurity:    Worried About Charity fundraiser in the Last Year:    Arboriculturist in the Last Year:   Transportation Needs:    Film/video editor (Medical):    Lack of Transportation (Non-Medical):   Physical Activity:    Days of  Exercise per Week:    Minutes of Exercise per Session:   Stress:    Feeling of Stress :   Social Connections:    Frequency of Communication with Friends and Family:    Frequency of Social Gatherings with Friends and Family:    Attends Religious Services:    Active Member of Clubs or Organizations:    Attends Music therapist:    Marital Status:   Intimate Partner Violence:    Fear of Current or Ex-Partner:    Emotionally Abused:    Physically Abused:    Sexually Abused:     Review of Systems: Review of Systems  Constitutional: Positive for malaise/fatigue and weight loss. Negative for chills and fever.  HENT: Negative for hearing loss and tinnitus.   Eyes: Negative for pain and redness.  Respiratory: Negative for cough and shortness of breath.   Cardiovascular: Negative for chest pain and palpitations.  Gastrointestinal: Positive for abdominal pain, diarrhea, nausea and vomiting. Negative for blood in stool, constipation, heartburn and melena.  Genitourinary: Positive for hematuria. Negative for flank pain.  Musculoskeletal: Positive for back pain. Negative for falls and joint pain.  Skin: Negative for itching and rash.  Neurological: Negative for seizures and loss of consciousness.  Endo/Heme/Allergies: Negative for polydipsia. Does not bruise/bleed easily.  Psychiatric/Behavioral: Negative for memory loss and substance abuse.   Physical Exam: Vital signs in last 24 hours: Temp:  [97.6 F (36.4 C)-98.7 F (37.1 C)] 97.6 F (36.4 C) (07/16 1534) Pulse Rate:  [90-118] 95 (07/16 1534) Resp:  [14-20] 15 (07/16 1534) BP: (118-151)/(68-86) 126/86 (07/16 1534) SpO2:  [93 %-100 %] 99 % (07/16 1534) Weight:  [95.3 kg-99.8 kg] 99.8 kg (07/16 1534)   General:  Lethargic,  Well-developed, well-nourished, pleasant and cooperative in NAD Head: Normocephalic and atraumatic. Eyes: Sclera clear, no icterus.   Conjunctiva pink. Mouth:  No ulcerations or lesions.   Oropharynx pink & moist. Neck:  No masses or thyromegaly. Lungs: Clear throughout to auscultation.   No wheezes, crackles, or rhonchi. No evident respiratory distress. Heart:  Regular rate and rhythm; no murmurs, clicks, rubs,  or gallops. Abdomen: Soft, moderate RUQ tenderness with guarding, nontympanitic, and nondistended. No masses, hepatosplenomegaly or ventral hernias noted. Normal bowel sounds, without bruits. Msk: Symmetrical without gross deformities. Pulses:Normal pulses noted. Extremities:  Without clubbing, cyanosis, or edema. Neurologic: Alert and coherent;  grossly normal neurologically. Skin: Intact without significant  lesions or rashes. Cervical Nodes: No significant cervical adenopathy. Psych:  Cooperative. Normal mood and affect.  Intake/Output from previous day: No intake/output data recorded. Intake/Output this shift: No intake/output data recorded.  Lab Results: Recent Labs    05/04/20 2349  WBC 5.7  HGB 14.4  HCT 45.5  PLT 345   BMET Recent Labs    05/04/20 2349  NA 137  K 3.3*  CL 97*  CO2 26  GLUCOSE 113*  BUN 13  CREATININE 0.70  CALCIUM 9.4   LFT Recent Labs    05/04/20 2349  PROT 8.8*  ALBUMIN 4.1  AST 18  ALT 17  ALKPHOS 73  BILITOT 0.6   PT/INR No results for input(s): LABPROT, INR in the last 72 hours.  Studies/Results: CT ABDOMEN PELVIS W CONTRAST  Result Date: 05/05/2020 CLINICAL DATA:  64 year old female with right-sided abdominal pain. EXAM: CT ABDOMEN AND PELVIS WITH CONTRAST TECHNIQUE: Multidetector CT imaging of the abdomen and pelvis was performed using the standard protocol following bolus administration of intravenous contrast. CONTRAST:  130mL OMNIPAQUE IOHEXOL 300 MG/ML  SOLN COMPARISON:  CT abdomen pelvis dated 04/09/2020. FINDINGS: Lower chest: The visualized lung bases are clear. No intra-abdominal free air or free fluid. Hepatobiliary: Apparent mild fatty infiltration of the liver. There is intrahepatic biliary  ductal dilatation progressed since the study of 2018. Cholecystectomy. No retained calcified stone noted in the central CBD. Pancreas: There is dilatation of the main pancreatic duct measuring up to approximately 6 mm. There is apparent 15 x 17 mm soft tissue fullness in the region of the ampulla (24/2 and 44/5). Further evaluation with MRI/MRCP is recommended to exclude an ampullary mass. There is mild distal pancreatic atrophy. Spleen: Normal in size without focal abnormality. Adrenals/Urinary Tract: The right adrenal gland is unremarkable. There is a 1 cm fatty left adrenal nodule, likely an adenoma. The kidneys appear unremarkable. There is no hydronephrosis on either side. There is symmetric enhancement and excretion of contrast by both kidneys. The visualized ureters and urinary bladder appear unremarkable. Stomach/Bowel: There is sigmoid diverticulosis without active inflammatory changes. There is loose stool throughout the colon compatible with diarrheal state. Correlation with clinical exam and stool cultures recommended. There is no bowel obstruction. The appendix is normal. Vascular/Lymphatic: The abdominal aorta and IVC are unremarkable. No portal venous gas. There is no adenopathy. Reproductive: Hysterectomy. No adnexal masses. Other: None Musculoskeletal: Degenerative changes of the spine. No acute osseous pathology. IMPRESSION: 1. Interval progression of the intrahepatic biliary ductal dilatation since the study of 2018. 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 Further evaluation with MRI/MRCP is recommended. 2. Diarrheal state. Correlation with clinical exam and stool cultures recommended. No bowel obstruction. Normal appendix. 3. Colonic diverticulosis. 4. A 1 cm fatty left adrenal nodule, likely an adenoma. Electronically Signed   By: Anner Crete M.D.   On: 05/05/2020 01:42    Impression: RUQ Abdominal pain, abnormal  CT -CT 05/05/20: Interval progression of the intrahepatic biliary ductal dilatation since the study of 2018. 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 Further evaluation with MRI/MRCP is recommended. -As of 05/04/20: LFTs remain within normal limits: T. Bili 0.6/ AST 18/ ALT 17/ ALP 73 with normal lipase of 22.  No leukocytosis (WBCs 5.7). -Remote cholecystectomy (1978) -Patient had an EUS in 06/2016 with prominent ampulla, but no evidence of ampullary mass and no sign of significant pathology  in the pancreatic head.  Weight loss of 10-12 lbs in 1 month  Remote history of left-sided ulcerative colitis, seemingly in remission (patient if off medication)  Family history of colon cancer (brother, age 63); minimal BRBPR, no anemia, up to date on colonoscopy (suspect hemorrhoidal)  Plan: MRCP to further evaluate abnormal CT as noted above.  Plan for EUS on Monday pending MRCP findings.  Repeat CBC, CMP, lipase tomorrow.    Ordered Ca 19-9 for tomorrow.  Anti-emetics and pain control PRN.  [facility] will follow.   LOS: 0 days   Salley Slaughter  05/05/2020, 5:11 PM [facility] [phone]

## 2020-05-05 NOTE — ED Provider Notes (Signed)
[facility] Provider Note: Erin Spurling, MD, FACEP  CSN: [id number] MRN: [id number] ARRIVAL: 05/04/20 at 2115 ROOM: [place]  Abdominal Pain   HISTORY OF PRESENT ILLNESS  05/05/20 12:05 AM Erin Wolf is a 64 y.o. female who has had intermittent right-sided abdominal pain for several months.  She was worked up in the ED on 04/08/2020 and had a CT scan which was unremarkable except for a dilated pancreatic duct.  She has been in contact with her gastroenterologist, Dr. Penelope Coop, with whom she has an appointment this coming Monday (3 days from today).  He advised that she come to the ED.  Specifically, she is here with 2 days of a worsening episode of right-sided abdominal pain.  She rates the pain is a 10 out of 10, worse with palpation or movement.  It is been associated with decreased appetite, nausea and vomiting.  The nausea and vomiting are brought on by eating.  This is the worst episode she has had.  She is worried that if she is not able to eat it will interfere with managing her diabetes and she wishes to keep her A1c low enough to avoid insulin.  She is a chronic pain patient, primarily related to her left knee in addition to her abdominal pain, who receives 180, 10 mg oxycodone tablets monthly (that is, 60 mg of oxycodone daily), and 60, 5 mg diazepam tablets monthly (that is, 10 mg of diazepam daily).  She has had intermittent blood in her underwear for several weeks.  She does not know if this is from urine or vagina.  She is status post hysterectomy and cholecystectomy.   Past Medical History:  Diagnosis Date  . Anxiety   . Arthritis   . Chronic abdominal pain   . Chronic lower back pain   . Chronic nausea   . Depression   . Diabetes mellitus without complication ([place])   . Diverticulosis   . Fibromyalgia   . Frequency of urination   . GERD (gastroesophageal reflux disease)   . Headache   . Hematuria   . Hepatitis C antibody  positive in blood    per pt told by pcp  . History of panic attacks   . History of syncope    hx recurrent syncope --- per epic domentation non-cardiac , orthostatic hypotension, anxiety, dehydration  . History of uterine fibroid   . HTN (hypertension), benign   . Hyperlipidemia 01/03/2012  . IBS (irritable bowel syndrome)   . Myalgia   . Seizure disorder ([place]) followed by pcp until new neurologist appr. in jan 2019   "started having them in my 20's" ,  petit mal ----  per pt last seizure one 2015  . SLE (systemic lupus erythematosus) [facility])    rheumatologist-- dr Gerilyn Nestle (consult 11-12-2016) having work-up done  . Ulcerative colitis    followed by dr Penelope Coop at [place]  . Wears glasses     Past Surgical History:  Procedure Laterality Date  . ABDOMINAL HYSTERECTOMY  1988  . CHOLECYSTECTOMY OPEN  1978  . COLONOSCOPY N/A 05/31/2013   Procedure: COLONOSCOPY;  Surgeon: Wonda Horner, MD;  Location: [facility] ENDOSCOPY;  Service: Endoscopy;  Laterality: N/A;  . CYSTOSCOPY/RETROGRADE/URETEROSCOPY Bilateral 07/23/2017   Procedure: CYSTOSCOPY/RETROGRADE/URETEROSCOPY;  Surgeon: Ceasar Mons, MD;  Location: [facility];  Service: Urology;  Laterality: Bilateral;  . ESOPHAGOGASTRODUODENOSCOPY (EGD) WITH PROPOFOL N/A 05/11/2014   Procedure: ESOPHAGOGASTRODUODENOSCOPY (EGD) WITH PROPOFOL;  Surgeon: Lear Ng,  MD;  Location: [facility] ENDOSCOPY;  Service: Endoscopy;  Laterality: N/A;  egd first  . EUS N/A 06/21/2016   Procedure: ESOPHAGEAL ENDOSCOPIC ULTRASOUND (EUS) RADIAL;  Surgeon: Arta Silence, MD;  Location: [facility] ENDOSCOPY;  Service: Endoscopy;  Laterality: N/A;  . FLEXIBLE SIGMOIDOSCOPY N/A 05/11/2014   Procedure: FLEXIBLE SIGMOIDOSCOPY;  Surgeon: Lear Ng, MD;  Location: [facility] ENDOSCOPY;  Service: Endoscopy;  Laterality: N/A;  . LAPAROTOMY W/ BILATERAL SALPINGOOPHORECTOMY  09-26-2003   dr Matthew Saras at [facility]  . LAPROSCOPY W/ LYSIS ADHESIONS  06-08-2003   dr Radene Knee [facility]  .  TRANSTHORACIC ECHOCARDIOGRAM  04/29/2016   ef 60-65%,  grade 2 diastolic dysfunction/  trivial MR and TR  . TUBAL LIGATION Bilateral yrs ago    Family History  Problem Relation Age of Onset  . Alzheimer's disease Mother   . Hypertension Mother   . Colon cancer Brother   . Hypertension Brother   . Other Father        ruptured appendix  . Breast cancer Sister   . Hypertension Sister   . Hypertension Sister   . Memory loss Brother   . Hypertension Sister   . Heart disease Sister     Social History   Tobacco Use  . Smoking status: Never Smoker  . Smokeless tobacco: Never Used  Vaping Use  . Vaping Use: Never used  Substance Use Topics  . Alcohol use: Yes    Comment: occ  . Drug use: No    Prior to Admission medications   Medication Sig Start Date End Date Taking? Authorizing Provider  ALPRAZolam Duanne Moron) 1 MG tablet alprazolam 1 mg tablet    [provider]  cyclobenzaprine (FLEXERIL) 10 MG tablet Take 10 mg by mouth as needed for muscle spasms. 03/30/20   [provider]  diazepam (VALIUM) 5 MG tablet Take 5 mg by mouth 2 (two) times daily as needed. 05/14/19   [provider]  esomeprazole (NEXIUM) 40 MG capsule Take 40 mg by mouth daily before breakfast. 06/10/19   [provider]  FLUoxetine (PROZAC) 20 MG capsule Take 20 mg by mouth daily.     [provider]  Multiple Vitamins-Minerals (WOMENS MULTI PO) Take 1 tablet by mouth daily.    [provider]  Oxycodone HCl 10 MG TABS Take 10 mg by mouth 3 (three) times daily as needed for pain. 05/21/17   [provider]  Polyethyl Glycol-Propyl Glycol (SYSTANE OP) Place 1 drop into both eyes 3 (three) times daily as needed (dry eyes.).    [provider]  triamterene-hydrochlorothiazide (MAXZIDE) 75-50 MG tablet Take 1 tablet by mouth every morning. 03/30/20   [provider]    Allergies Latex, Nsaids, Penicillins, Wellbutrin [bupropion hcl],  Aspirin, Darvocet [propoxyphene n-acetaminophen], Isometheptene-dichloral-apap, Tape, Toradol [ketorolac tromethamine], Tylenol [acetaminophen], Milk-related compounds, and Other   REVIEW OF SYSTEMS  Negative except as noted here or in the History of Present Illness.   PHYSICAL EXAMINATION  Initial Vital Signs Blood pressure 120/82, pulse 98, temperature 98.4 F (36.9 C), temperature source Oral, resp. rate 19, height 5\' 5"  (1.651 m), weight 95.3 kg, SpO2 99 %.  Examination General: Well-developed, well-nourished female in no acute distress; appearance consistent with age of record HENT: normocephalic; atraumatic Eyes: pupils equal, round and reactive to light; extraocular muscles intact Neck: supple Heart: regular rate and rhythm Lungs: clear to auscultation bilaterally Abdomen: soft; nondistended; right sided tenderness; bowel sounds present Extremities: No deformity; full range of motion; pulses normal Neurologic: Awake, alert  and oriented; motor function intact in all extremities and symmetric; no facial droop Skin: Warm and dry Psychiatric: Tearful   RESULTS  Summary of this visit's results, reviewed and interpreted by myself:   EKG Interpretation  Date/Time:    Ventricular Rate:    PR Interval:    QRS Duration:   QT Interval:    QTC Calculation:   R Axis:     Text Interpretation:        Laboratory Studies: Results for orders placed or performed during the hospital encounter of 05/04/20 (from the past 24 hour(s))  Lipase, blood     Status: None   Collection Time: 05/04/20 11:49 PM  Result Value Ref Range   Lipase 22 11 - 51 U/L  Comprehensive metabolic panel     Status: Abnormal   Collection Time: 05/04/20 11:49 PM  Result Value Ref Range   Sodium 137 135 - 145 mmol/L   Potassium 3.3 (L) 3.5 - 5.1 mmol/L   Chloride 97 (L) 98 - 111 mmol/L   CO2 26 22 - 32 mmol/L   Glucose, Bld 113 (H) 70 - 99 mg/dL   BUN 13 8 - 23 mg/dL   Creatinine, Ser 0.70 0.44 - 1.00  mg/dL   Calcium 9.4 8.9 - 10.3 mg/dL   Total Protein 8.8 (H) 6.5 - 8.1 g/dL   Albumin 4.1 3.5 - 5.0 g/dL   AST 18 15 - 41 U/L   ALT 17 0 - 44 U/L   Alkaline Phosphatase 73 38 - 126 U/L   Total Bilirubin 0.6 0.3 - 1.2 mg/dL   GFR calc non Af Amer >60 >60 mL/min   GFR calc Af Amer >60 >60 mL/min   Anion gap 14 5 - 15  CBC     Status: Abnormal   Collection Time: 05/04/20 11:49 PM  Result Value Ref Range   WBC 5.7 4.0 - 10.5 K/uL   RBC 5.64 (H) 3.87 - 5.11 MIL/uL   Hemoglobin 14.4 12.0 - 15.0 g/dL   HCT 45.5 36 - 46 %   MCV 80.7 80.0 - 100.0 fL   MCH 25.5 (L) 26.0 - 34.0 pg   MCHC 31.6 30.0 - 36.0 g/dL   RDW 14.7 11.5 - 15.5 %   Platelets 345 150 - 400 K/uL   nRBC 0.0 0.0 - 0.2 %  Urinalysis, Routine w reflex microscopic     Status: Abnormal   Collection Time: 05/05/20  1:01 AM  Result Value Ref Range   Color, Urine ORANGE (A) YELLOW   APPearance CLOUDY (A) CLEAR   Specific Gravity, Urine 1.020 1.005 - 1.030   pH 6.5 5.0 - 8.0   Glucose, UA NEGATIVE NEGATIVE mg/dL   Hgb urine dipstick LARGE (A) NEGATIVE   Bilirubin Urine SMALL (A) NEGATIVE   Ketones, ur 40 (A) NEGATIVE mg/dL   Protein, ur 30 (A) NEGATIVE mg/dL   Nitrite NEGATIVE NEGATIVE   Leukocytes,Ua NEGATIVE NEGATIVE  Urinalysis, Microscopic (reflex)     Status: Abnormal   Collection Time: 05/05/20  1:01 AM  Result Value Ref Range   RBC / HPF >50 0 - 5 RBC/hpf   WBC, UA 0-5 0 - 5 WBC/hpf   Bacteria, UA MANY (A) NONE SEEN   Squamous Epithelial / LPF 6-10 0 - 5   Imaging Studies: CT ABDOMEN PELVIS W CONTRAST  Result Date: 05/05/2020 CLINICAL DATA:  64 year old female with right-sided abdominal pain. EXAM: CT ABDOMEN AND PELVIS WITH CONTRAST TECHNIQUE: Multidetector CT imaging of the abdomen and  pelvis was performed using the standard protocol following bolus administration of intravenous contrast. CONTRAST:  147mL OMNIPAQUE IOHEXOL 300 MG/ML  SOLN COMPARISON:  CT abdomen pelvis dated 04/09/2020. FINDINGS: Lower chest:  The visualized lung bases are clear. No intra-abdominal free air or free fluid. Hepatobiliary: Apparent mild fatty infiltration of the liver. There is intrahepatic biliary ductal dilatation progressed since the study of 2018. Cholecystectomy. No retained calcified stone noted in the central CBD. Pancreas: There is dilatation of the main pancreatic duct measuring up to approximately 6 mm. There is apparent 15 x 17 mm soft tissue fullness in the region of the ampulla (24/2 and 44/5). Further evaluation with MRI/MRCP is recommended to exclude an ampullary mass. There is mild distal pancreatic atrophy. Spleen: Normal in size without focal abnormality. Adrenals/Urinary Tract: The right adrenal gland is unremarkable. There is a 1 cm fatty left adrenal nodule, likely an adenoma. The kidneys appear unremarkable. There is no hydronephrosis on either side. There is symmetric enhancement and excretion of contrast by both kidneys. The visualized ureters and urinary bladder appear unremarkable. Stomach/Bowel: There is sigmoid diverticulosis without active inflammatory changes. There is loose stool throughout the colon compatible with diarrheal state. Correlation with clinical exam and stool cultures recommended. There is no bowel obstruction. The appendix is normal. Vascular/Lymphatic: The abdominal aorta and IVC are unremarkable. No portal venous gas. There is no adenopathy. Reproductive: Hysterectomy. No adnexal masses. Other: None Musculoskeletal: Degenerative changes of the spine. No acute osseous pathology. IMPRESSION: 1. Interval progression of the intrahepatic biliary ductal dilatation since the study of 2018. 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 Further evaluation with MRI/MRCP is recommended. 2. Diarrheal state. Correlation with clinical exam and stool cultures recommended. No bowel obstruction. Normal appendix. 3. Colonic diverticulosis. 4. A 1 cm  fatty left adrenal nodule, likely an adenoma. Electronically Signed   By: Anner Crete M.D.   On: 05/05/2020 01:42    ED COURSE and MDM  Nursing notes, initial and subsequent vitals signs, including pulse oximetry, reviewed and interpreted by myself.  Vitals:   05/04/20 2131 05/04/20 2357 05/05/20 0226 05/05/20 0400  BP:  120/82 129/82 132/78  Pulse:  98 96 94  Resp:  19 14 18   Temp:  98.4 F (36.9 C) 97.9 F (36.6 C) 97.9 F (36.6 C)  TempSrc:  Oral Oral Oral  SpO2:  99% 100% 98%  Weight: 95.3 kg     Height: 5\' 5"  (1.651 m)      Medications  sodium chloride flush (NS) 0.9 % injection 3 mL (3 mLs Intravenous Not Given 05/04/20 2252)  ondansetron (ZOFRAN) injection 4 mg (4 mg Intravenous Given 05/05/20 0042)  HYDROmorphone (DILAUDID) injection 1 mg (1 mg Intravenous Given 05/05/20 0042)  iohexol (OMNIPAQUE) 300 MG/ML solution 100 mL (100 mLs Intravenous Contrast Given 05/05/20 0116)  HYDROmorphone (DILAUDID) injection 1 mg (1 mg Intravenous Given 05/05/20 0455)   4:36 AM Discussed with Dr. Michail Sermon of [facility] gastroenterology, partner with Dr. Penelope Coop. He agrees the patient will need an MRCP. He will see that the patient is seen in consult if she is admitted to [facility] long.   5:27 AM Dr. Hal Hope to admit patient.   PROCEDURES  Procedures   ED DIAGNOSES     ICD-10-CM   1. Observation for suspected cancer  Z03.89   2. Chronic abdominal pain  R10.9    G89.29   3. Chronic pain of left knee  M25.562  G89.29   4. Gross hematuria  R31.0    .    Jameeka Marcy, Jenny Reichmann, MD 05/05/20 [phone]

## 2020-05-05 NOTE — ED Notes (Signed)
Pt  transeferd to [facility] via [place],

## 2020-05-05 NOTE — Progress Notes (Deleted)
Patient is at [facility], awaiting transfer to [place] or [facility].  Patient is a 64 year old female who is status post cholecystectomy who, per chart review, presented to [place] with worsening right upper quadrant abdominal pain over the past two days. She has also experienced decreased appetite, nausea, and vomiting.   CT 05/05/20: Interval progression of the intrahepatic biliary ductal dilatation since the study of 2018. 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 Further evaluation with MRI/MRCP is recommended.  As of 05/04/20: LFTs remain within normal limits: T. Bili 0.6/ AST 18/ ALT 17/ ALP 73 with normal lipase of 22.  No leukocytosis (WBCs 5.7).  EUS 06/2016 for CBD dilation and pancreatic duct dilation: Prominent ampulla, but no evidence of ampullary mass. There was dilation in the common bile duct which measured up to 8 mm. There was no sign of significant pathology in the pancreatic head.  Evidence of a cholecystectomy. There was no evidence of significant pathology in the left lobe of the liver.  Recommendations:  Upon arrival, recommend repeat MRCP.  [facility] will see the patient after transfer.  Salley Slaughter, PA-C [facility] [phone]

## 2020-05-05 NOTE — ED Notes (Signed)
Pt was eating ice chips and drank a little bit of the water which caused her to vomit.

## 2020-05-05 NOTE — Consult Note (Addendum)
Referring Provider: No ref. provider found Primary Care Physician:  Shirline Frees, MD Primary Gastroenterologist:  Dr. Penelope Coop [facility])  Reason for Consultation:  RUQ abdominal pain, abnormal CT  HPI: Erin Wolf is a 64 y.o. female with past medical history below noted to include cholecystectomy (1978) and remote left-sided ulcerative colitis presenting for consultation of worsening right upper quadrant abdominal pain.  She has had intermittent RUQ abdominal pain which has been more persistent over the past two months and acutely worsened over the past two days. She describes the pain as crampy and intermittently sharp.    She has also experienced decreased appetite, nausea, and vomiting. She has been unable to eat or drink anything for the last several days due to nausea and vomiting.  She reports weight loss of 10-12 lbs lbs over the last month.  Has intermittent small amounts of bright red blood on the toilet paper.  She has also noticed mucus and loose stools over the past 2 days.  Reports a brother with colon cancer (dx at age 59).  No other known family history of gastrointestinal malignancy.  CT 05/05/20: Interval progression of the intrahepatic biliary ductal dilatation since the study of 2018. 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 Further evaluation with MRI/MRCP is recommended.  Patient had an EUS in 06/2016 for CBD dilation and pancreatic duct dilation: Prominent ampulla, but no evidence of ampullary mass. There was dilation in the common bile duct which measured up to 8 mm. There was no sign of significant pathology in the pancreatic head.  Evidence of a cholecystectomy. There was no evidence of significant pathology in the left lobe of the liver.  Colonoscopy 05/2019: Random biopsies for surveillance, negative for dysplasia.  No polyps.  Past Medical History:  Diagnosis Date  . Anxiety   . Arthritis   .  Chronic abdominal pain   . Chronic lower back pain   . Chronic nausea   . Depression   . Diabetes mellitus without complication ([place])   . Diverticulosis   . Fibromyalgia   . Frequency of urination   . GERD (gastroesophageal reflux disease)   . Headache   . Hematuria   . Hepatitis C antibody positive in blood    per pt told by pcp  . History of panic attacks   . History of syncope    hx recurrent syncope --- per epic domentation non-cardiac , orthostatic hypotension, anxiety, dehydration  . History of uterine fibroid   . HTN (hypertension), benign   . Hyperlipidemia 01/03/2012  . IBS (irritable bowel syndrome)   . Myalgia   . Seizure disorder ([place]) followed by pcp until new neurologist appr. in jan 2019   "started having them in my 20's" ,  petit mal ----  per pt last seizure one 2015  . SLE (systemic lupus erythematosus) [facility])    rheumatologist-- dr Gerilyn Nestle (consult 11-12-2016) having work-up done  . Ulcerative colitis    followed by dr Penelope Coop at [place]  . Wears glasses     Past Surgical History:  Procedure Laterality Date  . ABDOMINAL HYSTERECTOMY  1988  . CHOLECYSTECTOMY OPEN  1978  . COLONOSCOPY N/A 05/31/2013   Procedure: COLONOSCOPY;  Surgeon: Wonda Horner, MD;  Location: [facility] ENDOSCOPY;  Service: Endoscopy;  Laterality: N/A;  . CYSTOSCOPY/RETROGRADE/URETEROSCOPY Bilateral 07/23/2017   Procedure: CYSTOSCOPY/RETROGRADE/URETEROSCOPY;  Surgeon: Ceasar Mons, MD;  Location: [facility];  Service: Urology;  Laterality:  Bilateral;  . ESOPHAGOGASTRODUODENOSCOPY (EGD) WITH PROPOFOL N/A 05/11/2014   Procedure: ESOPHAGOGASTRODUODENOSCOPY (EGD) WITH PROPOFOL;  Surgeon: Lear Ng, MD;  Location: [facility] ENDOSCOPY;  Service: Endoscopy;  Laterality: N/A;  egd first  . EUS N/A 06/21/2016   Procedure: ESOPHAGEAL ENDOSCOPIC ULTRASOUND (EUS) RADIAL;  Surgeon: Arta Silence, MD;  Location: [facility] ENDOSCOPY;  Service: Endoscopy;  Laterality: N/A;  . FLEXIBLE  SIGMOIDOSCOPY N/A 05/11/2014   Procedure: FLEXIBLE SIGMOIDOSCOPY;  Surgeon: Lear Ng, MD;  Location: [facility] ENDOSCOPY;  Service: Endoscopy;  Laterality: N/A;  . LAPAROTOMY W/ BILATERAL SALPINGOOPHORECTOMY  09-26-2003   dr Matthew Saras at [facility]  . LAPROSCOPY W/ LYSIS ADHESIONS  06-08-2003   dr Radene Knee [facility]  . TRANSTHORACIC ECHOCARDIOGRAM  04/29/2016   ef 60-65%,  grade 2 diastolic dysfunction/  trivial MR and TR  . TUBAL LIGATION Bilateral yrs ago    Prior to Admission medications   Medication Sig Start Date End Date Taking? Authorizing Provider  ALPRAZolam Duanne Moron) 1 MG tablet Take 1 mg by mouth 3 (three) times daily as needed for anxiety.    Yes [provider]  cyclobenzaprine (FLEXERIL) 10 MG tablet Take 10 mg by mouth as needed for muscle spasms. 03/30/20  Yes [provider]  diazepam (VALIUM) 5 MG tablet Take 5 mg by mouth 2 (two) times daily as needed for anxiety.  05/14/19  Yes [provider]  esomeprazole (NEXIUM) 40 MG capsule Take 40 mg by mouth daily before breakfast. 06/10/19  Yes [provider]  FLUoxetine (PROZAC) 20 MG capsule Take 20 mg by mouth daily.    Yes [provider]  Multiple Vitamins-Minerals (WOMENS MULTI PO) Take 1 tablet by mouth daily.   Yes [provider]  Oxycodone HCl 10 MG TABS Take 10 mg by mouth 3 (three) times daily as needed for pain. 05/21/17  Yes [provider]  Polyethyl Glycol-Propyl Glycol (SYSTANE OP) Place 1 drop into both eyes 3 (three) times daily as needed (dry eyes.).   Yes [provider]  triamterene-hydrochlorothiazide (MAXZIDE) 75-50 MG tablet Take 1 tablet by mouth every morning. 03/30/20  Yes [provider]    Current Facility-Administered Medications  Medication Dose Route Frequency Provider Last Rate Last Admin  . sodium chloride flush (NS) 0.9 % injection 3 mL  3 mL Intravenous Once Molpus, John, MD        Allergies as of 05/04/2020 - Review Complete  05/04/2020  Allergen Reaction Noted  . Latex Other (See Comments) 06/20/2016  . Nsaids Other (See Comments) 06/29/2016  . Penicillins Hives 05/07/2011  . Wellbutrin [bupropion hcl] Other (See Comments) 05/07/2011  . Aspirin Nausea Only 05/07/2011  . Darvocet [propoxyphene n-acetaminophen] Nausea And Vomiting 05/07/2011  . Isometheptene-dichloral-apap Nausea Only 05/07/2011  . Tape Hives 11/09/2013  . Toradol [ketorolac tromethamine] Nausea And Vomiting 03/19/2016  . Tylenol [acetaminophen] Nausea Only 10/22/2014  . Milk-related compounds  08/01/2017  . Other  08/01/2017    Family History  Problem Relation Age of Onset  . Alzheimer's disease Mother   . Hypertension Mother   . Colon cancer Brother   . Hypertension Brother   . Other Father        ruptured appendix  . Breast cancer Sister   . Hypertension Sister   . Hypertension Sister   . Memory loss Brother   . Hypertension Sister   . Heart disease Sister     Social History   Socioeconomic History  . Marital status: Married    Spouse name: Not on  file  . Number of children: 1  . Years of education: 2 years college  . Highest education level: Not on file  Occupational History  . Occupation: Retired  Tobacco Use  . Smoking status: Never Smoker  . Smokeless tobacco: Never Used  Vaping Use  . Vaping Use: Never used  Substance and Sexual Activity  . Alcohol use: Yes    Comment: occ  . Drug use: No  . Sexual activity: Not on file  Other Topics Concern  . Not on file  Social History Narrative   Lives at home with husband.   Right-handed.   No caffeine use.   Social Determinants of Health   Financial Resource Strain:   . Difficulty of Paying Living Expenses:   Food Insecurity:   . Worried About Charity fundraiser in the Last Year:   . Arboriculturist in the Last Year:   Transportation Needs:   . Film/video editor (Medical):   Marland Kitchen Lack of Transportation (Non-Medical):   Physical Activity:   . Days of  Exercise per Week:   . Minutes of Exercise per Session:   Stress:   . Feeling of Stress :   Social Connections:   . Frequency of Communication with Friends and Family:   . Frequency of Social Gatherings with Friends and Family:   . Attends Religious Services:   . Active Member of Clubs or Organizations:   . Attends Archivist Meetings:   Marland Kitchen Marital Status:   Intimate Partner Violence:   . Fear of Current or Ex-Partner:   . Emotionally Abused:   Marland Kitchen Physically Abused:   . Sexually Abused:     Review of Systems: Review of Systems  Constitutional: Positive for malaise/fatigue and weight loss. Negative for chills and fever.  HENT: Negative for hearing loss and tinnitus.   Eyes: Negative for pain and redness.  Respiratory: Negative for cough and shortness of breath.   Cardiovascular: Negative for chest pain and palpitations.  Gastrointestinal: Positive for abdominal pain, diarrhea, nausea and vomiting. Negative for blood in stool, constipation, heartburn and melena.  Genitourinary: Positive for hematuria. Negative for flank pain.  Musculoskeletal: Positive for back pain. Negative for falls and joint pain.  Skin: Negative for itching and rash.  Neurological: Negative for seizures and loss of consciousness.  Endo/Heme/Allergies: Negative for polydipsia. Does not bruise/bleed easily.  Psychiatric/Behavioral: Negative for memory loss and substance abuse.   Physical Exam: Vital signs in last 24 hours: Temp:  [97.6 F (36.4 C)-98.7 F (37.1 C)] 97.6 F (36.4 C) (07/16 1534) Pulse Rate:  [90-118] 95 (07/16 1534) Resp:  [14-20] 15 (07/16 1534) BP: (118-151)/(68-86) 126/86 (07/16 1534) SpO2:  [93 %-100 %] 99 % (07/16 1534) Weight:  [95.3 kg-99.8 kg] 99.8 kg (07/16 1534)   General:  Lethargic,  Well-developed, well-nourished, pleasant and cooperative in NAD Head: Normocephalic and atraumatic. Eyes: Sclera clear, no icterus.   Conjunctiva pink. Mouth:  No ulcerations or lesions.   Oropharynx pink & moist. Neck:  No masses or thyromegaly. Lungs: Clear throughout to auscultation.   No wheezes, crackles, or rhonchi. No evident respiratory distress. Heart:  Regular rate and rhythm; no murmurs, clicks, rubs,  or gallops. Abdomen: Soft, moderate RUQ tenderness with guarding, nontympanitic, and nondistended. No masses, hepatosplenomegaly or ventral hernias noted. Normal bowel sounds, without bruits. Msk: Symmetrical without gross deformities. Pulses:Normal pulses noted. Extremities:  Without clubbing, cyanosis, or edema. Neurologic: Alert and coherent;  grossly normal neurologically. Skin: Intact without significant  lesions or rashes. Cervical Nodes: No significant cervical adenopathy. Psych:  Cooperative. Normal mood and affect.  Intake/Output from previous day: No intake/output data recorded. Intake/Output this shift: No intake/output data recorded.  Lab Results: Recent Labs    05/04/20 2349  WBC 5.7  HGB 14.4  HCT 45.5  PLT 345   BMET Recent Labs    05/04/20 2349  NA 137  K 3.3*  CL 97*  CO2 26  GLUCOSE 113*  BUN 13  CREATININE 0.70  CALCIUM 9.4   LFT Recent Labs    05/04/20 2349  PROT 8.8*  ALBUMIN 4.1  AST 18  ALT 17  ALKPHOS 73  BILITOT 0.6   PT/INR No results for input(s): LABPROT, INR in the last 72 hours.  Studies/Results: CT ABDOMEN PELVIS W CONTRAST  Result Date: 05/05/2020 CLINICAL DATA:  64 year old female with right-sided abdominal pain. EXAM: CT ABDOMEN AND PELVIS WITH CONTRAST TECHNIQUE: Multidetector CT imaging of the abdomen and pelvis was performed using the standard protocol following bolus administration of intravenous contrast. CONTRAST:  160mL OMNIPAQUE IOHEXOL 300 MG/ML  SOLN COMPARISON:  CT abdomen pelvis dated 04/09/2020. FINDINGS: Lower chest: The visualized lung bases are clear. No intra-abdominal free air or free fluid. Hepatobiliary: Apparent mild fatty infiltration of the liver. There is intrahepatic biliary  ductal dilatation progressed since the study of 2018. Cholecystectomy. No retained calcified stone noted in the central CBD. Pancreas: There is dilatation of the main pancreatic duct measuring up to approximately 6 mm. There is apparent 15 x 17 mm soft tissue fullness in the region of the ampulla (24/2 and 44/5). Further evaluation with MRI/MRCP is recommended to exclude an ampullary mass. There is mild distal pancreatic atrophy. Spleen: Normal in size without focal abnormality. Adrenals/Urinary Tract: The right adrenal gland is unremarkable. There is a 1 cm fatty left adrenal nodule, likely an adenoma. The kidneys appear unremarkable. There is no hydronephrosis on either side. There is symmetric enhancement and excretion of contrast by both kidneys. The visualized ureters and urinary bladder appear unremarkable. Stomach/Bowel: There is sigmoid diverticulosis without active inflammatory changes. There is loose stool throughout the colon compatible with diarrheal state. Correlation with clinical exam and stool cultures recommended. There is no bowel obstruction. The appendix is normal. Vascular/Lymphatic: The abdominal aorta and IVC are unremarkable. No portal venous gas. There is no adenopathy. Reproductive: Hysterectomy. No adnexal masses. Other: None Musculoskeletal: Degenerative changes of the spine. No acute osseous pathology. IMPRESSION: 1. Interval progression of the intrahepatic biliary ductal dilatation since the study of 2018. 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 Further evaluation with MRI/MRCP is recommended. 2. Diarrheal state. Correlation with clinical exam and stool cultures recommended. No bowel obstruction. Normal appendix. 3. Colonic diverticulosis. 4. A 1 cm fatty left adrenal nodule, likely an adenoma. Electronically Signed   By: Anner Crete M.D.   On: 05/05/2020 01:42    Impression: RUQ Abdominal pain, abnormal  CT -CT 05/05/20: Interval progression of the intrahepatic biliary ductal dilatation since the study of 2018. 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 Further evaluation with MRI/MRCP is recommended. -As of 05/04/20: LFTs remain within normal limits: T. Bili 0.6/ AST 18/ ALT 17/ ALP 73 with normal lipase of 22.  No leukocytosis (WBCs 5.7). -Remote cholecystectomy (1978) -Patient had an EUS in 06/2016 with prominent ampulla, but no evidence of ampullary mass and no sign of significant pathology  in the pancreatic head.  Weight loss of 10-12 lbs in 1 month  Remote history of left-sided ulcerative colitis, seemingly in remission (patient if off medication)  Family history of colon cancer (brother, age 85); minimal BRBPR, no anemia, up to date on colonoscopy (suspect hemorrhoidal)  Plan: MRCP to further evaluate abnormal CT as noted above.  Plan for EUS on Monday pending MRCP findings.  Repeat CBC, CMP, lipase tomorrow.    Ordered Ca 19-9 for tomorrow.  Anti-emetics and pain control PRN.  [facility] will follow.   LOS: 0 days   Salley Slaughter  05/05/2020, 5:11 PM [facility] [phone]

## 2020-05-06 DIAGNOSIS — G8929 Other chronic pain: Secondary | ICD-10-CM

## 2020-05-06 DIAGNOSIS — R109 Unspecified abdominal pain: Secondary | ICD-10-CM

## 2020-05-06 LAB — CBC WITH DIFFERENTIAL/PLATELET
Abs Immature Granulocytes: 0.01 10*3/uL (ref 0.00–0.07)
Basophils Absolute: 0 10*3/uL (ref 0.0–0.1)
Basophils Relative: 0 %
Eosinophils Absolute: 0 10*3/uL (ref 0.0–0.5)
Eosinophils Relative: 1 %
HCT: 42.7 % (ref 36.0–46.0)
Hemoglobin: 13.1 g/dL (ref 12.0–15.0)
Immature Granulocytes: 0 %
Lymphocytes Relative: 58 %
Lymphs Abs: 2.6 10*3/uL (ref 0.7–4.0)
MCH: 25.6 pg — ABNORMAL LOW (ref 26.0–34.0)
MCHC: 30.7 g/dL (ref 30.0–36.0)
MCV: 83.6 fL (ref 80.0–100.0)
Monocytes Absolute: 0.5 10*3/uL (ref 0.1–1.0)
Monocytes Relative: 10 %
Neutro Abs: 1.4 10*3/uL — ABNORMAL LOW (ref 1.7–7.7)
Neutrophils Relative %: 31 %
Platelets: 317 10*3/uL (ref 150–400)
RBC: 5.11 MIL/uL (ref 3.87–5.11)
RDW: 14.8 % (ref 11.5–15.5)
WBC: 4.5 10*3/uL (ref 4.0–10.5)
nRBC: 0 % (ref 0.0–0.2)

## 2020-05-06 LAB — GLUCOSE, CAPILLARY
Glucose-Capillary: 98 mg/dL (ref 70–99)
Glucose-Capillary: 96 mg/dL (ref 70–99)
Glucose-Capillary: 61 mg/dL — ABNORMAL LOW (ref 70–99)
Glucose-Capillary: 51 mg/dL — ABNORMAL LOW (ref 70–99)
Glucose-Capillary: 83 mg/dL (ref 70–99)

## 2020-05-06 LAB — COMPREHENSIVE METABOLIC PANEL
ALT: 18 U/L (ref 0–44)
AST: 18 U/L (ref 15–41)
Albumin: 3.3 g/dL — ABNORMAL LOW (ref 3.5–5.0)
Alkaline Phosphatase: 58 U/L (ref 38–126)
Anion gap: 10 (ref 5–15)
BUN: 13 mg/dL (ref 8–23)
CO2: 24 mmol/L (ref 22–32)
Calcium: 8.5 mg/dL — ABNORMAL LOW (ref 8.9–10.3)
Chloride: 101 mmol/L (ref 98–111)
Creatinine, Ser: 0.58 mg/dL (ref 0.44–1.00)
GFR calc Af Amer: >60 mL/min (ref >60)
GFR calc non Af Amer: >60 mL/min (ref >60)
Glucose, Bld: 98 mg/dL (ref 70–99)
Potassium: 3.9 mmol/L (ref 3.5–5.1)
Sodium: 135 mmol/L (ref 135–145)
Total Bilirubin: 0.7 mg/dL (ref 0.3–1.2)
Total Protein: 7 g/dL (ref 6.5–8.1)

## 2020-05-06 LAB — LIPASE, BLOOD: Lipase: 20 U/L (ref 11–51)

## 2020-05-06 MED ORDER — OXYCODONE HCL 5 MG PO TABS
10.0000 mg | ORAL_TABLET | ORAL | Status: DC | PRN
  Administered 2020-05-06 – 2020-05-09 (×10): 10 mg via ORAL
  Filled 2020-05-06 (×10): qty 2

## 2020-05-06 MED ORDER — BOOST / RESOURCE BREEZE PO LIQD CUSTOM
1.0000 | Freq: Two times a day (BID) | ORAL | Status: DC
  Administered 2020-05-07 – 2020-05-08 (×2): 1 via ORAL

## 2020-05-06 MED ORDER — HYDROMORPHONE HCL 1 MG/ML IJ SOLN
0.5000 mg | INTRAMUSCULAR | Status: DC | PRN
  Administered 2020-05-06 – 2020-05-08 (×8): 0.5 mg via INTRAVENOUS
  Filled 2020-05-06 (×8): qty 0.5

## 2020-05-06 MED ORDER — OXYCODONE HCL 5 MG PO TABS
5.0000 mg | ORAL_TABLET | ORAL | Status: DC | PRN
  Administered 2020-05-06: 5 mg via ORAL
  Filled 2020-05-06: qty 1

## 2020-05-06 MED ORDER — FLUOXETINE HCL 20 MG PO CAPS
20.0000 mg | ORAL_CAPSULE | Freq: Every day | ORAL | Status: DC
  Administered 2020-05-06 – 2020-05-09 (×4): 20 mg via ORAL
  Filled 2020-05-06 (×4): qty 1

## 2020-05-06 MED ORDER — ONDANSETRON HCL 4 MG/2ML IJ SOLN
4.0000 mg | Freq: Four times a day (QID) | INTRAMUSCULAR | Status: DC | PRN
  Administered 2020-05-07: 4 mg via INTRAVENOUS
  Filled 2020-05-06: qty 2

## 2020-05-06 MED ORDER — DEXTROSE-NACL 5-0.45 % IV SOLN
INTRAVENOUS | Status: DC
  Administered 2020-05-08: 1000 mL via INTRAVENOUS

## 2020-05-06 MED ORDER — DIAZEPAM 5 MG PO TABS
5.0000 mg | ORAL_TABLET | Freq: Two times a day (BID) | ORAL | Status: DC | PRN
  Administered 2020-05-07: 5 mg via ORAL
  Filled 2020-05-06: qty 1

## 2020-05-06 MED ORDER — MORPHINE SULFATE (PF) 2 MG/ML IV SOLN: 2.0000 mg | INTRAVENOUS | Status: DC | PRN

## 2020-05-06 MED ORDER — SENNOSIDES-DOCUSATE SODIUM 8.6-50 MG PO TABS: 1.0000 | ORAL_TABLET | Freq: Two times a day (BID) | ORAL | Status: DC | PRN

## 2020-05-06 MED ORDER — CYCLOBENZAPRINE HCL 10 MG PO TABS: 10.0000 mg | ORAL_TABLET | ORAL | Status: DC | PRN

## 2020-05-06 MED ORDER — TRAZODONE HCL 50 MG PO TABS
50.0000 mg | ORAL_TABLET | Freq: Once | ORAL | Status: AC
  Administered 2020-05-06: 50 mg via ORAL
  Filled 2020-05-06: qty 1

## 2020-05-06 MED ORDER — ADULT MULTIVITAMIN W/MINERALS CH
1.0000 | ORAL_TABLET | Freq: Every day | ORAL | Status: DC
  Administered 2020-05-06 – 2020-05-09 (×4): 1 via ORAL
  Filled 2020-05-06 (×4): qty 1

## 2020-05-06 NOTE — Progress Notes (Signed)
Hypoglycemic Event  CBG: 61 at 1719   Treatment: 8 ounces of orange juice  Symptoms: N/A  Follow-up CBG: Time:1741 CBG Result:51  Possible Reasons for Event: unknown   Comments/MD notified: Posey Pronto, MD    Lucy Chris

## 2020-05-06 NOTE — Progress Notes (Signed)
Initial Nutrition Assessment  DOCUMENTATION CODES:   Obesity unspecified  INTERVENTION:  Boost Breeze po BID, each supplement provides 250 kcal and 9 grams of protein  MVI with minerals daily  NUTRITION DIAGNOSIS:   Inadequate oral intake related to nausea, vomiting, poor appetite as evidenced by per patient/family report.  GOAL:   Patient will meet greater than or equal to 90% of their needs  MONITOR:   PO intake, Supplement acceptance, Labs, Diet advancement, Weight trends  REASON FOR ASSESSMENT:   Malnutrition Screening Tool    ASSESSMENT:  RD working remotely.  64 year old female admitted for chronic abdominal pain with past medical history significant of IBS, UC s/p cholecystectomy, DM2 presented with complaints of worsening abdominal pain with nausea and vomiting over the past 2-3 days.  Able to speak with patient via phone this afternoon, says she is hanging in there. She recalls chx breast and collards and tolerated well. Patient reports recently making healthy lifestyle changes for management of DM2, Patient reports typically drinks a protein shake or boiled egg for breakfast recalls grilled chx/stk salad, chx wraps, fruit, and lots of vegetables. Patient reports unable to tolerate any po intake due to episodes of nausea with vomiting. Patient drinks Boost supplements, she does not like Ensure. Will provide Boost Breeze daily to aid with meeting needs.   UBW 232-235 lb per pt Per notes, patient reports weight loss of 10-12 lbs in the last month. Per chart weights have acutally increased 9.9 lbs in the past month. Currently pt weighs 219.56 lb and on 04/08/20 she weighed 95.3 kg (209.66 lb).  Patient weighed 104.3 kg (229.46 lb) on 07/13/19, pt weighed 102.6 kg (225.72 lb) on 04/20/19. This indicates ~10 lb (4.3%) wt loss over the past 10 months which is insignificant.  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 Last EUS completed 5 years ago. Plans for updated EUS on Monday per GI.  Medications reviewed and include: SSI, Protonix IVF: NaCl @ 75 ml/hr Labs: CBGs 78,46,962 Lab Results  Component Value Date   HGBA1C 6.6 (H) 05/05/2020     NUTRITION - FOCUSED PHYSICAL EXAM: Unable to complete at this time, RD working remotely.  Diet Order:   Diet Order            DIET SOFT Room service appropriate? Yes; Fluid consistency: Thin  Diet effective now                 EDUCATION NEEDS:   Education needs have been addressed  Skin:  Skin Assessment: Reviewed RN Assessment  Last BM:  7/15  Height:   Ht Readings from Last 1 Encounters:  05/05/20 5\' 5"  (1.651 m)    Weight:   Wt Readings from Last 1 Encounters:  05/05/20 99.8 kg    Ideal Body Weight:  56.8 kg  BMI:  Body mass index is 36.61 kg/m.  Estimated Nutritional Needs:   Kcal:  2000-2250  Protein:  100-112  Fluid:  >/= 2 L/day   Lajuan Lines, RD, LDN Clinical Nutrition After Hours/Weekend Pager # in [place]

## 2020-05-06 NOTE — Progress Notes (Addendum)
[facility] Progress Note  Patient: Erin Wolf    OIZ:[id number]  DOA: 05/04/2020     Date of Service: the patient was seen and examined on 05/06/2020  Brief hospital course: Past medical history of irritable bowel syndrome, ulcerative colitis, cholecystectomy, type II DM, bilateral arthritis of the knee and chronic pain syndrome as well as chronic anxiety.  Patient presented with worsening abdominal pain.  Work-up showed dilated CBD and intrahepatic biliary ductal dilatation which is also evident on MRCP with concern for ampullary prominence.  [facility] is consulted. Currently plan is pain control, monitor for EUS which is scheduled on Monday.  Assessment and Plan: 1.  Abdominal pain. Intrahepatic and extremity biliary ductal dilatation with CBD dilatation. Ampullary prominence Weight loss Continue home pain medication. [facility] consulted. EUS scheduled for Monday. LFTs currently stable. 10 pound weight loss in last 3 months.  2.  Chronic pain syndrome. Patient's primary complaint right now is pain control. She reports pain control in her abdomen as well as pain control pain in both her knees is primary issue. Patient is on 10 mg of OxyIR at home every 4 hours as needed which I have received. Patient tells me that that gave her medication that starts with D in urgent care that helped her the most.  And requesting to resume that medication. Patient also has bilateral arthritis for which she gets knee injection and last 1 was performed in February.  We will consult with orthopedics to see if they would like to perform intraarticular joint in hospital patient.  3.  Essential hypertension Blood pressure stable. Continue to monitor.  4.  Hyperlipidemia Currently stable.  Monitor.  5.  History of ulcerative colitis Management per GI.  6.  Anxiety and depression Patient reports that she is feeling depressed because her pain is not well controlled. Resume home  medication.  7.  Obesity Body mass index is 36.61 kg/m.  Nutrition Problem: Inadequate oral intake Etiology: nausea, vomiting, poor appetite Interventions: Interventions: Boost Breeze, MVI   8.  Type 2 diabetes mellitus with hyperglycemia, uncontrolled. Currently appears to be hypoglycemia from poor p.o. intake from abdominal pain. We will start her on D5 half-normal saline for now. Continue with CBG monitoring as well as sliding scale indicates patient should stable 140. If the patient consistently stays above 180 discontinue D5.  Diet: Soft diet per GI appreciate assistance DVT Prophylaxis: Subcutaneous Heparin   enoxaparin (LOVENOX) injection 40 mg Start: 05/05/20 2200    Advance goals of care discussion: Full code  Family Communication: no family was present at bedside, at the time of interview.   Disposition:  Status is: Inpatient  Remains inpatient appropriate because:Ongoing diagnostic testing needed not appropriate for outpatient work up   Dispo: The patient is from: Home              Anticipated d/c is to: Home              Anticipated d/c date is: 3 days              Patient currently is not medically stable to d/c.  Subjective: Reports severe abdominal pain.  Also reports bilateral knee pain.  She is upset that she is not receiving home medication.  Reports nausea no vomiting.  Physical Exam:  General: Appear in moderate distress, no Rash; Oral Mucosa Clear, moist. no Abnormal Neck Mass Or lumps, Conjunctiva normal  Cardiovascular: S1 and S2 Present, no Murmur, Respiratory: good respiratory effort, Bilateral Air  entry present and Clear to Auscultation, no Crackles, no wheezes Abdomen: Bowel Sound present, Soft and diffuse tenderness Extremities: no Pedal edema, no calf tenderness Neurology: alert and oriented to time, place, and person affect tearful. no new focal deficit Gait not checked due to patient safety concerns  Vitals:   05/05/20 1933 05/05/20  2335 05/06/20 0335 05/06/20 1347  BP: (!) 143/123 (!) 136/101 (!) 132/98 118/80  Pulse: (!) 105 98 94 96  Resp: 16 14 14 15   Temp: 97.9 F (36.6 C) 98.2 F (36.8 C) 97.9 F (36.6 C) 98 F (36.7 C)  TempSrc: Oral Oral Oral Oral  SpO2: 96% 96% 96% 97%  Weight:      Height:        Intake/Output Summary (Last 24 hours) at 05/06/2020 1833 Last data filed at 05/06/2020 1700 Gross per 24 hour  Intake 1801.25 ml  Output 600 ml  Net 1201.25 ml   Filed Weights   05/04/20 2131 05/05/20 1534  Weight: 95.3 kg 99.8 kg    Data Reviewed: I have personally reviewed and interpreted daily labs, tele strips, imagings as discussed above. I reviewed all nursing notes, pharmacy notes, vitals, pertinent old records I have discussed plan of care as described above with RN and patient/family.  CBC: Recent Labs  Lab 05/04/20 2349 05/05/20 1931 05/06/20 0621  WBC 5.7 4.6 4.5  NEUTROABS  --   --  1.4*  HGB 14.4 15.0 13.1  HCT 45.5 48.2* 42.7  MCV 80.7 84.0 83.6  PLT 345 355 443   Basic Metabolic Panel: Recent Labs  Lab 05/04/20 2349 05/05/20 1931 05/06/20 0621  NA 137  --  135  K 3.3*  --  3.9  CL 97*  --  101  CO2 26  --  24  GLUCOSE 113*  --  98  BUN 13  --  13  CREATININE 0.70 0.73 0.58  CALCIUM 9.4  --  8.5*    Studies: MR 3D Recon At Scanner  Result Date: 05/05/2020 CLINICAL DATA:  Right-sided abdominal pain, biliary dilation, possible ampullary mass EXAM: MRI ABDOMEN WITHOUT AND WITH CONTRAST (INCLUDING MRCP) TECHNIQUE: Multiplanar multisequence MR imaging of the abdomen was performed both before and after the administration of intravenous contrast. Heavily T2-weighted images of the biliary and pancreatic ducts were obtained, and three-dimensional MRCP images were rendered by post processing. CONTRAST:  43mL GADAVIST GADOBUTROL 1 MMOL/ML IV SOLN COMPARISON:  05/05/2020, 04/09/2020 FINDINGS: Lower chest: No acute findings. Hepatobiliary: Patient is status post cholecystectomy.  Mild intrahepatic biliary duct dilation is noted. Common bile duct measures up to 8 mm in diameter, with no evidence of choledocholithiasis. There is smooth tapering of the common bile duct at the ampulla. The soft tissue fullness in the region of the ampulla seen on recent CT is again appreciated on MRI measuring up to 1.2 cm in size. This does demonstrate enhancement in this area on postcontrast imaging, though peristalsis and respiratory motion does somewhat limit the evaluation. ERCP is recommended for direct visualization. The remainder of the liver is unremarkable. No focal parenchymal abnormalities. Pancreas: There is pancreatic duct dilation measuring up to 6 mm in the region of the head and body. No filling defects. No evidence of pancreatic divisum. Pancreatic parenchyma enhances normally. Spleen:  Within normal limits in size and appearance. 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 Otherwise the kidneys and adrenals are normal. Stomach/Bowel: Visualized portions within the abdomen are unremarkable. Vascular/Lymphatic: No pathologically enlarged lymph nodes identified. No abdominal aortic  aneurysm demonstrated. Other:  None. Musculoskeletal: No suspicious bone lesions identified. IMPRESSION: 1. Intra and extrahepatic biliary duct dilation, as well as dilatation of the pancreatic duct, unchanged since CT. 2. Soft tissue prominence in the region of the ampulla, with faint enhancement on postcontrast imaging. Ampullary mass is suspected, though evaluation is limited by respiratory motion. ERCP is recommended for direct visualization. 3. Left adrenal nodule containing macroscopic fat, favor adrenal myelolipoma. Electronically Signed   By: Randa Ngo M.D.   On: 05/05/2020 19:25   MR ABDOMEN MRCP W WO CONTAST  Result Date: 05/05/2020 CLINICAL DATA:  Right-sided abdominal pain, biliary dilation, possible ampullary mass EXAM: MRI ABDOMEN WITHOUT AND WITH CONTRAST (INCLUDING  MRCP) TECHNIQUE: Multiplanar multisequence MR imaging of the abdomen was performed both before and after the administration of intravenous contrast. Heavily T2-weighted images of the biliary and pancreatic ducts were obtained, and three-dimensional MRCP images were rendered by post processing. CONTRAST:  24mL GADAVIST GADOBUTROL 1 MMOL/ML IV SOLN COMPARISON:  05/05/2020, 04/09/2020 FINDINGS: Lower chest: No acute findings. Hepatobiliary: Patient is status post cholecystectomy. Mild intrahepatic biliary duct dilation is noted. Common bile duct measures up to 8 mm in diameter, with no evidence of choledocholithiasis. There is smooth tapering of the common bile duct at the ampulla. The soft tissue fullness in the region of the ampulla seen on recent CT is again appreciated on MRI measuring up to 1.2 cm in size. This does demonstrate enhancement in this area on postcontrast imaging, though peristalsis and respiratory motion does somewhat limit the evaluation. ERCP is recommended for direct visualization. The remainder of the liver is unremarkable. No focal parenchymal abnormalities. Pancreas: There is pancreatic duct dilation measuring up to 6 mm in the region of the head and body. No filling defects. No evidence of pancreatic divisum. Pancreatic parenchyma enhances normally. Spleen:  Within normal limits in size and appearance. 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 Otherwise the kidneys and adrenals are normal. Stomach/Bowel: Visualized portions within the abdomen are unremarkable. Vascular/Lymphatic: No pathologically enlarged lymph nodes identified. No abdominal aortic aneurysm demonstrated. Other:  None. Musculoskeletal: No suspicious bone lesions identified. IMPRESSION: 1. Intra and extrahepatic biliary duct dilation, as well as dilatation of the pancreatic duct, unchanged since CT. 2. Soft tissue prominence in the region of the ampulla, with faint enhancement on postcontrast  imaging. Ampullary mass is suspected, though evaluation is limited by respiratory motion. ERCP is recommended for direct visualization. 3. Left adrenal nodule containing macroscopic fat, favor adrenal myelolipoma. Electronically Signed   By: Randa Ngo M.D.   On: 05/05/2020 19:25    Scheduled Meds: . enoxaparin (LOVENOX) injection  40 mg Subcutaneous Q24H  . [START ON 05/07/2020] feeding supplement  1 Container Oral BID BM  . FLUoxetine  20 mg Oral Daily  . insulin aspart  0-5 Units Subcutaneous QHS  . insulin aspart  0-9 Units Subcutaneous TID WC  . multivitamin with minerals  1 tablet Oral Daily  . pantoprazole  40 mg Oral Daily  . sodium chloride flush  3 mL Intravenous Once   Continuous Infusions: . dextrose 5 % and 0.45% NaCl 75 mL/hr at 05/06/20 1813   PRN Meds: acetaminophen **OR** acetaminophen, cyclobenzaprine, diazepam, HYDROmorphone (DILAUDID) injection, hyoscyamine, oxyCODONE, senna-docusate  Time spent: 35 minutes  Author: Berle Mull, MD [facility] 05/06/2020 6:33 PM  To reach On-call, see care teams to locate the attending and reach out via [email or web address]. Between 7PM-7AM, please contact night-coverage If you still have difficulty reaching the attending provider,  please page the [facility] (Director on Call) for [facility] on amion for assistance.

## 2020-05-06 NOTE — Progress Notes (Addendum)
Patient is sitting up in bedside chair and actually fairly good spirits, talking with her mom on the telephone, but denying any significant improvement in the way she feels.  LFTs remain normal.  CA 19-9 level pending.   MRCP shows ampullary prominence, which is of uncertain significance but with her pain, weight loss, and progressive biliary dilatation, there is concern of the interim development of an ampullary tumor since her EUS was done 5 years ago.   For updated endoscopic ultrasound on Monday (time TBA).  In the meantime, have updated to a soft diet.  Cleotis Nipper, M.D. Pager [phone] If no answer or after 5 PM call [phone]

## 2020-05-07 LAB — CBC
HCT: 49.1 % — ABNORMAL HIGH (ref 36.0–46.0)
Hemoglobin: 14.7 g/dL (ref 12.0–15.0)
MCH: 25.4 pg — ABNORMAL LOW (ref 26.0–34.0)
MCHC: 29.9 g/dL — ABNORMAL LOW (ref 30.0–36.0)
MCV: 84.8 fL (ref 80.0–100.0)
Platelets: 200 10*3/uL (ref 150–400)
RBC: 5.79 MIL/uL — ABNORMAL HIGH (ref 3.87–5.11)
RDW: 15.2 % (ref 11.5–15.5)
WBC: 4.5 10*3/uL (ref 4.0–10.5)
nRBC: 0 % (ref 0.0–0.2)

## 2020-05-07 LAB — COMPREHENSIVE METABOLIC PANEL
ALT: 15 U/L (ref 0–44)
AST: 15 U/L (ref 15–41)
Albumin: 3.2 g/dL — ABNORMAL LOW (ref 3.5–5.0)
Alkaline Phosphatase: 56 U/L (ref 38–126)
Anion gap: 6 (ref 5–15)
BUN: 9 mg/dL (ref 8–23)
CO2: 27 mmol/L (ref 22–32)
Calcium: 8.3 mg/dL — ABNORMAL LOW (ref 8.9–10.3)
Chloride: 105 mmol/L (ref 98–111)
Creatinine, Ser: 0.65 mg/dL (ref 0.44–1.00)
GFR calc Af Amer: >60 mL/min (ref >60)
GFR calc non Af Amer: >60 mL/min (ref >60)
Glucose, Bld: 106 mg/dL — ABNORMAL HIGH (ref 70–99)
Potassium: 3.8 mmol/L (ref 3.5–5.1)
Sodium: 138 mmol/L (ref 135–145)
Total Bilirubin: 0.3 mg/dL (ref 0.3–1.2)
Total Protein: 6.7 g/dL (ref 6.5–8.1)

## 2020-05-07 LAB — CANCER ANTIGEN 19-9: CA 19-9: 16 U/mL (ref 0–35)

## 2020-05-07 LAB — GLUCOSE, CAPILLARY
Glucose-Capillary: 115 mg/dL — ABNORMAL HIGH (ref 70–99)
Glucose-Capillary: 113 mg/dL — ABNORMAL HIGH (ref 70–99)
Glucose-Capillary: 87 mg/dL (ref 70–99)
Glucose-Capillary: 105 mg/dL — ABNORMAL HIGH (ref 70–99)

## 2020-05-07 NOTE — Progress Notes (Signed)
The patient is stable.  She is tolerating a soft diet.  She states that her anxiety is better, perhaps related to trazodone although the patient herself thinks that it is the Zofran that is helping her more.  No new labs.  CA 19-9 level is still pending.  Impression:  1.  Progressive "double duct sign" with increasing dilatation of both the common bile duct and the pancreatic duct over the past 5 years, at which time EUS was unremarkable.  LFTs remain normal, but there is prominence of the ampullary region on the current MRI scan (no pancreatic mass)  2.  Weight loss  3.  Remote history of left-sided ulcerative colitis  4.  Family history of colon cancer, with most recent colonoscopy August 2020  Plan:  1.  Await CA 19-9 level 2.  Endoscopic ultrasound is scheduled for tomorrow morning  Cleotis Nipper, M.D. Pager [phone] If no answer or after 5 PM call [phone]

## 2020-05-07 NOTE — Progress Notes (Signed)
[facility] Progress Note  Patient: Erin Wolf    OJJ:[id number]  DOA: 05/04/2020     Date of Service: the patient was seen and examined on 05/07/2020  Brief hospital course: Past medical history of irritable bowel syndrome, ulcerative colitis, cholecystectomy, type II DM, bilateral arthritis of the knee and chronic pain syndrome as well as chronic anxiety.  Patient presented with worsening abdominal pain.  Work-up showed dilated CBD and intrahepatic biliary ductal dilatation which is also evident on MRCP with concern for ampullary prominence.  [facility] is consulted. Currently plan is pain control, monitor for EUS which is scheduled on Monday.  Assessment and Plan: 1.  Abdominal pain. Intrahepatic and extremity biliary ductal dilatation with CBD dilatation. Ampullary prominence Weight loss Continue home pain medication. [facility] consulted. EUS scheduled for Monday. LFTs currently stable. CA 19-9 normal 10 pound weight loss in last 3 months.  2.  Chronic pain syndrome. Patient's primary complaint right now is pain control. She reports pain control in her abdomen as well as pain control pain in both her knees is primary issue. Patient is on 10 mg of OxyIR at home every 4 hours as needed which I have received. Patient tells me that that gave her medication that starts with D in urgent care that helped her the most.  And requesting to resume that medication. Patient also has bilateral arthritis for which she gets knee injection and last 1 was performed in February.  Discussed with orthopedic. Recommend outpatient follow-up for intra-articular injection.  3.  Essential hypertension Blood pressure stable. Continue to monitor.  4.  Hyperlipidemia Currently stable.  Monitor.  5.  History of ulcerative colitis Management per GI.  6.  Anxiety and depression Patient reports that she is feeling depressed because her pain is not well controlled. Resume home medication.  7.   Obesity Body mass index is 36.61 kg/m.  Nutrition Problem: Inadequate oral intake Etiology: nausea, vomiting, poor appetite Interventions: Interventions: Boost Breeze, MVI   8.  Type 2 diabetes mellitus with hyperglycemia, uncontrolled. Currently appears to be hypoglycemia from poor p.o. intake from abdominal pain. We will start her on D5 half-normal saline for now. Continue with CBG monitoring as well as sliding scale indicates patient should stable 140. If the patient consistently stays above 180 discontinue D5.  Diet: Soft diet per GI appreciate assistance DVT Prophylaxis: Subcutaneous Heparin   enoxaparin (LOVENOX) injection 40 mg Start: 05/05/20 2200    Advance goals of care discussion: Full code  Family Communication: no family was present at bedside, at the time of interview.   Disposition:  Status is: Inpatient  Remains inpatient appropriate because:Ongoing diagnostic testing needed not appropriate for outpatient work up   Dispo: The patient is from: Home              Anticipated d/c is to: Home              Anticipated d/c date is: 3 days              Patient currently is not medically stable to d/c.  Subjective: Continues report abdominal pain reports more than 1 dose yesterday. No nausea no vomiting. No fever no chills. passing gas.  No BM.  Physical Exam:  General: Appear in mild distress, no Rash; Oral Mucosa Clear, moist. no Abnormal Neck Mass Or lumps, Conjunctiva normal  Cardiovascular: S1 and S2 Present, no Murmur, Respiratory: good respiratory effort, Bilateral Air entry present and Clear to Auscultation, no Crackles, no  wheezes Abdomen: Bowel Sound present, Soft and diffuse tenderness Extremities: no Pedal edema, no calf tenderness Neurology: alert and oriented to time, place, and person affect tearful. no new focal deficit Gait not checked due to patient safety concerns  Vitals:   05/06/20 2141 05/07/20 0633 05/07/20 1437 05/07/20 1622  BP:  138/76 117/67 (!) 119/106 117/61  Pulse: 80 74 77 77  Resp: 15 16 18    Temp: 98 F (36.7 C) 98.2 F (36.8 C) 97.6 F (36.4 C)   TempSrc: Oral  Oral   SpO2: 100% 94% 99%   Weight:      Height:        Intake/Output Summary (Last 24 hours) at 05/07/2020 1911 Last data filed at 05/07/2020 1826 Gross per 24 hour  Intake 360 ml  Output 1050 ml  Net -690 ml   Filed Weights   05/04/20 2131 05/05/20 1534  Weight: 95.3 kg 99.8 kg    Data Reviewed: I have personally reviewed and interpreted daily labs, tele strips, imagings as discussed above. I reviewed all nursing notes, pharmacy notes, vitals, pertinent old records I have discussed plan of care as described above with RN and patient/family.  CBC: Recent Labs  Lab 05/04/20 2349 05/05/20 1931 05/06/20 0621 05/07/20 0919  WBC 5.7 4.6 4.5 4.5  NEUTROABS  --   --  1.4*  --   HGB 14.4 15.0 13.1 14.7  HCT 45.5 48.2* 42.7 49.1*  MCV 80.7 84.0 83.6 84.8  PLT 345 355 317 638   Basic Metabolic Panel: Recent Labs  Lab 05/04/20 2349 05/05/20 1931 05/06/20 0621 05/07/20 1239  NA 137  --  135 138  K 3.3*  --  3.9 3.8  CL 97*  --  101 105  CO2 26  --  24 27  GLUCOSE 113*  --  98 106*  BUN 13  --  13 9  CREATININE 0.70 0.73 0.58 0.65  CALCIUM 9.4  --  8.5* 8.3*    Studies: No results found.  Scheduled Meds: . enoxaparin (LOVENOX) injection  40 mg Subcutaneous Q24H  . feeding supplement  1 Container Oral BID BM  . FLUoxetine  20 mg Oral Daily  . insulin aspart  0-5 Units Subcutaneous QHS  . insulin aspart  0-9 Units Subcutaneous TID WC  . multivitamin with minerals  1 tablet Oral Daily  . pantoprazole  40 mg Oral Daily  . sodium chloride flush  3 mL Intravenous Once   Continuous Infusions: . dextrose 5 % and 0.45% NaCl 75 mL/hr at 05/07/20 1806   PRN Meds: acetaminophen **OR** acetaminophen, cyclobenzaprine, diazepam, HYDROmorphone (DILAUDID) injection, hyoscyamine, ondansetron (ZOFRAN) IV, oxyCODONE,  senna-docusate  Time spent: 35 minutes  Author: Berle Mull, MD [facility] 05/07/2020 7:11 PM  To reach On-call, see care teams to locate the attending and reach out via [email or web address]. Between 7PM-7AM, please contact night-coverage If you still have difficulty reaching the attending provider, please page the [facility] (Director on Call) for [facility] on amion for assistance.

## 2020-05-08 ENCOUNTER — Inpatient Hospital Stay (HOSPITAL_COMMUNITY): Payer: Medicare HMO | Admitting: Anesthesiology

## 2020-05-08 ENCOUNTER — Encounter (HOSPITAL_COMMUNITY)
Admission: EM | Disposition: A | Discharge status: Home / Self Care | Payer: Self-pay | Source: Home / Self Care | Attending: Internal Medicine

## 2020-05-08 ENCOUNTER — Encounter (HOSPITAL_COMMUNITY): Payer: Self-pay | Admitting: Internal Medicine

## 2020-05-08 HISTORY — PX: UPPER ESOPHAGEAL ENDOSCOPIC ULTRASOUND (EUS): SHX6562

## 2020-05-08 HISTORY — PX: ESOPHAGOGASTRODUODENOSCOPY (EGD) WITH PROPOFOL: SHX5813

## 2020-05-08 LAB — GLUCOSE, CAPILLARY
Glucose-Capillary: 112 mg/dL — ABNORMAL HIGH (ref 70–99)
Glucose-Capillary: 79 mg/dL (ref 70–99)
Glucose-Capillary: 64 mg/dL — ABNORMAL LOW (ref 70–99)
Glucose-Capillary: 77 mg/dL (ref 70–99)
Glucose-Capillary: 92 mg/dL (ref 70–99)

## 2020-05-08 LAB — CBC
HCT: 37.8 % (ref 36.0–46.0)
Hemoglobin: 11.4 g/dL — ABNORMAL LOW (ref 12.0–15.0)
MCH: 25.7 pg — ABNORMAL LOW (ref 26.0–34.0)
MCHC: 30.2 g/dL (ref 30.0–36.0)
MCV: 85.1 fL (ref 80.0–100.0)
Platelets: 281 10*3/uL (ref 150–400)
RBC: 4.44 MIL/uL (ref 3.87–5.11)
RDW: 15.1 % (ref 11.5–15.5)
WBC: 3.3 10*3/uL — ABNORMAL LOW (ref 4.0–10.5)
nRBC: 0 % (ref 0.0–0.2)

## 2020-05-08 LAB — COMPREHENSIVE METABOLIC PANEL
ALT: 14 U/L (ref 0–44)
AST: 14 U/L — ABNORMAL LOW (ref 15–41)
Albumin: 2.9 g/dL — ABNORMAL LOW (ref 3.5–5.0)
Alkaline Phosphatase: 49 U/L (ref 38–126)
Anion gap: 3 — ABNORMAL LOW (ref 5–15)
BUN: 9 mg/dL (ref 8–23)
CO2: 28 mmol/L (ref 22–32)
Calcium: 8.2 mg/dL — ABNORMAL LOW (ref 8.9–10.3)
Chloride: 107 mmol/L (ref 98–111)
Creatinine, Ser: 0.55 mg/dL (ref 0.44–1.00)
GFR calc Af Amer: >60 mL/min (ref >60)
GFR calc non Af Amer: >60 mL/min (ref >60)
Glucose, Bld: 110 mg/dL — ABNORMAL HIGH (ref 70–99)
Potassium: 3.7 mmol/L (ref 3.5–5.1)
Sodium: 138 mmol/L (ref 135–145)
Total Bilirubin: 0.2 mg/dL — ABNORMAL LOW (ref 0.3–1.2)
Total Protein: 6.1 g/dL — ABNORMAL LOW (ref 6.5–8.1)

## 2020-05-08 SURGERY — UPPER ESOPHAGEAL ENDOSCOPIC ULTRASOUND (EUS)
Anesthesia: Monitor Anesthesia Care

## 2020-05-08 MED ORDER — ONDANSETRON HCL 4 MG/2ML IJ SOLN
INTRAMUSCULAR | Status: DC | PRN
  Administered 2020-05-08: 4 mg via INTRAVENOUS

## 2020-05-08 MED ORDER — GLUCAGON HCL RDNA (DIAGNOSTIC) 1 MG IJ SOLR
INTRAMUSCULAR | Status: AC
  Administered 2020-05-08: 1 mg
  Filled 2020-05-08: qty 1

## 2020-05-08 MED ORDER — SODIUM CHLORIDE 0.9 % IV SOLN: INTRAVENOUS | Status: DC

## 2020-05-08 MED ORDER — TRAZODONE HCL 50 MG PO TABS
50.0000 mg | ORAL_TABLET | Freq: Once | ORAL | Status: AC
  Administered 2020-05-08: 50 mg via ORAL
  Filled 2020-05-08: qty 1

## 2020-05-08 MED ORDER — LACTATED RINGERS IV SOLN: INTRAVENOUS | Status: DC

## 2020-05-08 MED ORDER — TRAZODONE HCL 50 MG PO TABS
50.0000 mg | ORAL_TABLET | Freq: Every evening | ORAL | Status: AC | PRN
  Administered 2020-05-08: 50 mg via ORAL
  Filled 2020-05-08: qty 1

## 2020-05-08 MED ORDER — PROPOFOL 10 MG/ML IV BOLUS
INTRAVENOUS | Status: DC | PRN
  Administered 2020-05-08: 20 mg via INTRAVENOUS

## 2020-05-08 MED ORDER — PROPOFOL 500 MG/50ML IV EMUL
INTRAVENOUS | Status: DC | PRN
  Administered 2020-05-08: 150 ug/kg/min via INTRAVENOUS

## 2020-05-08 MED ORDER — PROPOFOL 1000 MG/100ML IV EMUL
INTRAVENOUS | Status: AC
  Filled 2020-05-08: qty 100

## 2020-05-08 MED ORDER — LIDOCAINE 2% (20 MG/ML) 5 ML SYRINGE
INTRAMUSCULAR | Status: DC | PRN
  Administered 2020-05-08: 100 mg via INTRAVENOUS

## 2020-05-08 MED ORDER — GLUCAGON HCL RDNA (DIAGNOSTIC) 1 MG IJ SOLR
INTRAMUSCULAR | Status: AC
  Filled 2020-05-08: qty 1

## 2020-05-08 NOTE — Transfer of Care (Signed)
Immediate Anesthesia Transfer of Care Note  Patient: Erin Wolf  Procedure(s) Performed: UPPER ESOPHAGEAL ENDOSCOPIC ULTRASOUND (EUS) (N/A )  Patient Location: PACU  Anesthesia Type:MAC  Level of Consciousness: awake, alert  and oriented  Airway & Oxygen Therapy: Patient Spontanous Breathing and Patient connected to face mask oxygen  Post-op Assessment: Report given to RN and Post -op Vital signs reviewed and stable  Post vital signs: Reviewed and stable  Last Vitals:  Vitals Value Taken Time  BP 103/57 05/08/20 1050  Temp    Pulse 64 05/08/20 1051  Resp 12 05/08/20 1051  SpO2 100 % 05/08/20 1051  Vitals shown include unvalidated device data.  Last Pain:  Vitals:   05/08/20 0918  TempSrc: Oral  PainSc: 0-No pain      Patients Stated Pain Goal: 3 (50/93/26 7124)  Complications: No complications documented.

## 2020-05-08 NOTE — Op Note (Signed)
[facility] Patient Name: Erin Wolf Procedure Date: 05/08/2020 MRN: [id number] Attending MD: Arta Silence , MD Date of Birth: 1956-05-25 CSN: [id number] Age: 64 Admit Type: Inpatient Procedure:                Upper EUS Indications:              Suspected mass at the ampulla/papilla on MRCP,                            dilated PD/CBD (chronic), normal Ca 19-9 Providers:                Arta Silence, MD, Cleda Daub, RN, Cherylynn Ridges, Technician, Tyrone Apple, Technician,                            Virgia Land, CRNA Referring MD:             [facility] Medicines:                Monitored Anesthesia Care Complications:            No immediate complications. Estimated Blood Loss:     Estimated blood loss: none. Procedure:                Pre-Anesthesia Assessment:                           - Prior to the procedure, a History and Physical                            was performed, and patient medications and                            allergies were reviewed. The patient's tolerance of                            previous anesthesia was also reviewed. The risks                            and benefits of the procedure and the sedation                            options and risks were discussed with the patient.                            All questions were answered, and informed consent                            was obtained. Prior Anticoagulants: The patient has                            taken Lovenox (enoxaparin), last dose was 1 day  prior to procedure. ASA Grade Assessment: III - A                            patient with severe systemic disease. After                            reviewing the risks and benefits, the patient was                            deemed in satisfactory condition to undergo the                            procedure.                           After obtaining informed  consent, the endoscope was                            passed under direct vision. Throughout the                            procedure, the patient's blood pressure, pulse, and                            oxygen saturations were monitored continuously. The                            [id number] ([id number]) Olympus Duodenoscope was                            introduced through the mouth, and advanced to the                            second part of duodenum. The [id number] ([id number])                            Olympus Radial EUS was introduced through the                            mouth, and advanced to the second part of duodenum.                            The upper EUS was accomplished without difficulty.                            The patient tolerated the procedure well. Scope In: Scope Out: Findings:      ENDOSCOPIC FINDING: :      A medium amount of food (residue) was found in the gastric body and in       the gastric antrum.      There was retained food in the duodenum as well; with time, most of this       was flushed distally to enable views of the periampullary region. The       region of the major papilla was  very redundant and prominent; however,       it mucosally appeared normal and there was no obvious evidence of       ampullary mass.      ENDOSONOGRAPHIC FINDING: :      Evidence of a previous cholecystectomy was identified       endosonographically.      There was dilation in the common bile duct which measured up to 10 mm.      The pancreatic duct had a dilated endosonographic appearance in the main       pancreatic duct. The pancreatic duct measured up to 5 mm in diameter.      No lymphadenopathy seen.      Prominent ampulla without obvious evidence of mass.      There was no sign of significant endosonographic abnormality in the       pancreatic head, genu of the pancreas, uncinate process of the pancreas       and pancreatic neck. No masses, no  calcifications. Impression:               - A medium amount of food (residue) in the stomach                            and proximal duodenum.                           - Prominent but otherwise-appearing major papilla.                           - Evidence of a cholecystectomy.                           - There was dilation in the common bile duct which                            measured up to 10 mm.                           - The pancreatic duct had a dilated endosonographic                            appearance in the main pancreatic duct. The                            pancreatic duct measured up to 5 mm in diameter.                           - There was no sign of significant pathology in the                            pancreatic head, genu of the pancreas, uncinate                            process of the pancreas and pancreatic neck. Moderate Sedation:      Not Applicable - Patient had care per Anesthesia. Recommendation:           - Return patient to  hospital ward for ongoing care.                           - Mechanical soft diet today.                           - Advance diet as tolerated today.                           - Upon review of patient's chart and imaging, she                            has had dilated CBD +/- PD for better part of 10                            years, and suggestive of possible ampullary mass                            has been noted since 2017. She had EUS 2017 for                            same reason (dilated cbd, pd, possible ampullary                            mass) and same findings noted now as it was then.                            As a result, I don't think further GI investigation                            is needed at this time, in absence of new/interval                            symptoms (elevated LFTs or more compelling reason                            for concern of pancreatic/ampullary lesion).                           [facility] will sign-off; we can arrange outpatient                            follow-up with [place]; please call with any questions;                            thank you for the consultation. Procedure Code(s):        --- Professional ---                           [phone], Esophagogastroduodenoscopy, flexible,                            transoral; with endoscopic ultrasound examination,  including the esophagus, stomach, and either the                            duodenum or a surgically altered stomach where the                            jejunum is examined distal to the anastomosis Diagnosis Code(s):        --- Professional ---                           Z90.49, Acquired absence of other specified parts                            of digestive tract                           K83.8, Other specified diseases of biliary tract                           R93.3, Abnormal findings on diagnostic imaging of                            other parts of digestive tract                           R93.2, Abnormal findings on diagnostic imaging of                            liver and biliary tract CPT copyright 2019 [employer]. All rights reserved. The codes documented in this report are preliminary and upon coder review may  be revised to meet current compliance requirements. Arta Silence, MD 05/08/2020 11:38:28 AM This report has been signed electronically. Number of Addenda: 0

## 2020-05-08 NOTE — Interval H&P Note (Signed)
History and Physical Interval Note:  05/08/2020 10:02 AM  Erin Wolf  has presented today for surgery, with the diagnosis of [id number].  The various methods of treatment have been discussed with the patient and family. After consideration of risks, benefits and other options for treatment, the patient has consented to  Procedure(s): UPPER ESOPHAGEAL ENDOSCOPIC ULTRASOUND (EUS) (N/A) as a surgical intervention.  The patient's history has been reviewed, patient examined, no change in status, stable for surgery.  I have reviewed the patient's chart and labs.  Questions were answered to the patient's satisfaction.     Surya Schroeter M   Assessment:   Dilated pancreatic duct and bile ducts, chronic but intervally worsening.  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  Weight loss.  Normal LFTs.  Plan:   Upper endoscopy (with side-viewing scope) followed by upper endoscopic ultrasound with possible fine needle aspiration.  Risks (bleeding, infection, bowel perforation that could require surgery, sedation-related changes in cardiopulmonary systems), benefits (identification and possible treatment of source of symptoms, exclusion of certain causes of symptoms), and alternatives (watchful waiting, radiographic imaging studies, empiric medical treatment) of upper endoscopy with possible biopsies, and upper endoscopic ultrasound with possible fine needle aspiration (EGD +/- Bx, EUS +/- FNA) were explained to patient/family in detail and patient wishes to proceed.

## 2020-05-08 NOTE — Anesthesia Postprocedure Evaluation (Addendum)
Anesthesia Post Note  Patient: Erin Wolf  Procedure(s) Performed: UPPER ESOPHAGEAL ENDOSCOPIC ULTRASOUND (EUS) (N/A )     Patient location during evaluation: PACU Anesthesia Type: MAC Level of consciousness: awake and alert and oriented Pain management: pain level controlled Vital Signs Assessment: post-procedure vital signs reviewed and stable Respiratory status: spontaneous breathing, nonlabored ventilation and respiratory function stable Cardiovascular status: stable and blood pressure returned to baseline Postop Assessment: no apparent nausea or vomiting Anesthetic complications: no   No complications documented.  Last Vitals:  Vitals:   05/08/20 1150 05/08/20 1217  BP: (!) 111/57 105/69  Pulse: 66 65  Resp: 13 20  Temp:  (!) 36.4 C  SpO2: 97% 100%    Last Pain:  Vitals:   05/08/20 1217  TempSrc: Oral  PainSc:                  Laneice Meneely A.

## 2020-05-08 NOTE — Anesthesia Preprocedure Evaluation (Addendum)
Anesthesia Evaluation  Patient identified by MRN, date of birth, ID band Patient awake    Reviewed: Allergy & Precautions, NPO status , Patient's Chart, lab work & pertinent test results  Airway Mallampati: II  TM Distance: >3 FB Neck ROM: Full    Dental  (+) Missing   Pulmonary neg pulmonary ROS,    Pulmonary exam normal breath sounds clear to auscultation       Cardiovascular hypertension, Pt. on medications Normal cardiovascular exam Rhythm:Regular Rate:Normal     Neuro/Psych  Headaches, Seizures -, Well Controlled,  PSYCHIATRIC DISORDERS Anxiety Depression Dysthymic disorder Hx/o panic attacks Neuromuscular disease    GI/Hepatic PUD, GERD  Medicated and Controlled,(+) Hepatitis -, CUlcerative colitis Diverticulosis IBS   Endo/Other  diabetes, Well Controlled, Type 2Obesity Hyperlipidemia  Renal/GU negative Renal ROS  negative genitourinary   Musculoskeletal  (+) Arthritis , Fibromyalgia -SLE   Abdominal (+) + obese,   Peds  Hematology  (+) anemia , Enoxaparin therapy   Anesthesia Other Findings   Reproductive/Obstetrics                            Anesthesia Physical Anesthesia Plan  ASA: III  Anesthesia Plan: MAC   Post-op Pain Management:    Induction: Intravenous  PONV Risk Score and Plan: Propofol infusion and Treatment may vary due to age or medical condition  Airway Management Planned: Natural Airway and Nasal Cannula  Additional Equipment:   Intra-op Plan:   Post-operative Plan:   Informed Consent: I have reviewed the patients History and Physical, chart, labs and discussed the procedure including the risks, benefits and alternatives for the proposed anesthesia with the patient or authorized representative who has indicated his/her understanding and acceptance.     Dental advisory given  Plan Discussed with: CRNA and Anesthesiologist  Anesthesia Plan  Comments:         Anesthesia Quick Evaluation

## 2020-05-08 NOTE — Progress Notes (Addendum)
[facility] Progress Note  Patient: Erin Wolf    NGE:[id number]  DOA: 05/04/2020     Date of Service: the patient was seen and examined on 05/08/2020  Brief hospital course: Past medical history of irritable bowel syndrome, ulcerative colitis, cholecystectomy, type II DM, bilateral arthritis of the knee and chronic pain syndrome as well as chronic anxiety.  Patient presented with worsening abdominal pain.  Work-up showed dilated CBD and intrahepatic biliary ductal dilatation which is also evident on MRCP with concern for ampullary prominence.  [facility] is consulted. Currently plan is monitor post ERCP recovery and plan discharge tomorrow.  Assessment and Plan: 1.  Abdominal pain. Intrahepatic and extremity biliary ductal dilatation with CBD dilatation. Ampullary prominence Weight loss Continue home pain medication. [facility] consulted. Underwent ERCP for potential EUS. No acute abnormality other than prominent papula identified. No further work-up recommended by [facility]. LFTs currently stable. CA 19-9 normal  2.  Chronic pain syndrome. Patient's primary complaint right now is pain control. She reports pain control in her abdomen as well as pain control pain in both her knees is primary issue. Patient is on 10 mg of OxyIR at home every 4 hours as needed which I have received. Patient tells me that that gave her medication that starts with D in urgent care that helped her the most.  And requesting to resume that medication. Patient also has bilateral arthritis for which she gets knee injection and last 1 was performed in February.  Discussed with orthopedic. Recommend outpatient follow-up for intra-articular injection.  3.  Essential hypertension Blood pressure stable. Continue to monitor.  4.  Hyperlipidemia Currently stable.  Monitor.  5.  History of ulcerative colitis Management per GI.  6.  Anxiety and depression Patient reports that she is feeling depressed  because her pain is not well controlled. Resume home medication.  7.  Obesity Body mass index is 36.61 kg/m.  Nutrition Problem: Inadequate oral intake Etiology: nausea, vomiting, poor appetite Interventions: Interventions: Boost Breeze, MVI   8.  Type 2 diabetes mellitus with hypo and hyperglycemia, uncontrolled. Currently appears to be hypoglycemia from poor p.o. intake from abdominal pain. On D5 half-normal saline. Anticipating improvement in CBG after initiation of the diet. Continue with CBG monitoring as well as sliding scale indicates patient should stable 140. If the patient consistently stays above 180 discontinue D5.  Diet: Soft diet per GI appreciate assistance DVT Prophylaxis: Subcutaneous Heparin   enoxaparin (LOVENOX) injection 40 mg Start: 05/05/20 2200    Advance goals of care discussion: Full code  Family Communication: no family was present at bedside, at the time of interview.   Disposition:  Status is: Inpatient  Remains inpatient appropriate because:Ongoing diagnostic testing needed not appropriate for outpatient work up   Dispo: The patient is from: Home              Anticipated d/c is to: Home              Anticipated d/c date is: 3 days              Patient currently is not medically stable to d/c.  Subjective: Continues to report abdominal pain.  No nausea no vomiting.  No fever no chills.  Physical Exam:  General: Appear in mild distress, no Rash; Oral Mucosa Clear, moist. no Abnormal Neck Mass Or lumps, Conjunctiva normal  Cardiovascular: S1 and S2 Present, no Murmur, Respiratory: good respiratory effort, Bilateral Air entry present and Clear to Auscultation,  no Crackles, no wheezes Abdomen: Bowel Sound present, Soft and diffuse tenderness Extremities: no Pedal edema, no calf tenderness Neurology: alert and oriented to time, place, and person affect tearful. no new focal deficit Gait not checked due to patient safety concerns  Vitals:    05/08/20 1130 05/08/20 1140 05/08/20 1150 05/08/20 1217  BP: 122/65 115/62 (!) 111/57 105/69  Pulse: 69 69 66 65  Resp: 12 14 13 20   Temp:    (!) 97.5 F (36.4 C)  TempSrc:    Oral  SpO2: 97% 97% 97% 100%  Weight:      Height:        Intake/Output Summary (Last 24 hours) at 05/08/2020 1814 Last data filed at 05/08/2020 1341 Gross per 24 hour  Intake 3454 ml  Output 240 ml  Net 3214 ml   Filed Weights   05/04/20 2131 05/05/20 1534  Weight: 95.3 kg 99.8 kg    Data Reviewed: I have personally reviewed and interpreted daily labs, tele strips, imagings as discussed above. I reviewed all nursing notes, pharmacy notes, vitals, pertinent old records I have discussed plan of care as described above with RN and patient/family.  CBC: Recent Labs  Lab 05/04/20 2349 05/05/20 1931 05/06/20 0621 05/07/20 0919 05/08/20 0618  WBC 5.7 4.6 4.5 4.5 3.3*  NEUTROABS  --   --  1.4*  --   --   HGB 14.4 15.0 13.1 14.7 11.4*  HCT 45.5 48.2* 42.7 49.1* 37.8  MCV 80.7 84.0 83.6 84.8 85.1  PLT 345 355 317 200 426   Basic Metabolic Panel: Recent Labs  Lab 05/04/20 2349 05/05/20 1931 05/06/20 0621 05/07/20 1239 05/08/20 0618  NA 137  --  135 138 138  K 3.3*  --  3.9 3.8 3.7  CL 97*  --  101 105 107  CO2 26  --  24 27 28   GLUCOSE 113*  --  98 106* 110*  BUN 13  --  13 9 9   CREATININE 0.70 0.73 0.58 0.65 0.55  CALCIUM 9.4  --  8.5* 8.3* 8.2*    Studies: No results found.  Scheduled Meds: . enoxaparin (LOVENOX) injection  40 mg Subcutaneous Q24H  . feeding supplement  1 Container Oral BID BM  . FLUoxetine  20 mg Oral Daily  . insulin aspart  0-5 Units Subcutaneous QHS  . insulin aspart  0-9 Units Subcutaneous TID WC  . multivitamin with minerals  1 tablet Oral Daily  . pantoprazole  40 mg Oral Daily  . sodium chloride flush  3 mL Intravenous Once   Continuous Infusions: . dextrose 5 % and 0.45% NaCl 75 mL/hr at 05/07/20 1806   PRN Meds: acetaminophen **OR** acetaminophen,  cyclobenzaprine, diazepam, HYDROmorphone (DILAUDID) injection, hyoscyamine, ondansetron (ZOFRAN) IV, oxyCODONE, senna-docusate  Time spent: 35 minutes  Author: Berle Mull, MD [facility] 05/08/2020 6:14 PM  To reach On-call, see care teams to locate the attending and reach out via [email or web address]. Between 7PM-7AM, please contact night-coverage If you still have difficulty reaching the attending provider, please page the [facility] (Director on Call) for [facility] on amion for assistance.

## 2020-05-09 ENCOUNTER — Encounter (HOSPITAL_COMMUNITY): Payer: Self-pay | Admitting: Gastroenterology

## 2020-05-09 LAB — COMPREHENSIVE METABOLIC PANEL
ALT: 12 U/L (ref 0–44)
AST: 12 U/L — ABNORMAL LOW (ref 15–41)
Albumin: 2.7 g/dL — ABNORMAL LOW (ref 3.5–5.0)
Alkaline Phosphatase: 50 U/L (ref 38–126)
Anion gap: 7 (ref 5–15)
BUN: 8 mg/dL (ref 8–23)
CO2: 26 mmol/L (ref 22–32)
Calcium: 8.2 mg/dL — ABNORMAL LOW (ref 8.9–10.3)
Chloride: 108 mmol/L (ref 98–111)
Creatinine, Ser: 0.5 mg/dL (ref 0.44–1.00)
GFR calc Af Amer: >60 mL/min (ref >60)
GFR calc non Af Amer: >60 mL/min (ref >60)
Glucose, Bld: 100 mg/dL — ABNORMAL HIGH (ref 70–99)
Potassium: 3.9 mmol/L (ref 3.5–5.1)
Sodium: 141 mmol/L (ref 135–145)
Total Bilirubin: 0.3 mg/dL (ref 0.3–1.2)
Total Protein: 5.8 g/dL — ABNORMAL LOW (ref 6.5–8.1)

## 2020-05-09 LAB — CBC
HCT: 37.8 % (ref 36.0–46.0)
Hemoglobin: 11.5 g/dL — ABNORMAL LOW (ref 12.0–15.0)
MCH: 26 pg (ref 26.0–34.0)
MCHC: 30.4 g/dL (ref 30.0–36.0)
MCV: 85.5 fL (ref 80.0–100.0)
Platelets: 274 10*3/uL (ref 150–400)
RBC: 4.42 MIL/uL (ref 3.87–5.11)
RDW: 15.3 % (ref 11.5–15.5)
WBC: 3.8 10*3/uL — ABNORMAL LOW (ref 4.0–10.5)
nRBC: 0 % (ref 0.0–0.2)

## 2020-05-09 LAB — GLUCOSE, CAPILLARY: Glucose-Capillary: 88 mg/dL (ref 70–99)

## 2020-05-09 MED ORDER — HYOSCYAMINE SULFATE 0.125 MG PO TBDP: 0.2500 mg | ORAL_TABLET | Freq: Four times a day (QID) | ORAL | 0 refills | Status: DC | PRN

## 2020-05-09 MED ORDER — DOCUSATE SODIUM 100 MG PO CAPS
100.0000 mg | ORAL_CAPSULE | Freq: Two times a day (BID) | ORAL | 0 refills | Status: DC
Start: 2020-05-09 — End: 2020-12-20

## 2020-05-09 MED ORDER — POLYETHYLENE GLYCOL 3350 17 G PO PACK: 17.0000 g | PACK | Freq: Every day | ORAL | 0 refills | Status: DC

## 2020-05-09 NOTE — Plan of Care (Signed)
Pt VS WNL this am.  No complaints at this time.   Problem: Education: Goal: Knowledge of General Education information will improve Description: Including pain rating scale, medication(s)/side effects and non-pharmacologic comfort measures Outcome: Progressing   Problem: Health Behavior/Discharge Planning: Goal: Ability to manage health-related needs will improve Outcome: Progressing   Problem: Clinical Measurements: Goal: Ability to maintain clinical measurements within normal limits will improve Outcome: Progressing Goal: Will remain free from infection Outcome: Progressing Goal: Diagnostic test results will improve Outcome: Progressing Goal: Respiratory complications will improve Outcome: Progressing Goal: Cardiovascular complication will be avoided Outcome: Progressing   Problem: Activity: Goal: Risk for activity intolerance will decrease Outcome: Progressing   Problem: Nutrition: Goal: Adequate nutrition will be maintained Outcome: Progressing   Problem: Coping: Goal: Level of anxiety will decrease Outcome: Progressing   Problem: Elimination: Goal: Will not experience complications related to bowel motility Outcome: Progressing Goal: Will not experience complications related to urinary retention Outcome: Progressing   Problem: Pain Managment: Goal: General experience of comfort will improve Outcome: Progressing   Problem: Safety: Goal: Ability to remain free from injury will improve Outcome: Progressing   Problem: Skin Integrity: Goal: Risk for impaired skin integrity will decrease Outcome: Progressing   

## 2020-05-09 NOTE — Progress Notes (Signed)
Pt VS WNL this am.  Discharge instructions provided to and reviewed with patient.  PIV discontinued.  Catheter intact and clotting within expected timeframe.  Pt transported to front lobby via wheelchair by [facility] staff.

## 2020-05-15 NOTE — Discharge Summary (Signed)
[facility] Discharge Summary   Patient: Erin Wolf:[id number]  PCP: Shirline Frees, MD  Date of admission: 05/04/2020   Date of discharge: 05/09/2020      Discharge Diagnoses:  Principal Problem:   Chronic abdominal pain Active Problems:   HTN (hypertension), benign   Hyperlipidemia   Fibroids   Ulcerative colitis ([place])   Hypokalemia   Dehydration   Chronic pain of left knee   Admitted From: home Disposition:  Home   Recommendations for Outpatient Follow-up:  1. PCP: follow up in 1 week 2. Follow up LABS/TEST:  none   Follow-up Information    Paralee Cancel, MD. Schedule an appointment as soon as possible for a visit in 2 week(s).   Specialty: Orthopedic Surgery Contact information: [address] [place] [address] [place]  [address] [phone]        Shirline Frees, MD. Schedule an appointment as soon as possible for a visit in 1 week(s).   Specialty: Family Medicine Contact information: [place] [address] [phone]              Diet recommendation: Cardiac diet  Activity: The patient is advised to gradually reintroduce usual activities, as tolerated  Discharge Condition: stable  Code Status: Full code   History of present illness: As per the H and P dictated on admission, "Erin Wolf is a 64 y.o. female with medical history significant of IBS, hx UC, s/p cholecystectomy, DM who presented to [facility] with complaints of worsening abd pain with nausea and vomiting that started one day prior to admit with pain worsening 2-3 days prior. Pt has reported decreased PO intake as a result. Pt denies fevers or chills. Denies chest pains. "  Hospital Course:  Summary of her active problems in the hospital is as following. 1.  Abdominal pain. Intrahepatic and extremity biliary ductal dilatation with CBD dilatation. Ampullary prominence Weight loss Continue home pain medication. [facility] consulted. Underwent ERCP  for potential EUS. No acute abnormality other than prominent papula identified. No further work-up recommended by [facility]. LFTs currently stable. CA 19-9 normal  2.  Chronic pain syndrome. Patient's primary complaint right now is pain control. She reports pain control in her abdomen as well as  pain in both her knees is primary issue. Patient is on 10 mg of OxyIR at home every 4 hours Patient also has bilateral arthritis for which she gets knee injection and last 1 was performed in February. Discussed with orthopedic.Recommend outpatient follow-up for intra-articular injection.  3.  Essential hypertension Blood pressure stable. Continue to monitor.  4.  Hyperlipidemia Currently stable.  Monitor.  5.  History of ulcerative colitis Management per GI.  6.  Anxiety and depression Patient reports that she is feeling depressed because her pain is not well controlled. Resume home medication.  7.  Obesity Body mass index is 36.61 kg/m.  Nutrition Problem: Inadequate oral intake Etiology: nausea, vomiting, poor appetite Interventions: Interventions: Boost Breeze, MVI   8.  Type 2 diabetes mellitus with hypo and hyperglycemia, uncontrolled. Occasional hypoglycemia from poor p.o. intake from abdominal pain. improvement in CBG after initiation of the diet.  Patient was ambulatory without any assistance. On the day of the discharge the patient's vitals were stable, and no other acute medical condition were reported by patient. the patient was felt safe to be discharge at Home with no therapy needed on discharge.  Consultants: Gastroenterology  Procedures: ERCP  Discharge Exam: General: Appear in mild  distress, no Rash; Oral Mucosa Clear, moist. Cardiovascular: S1 and S2 Present, no Murmur, Respiratory: normal respiratory effort, Bilateral Air entry present and no Crackles, no wheezes Abdomen: Bowel Sound present, Soft and no tenderness, no hernia Extremities: no Pedal  edema, no calf tenderness Neurology: alert and oriented to time, place, and person affect appropriate.  Filed Weights   05/04/20 2131 05/05/20 1534  Weight: 95.3 kg 99.8 kg   Vitals:   05/08/20 2126 05/09/20 0542  BP: 112/66 116/69  Pulse: 75 76  Resp: 17 16  Temp: 98.2 F (36.8 C) (!) 97.5 F (36.4 C)  SpO2: 96% 97%    DISCHARGE MEDICATION: Allergies as of 05/09/2020      Reactions   Latex Other (See Comments)   Peels skin   Nsaids Other (See Comments)   HAS COLITIS FLARES   Penicillins Hives   Has patient had a PCN reaction causing immediate rash, facial/tongue/throat swelling, SOB or lightheadedness with hypotension: Yes Has patient had a PCN reaction causing severe rash involving mucus membranes or skin necrosis: Unknown Has patient had a PCN reaction that required hospitalization: no Has patient had a PCN reaction occurring within the last 10 years: No If all of the above answers are "NO", then may proceed with Cephalosporin use.   Wellbutrin [bupropion Hcl] Other (See Comments)   Insomnia and headaches   Aspirin Nausea Only   Darvocet [propoxyphene N-acetaminophen] Nausea And Vomiting   Isometheptene-dichloral-apap Nausea Only   IV dye?   Tape Hives   Toradol [ketorolac Tromethamine] Nausea And Vomiting   Tylenol [acetaminophen] Nausea Only   Milk-related Compounds    Can cause diarrhea   Other    Spicy foods cause diarrhea due to colitis      Medication List    TAKE these medications   cyclobenzaprine 10 MG tablet Commonly known as: FLEXERIL Take 10 mg by mouth as needed for muscle spasms.   diazepam 5 MG tablet Commonly known as: VALIUM Take 5 mg by mouth 2 (two) times daily as needed for anxiety.   docusate sodium 100 MG capsule Commonly known as: Colace Take 1 capsule (100 mg total) by mouth 2 (two) times daily.   esomeprazole 40 MG capsule Commonly known as: NEXIUM Take 40 mg by mouth daily before breakfast.   FLUoxetine 20 MG  capsule Commonly known as: PROZAC Take 20 mg by mouth daily.   hyoscyamine 0.125 MG Tbdp disintergrating tablet Commonly known as: ANASPAZ Take 2 tablets (0.25 mg total) by mouth every 6 (six) hours as needed for cramping (abdominal pain).   Oxycodone HCl 10 MG Tabs Take 10 mg by mouth 3 (three) times daily as needed for pain.   polyethylene glycol 17 g packet Commonly known as: MiraLax Take 17 g by mouth daily.   SYSTANE OP Place 1 drop into both eyes 3 (three) times daily as needed (dry eyes.).   triamterene-hydrochlorothiazide 75-50 MG tablet Commonly known as: MAXZIDE Take 1 tablet by mouth every morning.   WOMENS MULTI PO Take 1 tablet by mouth daily.      Allergies  Allergen Reactions  . Latex Other (See Comments)    Peels skin  . Nsaids Other (See Comments)    HAS COLITIS FLARES  . Penicillins Hives    Has patient had a PCN reaction causing immediate rash, facial/tongue/throat swelling, SOB or lightheadedness with hypotension: Yes Has patient had a PCN reaction causing severe rash involving mucus membranes or skin necrosis: Unknown Has patient had a PCN reaction  that required hospitalization: no Has patient had a PCN reaction occurring within the last 10 years: No If all of the above answers are "NO", then may proceed with Cephalosporin use.   . Wellbutrin [Bupropion Hcl] Other (See Comments)    Insomnia and headaches  . Aspirin Nausea Only  . Darvocet [Propoxyphene N-Acetaminophen] Nausea And Vomiting  . Isometheptene-Dichloral-Apap Nausea Only    IV dye?  Marland Kitchen Tape Hives  . Toradol [Ketorolac Tromethamine] Nausea And Vomiting  . Tylenol [Acetaminophen] Nausea Only  . Milk-Related Compounds     Can cause diarrhea  . Other     Spicy foods cause diarrhea due to colitis   Discharge Instructions    Diet - low sodium heart healthy   Complete by: As directed    Increase activity slowly   Complete by: As directed       The results of significant diagnostics  from this hospitalization (including imaging, microbiology, ancillary and laboratory) are listed below for reference.    Significant Diagnostic Studies: CT ABDOMEN PELVIS W CONTRAST  Result Date: 05/05/2020 CLINICAL DATA:  64 year old female with right-sided abdominal pain. EXAM: CT ABDOMEN AND PELVIS WITH CONTRAST TECHNIQUE: Multidetector CT imaging of the abdomen and pelvis was performed using the standard protocol following bolus administration of intravenous contrast. CONTRAST:  180mL OMNIPAQUE IOHEXOL 300 MG/ML  SOLN COMPARISON:  CT abdomen pelvis dated 04/09/2020. FINDINGS: Lower chest: The visualized lung bases are clear. No intra-abdominal free air or free fluid. Hepatobiliary: Apparent mild fatty infiltration of the liver. There is intrahepatic biliary ductal dilatation progressed since the study of 2018. Cholecystectomy. No retained calcified stone noted in the central CBD. Pancreas: There is dilatation of the main pancreatic duct measuring up to approximately 6 mm. There is apparent 15 x 17 mm soft tissue fullness in the region of the ampulla (24/2 and 44/5). Further evaluation with MRI/MRCP is recommended to exclude an ampullary mass. There is mild distal pancreatic atrophy. Spleen: Normal in size without focal abnormality. Adrenals/Urinary Tract: The right adrenal gland is unremarkable. There is a 1 cm fatty left adrenal nodule, likely an adenoma. The kidneys appear unremarkable. There is no hydronephrosis on either side. There is symmetric enhancement and excretion of contrast by both kidneys. The visualized ureters and urinary bladder appear unremarkable. Stomach/Bowel: There is sigmoid diverticulosis without active inflammatory changes. There is loose stool throughout the colon compatible with diarrheal state. Correlation with clinical exam and stool cultures recommended. There is no bowel obstruction. The appendix is normal. Vascular/Lymphatic: The abdominal aorta and IVC are unremarkable. No  portal venous gas. There is no adenopathy. Reproductive: Hysterectomy. No adnexal masses. Other: None Musculoskeletal: Degenerative changes of the spine. No acute osseous pathology. IMPRESSION: 1. Interval progression of the intrahepatic biliary ductal dilatation since the study of 2018. 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 Further evaluation with MRI/MRCP is recommended. 2. Diarrheal state. Correlation with clinical exam and stool cultures recommended. No bowel obstruction. Normal appendix. 3. Colonic diverticulosis. 4. A 1 cm fatty left adrenal nodule, likely an adenoma. Electronically Signed   By: Anner Crete M.D.   On: 05/05/2020 01:42   MR 3D Recon At Scanner  Result Date: 05/05/2020 CLINICAL DATA:  Right-sided abdominal pain, biliary dilation, possible ampullary mass EXAM: MRI ABDOMEN WITHOUT AND WITH CONTRAST (INCLUDING MRCP) TECHNIQUE: Multiplanar multisequence MR imaging of the abdomen was performed both before and after the administration of intravenous contrast. Heavily T2-weighted images of the biliary  and pancreatic ducts were obtained, and three-dimensional MRCP images were rendered by post processing. CONTRAST:  57mL GADAVIST GADOBUTROL 1 MMOL/ML IV SOLN COMPARISON:  05/05/2020, 04/09/2020 FINDINGS: Lower chest: No acute findings. Hepatobiliary: Patient is status post cholecystectomy. Mild intrahepatic biliary duct dilation is noted. Common bile duct measures up to 8 mm in diameter, with no evidence of choledocholithiasis. There is smooth tapering of the common bile duct at the ampulla. The soft tissue fullness in the region of the ampulla seen on recent CT is again appreciated on MRI measuring up to 1.2 cm in size. This does demonstrate enhancement in this area on postcontrast imaging, though peristalsis and respiratory motion does somewhat limit the evaluation. ERCP is recommended for direct visualization. The remainder  of the liver is unremarkable. No focal parenchymal abnormalities. Pancreas: There is pancreatic duct dilation measuring up to 6 mm in the region of the head and body. No filling defects. No evidence of pancreatic divisum. Pancreatic parenchyma enhances normally. Spleen:  Within normal limits in size and appearance. 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 Otherwise the kidneys and adrenals are normal. Stomach/Bowel: Visualized portions within the abdomen are unremarkable. Vascular/Lymphatic: No pathologically enlarged lymph nodes identified. No abdominal aortic aneurysm demonstrated. Other:  None. Musculoskeletal: No suspicious bone lesions identified. IMPRESSION: 1. Intra and extrahepatic biliary duct dilation, as well as dilatation of the pancreatic duct, unchanged since CT. 2. Soft tissue prominence in the region of the ampulla, with faint enhancement on postcontrast imaging. Ampullary mass is suspected, though evaluation is limited by respiratory motion. ERCP is recommended for direct visualization. 3. Left adrenal nodule containing macroscopic fat, favor adrenal myelolipoma. Electronically Signed   By: Randa Ngo M.D.   On: 05/05/2020 19:25   MR ABDOMEN MRCP W WO CONTAST  Result Date: 05/05/2020 CLINICAL DATA:  Right-sided abdominal pain, biliary dilation, possible ampullary mass EXAM: MRI ABDOMEN WITHOUT AND WITH CONTRAST (INCLUDING MRCP) TECHNIQUE: Multiplanar multisequence MR imaging of the abdomen was performed both before and after the administration of intravenous contrast. Heavily T2-weighted images of the biliary and pancreatic ducts were obtained, and three-dimensional MRCP images were rendered by post processing. CONTRAST:  69mL GADAVIST GADOBUTROL 1 MMOL/ML IV SOLN COMPARISON:  05/05/2020, 04/09/2020 FINDINGS: Lower chest: No acute findings. Hepatobiliary: Patient is status post cholecystectomy. Mild intrahepatic biliary duct dilation is noted. Common bile duct  measures up to 8 mm in diameter, with no evidence of choledocholithiasis. There is smooth tapering of the common bile duct at the ampulla. The soft tissue fullness in the region of the ampulla seen on recent CT is again appreciated on MRI measuring up to 1.2 cm in size. This does demonstrate enhancement in this area on postcontrast imaging, though peristalsis and respiratory motion does somewhat limit the evaluation. ERCP is recommended for direct visualization. The remainder of the liver is unremarkable. No focal parenchymal abnormalities. Pancreas: There is pancreatic duct dilation measuring up to 6 mm in the region of the head and body. No filling defects. No evidence of pancreatic divisum. Pancreatic parenchyma enhances normally. Spleen:  Within normal limits in size and appearance. 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 Otherwise the kidneys and adrenals are normal. Stomach/Bowel: Visualized portions within the abdomen are unremarkable. Vascular/Lymphatic: No pathologically enlarged lymph nodes identified. No abdominal aortic aneurysm demonstrated. Other:  None. Musculoskeletal: No suspicious bone lesions identified. IMPRESSION: 1. Intra and extrahepatic biliary duct dilation, as well as dilatation of the pancreatic duct, unchanged since CT. 2. Soft tissue prominence in the  region of the ampulla, with faint enhancement on postcontrast imaging. Ampullary mass is suspected, though evaluation is limited by respiratory motion. ERCP is recommended for direct visualization. 3. Left adrenal nodule containing macroscopic fat, favor adrenal myelolipoma. Electronically Signed   By: Randa Ngo M.D.   On: 05/05/2020 19:25    Microbiology: No results found for this or any previous visit (from the past 240 hour(s)).   Labs: CBC: Recent Labs  Lab 05/09/20 0601  WBC 3.8*  HGB 11.5*  HCT 37.8  MCV 85.5  PLT 390   Basic Metabolic Panel: Recent Labs  Lab 05/09/20 0601  NA  141  K 3.9  CL 108  CO2 26  GLUCOSE 100*  BUN 8  CREATININE 0.50  CALCIUM 8.2*   Liver Function Tests: Recent Labs  Lab 05/09/20 0601  AST 12*  ALT 12  ALKPHOS 50  BILITOT 0.3  PROT 5.8*  ALBUMIN 2.7*   No results for input(s): LIPASE, AMYLASE in the last 168 hours. No results for input(s): AMMONIA in the last 168 hours. Cardiac Enzymes: No results for input(s): CKTOTAL, CKMB, CKMBINDEX, TROPONINI in the last 168 hours. BNP (last 3 results) No results for input(s): BNP in the last 8760 hours. CBG: Recent Labs  Lab 05/08/20 0828 05/08/20 1228 05/08/20 1337 05/08/20 2146 05/09/20 0724  GLUCAP 112* 64* 77 92 88    Time spent: 35 minutes  Signed:  Berle Mull  [facility] 05/09/2020  7:51 AM

## 2020-06-07 DIAGNOSIS — E7800 Pure hypercholesterolemia, unspecified: Secondary | ICD-10-CM | POA: Diagnosis not present

## 2020-06-07 DIAGNOSIS — H269 Unspecified cataract: Secondary | ICD-10-CM | POA: Diagnosis not present

## 2020-06-07 DIAGNOSIS — F324 Major depressive disorder, single episode, in partial remission: Secondary | ICD-10-CM | POA: Diagnosis not present

## 2020-06-07 DIAGNOSIS — E119 Type 2 diabetes mellitus without complications: Secondary | ICD-10-CM | POA: Diagnosis not present

## 2020-06-07 DIAGNOSIS — I10 Essential (primary) hypertension: Secondary | ICD-10-CM | POA: Diagnosis not present

## 2020-06-07 DIAGNOSIS — G43001 Migraine without aura, not intractable, with status migrainosus: Secondary | ICD-10-CM | POA: Diagnosis not present

## 2020-06-07 DIAGNOSIS — M150 Primary generalized (osteo)arthritis: Secondary | ICD-10-CM | POA: Diagnosis not present

## 2020-06-07 DIAGNOSIS — M1712 Unilateral primary osteoarthritis, left knee: Secondary | ICD-10-CM | POA: Diagnosis not present

## 2020-06-08 DIAGNOSIS — E7800 Pure hypercholesterolemia, unspecified: Secondary | ICD-10-CM | POA: Diagnosis not present

## 2020-06-08 DIAGNOSIS — R1084 Generalized abdominal pain: Secondary | ICD-10-CM | POA: Diagnosis not present

## 2020-06-08 DIAGNOSIS — F419 Anxiety disorder, unspecified: Secondary | ICD-10-CM | POA: Diagnosis not present

## 2020-06-08 DIAGNOSIS — K5190 Ulcerative colitis, unspecified, without complications: Secondary | ICD-10-CM | POA: Diagnosis not present

## 2020-06-08 DIAGNOSIS — F324 Major depressive disorder, single episode, in partial remission: Secondary | ICD-10-CM | POA: Diagnosis not present

## 2020-06-08 DIAGNOSIS — I10 Essential (primary) hypertension: Secondary | ICD-10-CM | POA: Diagnosis not present

## 2020-06-08 DIAGNOSIS — E119 Type 2 diabetes mellitus without complications: Secondary | ICD-10-CM | POA: Diagnosis not present

## 2020-06-08 DIAGNOSIS — M1712 Unilateral primary osteoarthritis, left knee: Secondary | ICD-10-CM | POA: Diagnosis not present

## 2020-06-08 DIAGNOSIS — G8929 Other chronic pain: Secondary | ICD-10-CM | POA: Diagnosis not present

## 2020-07-17 DIAGNOSIS — F324 Major depressive disorder, single episode, in partial remission: Secondary | ICD-10-CM | POA: Diagnosis not present

## 2020-07-17 DIAGNOSIS — E7800 Pure hypercholesterolemia, unspecified: Secondary | ICD-10-CM | POA: Diagnosis not present

## 2020-07-17 DIAGNOSIS — I10 Essential (primary) hypertension: Secondary | ICD-10-CM | POA: Diagnosis not present

## 2020-07-17 DIAGNOSIS — E119 Type 2 diabetes mellitus without complications: Secondary | ICD-10-CM | POA: Diagnosis not present

## 2020-08-01 DIAGNOSIS — H40013 Open angle with borderline findings, low risk, bilateral: Secondary | ICD-10-CM | POA: Diagnosis not present

## 2020-08-01 DIAGNOSIS — I10 Essential (primary) hypertension: Secondary | ICD-10-CM | POA: Diagnosis not present

## 2020-08-01 DIAGNOSIS — H43812 Vitreous degeneration, left eye: Secondary | ICD-10-CM | POA: Diagnosis not present

## 2020-08-01 DIAGNOSIS — E7800 Pure hypercholesterolemia, unspecified: Secondary | ICD-10-CM | POA: Diagnosis not present

## 2020-08-01 DIAGNOSIS — R519 Headache, unspecified: Secondary | ICD-10-CM | POA: Diagnosis not present

## 2020-08-01 DIAGNOSIS — F324 Major depressive disorder, single episode, in partial remission: Secondary | ICD-10-CM | POA: Diagnosis not present

## 2020-08-01 DIAGNOSIS — H2513 Age-related nuclear cataract, bilateral: Secondary | ICD-10-CM | POA: Diagnosis not present

## 2020-08-01 DIAGNOSIS — E119 Type 2 diabetes mellitus without complications: Secondary | ICD-10-CM | POA: Diagnosis not present

## 2020-08-01 DIAGNOSIS — H5712 Ocular pain, left eye: Secondary | ICD-10-CM | POA: Diagnosis not present

## 2020-09-11 DIAGNOSIS — G43001 Migraine without aura, not intractable, with status migrainosus: Secondary | ICD-10-CM | POA: Diagnosis not present

## 2020-09-11 DIAGNOSIS — M150 Primary generalized (osteo)arthritis: Secondary | ICD-10-CM | POA: Diagnosis not present

## 2020-09-11 DIAGNOSIS — H269 Unspecified cataract: Secondary | ICD-10-CM | POA: Diagnosis not present

## 2020-09-11 DIAGNOSIS — E119 Type 2 diabetes mellitus without complications: Secondary | ICD-10-CM | POA: Diagnosis not present

## 2020-09-11 DIAGNOSIS — I10 Essential (primary) hypertension: Secondary | ICD-10-CM | POA: Diagnosis not present

## 2020-09-11 DIAGNOSIS — G8929 Other chronic pain: Secondary | ICD-10-CM | POA: Diagnosis not present

## 2020-09-11 DIAGNOSIS — F324 Major depressive disorder, single episode, in partial remission: Secondary | ICD-10-CM | POA: Diagnosis not present

## 2020-09-11 DIAGNOSIS — M1712 Unilateral primary osteoarthritis, left knee: Secondary | ICD-10-CM | POA: Diagnosis not present

## 2020-09-11 DIAGNOSIS — E7800 Pure hypercholesterolemia, unspecified: Secondary | ICD-10-CM | POA: Diagnosis not present

## 2020-09-25 DIAGNOSIS — G43001 Migraine without aura, not intractable, with status migrainosus: Secondary | ICD-10-CM | POA: Diagnosis not present

## 2020-09-25 DIAGNOSIS — E7800 Pure hypercholesterolemia, unspecified: Secondary | ICD-10-CM | POA: Diagnosis not present

## 2020-09-25 DIAGNOSIS — I10 Essential (primary) hypertension: Secondary | ICD-10-CM | POA: Diagnosis not present

## 2020-09-25 DIAGNOSIS — M150 Primary generalized (osteo)arthritis: Secondary | ICD-10-CM | POA: Diagnosis not present

## 2020-09-25 DIAGNOSIS — E1169 Type 2 diabetes mellitus with other specified complication: Secondary | ICD-10-CM | POA: Diagnosis not present

## 2020-09-25 DIAGNOSIS — H269 Unspecified cataract: Secondary | ICD-10-CM | POA: Diagnosis not present

## 2020-09-25 DIAGNOSIS — G8929 Other chronic pain: Secondary | ICD-10-CM | POA: Diagnosis not present

## 2020-09-25 DIAGNOSIS — F324 Major depressive disorder, single episode, in partial remission: Secondary | ICD-10-CM | POA: Diagnosis not present

## 2020-10-12 ENCOUNTER — Institutional Professional Consult (permissible substitution): Payer: Medicare Other | Admitting: Neurology

## 2020-10-24 DIAGNOSIS — I10 Essential (primary) hypertension: Secondary | ICD-10-CM | POA: Diagnosis not present

## 2020-10-24 DIAGNOSIS — F419 Anxiety disorder, unspecified: Secondary | ICD-10-CM | POA: Diagnosis not present

## 2020-10-24 DIAGNOSIS — R600 Localized edema: Secondary | ICD-10-CM | POA: Diagnosis not present

## 2020-10-24 DIAGNOSIS — F324 Major depressive disorder, single episode, in partial remission: Secondary | ICD-10-CM | POA: Diagnosis not present

## 2020-10-24 DIAGNOSIS — R110 Nausea: Secondary | ICD-10-CM | POA: Diagnosis not present

## 2020-10-24 DIAGNOSIS — E1169 Type 2 diabetes mellitus with other specified complication: Secondary | ICD-10-CM | POA: Diagnosis not present

## 2020-10-24 DIAGNOSIS — H60331 Swimmer's ear, right ear: Secondary | ICD-10-CM | POA: Diagnosis not present

## 2020-10-24 DIAGNOSIS — E7800 Pure hypercholesterolemia, unspecified: Secondary | ICD-10-CM | POA: Diagnosis not present

## 2020-10-27 DIAGNOSIS — E1169 Type 2 diabetes mellitus with other specified complication: Secondary | ICD-10-CM | POA: Diagnosis not present

## 2020-10-27 DIAGNOSIS — I10 Essential (primary) hypertension: Secondary | ICD-10-CM | POA: Diagnosis not present

## 2020-10-27 DIAGNOSIS — E7800 Pure hypercholesterolemia, unspecified: Secondary | ICD-10-CM | POA: Diagnosis not present

## 2020-10-27 DIAGNOSIS — M1712 Unilateral primary osteoarthritis, left knee: Secondary | ICD-10-CM | POA: Diagnosis not present

## 2020-10-27 DIAGNOSIS — F324 Major depressive disorder, single episode, in partial remission: Secondary | ICD-10-CM | POA: Diagnosis not present

## 2020-10-27 DIAGNOSIS — M150 Primary generalized (osteo)arthritis: Secondary | ICD-10-CM | POA: Diagnosis not present

## 2020-10-27 DIAGNOSIS — G8929 Other chronic pain: Secondary | ICD-10-CM | POA: Diagnosis not present

## 2020-10-27 DIAGNOSIS — H269 Unspecified cataract: Secondary | ICD-10-CM | POA: Diagnosis not present

## 2020-10-27 DIAGNOSIS — G43001 Migraine without aura, not intractable, with status migrainosus: Secondary | ICD-10-CM | POA: Diagnosis not present

## 2020-11-17 DIAGNOSIS — M25562 Pain in left knee: Secondary | ICD-10-CM | POA: Diagnosis not present

## 2020-11-17 DIAGNOSIS — M25561 Pain in right knee: Secondary | ICD-10-CM | POA: Diagnosis not present

## 2020-11-17 DIAGNOSIS — M170 Bilateral primary osteoarthritis of knee: Secondary | ICD-10-CM | POA: Diagnosis not present

## 2020-11-25 ENCOUNTER — Encounter (HOSPITAL_BASED_OUTPATIENT_CLINIC_OR_DEPARTMENT_OTHER): Payer: Self-pay | Admitting: Emergency Medicine

## 2020-11-25 ENCOUNTER — Emergency Department (HOSPITAL_BASED_OUTPATIENT_CLINIC_OR_DEPARTMENT_OTHER): Payer: Medicare HMO

## 2020-11-25 ENCOUNTER — Ambulatory Visit
Admission: EM | Admit: 2020-11-25 | Discharge: 2020-11-25 | Disposition: A | Discharge status: Home / Self Care | Payer: Medicare HMO

## 2020-11-25 ENCOUNTER — Emergency Department (HOSPITAL_BASED_OUTPATIENT_CLINIC_OR_DEPARTMENT_OTHER)
Admission: EM | Admit: 2020-11-25 | Discharge: 2020-11-25 | Disposition: A | Discharge status: Home / Self Care | Payer: Medicare HMO | Attending: Emergency Medicine | Admitting: Emergency Medicine

## 2020-11-25 ENCOUNTER — Other Ambulatory Visit: Payer: Self-pay

## 2020-11-25 DIAGNOSIS — R1084 Generalized abdominal pain: Secondary | ICD-10-CM

## 2020-11-25 DIAGNOSIS — R1030 Lower abdominal pain, unspecified: Secondary | ICD-10-CM | POA: Diagnosis not present

## 2020-11-25 DIAGNOSIS — M25562 Pain in left knee: Secondary | ICD-10-CM | POA: Diagnosis not present

## 2020-11-25 DIAGNOSIS — R197 Diarrhea, unspecified: Secondary | ICD-10-CM

## 2020-11-25 DIAGNOSIS — E119 Type 2 diabetes mellitus without complications: Secondary | ICD-10-CM | POA: Insufficient documentation

## 2020-11-25 DIAGNOSIS — K838 Other specified diseases of biliary tract: Secondary | ICD-10-CM

## 2020-11-25 DIAGNOSIS — M25561 Pain in right knee: Secondary | ICD-10-CM | POA: Insufficient documentation

## 2020-11-25 DIAGNOSIS — I10 Essential (primary) hypertension: Secondary | ICD-10-CM | POA: Insufficient documentation

## 2020-11-25 DIAGNOSIS — G8929 Other chronic pain: Secondary | ICD-10-CM | POA: Insufficient documentation

## 2020-11-25 DIAGNOSIS — Z91040 Latex allergy status: Secondary | ICD-10-CM | POA: Diagnosis not present

## 2020-11-25 DIAGNOSIS — K839 Disease of biliary tract, unspecified: Secondary | ICD-10-CM | POA: Insufficient documentation

## 2020-11-25 DIAGNOSIS — Z79899 Other long term (current) drug therapy: Secondary | ICD-10-CM | POA: Diagnosis not present

## 2020-11-25 DIAGNOSIS — K219 Gastro-esophageal reflux disease without esophagitis: Secondary | ICD-10-CM | POA: Insufficient documentation

## 2020-11-25 DIAGNOSIS — M545 Low back pain, unspecified: Secondary | ICD-10-CM | POA: Diagnosis not present

## 2020-11-25 DIAGNOSIS — R109 Unspecified abdominal pain: Secondary | ICD-10-CM | POA: Diagnosis not present

## 2020-11-25 LAB — CBC WITH DIFFERENTIAL/PLATELET
Abs Immature Granulocytes: 0.01 10*3/uL (ref 0.00–0.07)
Basophils Absolute: 0 10*3/uL (ref 0.0–0.1)
Basophils Relative: 0 %
Eosinophils Absolute: 0 10*3/uL (ref 0.0–0.5)
Eosinophils Relative: 1 %
HCT: 39.1 % (ref 36.0–46.0)
Hemoglobin: 12.2 g/dL (ref 12.0–15.0)
Immature Granulocytes: 0 %
Lymphocytes Relative: 54 %
Lymphs Abs: 3.1 10*3/uL (ref 0.7–4.0)
MCH: 25.8 pg — ABNORMAL LOW (ref 26.0–34.0)
MCHC: 31.2 g/dL (ref 30.0–36.0)
MCV: 82.8 fL (ref 80.0–100.0)
Monocytes Absolute: 0.6 10*3/uL (ref 0.1–1.0)
Monocytes Relative: 10 %
Neutro Abs: 2 10*3/uL (ref 1.7–7.7)
Neutrophils Relative %: 35 %
Platelets: 333 10*3/uL (ref 150–400)
RBC: 4.72 MIL/uL (ref 3.87–5.11)
RDW: 15.1 % (ref 11.5–15.5)
WBC: 5.8 10*3/uL (ref 4.0–10.5)
nRBC: 0 % (ref 0.0–0.2)

## 2020-11-25 LAB — COMPREHENSIVE METABOLIC PANEL
ALT: 14 U/L (ref 0–44)
AST: 16 U/L (ref 15–41)
Albumin: 3.5 g/dL (ref 3.5–5.0)
Alkaline Phosphatase: 50 U/L (ref 38–126)
Anion gap: 11 (ref 5–15)
BUN: 15 mg/dL (ref 8–23)
CO2: 29 mmol/L (ref 22–32)
Calcium: 9 mg/dL (ref 8.9–10.3)
Chloride: 96 mmol/L — ABNORMAL LOW (ref 98–111)
Creatinine, Ser: 0.62 mg/dL (ref 0.44–1.00)
GFR, Estimated: >60 mL/min (ref >60)
Glucose, Bld: 98 mg/dL (ref 70–99)
Potassium: 3.1 mmol/L — ABNORMAL LOW (ref 3.5–5.1)
Sodium: 136 mmol/L (ref 135–145)
Total Bilirubin: 0.1 mg/dL — ABNORMAL LOW (ref 0.3–1.2)
Total Protein: 7.9 g/dL (ref 6.5–8.1)

## 2020-11-25 LAB — URINALYSIS, ROUTINE W REFLEX MICROSCOPIC
Bilirubin Urine: NEGATIVE
Glucose, UA: NEGATIVE mg/dL
Hgb urine dipstick: NEGATIVE
Ketones, ur: NEGATIVE mg/dL
Leukocytes,Ua: NEGATIVE
Nitrite: NEGATIVE
Protein, ur: NEGATIVE mg/dL
Specific Gravity, Urine: 1.005 (ref 1.005–1.030)
pH: 8 (ref 5.0–8.0)

## 2020-11-25 LAB — LIPASE, BLOOD: Lipase: 26 U/L (ref 11–51)

## 2020-11-25 MED ORDER — ONDANSETRON HCL 4 MG/2ML IJ SOLN
4.0000 mg | Freq: Once | INTRAMUSCULAR | Status: AC
  Administered 2020-11-25: 4 mg via INTRAVENOUS
  Filled 2020-11-25: qty 2

## 2020-11-25 MED ORDER — MORPHINE SULFATE (PF) 4 MG/ML IV SOLN
4.0000 mg | Freq: Once | INTRAVENOUS | Status: AC
  Administered 2020-11-25: 4 mg via INTRAVENOUS
  Filled 2020-11-25: qty 1

## 2020-11-25 MED ORDER — IOHEXOL 300 MG/ML  SOLN
100.0000 mL | Freq: Once | INTRAMUSCULAR | Status: AC | PRN
  Administered 2020-11-25: 100 mL via INTRAVENOUS

## 2020-11-25 MED ORDER — POTASSIUM CHLORIDE CRYS ER 20 MEQ PO TBCR
40.0000 meq | EXTENDED_RELEASE_TABLET | Freq: Once | ORAL | Status: AC
  Administered 2020-11-25: 40 meq via ORAL
  Filled 2020-11-25: qty 2

## 2020-11-25 MED ORDER — OXYCODONE HCL 5 MG PO TABS
5.0000 mg | ORAL_TABLET | Freq: Once | ORAL | Status: AC
  Administered 2020-11-25: 5 mg via ORAL
  Filled 2020-11-25: qty 1

## 2020-11-25 MED ORDER — SODIUM CHLORIDE 0.9 % IV BOLUS
1000.0000 mL | Freq: Once | INTRAVENOUS | Status: AC
  Administered 2020-11-25: 1000 mL via INTRAVENOUS

## 2020-11-25 NOTE — ED Notes (Addendum)
Attempted IV x 2; unable to obtain 

## 2020-11-25 NOTE — ED Notes (Signed)
Pt amb to BR with walker

## 2020-11-25 NOTE — ED Provider Notes (Signed)
[name] EMERGENCY DEPARTMENT Provider Note   CSN: [id number] Arrival date & time: 11/25/20  1200     History Chief Complaint  Patient presents with  . Abdominal Pain    Erin Wolf is a 65 y.o. female history of obesity, diabetes, fibromyalgia, GERD, IBS, hyperlipidemia, ulcerative colitis, seizures.  Patient presents today for abdominal pain onset yesterday described as a severe constant lower abdominal pain cramping nonradiating no clear aggravating or alleviating factors associated with multiple episodes of nonbloody diarrhea as well as nausea with one episode of nonbloody emesis today.  Patient reports that she follows with [facility] gastroenterology.  Additionally patient reports chronic bilateral knee pain and reports she is scheduled for arthroscopies next month with her orthopedist.  Denies any change to her knee pain redness swelling color change falls or injuries  Denies fever/chills, headache, chest pain/shortness of breath, cough, numbness/tingling, weakness, dysuria/hematuria, vaginal bleeding/discharge or any additional concerns.  HPI     Past Medical History:  Diagnosis Date  . Anxiety   . Arthritis   . Chronic abdominal pain   . Chronic lower back pain   . Chronic nausea   . Depression   . Diabetes mellitus without complication ([place])   . Diverticulosis   . Fibromyalgia   . Frequency of urination   . GERD (gastroesophageal reflux disease)   . Headache   . Hematuria   . Hepatitis C antibody positive in blood    per pt told by pcp  . History of panic attacks   . History of syncope    hx recurrent syncope --- per epic domentation non-cardiac , orthostatic hypotension, anxiety, dehydration  . History of uterine fibroid   . HTN (hypertension), benign   . Hyperlipidemia 01/03/2012  . IBS (irritable bowel syndrome)   . Myalgia   . Seizure disorder ([place]) followed by pcp until new neurologist appr. in jan 2019   "started having them in my 20's" ,   petit mal ----  per pt last seizure one 2015  . SLE (systemic lupus erythematosus) [facility])    rheumatologist-- dr Gerilyn Nestle (consult 11-12-2016) having work-up done  . Ulcerative colitis    followed by dr Penelope Coop at [employer]  . Wears glasses     Patient Active Problem List   Diagnosis Date Noted  . Chronic abdominal pain 05/05/2020  . Chronic pain of left knee   . Chronic migraine 11/10/2018  . Nausea & vomiting 10/18/2016  . History of ulcerative colitis   . LLQ abdominal pain 10/17/2016  . Syncope and collapse 06/29/2016  . Left-sided weakness 06/29/2016  . Facial twitching 06/29/2016  . IBS (irritable bowel syndrome) 06/29/2016  . Faintness   . Dehydration 05/09/2014  . GERD (gastroesophageal reflux disease) 05/09/2014  . Gastroenteritis 05/09/2014  . Chest pain, atypical 05/09/2014  . Chronic ulcerative colitis ([place]) 05/09/2014  . Acute gastroenteritis 05/09/2014  . Ulcerative colitis ([place]) 05/27/2013  . Hypokalemia 05/27/2013  . Anemia 01/04/2012  . Transaminitis 01/04/2012  . Diarrhea 01/04/2012  . Chest pain 01/03/2012  . SOB (shortness of breath) 01/03/2012  . HTN (hypertension), benign 01/03/2012  . Hx of migraines 01/03/2012  . Seizure ([place]) 01/03/2012  . Depression 01/03/2012  . Ulcerative colitis 01/03/2012  . Chronic back pain 01/03/2012  . 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 Hyperlipidemia 01/03/2012  . Hepatitis C 01/03/2012  . Fibroids 01/03/2012    Past Surgical History:  Procedure Laterality Date  . ABDOMINAL HYSTERECTOMY  1988  . CHOLECYSTECTOMY OPEN  1978  .  COLONOSCOPY N/A 05/31/2013   Procedure: COLONOSCOPY;  Surgeon: Wonda Horner, MD;  Location: [facility] ENDOSCOPY;  Service: Endoscopy;  Laterality: N/A;  . CYSTOSCOPY/RETROGRADE/URETEROSCOPY Bilateral 07/23/2017   Procedure: CYSTOSCOPY/RETROGRADE/URETEROSCOPY;  Surgeon: Ceasar Mons, MD;  Location: [facility];  Service: Urology;  Laterality: Bilateral;  .  ESOPHAGOGASTRODUODENOSCOPY (EGD) WITH PROPOFOL N/A 05/11/2014   Procedure: ESOPHAGOGASTRODUODENOSCOPY (EGD) WITH PROPOFOL;  Surgeon: Lear Ng, MD;  Location: [facility] ENDOSCOPY;  Service: Endoscopy;  Laterality: N/A;  egd first  . ESOPHAGOGASTRODUODENOSCOPY (EGD) WITH PROPOFOL N/A 05/08/2020   Procedure: ESOPHAGOGASTRODUODENOSCOPY (EGD) WITH PROPOFOL;  Surgeon: Arta Silence, MD;  Location: [facility] ENDOSCOPY;  Service: Endoscopy;  Laterality: N/A;  . EUS N/A 06/21/2016   Procedure: ESOPHAGEAL ENDOSCOPIC ULTRASOUND (EUS) RADIAL;  Surgeon: Arta Silence, MD;  Location: [facility] ENDOSCOPY;  Service: Endoscopy;  Laterality: N/A;  . FLEXIBLE SIGMOIDOSCOPY N/A 05/11/2014   Procedure: FLEXIBLE SIGMOIDOSCOPY;  Surgeon: Lear Ng, MD;  Location: [facility] ENDOSCOPY;  Service: Endoscopy;  Laterality: N/A;  . LAPAROTOMY W/ BILATERAL SALPINGOOPHORECTOMY  09-26-2003   dr Matthew Saras at [facility]  . LAPROSCOPY W/ LYSIS ADHESIONS  06-08-2003   dr Radene [facility]  . TRANSTHORACIC ECHOCARDIOGRAM  04/29/2016   ef 60-65%,  grade 2 diastolic dysfunction/  trivial MR and TR  . TUBAL LIGATION Bilateral yrs ago  . UPPER ESOPHAGEAL ENDOSCOPIC ULTRASOUND (EUS) N/A 05/08/2020   Procedure: UPPER ESOPHAGEAL ENDOSCOPIC ULTRASOUND (EUS);  Surgeon: Arta Silence, MD;  Location: [facility] ENDOS[facility];  Service: Endoscopy;  Laterality: N/A;     OB History   No obstetric history on file.     Family History  Problem Relation Age of Onset  . Alzheimer's disease Mother   . Hypertension Mother   . Colon cancer Brother   . Hypertension Brother   . Other Father        ruptured appendix  . Breast cancer Sister   . Hypertension Sister   . Hypertension Sister   . Memory loss Brother   . Hypertension Sister   . Heart disease Sister     Social History   Tobacco Use  . Smoking status: Never Smoker  . Smokeless tobacco: Never Used  Vaping Use  . Vaping Use: Never used  Substance Use Topics  . Alcohol use: Yes    Comment: occ  . Drug use: No     Home Medications Prior to Admission medications   Medication Sig Start Date End Date Taking? Authorizing Provider  cyclobenzaprine (FLEXERIL) 10 MG tablet Take 10 mg by mouth as needed for muscle spasms. 03/30/20   [provider]  diazepam (VALIUM) 5 MG tablet Take 5 mg by mouth 2 (two) times daily as needed for anxiety.  05/14/19   [provider]  docusate sodium (COLACE) 100 MG capsule Take 1 capsule (100 mg total) by mouth 2 (two) times daily. 05/09/20 05/09/21  Lavina Hamman, MD  esomeprazole (NEXIUM) 40 MG capsule Take 40 mg by mouth daily before breakfast. 06/10/19   [provider]  FLUoxetine (PROZAC) 20 MG capsule Take 20 mg by mouth daily.     [provider]  hyoscyamine (ANASPAZ) 0.125 MG TBDP disintergrating tablet Take 2 tablets (0.25 mg total) by mouth every 6 (six) hours as needed for cramping (abdominal pain). 05/09/20   Lavina Hamman, MD  Multiple Vitamins-Minerals (WOMENS MULTI PO) Take 1 tablet by mouth daily.    [provider]  Oxycodone HCl 10 MG TABS Take 10 mg by mouth 3 (three) times daily as needed  for pain. 05/21/17   [provider]  Polyethyl Glycol-Propyl Glycol (SYSTANE OP) Place 1 drop into both eyes 3 (three) times daily as needed (dry eyes.).    [provider]  polyethylene glycol (MIRALAX) 17 g packet Take 17 g by mouth daily. 05/09/20   Lavina Hamman, MD  triamterene-hydrochlorothiazide (MAXZIDE) 75-50 MG tablet Take 1 tablet by mouth every morning. 03/30/20   [provider]    Allergies    Latex, Nsaids, Penicillins, Wellbutrin [bupropion hcl], Aspirin, Darvocet [propoxyphene n-acetaminophen], Isometheptene-dichloral-apap, Tape, Toradol [ketorolac tromethamine], Tylenol [acetaminophen], Milk-related compounds, and Other  Review of Systems   Review of Systems Ten systems are reviewed and are negative for acute change except as noted in the HPI  Physical Exam Updated Vital  Signs BP 118/75 (BP Location: Right Arm)   Pulse 80   Temp 98.4 F (36.9 C) (Oral)   Resp 18   Ht 5\' 5"  (1.651 m)   Wt 99.8 kg   SpO2 100%   BMI 36.61 kg/m   Physical Exam Constitutional:      General: She is not in acute distress.    Appearance: Normal appearance. She is well-developed. She is not ill-appearing or diaphoretic.  HENT:     Head: Normocephalic and atraumatic.  Eyes:     General: Vision grossly intact. Gaze aligned appropriately.     Pupils: Pupils are equal, round, and reactive to light.  Neck:     Trachea: Trachea and phonation normal.  Pulmonary:     Effort: Pulmonary effort is normal. No respiratory distress.  Abdominal:     General: There is no distension.     Palpations: Abdomen is soft.     Tenderness: There is generalized abdominal tenderness. There is guarding. There is no rebound. Negative signs include McBurney's sign.  Musculoskeletal:        General: Normal range of motion.     Cervical back: Normal range of motion.     Right lower leg: No edema.     Left lower leg: No edema.     Comments: Normal-appearing bilateral knees without evidence of infection.  Skin:    General: Skin is warm and dry.  Neurological:     Mental Status: She is alert.     GCS: GCS eye subscore is 4. GCS verbal subscore is 5. GCS motor subscore is 6.     Comments: Speech is clear and goal oriented, follows commands Major Cranial nerves without deficit, no facial droop Moves extremities without ataxia, coordination intact  Psychiatric:        Behavior: Behavior normal.     ED Results / Procedures / Treatments   Labs (all labs ordered are listed, but only abnormal results are displayed) Labs Reviewed  CBC WITH DIFFERENTIAL/PLATELET - Abnormal; Notable for the following components:      Result Value   MCH 25.8 (*)    All other components within normal limits  COMPREHENSIVE METABOLIC PANEL - Abnormal; Notable for the following components:   Potassium 3.1 (*)     Chloride 96 (*)    Total Bilirubin 0.1 (*)    All other components within normal limits  LIPASE, BLOOD  URINALYSIS, ROUTINE W REFLEX MICROSCOPIC    EKG None  Radiology CT ABDOMEN PELVIS W CONTRAST  Result Date: 11/25/2020 CLINICAL DATA:  Lower abdominal pain and right flank pain with nausea, history of colitis. EXAM: CT ABDOMEN AND PELVIS WITH CONTRAST TECHNIQUE: Multidetector CT imaging of the abdomen and pelvis was performed  using the standard protocol following bolus administration of intravenous contrast. CONTRAST:  160mL OMNIPAQUE IOHEXOL 300 MG/ML  SOLN COMPARISON:  MRI abdomen May 05, 2020, CT abdomen pelvis May 05, 2020. FINDINGS: Lower chest: No acute abnormality. Normal size heart. No pericardial effusion. Small hiatal hernia. Hepatobiliary: No suspicious hepatic lesions. Cholecystectomy. Slightly increased dilation of the intra hepatic and extrahepatic biliary ducts in comparison to prior with the common duct measuring up to 9 mm previously 8 mm and unchanged smooth tapering to the level of the ampulla. Pancreas: Similar dilation of the main pancreatic duct measuring up to 6 mm. Similar prominent mass like area in the region of the ampullary now measuring 1.6 x 1.1 cm previously 1.8 x 1.5 cm (series 5, image 43, this was further evaluated by MRCP and ERCP with only a prominent papula being identified and no other acute abnormality. Similar mild distal pancreatic atrophy. Spleen: Normal in size without focal abnormality. Adrenals/Urinary Tract: Unchanged 1 cm left adrenal myelolipoma. Right adrenal glands unremarkable. No hydronephrosis. No suspicious renal masses. Bladder is unremarkable. Stomach/Bowel: Stomach is unremarkable. No suspicious small bowel wall thickening or dilation. The appendix is not definitely visualized however there is no inflammatory stranding in the right lower quadrant to suggest appendicitis. Colonic diverticulosis without findings of diverticulitis.  Vascular/Lymphatic: Aortic atherosclerosis. No enlarged abdominal or pelvic lymph nodes. Reproductive: Status post hysterectomy. No adnexal masses. Other: No abdominopelvic ascites. Sequela of subcutaneous injections in the abdominal wall. Musculoskeletal: Multilevel degenerative change of the spine. No acute osseous abnormality. IMPRESSION: 1. Colonic diverticulosis without findings of diverticulitis. 2. Slightly increased dilation of the intra hepatic and extrahepatic biliary ducts, with similar dilation of the main pancreatic duct and prominent mass like area in the region of the ampullary region which was further evaluation with MRCP and ERCP with only a prominent papula being identified and no other acute abnormality. Again findings which are nonspecific and may represent reservoir effect of the biliary tree status post cholecystectomy or sequela of a prominent papula at the ampulla. Recommend correlation with laboratory values for findings of biliary obstruction. If elevated laboratory values/findings suggestive of biliary obstruction MRCP/ERCP could once again be utilized for further evaluation 3. Unchanged 1 cm left adrenal myelolipoma. 4. Aortic atherosclerosis. Aortic Atherosclerosis (ICD10-I70.0). Electronically Signed   By: Dahlia Bailiff MD   On: 11/25/2020 15:52    Procedures Procedures   Medications Ordered in ED Medications  potassium chloride SA (KLOR-CON) CR tablet 40 mEq (has no administration in time range)  oxyCODONE (Oxy IR/ROXICODONE) immediate release tablet 5 mg (has no administration in time range)  sodium chloride 0.9 % bolus 1,000 mL ( Intravenous Stopped 11/25/20 1558)  morphine 4 MG/ML injection 4 mg (4 mg Intravenous Given 11/25/20 1434)  ondansetron (ZOFRAN) injection 4 mg (4 mg Intravenous Given 11/25/20 1439)  iohexol (OMNIPAQUE) 300 MG/ML solution 100 mL (100 mLs Intravenous Contrast Given 11/25/20 1507)    ED Course  I have reviewed the triage vital signs and the nursing  notes.  Pertinent labs & imaging results that were available during my care of the patient were reviewed by me and considered in my medical decision making (see chart for details).    MDM Rules/Calculators/A&P                         Additional history obtained from: 1. Nursing notes from this visit. 2. Review of electronic medical records. ----------------- I ordered, reviewed and interpreted labs which include: CBC shows  no leukocytosis to suggest bacterial infection, no anemia. CMP shows mild hypokalemia 3.1, no emergent electrolyte derangement, AKI, LFT elevations or gap. Urinalysis within normal limits, no evidence for infection no hemoglobin to suggest kidney stone disease. Lipase within normal limits, doubt pancreatitis.  CTAP:  IMPRESSION:  1. Colonic diverticulosis without findings of diverticulitis.  2. Slightly increased dilation of the intra hepatic and extrahepatic  biliary ducts, with similar dilation of the main pancreatic duct and  prominent mass like area in the region of the ampullary region which  was further evaluation with MRCP and ERCP with only a prominent  papula being identified and no other acute abnormality. Again  findings which are nonspecific and may represent reservoir effect of  the biliary tree status post cholecystectomy or sequela of a  prominent papula at the ampulla. Recommend correlation with  laboratory values for findings of biliary obstruction. If elevated  laboratory values/findings suggestive of biliary obstruction  MRCP/ERCP could once again be utilized for further evaluation  3. Unchanged 1 cm left adrenal myelolipoma.  4. Aortic atherosclerosis.    Aortic Atherosclerosis (ICD10-I70.0).   PMD P reviewed, patient receives 180 doses of 10 mg oxycodone per month it appears that her refill is due within the next day.  Patient aware that she would not receive refill through the emergency department.  She was given 1 oxycodone prior to  discharge.  On reevaluation she reports she was feeling better she is resting comfortably bed no acute distress vital signs stable.  Patient was made aware of her findings above and she states understanding I encourage patient to call her gastroenterologist tomorrow morning to schedule a follow-up appointment for reevaluation and repeat LFTs.  Suspect patient symptoms today may be secondary to her UC versus viral gastroenteritis versus possible out of pain medication, there is not appear to be any emergent process at this time, there is no indication for emergent MRCP or ERCP.  Patient being discharged and her husband is driving her home.  At this time there does not appear to be any evidence of an acute emergency medical condition and the patient appears stable for discharge with appropriate outpatient follow up. Diagnosis was discussed with patient who verbalizes understanding of care plan and is agreeable to discharge. I have discussed return precautions with patient who verbalizes understanding. Patient encouraged to follow-up with their PCP. All questions answered.  Patient's case discussed with Dr. Ashok Cordia who agrees with plan to discharge with follow-up.   Note: Portions of this report may have been transcribed using voice recognition software. Every effort was made to ensure accuracy; however, inadvertent computerized transcription errors may still be present. Final Clinical Impression(s) / ED Diagnoses Final diagnoses:  Lower abdominal pain  Diarrhea, unspecified type  Dilation of biliary tract    Rx / DC Orders ED Discharge Orders    None       Gari Crown 11/25/20 1649    Lajean Saver, MD 11/25/20 [phone]

## 2020-11-25 NOTE — ED Provider Notes (Signed)
[name]    CSN: [id number] Arrival date & time: 11/25/20  1049      History   Chief Complaint Chief Complaint  Patient presents with  . Abdominal Pain    X 1 month  . Flank Pain    X 1 month    HPI Erin Wolf is a 65 y.o. female history of ulcerative colitis, DM type II, fibromyalgia, GERD, presenting today for evaluation of pain.  Patient reports bilateral knee pain, abdominal pain and back pain which has been progressively worsening over the past month.  She has plans to have knee arthroscopy on 3/1.  She reports her colitis has been flared up recently as well and reports increased abdominal pain and nausea.  Abdominal pain feels similar to prior colitis flares.  She also has had some lower back pain which is typical for her as well.  She does report one episode of urinary incontinence which was different from typical urge/stress incontinence.  She has oxycodone regularly prescribed to her, cannot take NSAIDs due to her colitis, reports that she has previously been on prednisone and has not been helping her pain as well.  Uses muscle relaxers for her back.  Reports in the past she has had improvement with pain given IV in the emergency room.  HPI  Past Medical History:  Diagnosis Date  . Anxiety   . Arthritis   . Chronic abdominal pain   . Chronic lower back pain   . Chronic nausea   . Depression   . Diabetes mellitus without complication ([place])   . Diverticulosis   . Fibromyalgia   . Frequency of urination   . GERD (gastroesophageal reflux disease)   . Headache   . Hematuria   . Hepatitis C antibody positive in blood    per pt told by pcp  . History of panic attacks   . History of syncope    hx recurrent syncope --- per epic domentation non-cardiac , orthostatic hypotension, anxiety, dehydration  . History of uterine fibroid   . HTN (hypertension), benign   . Hyperlipidemia 01/03/2012  . IBS (irritable bowel syndrome)   . Myalgia   . Seizure  disorder ([place]) followed by pcp until new neurologist appr. in jan 2019   "started having them in my 20's" ,  petit mal ----  per pt last seizure one 2015  . SLE (systemic lupus erythematosus) [facility])    rheumatologist-- dr Gerilyn Nestle (consult 11-12-2016) having work-up done  . Ulcerative colitis    followed by dr Penelope Coop at [place]  . Wears glasses     Patient Active Problem List   Diagnosis Date Noted  . Chronic abdominal pain 05/05/2020  . Chronic pain of left knee   . Chronic migraine 11/10/2018  . Nausea & vomiting 10/18/2016  . History of ulcerative colitis   . LLQ abdominal pain 10/17/2016  . Syncope and collapse 06/29/2016  . Left-sided weakness 06/29/2016  . Facial twitching 06/29/2016  . IBS (irritable bowel syndrome) 06/29/2016  . Faintness   . Dehydration 05/09/2014  . GERD (gastroesophageal reflux disease) 05/09/2014  . Gastroenteritis 05/09/2014  . Chest pain, atypical 05/09/2014  . Chronic ulcerative colitis ([place]) 05/09/2014  . Acute gastroenteritis 05/09/2014  . Ulcerative colitis ([place]) 05/27/2013  . Hypokalemia 05/27/2013  . Anemia 01/04/2012  . Transaminitis 01/04/2012  . Diarrhea 01/04/2012  . Chest pain 01/03/2012  . SOB (shortness of breath) 01/03/2012  . HTN (hypertension), benign 01/03/2012  . Hx of migraines 01/03/2012  .  Seizure ([place]) 01/03/2012  . Depression 01/03/2012  . Ulcerative colitis 01/03/2012  . Chronic back pain 01/03/2012  . 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 Hyperlipidemia 01/03/2012  . Hepatitis C 01/03/2012  . Fibroids 01/03/2012    Past Surgical History:  Procedure Laterality Date  . ABDOMINAL HYSTERECTOMY  1988  . CHOLECYSTECTOMY OPEN  1978  . COLONOSCOPY N/A 05/31/2013   Procedure: COLONOSCOPY;  Surgeon: Wonda Horner, MD;  Location: [facility] ENDOSCOPY;  Service: Endoscopy;  Laterality: N/A;  . CYSTOSCOPY/RETROGRADE/URETEROSCOPY Bilateral 07/23/2017   Procedure: CYSTOSCOPY/RETROGRADE/URETEROSCOPY;  Surgeon: Ceasar Mons, MD;   Location: [facility];  Service: Urology;  Laterality: Bilateral;  . ESOPHAGOGASTRODUODENOSCOPY (EGD) WITH PROPOFOL N/A 05/11/2014   Procedure: ESOPHAGOGASTRODUODENOSCOPY (EGD) WITH PROPOFOL;  Surgeon: Lear Ng, MD;  Location: [facility] ENDOSCOPY;  Service: Endoscopy;  Laterality: N/A;  egd first  . ESOPHAGOGASTRODUODENOSCOPY (EGD) WITH PROPOFOL N/A 05/08/2020   Procedure: ESOPHAGOGASTRODUODENOSCOPY (EGD) WITH PROPOFOL;  Surgeon: Arta Silence, MD;  Location: [facility] ENDOSCOPY;  Service: Endoscopy;  Laterality: N/A;  . EUS N/A 06/21/2016   Procedure: ESOPHAGEAL ENDOSCOPIC ULTRASOUND (EUS) RADIAL;  Surgeon: Arta Silence, MD;  Location: [facility] ENDOSCOPY;  Service: Endoscopy;  Laterality: N/A;  . FLEXIBLE SIGMOIDOSCOPY N/A 05/11/2014   Procedure: FLEXIBLE SIGMOIDOSCOPY;  Surgeon: Lear Ng, MD;  Location: [facility] ENDOSCOPY;  Service: Endoscopy;  Laterality: N/A;  . LAPAROTOMY W/ BILATERAL SALPINGOOPHORECTOMY  09-26-2003   dr Matthew Saras at [facility]  . LAPROSCOPY W/ LYSIS ADHESIONS  06-08-2003   dr Radene [facility]  . TRANSTHORACIC ECHOCARDIOGRAM  04/29/2016   ef 60-65%,  grade 2 diastolic dysfunction/  trivial MR and TR  . TUBAL LIGATION Bilateral yrs ago  . UPPER ESOPHAGEAL ENDOSCOPIC ULTRASOUND (EUS) N/A 05/08/2020   Procedure: UPPER ESOPHAGEAL ENDOSCOPIC ULTRASOUND (EUS);  Surgeon: Arta Silence, MD;  Location: [facility];  Service: Endoscopy;  Laterality: N/A;    OB History   No obstetric history on file.      Home Medications    Prior to Admission medications   Medication Sig Start Date End Date Taking? Authorizing Provider  cyclobenzaprine (FLEXERIL) 10 MG tablet Take 10 mg by mouth as needed for muscle spasms. 03/30/20   [provider]  diazepam (VALIUM) 5 MG tablet Take 5 mg by mouth 2 (two) times daily as needed for anxiety.  05/14/19   [provider]  docusate sodium (COLACE) 100 MG capsule Take 1 capsule (100 mg total) by mouth 2 (two) times daily. 05/09/20  05/09/21  Lavina Hamman, MD  esomeprazole (NEXIUM) 40 MG capsule Take 40 mg by mouth daily before breakfast. 06/10/19   [provider]  FLUoxetine (PROZAC) 20 MG capsule Take 20 mg by mouth daily.     [provider]  hyoscyamine (ANASPAZ) 0.125 MG TBDP disintergrating tablet Take 2 tablets (0.25 mg total) by mouth every 6 (six) hours as needed for cramping (abdominal pain). 05/09/20   Lavina Hamman, MD  Multiple Vitamins-Minerals (WOMENS MULTI PO) Take 1 tablet by mouth daily.    [provider]  Oxycodone HCl 10 MG TABS Take 10 mg by mouth 3 (three) times daily as needed for pain. 05/21/17   [provider]  Polyethyl Glycol-Propyl Glycol (SYSTANE OP) Place 1 drop into both eyes 3 (three) times daily as needed (dry eyes.).    [provider]  polyethylene glycol (MIRALAX) 17 g packet Take 17 g by mouth daily. 05/09/20   Lavina Hamman, MD  triamterene-hydrochlorothiazide (MAXZIDE) 75-50 MG tablet Take 1 tablet by mouth  every morning. 03/30/20   [provider]    Family History Family History  Problem Relation Age of Onset  . Alzheimer's disease Mother   . Hypertension Mother   . Colon cancer Brother   . Hypertension Brother   . Other Father        ruptured appendix  . Breast cancer Sister   . Hypertension Sister   . Hypertension Sister   . Memory loss Brother   . Hypertension Sister   . Heart disease Sister     Social History Social History   Tobacco Use  . Smoking status: Never Smoker  . Smokeless tobacco: Never Used  Vaping Use  . Vaping Use: Never used  Substance Use Topics  . Alcohol use: Yes    Comment: occ  . Drug use: No     Allergies   Latex, Nsaids, Penicillins, Wellbutrin [bupropion hcl], Aspirin, Darvocet [propoxyphene n-acetaminophen], Isometheptene-dichloral-apap, Tape, Toradol [ketorolac tromethamine], Tylenol [acetaminophen], Milk-related compounds, and Other   Review of Systems Review of Systems   Constitutional: Negative for activity change, appetite change, chills, fatigue and fever.  HENT: Negative for congestion, ear pain, rhinorrhea, sinus pressure, sore throat and trouble swallowing.   Eyes: Negative for discharge and redness.  Respiratory: Negative for cough, chest tightness and shortness of breath.   Cardiovascular: Negative for chest pain.  Gastrointestinal: Negative for abdominal pain, diarrhea, nausea and vomiting.  Musculoskeletal: Positive for arthralgias and myalgias.  Skin: Negative for rash.  Neurological: Negative for dizziness, light-headedness and headaches.     Physical Exam Triage Vital Signs ED Triage Vitals  Enc Vitals Group     BP      Pulse      Resp      Temp      Temp src      SpO2      Weight      Height      Head Circumference      Peak Flow      Pain Score      Pain Loc      Pain Edu?      Excl. in [place]?    No data found.  Updated Vital Signs BP 105/72 (BP Location: Right Arm)   Pulse (!) 110   Resp (!) 22   SpO2 97%   Visual Acuity Right Eye Distance:   Left Eye Distance:   Bilateral Distance:    Right Eye Near:   Left Eye Near:    Bilateral Near:     Physical Exam Vitals and nursing note reviewed.  Constitutional:      Appearance: She is well-developed and well-nourished.     Comments: Tearful at times  HENT:     Head: Normocephalic and atraumatic.     Nose: Nose normal.  Eyes:     Conjunctiva/sclera: Conjunctivae normal.  Cardiovascular:     Rate and Rhythm: Normal rate.  Pulmonary:     Effort: Pulmonary effort is normal. No respiratory distress.     Comments: Breathing comfortably at rest, CTABL, no wheezing, rales or other adventitious sounds auscultated Abdominal:     General: There is no distension.     Comments: Soft, generalized abdominal pain throughout abdomen  Musculoskeletal:        General: Normal range of motion.     Cervical back: Neck supple.  Skin:    General: Skin is warm and dry.   Neurological:     Mental Status: She is alert and oriented to person,  place, and time.  Psychiatric:        Mood and Affect: Mood and affect normal.      UC Treatments / Results  Labs (all labs ordered are listed, but only abnormal results are displayed) Labs Reviewed - No data to display  EKG   Radiology No results found.  Procedures Procedures (including critical care time)  Medications Ordered in UC Medications - No data to display  Initial Impression / Assessment and Plan / UC Course  I have reviewed the triage vital signs and the nursing notes.  Pertinent labs & imaging results that were available during my care of the patient were reviewed by me and considered in my medical decision making (see chart for details).     Knee, abdominal pain and back pain chronic in nature, similar to pain previously felt, offered course of prednisone, patient declined, discussed other option as she has previously had relief with IV medicine in the emergency room.  Patient plans to follow-up in emergency room.  Patient did have new episode of urinary incontinence, but denies associated saddle anesthesia or worsening/change back pain from previous.  Denies bowel incontinence.  Patient stable on discharge and plans to go to emergency room with husband.   Final Clinical Impressions(s) / UC Diagnoses   Final diagnoses:  Generalized abdominal pain  Acute pain of both knees  Acute bilateral low back pain without sciatica   Discharge Instructions   None    ED Prescriptions    None     I have reviewed the PDMP during this encounter.   Janith Lima, [place] 11/25/20 1149

## 2020-11-25 NOTE — ED Triage Notes (Signed)
Patient states that she has had abdominal pain x 1 month and is scheduled for surgery on the 1st of March. Pt is aox4 but needs an wheelchair to ambulate.

## 2020-11-25 NOTE — ED Triage Notes (Signed)
Pt arrives pov with driver. C/o lower abdominal pain, right flank pain, endorses nausea, hx of colitis. Pt sent from UC. Pt to triage in wheelchair

## 2020-11-25 NOTE — Discharge Instructions (Addendum)
At this time there does not appear to be the presence of an emergent medical condition, however there is always the potential for conditions to change. Please read and follow the below instructions.  Please return to the Emergency Department immediately for any new or worsening symptoms. Please be sure to follow up with your Primary Care Provider within one week regarding your visit today; please call their office to schedule an appointment even if you are feeling better for a follow-up visit. As we discussed your biliary system on your CT scan today was slightly dilated you will need to have your liver function tests rechecked and discuss this finding with your gastroenterologist at your follow-up visit this week.  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 Your potassium level was slightly low in the emergency department today have your potassium level rechecked by your primary care provider or your gastroenterologist at your follow-up visit.  Go to the nearest Emergency Department immediately if: You have fever or chills You cannot stop vomiting. Your pain is only in areas of your belly, such as the right side or the left lower part of the belly. You have bloody or black poop, or poop that looks like tar. You have very bad pain, cramping, or bloating in your belly. You have signs of not having enough fluid or water in your body (dehydration), such as: Dark pee, very little pee, or no pee. Cracked lips. Dry mouth. Sunken eyes. Sleepiness. Weakness. You have trouble breathing or chest pain. You have any new/concerning or worsening of symptoms   Please read the additional information packets attached to your discharge summary.  Do not take your medicine if  develop an itchy rash, swelling in your mouth or lips,  or difficulty breathing; call 911 and seek immediate emergency medical attention if this occurs.  You may review your lab tests and imaging results in their entirety on your MyChart account.  Please discuss all results of fully with your primary care provider and other specialist at your follow-up visit.  Note: Portions of this text may have been transcribed using voice recognition software. Every effort was made to ensure accuracy; however, inadvertent computerized transcription errors may still be present.

## 2020-12-10 NOTE — H&P (Signed)
TOTAL KNEE ADMISSION H&P  Patient is being admitted for left total knee arthroplasty.  Subjective:  Chief Complaint: Left knee OA / pain  HPI: Erin Wolf, 65 y.o. female, has a history of pain and functional disability in the left knee due to arthritis and has failed non-surgical conservative treatments for greater than 12 weeks to include NSAID's and/or analgesics, corticosteriod injections, use of assistive devices and activity modification.  Onset of symptoms was gradual, starting 2+ years ago with gradually worsening course since that time. The patient noted no past surgery on the left knee(s).  Patient currently rates pain in the left knee(s) at 10 out of 10 with activity. Patient has night pain, worsening of pain with activity and weight bearing, pain that interferes with activities of daily living, pain with passive range of motion, crepitus and joint swelling.  Patient has evidence of periarticular osteophytes and joint space narrowing by imaging studies. There is no active infection.  Risks, benefits and expectations were discussed with the patient.  Risks including but not limited to the risk of anesthesia, blood clots, nerve damage, blood vessel damage, failure of the prosthesis, infection and up to and including death.  Patient understand the risks, benefits and expectations and wishes to proceed with surgery.   D/C Plans:       Home (obs)  Post-op Meds:       No Rx given  Tranexamic Acid:      To be given - IV   Decadron:      Is to be given  FYI:     Xarelto  Oxy (on pre-op)  DME:   Pt equipment aranged  PT:   OPPT @ [facility]  Ph[facility]: [facility]   Patient Active Problem List   Diagnosis Date Noted  . Chronic abdominal pain 05/05/2020  . Chronic pain of left knee   . Chronic migraine 11/10/2018  . Nausea & vomiting 10/18/2016  . History of ulcerative colitis   . LLQ abdominal pain 10/17/2016  . Syncope and collapse 06/29/2016  . Left-sided weakness 06/29/2016  .  Facial twitching 06/29/2016  . IBS (irritable bowel syndrome) 06/29/2016  . Faintness   . Dehydration 05/09/2014  . GERD (gastroesophageal reflux disease) 05/09/2014  . Gastroenteritis 05/09/2014  . Chest pain, atypical 05/09/2014  . Chronic ulcerative colitis ([place]) 05/09/2014  . Acute gastroenteritis 05/09/2014  . Ulcerative colitis ([place]) 05/27/2013  . Hypokalemia 05/27/2013  . Anemia 01/04/2012  . Transaminitis 01/04/2012  . Diarrhea 01/04/2012  . Chest pain 01/03/2012  . SOB (shortness of breath) 01/03/2012  . HTN (hypertension), benign 01/03/2012  . Hx of migraines 01/03/2012  . Seizure ([place]) 01/03/2012  . Depression 01/03/2012  . Ulcerative colitis 01/03/2012  . Chronic back pain 01/03/2012  . 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 Hyperlipidemia 01/03/2012  . Hepatitis C 01/03/2012  . Fibroids 01/03/2012   Past Medical History:  Diagnosis Date  . Anxiety   . Arthritis   . Chronic abdominal pain   . Chronic lower back pain   . Chronic nausea   . Depression   . Diabetes mellitus without complication ([place])   . Diverticulosis   . Fibromyalgia   . Frequency of urination   . GERD (gastroesophageal reflux disease)   . Headache   . Hematuria   . Hepatitis C antibody positive in blood    per pt told by pcp  . History of panic attacks   . History of syncope    hx recurrent syncope --- per epic  domentation non-cardiac , orthostatic hypotension, anxiety, dehydration  . History of uterine fibroid   . HTN (hypertension), benign   . Hyperlipidemia 01/03/2012  . IBS (irritable bowel syndrome)   . Myalgia   . Seizure disorder ([place]) followed by pcp until new neurologist appr. in jan 2019   "started having them in my 20's" ,  pet[place] mal ----  per pt last seizure one 2015  . SLE (systemic lupus erythematosus) [facility])    rheumatologist-- dr Gerilyn Nestle (consult 11-12-2016) having work-up done  . Ulcerative colitis    followed by dr Penelope Coop at [place]  . Wears glasses     Past Surgical  History:  Procedure Laterality Date  . ABDOMINAL HYSTERECTOMY  1988  . CHOLECYSTECTOMY OPEN  1978  . COLONOSCOPY N/A 05/31/2013   Procedure: COLONOSCOPY;  Surgeon: Wonda Horner, MD;  Location: [facility] ENDOSCOPY;  Service: Endoscopy;  Laterality: N/A;  . CYSTOSCOPY/RETROGRADE/URETEROSCOPY Bilateral 07/23/2017   Procedure: CYSTOSCOPY/RETROGRADE/URETEROSCOPY;  Surgeon: Ceasar Mons, MD;  Location: [facility];  Service: Urology;  Laterality: Bilateral;  . ESOPHAGOGASTRODUODENOSCOPY (EGD) WITH PROPOFOL N/A 05/11/2014   Procedure: ESOPHAGOGASTRODUODENOSCOPY (EGD) WITH PROPOFOL;  Surgeon: Lear Ng, MD;  Location: [facility] ENDOSCOPY;  Service: Endoscopy;  Laterality: N/A;  egd first  . ESOPHAGOGASTRODUODENOSCOPY (EGD) WITH PROPOFOL N/A 05/08/2020   Procedure: ESOPHAGOGASTRODUODENOSCOPY (EGD) WITH PROPOFOL;  Surgeon: Arta Silence, MD;  Location: [facility] ENDOSCOPY;  Service: Endoscopy;  Laterality: N/A;  . EUS N/A 06/21/2016   Procedure: ESOPHAGEAL ENDOSCOPIC ULTRASOUND (EUS) RADIAL;  Surgeon: Arta Silence, MD;  Location: [facility] ENDOSCOPY;  Service: Endoscopy;  Laterality: N/A;  . FLEXIBLE SIGMOIDOSCOPY N/A 05/11/2014   Procedure: FLEXIBLE SIGMOIDOSCOPY;  Surgeon: Lear Ng, MD;  Location: [facility] ENDOSCOPY;  Service: Endoscopy;  Laterality: N/A;  . LAPAROTOMY W/ BILATERAL SALPINGOOPHORECTOMY  09-26-2003   dr Matthew Sar[facility] at [facility]  . LAPROSCOPY W/ LYSIS ADHESIONS  06-08-2003   dr Radene Knee [facility]  . TRANSTHORACIC ECHOCARDIOGRAM  04/29/2016   ef 60-65%,  grade 2 diastolic dysfunction/  trivial MR and TR  . TUBAL LIGATION Bilateral yrs ago  . UPPER ESOPHAGEAL ENDOSCOPIC ULTRASOUND (EUS) N/A 05/08/2020   Procedure: UPPER ESOPHAGEAL ENDOSCOPIC ULTRASOUND (EUS);  Surgeon: Arta Silence, MD;  Location: [facility];  Service: Endoscopy;  Laterality: N/A;    No current facility-administered medications for this encounter.   Current Outpatient Medications  Medication Sig Dispense Refill Last Dose   . acetaminophen (TYLENOL) 500 MG tablet Take 500 mg by mouth every 6 (six) hours as needed for moderate pain.     . cyclobenzaprine (FLEXERIL) 10 MG tablet Take 10 mg by mouth as needed for muscle spasms.     . diazepam (VALIUM) 5 MG tablet Take 5 mg by mouth 2 (two) times daily as needed for anxiety.      Marland Kitchen esomeprazole (NEXIUM) 40 MG capsule Take 40 mg by mouth daily as needed (acid reflux).     Marland Kitchen FLUoxetine (PROZAC) 20 MG capsule Take 20 mg by mouth daily.      . furosemide (LASIX) 20 MG tablet Take 20 mg by mouth daily as needed for edema.     . hydrOXYzine (ATARAX/VISTARIL) 25 MG tablet Take 25 mg by mouth at bedtime as needed for sleep.     . Multiple Vitamins-Minerals (WOMENS MULTI PO) Take 1 tablet by mouth daily.     Marland Kitchen neomycin-polymyxin-hydrocortisone (CORTISPORIN) 3.5-10000-1 OTIC suspension Place 2-5 drops into the right ear daily as needed (vertigo).     . Oxycodone HCl 10 MG TABS Take  10-20 mg by mouth 3 (three) times daily as needed for pain.     Vladimir Faster Glycol-Propyl Glycol (SYSTANE OP) Place 1 drop into both eyes 2 (two) times daily.     . polyethylene glycol (MIRALAX) 17 g packet Take 17 g by mouth daily. (Patient taking differently: Take 17 g by mouth daily as needed for moderate constipation.) 14 each 0   . rosuvastatin (CRESTOR) 10 MG tablet Take 10 mg by mouth every Monday.     . triamterene-hydrochlorothiazide (MAXZIDE) 75-50 MG tablet Take 1 tablet by mouth every morning.     . docusate sodium (COLACE) 100 MG capsule Take 1 capsule (100 mg total) by mouth 2 (two) times daily. (Patient not taking: No sig reported) 60 capsule 0 Not Taking at Unknown time   Allergies  Allergen Reactions  . Latex Other (See Comments)    Peels skin  . Nsaids Other (See Comments)    HAS COLITIS FLARES  . Penicillins Hives    Has patient had a PCN reaction causing immediate rash, facial/tongue/throat swelling, SOB or lightheadedness with hypotension: Yes Has patient had a PCN reaction  causing severe rash involving mucus membranes or skin necrosis: Unknown Has patient had a PCN reaction that required hospitalization: no Has patient had a PCN reaction occurring within the last 10 years: No If all of the above answers are "NO", then may proceed with Cephalosporin use.   . Wellbutrin [Bupropion Hcl] Other (See Comments)    Insomnia and headaches  . Aspirin Nausea Only  . Darvocet [Propoxyphene N-Acetaminophen] Nausea And Vomiting  . Isometheptene-Dichloral-Apap Nausea Only    IV dye?  Marland Kitchen Tape Hives  . Toradol [Ketorolac Tromethamine] Nausea And Vomiting  . Tylenol [Acetaminophen] Nausea Only  . Milk-Related Compounds Diarrhea  . Other     Spicy foods cause diarrhea due to colitis    Social History   Tobacco Use  . Smoking status: Never Smoker  . Smokeless tobacco: Never Used  Substance Use Topics  . Alcohol use: Yes    Comment: occ    Family History  Problem Relation Age of Onset  . Alzheimer's disease Mother   . Hypertension Mother   . Colon cancer Brother   . Hypertension Brother   . Other Father        ruptured appendix  . Breast cancer Sister   . Hypertension Sister   . Hypertension Sister   . Memory loss Brother   . Hypertension Sister   . Heart disease Sister      Review of Systems  Constitutional: Negative.   HENT: Negative.   Eyes: Negative.   Respiratory: Negative.   Cardiovascular: Negative.   Gastrointestinal: Positive for heartburn.  Genitourinary: Positive for frequency.  Musculoskeletal: Positive for joint pain.  Skin: Negative.   Neurological: Positive for headaches.  Endo/Heme/Allergies: Negative.   Psychiatric/Behavioral: Positive for depression. The patient is nervous/anxious.      Objective:  Physical Exam Constitutional:      Appearance: She is well-developed.  HENT:     Head: Normocephalic.  Eyes:     Pupils: Pupils are equal, round, and reactive to light.  Neck:     Thyroid: No thyromegaly.     Vascular: No  JVD.     Trachea: No tracheal deviation.  Cardiovascular:     Rate and Rhythm: Normal rate and regular rhythm.     Pulses: Intact distal pulses.  Pulmonary:     Effort: Pulmonary effort is normal. No respiratory distress.  Breath sounds: Normal breath sounds. No wheezing.  Abdominal:     Palpations: Abdomen is soft.     Tenderness: There is no abdominal tenderness. There is no guarding.  Musculoskeletal:     Cervical back: Neck supple.     Left knee: Swelling and bony tenderness present. No erythema or ecchymosis. Decreased range of motion. Tenderness present.  Lymphadenopathy:     Cervical: No cervical adenopathy.  Skin:    General: Skin is warm and dry.  Neurological:     Mental Status: She is alert and oriented to person, place, and time.     Sensory: Sensory deficit (occasional tingling in her toes bilaterally) present.  Psychiatric:        Mood and Affect: Mood and affect normal.      Labs:  Estimated body mass index is 36.61 kg/m as calculated from the following:   Height as of 11/25/20: 5\' 5"  (1.651 m).   Weight as of 11/25/20: 99.8 kg.   Imaging Review Plain radiographs demonstrate severe degenerative joint disease of the left knee(s).  The bone quality appears to be good for age and reported activity level.      Assessment/Plan:  End stage arthritis, left knee   The patient history, physical examination, clinical judgment of the provider and imaging studies are consistent with end stage degenerative joint disease of the left knee(s) and total knee arthroplasty is deemed medically necessary. The treatment options including medical management, injection therapy arthroscopy and arthroplasty were discussed at length. The risks and benefits of total knee arthroplasty were presented and reviewed. The risks due to aseptic loosening, infection, stiffness, patella tracking problems, thromboembolic complications and other imponderables were discussed. The patient  acknowledged the explanation, agreed to proceed with the plan and consent was signed. Patient is being admitted for treatment for surgery, pain control, PT, OT, prophylactic antibiotics, VTE prophylaxis, progressive ambulation and ADL's and discharge planning. The patient is planning to be discharged home.     Patient's anticipated LOS is less than 2 midnights, meeting these requirements: - Younger than 32 - Lives within 1 hour of care - Has a competent adult at home to recover with post-op recover - NO history of  - Chronic pain requiring opiods  - Diabetes  - Coronary Artery Disease  - Heart failure  - Heart attack  - Stroke  - DVT/VTE  - Cardiac arrhythmia  - Respiratory Failure/COPD  - Renal failure  - Anemia  - Advanced Liver disease

## 2020-12-11 DIAGNOSIS — G8929 Other chronic pain: Secondary | ICD-10-CM | POA: Diagnosis not present

## 2020-12-11 DIAGNOSIS — M1712 Unilateral primary osteoarthritis, left knee: Secondary | ICD-10-CM | POA: Diagnosis not present

## 2020-12-11 DIAGNOSIS — H269 Unspecified cataract: Secondary | ICD-10-CM | POA: Diagnosis not present

## 2020-12-11 DIAGNOSIS — I10 Essential (primary) hypertension: Secondary | ICD-10-CM | POA: Diagnosis not present

## 2020-12-11 DIAGNOSIS — E7800 Pure hypercholesterolemia, unspecified: Secondary | ICD-10-CM | POA: Diagnosis not present

## 2020-12-11 DIAGNOSIS — G43001 Migraine without aura, not intractable, with status migrainosus: Secondary | ICD-10-CM | POA: Diagnosis not present

## 2020-12-11 DIAGNOSIS — F324 Major depressive disorder, single episode, in partial remission: Secondary | ICD-10-CM | POA: Diagnosis not present

## 2020-12-11 DIAGNOSIS — E1169 Type 2 diabetes mellitus with other specified complication: Secondary | ICD-10-CM | POA: Diagnosis not present

## 2020-12-11 DIAGNOSIS — M150 Primary generalized (osteo)arthritis: Secondary | ICD-10-CM | POA: Diagnosis not present

## 2020-12-13 DIAGNOSIS — M1712 Unilateral primary osteoarthritis, left knee: Secondary | ICD-10-CM | POA: Diagnosis not present

## 2020-12-14 NOTE — Patient Instructions (Signed)
DUE TO COVID-19 ONLY ONE VISITOR IS ALLOWED TO COME WITH YOU AND STAY IN THE WAITING ROOM ONLY DURING PRE OP AND PROCEDURE DAY OF SURGERY. THE 1 VISITOR  MAY VISIT WITH YOU AFTER SURGERY IN YOUR PRIVATE ROOM DURING VISITING HOURS ONLY!  YOU NEED TO HAVE A COVID 19 TEST ON: 12/15/20 @ 2:35 PM, THIS TEST MUST BE DONE BEFORE SURGERY,  COVID TESTING SITE [place] [id number], IT IS ON THE RIGHT GOING OUT [address] APPROXIMATELY  2 MINUTES PAST [employer] ON THE RIGHT. ONCE YOUR COVID TEST IS COMPLETED,  PLEASE BEGIN THE QUARANTINE INSTRUCTIONS AS OUTLINED IN YOUR HANDOUT.                Erin Wolf   Your procedure is scheduled on: 12/19/20   Report to [facility] Main  Entrance   Report to admitting at: 1:00 PM     Call this number if you have problems the morning of surgery [phone]    Remember: Do not eat solid food :After Midnight. Clear liquids until: 12:30 PM  CLEAR LIQUID DIET  Foods Allowed                                                                     Foods Excluded  Coffee and tea, regular and decaf                             liquids that you cannot  Plain Jell-O any favor except red or purple                                           see through such as: Fruit ices (not with fruit pulp)                                     milk, soups, orange juice  Iced Popsicles                                    All solid food Carbonated beverages, regular and diet                                    Cranberry, grape and apple juices Sports drinks like Gatorade Lightly seasoned clear broth or consume(fat free) Sugar, honey syrup  Sample Menu Breakfast                                Lunch                                     Supper Cranberry juice  Beef broth                            Chicken broth Jell-O                                     Grape juice                           Apple juice Coffee or tea                         Jell-O                                      Popsicle                                                Coffee or tea                        Coffee or tea  _____________________________________________________________________  BRUSH YOUR TEETH MORNING OF SURGERY AND RINSE YOUR MOUTH OUT, NO CHEWING GUM CANDY OR MINTS.    Take these medicines the morning of surgery with A SIP OF WATER: esomeprazole,fluoxetine.Diazepam as needed.Use eye drops as usual.  DO NOT TAKE ANY DIABETIC MEDICATIONS DAY OF YOUR SURGERY                               You may not have any metal on your body including hair pins and              piercings  Do not wear jewelry, make-up, lotions, powders or perfumes, deodorant             Do not wear nail polish on your fingernails.  Do not shave  48 hours prior to surgery.    Do not bring valuables to the hospital. [place].  Contacts, dentures or bridgework may not be worn into surgery.  Leave suitcase in the car. After surgery it may be brought to your room.     Patients discharged the day of surgery will not be allowed to drive home. IF YOU ARE HAVING SURGERY AND GOING HOME THE SAME DAY, YOU MUST HAVE AN ADULT TO DRIVE YOU HOME AND BE WITH YOU FOR 24 HOURS. YOU MAY GO HOME BY TAXI OR UBER OR ORTHERWISE, BUT AN ADULT MUST ACCOMPANY YOU HOME AND STAY WITH YOU FOR 24 HOURS.  Name and phone number of your driver:  Special Instructions: N/A              Please read over the following fact sheets you were given: _____________________________________________________________________        [facility] - Preparing for Surgery Before surgery, you can play an important role.  Because skin is not sterile, your skin needs to be as free of germs as possible.  You can reduce the number of germs on your  skin by washing with CHG (chlorahexidine gluconate) soap before surgery.  CHG is an antiseptic cleaner which kills germs and  bonds with the skin to continue killing germs even after washing. Please DO NOT use if you have an allergy to CHG or antibacterial soaps.  If your skin becomes reddened/irritated stop using the CHG and inform your nurse when you arrive at Short Stay. Do not shave (including legs and underarms) for at least 48 hours prior to the first CHG shower.  You may shave your face/neck. Please follow these instructions carefully:  1.  Shower with CHG Soap the night before surgery and the  morning of Surgery.  2.  If you choose to wash your hair, wash your hair first as usual with your  normal  shampoo.  3.  After you shampoo, rinse your hair and body thoroughly to remove the  shampoo.                           4.  Use CHG as you would any other liquid soap.  You can apply chg directly  to the skin and wash                       Gently with a scrungie or clean washcloth.  5.  Apply the CHG Soap to your body ONLY FROM THE NECK DOWN.   Do not use on face/ open                           Wound or open sores. Avoid contact with eyes, ears mouth and genitals (private parts).                       Wash face,  Genitals (private parts) with your normal soap.             6.  Wash thoroughly, paying special attention to the area where your surgery  will be performed.  7.  Thoroughly rinse your body with warm water from the neck down.  8.  DO NOT shower/wash with your normal soap after using and rinsing off  the CHG Soap.                9.  Pat yourself dry with a clean towel.            10.  Wear clean pajamas.            11.  Place clean sheets on your bed the night of your first shower and do not  sleep with pets. Day of Surgery : Do not apply any lotions/deodorants the morning of surgery.  Please wear clean clothes to the hospital/surgery center.  FAILURE TO FOLLOW THESE INSTRUCTIONS MAY RESULT IN THE CANCELLATION OF YOUR SURGERY PATIENT SIGNATURE_________________________________  NURSE  SIGNATURE__________________________________  ________________________________________________________________________   Erin Wolf  An incentive spirometer is a tool that can help keep your lungs clear and active. This tool measures how well you are filling your lungs with each breath. Taking long deep breaths may help reverse or decrease the chance of developing breathing (pulmonary) problems (especially infection) following:  A long period of time when you are unable to move or be active. BEFORE THE PROCEDURE   If the spirometer includes an indicator to show your best effort, your nurse or respiratory therapist will set it to a desired goal.  If possible,  sit up straight or lean slightly forward. Try not to slouch.  Hold the incentive spirometer in an upright position. INSTRUCTIONS FOR USE  1. Sit on the edge of your bed if possible, or sit up as far as you can in bed or on a chair. 2. Hold the incentive spirometer in an upright position. 3. Breathe out normally. 4. Place the mouthpiece in your mouth and seal your lips tightly around it. 5. Breathe in slowly and as deeply as possible, raising the piston or the ball toward the top of the column. 6. Hold your breath for 3-5 seconds or for as long as possible. Allow the piston or ball to fall to the bottom of the column. 7. Remove the mouthpiece from your mouth and breathe out normally. 8. Rest for a few seconds and repeat Steps 1 through 7 at least 10 times every 1-2 hours when you are awake. Take your time and take a few normal breaths between deep breaths. 9. The spirometer may include an indicator to show your best effort. Use the indicator as a goal to work toward during each repetition. 10. After each set of 10 deep breaths, practice coughing to be sure your lungs are clear. If you have an incision (the cut made at the time of surgery), support your incision when coughing by placing a pillow or rolled up towels firmly  against it. Once you are able to get out of bed, walk around indoors and cough well. You may stop using the incentive spirometer when instructed by your caregiver.  RISKS AND COMPLICATIONS  Take your time so you do not get dizzy or light-headed.  If you are in pain, you may need to take or ask for pain medication before doing incentive spirometry. It is harder to take a deep breath if you are having pain. AFTER USE  Rest and breathe slowly and easily.  It can be helpful to keep track of a log of your progress. Your caregiver can provide you with a simple table to help with this. If you are using the spirometer at home, follow these instructions: [place] IF:   You are having difficultly using the spirometer.  You have trouble using the spirometer as often as instructed.  Your pain medication is not giving enough relief while using the spirometer.  You develop fever of 100.5 F (38.1 C) or higher. SEEK IMMEDIATE MEDICAL CARE IF:   You cough up bloody sputum that had not been present before.  You develop fever of 102 F (38.9 C) or greater.  You develop worsening pain at or near the incision site. MAKE SURE YOU:   Understand these instructions.  Will watch your condition.  Will get help right away if you are not doing well or get worse. Document Released: 02/17/2007 Document Revised: 12/30/2011 Document Reviewed: 04/20/2007 [facility] Patient Information 2014 [place], [place].   ________________________________________________________________________

## 2020-12-15 ENCOUNTER — Encounter (HOSPITAL_COMMUNITY)
Admission: RE | Admit: 2020-12-15 | Discharge: 2020-12-15 | Disposition: A | Discharge status: Home / Self Care | Payer: Medicare HMO | Source: Ambulatory Visit | Attending: Orthopedic Surgery | Admitting: Orthopedic Surgery

## 2020-12-15 ENCOUNTER — Other Ambulatory Visit (HOSPITAL_COMMUNITY)
Admission: RE | Admit: 2020-12-15 | Discharge: 2020-12-15 | Disposition: A | Discharge status: Home / Self Care | Payer: Medicare HMO | Source: Ambulatory Visit | Attending: Orthopedic Surgery | Admitting: Orthopedic Surgery

## 2020-12-15 ENCOUNTER — Other Ambulatory Visit: Payer: Self-pay

## 2020-12-15 ENCOUNTER — Encounter (HOSPITAL_COMMUNITY): Payer: Self-pay

## 2020-12-15 DIAGNOSIS — Z01812 Encounter for preprocedural laboratory examination: Secondary | ICD-10-CM | POA: Insufficient documentation

## 2020-12-15 DIAGNOSIS — Z20822 Contact with and (suspected) exposure to covid-19: Secondary | ICD-10-CM | POA: Insufficient documentation

## 2020-12-15 DIAGNOSIS — Z01818 Encounter for other preprocedural examination: Secondary | ICD-10-CM | POA: Insufficient documentation

## 2020-12-15 LAB — GLUCOSE, CAPILLARY: Glucose-Capillary: 159 mg/dL — ABNORMAL HIGH (ref 70–99)

## 2020-12-15 LAB — BASIC METABOLIC PANEL
Anion gap: 12 (ref 5–15)
BUN: 14 mg/dL (ref 8–23)
CO2: 29 mmol/L (ref 22–32)
Calcium: 9.3 mg/dL (ref 8.9–10.3)
Chloride: 100 mmol/L (ref 98–111)
Creatinine, Ser: 1.09 mg/dL — ABNORMAL HIGH (ref 0.44–1.00)
GFR, Estimated: 56 mL/min — ABNORMAL LOW (ref >60)
Glucose, Bld: 138 mg/dL — ABNORMAL HIGH (ref 70–99)
Potassium: 3.4 mmol/L — ABNORMAL LOW (ref 3.5–5.1)
Sodium: 141 mmol/L (ref 135–145)

## 2020-12-15 LAB — CBC
HCT: 42.1 % (ref 36.0–46.0)
Hemoglobin: 12.9 g/dL (ref 12.0–15.0)
MCH: 25.9 pg — ABNORMAL LOW (ref 26.0–34.0)
MCHC: 30.6 g/dL (ref 30.0–36.0)
MCV: 84.4 fL (ref 80.0–100.0)
Platelets: 322 10*3/uL (ref 150–400)
RBC: 4.99 MIL/uL (ref 3.87–5.11)
RDW: 15.4 % (ref 11.5–15.5)
WBC: 4.7 10*3/uL (ref 4.0–10.5)
nRBC: 0 % (ref 0.0–0.2)

## 2020-12-15 LAB — HEMOGLOBIN A1C
Hgb A1c MFr Bld: 6.9 % — ABNORMAL HIGH (ref 4.8–5.6)
Mean Plasma Glucose: 151.33 mg/dL

## 2020-12-15 LAB — SURGICAL PCR SCREEN
MRSA, PCR: NEGATIVE
Staphylococcus aureus: POSITIVE — AB

## 2020-12-15 LAB — SARS CORONAVIRUS 2 (TAT 6-24 HRS): SARS Coronavirus 2: NEGATIVE

## 2020-12-15 NOTE — Progress Notes (Signed)
COVID Vaccine Completed: Yes Date COVID Vaccine completed:01/26/20 COVID vaccine manufacturer:  [employer]   PCP - Dr. Shirline Frees Cardiologist -   Chest x-ray -  EKG -  Stress Test -  ECHO -  Cardiac Cath -  Pacemaker/ICD device last checked:  Sleep Study -  CPAP -   Fasting Blood Sugar -  Checks Blood Sugar _____ times a day  Blood Thinner Instructions: Aspirin Instructions: Last Dose:  Anesthesia review: Hx: HTN,seizures(last on 2019),syncope.  Patient denies shortness of breath, fever, cough and chest pain at PAT appointment   Patient verbalized understanding of instructions that were given to them at the PAT appointment. Patient was also instructed that they will need to review over the PAT instructions again at home before surgery.

## 2020-12-19 ENCOUNTER — Ambulatory Visit (HOSPITAL_COMMUNITY): Payer: Medicare HMO | Admitting: Anesthesiology

## 2020-12-19 ENCOUNTER — Encounter (HOSPITAL_COMMUNITY): Payer: Self-pay | Admitting: Orthopedic Surgery

## 2020-12-19 ENCOUNTER — Observation Stay (HOSPITAL_COMMUNITY)
Admission: RE | Admit: 2020-12-19 | Discharge: 2020-12-20 | Disposition: A | Discharge status: Home / Self Care | Payer: Medicare HMO | Attending: Orthopedic Surgery | Admitting: Orthopedic Surgery

## 2020-12-19 ENCOUNTER — Other Ambulatory Visit: Payer: Self-pay

## 2020-12-19 ENCOUNTER — Encounter (HOSPITAL_COMMUNITY)
Admission: RE | Disposition: A | Discharge status: Home / Self Care | Payer: Self-pay | Source: Home / Self Care | Attending: Orthopedic Surgery

## 2020-12-19 DIAGNOSIS — Z91040 Latex allergy status: Secondary | ICD-10-CM | POA: Diagnosis not present

## 2020-12-19 DIAGNOSIS — Z79899 Other long term (current) drug therapy: Secondary | ICD-10-CM | POA: Insufficient documentation

## 2020-12-19 DIAGNOSIS — E119 Type 2 diabetes mellitus without complications: Secondary | ICD-10-CM | POA: Diagnosis not present

## 2020-12-19 DIAGNOSIS — M1712 Unilateral primary osteoarthritis, left knee: Principal | ICD-10-CM | POA: Diagnosis present

## 2020-12-19 DIAGNOSIS — Z880 Allergy status to penicillin: Secondary | ICD-10-CM | POA: Insufficient documentation

## 2020-12-19 DIAGNOSIS — G8918 Other acute postprocedural pain: Secondary | ICD-10-CM | POA: Diagnosis not present

## 2020-12-19 DIAGNOSIS — I10 Essential (primary) hypertension: Secondary | ICD-10-CM | POA: Diagnosis not present

## 2020-12-19 DIAGNOSIS — Z96652 Presence of left artificial knee joint: Secondary | ICD-10-CM

## 2020-12-19 HISTORY — PX: TOTAL KNEE ARTHROPLASTY: SHX125

## 2020-12-19 LAB — GLUCOSE, CAPILLARY
Glucose-Capillary: 90 mg/dL (ref 70–99)
Glucose-Capillary: 116 mg/dL — ABNORMAL HIGH (ref 70–99)

## 2020-12-19 LAB — TYPE AND SCREEN
ABO/RH(D): O POS
Antibody Screen: NEGATIVE

## 2020-12-19 SURGERY — ARTHROPLASTY, KNEE, TOTAL
Anesthesia: General | Site: Knee | Laterality: Left

## 2020-12-19 MED ORDER — HYDROMORPHONE HCL 1 MG/ML IJ SOLN
INTRAMUSCULAR | Status: DC | PRN
  Administered 2020-12-19 (×5): .4 mg via INTRAVENOUS

## 2020-12-19 MED ORDER — MAGNESIUM CITRATE PO SOLN: 1.0000 | Freq: Once | ORAL | Status: DC | PRN

## 2020-12-19 MED ORDER — DEXAMETHASONE SODIUM PHOSPHATE 10 MG/ML IJ SOLN
INTRAMUSCULAR | Status: DC | PRN
  Administered 2020-12-19: 10 mg via INTRAVENOUS

## 2020-12-19 MED ORDER — SODIUM CHLORIDE (PF) 0.9 % IJ SOLN
INTRAMUSCULAR | Status: AC
  Filled 2020-12-19: qty 10

## 2020-12-19 MED ORDER — SODIUM CHLORIDE (PF) 0.9 % IJ SOLN
INTRAMUSCULAR | Status: DC | PRN
  Administered 2020-12-19: 30 mL

## 2020-12-19 MED ORDER — OXYCODONE HCL 5 MG PO TABS
20.0000 mg | ORAL_TABLET | ORAL | Status: DC | PRN
  Administered 2020-12-19 – 2020-12-20 (×2): 20 mg via ORAL
  Filled 2020-12-19 (×2): qty 4

## 2020-12-19 MED ORDER — PANTOPRAZOLE SODIUM 40 MG PO TBEC: 40.0000 mg | DELAYED_RELEASE_TABLET | Freq: Every day | ORAL | Status: DC | PRN

## 2020-12-19 MED ORDER — BUPIVACAINE-EPINEPHRINE (PF) 0.25% -1:200000 IJ SOLN
INTRAMUSCULAR | Status: DC | PRN
  Administered 2020-12-19: 30 mL

## 2020-12-19 MED ORDER — FENTANYL CITRATE (PF) 100 MCG/2ML IJ SOLN
50.0000 ug | INTRAMUSCULAR | Status: DC
  Administered 2020-12-19: 50 ug via INTRAVENOUS
  Filled 2020-12-19: qty 2

## 2020-12-19 MED ORDER — STERILE WATER FOR IRRIGATION IR SOLN
Status: DC | PRN
  Administered 2020-12-19: 2000 mL

## 2020-12-19 MED ORDER — MIDAZOLAM HCL 2 MG/2ML IJ SOLN
INTRAMUSCULAR | Status: DC | PRN
  Administered 2020-12-19: 2 mg via INTRAVENOUS

## 2020-12-19 MED ORDER — METHOCARBAMOL 500 MG PO TABS
500.0000 mg | ORAL_TABLET | Freq: Four times a day (QID) | ORAL | Status: DC | PRN
  Administered 2020-12-20: 500 mg via ORAL
  Filled 2020-12-19 (×2): qty 1

## 2020-12-19 MED ORDER — SODIUM CHLORIDE 0.9 % IV SOLN: INTRAVENOUS | Status: DC

## 2020-12-19 MED ORDER — PHENOL 1.4 % MT LIQD: 1.0000 | OROMUCOSAL | Status: DC | PRN

## 2020-12-19 MED ORDER — TRANEXAMIC ACID-NACL 1000-0.7 MG/100ML-% IV SOLN
1000.0000 mg | INTRAVENOUS | Status: AC
  Administered 2020-12-19: 1000 mg via INTRAVENOUS
  Filled 2020-12-19: qty 100

## 2020-12-19 MED ORDER — OXYCODONE HCL 5 MG PO TABS
5.0000 mg | ORAL_TABLET | Freq: Once | ORAL | Status: AC | PRN
  Administered 2020-12-19: 5 mg via ORAL

## 2020-12-19 MED ORDER — BUPIVACAINE-EPINEPHRINE (PF) 0.25% -1:200000 IJ SOLN
INTRAMUSCULAR | Status: AC
  Filled 2020-12-19: qty 30

## 2020-12-19 MED ORDER — SODIUM CHLORIDE 0.9 % IR SOLN
Status: DC | PRN
  Administered 2020-12-19: 3000 mL

## 2020-12-19 MED ORDER — PROPOFOL 10 MG/ML IV BOLUS
INTRAVENOUS | Status: DC | PRN
  Administered 2020-12-19: 200 mg via INTRAVENOUS

## 2020-12-19 MED ORDER — ONDANSETRON HCL 4 MG/2ML IJ SOLN: 4.0000 mg | Freq: Four times a day (QID) | INTRAMUSCULAR | Status: DC | PRN

## 2020-12-19 MED ORDER — FERROUS SULFATE 325 (65 FE) MG PO TABS
325.0000 mg | ORAL_TABLET | Freq: Two times a day (BID) | ORAL | Status: DC
  Administered 2020-12-20: 325 mg via ORAL
  Filled 2020-12-19: qty 1

## 2020-12-19 MED ORDER — ROCURONIUM BROMIDE 10 MG/ML (PF) SYRINGE
PREFILLED_SYRINGE | INTRAVENOUS | Status: AC
  Filled 2020-12-19: qty 10

## 2020-12-19 MED ORDER — POLYETHYLENE GLYCOL 3350 17 G PO PACK
17.0000 g | PACK | Freq: Two times a day (BID) | ORAL | Status: DC
  Administered 2020-12-19 – 2020-12-20 (×2): 17 g via ORAL
  Filled 2020-12-19 (×2): qty 1

## 2020-12-19 MED ORDER — DIAZEPAM 5 MG PO TABS
5.0000 mg | ORAL_TABLET | Freq: Two times a day (BID) | ORAL | Status: DC | PRN
  Administered 2020-12-20: 5 mg via ORAL
  Filled 2020-12-19: qty 1

## 2020-12-19 MED ORDER — ROCURONIUM BROMIDE 10 MG/ML (PF) SYRINGE
PREFILLED_SYRINGE | INTRAVENOUS | Status: DC | PRN
  Administered 2020-12-19: 70 mg via INTRAVENOUS

## 2020-12-19 MED ORDER — FLUOXETINE HCL 20 MG PO CAPS
20.0000 mg | ORAL_CAPSULE | Freq: Every day | ORAL | Status: DC
  Administered 2020-12-19 – 2020-12-20 (×2): 20 mg via ORAL
  Filled 2020-12-19 (×2): qty 1

## 2020-12-19 MED ORDER — ONDANSETRON HCL 4 MG PO TABS: 4.0000 mg | ORAL_TABLET | Freq: Four times a day (QID) | ORAL | Status: DC | PRN

## 2020-12-19 MED ORDER — HYDROXYZINE HCL 25 MG PO TABS: 25.0000 mg | ORAL_TABLET | Freq: Every evening | ORAL | Status: DC | PRN

## 2020-12-19 MED ORDER — FUROSEMIDE 20 MG PO TABS: 20.0000 mg | ORAL_TABLET | Freq: Every day | ORAL | Status: DC | PRN

## 2020-12-19 MED ORDER — DIPHENHYDRAMINE HCL 12.5 MG/5ML PO ELIX: 12.5000 mg | ORAL_SOLUTION | ORAL | Status: DC | PRN

## 2020-12-19 MED ORDER — ROSUVASTATIN CALCIUM 10 MG PO TABS: 10.0000 mg | ORAL_TABLET | ORAL | Status: DC

## 2020-12-19 MED ORDER — CEFAZOLIN SODIUM-DEXTROSE 2-4 GM/100ML-% IV SOLN
2.0000 g | Freq: Four times a day (QID) | INTRAVENOUS | Status: AC
  Administered 2020-12-19 – 2020-12-20 (×2): 2 g via INTRAVENOUS
  Filled 2020-12-19 (×2): qty 100

## 2020-12-19 MED ORDER — SUGAMMADEX SODIUM 200 MG/2ML IV SOLN
INTRAVENOUS | Status: DC | PRN
  Administered 2020-12-19: 250 mg via INTRAVENOUS

## 2020-12-19 MED ORDER — METOCLOPRAMIDE HCL 5 MG PO TABS: 5.0000 mg | ORAL_TABLET | Freq: Three times a day (TID) | ORAL | Status: DC | PRN

## 2020-12-19 MED ORDER — KETOROLAC TROMETHAMINE 30 MG/ML IJ SOLN
INTRAMUSCULAR | Status: DC | PRN
  Administered 2020-12-19: 30 mg via INTRAVENOUS

## 2020-12-19 MED ORDER — PHENYLEPHRINE 40 MCG/ML (10ML) SYRINGE FOR IV PUSH (FOR BLOOD PRESSURE SUPPORT)
PREFILLED_SYRINGE | INTRAVENOUS | Status: AC
  Filled 2020-12-19: qty 10

## 2020-12-19 MED ORDER — LIDOCAINE HCL (PF) 2 % IJ SOLN
INTRAMUSCULAR | Status: AC
  Filled 2020-12-19: qty 5

## 2020-12-19 MED ORDER — PHENYLEPHRINE 40 MCG/ML (10ML) SYRINGE FOR IV PUSH (FOR BLOOD PRESSURE SUPPORT)
PREFILLED_SYRINGE | INTRAVENOUS | Status: DC | PRN
  Administered 2020-12-19: 80 ug via INTRAVENOUS

## 2020-12-19 MED ORDER — CHLORHEXIDINE GLUCONATE 0.12 % MT SOLN
15.0000 mL | Freq: Once | OROMUCOSAL | Status: AC
  Administered 2020-12-19: 15 mL via OROMUCOSAL

## 2020-12-19 MED ORDER — MENTHOL 3 MG MT LOZG
1.0000 | LOZENGE | OROMUCOSAL | Status: DC | PRN
  Administered 2020-12-20: 3 mg via ORAL
  Filled 2020-12-19: qty 9

## 2020-12-19 MED ORDER — HYDROMORPHONE HCL 1 MG/ML IJ SOLN
INTRAMUSCULAR | Status: AC
  Administered 2020-12-19: 1 mg
  Filled 2020-12-19: qty 2

## 2020-12-19 MED ORDER — 0.9 % SODIUM CHLORIDE (POUR BTL) OPTIME
TOPICAL | Status: DC | PRN
  Administered 2020-12-19: 1000 mL

## 2020-12-19 MED ORDER — DEXAMETHASONE SODIUM PHOSPHATE 10 MG/ML IJ SOLN
INTRAMUSCULAR | Status: AC
  Filled 2020-12-19: qty 1

## 2020-12-19 MED ORDER — MIDAZOLAM HCL 2 MG/2ML IJ SOLN
1.0000 mg | INTRAMUSCULAR | Status: DC
  Administered 2020-12-19: 2 mg via INTRAVENOUS
  Filled 2020-12-19: qty 2

## 2020-12-19 MED ORDER — OXYCODONE HCL 5 MG PO TABS: 10.0000 mg | ORAL_TABLET | ORAL | Status: DC | PRN

## 2020-12-19 MED ORDER — DEXAMETHASONE SODIUM PHOSPHATE 10 MG/ML IJ SOLN: 10.0000 mg | Freq: Once | INTRAMUSCULAR | Status: DC

## 2020-12-19 MED ORDER — BISACODYL 10 MG RE SUPP: 10.0000 mg | Freq: Every day | RECTAL | Status: DC | PRN

## 2020-12-19 MED ORDER — SUGAMMADEX SODIUM 500 MG/5ML IV SOLN
INTRAVENOUS | Status: AC
  Filled 2020-12-19: qty 5

## 2020-12-19 MED ORDER — FENTANYL CITRATE (PF) 250 MCG/5ML IJ SOLN
INTRAMUSCULAR | Status: AC
  Filled 2020-12-19: qty 5

## 2020-12-19 MED ORDER — METHOCARBAMOL 500 MG IVPB - SIMPLE MED
500.0000 mg | Freq: Four times a day (QID) | INTRAVENOUS | Status: DC | PRN
  Administered 2020-12-19: 500 mg via INTRAVENOUS
  Filled 2020-12-19: qty 50

## 2020-12-19 MED ORDER — HYDROMORPHONE HCL 2 MG/ML IJ SOLN
INTRAMUSCULAR | Status: AC
  Filled 2020-12-19: qty 1

## 2020-12-19 MED ORDER — SODIUM CHLORIDE (PF) 0.9 % IJ SOLN
INTRAMUSCULAR | Status: AC
  Filled 2020-12-19: qty 30

## 2020-12-19 MED ORDER — OXYCODONE HCL 5 MG PO TABS
ORAL_TABLET | ORAL | Status: AC
  Filled 2020-12-19: qty 1

## 2020-12-19 MED ORDER — LIDOCAINE 2% (20 MG/ML) 5 ML SYRINGE
INTRAMUSCULAR | Status: DC | PRN
  Administered 2020-12-19: 100 mg via INTRAVENOUS

## 2020-12-19 MED ORDER — ORAL CARE MOUTH RINSE: 15.0000 mL | Freq: Once | OROMUCOSAL | Status: AC

## 2020-12-19 MED ORDER — NON FORMULARY: 40.0000 mg | Freq: Every day | Status: DC | PRN

## 2020-12-19 MED ORDER — TRIAMTERENE-HCTZ 75-50 MG PO TABS
1.0000 | ORAL_TABLET | Freq: Every morning | ORAL | Status: DC
  Administered 2020-12-20: 1 via ORAL
  Filled 2020-12-19: qty 1

## 2020-12-19 MED ORDER — FENTANYL CITRATE (PF) 250 MCG/5ML IJ SOLN
INTRAMUSCULAR | Status: DC | PRN
  Administered 2020-12-19: 100 ug via INTRAVENOUS
  Administered 2020-12-19 (×3): 50 ug via INTRAVENOUS

## 2020-12-19 MED ORDER — DEXAMETHASONE SODIUM PHOSPHATE 10 MG/ML IJ SOLN
10.0000 mg | Freq: Once | INTRAMUSCULAR | Status: AC
  Administered 2020-12-20: 10 mg via INTRAVENOUS
  Filled 2020-12-19: qty 1

## 2020-12-19 MED ORDER — HYDROMORPHONE HCL 1 MG/ML IJ SOLN
0.5000 mg | INTRAMUSCULAR | Status: DC | PRN
  Administered 2020-12-19 – 2020-12-20 (×5): 1 mg via INTRAVENOUS
  Filled 2020-12-19 (×5): qty 1

## 2020-12-19 MED ORDER — KETAMINE HCL 10 MG/ML IJ SOLN
INTRAMUSCULAR | Status: DC | PRN
  Administered 2020-12-19: 50 mg via INTRAVENOUS
  Administered 2020-12-19: 10 mg via INTRAVENOUS
  Administered 2020-12-19: 20 mg via INTRAVENOUS

## 2020-12-19 MED ORDER — KETOROLAC TROMETHAMINE 30 MG/ML IJ SOLN
INTRAMUSCULAR | Status: AC
  Filled 2020-12-19: qty 1

## 2020-12-19 MED ORDER — METOCLOPRAMIDE HCL 5 MG/ML IJ SOLN: 5.0000 mg | Freq: Three times a day (TID) | INTRAMUSCULAR | Status: DC | PRN

## 2020-12-19 MED ORDER — POVIDONE-IODINE 10 % EX SWAB
2.0000 "application " | Freq: Once | CUTANEOUS | Status: AC
  Administered 2020-12-19: 2 via TOPICAL

## 2020-12-19 MED ORDER — PROPOFOL 500 MG/50ML IV EMUL
INTRAVENOUS | Status: AC
  Filled 2020-12-19: qty 50

## 2020-12-19 MED ORDER — OXYCODONE HCL 5 MG/5ML PO SOLN
5.0000 mg | Freq: Once | ORAL | Status: AC | PRN
Start: 2020-12-19 — End: 2020-12-19

## 2020-12-19 MED ORDER — LACTATED RINGERS IV SOLN: INTRAVENOUS | Status: DC

## 2020-12-19 MED ORDER — RIVAROXABAN 10 MG PO TABS
10.0000 mg | ORAL_TABLET | ORAL | Status: DC
  Administered 2020-12-20: 10 mg via ORAL
  Filled 2020-12-19: qty 1

## 2020-12-19 MED ORDER — ONDANSETRON HCL 4 MG/2ML IJ SOLN
INTRAMUSCULAR | Status: DC | PRN
  Administered 2020-12-19: 4 mg via INTRAVENOUS

## 2020-12-19 MED ORDER — ALUM & MAG HYDROXIDE-SIMETH 200-200-20 MG/5ML PO SUSP: 15.0000 mL | ORAL | Status: DC | PRN

## 2020-12-19 MED ORDER — BUPIVACAINE HCL (PF) 0.5 % IJ SOLN
INTRAMUSCULAR | Status: DC | PRN
  Administered 2020-12-19: 20 mL via PERINEURAL

## 2020-12-19 MED ORDER — ONDANSETRON HCL 4 MG/2ML IJ SOLN
INTRAMUSCULAR | Status: AC
  Filled 2020-12-19: qty 2

## 2020-12-19 MED ORDER — TRANEXAMIC ACID-NACL 1000-0.7 MG/100ML-% IV SOLN
1000.0000 mg | Freq: Once | INTRAVENOUS | Status: AC
  Administered 2020-12-19: 1000 mg via INTRAVENOUS
  Filled 2020-12-19: qty 100

## 2020-12-19 MED ORDER — METHOCARBAMOL 500 MG IVPB - SIMPLE MED
INTRAVENOUS | Status: AC
  Filled 2020-12-19: qty 50

## 2020-12-19 MED ORDER — DOCUSATE SODIUM 100 MG PO CAPS
100.0000 mg | ORAL_CAPSULE | Freq: Two times a day (BID) | ORAL | Status: DC
  Administered 2020-12-19 – 2020-12-20 (×2): 100 mg via ORAL
  Filled 2020-12-19 (×2): qty 1

## 2020-12-19 MED ORDER — CEFAZOLIN SODIUM-DEXTROSE 2-4 GM/100ML-% IV SOLN
2.0000 g | INTRAVENOUS | Status: AC
  Administered 2020-12-19: 2 g via INTRAVENOUS
  Filled 2020-12-19: qty 100

## 2020-12-19 MED ORDER — NEOMYCIN-POLYMYXIN-HC 3.5-10000-1 OT SUSP
2.0000 [drp] | Freq: Every day | OTIC | Status: DC | PRN
  Filled 2020-12-19: qty 10

## 2020-12-19 MED ORDER — PROPOFOL 10 MG/ML IV BOLUS
INTRAVENOUS | Status: AC
  Filled 2020-12-19: qty 20

## 2020-12-19 MED ORDER — HYDROMORPHONE HCL 1 MG/ML IJ SOLN
0.2500 mg | INTRAMUSCULAR | Status: DC | PRN
  Administered 2020-12-19 (×4): 0.5 mg via INTRAVENOUS

## 2020-12-19 SURGICAL SUPPLY — 58 items
ADH SKN CLS APL DERMABOND .7 (GAUZE/BANDAGES/DRESSINGS) ×1
ATTUNE MED ANAT PAT 35 KNEE (Knees) ×1 IMPLANT
ATTUNE PSFEM LTSZ4 NARCEM KNEE (Femur) ×1 IMPLANT
ATTUNE PSRP INSR SZ4 8 KNEE (Insert) ×1 IMPLANT
BAG SPEC THK2 15X12 ZIP CLS (MISCELLANEOUS)
BAG ZIPLOCK 12X15 (MISCELLANEOUS) IMPLANT
BASEPLATE TIBIAL ROTATING SZ 4 (Knees) ×1 IMPLANT
BLADE SAW SGTL 11.0X1.19X90.0M (BLADE) IMPLANT
BLADE SAW SGTL 13.0X1.19X90.0M (BLADE) ×2 IMPLANT
BLADE SURG SZ10 CARB STEEL (BLADE) ×4 IMPLANT
BNDG ELASTIC 6X5.8 VLCR STR LF (GAUZE/BANDAGES/DRESSINGS) ×2 IMPLANT
BOWL SMART MIX CTS (DISPOSABLE) ×2 IMPLANT
BSPLAT TIB 4 CMNT ROT PLAT STR (Knees) ×1 IMPLANT
CEMENT HV SMART SET (Cement) ×2 IMPLANT
COVER WAND RF STERILE (DRAPES) IMPLANT
CUFF TOURN SGL QUICK 34 (TOURNIQUET CUFF) ×2
CUFF TRNQT CYL 34X4.125X (TOURNIQUET CUFF) ×1 IMPLANT
DECANTER SPIKE VIAL GLASS SM (MISCELLANEOUS) ×4 IMPLANT
DERMABOND ADVANCED (GAUZE/BANDAGES/DRESSINGS) ×1
DERMABOND ADVANCED .7 DNX12 (GAUZE/BANDAGES/DRESSINGS) ×1 IMPLANT
DRAPE U-SHAPE 47X51 STRL (DRAPES) ×2 IMPLANT
DRESSING AQUACEL AG SP 3.5X10 (GAUZE/BANDAGES/DRESSINGS) ×1 IMPLANT
DRSG AQUACEL AG ADV 3.5X10 (GAUZE/BANDAGES/DRESSINGS) ×1 IMPLANT
DRSG AQUACEL AG SP 3.5X10 (GAUZE/BANDAGES/DRESSINGS) ×2
DURAPREP 26ML APPLICATOR (WOUND CARE) ×4 IMPLANT
ELECT REM PT RETURN 15FT ADLT (MISCELLANEOUS) ×2 IMPLANT
GLOVE ORTHO TXT STRL SZ7.5 (GLOVE) ×2 IMPLANT
GLOVE SURG ENC MOIS LTX SZ6 (GLOVE) IMPLANT
GLOVE SURG LTX SZ8 (GLOVE) ×2 IMPLANT
GLOVE SURG UNDER POLY LF SZ6.5 (GLOVE) IMPLANT
GLOVE SURG UNDER POLY LF SZ7.5 (GLOVE) ×2 IMPLANT
GLOVE SURG UNDER POLY LF SZ8.5 (GLOVE) IMPLANT
GOWN STRL REUS W/TWL 2XL LVL3 (GOWN DISPOSABLE) IMPLANT
GOWN STRL REUS W/TWL LRG LVL3 (GOWN DISPOSABLE) ×2 IMPLANT
HANDPIECE INTERPULSE COAX TIP (DISPOSABLE) ×2
HOLDER FOLEY CATH W/STRAP (MISCELLANEOUS) IMPLANT
KIT TURNOVER KIT A (KITS) ×2 IMPLANT
MANIFOLD NEPTUNE II (INSTRUMENTS) ×2 IMPLANT
NDL SAFETY ECLIPSE 18X1.5 (NEEDLE) IMPLANT
NEEDLE HYPO 18GX1.5 SHARP (NEEDLE)
NS IRRIG 1000ML POUR BTL (IV SOLUTION) ×2 IMPLANT
PACK TOTAL KNEE CUSTOM (KITS) ×2 IMPLANT
PENCIL SMOKE EVACUATOR (MISCELLANEOUS) IMPLANT
PIN DRILL FIX HALF THREAD (BIT) ×1 IMPLANT
PIN FIX SIGMA LCS THRD HI (PIN) ×1 IMPLANT
PROTECTOR NERVE ULNAR (MISCELLANEOUS) ×2 IMPLANT
SET HNDPC FAN SPRY TIP SCT (DISPOSABLE) ×1 IMPLANT
SET PAD KNEE POSITIONER (MISCELLANEOUS) ×2 IMPLANT
SUT MNCRL AB 4-0 PS2 18 (SUTURE) ×2 IMPLANT
SUT STRATAFIX PDS+ 0 24IN (SUTURE) ×2 IMPLANT
SUT VIC AB 1 CT1 36 (SUTURE) ×2 IMPLANT
SUT VIC AB 2-0 CT1 27 (SUTURE) ×6
SUT VIC AB 2-0 CT1 TAPERPNT 27 (SUTURE) ×3 IMPLANT
SYR 3ML LL SCALE MARK (SYRINGE) ×2 IMPLANT
TRAY CATH INTERMITTENT SS 16FR (CATHETERS) ×1 IMPLANT
TRAY FOLEY MTR SLVR 16FR STAT (SET/KITS/TRAYS/PACK) ×2 IMPLANT
WATER STERILE IRR 1000ML POUR (IV SOLUTION) ×4 IMPLANT
WRAP KNEE MAXI GEL POST OP (GAUZE/BANDAGES/DRESSINGS) ×2 IMPLANT

## 2020-12-19 NOTE — Op Note (Signed)
NAME:  [place] RECORD NO.:  [id number]                             FACILITY:  [facility]      PHYSICIAN:  Pietro Cassis. Alvan Dame, M.D.  DATE OF BIRTH:  [birth year]      DATE OF PROCEDURE:  12/19/2020                                     OPERATIVE REPORT         PREOPERATIVE DIAGNOSIS:  Left knee osteoarthritis.      POSTOPERATIVE DIAGNOSIS:  Left knee osteoarthritis.      FINDINGS:  The patient was noted to have complete loss of cartilage and   bone-on-bone arthritis with associated osteophytes in the medial and patellofemoral compartments of   the knee with a significant synovitis and associated effusion.  The patient had failed months of conservative treatment including medications, injection therapy, activity modification.     PROCEDURE:  Left total knee replacement.      COMPONENTS USED:  DePuy Attune rotating platform posterior stabilized knee   system, a size 4N femur, 3 tibia, size 8 mm PS AOX insert, and 35 anatomic patellar   button.      SURGEON:  Pietro Cassis. Alvan Dame, M.D.      ASSISTANT:  Danae Orleans, PA-C.      ANESTHESIA:  Regional and Spinal.      SPECIMENS:  None.      COMPLICATION:  None.      DRAINS:  None.  EBL: <100 cc      TOURNIQUET TIME:  29 min at 250 mmHg     The patient was stable to the recovery room.      INDICATION FOR PROCEDURE:  PEG FIFER is a 65 y.o. female patient of   mine.  The patient had been seen, evaluated, and treated for months conservatively in the   office with medication, activity modification, and injections.  The patient had   radiographic changes of bone-on-bone arthritis with endplate sclerosis and osteophytes noted.  Based on the radiographic changes and failed conservative measures, the patient   decided to proceed with definitive treatment, total knee replacement.  Risks of infection, DVT, component failure, need for revision surgery, neurovascular injury were reviewed in the office setting.   The postop course was reviewed stressing the efforts to maximize post-operative satisfaction and function.  Consent was obtained for benefit of pain   relief.      PROCEDURE IN DETAIL:  The patient was brought to the operative theater.   Once adequate anesthesia, preoperative antibiotics, 2 gm of Ancef,1 gm of Tranexamic Acid, and 10 mg of Decadron administered, the patient was positioned supine with a left thigh tourniquet placed.  The  left lower extremity was prepped and draped in sterile fashion.  A time-   out was performed identifying the patient, planned procedure, and the appropriate extremity.      The left lower extremity was placed in the [facility] leg holder.  The leg was   exsanguinated, tourniquet elevated to 250 mmHg.  A midline incision was   made followed by median parapatellar arthrotomy.  Following initial   exposure, attention was  first directed to the patella.  Precut   measurement was noted to be 20 mm.  I resected down to 13 mm and used a   35 anatomic patellar button to restore patellar height as well as cover the cut surface.      The lug holes were drilled and a metal shim was placed to protect the   patella from retractors and saw blade during the procedure.      At this point, attention was now directed to the femur.  The femoral   canal was opened with a drill, irrigated to try to prevent fat emboli.  An   intramedullary rod was passed at 3 degrees valgus, 9 mm of bone was   resected off the distal femur.  Following this resection, the tibia was   subluxated anteriorly.  Using the extramedullary guide, 2 mm of bone was resected off   the proximal medial tibia.  We confirmed the gap would be   stable medially and laterally with a size 5 spacer block as well as confirmed that the tibial cut was perpendicular in the coronal plane, checking with an alignment rod.      Once this was done, I sized the femur to be a size 4 in the anterior-   posterior dimension, chose a  narrow component based on medial and   lateral dimension.  The size 4 rotation block was then pinned in   position anterior referenced using the C-clamp to set rotation.  The   anterior, posterior, and  chamfer cuts were made without difficulty nor   notching making certain that I was along the anterior cortex to help   with flexion gap stability.      The final box cut was made off the lateral aspect of distal femur.      At this point, the tibia was sized to be a size 3.  The size 3 tray was   then pinned in position through the medial third of the tubercle,   drilled, and keel punched.  Trial reduction was now carried with a 4 femur,  3 tibia, a size 8 mm PS insert, and the 35 anatomic patella botton.  The knee was brought to full extension with good flexion stability with the patella   tracking through the trochlea without application of pressure.  Given   all these findings the trial components removed.  Final components were   opened and cement was mixed.  The knee was irrigated with normal saline solution and pulse lavage.  The synovial lining was   then injected with 30 cc of 0.25% Marcaine with epinephrine, 1 cc of Toradol and 30 cc of NS for a total of 61 cc.     Final implants were then cemented onto cleaned and dried cut surfaces of bone with the knee brought to extension with a size 8 mm PS trial insert.      Once the cement had fully cured, excess cement was removed   throughout the knee.  I confirmed that I was satisfied with the range of   motion and stability, and the final size 8 mm PS AOX insert was chosen.  It was   placed into the knee.      The tourniquet had been let down at 29 minutes.  No significant   hemostasis was required.  The extensor mechanism was then reapproximated using #1 Vicryl and #1 Stratafix sutures with the knee   in flexion.  The  remaining wound was closed with 2-0 Vicryl and running 4-0 Monocryl.   The knee was cleaned, dried, dressed  sterilely using Dermabond and   Aquacel dressing.  The patient was then   brought to recovery room in stable condition, tolerating the procedure   well.   Please note that Physician Assistant, Danae Orleans, PA-C was present for the entirety of the case, and was utilized for pre-operative positioning, peri-operative retractor management, general facilitation of the procedure and for primary wound closure at the end of the case.              Pietro Cassis Alvan Dame, M.D.    12/19/2020 12:54 PM

## 2020-12-19 NOTE — Anesthesia Postprocedure Evaluation (Signed)
Anesthesia Post Note  Patient: Erin Wolf  Procedure(s) Performed: TOTAL KNEE ARTHROPLASTY (Left Knee)     Patient location during evaluation: PACU Anesthesia Type: General Level of consciousness: awake and alert Pain management: pain level controlled Vital Signs Assessment: post-procedure vital signs reviewed and stable Respiratory status: spontaneous breathing, nonlabored ventilation, respiratory function stable and patient connected to nasal cannula oxygen Cardiovascular status: blood pressure returned to baseline and stable Postop Assessment: no apparent nausea or vomiting Anesthetic complications: no   No complications documented.  Last Vitals:  Vitals:   12/19/20 1630 12/19/20 1645  BP: 121/90 (!) 149/88  Pulse: 91 93  Resp: 12 13  Temp:    SpO2: 100% 100%    Last Pain:  Vitals:   12/19/20 1645  TempSrc:   PainSc: 9                  [name] S

## 2020-12-19 NOTE — Anesthesia Procedure Notes (Signed)
Procedure Name: Intubation Date/Time: 12/19/2020 2:20 PM Performed by: Sharlette Dense, CRNA Patient Re-evaluated:Patient Re-evaluated prior to induction Oxygen Delivery Method: Circle system utilized Preoxygenation: Pre-oxygenation with 100% oxygen Induction Type: IV induction Ventilation: Mask ventilation without difficulty and Oral airway inserted - appropriate to patient size Laryngoscope Size: Miller and 3 Grade View: Grade I Tube type: Oral Tube size: 7.5 mm Number of attempts: 1 Airway Equipment and Method: Stylet Placement Confirmation: ETT inserted through vocal cords under direct vision,  positive ETCO2 and breath sounds checked- equal and bilateral Secured at: 23 cm Tube secured with: Tape Dental Injury: Teeth and Oropharynx as per pre-operative assessment

## 2020-12-19 NOTE — Progress Notes (Signed)
Assisted Dr. Rose with left, ultrasound guided, adductor canal block. Side rails up, monitors on throughout procedure. See vital signs in flow sheet. Tolerated Procedure well.  

## 2020-12-19 NOTE — Discharge Instructions (Addendum)
INSTRUCTIONS AFTER JOINT REPLACEMENT  ° °o Remove items at home which could result in a fall. This includes throw rugs or furniture in walking pathways °o ICE to the affected joint every three hours while awake for 30 minutes at a time, for at least the first 3-5 days, and then as needed for pain and swelling.  Continue to use ice for pain and swelling. You may notice swelling that will progress down to the foot and ankle.  This is normal after surgery.  Elevate your leg when you are not up walking on it.   °o Continue to use the breathing machine you got in the hospital (incentive spirometer) which will help keep your temperature down.  It is common for your temperature to cycle up and down following surgery, especially at night when you are not up moving around and exerting yourself.  The breathing machine keeps your lungs expanded and your temperature down. ° ° °DIET:  As you were doing prior to hospitalization, we recommend a well-balanced diet. ° °DRESSING / WOUND CARE / SHOWERING ° °Keep the surgical dressing until follow up.  The dressing is water proof, so you can shower without any extra covering.  IF THE DRESSING FALLS OFF or the wound gets wet inside, change the dressing with sterile gauze.  Please use good hand washing techniques before changing the dressing.  Do not use any lotions or creams on the incision until instructed by your surgeon.   ° °ACTIVITY ° °o Increase activity slowly as tolerated, but follow the weight bearing instructions below.   °o No driving for 6 weeks or until further direction given by your physician.  You cannot drive while taking narcotics.  °o No lifting or carrying greater than 10 lbs. until further directed by your surgeon. °o Avoid periods of inactivity such as sitting longer than an hour when not asleep. This helps prevent blood clots.  °o You may return to work once you are authorized by your doctor.  ° ° ° °WEIGHT BEARING  ° °Weight bearing as tolerated with assist  device (walker, cane, etc) as directed, use it as long as suggested by your surgeon or therapist, typically at least 4-6 weeks. ° ° °EXERCISES ° °Results after joint replacement surgery are often greatly improved when you follow the exercise, range of motion and muscle strengthening exercises prescribed by your doctor. Safety measures are also important to protect the joint from further injury. Any time any of these exercises cause you to have increased pain or swelling, decrease what you are doing until you are comfortable again and then slowly increase them. If you have problems or questions, call your caregiver or physical therapist for advice.  ° °Rehabilitation is important following a joint replacement. After just a few days of immobilization, the muscles of the leg can become weakened and shrink (atrophy).  These exercises are designed to build up the tone and strength of the thigh and leg muscles and to improve motion. Often times heat used for twenty to thirty minutes before working out will loosen up your tissues and help with improving the range of motion but do not use heat for the first two weeks following surgery (sometimes heat can increase post-operative swelling).  ° °These exercises can be done on a training (exercise) mat, on the floor, on a table or on a bed. Use whatever works the best and is most comfortable for you.    Use music or television while you are exercising so that   the exercises are a pleasant break in your day. This will make your life better with the exercises acting as a break in your routine that you can look forward to.   Perform all exercises about fifteen times, three times per day or as directed.  You should exercise both the operative leg and the other leg as well. ° °Exercises include: °  °• Quad Sets - Tighten up the muscle on the front of the thigh (Quad) and hold for 5-10 seconds.   °• Straight Leg Raises - With your knee straight (if you were given a brace, keep it on),  lift the leg to 60 degrees, hold for 3 seconds, and slowly lower the leg.  Perform this exercise against resistance later as your leg gets stronger.  °• Leg Slides: Lying on your back, slowly slide your foot toward your buttocks, bending your knee up off the floor (only go as far as is comfortable). Then slowly slide your foot back down until your leg is flat on the floor again.  °• Angel Wings: Lying on your back spread your legs to the side as far apart as you can without causing discomfort.  °• Hamstring Strength:  Lying on your back, push your heel against the floor with your leg straight by tightening up the muscles of your buttocks.  Repeat, but this time bend your knee to a comfortable angle, and push your heel against the floor.  You may put a pillow under the heel to make it more comfortable if necessary.  ° °A rehabilitation program following joint replacement surgery can speed recovery and prevent re-injury in the future due to weakened muscles. Contact your doctor or a physical therapist for more information on knee rehabilitation.  ° ° °CONSTIPATION ° °Constipation is defined medically as fewer than three stools per week and severe constipation as less than one stool per week.  Even if you have a regular bowel pattern at home, your normal regimen is likely to be disrupted due to multiple reasons following surgery.  Combination of anesthesia, postoperative narcotics, change in appetite and fluid intake all can affect your bowels.  ° °YOU MUST use at least one of the following options; they are listed in order of increasing strength to get the job done.  They are all available over the counter, and you may need to use some, POSSIBLY even all of these options:   ° °Drink plenty of fluids (prune juice may be helpful) and high fiber foods °Colace 100 mg by mouth twice a day  °Senokot for constipation as directed and as needed Dulcolax (bisacodyl), take with full glass of water  °Miralax (polyethylene glycol)  once or twice a day as needed. ° °If you have tried all these things and are unable to have a bowel movement in the first 3-4 days after surgery call either your surgeon or your primary doctor.   ° °If you experience loose stools or diarrhea, hold the medications until you stool forms back up.  If your symptoms do not get better within 1 week or if they get worse, check with your doctor.  If you experience "the worst abdominal pain ever" or develop nausea or vomiting, please contact the office immediately for further recommendations for treatment. ° ° °ITCHING:  If you experience itching with your medications, try taking only a single pain pill, or even half a pain pill at a time.  You can also use Benadryl over the counter for itching or also to   help with sleep.   TED HOSE STOCKINGS:  Use stockings on both legs until for at least 2 weeks or as directed by physician office. They may be removed at night for sleeping.  MEDICATIONS:  See your medication summary on the After Visit Summary that nursing will review with you.  You may have some home medications which will be placed on hold until you complete the course of blood thinner medication.  It is important for you to complete the blood thinner medication as prescribed.  PRECAUTIONS:  If you experience chest pain or shortness of breath - call 911 immediately for transfer to the hospital emergency department.   If you develop a fever greater that 101 F, purulent drainage from wound, increased redness or drainage from wound, foul odor from the wound/dressing, or calf pain - CONTACT YOUR SURGEON.                                                   FOLLOW-UP APPOINTMENTS:  If you do not already have a post-op appointment, please call the office for an appointment to be seen by your surgeon.  Guidelines for how soon to be seen are listed in your After Visit Summary, but are typically between 1-4 weeks after surgery.  OTHER INSTRUCTIONS:   Knee  Replacement:  Do not place pillow under knee, focus on keeping the knee straight while resting.    DENTAL ANTIBIOTICS:  In most cases prophylactic antibiotics for Dental procdeures after total joint surgery are not necessary.  Exceptions are as follows:  1. History of prior total joint infection  2. Severely immunocompromised (Organ Transplant, cancer chemotherapy, Rheumatoid biologic meds such as Max)  3. Poorly controlled diabetes (A1C &gt; 8.0, blood glucose over 200)  If you have one of these conditions, contact your surgeon for an antibiotic prescription, prior to your dental procedure.   MAKE SURE YOU:   Understand these instructions.   Get help right away if you are not doing well or get worse.    Thank you for letting us be a part of your medical care team.  It is a privilege we respect greatly.  We hope these instructions will help you stay on track for a fast and full recovery!    ______________________________________________________________________________________________________________________________  Information on my medicine - XARELTO (Rivaroxaban)   Why was Xarelto prescribed for you? Xarelto was prescribed for you to reduce the risk of blood clots forming after orthopedic surgery. The medical term for these abnormal blood clots is venous thromboembolism (VTE).  What do you need to know about xarelto ? Take your Xarelto ONCE DAILY at the same time every day. You may take it either with or without food.  If you have difficulty swallowing the tablet whole, you may crush it and mix in applesauce just prior to taking your dose.  Take Xarelto exactly as prescribed by your doctor and DO NOT stop taking Xarelto without talking to the doctor who prescribed the medication.  Stopping without other VTE prevention medication to take the place of Xarelto may increase your risk of developing a clot.  After discharge, you should have regular check-up  appointments with your healthcare provider that is prescribing your Xarelto.    What do you do if you miss a dose? If you miss a dose, take it as soon as you remember  on the same day then continue your regularly scheduled once daily regimen the next day. Do not take two doses of Xarelto on the same day.   Important Safety Information A possible side effect of Xarelto is bleeding. You should call your healthcare provider right away if you experience any of the following: ? Bleeding from an injury or your nose that does not stop. ? Unusual colored urine (red or dark brown) or unusual colored stools (red or black). ? Unusual bruising for unknown reasons. ? A serious fall or if you hit your head (even if there is no bleeding).  Some medicines may interact with Xarelto and might increase your risk of bleeding while on Xarelto. To help avoid this, consult your healthcare provider or pharmacist prior to using any new prescription or non-prescription medications, including herbals, vitamins, non-steroidal anti-inflammatory drugs (NSAIDs) and supplements.  This website has more information on Xarelto: [email or web address]

## 2020-12-19 NOTE — Anesthesia Preprocedure Evaluation (Signed)
Anesthesia Evaluation  Patient identified by MRN, date of birth, ID band Patient awake    Reviewed: Allergy & Precautions, H&P , NPO status , Patient's Chart, lab work & pertinent test results  Airway Mallampati: II  TM Distance: >3 FB Neck ROM: Full    Dental no notable dental hx.    Pulmonary neg pulmonary ROS,    Pulmonary exam normal breath sounds clear to auscultation       Cardiovascular hypertension, Pt. on medications Normal cardiovascular exam Rhythm:Regular Rate:Normal     Neuro/Psych Seizures -, Well Controlled,  negative psych ROS   GI/Hepatic GERD  ,(+) Hepatitis -, C  Endo/Other  diabetesMorbid obesitylupus  Renal/GU   negative genitourinary   Musculoskeletal  (+) Arthritis , Osteoarthritis,    Abdominal (+) + obese,   Peds negative pediatric ROS (+)  Hematology negative hematology ROS (+)   Anesthesia Other Findings   Reproductive/Obstetrics negative OB ROS                             Anesthesia Physical Anesthesia Plan  ASA: III  Anesthesia Plan: General   Post-op Pain Management:  Regional for Post-op pain   Induction: Intravenous  PONV Risk Score and Plan: 3 and Ondansetron, Dexamethasone, Treatment may vary due to age or medical condition and Midazolam  Airway Management Planned: Oral ETT  Additional Equipment:   Intra-op Plan:   Post-operative Plan: Extubation in OR  Informed Consent: I have reviewed the patients History and Physical, chart, labs and discussed the procedure including the risks, benefits and alternatives for the proposed anesthesia with the patient or authorized representative who has indicated his/her understanding and acceptance.     Dental advisory given  Plan Discussed with: CRNA and Surgeon  Anesthesia Plan Comments: (Patient requests General anesthesia)        Anesthesia Quick Evaluation

## 2020-12-19 NOTE — Care Plan (Signed)
Ortho Bundle Case Management Note  Patient Details  Name: Erin Wolf MRN: [id number] Date of Birth: [birth year]                  L TKA on 12/19/20. DCP: Home with husband. 1 story home with 4 steps. DME: RW & 3in1 ordered through [place]. PT: EO 3/4   DME Arranged:  3-N-1,Walker rolling DME Agency:  [employer]  HH Arranged:    HH Agency:     Additional Comments: Please contact me with any questions of if this plan should need to change.  Marianne Sofia, RN,CCM [facility]  [phone] 12/19/2020, 1:12 PM

## 2020-12-19 NOTE — Interval H&P Note (Signed)
History and Physical Interval Note:  12/19/2020 12:50 PM  Erin Wolf  has presented today for surgery, with the diagnosis of Left knee osteoarthritis.  The various methods of treatment have been discussed with the patient and family. After consideration of risks, benefits and other options for treatment, the patient has consented to  Procedure(s) with comments: TOTAL KNEE ARTHROPLASTY (Left) - 70 mins as a surgical intervention.  The patient's history has been reviewed, patient examined, no change in status, stable for surgery.  I have reviewed the patient's chart and labs.  Questions were answered to the patient's satisfaction.     Mauri Pole

## 2020-12-19 NOTE — Anesthesia Procedure Notes (Signed)
Anesthesia Regional Block: Adductor canal block   Pre-Anesthetic Checklist: ,, timeout performed, Correct Patient, Correct Site, Correct Laterality, Correct Procedure, Correct Position, site marked, Risks and benefits discussed,  Surgical consent,  Pre-op evaluation,  At surgeon's request and post-op pain management  Laterality: Left  Prep: chloraprep       Needles:  Injection technique: Single-shot  Needle Type: Echogenic Needle     Needle Length: 9cm      Additional Needles:   Procedures:,,,, ultrasound used (permanent image in chart),,,,  Narrative:  Start time: 12/19/2020 1:00 PM End time: 12/19/2020 1:08 PM Injection made incrementally with aspirations every 5 mL.  Performed by: Personally  Anesthesiologist: Myrtie Soman, MD  Additional Notes: Patient tolerated the procedure well without complications

## 2020-12-19 NOTE — Transfer of Care (Signed)
Immediate Anesthesia Transfer of Care Note  Patient: Erin Wolf  Procedure(s) Performed: TOTAL KNEE ARTHROPLASTY (Left Knee)  Patient Location: PACU  Anesthesia Type:GA combined with regional for post-op pain  Level of Consciousness: awake and alert   Airway & Oxygen Therapy: Patient Spontanous Breathing and Patient connected to face mask oxygen  Post-op Assessment: Report given to RN, Post -op Vital signs reviewed and stable and patient complaining of pain to leg.  Post vital signs: Reviewed and stable  Last Vitals:  Vitals Value Taken Time  BP    Temp    Pulse    Resp    SpO2      Last Pain:  Vitals:   12/19/20 1252  TempSrc:   PainSc: 5       Patients Stated Pain Goal: 5 (37/34/28 7681)  Complications: No complications documented.

## 2020-12-20 DIAGNOSIS — Z96652 Presence of left artificial knee joint: Secondary | ICD-10-CM | POA: Diagnosis not present

## 2020-12-20 DIAGNOSIS — Z91040 Latex allergy status: Secondary | ICD-10-CM | POA: Diagnosis not present

## 2020-12-20 DIAGNOSIS — I10 Essential (primary) hypertension: Secondary | ICD-10-CM | POA: Diagnosis not present

## 2020-12-20 DIAGNOSIS — Z880 Allergy status to penicillin: Secondary | ICD-10-CM | POA: Diagnosis not present

## 2020-12-20 DIAGNOSIS — E119 Type 2 diabetes mellitus without complications: Secondary | ICD-10-CM | POA: Diagnosis not present

## 2020-12-20 DIAGNOSIS — M1712 Unilateral primary osteoarthritis, left knee: Secondary | ICD-10-CM | POA: Diagnosis not present

## 2020-12-20 DIAGNOSIS — Z79899 Other long term (current) drug therapy: Secondary | ICD-10-CM | POA: Diagnosis not present

## 2020-12-20 LAB — CBC
HCT: 34.7 % — ABNORMAL LOW (ref 36.0–46.0)
Hemoglobin: 10.7 g/dL — ABNORMAL LOW (ref 12.0–15.0)
MCH: 26.2 pg (ref 26.0–34.0)
MCHC: 30.8 g/dL (ref 30.0–36.0)
MCV: 84.8 fL (ref 80.0–100.0)
Platelets: 271 10*3/uL (ref 150–400)
RBC: 4.09 MIL/uL (ref 3.87–5.11)
RDW: 15.3 % (ref 11.5–15.5)
WBC: 11.1 10*3/uL — ABNORMAL HIGH (ref 4.0–10.5)
nRBC: 0 % (ref 0.0–0.2)

## 2020-12-20 LAB — BASIC METABOLIC PANEL
Anion gap: 7 (ref 5–15)
BUN: 10 mg/dL (ref 8–23)
CO2: 30 mmol/L (ref 22–32)
Calcium: 8.4 mg/dL — ABNORMAL LOW (ref 8.9–10.3)
Chloride: 99 mmol/L (ref 98–111)
Creatinine, Ser: 0.62 mg/dL (ref 0.44–1.00)
GFR, Estimated: >60 mL/min (ref >60)
Glucose, Bld: 137 mg/dL — ABNORMAL HIGH (ref 70–99)
Potassium: 3.9 mmol/L (ref 3.5–5.1)
Sodium: 136 mmol/L (ref 135–145)

## 2020-12-20 MED ORDER — FERROUS SULFATE 325 (65 FE) MG PO TABS: 325.0000 mg | ORAL_TABLET | Freq: Three times a day (TID) | ORAL | 0 refills | Status: DC

## 2020-12-20 MED ORDER — RIVAROXABAN 10 MG PO TABS: 10.0000 mg | ORAL_TABLET | Freq: Every day | ORAL | 0 refills | Status: DC

## 2020-12-20 MED ORDER — DOCUSATE SODIUM 100 MG PO CAPS: 100.0000 mg | ORAL_CAPSULE | Freq: Two times a day (BID) | ORAL | 0 refills | Status: DC

## 2020-12-20 MED ORDER — POLYETHYLENE GLYCOL 3350 17 G PO PACK: 17.0000 g | PACK | Freq: Two times a day (BID) | ORAL | 0 refills | Status: DC

## 2020-12-20 MED ORDER — OXYCODONE HCL 10 MG PO TABS: 10.0000 mg | ORAL_TABLET | ORAL | 0 refills | Status: DC | PRN

## 2020-12-20 NOTE — Discharge Summary (Signed)
Patient ID: Erin Wolf MRN: [id number] DOB/AGE: Apr 22, 1956 65 y.o.  Admit date: 12/19/2020 Discharge date: 12/20/2020  Admission Diagnoses:  Principal Problem:   Osteoarthritis of left knee Active Problems:   Status post total left knee replacement   Discharge Diagnoses:  Same  Past Medical History:  Diagnosis Date  . Anxiety   . Arthritis   . Chronic abdominal pain   . Chronic lower back pain   . Chronic nausea   . Depression   . Diabetes mellitus without complication ([place])   . Diverticulosis   . Fibromyalgia   . Frequency of urination   . GERD (gastroesophageal reflux disease)   . Headache   . Hematuria   . Hepatitis C antibody positive in blood    per pt told by pcp  . History of panic attacks   . History of syncope    hx recurrent syncope --- per epic domentation non-cardiac , orthostatic hypotension, anxiety, dehydration  . History of uterine fibroid   . HTN (hypertension), benign   . Hyperlipidemia 01/03/2012  . IBS (irritable bowel syndrome)   . Myalgia   . Seizure disorder ([place]) followed by pcp until new neurologist appr. in jan 2019   "started having them in my 20's" ,  petit mal ----  per pt last seizure one 2015  . SLE (systemic lupus erythematosus) [facility])    rheumatologist-- dr Gerilyn Nestle (consult 11-12-2016) having work-up done  . Ulcerative colitis    followed by dr Penelope Coop at [place]  . Wears glasses     Surgeries: Procedure(s): LEFT TOTAL KNEE ARTHROPLASTY on 12/19/2020   Consultants: N/A  Discharged Condition: Improved  Hospital Course: Erin Wolf is an 65 y.o. female who was admitted 12/19/2020 for operative treatment ofOsteoarthritis of left knee. Patient has severe unremitting pain that affects sleep, daily activities, and work/hobbies. After pre-op clearance the patient was taken to the operating room on 12/19/2020 and underwent  Procedure(s): LEFT TOTAL KNEE ARTHROPLASTY.    Patient was given perioperative antibiotics:  Anti-infectives  (From admission, onward)   Start     Dose/Rate Route Frequency Ordered Stop   12/19/20 2000  ceFAZolin (ANCEF) IVPB 2g/100 mL premix        2 g 200 mL/hr over 30 Minutes Intravenous Every 6 hours 12/19/20 1749 12/20/20 0247   12/19/20 1200  ceFAZolin (ANCEF) IVPB 2g/100 mL premix        2 g 200 mL/hr over 30 Minutes Intravenous On call to O.R. 12/19/20 1155 12/19/20 1431       Patient was given sequential compression devices, early ambulation, and chemoprophylaxis to prevent DVT.  Patient benefited maximally from hospital stay and there were no complications.    Recent vital signs:  Patient Vitals for the past 24 hrs:  BP Temp Temp src Pulse Resp SpO2 Height Weight  12/20/20 0515 107/65 98.1 F (36.7 C) Oral 82 16 98 % -- --  12/20/20 0202 123/77 (!) 97.2 F (36.2 C) Oral 76 16 99 % -- --  12/19/20 2113 (!) 145/88 98.2 F (36.8 C) Oral 93 16 100 % -- --  12/19/20 2003 (!) 148/90 (!) 97.5 F (36.4 C) Oral 91 16 100 % -- --  12/19/20 1856 (!) 151/88 (!) 97.5 F (36.4 C) Oral 80 16 100 % -- --  12/19/20 1802 (!) 148/84 97.9 F (36.6 C) Oral 92 16 100 % -- --  12/19/20 1730 (!) 154/101 98 F (36.7 C) -- 97 10 100 % -- --  12/19/20  1715 (!) 163/90 -- -- 93 12 100 % -- --  12/19/20 1700 (!) 168/98 -- -- 97 (!) 23 100 % -- --  12/19/20 1645 (!) 149/88 -- -- 93 13 100 % -- --  12/19/20 1630 121/90 -- -- 91 12 100 % -- --  12/19/20 1618 134/89 98.1 F (36.7 C) -- 91 16 100 % -- --  12/19/20 1310 -- -- -- 85 11 100 % -- --  12/19/20 1305 (!) 162/84 -- -- 80 14 100 % -- --  12/19/20 1300 -- -- -- 79 13 100 % -- --  12/19/20 1252 -- -- -- -- -- -- 5\' 5"  (1.651 m) 104.8 kg  12/19/20 1215 138/80 98.6 F (37 C) Oral 82 20 100 % -- --     Recent laboratory studies:  Recent Labs    12/20/20 0317  WBC 11.1*  HGB 10.7*  HCT 34.7*  PLT 271  NA 136  K 3.9  CL 99  CO2 30  BUN 10  CREATININE 0.62  GLUCOSE 137*  CALCIUM 8.4*     Discharge Medications:   Allergies as of  12/20/2020      Reactions   Latex Other (See Comments)   Peels skin   Nsaids Other (See Comments)   HAS COLITIS FLARES   Penicillins Hives   Tolerated Cephalosporin Date: 12/19/20.   Has patient had a PCN reaction causing immediate rash, facial/tongue/throat swelling, SOB or lightheadedness with hypotension: Yes Has patient had a PCN reaction causing severe rash involving mucus membranes or skin necrosis: Unknown Has patient had a PCN reaction that required hospitalization: no Has patient had a PCN reaction occurring within the last 10 years: No If all of the above answers are "NO", then may proceed with Cephalosporin use.   Wellbutrin [bupropion Hcl] Other (See Comments)   Insomnia and headaches   Aspirin Nausea Only   Darvocet [propoxyphene N-acetaminophen] Nausea And Vomiting   Isometheptene-dichloral-apap Nausea Only   IV dye?   Tape Hives   Toradol [ketorolac Tromethamine] Nausea And Vomiting   Tylenol [acetaminophen] Nausea Only   Milk-related Compounds Diarrhea   Other    Spicy foods cause diarrhea due to colitis      Medication List    TAKE these medications   acetaminophen 500 MG tablet Commonly known as: TYLENOL Take 500 mg by mouth every 6 (six) hours as needed for moderate pain.   cyclobenzaprine 10 MG tablet Commonly known as: FLEXERIL Take 10 mg by mouth as needed for muscle spasms.   diazepam 5 MG tablet Commonly known as: VALIUM Take 5 mg by mouth 2 (two) times daily as needed for anxiety.   docusate sodium 100 MG capsule Commonly known as: Colace Take 1 capsule (100 mg total) by mouth 2 (two) times daily.   esomeprazole 40 MG capsule Commonly known as: NEXIUM Take 40 mg by mouth daily as needed (acid reflux).   ferrous sulfate 325 (65 FE) MG tablet Commonly known as: FerrouSul Take 1 tablet (325 mg total) by mouth 3 (three) times daily with meals for 14 days.   FLUoxetine 20 MG capsule Commonly known as: PROZAC Take 20 mg by mouth daily.    furosemide 20 MG tablet Commonly known as: LASIX Take 20 mg by mouth daily as needed for edema.   hydrOXYzine 25 MG tablet Commonly known as: ATARAX/VISTARIL Take 25 mg by mouth at bedtime as needed for sleep.   neomycin-polymyxin-hydrocortisone 3.5-10000-1 OTIC suspension Commonly known as: CORTISPORIN Place 2-5 drops  into the right ear daily as needed (vertigo).   Oxycodone HCl 10 MG Tabs Take 1-2 tablets (10-20 mg total) by mouth every 4 (four) hours as needed for moderate pain or severe pain. What changed:   when to take this  reasons to take this   polyethylene glycol 17 g packet Commonly known as: MIRALAX / GLYCOLAX Take 17 g by mouth 2 (two) times daily. What changed: when to take this   rivaroxaban 10 MG Tabs tablet Commonly known as: Xarelto Take 1 tablet (10 mg total) by mouth daily.   rosuvastatin 10 MG tablet Commonly known as: CRESTOR Take 10 mg by mouth every Monday.   SYSTANE OP Place 1 drop into both eyes 2 (two) times daily.   triamterene-hydrochlorothiazide 75-50 MG tablet Commonly known as: MAXZIDE Take 1 tablet by mouth every morning.   WOMENS MULTI PO Take 1 tablet by mouth daily.            Discharge Care Instructions  (From admission, onward)         Start     Ordered   12/20/20 0000  Change dressing       Comments: Maintain surgical dressing until follow up in the clinic. If the edges start to pull up, may reinforce with tape. If the dressing is no longer working, may remove and cover with gauze and tape, but must keep the area dry and clean.  Call with any questions or concerns.   12/20/20 0902          Diagnostic Studies: N/A  Disposition: Home  Discharge Instructions    Call MD / Call 911   Complete by: As directed    If you experience chest pain or shortness of breath, CALL 911 and be transported to the hospital emergency room.  If you develope a fever above 101 F, pus (white drainage) or increased drainage or redness  at the wound, or calf pain, call your surgeon's office.   Change dressing   Complete by: As directed    Maintain surgical dressing until follow up in the clinic. If the edges start to pull up, may reinforce with tape. If the dressing is no longer working, may remove and cover with gauze and tape, but must keep the area dry and clean.  Call with any questions or concerns.   Constipation Prevention   Complete by: As directed    Drink plenty of fluids.  Prune juice may be helpful.  You may use a stool softener, such as Colace (over the counter) 100 mg twice a day.  Use MiraLax (over the counter) for constipation as needed.   Diet - low sodium heart healthy   Complete by: As directed    Discharge instructions   Complete by: As directed    Maintain surgical dressing until follow up in the clinic. If the edges start to pull up, may reinforce with tape. If the dressing is no longer working, may remove and cover with gauze and tape, but must keep the area dry and clean.  Follow up in 2 weeks at [facility]. Call with any questions or concerns.   Increase activity slowly as tolerated   Complete by: As directed    Weight bearing as tolerated with assist device (walker, cane, etc) as directed, use it as long as suggested by your surgeon or therapist, typically at least 4-6 weeks.   TED hose   Complete by: As directed    Use stockings (TED hose) for 2 weeks  on both leg(s).  You may remove them at night for sleeping.       Follow-up Information    Rosilyn Mings.. Go on 12/22/2020.   Why: You are scheduled for physical therapy eval on Friday March 4th at 11:00am. Contact information: [place] [address] [phone]        Paralee Cancel, MD. Go on 01/03/2021.   Specialty: Orthopedic Surgery Why: You are scheduled for first post op appointment with Dr Alvan Dame on Wednesday March 16th at 2:45pm.  Contact information: [address] [place]  [address] [phone]                Signed: Lucille Passy [facility] 12/20/2020, 9:02 AM

## 2020-12-20 NOTE — Progress Notes (Signed)
Nutrition Brief Note  Patient identified on the Malnutrition Screening Tool (MST) Report  Wt Readings from Last 15 Encounters:  12/19/20 104.8 kg  12/15/20 104.8 kg  11/25/20 99.8 kg  05/05/20 99.8 kg  04/08/20 95.3 kg  07/13/19 104.3 kg  04/20/19 102.6 kg  11/10/18 103.4 kg  12/31/17 102.1 kg  08/01/17 106.6 kg  07/23/17 108.4 kg  06/06/17 102.1 kg  04/10/17 102.1 kg  04/08/17 102.1 kg  07/01/16 108.7 kg    Body mass index is 38.44 kg/m. Patient meets criteria for obesity based on current BMI.   Patient admitted for osteoarthritis of L knee and is POD #1 total L knee replacement.  Discharge order and discharge summary entered earlier this AM.   Current diet order is Regular. Labs and medications reviewed.   No nutrition interventions warranted at this time. If nutrition issues arise, please consult RD.     Jarome Matin, MS, RD, LDN, CNSC Inpatient Clinical Dietitian RD pager # available in [place]  After hours/weekend pager # available in [facility]

## 2020-12-20 NOTE — Progress Notes (Signed)
     Subjective: 1 Day Post-Op Procedure(s) (LRB): TOTAL KNEE ARTHROPLASTY (Left)   Patient reports pain as mild, pain controlled. No reported events throughout the night.  Dr. Alvan Dame discussed the procedure, findings and expectations moving forward.  Ready to be discharged home, if they do well with PT.  Follow up in the clinic in 2 weeks.  Knows to call with any questions or concerns.     Patient's anticipated LOS is less than 2 midnights, meeting these requirements: - Younger than 65 - Lives within 1 hour of care - Has a competent adult at home to recover with post-op recover - NO history of  - Chronic pain requiring opiods  - Diabetes  - Coronary Artery Disease  - Heart failure  - Heart attack  - Stroke  - DVT/VTE  - Cardiac arrhythmia  - Respiratory Failure/COPD  - Renal failure  - Anemia  - Advanced Liver disease       Objective:   VITALS:   Vitals:   12/20/20 0202 12/20/20 0515  BP: 123/77 107/65  Pulse: 76 82  Resp: 16 16  Temp: (!) 97.2 F (36.2 C) 98.1 F (36.7 C)  SpO2: 99% 98%    Dorsiflexion/Plantar flexion intact Incision: dressing C/D/I No cellulitis present Compartment soft  LABS Recent Labs    12/20/20 0317  HGB 10.7*  HCT 34.7*  WBC 11.1*  PLT 271    Recent Labs    12/20/20 0317  NA 136  K 3.9  BUN 10  CREATININE 0.62  GLUCOSE 137*     Assessment/Plan: 1 Day Post-Op Procedure(s) (LRB): TOTAL KNEE ARTHROPLASTY (Left)   Advance diet Up with therapy D/C IV fluids Discharge home  Follow up in 2 weeks at [facility] Follow up with OLIN,Meloney Feld D in 2 weeks.  Contact information:  [facility] [address] [phone]    Obese (BMI 30-39.9) Estimated body mass index is 38.44 kg/m as calculated from the following:   Height as of this encounter: 5\' 5"  (1.651 m).   Weight as of this encounter: 104.8 kg. Patient also counseled that weight may inhibit the healing  process Patient counseled that losing weight will help with future health issues      Danae Orleans PA-C  [facility]  [place] [address], [place], [place] [address] Phone: [phone] [email or web address] Facebook  [employer]

## 2020-12-20 NOTE — Evaluation (Signed)
Physical Therapy Evaluation Patient Details Name: Erin Wolf MRN: [id number] DOB: 09/28/1956 Today's Date: 12/20/2020   History of Present Illness  64 y.o. female admitted 12/19/20 for L TKA. PMH includes L sided weakness, SLE, depression, anxiety, seizures, fibromyalgia, HTN, chronic back and abdominal pain  Clinical Impression  Pt ambulated 140' with RW, completed stair training, and demonstrates good understanding of HEP. She is ready to DC home from PT standpoint.     Follow Up Recommendations Follow surgeon's recommendation for DC plan and follow-up therapies    Equipment Recommendations  Rolling walker with 5" wheels    Recommendations for Other Services       Precautions / Restrictions Precautions Precautions: Knee Precaution Booklet Issued: Yes (comment) Precaution Comments: reviewed no pillow under kne Restrictions Weight Bearing Restrictions: No LLE Weight Bearing: Weight bearing as tolerated      Mobility  Bed Mobility Overal bed mobility: Modified Independent             General bed mobility comments: HOB up    Transfers Overall transfer level: Needs assistance   Transfers: Sit to/from Stand Sit to Stand: Min guard         General transfer comment: VCs hand placement  Ambulation/Gait Ambulation/Gait assistance: Supervision Gait Distance (Feet): 140 Feet Assistive device: Rolling walker (2 wheeled) Gait Pattern/deviations: Step-to pattern;Decreased step length - right;Decreased step length - left Gait velocity: decr   General Gait Details: 110' + 30' with RW, VCs sequencing initially, distance limited by pain  Stairs Stairs: Yes Stairs assistance: Min assist Stair Management: Two rails;Forwards;Step to pattern Number of Stairs: 3 General stair comments: VCs sequencing, min A to manage RW  Wheelchair Mobility    Modified Rankin (Stroke Patients Only)       Balance Overall balance assessment: Modified Independent                                            Pertinent Vitals/Pain Pain Assessment: 0-10 Pain Score: 10-Worst pain ever Pain Location: L knee with walking Pain Descriptors / Indicators: Sore;Grimacing;Guarding;Crying Pain Intervention(s): Limited activity within patient's tolerance;Monitored during session;Premedicated before session;RN gave pain meds during session;Ice applied    Home Living Family/patient expects to be discharged to:: Private residence Living Arrangements: Spouse/significant other Available Help at Discharge: Family Type of Home: House Home Access: Stairs to enter Entrance Stairs-Rails: Right;Left;Can reach both Entrance Stairs-Number of Steps: 4 Home Layout: One level Home Equipment: Walker - 2 wheels;Walker - 4 wheels;Cane - single point;Bedside commode      Prior Function Level of Independence: Independent with assistive device(s)         Comments: walked with SPC     Hand Dominance        Extremity/Trunk Assessment   Upper Extremity Assessment Upper Extremity Assessment: Overall WFL for tasks assessed    Lower Extremity Assessment Lower Extremity Assessment: LLE deficits/detail LLE Deficits / Details: SLR +2/5, knee AAROM 5-60*, knee ext -3/5 LLE Sensation: WNL LLE Coordination: WNL    Cervical / Trunk Assessment Cervical / Trunk Assessment: Normal  Communication   Communication: No difficulties  Cognition Arousal/Alertness: Awake/alert Behavior During Therapy: WFL for tasks assessed/performed Overall Cognitive Status: Within Functional Limits for tasks assessed                                          General Comments      Exercises Total Joint Exercises Ankle Circles/Pumps: AROM;Both;10 reps;Supine Quad Sets: AROM;Left;5 reps;Supine Short Arc Quad: AAROM;Left;5 reps;Supine Heel Slides: AAROM;Left;5 reps;Supine Hip ABduction/ADduction: AAROM;Left;5 reps;Supine Straight Leg Raises: AAROM;Left;5  reps;Supine Long Arc Quad: AROM;Left;5 reps;Seated Knee Flexion: AAROM;Left;5 reps;Seated Goniometric ROM: 5-60* AAROM L knee   Assessment/Plan    PT Assessment All further PT needs can be met in the next venue of care  PT Problem List Decreased mobility;Decreased range of motion;Decreased activity tolerance;Pain       PT Treatment Interventions      PT Goals (Current goals can be found in the Care Plan section)  Acute Rehab PT Goals Patient Stated Goal: decrease pain, walk farther PT Goal Formulation: All assessment and education complete, DC therapy    Frequency     Barriers to discharge        Co-evaluation               AM-PAC PT "6 Clicks" Mobility  Outcome Measure Help needed turning from your back to your side while in a flat bed without using bedrails?: A Little Help needed moving from lying on your back to sitting on the side of a flat bed without using bedrails?: A Little Help needed moving to and from a bed to a chair (including a wheelchair)?: A Little Help needed standing up from a chair using your arms (e.g., wheelchair or bedside chair)?: A Little Help needed to walk in hospital room?: A Little Help needed climbing 3-5 steps with a railing? : A Little 6 Click Score: 18    End of Session Equipment Utilized During Treatment: Gait belt Activity Tolerance: Patient tolerated treatment well Patient left: in chair;with call bell/phone within reach;with chair alarm set Nurse Communication: Mobility status PT Visit Diagnosis: Difficulty in walking, not elsewhere classified (R26.2);Pain Pain - Right/Left: Left Pain - part of body: Knee    Time: 1118-1205 PT Time Calculation (min) (ACUTE ONLY): 47 min   Charges:   PT Evaluation $PT Eval Low Complexity: 1 Low PT Treatments $Gait Training: 8-22 mins $Therapeutic Exercise: 8-22 mins        ,  Kistler PT 12/20/2020  Acute Rehabilitation Services Pager [phone] Office  [phone]   

## 2020-12-20 NOTE — TOC Transition Note (Signed)
Transition of Care [facility]) - CM/SW Discharge Note   Patient Details  Name: Erin Wolf MRN: [id number] Date of Birth: 1956-09-25  Transition of Care [facility]) CM/SW Contact:  Lia Hopping, [place] Phone Number: 12/20/2020, 10:08 AM   Clinical Narrative:    [facility] plan of care, See Ortho. CM note.  RW and 3 in1 was delivered to the patient bedside by [employer]  Final next level of care: OP Rehab     Patient Goals and CMS Choice        Discharge Placement                      Discharge Plan and Services                DME Arranged: 3-N-1,Walker rolling DME Agency: [facility]                 Social Determinants of Health (SDOH) Interventions     Readmission Risk Interventions No flowsheet data found.

## 2020-12-21 ENCOUNTER — Encounter (HOSPITAL_COMMUNITY): Payer: Self-pay | Admitting: Orthopedic Surgery

## 2020-12-25 DIAGNOSIS — M25562 Pain in left knee: Secondary | ICD-10-CM | POA: Diagnosis not present

## 2021-01-01 DIAGNOSIS — M25562 Pain in left knee: Secondary | ICD-10-CM | POA: Diagnosis not present

## 2021-01-10 DIAGNOSIS — G43001 Migraine without aura, not intractable, with status migrainosus: Secondary | ICD-10-CM | POA: Diagnosis not present

## 2021-01-10 DIAGNOSIS — I10 Essential (primary) hypertension: Secondary | ICD-10-CM | POA: Diagnosis not present

## 2021-01-10 DIAGNOSIS — G8929 Other chronic pain: Secondary | ICD-10-CM | POA: Diagnosis not present

## 2021-01-10 DIAGNOSIS — E1169 Type 2 diabetes mellitus with other specified complication: Secondary | ICD-10-CM | POA: Diagnosis not present

## 2021-01-10 DIAGNOSIS — F324 Major depressive disorder, single episode, in partial remission: Secondary | ICD-10-CM | POA: Diagnosis not present

## 2021-01-10 DIAGNOSIS — H269 Unspecified cataract: Secondary | ICD-10-CM | POA: Diagnosis not present

## 2021-01-10 DIAGNOSIS — M1712 Unilateral primary osteoarthritis, left knee: Secondary | ICD-10-CM | POA: Diagnosis not present

## 2021-01-10 DIAGNOSIS — M150 Primary generalized (osteo)arthritis: Secondary | ICD-10-CM | POA: Diagnosis not present

## 2021-01-10 DIAGNOSIS — E7800 Pure hypercholesterolemia, unspecified: Secondary | ICD-10-CM | POA: Diagnosis not present

## 2021-01-12 DIAGNOSIS — M25562 Pain in left knee: Secondary | ICD-10-CM | POA: Diagnosis not present

## 2021-01-15 DIAGNOSIS — M25562 Pain in left knee: Secondary | ICD-10-CM | POA: Diagnosis not present

## 2021-01-18 DIAGNOSIS — M25562 Pain in left knee: Secondary | ICD-10-CM | POA: Diagnosis not present

## 2021-01-22 DIAGNOSIS — M25562 Pain in left knee: Secondary | ICD-10-CM | POA: Diagnosis not present

## 2021-01-24 DIAGNOSIS — M170 Bilateral primary osteoarthritis of knee: Secondary | ICD-10-CM | POA: Diagnosis not present

## 2021-01-24 DIAGNOSIS — Z0000 Encounter for general adult medical examination without abnormal findings: Secondary | ICD-10-CM | POA: Diagnosis not present

## 2021-01-24 DIAGNOSIS — G8929 Other chronic pain: Secondary | ICD-10-CM | POA: Diagnosis not present

## 2021-01-24 DIAGNOSIS — F419 Anxiety disorder, unspecified: Secondary | ICD-10-CM | POA: Diagnosis not present

## 2021-01-24 DIAGNOSIS — G43001 Migraine without aura, not intractable, with status migrainosus: Secondary | ICD-10-CM | POA: Diagnosis not present

## 2021-01-24 DIAGNOSIS — E7800 Pure hypercholesterolemia, unspecified: Secondary | ICD-10-CM | POA: Diagnosis not present

## 2021-01-24 DIAGNOSIS — E1169 Type 2 diabetes mellitus with other specified complication: Secondary | ICD-10-CM | POA: Diagnosis not present

## 2021-01-24 DIAGNOSIS — I10 Essential (primary) hypertension: Secondary | ICD-10-CM | POA: Diagnosis not present

## 2021-01-24 DIAGNOSIS — F324 Major depressive disorder, single episode, in partial remission: Secondary | ICD-10-CM | POA: Diagnosis not present

## 2021-01-24 DIAGNOSIS — K5190 Ulcerative colitis, unspecified, without complications: Secondary | ICD-10-CM | POA: Diagnosis not present

## 2021-01-25 DIAGNOSIS — M25562 Pain in left knee: Secondary | ICD-10-CM | POA: Diagnosis not present

## 2021-01-30 DIAGNOSIS — M25562 Pain in left knee: Secondary | ICD-10-CM | POA: Diagnosis not present

## 2021-01-31 DIAGNOSIS — Z471 Aftercare following joint replacement surgery: Secondary | ICD-10-CM | POA: Diagnosis not present

## 2021-01-31 DIAGNOSIS — Z96652 Presence of left artificial knee joint: Secondary | ICD-10-CM | POA: Diagnosis not present

## 2021-01-31 DIAGNOSIS — M1711 Unilateral primary osteoarthritis, right knee: Secondary | ICD-10-CM | POA: Diagnosis not present

## 2021-01-31 DIAGNOSIS — M25561 Pain in right knee: Secondary | ICD-10-CM | POA: Diagnosis not present

## 2021-02-16 DIAGNOSIS — F324 Major depressive disorder, single episode, in partial remission: Secondary | ICD-10-CM | POA: Diagnosis not present

## 2021-02-16 DIAGNOSIS — E7800 Pure hypercholesterolemia, unspecified: Secondary | ICD-10-CM | POA: Diagnosis not present

## 2021-02-16 DIAGNOSIS — I10 Essential (primary) hypertension: Secondary | ICD-10-CM | POA: Diagnosis not present

## 2021-02-16 DIAGNOSIS — H269 Unspecified cataract: Secondary | ICD-10-CM | POA: Diagnosis not present

## 2021-02-16 DIAGNOSIS — G8929 Other chronic pain: Secondary | ICD-10-CM | POA: Diagnosis not present

## 2021-02-16 DIAGNOSIS — G43001 Migraine without aura, not intractable, with status migrainosus: Secondary | ICD-10-CM | POA: Diagnosis not present

## 2021-02-16 DIAGNOSIS — E1169 Type 2 diabetes mellitus with other specified complication: Secondary | ICD-10-CM | POA: Diagnosis not present

## 2021-02-23 DIAGNOSIS — H2513 Age-related nuclear cataract, bilateral: Secondary | ICD-10-CM | POA: Diagnosis not present

## 2021-02-23 DIAGNOSIS — H5712 Ocular pain, left eye: Secondary | ICD-10-CM | POA: Diagnosis not present

## 2021-02-23 DIAGNOSIS — H43812 Vitreous degeneration, left eye: Secondary | ICD-10-CM | POA: Diagnosis not present

## 2021-02-23 DIAGNOSIS — R519 Headache, unspecified: Secondary | ICD-10-CM | POA: Diagnosis not present

## 2021-03-12 DIAGNOSIS — M1712 Unilateral primary osteoarthritis, left knee: Secondary | ICD-10-CM | POA: Diagnosis not present

## 2021-03-12 DIAGNOSIS — M150 Primary generalized (osteo)arthritis: Secondary | ICD-10-CM | POA: Diagnosis not present

## 2021-03-12 DIAGNOSIS — H269 Unspecified cataract: Secondary | ICD-10-CM | POA: Diagnosis not present

## 2021-03-12 DIAGNOSIS — F324 Major depressive disorder, single episode, in partial remission: Secondary | ICD-10-CM | POA: Diagnosis not present

## 2021-03-12 DIAGNOSIS — E7800 Pure hypercholesterolemia, unspecified: Secondary | ICD-10-CM | POA: Diagnosis not present

## 2021-03-12 DIAGNOSIS — I10 Essential (primary) hypertension: Secondary | ICD-10-CM | POA: Diagnosis not present

## 2021-03-12 DIAGNOSIS — G43001 Migraine without aura, not intractable, with status migrainosus: Secondary | ICD-10-CM | POA: Diagnosis not present

## 2021-03-12 DIAGNOSIS — E1169 Type 2 diabetes mellitus with other specified complication: Secondary | ICD-10-CM | POA: Diagnosis not present

## 2021-03-12 DIAGNOSIS — G8929 Other chronic pain: Secondary | ICD-10-CM | POA: Diagnosis not present

## 2021-03-20 NOTE — Patient Instructions (Addendum)
DUE TO COVID-19 ONLY ONE VISITOR IS ALLOWED TO COME WITH YOU AND STAY IN THE WAITING ROOM ONLY DURING PRE OP AND PROCEDURE DAY OF SURGERY. THE 2 VISITORS  MAY VISIT WITH YOU AFTER SURGERY IN YOUR PRIVATE ROOM DURING VISITING HOURS ONLY!  YOU NEED TO HAVE A COVID 19 TEST ON_6/3______ @_11 :00______, THIS TEST MUST BE DONE BEFORE SURGERY,  COVID TESTING SITE [facility] [place] [address], IT IS ON THE RIGHT GOING OUT [address] APPROXIMATELY  2 MINUTES PAST [employer] ON THE RIGHT. ONCE YOUR COVID TEST IS COMPLETED,  PLEASE BEGIN THE QUARANTINE INSTRUCTIONS AS OUTLINED IN YOUR HANDOUT.                Erin Wolf    Your procedure is scheduled on: 03/27/21   Report to [facility] Main  Entrance   Report to Short stay at 5:15 AM     Call this number if you have problems the morning of surgery [phone]   . BRUSH YOUR TEETH MORNING OF SURGERY AND RINSE YOUR MOUTH OUT, NO CHEWING GUM CANDY OR MINTS.   No food after midnight.    You may have clear liquid until 4:30 AM.    At 4:00 AM drink pre surgery drink  . Nothing by mouth after 4:30 AM.   Take these medicines the morning of surgery with A SIP OF WATER: Prozac, Nexium  How to Manage Your Diabetes Before and After Surgery  Why is it important to control my blood sugar before and after surgery? . Improving blood sugar levels before and after surgery helps healing and can limit problems. . A way of improving blood sugar control is eating a healthy diet by: o  Eating less sugar and carbohydrates o  Increasing activity/exercise o  Talking with your doctor about reaching your blood sugar goals . High blood sugars (greater than 180 mg/dL) can raise your risk of infections and slow your recovery, so you will need to focus on controlling your diabetes during the weeks before surgery. . Make sure that the doctor who takes care of your diabetes knows about your planned surgery including the date and  location.  How do I manage my blood sugar before surgery? . Check your blood sugar at least 4 times a day, starting 2 days before surgery, to make sure that the level is not too high or low. o Check your blood sugar the morning of your surgery when you wake up and every 2 hours until you get to the Short Stay unit. . If your blood sugar is less than 70 mg/dL, you will need to treat for low blood sugar: o Do not take insulin. o Treat a low blood sugar (less than 70 mg/dL) with  cup of clear juice (cranberry or apple), 4 glucose tablets, OR glucose gel. o Recheck blood sugar in 15 minutes after treatment (to make sure it is greater than 70 mg/dL). If your blood sugar is not greater than 70 mg/dL on recheck, call [phone] for further instructions. . Report your blood sugar to the short stay nurse when you get to Short Stay.  . If you are admitted to the hospital after surgery: o Your blood sugar will be checked by the staff and you will probably be given insulin after surgery (instead of oral diabetes medicines) to make sure you have good blood sugar levels. o The goal for blood sugar control after surgery is 80-180 mg/dL.   WHAT DO  I DO ABOUT MY DIABETES MEDICATION?  Marland Kitchen Do not take oral diabetes medicines (pills) the morning of surgery.                                 You may not have any metal on your body including hair pins and              piercings  Do not wear jewelry, make-up, lotions, powders or perfumes, deodorant             Do not wear nail polish on your fingernails.  Do not shave  48 hours prior to surgery.               Do not bring valuables to the hospital. [place].  Contacts, dentures or bridgework may not be worn into surgery.      P             Please read over the following fact sheets you were given: _____________________________________________________________________             [facility] - Preparing  for Surgery Before surgery, you can play an important role.  Because skin is not sterile, your skin needs to be as free of germs as possible.  You can reduce the number of germs on your skin by washing with CHG (chlorahexidine gluconate) soap before surgery.  CHG is an antiseptic cleaner which kills germs and bonds with the skin to continue killing germs even after washing. Please DO NOT use if you have an allergy to CHG or antibacterial soaps.  If your skin becomes reddened/irritated stop using the CHG and inform your nurse when you arrive at Short Stay. Do not shave (including legs and underarms) for at least 48 hours prior to the first CHG shower.    Please follow these instructions carefully:  1.  Shower with CHG Soap the night before surgery and the  morning of Surgery.  2.  If you choose to wash your hair, wash your hair first as usual with your  normal  shampoo.  3.  After you shampoo, rinse your hair and body thoroughly to remove the  shampoo.                                        4.  Use CHG as you would any other liquid soap.  You can apply chg directly  to the skin and wash                       Gently with a scrungie or clean washcloth.  5.  Apply the CHG Soap to your body ONLY FROM THE NECK DOWN.   Do not use on face/ open                           Wound or open sores. Avoid contact with eyes, ears mouth and genitals (private parts).                       Wash face,  Genitals (private parts) with your normal soap.  6.  Wash thoroughly, paying special attention to the area where your surgery  will be performed.  7.  Thoroughly rinse your body with warm water from the neck down.  8.  DO NOT shower/wash with your normal soap after using and rinsing off  the CHG Soap.             9.  Pat yourself dry with a clean towel.            10.  Wear clean pajamas.            11.  Place clean sheets on your bed the night of your first shower and do not  sleep with pets. Day of Surgery  : Do not apply any lotions/deodorants the morning of surgery.  Please wear clean clothes to the hospital/surgery center.  FAILURE TO FOLLOW THESE INSTRUCTIONS MAY RESULT IN THE CANCELLATION OF YOUR SURGERY PATIENT SIGNATURE_________________________________  NURSE SIGNATURE__________________________________  ________________________________________________________________________   Erin Wolf  An incentive spirometer is a tool that can help keep your lungs clear and active. This tool measures how well you are filling your lungs with each breath. Taking long deep breaths may help reverse or decrease the chance of developing breathing (pulmonary) problems (especially infection) following:  A long period of time when you are unable to move or be active. BEFORE THE PROCEDURE   If the spirometer includes an indicator to show your best effort, your nurse or respiratory therapist will set it to a desired goal.  If possible, sit up straight or lean slightly forward. Try not to slouch.  Hold the incentive spirometer in an upright position. INSTRUCTIONS FOR USE  1. Sit on the edge of your bed if possible, or sit up as far as you can in bed or on a chair. 2. Hold the incentive spirometer in an upright position. 3. Breathe out normally. 4. Place the mouthpiece in your mouth and seal your lips tightly around it. 5. Breathe in slowly and as deeply as possible, raising the piston or the ball toward the top of the column. 6. Hold your breath for 3-5 seconds or for as long as possible. Allow the piston or ball to fall to the bottom of the column. 7. Remove the mouthpiece from your mouth and breathe out normally. 8. Rest for a few seconds and repeat Steps 1 through 7 at least 10 times every 1-2 hours when you are awake. Take your time and take a few normal breaths between deep breaths. 9. The spirometer may include an indicator to show your best effort. Use the indicator as a goal to work  toward during each repetition. 10. After each set of 10 deep breaths, practice coughing to be sure your lungs are clear. If you have an incision (the cut made at the time of surgery), support your incision when coughing by placing a pillow or rolled up towels firmly against it. Once you are able to get out of bed, walk around indoors and cough well. You may stop using the incentive spirometer when instructed by your caregiver.  RISKS AND COMPLICATIONS  Take your time so you do not get dizzy or light-headed.  If you are in pain, you may need to take or ask for pain medication before doing incentive spirometry. It is harder to take a deep breath if you are having pain. AFTER USE  Rest and breathe slowly and easily.  It can be helpful to keep track of a log of your progress. Your  caregiver can provide you with a simple table to help with this. If you are using the spirometer at home, follow these instructions: [place] IF:   You are having difficultly using the spirometer.  You have trouble using the spirometer as often as instructed.  Your pain medication is not giving enough relief while using the spirometer.  You develop fever of 100.5 F (38.1 C) or higher. SEEK IMMEDIATE MEDICAL CARE IF:   You cough up bloody sputum that had not been present before.  You develop fever of 102 F (38.9 C) or greater.  You develop worsening pain at or near the incision site. MAKE SURE YOU:   Understand these instructions.  Will watch your condition.  Will get help right away if you are not doing well or get worse. Document Released: 02/17/2007 Document Revised: 12/30/2011 Document Reviewed: 04/20/2007 [facility] Patient Information 2014 [place], [place].   ________________________________________________________________________

## 2021-03-21 ENCOUNTER — Other Ambulatory Visit: Payer: Self-pay

## 2021-03-21 ENCOUNTER — Encounter (HOSPITAL_COMMUNITY): Payer: Self-pay

## 2021-03-21 ENCOUNTER — Encounter (HOSPITAL_COMMUNITY)
Admission: RE | Admit: 2021-03-21 | Discharge: 2021-03-21 | Disposition: A | Discharge status: Home / Self Care | Payer: Medicare HMO | Source: Ambulatory Visit | Attending: Orthopedic Surgery | Admitting: Orthopedic Surgery

## 2021-03-21 DIAGNOSIS — Z01812 Encounter for preprocedural laboratory examination: Secondary | ICD-10-CM | POA: Diagnosis not present

## 2021-03-21 HISTORY — DX: Inflammatory liver disease, unspecified: K75.9

## 2021-03-21 LAB — COMPREHENSIVE METABOLIC PANEL
ALT: 14 U/L (ref 0–44)
AST: 16 U/L (ref 15–41)
Albumin: 4.1 g/dL (ref 3.5–5.0)
Alkaline Phosphatase: 66 U/L (ref 38–126)
Anion gap: 10 (ref 5–15)
BUN: 19 mg/dL (ref 8–23)
CO2: 25 mmol/L (ref 22–32)
Calcium: 9.5 mg/dL (ref 8.9–10.3)
Chloride: 101 mmol/L (ref 98–111)
Creatinine, Ser: 0.65 mg/dL (ref 0.44–1.00)
GFR, Estimated: >60 mL/min (ref >60)
Glucose, Bld: 95 mg/dL (ref 70–99)
Potassium: 3.2 mmol/L — ABNORMAL LOW (ref 3.5–5.1)
Sodium: 136 mmol/L (ref 135–145)
Total Bilirubin: 0.4 mg/dL (ref 0.3–1.2)
Total Protein: 8.7 g/dL — ABNORMAL HIGH (ref 6.5–8.1)

## 2021-03-21 LAB — CBC
HCT: 42.1 % (ref 36.0–46.0)
Hemoglobin: 13.2 g/dL (ref 12.0–15.0)
MCH: 25.2 pg — ABNORMAL LOW (ref 26.0–34.0)
MCHC: 31.4 g/dL (ref 30.0–36.0)
MCV: 80.3 fL (ref 80.0–100.0)
Platelets: 376 10*3/uL (ref 150–400)
RBC: 5.24 MIL/uL — ABNORMAL HIGH (ref 3.87–5.11)
RDW: 15.9 % — ABNORMAL HIGH (ref 11.5–15.5)
WBC: 6 10*3/uL (ref 4.0–10.5)
nRBC: 0 % (ref 0.0–0.2)

## 2021-03-21 LAB — PROTIME-INR
INR: 1 (ref 0.8–1.2)
Prothrombin Time: 12.8 seconds (ref 11.4–15.2)

## 2021-03-21 LAB — SURGICAL PCR SCREEN
MRSA, PCR: NEGATIVE
Staphylococcus aureus: POSITIVE — AB

## 2021-03-21 LAB — APTT: aPTT: 38 seconds — ABNORMAL HIGH (ref 24–36)

## 2021-03-21 LAB — GLUCOSE, CAPILLARY: Glucose-Capillary: 113 mg/dL — ABNORMAL HIGH (ref 70–99)

## 2021-03-21 NOTE — Progress Notes (Signed)
COVID Vaccine Completed:yes Date COVID Vaccine completed:01/26/20-booster 09/29/20 COVID vaccine manufacturer:   [employer]   PCP - Dr. Rogers Blocker Cardiologist - none  Chest x-ray - no EKG - 2.25.22-epic Stress Test - no ECHO - 06/30/16-epic Cardiac Cath - no Pacemaker/ICD device last checked:NA  Sleep Study - no CPAP -   Fasting Blood Sugar - 110-168 Checks Blood Sugar QD_____ times a day  Blood Thinner Instructions:Xarelto/ Dr. Kenton Kingfisher Aspirin Instructions:Stop 5 days prior to DOS/Dr. Alvan Dame Last Dose:03/22/21  Anesthesia review:   Patient denies shortness of breath, fever, cough and chest pain at PAT appointment Yes. Pt doesn't climb stairs but no SOB doing house work or with ADLs. Pain can trigger a panic attack. She doesn't want a spinal.  Patient verbalized understanding of instructions that were given to them at the PAT appointment. Patient was also instructed that they will need to review over the PAT instructions again at home before surgery.yes

## 2021-03-23 ENCOUNTER — Other Ambulatory Visit (HOSPITAL_COMMUNITY)
Admission: RE | Admit: 2021-03-23 | Discharge: 2021-03-23 | Disposition: A | Discharge status: Home / Self Care | Payer: Medicare HMO | Source: Ambulatory Visit | Attending: Orthopedic Surgery | Admitting: Orthopedic Surgery

## 2021-03-23 DIAGNOSIS — G8929 Other chronic pain: Secondary | ICD-10-CM | POA: Diagnosis not present

## 2021-03-23 DIAGNOSIS — G43001 Migraine without aura, not intractable, with status migrainosus: Secondary | ICD-10-CM | POA: Diagnosis not present

## 2021-03-23 DIAGNOSIS — M150 Primary generalized (osteo)arthritis: Secondary | ICD-10-CM | POA: Diagnosis not present

## 2021-03-23 DIAGNOSIS — F324 Major depressive disorder, single episode, in partial remission: Secondary | ICD-10-CM | POA: Diagnosis not present

## 2021-03-23 DIAGNOSIS — E1169 Type 2 diabetes mellitus with other specified complication: Secondary | ICD-10-CM | POA: Diagnosis not present

## 2021-03-23 DIAGNOSIS — Z20822 Contact with and (suspected) exposure to covid-19: Secondary | ICD-10-CM | POA: Insufficient documentation

## 2021-03-23 DIAGNOSIS — I10 Essential (primary) hypertension: Secondary | ICD-10-CM | POA: Diagnosis not present

## 2021-03-23 DIAGNOSIS — Z01812 Encounter for preprocedural laboratory examination: Secondary | ICD-10-CM | POA: Insufficient documentation

## 2021-03-23 DIAGNOSIS — E7800 Pure hypercholesterolemia, unspecified: Secondary | ICD-10-CM | POA: Diagnosis not present

## 2021-03-23 DIAGNOSIS — H269 Unspecified cataract: Secondary | ICD-10-CM | POA: Diagnosis not present

## 2021-03-23 LAB — SARS CORONAVIRUS 2 (TAT 6-24 HRS): SARS Coronavirus 2: NEGATIVE

## 2021-03-26 NOTE — Anesthesia Preprocedure Evaluation (Addendum)
Anesthesia Evaluation  Patient identified by MRN, date of birth, ID band Patient awake    Reviewed: Allergy & Precautions, NPO status , Patient's Chart, lab work & pertinent test results  Airway Mallampati: II       Dental no notable dental hx.    Pulmonary neg pulmonary ROS,    Pulmonary exam normal        Cardiovascular hypertension, Pt. on medications Normal cardiovascular exam     Neuro/Psych  Headaches, Seizures -, Well Controlled,  PSYCHIATRIC DISORDERS Anxiety Depression    GI/Hepatic PUD, GERD  Medicated,(+) Hepatitis -, C  Endo/Other  diabetes  Renal/GU negative Renal ROS     Musculoskeletal  (+) Arthritis , Osteoarthritis,    Abdominal (+) + obese,   Peds  Hematology   Anesthesia Other Findings   Reproductive/Obstetrics                           Anesthesia Physical Anesthesia Plan  ASA: III  Anesthesia Plan: General   Post-op Pain Management:  Regional for Post-op pain   Induction:   PONV Risk Score and Plan: 2 and Ondansetron, Treatment may vary due to age or medical condition and Midazolam  Airway Management Planned: LMA  Additional Equipment: None  Intra-op Plan:   Post-operative Plan: Extubation in OR  Informed Consent: I have reviewed the patients History and Physical, chart, labs and discussed the procedure including the risks, benefits and alternatives for the proposed anesthesia with the patient or authorized representative who has indicated his/her understanding and acceptance.     Dental advisory given  Plan Discussed with: CRNA  Anesthesia Plan Comments:        Anesthesia Quick Evaluation

## 2021-03-26 NOTE — H&P (Signed)
TOTAL KNEE ADMISSION H&P  Patient is being admitted for right total knee arthroplasty.  Subjective:  Chief Complaint:right knee pain.  HPI: Erin Wolf, 65 y.o. female, has a history of pain and functional disability in the right knee due to arthritis and has failed non-surgical conservative treatments for greater than 12 weeks to includeNSAID's and/or analgesics and activity modification.  Onset of symptoms was gradual, starting 2 years ago with gradually worsening course since that time. The patient noted no past surgery on the right knee(s).  Patient currently rates pain in the right knee(s) at 10 out of 10 with activity. Patient has worsening of pain with activity and weight bearing, pain that interferes with activities of daily living and pain with passive range of motion.  Patient has evidence of joint space narrowing by imaging studies. . There is no active infection.  Patient Active Problem List   Diagnosis Date Noted  . Osteoarthritis of left knee 12/19/2020  . Status post total left knee replacement 12/19/2020  . Chronic abdominal pain 05/05/2020  . Chronic pain of left knee   . Chronic migraine 11/10/2018  . Nausea & vomiting 10/18/2016  . History of ulcerative colitis   . LLQ abdominal pain 10/17/2016  . Syncope and collapse 06/29/2016  . Left-sided weakness 06/29/2016  . Facial twitching 06/29/2016  . IBS (irritable bowel syndrome) 06/29/2016  . Faintness   . Dehydration 05/09/2014  . GERD (gastroesophageal reflux disease) 05/09/2014  . Gastroenteritis 05/09/2014  . Chest pain, atypical 05/09/2014  . Chronic ulcerative colitis ([place]) 05/09/2014  . Acute gastroenteritis 05/09/2014  . Ulcerative colitis ([place]) 05/27/2013  . Hypokalemia 05/27/2013  . Anemia 01/04/2012  . Transaminitis 01/04/2012  . Diarrhea 01/04/2012  . Chest pain 01/03/2012  . SOB (shortness of breath) 01/03/2012  . HTN (hypertension), benign 01/03/2012  . Hx of migraines 01/03/2012  . Seizure  ([place]) 01/03/2012  . Depression 01/03/2012  . Ulcerative colitis 01/03/2012  . Chronic back pain 01/03/2012  . 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 Hyperlipidemia 01/03/2012  . Hepatitis C 01/03/2012  . Fibroids 01/03/2012   Past Medical History:  Diagnosis Date  . Anxiety   . Arthritis   . Chronic abdominal pain   . Chronic lower back pain   . Chronic nausea   . Depression   . Diabetes mellitus without complication ([place])   . Diverticulosis   . Fibromyalgia   . Frequency of urination   . GERD (gastroesophageal reflux disease)   . Headache   . Hematuria   . Hepatitis   . Hepatitis C antibody positive in blood    per pt told by pcp  . History of panic attacks   . History of syncope    hx recurrent syncope --- per epic domentation non-cardiac , orthostatic hypotension, anxiety, dehydration  . History of uterine fibroid   . HTN (hypertension), benign   . Hyperlipidemia 01/03/2012  . IBS (irritable bowel syndrome)   . Myalgia   . Seizure disorder ([place]) followed by pcp until new neurologist appr. in jan 2019   "started having them in my 20's" ,  petit mal ----  per pt last seizure one 2015  . SLE (systemic lupus erythematosus) [facility])    rheumatologist-- dr Gerilyn Nestle (consult 11-12-2016) having work-up done  . Ulcerative colitis    followed by dr Penelope Coop at [facility]  . Wears glasses     Past Surgical History:  Procedure Laterality Date  . ABDOMINAL HYSTERECTOMY  1988  . CHOLECYSTECTOMY OPEN  1978  .  COLONOSCOPY N/A 05/31/2013   Procedure: COLONOSCOPY;  Surgeon: Wonda Horner, MD;  Location: [facility] ENDOSCOPY;  Service: Endoscopy;  Laterality: N/A;  . CYSTOSCOPY/RETROGRADE/URETEROSCOPY Bilateral 07/23/2017   Procedure: CYSTOSCOPY/RETROGRADE/URETEROSCOPY;  Surgeon: Ceasar Mons, MD;  [facility];  Service: Urology;  Laterality: Bilateral;  . ESOPHAGOGASTRODUODENOSCOPY (EGD) WITH PROPOFOL N/A 05/11/2014   Procedure: ESOPHAGOGASTRODUODENOSCOPY (EGD) WITH  PROPOFOL;  Surgeon: Lear Ng, MD;  Location: [facility] ENDOSCOPY;  Service: Endoscopy;  Laterality: N/A;  egd first  . ESOPHAGOGASTRODUODENOSCOPY (EGD) WITH PROPOFOL N/A 05/08/2020   Procedure: ESOPHAGOGASTRODUODENOSCOPY (EGD) WITH PROPOFOL;  Surgeon: Arta Silence, MD;  Location: [facility] ENDOSCOPY;  Service: Endoscopy;  Laterality: N/A;  . EUS N/A 06/21/2016   Procedure: ESOPHAGEAL ENDOSCOPIC ULTRASOUND (EUS) RADIAL;  Surgeon: Arta Silence, MD;  Location: [facility] ENDOSCOPY;  Service: Endoscopy;  Laterality: N/A;  . FLEXIBLE SIGMOIDOSCOPY N/A 05/11/2014   Procedure: FLEXIBLE SIGMOIDOSCOPY;  Surgeon: Lear Ng, MD;  Location: [facility] ENDOSCOPY;  Service: Endoscopy;  Laterality: N/A;  . LAPAROTOMY W/ BILATERAL SALPINGOOPHORECTOMY  09-26-2003   dr Matthew Saras at [facility]  . LAPROSCOPY W/ LYSIS ADHESIONS  06-08-2003   dr Radene Knee [facility] [place]  . TOTAL KNEE ARTHROPLASTY Left 12/19/2020   Procedure: TOTAL KNEE ARTHROPLASTY;  Surgeon: Paralee Cancel, MD;  Location: [facility];  Service: Orthopedics;  Laterality: Left;  70 mins  . TRANSTHORACIC ECHOCARDIOGRAM  04/29/2016   ef 60-65%,  grade 2 diastolic dysfunction/  trivial MR and TR  . TUBAL LIGATION Bilateral yrs ago  . UPPER ESOPHAGEAL ENDOSCOPIC ULTRASOUND (EUS) N/A 05/08/2020   Procedure: UPPER ESOPHAGEAL ENDOSCOPIC ULTRASOUND (EUS);  Surgeon: Arta Silence, MD;  Location: [facility];  Service: Endoscopy;  Laterality: N/A;    No current facility-administered medications for this encounter.   Current Outpatient Medications  Medication Sig Dispense Refill Last Dose  . Chlorphen-Pseudoephed-APAP (THERAFLU FLU/COLD PO) Take 1 packet by mouth daily as needed (cold symptoms).     . cyclobenzaprine (FLEXERIL) 10 MG tablet Take 10 mg by mouth as needed for muscle spasms.     Marland Kitchen esomeprazole (NEXIUM) 40 MG capsule Take 40 mg by mouth daily as needed (acid reflux).     Marland Kitchen FLUoxetine (PROZAC) 20 MG capsule Take 20 mg by mouth daily.      . furosemide (LASIX) 20 MG tablet Take 20 mg  by mouth daily as needed for edema.     . Multiple Vitamins-Minerals (WOMENS MULTI PO) Take 1 tablet by mouth daily.     . naloxone (NARCAN) nasal spray 4 mg/0.1 mL Place 1 spray into the nose daily as needed (opioid overdose).     Marland Kitchen oxyCODONE 10 MG TABS Take 1-2 tablets (10-20 mg total) by mouth every 4 (four) hours as needed for moderate pain or severe pain. (Patient taking differently: Take 20 mg by mouth 3 (three) times daily as needed for moderate pain or severe pain.) 60 tablet 0   . Polyethyl Glycol-Propyl Glycol (SYSTANE OP) Place 1 drop into both eyes 2 (two) times daily as needed (dry eyes).     . rosuvastatin (CRESTOR) 10 MG tablet Take 10 mg by mouth every Monday.     . triamterene-hydrochlorothiazide (MAXZIDE) 75-50 MG tablet Take 1 tablet by mouth every morning.     . docusate sodium (COLACE) 100 MG capsule Take 1 capsule (100 mg total) by mouth 2 (two) times daily. (Patient not taking: No sig reported) 28 capsule 0 Not Taking at Unknown time  . ferrous sulfate (FERROUSUL) 325 (65 FE) MG tablet Take 1 tablet (  325 mg total) by mouth 3 (three) times daily with meals for 14 days. 42 tablet 0   . polyethylene glycol (MIRALAX / GLYCOLAX) 17 g packet Take 17 g by mouth 2 (two) times daily. (Patient not taking: No sig reported) 28 packet 0 Not Taking at Unknown time  . rivaroxaban (XARELTO) 10 MG TABS tablet Take 1 tablet (10 mg total) by mouth daily. (Patient not taking: No sig reported) 14 tablet 0 Not Taking at Unknown time   Allergies  Allergen Reactions  . Latex Other (See Comments)    Peels skin  . Nsaids Other (See Comments)    HAS COLITIS FLARES  . Wellbutrin [Bupropion Hcl] Other (See Comments)    Insomnia and headaches  . Aspirin Nausea Only  . Darvocet [Propoxyphene N-Acetaminophen] Nausea And Vomiting  . Tape Hives  . Toradol [Ketorolac Tromethamine] Nausea And Vomiting  . Tylenol [Acetaminophen] Nausea Only  . Butalbital-Apap-Caffeine     stomach upset  . Ciprofloxacin      stomach upset  . Eszopiclone Swelling  . Milk-Related Compounds Diarrhea  . Other     Spicy foods cause diarrhea due to colitis  . Topiramate     stomach upset    Social History   Tobacco Use  . Smoking status: Never Smoker  . Smokeless tobacco: Never Used  Substance Use Topics  . Alcohol use: Yes    Comment: occ    Family History  Problem Relation Age of Onset  . Alzheimer's disease Mother   . Hypertension Mother   . Colon cancer Brother   . Hypertension Brother   . Other Father        ruptured appendix  . Breast cancer Sister   . Hypertension Sister   . Hypertension Sister   . Memory loss Brother   . Hypertension Sister   . Heart disease Sister      Review of Systems  Constitutional: Negative for chills and fever.  Respiratory: Negative for cough and shortness of breath.   Cardiovascular: Negative for chest pain.  Gastrointestinal: Negative for nausea and vomiting.  Musculoskeletal: Positive for arthralgias.    Objective:  Physical Exam Well nourished and well developed. General: Alert and oriented x3, cooperative and pleasant, no acute distress. Head: normocephalic, atraumatic, neck supple. Eyes: EOMI.  Musculoskeletal: Right Knee: Moderate tenderness to palpation about the medial and lateral joint line of the right knee. AROM 0-115 degrees. No effusion noted. No instability. mild patellofemoral crepitus.  Calves soft and nontender. Motor function intact in LE. Strength 5/5 LE bilaterally. Neuro: Distal pulses 2+. Sensation to light touch intact in LE.  Vital signs in last 24 hours:    Labs:   Estimated body mass index is 35.94 kg/m as calculated from the following:   Height as of 03/21/21: 5\' 5"  (1.651 m).   Weight as of 03/21/21: 98 kg.   Imaging Review Plain radiographs demonstrate severe degenerative joint disease of the right knee(s). The overall alignment isneutral. The bone quality appears to be adequate for age and reported activity  level.      Assessment/Plan:  End stage arthritis, right knee   The patient history, physical examination, clinical judgment of the provider and imaging studies are consistent with end stage degenerative joint disease of the right knee(s) and total knee arthroplasty is deemed medically necessary. The treatment options including medical management, injection therapy arthroscopy and arthroplasty were discussed at length. The risks and benefits of total knee arthroplasty were presented and reviewed. The risks  due to aseptic loosening, infection, stiffness, patella tracking problems, thromboembolic complications and other imponderables were discussed. The patient acknowledged the explanation, agreed to proceed with the plan and consent was signed. Patient is being admitted for inpatient treatment for surgery, pain control, PT, OT, prophylactic antibiotics, VTE prophylaxis, progressive ambulation and ADL's and discharge planning. The patient is planning to be discharged home.  Therapy Plans: outpatient therapy at [facility] Disposition: Home with husband Planned DVT Prophylaxis: Xarelto 10mg  daily DME needed: none PCP: Dr. Shirline Frees, clearance received TXA: IV Allergies: tylenol - nausea, ketorolac - nausea, latex - skin irritation, NSAIDs - nausea, PCN - childhood hives, buproprion - hives Anesthesia Concerns: none BMI: 34.6 Last HgbA1c: 6.5%  Other: - I had a long discussion with the patient today regarding pre/post op pain control. Patient states she discontinued Oxycodone on 5/8 because it was not working. She was given #130 tabs on 5/5 per PDMR. She wishes to try Norco instead, and was instructed to take her remaining Oxycodone to the pharmacy. Discussed with Dr. Alvan Dame.   Anticipated LOS equal to or greater than 2 midnights due to - Age 100 and older with one or more of the following:  - Obesity  - Expected need for hospital services (PT, OT, Nursing) required for safe   discharge  - Anticipated need for postoperative skilled nursing care or inpatient rehab  - Active co-morbidities: Chronic pain requiring opiods OR   - Unanticipated findings during/Post Surgery: None  - Patient is a high risk of re-admission due to: None  Griffith Citron, PA-C Orthopedic Surgery [facility] [place] [phone]

## 2021-03-27 ENCOUNTER — Ambulatory Visit (HOSPITAL_COMMUNITY): Payer: Medicare HMO | Admitting: Anesthesiology

## 2021-03-27 ENCOUNTER — Encounter (HOSPITAL_COMMUNITY)
Admission: RE | Disposition: A | Discharge status: Home / Self Care | Payer: Self-pay | Source: Home / Self Care | Attending: Orthopedic Surgery

## 2021-03-27 ENCOUNTER — Encounter (HOSPITAL_COMMUNITY): Payer: Self-pay | Admitting: Orthopedic Surgery

## 2021-03-27 ENCOUNTER — Observation Stay (HOSPITAL_COMMUNITY)
Admission: RE | Admit: 2021-03-27 | Discharge: 2021-03-28 | Disposition: A | Discharge status: Home / Self Care | Payer: Medicare HMO | Attending: Orthopedic Surgery | Admitting: Orthopedic Surgery

## 2021-03-27 ENCOUNTER — Ambulatory Visit (HOSPITAL_COMMUNITY): Payer: Medicare HMO | Admitting: Physician Assistant

## 2021-03-27 ENCOUNTER — Other Ambulatory Visit: Payer: Self-pay

## 2021-03-27 DIAGNOSIS — Z91040 Latex allergy status: Secondary | ICD-10-CM | POA: Diagnosis not present

## 2021-03-27 DIAGNOSIS — I10 Essential (primary) hypertension: Secondary | ICD-10-CM | POA: Insufficient documentation

## 2021-03-27 DIAGNOSIS — M1711 Unilateral primary osteoarthritis, right knee: Secondary | ICD-10-CM | POA: Diagnosis not present

## 2021-03-27 DIAGNOSIS — Z96652 Presence of left artificial knee joint: Secondary | ICD-10-CM | POA: Diagnosis not present

## 2021-03-27 DIAGNOSIS — Z79899 Other long term (current) drug therapy: Secondary | ICD-10-CM | POA: Diagnosis not present

## 2021-03-27 DIAGNOSIS — E785 Hyperlipidemia, unspecified: Secondary | ICD-10-CM | POA: Diagnosis not present

## 2021-03-27 DIAGNOSIS — Z7901 Long term (current) use of anticoagulants: Secondary | ICD-10-CM | POA: Insufficient documentation

## 2021-03-27 DIAGNOSIS — E119 Type 2 diabetes mellitus without complications: Secondary | ICD-10-CM | POA: Insufficient documentation

## 2021-03-27 DIAGNOSIS — Z96651 Presence of right artificial knee joint: Secondary | ICD-10-CM

## 2021-03-27 DIAGNOSIS — G8918 Other acute postprocedural pain: Secondary | ICD-10-CM | POA: Diagnosis not present

## 2021-03-27 HISTORY — PX: TOTAL KNEE ARTHROPLASTY: SHX125

## 2021-03-27 LAB — TYPE AND SCREEN
ABO/RH(D): O POS
Antibody Screen: NEGATIVE

## 2021-03-27 LAB — GLUCOSE, CAPILLARY: Glucose-Capillary: 172 mg/dL — ABNORMAL HIGH (ref 70–99)

## 2021-03-27 SURGERY — ARTHROPLASTY, KNEE, TOTAL
Anesthesia: Spinal | Site: Knee | Laterality: Right

## 2021-03-27 MED ORDER — POVIDONE-IODINE 10 % EX SWAB
2.0000 "application " | Freq: Once | CUTANEOUS | Status: AC
  Administered 2021-03-27: 2 via TOPICAL

## 2021-03-27 MED ORDER — PROPOFOL 10 MG/ML IV BOLUS
INTRAVENOUS | Status: AC
  Filled 2021-03-27: qty 20

## 2021-03-27 MED ORDER — LACTATED RINGERS IV SOLN: INTRAVENOUS | Status: DC

## 2021-03-27 MED ORDER — KETOROLAC TROMETHAMINE 15 MG/ML IJ SOLN: 15.0000 mg | Freq: Once | INTRAMUSCULAR | Status: DC

## 2021-03-27 MED ORDER — ROSUVASTATIN CALCIUM 10 MG PO TABS: 10.0000 mg | ORAL_TABLET | ORAL | Status: DC

## 2021-03-27 MED ORDER — BUPIVACAINE-EPINEPHRINE (PF) 0.25% -1:200000 IJ SOLN
INTRAMUSCULAR | Status: DC | PRN
  Administered 2021-03-27: 30 mL

## 2021-03-27 MED ORDER — DEXAMETHASONE SODIUM PHOSPHATE 10 MG/ML IJ SOLN
8.0000 mg | Freq: Once | INTRAMUSCULAR | Status: AC
  Administered 2021-03-27: 8 mg via INTRAVENOUS

## 2021-03-27 MED ORDER — CYCLOBENZAPRINE HCL 10 MG PO TABS: 10.0000 mg | ORAL_TABLET | ORAL | Status: DC | PRN

## 2021-03-27 MED ORDER — SODIUM CHLORIDE (PF) 0.9 % IJ SOLN
INTRAMUSCULAR | Status: AC
  Filled 2021-03-27: qty 30

## 2021-03-27 MED ORDER — PANTOPRAZOLE SODIUM 40 MG PO TBEC
40.0000 mg | DELAYED_RELEASE_TABLET | Freq: Every day | ORAL | Status: DC
  Administered 2021-03-28: 40 mg via ORAL
  Filled 2021-03-27: qty 1

## 2021-03-27 MED ORDER — PROMETHAZINE HCL 25 MG/ML IJ SOLN
INTRAMUSCULAR | Status: AC
  Filled 2021-03-27: qty 1

## 2021-03-27 MED ORDER — FLUOXETINE HCL 20 MG PO CAPS
20.0000 mg | ORAL_CAPSULE | Freq: Every day | ORAL | Status: DC
  Administered 2021-03-28: 20 mg via ORAL
  Filled 2021-03-27: qty 1

## 2021-03-27 MED ORDER — DIPHENHYDRAMINE HCL 12.5 MG/5ML PO ELIX: 12.5000 mg | ORAL_SOLUTION | ORAL | Status: DC | PRN

## 2021-03-27 MED ORDER — METOCLOPRAMIDE HCL 5 MG/ML IJ SOLN: 5.0000 mg | Freq: Three times a day (TID) | INTRAMUSCULAR | Status: DC | PRN

## 2021-03-27 MED ORDER — ACETAMINOPHEN 10 MG/ML IV SOLN
1000.0000 mg | Freq: Once | INTRAVENOUS | Status: AC
  Administered 2021-03-27: 1000 mg via INTRAVENOUS

## 2021-03-27 MED ORDER — OXYCODONE HCL 5 MG PO TABS
15.0000 mg | ORAL_TABLET | ORAL | Status: DC | PRN
  Administered 2021-03-27: 15 mg via ORAL
  Administered 2021-03-27 – 2021-03-28 (×4): 20 mg via ORAL
  Filled 2021-03-27 (×3): qty 4
  Filled 2021-03-27: qty 3
  Filled 2021-03-27: qty 4
  Filled 2021-03-27: qty 3
  Filled 2021-03-27: qty 4

## 2021-03-27 MED ORDER — PROMETHAZINE HCL 25 MG/ML IJ SOLN
6.2500 mg | INTRAMUSCULAR | Status: DC | PRN
  Administered 2021-03-27: 6.25 mg via INTRAVENOUS

## 2021-03-27 MED ORDER — SODIUM CHLORIDE 0.9 % IV SOLN: INTRAVENOUS | Status: DC

## 2021-03-27 MED ORDER — ONDANSETRON HCL 4 MG/2ML IJ SOLN
INTRAMUSCULAR | Status: DC | PRN
  Administered 2021-03-27: 4 mg via INTRAVENOUS

## 2021-03-27 MED ORDER — METHOCARBAMOL 500 MG PO TABS
500.0000 mg | ORAL_TABLET | Freq: Four times a day (QID) | ORAL | Status: DC | PRN
  Administered 2021-03-27: 500 mg via ORAL
  Filled 2021-03-27 (×2): qty 1

## 2021-03-27 MED ORDER — MIDAZOLAM HCL 2 MG/2ML IJ SOLN
INTRAMUSCULAR | Status: AC
  Filled 2021-03-27: qty 2

## 2021-03-27 MED ORDER — TRANEXAMIC ACID-NACL 1000-0.7 MG/100ML-% IV SOLN
1000.0000 mg | Freq: Once | INTRAVENOUS | Status: AC
  Administered 2021-03-27: 1000 mg via INTRAVENOUS
  Filled 2021-03-27: qty 100

## 2021-03-27 MED ORDER — FENTANYL CITRATE (PF) 100 MCG/2ML IJ SOLN
25.0000 ug | INTRAMUSCULAR | Status: DC | PRN
  Administered 2021-03-27: 50 ug via INTRAVENOUS

## 2021-03-27 MED ORDER — RIVAROXABAN 10 MG PO TABS
10.0000 mg | ORAL_TABLET | Freq: Every day | ORAL | Status: DC
  Administered 2021-03-28: 10 mg via ORAL
  Filled 2021-03-27: qty 1

## 2021-03-27 MED ORDER — PHENYLEPHRINE 40 MCG/ML (10ML) SYRINGE FOR IV PUSH (FOR BLOOD PRESSURE SUPPORT)
PREFILLED_SYRINGE | INTRAVENOUS | Status: DC | PRN
  Administered 2021-03-27 (×2): 80 ug via INTRAVENOUS

## 2021-03-27 MED ORDER — FUROSEMIDE 20 MG PO TABS: 20.0000 mg | ORAL_TABLET | Freq: Every day | ORAL | Status: DC | PRN

## 2021-03-27 MED ORDER — HYDROMORPHONE HCL 2 MG/ML IJ SOLN
INTRAMUSCULAR | Status: AC
  Filled 2021-03-27: qty 1

## 2021-03-27 MED ORDER — HYDROMORPHONE HCL 1 MG/ML IJ SOLN
0.2500 mg | INTRAMUSCULAR | Status: DC | PRN
  Administered 2021-03-27 (×4): 0.5 mg via INTRAVENOUS

## 2021-03-27 MED ORDER — DOCUSATE SODIUM 100 MG PO CAPS
100.0000 mg | ORAL_CAPSULE | Freq: Two times a day (BID) | ORAL | Status: DC
  Administered 2021-03-27 – 2021-03-28 (×2): 100 mg via ORAL
  Filled 2021-03-27 (×2): qty 1

## 2021-03-27 MED ORDER — OXYCODONE HCL 5 MG PO TABS: 5.0000 mg | ORAL_TABLET | ORAL | Status: DC | PRN

## 2021-03-27 MED ORDER — TRANEXAMIC ACID-NACL 1000-0.7 MG/100ML-% IV SOLN
1000.0000 mg | INTRAVENOUS | Status: AC
  Administered 2021-03-27: 1000 mg via INTRAVENOUS
  Filled 2021-03-27: qty 100

## 2021-03-27 MED ORDER — KETOROLAC TROMETHAMINE 30 MG/ML IJ SOLN
INTRAMUSCULAR | Status: DC | PRN
  Administered 2021-03-27: 30 mg

## 2021-03-27 MED ORDER — ACETAMINOPHEN 10 MG/ML IV SOLN
INTRAVENOUS | Status: AC
  Filled 2021-03-27: qty 100

## 2021-03-27 MED ORDER — PHENOL 1.4 % MT LIQD: 1.0000 | OROMUCOSAL | Status: DC | PRN

## 2021-03-27 MED ORDER — KETOROLAC TROMETHAMINE 15 MG/ML IJ SOLN
15.0000 mg | Freq: Four times a day (QID) | INTRAMUSCULAR | Status: AC
  Administered 2021-03-28 (×2): 15 mg via INTRAVENOUS
  Filled 2021-03-27 (×3): qty 1

## 2021-03-27 MED ORDER — KETOROLAC TROMETHAMINE 30 MG/ML IJ SOLN
INTRAMUSCULAR | Status: AC
  Filled 2021-03-27: qty 1

## 2021-03-27 MED ORDER — HYDROMORPHONE HCL 1 MG/ML IJ SOLN
INTRAMUSCULAR | Status: DC | PRN
  Administered 2021-03-27 (×4): .5 mg via INTRAVENOUS

## 2021-03-27 MED ORDER — HYDROMORPHONE HCL 1 MG/ML IJ SOLN
INTRAMUSCULAR | Status: AC
  Filled 2021-03-27: qty 2

## 2021-03-27 MED ORDER — FENTANYL CITRATE (PF) 100 MCG/2ML IJ SOLN
INTRAMUSCULAR | Status: DC | PRN
  Administered 2021-03-27 (×2): 50 ug via INTRAVENOUS
  Administered 2021-03-27: 100 ug via INTRAVENOUS

## 2021-03-27 MED ORDER — CEFAZOLIN SODIUM-DEXTROSE 2-4 GM/100ML-% IV SOLN
2.0000 g | INTRAVENOUS | Status: AC
  Administered 2021-03-27: 2 g via INTRAVENOUS
  Filled 2021-03-27: qty 100

## 2021-03-27 MED ORDER — CHLORHEXIDINE GLUCONATE 0.12 % MT SOLN
15.0000 mL | Freq: Once | OROMUCOSAL | Status: AC
  Administered 2021-03-27: 15 mL via OROMUCOSAL

## 2021-03-27 MED ORDER — SODIUM CHLORIDE (PF) 0.9 % IJ SOLN
INTRAMUSCULAR | Status: DC | PRN
  Administered 2021-03-27: 30 mL

## 2021-03-27 MED ORDER — METHOCARBAMOL 500 MG IVPB - SIMPLE MED
INTRAVENOUS | Status: AC
  Filled 2021-03-27: qty 50

## 2021-03-27 MED ORDER — DEXAMETHASONE SODIUM PHOSPHATE 10 MG/ML IJ SOLN
INTRAMUSCULAR | Status: AC
  Filled 2021-03-27: qty 1

## 2021-03-27 MED ORDER — ONDANSETRON HCL 4 MG PO TABS: 4.0000 mg | ORAL_TABLET | Freq: Four times a day (QID) | ORAL | Status: DC | PRN

## 2021-03-27 MED ORDER — FERROUS SULFATE 325 (65 FE) MG PO TABS
325.0000 mg | ORAL_TABLET | Freq: Three times a day (TID) | ORAL | Status: DC
  Administered 2021-03-28: 325 mg via ORAL
  Filled 2021-03-27: qty 1

## 2021-03-27 MED ORDER — PHENYLEPHRINE HCL (PRESSORS) 10 MG/ML IV SOLN
INTRAVENOUS | Status: AC
  Filled 2021-03-27: qty 2

## 2021-03-27 MED ORDER — LIDOCAINE 2% (20 MG/ML) 5 ML SYRINGE
INTRAMUSCULAR | Status: DC | PRN
  Administered 2021-03-27: 80 mg via INTRAVENOUS

## 2021-03-27 MED ORDER — METHOCARBAMOL 500 MG IVPB - SIMPLE MED
500.0000 mg | Freq: Four times a day (QID) | INTRAVENOUS | Status: DC | PRN
  Administered 2021-03-27: 500 mg via INTRAVENOUS
  Filled 2021-03-27: qty 50

## 2021-03-27 MED ORDER — FENTANYL CITRATE (PF) 100 MCG/2ML IJ SOLN
INTRAMUSCULAR | Status: AC
  Filled 2021-03-27: qty 2

## 2021-03-27 MED ORDER — HYDROMORPHONE HCL 1 MG/ML IJ SOLN
0.5000 mg | INTRAMUSCULAR | Status: DC | PRN
  Administered 2021-03-27 – 2021-03-28 (×3): 1 mg via INTRAVENOUS
  Filled 2021-03-27 (×3): qty 1

## 2021-03-27 MED ORDER — ONDANSETRON HCL 4 MG/2ML IJ SOLN: 4.0000 mg | Freq: Four times a day (QID) | INTRAMUSCULAR | Status: DC | PRN

## 2021-03-27 MED ORDER — CLONIDINE HCL (ANALGESIA) 100 MCG/ML EP SOLN
EPIDURAL | Status: DC | PRN
  Administered 2021-03-27: 100 ug

## 2021-03-27 MED ORDER — 0.9 % SODIUM CHLORIDE (POUR BTL) OPTIME
TOPICAL | Status: DC | PRN
  Administered 2021-03-27: 1000 mL

## 2021-03-27 MED ORDER — SODIUM CHLORIDE 0.9 % IR SOLN
Status: DC | PRN
  Administered 2021-03-27: 1000 mL

## 2021-03-27 MED ORDER — PHENYLEPHRINE 40 MCG/ML (10ML) SYRINGE FOR IV PUSH (FOR BLOOD PRESSURE SUPPORT)
PREFILLED_SYRINGE | INTRAVENOUS | Status: AC
  Filled 2021-03-27: qty 10

## 2021-03-27 MED ORDER — TRIAMTERENE-HCTZ 75-50 MG PO TABS
1.0000 | ORAL_TABLET | Freq: Every morning | ORAL | Status: DC
  Administered 2021-03-28: 1 via ORAL
  Filled 2021-03-27: qty 1

## 2021-03-27 MED ORDER — DEXAMETHASONE SODIUM PHOSPHATE 10 MG/ML IJ SOLN
INTRAMUSCULAR | Status: DC | PRN
  Administered 2021-03-27: 10 mg

## 2021-03-27 MED ORDER — ORAL CARE MOUTH RINSE: 15.0000 mL | Freq: Once | OROMUCOSAL | Status: AC

## 2021-03-27 MED ORDER — METOCLOPRAMIDE HCL 5 MG PO TABS: 5.0000 mg | ORAL_TABLET | Freq: Three times a day (TID) | ORAL | Status: DC | PRN

## 2021-03-27 MED ORDER — ONDANSETRON HCL 4 MG/2ML IJ SOLN
INTRAMUSCULAR | Status: AC
  Filled 2021-03-27: qty 2

## 2021-03-27 MED ORDER — BISACODYL 10 MG RE SUPP: 10.0000 mg | Freq: Every day | RECTAL | Status: DC | PRN

## 2021-03-27 MED ORDER — CEFAZOLIN SODIUM-DEXTROSE 2-4 GM/100ML-% IV SOLN
2.0000 g | Freq: Four times a day (QID) | INTRAVENOUS | Status: AC
  Administered 2021-03-27 (×2): 2 g via INTRAVENOUS
  Filled 2021-03-27 (×2): qty 100

## 2021-03-27 MED ORDER — ROPIVACAINE HCL 7.5 MG/ML IJ SOLN
INTRAMUSCULAR | Status: DC | PRN
  Administered 2021-03-27 (×4): 5 mL via PERINEURAL

## 2021-03-27 MED ORDER — STERILE WATER FOR IRRIGATION IR SOLN
Status: DC | PRN
  Administered 2021-03-27: 2000 mL

## 2021-03-27 MED ORDER — DEXAMETHASONE SODIUM PHOSPHATE 10 MG/ML IJ SOLN
10.0000 mg | Freq: Once | INTRAMUSCULAR | Status: AC
  Administered 2021-03-28: 10 mg via INTRAVENOUS
  Filled 2021-03-27: qty 1

## 2021-03-27 MED ORDER — PROPOFOL 10 MG/ML IV BOLUS
INTRAVENOUS | Status: DC | PRN
  Administered 2021-03-27: 200 mg via INTRAVENOUS

## 2021-03-27 MED ORDER — BUPIVACAINE-EPINEPHRINE (PF) 0.25% -1:200000 IJ SOLN
INTRAMUSCULAR | Status: AC
  Filled 2021-03-27: qty 30

## 2021-03-27 MED ORDER — MEPERIDINE HCL 50 MG/ML IJ SOLN: 6.2500 mg | INTRAMUSCULAR | Status: DC | PRN

## 2021-03-27 MED ORDER — ROPIVACAINE HCL 5 MG/ML IJ SOLN
INTRAMUSCULAR | Status: DC | PRN
  Administered 2021-03-27 (×2): 5 mL via PERINEURAL

## 2021-03-27 MED ORDER — MIDAZOLAM HCL 5 MG/5ML IJ SOLN
INTRAMUSCULAR | Status: DC | PRN
  Administered 2021-03-27: 2 mg via INTRAVENOUS

## 2021-03-27 MED ORDER — FENTANYL CITRATE (PF) 100 MCG/2ML IJ SOLN
INTRAMUSCULAR | Status: AC
  Filled 2021-03-27: qty 4

## 2021-03-27 MED ORDER — MENTHOL 3 MG MT LOZG
1.0000 | LOZENGE | OROMUCOSAL | Status: DC | PRN
  Administered 2021-03-27: 3 mg via ORAL
  Filled 2021-03-27: qty 9

## 2021-03-27 MED ORDER — POLYETHYLENE GLYCOL 3350 17 G PO PACK: 17.0000 g | PACK | Freq: Every day | ORAL | Status: DC | PRN

## 2021-03-27 SURGICAL SUPPLY — 57 items
ADH SKN CLS APL DERMABOND .7 (GAUZE/BANDAGES/DRESSINGS) ×1
ATTUNE MED ANAT PAT 35 KNEE (Knees) ×1 IMPLANT
ATTUNE MED ANAT PAT 35MM KNEE (Knees) ×1 IMPLANT
ATTUNE PS FEM RT SZ 4 CEM KNEE (Femur) ×2 IMPLANT
ATTUNE PSRP INSR SZ4 8 KNEE (Insert) ×1 IMPLANT
ATTUNE PSRP INSR SZ4 8MM KNEE (Insert) ×1 IMPLANT
BAG SPEC THK2 15X12 ZIP CLS (MISCELLANEOUS)
BAG ZIPLOCK 12X15 (MISCELLANEOUS) IMPLANT
BASEPLATE TIBIAL ROTATING SZ 4 (Knees) ×2 IMPLANT
BLADE SAW SGTL 11.0X1.19X90.0M (BLADE) IMPLANT
BLADE SAW SGTL 13.0X1.19X90.0M (BLADE) ×3 IMPLANT
BLADE SURG SZ10 CARB STEEL (BLADE) ×6 IMPLANT
BNDG ELASTIC 6X5.8 VLCR STR LF (GAUZE/BANDAGES/DRESSINGS) ×3 IMPLANT
BOWL SMART MIX CTS (DISPOSABLE) ×3 IMPLANT
BSPLAT TIB 4 CMNT ROT PLAT STR (Knees) ×1 IMPLANT
CEMENT HV SMART SET (Cement) ×4 IMPLANT
COVER WAND RF STERILE (DRAPES) IMPLANT
CUFF TOURN SGL QUICK 34 (TOURNIQUET CUFF) ×3
CUFF TRNQT CYL 34X4.125X (TOURNIQUET CUFF) ×1 IMPLANT
DECANTER SPIKE VIAL GLASS SM (MISCELLANEOUS) ×6 IMPLANT
DERMABOND ADVANCED (GAUZE/BANDAGES/DRESSINGS) ×2
DERMABOND ADVANCED .7 DNX12 (GAUZE/BANDAGES/DRESSINGS) ×1 IMPLANT
DRAPE U-SHAPE 47X51 STRL (DRAPES) ×3 IMPLANT
DRESSING AQUACEL AG SP 3.5X10 (GAUZE/BANDAGES/DRESSINGS) ×1 IMPLANT
DRSG AQUACEL AG ADV 3.5X10 (GAUZE/BANDAGES/DRESSINGS) ×2 IMPLANT
DRSG AQUACEL AG SP 3.5X10 (GAUZE/BANDAGES/DRESSINGS) ×3
DURAPREP 26ML APPLICATOR (WOUND CARE) ×6 IMPLANT
ELECT REM PT RETURN 15FT ADLT (MISCELLANEOUS) ×3 IMPLANT
GLOVE SURG ENC MOIS LTX SZ6 (GLOVE) IMPLANT
GLOVE SURG UNDER LTX SZ7.5 (GLOVE) ×3 IMPLANT
GLOVE SURG UNDER POLY LF SZ6.5 (GLOVE) IMPLANT
GLOVE SURG UNDER POLY LF SZ7.5 (GLOVE) ×3 IMPLANT
GOWN STRL REUS W/TWL LRG LVL3 (GOWN DISPOSABLE) ×3 IMPLANT
HANDPIECE INTERPULSE COAX TIP (DISPOSABLE) ×3
HOLDER FOLEY CATH W/STRAP (MISCELLANEOUS) IMPLANT
KIT TURNOVER KIT A (KITS) ×3 IMPLANT
MANIFOLD NEPTUNE II (INSTRUMENTS) ×3 IMPLANT
NDL SAFETY ECLIPSE 18X1.5 (NEEDLE) IMPLANT
NEEDLE HYPO 18GX1.5 SHARP (NEEDLE)
NS IRRIG 1000ML POUR BTL (IV SOLUTION) ×3 IMPLANT
PACK TOTAL KNEE CUSTOM (KITS) ×3 IMPLANT
PENCIL SMOKE EVACUATOR (MISCELLANEOUS) IMPLANT
PIN DRILL FIX HALF THREAD (BIT) ×2 IMPLANT
PIN FIX SIGMA LCS THRD HI (PIN) ×2 IMPLANT
PROTECTOR NERVE ULNAR (MISCELLANEOUS) ×3 IMPLANT
SET HNDPC FAN SPRY TIP SCT (DISPOSABLE) ×1 IMPLANT
SET PAD KNEE POSITIONER (MISCELLANEOUS) ×3 IMPLANT
SUT MNCRL AB 4-0 PS2 18 (SUTURE) ×3 IMPLANT
SUT STRATAFIX PDS+ 0 24IN (SUTURE) ×3 IMPLANT
SUT VIC AB 1 CT1 36 (SUTURE) ×3 IMPLANT
SUT VIC AB 2-0 CT1 27 (SUTURE) ×9
SUT VIC AB 2-0 CT1 TAPERPNT 27 (SUTURE) ×3 IMPLANT
SYR 3ML LL SCALE MARK (SYRINGE) ×3 IMPLANT
TRAY FOLEY MTR SLVR 16FR STAT (SET/KITS/TRAYS/PACK) ×3 IMPLANT
TUBE SUCTION HIGH CAP CLEAR NV (SUCTIONS) ×3 IMPLANT
WATER STERILE IRR 1000ML POUR (IV SOLUTION) ×6 IMPLANT
WRAP KNEE MAXI GEL POST OP (GAUZE/BANDAGES/DRESSINGS) ×3 IMPLANT

## 2021-03-27 NOTE — Anesthesia Postprocedure Evaluation (Signed)
Anesthesia Post Note  Patient: Erin Wolf  Procedure(s) Performed: TOTAL KNEE ARTHROPLASTY (Right Knee)     Patient location during evaluation: PACU Anesthesia Type: General Level of consciousness: sedated Pain management: pain level not controlled Vital Signs Assessment: post-procedure vital signs reviewed and stable Respiratory status: spontaneous breathing Cardiovascular status: stable Postop Assessment: patient able to bend at knees and no headache Anesthetic complications: yes (PONV)   No complications documented.  Last Vitals:  Vitals:   03/27/21 1000 03/27/21 1015  BP: (!) 143/86 (!) 149/70  Pulse: 90 84  Resp: 10 11  Temp:    SpO2: 100% 100%    Last Pain:  Vitals:   03/27/21 1000  PainSc: [name]

## 2021-03-27 NOTE — Transfer of Care (Signed)
Immediate Anesthesia Transfer of Care Note  Patient: Erin Wolf  Procedure(s) Performed: TOTAL KNEE ARTHROPLASTY (Right Knee)  Patient Location: PACU  Anesthesia Type:General  Level of Consciousness: awake, alert , oriented and patient cooperative  Airway & Oxygen Therapy: Patient Spontanous Breathing and Patient connected to face mask oxygen  Post-op Assessment: Report given to RN, Post -op Vital signs reviewed and stable and Patient moving all extremities  Post vital signs: Reviewed and stable  Last Vitals:  Vitals Value Taken Time  BP 151/83 03/27/21 0910  Temp    Pulse 95 03/27/21 0911  Resp 11 03/27/21 0911  SpO2 100 % 03/27/21 0911  Vitals shown include unvalidated device data.  Last Pain:  Vitals:   03/27/21 0546  PainSc: 0-No pain         Complications: No complications documented.

## 2021-03-27 NOTE — Anesthesia Procedure Notes (Signed)
Anesthesia Regional Block: Adductor canal block   Pre-Anesthetic Checklist: ,, timeout performed, Correct Patient, Correct Site, Correct Laterality, Correct Procedure, Correct Position, site marked, Risks and benefits discussed,  Surgical consent,  Pre-op evaluation,  At surgeon's request and post-op pain management  Laterality: Lower and Right  Prep: chloraprep       Needles:  Injection technique: Single-shot  Needle Type: Echogenic Stimulator Needle     Needle Length: 10cm  Needle Gauge: 21   Needle insertion depth: 3 cm   Additional Needles:   Procedures:,,,, ultrasound used (permanent image in chart),,,,  Narrative:  Start time: 03/27/2021 7:05 AM End time: 03/27/2021 7:12 AM Injection made incrementally with aspirations every 5 mL.  Performed by: Personally  Anesthesiologist: Lyn Hollingshead, MD

## 2021-03-27 NOTE — Discharge Instructions (Signed)
INSTRUCTIONS AFTER JOINT REPLACEMENT   o Remove items at home which could result in a fall. This includes throw rugs or furniture in walking pathways o ICE to the affected joint every three hours while awake for 30 minutes at a time, for at least the first 3-5 days, and then as needed for pain and swelling.  Continue to use ice for pain and swelling. You may notice swelling that will progress down to the foot and ankle.  This is normal after surgery.  Elevate your leg when you are not up walking on it.   o Continue to use the breathing machine you got in the hospital (incentive spirometer) which will help keep your temperature down.  It is common for your temperature to cycle up and down following surgery, especially at night when you are not up moving around and exerting yourself.  The breathing machine keeps your lungs expanded and your temperature down.   DIET:  As you were doing prior to hospitalization, we recommend a well-balanced diet.  DRESSING / WOUND CARE / SHOWERING  Keep the surgical dressing until follow up.  The dressing is water proof, so you can shower without any extra covering.  IF THE DRESSING FALLS OFF or the wound gets wet inside, change the dressing with sterile gauze.  Please use good hand washing techniques before changing the dressing.  Do not use any lotions or creams on the incision until instructed by your surgeon.    ACTIVITY  o Increase activity slowly as tolerated, but follow the weight bearing instructions below.   o No driving for 6 weeks or until further direction given by your physician.  You cannot drive while taking narcotics.  o No lifting or carrying greater than 10 lbs. until further directed by your surgeon. o Avoid periods of inactivity such as sitting longer than an hour when not asleep. This helps prevent blood clots.  o You may return to work once you are authorized by your doctor.     WEIGHT BEARING   Weight bearing as tolerated with assist  device (walker, cane, etc) as directed, use it as long as suggested by your surgeon or therapist, typically at least 4-6 weeks.   EXERCISES  Results after joint replacement surgery are often greatly improved when you follow the exercise, range of motion and muscle strengthening exercises prescribed by your doctor. Safety measures are also important to protect the joint from further injury. Any time any of these exercises cause you to have increased pain or swelling, decrease what you are doing until you are comfortable again and then slowly increase them. If you have problems or questions, call your caregiver or physical therapist for advice.   Rehabilitation is important following a joint replacement. After just a few days of immobilization, the muscles of the leg can become weakened and shrink (atrophy).  These exercises are designed to build up the tone and strength of the thigh and leg muscles and to improve motion. Often times heat used for twenty to thirty minutes before working out will loosen up your tissues and help with improving the range of motion but do not use heat for the first two weeks following surgery (sometimes heat can increase post-operative swelling).   These exercises can be done on a training (exercise) mat, on the floor, on a table or on a bed. Use whatever works the best and is most comfortable for you.    Use music or television while you are exercising so that   the exercises are a pleasant break in your day. This will make your life better with the exercises acting as a break in your routine that you can look forward to.   Perform all exercises about fifteen times, three times per day or as directed.  You should exercise both the operative leg and the other leg as well.  Exercises include:   . Quad Sets - Tighten up the muscle on the front of the thigh (Quad) and hold for 5-10 seconds.   . Straight Leg Raises - With your knee straight (if you were given a brace, keep it on),  lift the leg to 60 degrees, hold for 3 seconds, and slowly lower the leg.  Perform this exercise against resistance later as your leg gets stronger.  . Leg Slides: Lying on your back, slowly slide your foot toward your buttocks, bending your knee up off the floor (only go as far as is comfortable). Then slowly slide your foot back down until your leg is flat on the floor again.  . Angel Wings: Lying on your back spread your legs to the side as far apart as you can without causing discomfort.  . Hamstring Strength:  Lying on your back, push your heel against the floor with your leg straight by tightening up the muscles of your buttocks.  Repeat, but this time bend your knee to a comfortable angle, and push your heel against the floor.  You may put a pillow under the heel to make it more comfortable if necessary.   A rehabilitation program following joint replacement surgery can speed recovery and prevent re-injury in the future due to weakened muscles. Contact your doctor or a physical therapist for more information on knee rehabilitation.    CONSTIPATION  Constipation is defined medically as fewer than three stools per week and severe constipation as less than one stool per week.  Even if you have a regular bowel pattern at home, your normal regimen is likely to be disrupted due to multiple reasons following surgery.  Combination of anesthesia, postoperative narcotics, change in appetite and fluid intake all can affect your bowels.   YOU MUST use at least one of the following options; they are listed in order of increasing strength to get the job done.  They are all available over the counter, and you may need to use some, POSSIBLY even all of these options:    Drink plenty of fluids (prune juice may be helpful) and high fiber foods Colace 100 mg by mouth twice a day  Senokot for constipation as directed and as needed Dulcolax (bisacodyl), take with full glass of water  Miralax (polyethylene glycol)  once or twice a day as needed.  If you have tried all these things and are unable to have a bowel movement in the first 3-4 days after surgery call either your surgeon or your primary doctor.    If you experience loose stools or diarrhea, hold the medications until you stool forms back up.  If your symptoms do not get better within 1 week or if they get worse, check with your doctor.  If you experience "the worst abdominal pain ever" or develop nausea or vomiting, please contact the office immediately for further recommendations for treatment.   ITCHING:  If you experience itching with your medications, try taking only a single pain pill, or even half a pain pill at a time.  You can also use Benadryl over the counter for itching or also to   help with sleep.   TED HOSE STOCKINGS:  Use stockings on both legs until for at least 2 weeks or as directed by physician office. They may be removed at night for sleeping.  MEDICATIONS:  See your medication summary on the "After Visit Summary" that nursing will review with you.  You may have some home medications which will be placed on hold until you complete the course of blood thinner medication.  It is important for you to complete the blood thinner medication as prescribed.  PRECAUTIONS:  If you experience chest pain or shortness of breath - call 911 immediately for transfer to the hospital emergency department.   If you develop a fever greater that 101 F, purulent drainage from wound, increased redness or drainage from wound, foul odor from the wound/dressing, or calf pain - CONTACT YOUR SURGEON.                                                   FOLLOW-UP APPOINTMENTS:  If you do not already have a post-op appointment, please call the office for an appointment to be seen by your surgeon.  Guidelines for how soon to be seen are listed in your "After Visit Summary", but are typically between 1-4 weeks after surgery.  OTHER INSTRUCTIONS:   Knee  Replacement:  Do not place pillow under knee, focus on keeping the knee straight while resting. CPM instructions: 0-90 degrees, 2 hours in the morning, 2 hours in the afternoon, and 2 hours in the evening. Place foam block, curve side up under heel at all times except when in CPM or when walking.  DO NOT modify, tear, cut, or change the foam block in any way.  POST-OPERATIVE OPIOID TAPER INSTRUCTIONS: . It is important to wean off of your opioid medication as soon as possible. If you do not need pain medication after your surgery it is ok to stop day one. . Opioids include: o Codeine, Hydrocodone(Norco, Vicodin), Oxycodone(Percocet, oxycontin) and hydromorphone amongst others.  . Long term and even short term use of opiods can cause: o Increased pain response o Dependence o Constipation o Depression o Respiratory depression o And more.  . Withdrawal symptoms can include o Flu like symptoms o Nausea, vomiting o And more . Techniques to manage these symptoms o Hydrate well o Eat regular healthy meals o Stay active o Use relaxation techniques(deep breathing, meditating, yoga) . Do Not substitute Alcohol to help with tapering . If you have been on opioids for less than two weeks and do not have pain than it is ok to stop all together.  . Plan to wean off of opioids o This plan should start within one week post op of your joint replacement. o Maintain the same interval or time between taking each dose and first decrease the dose.  o Cut the total daily intake of opioids by one tablet each day o Next start to increase the time between doses. o The last dose that should be eliminated is the evening dose.     MAKE SURE YOU:  . Understand these instructions.  . Get help right away if you are not doing well or get worse.    Thank you for letting us be a part of your medical care team.  It is a privilege we respect greatly.  We hope these instructions will   help you stay on track for a fast  and full recovery!  Information on my medicine - XARELTO (Rivaroxaban)  This medication education was reviewed with me or my healthcare representative as part of my discharge preparation.    Why was Xarelto prescribed for you? Xarelto was prescribed for you to reduce the risk of blood clots forming after orthopedic surgery. The medical term for these abnormal blood clots is venous thromboembolism (VTE).  What do you need to know about xarelto ? Take your Xarelto ONCE DAILY at the same time every day. You may take it either with or without food.  If you have difficulty swallowing the tablet whole, you may crush it and mix in applesauce just prior to taking your dose.  Take Xarelto exactly as prescribed by your doctor and DO NOT stop taking Xarelto without talking to the doctor who prescribed the medication.  Stopping without other VTE prevention medication to take the place of Xarelto may increase your risk of developing a clot.  After discharge, you should have regular check-up appointments with your healthcare provider that is prescribing your Xarelto.    What do you do if you miss a dose? If you miss a dose, take it as soon as you remember on the same day then continue your regularly scheduled once daily regimen the next day. Do not take two doses of Xarelto on the same day.   Important Safety Information A possible side effect of Xarelto is bleeding. You should call your healthcare provider right away if you experience any of the following: ? Bleeding from an injury or your nose that does not stop. ? Unusual colored urine (red or dark brown) or unusual colored stools (red or black). ? Unusual bruising for unknown reasons. ? A serious fall or if you hit your head (even if there is no bleeding).  Some medicines may interact with Xarelto and might increase your risk of bleeding while on Xarelto. To help avoid this, consult your healthcare provider or pharmacist prior to  using any new prescription or non-prescription medications, including herbals, vitamins, non-steroidal anti-inflammatory drugs (NSAIDs) and supplements.  This website has more information on Xarelto: [email or web address]

## 2021-03-27 NOTE — Op Note (Signed)
NAME:  Erin Wolf RECORD NO.:  [id number]                             FACILITY:  [facility]      PHYSICIAN:  Pietro Cassis. Alvan Dame, M.D.  DATE OF BIRTH:  Feb 23, 1956      DATE OF PROCEDURE:  03/27/2021                                     OPERATIVE REPORT         PREOPERATIVE DIAGNOSIS:  Right knee osteoarthritis.      POSTOPERATIVE DIAGNOSIS:  Right knee osteoarthritis.      FINDINGS:  The patient was noted to have complete loss of cartilage and   bone-on-bone arthritis with associated osteophytes in all three compartments of   the knee with a significant synovitis and associated effusion.  The patient had failed months of conservative treatment including medications, injection therapy, activity modification.     PROCEDURE:  Right total knee replacement.      COMPONENTS USED:  DePuy Attune rotating platform posterior stabilized knee   system, a size 4 femur, 4 tibia, size 8 mm PS AOX insert, and 35 anatomic patellar   button.      SURGEON:  Pietro Cassis. Alvan Dame, M.D.      ASSISTANT:  Costella Hatcher, PA-C.      ANESTHESIA:  Regional and Spinal.      SPECIMENS:  None.      COMPLICATION:  None.      DRAINS:  None.  EBL: <100 cc      TOURNIQUET TIME:   Total Tourniquet Time Documented: Thigh (Right) - 31 minutes Total: Thigh (Right) - 31 minutes       The patient was stable to the recovery room.      INDICATION FOR PROCEDURE:  Erin Wolf is a 65 y.o. female patient of   mine.  The patient had been seen, evaluated, and treated for months conservatively in the   office with medication, activity modification, and injections.  The patient had   radiographic changes of bone-on-bone arthritis with endplate sclerosis and osteophytes noted.  Based on the radiographic changes and failed conservative measures, the patient   decided to proceed with definitive treatment, total knee replacement.  Risks of infection, DVT, component failure, need for revision  surgery, neurovascular injury were reviewed in the office setting.  The postop course was reviewed stressing the efforts to maximize post-operative satisfaction and function.  Consent was obtained for benefit of pain   relief.      PROCEDURE IN DETAIL:  The patient was brought to the operative theater.   Once adequate anesthesia, preoperative antibiotics, 2 gm of Ancef,1 gm of Tranexamic Acid, and 10 mg of Decadron administered, the patient was positioned supine with a right thigh tourniquet placed.  The  right lower extremity was prepped and draped in sterile fashion.  A time-   out was performed identifying the patient, planned procedure, and the appropriate extremity.      The right lower extremity was placed in the [facility] leg holder.  The leg was   exsanguinated, tourniquet elevated to 250 mmHg.  A midline incision was   made followed by  median parapatellar arthrotomy.  Following initial   exposure, attention was first directed to the patella.  Precut   measurement was noted to be 21 mm.  I resected down to 13-14 mm and used a   35 anatomic patellar button to restore patellar height as well as cover the cut surface.      The lug holes were drilled and a metal shim was placed to protect the   patella from retractors and saw blade during the procedure.      At this point, attention was now directed to the femur.  The femoral   canal was opened with a drill, irrigated to try to prevent fat emboli.  An   intramedullary rod was passed at 5 degrees valgus, 9 mm of bone was   resected off the distal femur.  Following this resection, the tibia was   subluxated anteriorly.  Using the extramedullary guide, 2 mm of bone was resected off   the proximal medial tibia.  We confirmed the gap would be   stable medially and laterally with a size 6 spacer block as well as confirmed that the tibial cut was perpendicular in the coronal plane, checking with an alignment rod.      Once this was done, I sized  the femur to be a size 4 in the anterior-   posterior dimension, chose a standard component based on medial and   lateral dimension.  The size 4 rotation block was then pinned in   position anterior referenced using the C-clamp to set rotation.  The   anterior, posterior, and  chamfer cuts were made without difficulty nor   notching making certain that I was along the anterior cortex to help   with flexion gap stability.      The final box cut was made off the lateral aspect of distal femur.      At this point, the tibia was sized to be a size 4.  The size 4 tray was   then pinned in position through the medial third of the tubercle,   drilled, and keel punched.  Trial reduction was now carried with a 4 femur,  4 tibia, a size 8 mm PS insert, and the 35 anatomic patella botton.  The knee was brought to full extension with good flexion stability with the patella   tracking through the trochlea without application of pressure.  Given   all these findings the trial components removed.  Final components were   opened and cement was mixed.  The knee was irrigated with normal saline solution and pulse lavage.  The synovial lining was   then injected with 30 cc of 0.25% Marcaine with epinephrine, 1 cc of Toradol and 30 cc of NS for a total of 61 cc.     Final implants were then cemented onto cleaned and dried cut surfaces of bone with the knee brought to extension with a size 8 mm PS trial insert.      Once the cement had fully cured, excess cement was removed   throughout the knee.  I confirmed that I was satisfied with the range of   motion and stability, and the final size 8 mm PS AOX insert was chosen.  It was   placed into the knee.      The tourniquet had been let down at 31 minutes.  No significant   hemostasis was required.  The extensor mechanism was then reapproximated using #1 Vicryl and #1 Stratafix  sutures with the knee   in flexion.  The   remaining wound was closed with 2-0  Vicryl and running 4-0 Monocryl.   The knee was cleaned, dried, dressed sterilely using Dermabond and   Aquacel dressing.  The patient was then   brought to recovery room in stable condition, tolerating the procedure   well.   Please note that Physician Assistant, Costella Hatcher, PA-C was present for the entirety of the case, and was utilized for pre-operative positioning, peri-operative retractor management, general facilitation of the procedure and for primary wound closure at the end of the case.              Pietro Cassis Alvan Dame, M.D.    03/27/2021 8:43 AM

## 2021-03-27 NOTE — Anesthesia Procedure Notes (Signed)
Procedure Name: LMA Insertion Date/Time: 03/27/2021 7:23 AM Performed by: Victoriano Lain, CRNA Pre-anesthesia Checklist: Patient identified, Emergency Drugs available, Suction available, Patient being monitored and Timeout performed Patient Re-evaluated:Patient Re-evaluated prior to induction Oxygen Delivery Method: Circle system utilized Preoxygenation: Pre-oxygenation with 100% oxygen Induction Type: IV induction Number of attempts: 1 Placement Confirmation: positive ETCO2 and breath sounds checked- equal and bilateral Tube secured with: Tape Dental Injury: Teeth and Oropharynx as per pre-operative assessment

## 2021-03-27 NOTE — Plan of Care (Signed)
Discussed with patient about plan of care for post-op day 0. ° ° °Will continue to monitor patient.  ° ° °SWhittemore, RN ° °

## 2021-03-27 NOTE — Interval H&P Note (Signed)
History and Physical Interval Note:  03/27/2021 7:08 AM  Erin Wolf  has presented today for surgery, with the diagnosis of Right knee osteoarthritis.  The various methods of treatment have been discussed with the patient and family. After consideration of risks, benefits and other options for treatment, the patient has consented to  Procedure(s) with comments: TOTAL KNEE ARTHROPLASTY (Right) - 70 mins as a surgical intervention.  The patient's history has been reviewed, patient examined, no change in status, stable for surgery.  I have reviewed the patient's chart and labs.  Questions were answered to the patient's satisfaction.     Mauri Pole

## 2021-03-27 NOTE — Care Plan (Signed)
Ortho Bundle Case Management Note  Patient Details  Name: Erin Wolf MRN: [id number] Date of Birth: Aug 26, 1956                  R TKA on 03-27-21 DCP: Home with husband. 1 story home with 4 ste. DME: No needs. Has a RW and 3-in-1. PT: EO. PT eval to be scheduled on 03-30-21.   DME Arranged:  N/A DME Agency:     HH Arranged:    HH Agency:     Additional Comments: Please contact me with any questions of if this plan should need to change.  Marianne Sofia, RN,CCM [facility]  [phone] 03/27/2021, 3:01 PM

## 2021-03-27 NOTE — Evaluation (Signed)
Physical Therapy Evaluation Patient Details Name: Erin Wolf MRN: [id number] DOB: [birth year] Today's Date: 03/27/2021   History of Present Illness  Patient is 65 y.o. female s/p Rt TKA on 03/27/21 with PMH significant for OA, SLE, HLD, HTN, hepatitis, GERD, fibromyalgia, DM, LBP. depression, anxiety, Lt TKA on 12/19/20.    Clinical Impression  Erin Wolf is a 65 y.o. female POD 0 s/p Rt TKA. Patient reports independence with mobility at baseline. Patient is now limited by functional impairments (see PT problem list below) and requires min assist for transfers and gait with RW. Patient was able to ambulate ~70 feet with RW and min assist. Patient instructed in exercise to facilitate circulation to manage edema and reduce risk of DVT. Patient will benefit from continued skilled PT interventions to address impairments and progress towards PLOF. Acute PT will follow to progress mobility and stair training in preparation for safe discharge home.     Follow Up Recommendations Follow surgeon's recommendation for DC plan and follow-up therapies    Equipment Recommendations  None recommended by PT    Recommendations for Other Services       Precautions / Restrictions Precautions Precautions: Fall Restrictions Weight Bearing Restrictions: No Other Position/Activity Restrictions: WBAT      Mobility  Bed Mobility Overal bed mobility: Needs Assistance Bed Mobility: Supine to Sit     Supine to sit: Min guard;HOB elevated     General bed mobility comments: cues to use bed rail for pivot, pt able to bring LE's off EOB full withut assist. guarding for safety.    Transfers Overall transfer level: Needs assistance Equipment used: Rolling walker (2 wheeled) Transfers: Sit to/from [facility] Sit to Stand: Min assist Stand pivot transfers: Min assist       General transfer comment: cues for hand placement/technique with RW to rise form EOB and toilet. cues for safe  use of gra bar and to extend Rt LE when sitting to avoid excessive flexion.  Ambulation/Gait Ambulation/Gait assistance: Min assist Gait Distance (Feet): 70 Feet Assistive device: Rolling walker (2 wheeled) Gait Pattern/deviations: Step-to pattern;Decreased stride length;Decreased weight shift to right Gait velocity: decr   General Gait Details: cues for safe step pattern and position of RW, pt wit tendency to step too close to RW and assist needed to position. no overt LOB or buckling at Rt knee.  Stairs            Wheelchair Mobility    Modified Rankin (Stroke Patients Only)       Balance Overall balance assessment: Needs assistance Sitting-balance support: Feet supported Sitting balance-Leahy Scale: Good     Standing balance support: During functional activity;Bilateral upper extremity supported Standing balance-Leahy Scale: Poor                               Pertinent Vitals/Pain Pain Assessment: 0-10 Pain Score: 7  Pain Location: Rt knee Pain Descriptors / Indicators: Aching;Discomfort Pain Intervention(s): Limited activity within patient's tolerance;Monitored during session;Repositioned;Ice applied;Premedicated before session    Home Living Family/patient expects to be discharged to:: Private residence Living Arrangements: Spouse/significant other Available Help at Discharge: Family Type of Home: House Home Access: Stairs to enter Entrance Stairs-Rails: Right;Left;Can reach both Entrance Stairs-Number of Steps: [place]: One level Home Equipment: Environmental consultant - 2 wheels;Walker - 4 wheels;Cane - single point;Bedside commode;Grab bars - tub/shower;Grab bars - toilet      Prior Function Level of Independence:  Independent with assistive device(s)         Comments: walked with SPC     Hand Dominance   Dominant Hand: Right    Extremity/Trunk Assessment   Upper Extremity Assessment Upper Extremity Assessment: Overall WFL for tasks  assessed    Lower Extremity Assessment Lower Extremity Assessment: RLE deficits/detail RLE Deficits / Details: good quad activation, no extensor lag with SLR RLE Sensation: WNL RLE Coordination: WNL    Cervical / Trunk Assessment Cervical / Trunk Assessment: Normal  Communication   Communication: No difficulties  Cognition Arousal/Alertness: Awake/alert Behavior During Therapy: WFL for tasks assessed/performed Overall Cognitive Status: Within Functional Limits for tasks assessed                                        General Comments      Exercises Total Joint Exercises Ankle Circles/Pumps: AROM;Both;20 reps;Seated   Assessment/Plan    PT Assessment Patient needs continued PT services  PT Problem List Decreased range of motion;Decreased activity tolerance;Decreased strength;Decreased balance;Decreased mobility;Decreased knowledge of use of DME;Decreased knowledge of precautions;Pain       PT Treatment Interventions DME instruction;Gait training;Stair training;Functional mobility training;Therapeutic activities;Therapeutic exercise;Balance training;Patient/family education    PT Goals (Current goals can be found in the Care Plan section)  Acute Rehab PT Goals Patient Stated Goal: regain independence PT Goal Formulation: With patient Time For Goal Achievement: 04/03/21 Potential to Achieve Goals: Good    Frequency 7X/week   Barriers to discharge        Co-evaluation               AM-PAC PT "6 Clicks" Mobility  Outcome Measure Help needed turning from your back to your side while in a flat bed without using bedrails?: A Little Help needed moving from lying on your back to sitting on the side of a flat bed without using bedrails?: A Little Help needed moving to and from a bed to a chair (including a wheelchair)?: A Little Help needed standing up from a chair using your arms (e.g., wheelchair or bedside chair)?: A Little Help needed to walk in  hospital room?: A Little Help needed climbing 3-5 steps with a railing? : A Little 6 Click Score: 18    End of Session Equipment Utilized During Treatment: Gait belt Activity Tolerance: Patient tolerated treatment well Patient left: in chair;with call bell/phone within reach;with chair alarm set Nurse Communication: Mobility status PT Visit Diagnosis: Muscle weakness (generalized) (M62.81);Difficulty in walking, not elsewhere classified (R26.2)    Time: 7096-2836 PT Time Calculation (min) (ACUTE ONLY): 43 min   Charges:   PT Evaluation $PT Eval Low Complexity: 1 Low PT Treatments $Gait Training: 8-22 mins $Therapeutic Activity: 8-22 mins        Verner Mould, DPT Acute Rehabilitation Services Office [phone] Pager [phone]    Jacques Navy 03/27/2021, 1:05 PM

## 2021-03-28 ENCOUNTER — Encounter (HOSPITAL_COMMUNITY): Payer: Self-pay | Admitting: Orthopedic Surgery

## 2021-03-28 DIAGNOSIS — Z96652 Presence of left artificial knee joint: Secondary | ICD-10-CM | POA: Diagnosis not present

## 2021-03-28 DIAGNOSIS — M1711 Unilateral primary osteoarthritis, right knee: Secondary | ICD-10-CM | POA: Diagnosis not present

## 2021-03-28 DIAGNOSIS — Z7901 Long term (current) use of anticoagulants: Secondary | ICD-10-CM | POA: Diagnosis not present

## 2021-03-28 DIAGNOSIS — Z79899 Other long term (current) drug therapy: Secondary | ICD-10-CM | POA: Diagnosis not present

## 2021-03-28 DIAGNOSIS — Z91040 Latex allergy status: Secondary | ICD-10-CM | POA: Diagnosis not present

## 2021-03-28 DIAGNOSIS — E119 Type 2 diabetes mellitus without complications: Secondary | ICD-10-CM | POA: Diagnosis not present

## 2021-03-28 DIAGNOSIS — I10 Essential (primary) hypertension: Secondary | ICD-10-CM | POA: Diagnosis not present

## 2021-03-28 LAB — CBC
HCT: 33.2 % — ABNORMAL LOW (ref 36.0–46.0)
Hemoglobin: 10.1 g/dL — ABNORMAL LOW (ref 12.0–15.0)
MCH: 25.4 pg — ABNORMAL LOW (ref 26.0–34.0)
MCHC: 30.4 g/dL (ref 30.0–36.0)
MCV: 83.4 fL (ref 80.0–100.0)
Platelets: 284 10*3/uL (ref 150–400)
RBC: 3.98 MIL/uL (ref 3.87–5.11)
RDW: 15.9 % — ABNORMAL HIGH (ref 11.5–15.5)
WBC: 10.8 10*3/uL — ABNORMAL HIGH (ref 4.0–10.5)
nRBC: 0 % (ref 0.0–0.2)

## 2021-03-28 LAB — BASIC METABOLIC PANEL
Anion gap: 10 (ref 5–15)
BUN: 17 mg/dL (ref 8–23)
CO2: 26 mmol/L (ref 22–32)
Calcium: 8.6 mg/dL — ABNORMAL LOW (ref 8.9–10.3)
Chloride: 100 mmol/L (ref 98–111)
Creatinine, Ser: 0.67 mg/dL (ref 0.44–1.00)
GFR, Estimated: >60 mL/min (ref >60)
Glucose, Bld: 147 mg/dL — ABNORMAL HIGH (ref 70–99)
Potassium: 3.6 mmol/L (ref 3.5–5.1)
Sodium: 136 mmol/L (ref 135–145)

## 2021-03-28 MED ORDER — RIVAROXABAN 10 MG PO TABS: 10.0000 mg | ORAL_TABLET | Freq: Every day | ORAL | 0 refills | Status: DC

## 2021-03-28 MED ORDER — OXYCODONE HCL 10 MG PO TABS: 20.0000 mg | ORAL_TABLET | ORAL | Status: DC | PRN

## 2021-03-28 NOTE — Progress Notes (Signed)
Physical Therapy Treatment Patient Details Name: Erin Wolf MRN: [id number] DOB: 1956-04-15 Today's Date: 03/28/2021    History of Present Illness Patient is 64 y.o. female s/p Rt TKA on 03/27/21 with PMH significant for OA, SLE, HLD, HTN, hepatitis, GERD, fibromyalgia, DM, LBP. depression, anxiety, Lt TKA on 12/19/20.    PT Comments    Progressing well. Pt reports pain as moderate but tolerable. Reviewed/practiced exercises, gait training, and stair training. All education completed. Pt is eager to d/c home. Okay to d/c from PT standpoint.    Follow Up Recommendations  Follow surgeon's recommendation for DC plan and follow-up therapies     Equipment Recommendations  None recommended by PT    Recommendations for Other Services       Precautions / Restrictions Precautions Precautions: Fall Restrictions Weight Bearing Restrictions: No RLE Weight Bearing: Weight bearing as tolerated    Mobility  Bed Mobility Overal bed mobility: Modified Independent                  Transfers Overall transfer level: Needs assistance Equipment used: Rolling walker (2 wheeled) Transfers: Sit to/from Stand Sit to Stand: Supervision         General transfer comment: for safety.  Ambulation/Gait Ambulation/Gait assistance: Supervision Gait Distance (Feet): 125 Feet Assistive device: Rolling walker (2 wheeled) Gait Pattern/deviations: Step-through pattern;Decreased stride length     General Gait Details: supv for safety. no c/o lightheadedness. pain tolerable per pt.   Stairs Stairs: Yes Stairs assistance: Min guard Stair Management: Step to pattern;Forwards;Two rails Number of Stairs: 5 General stair comments: up and over portable stairs x 2. cues for safety, technique, sequence. min guard for safety.   Wheelchair Mobility    Modified Rankin (Stroke Patients Only)       Balance Overall balance assessment: Needs assistance           Standing balance-Leahy  Scale: Fair                              Cognition Arousal/Alertness: Awake/alert Behavior During Therapy: WFL for tasks assessed/performed Overall Cognitive Status: Within Functional Limits for tasks assessed                                        Exercises Total Joint Exercises Ankle Circles/Pumps: AROM;Both;10 reps Quad Sets: AROM;Both;10 reps Heel Slides: AROM;Right;10 reps Hip ABduction/ADduction: AROM;Right;10 reps Straight Leg Raises: AROM;AAROM;Right;10 reps Goniometric ROM: ~5-70 degrees    General Comments        Pertinent Vitals/Pain Pain Assessment: 0-10 Pain Score: 7  Pain Location: R knee Pain Descriptors / Indicators: Discomfort;Sore Pain Intervention(s): Monitored during session;Repositioned    Home Living                      Prior Function            PT Goals (current goals can now be found in the care plan section) Progress towards PT goals: Progressing toward goals    Frequency    7X/week      PT Plan Current plan remains appropriate    Co-evaluation              AM-PAC PT "6 Clicks" Mobility   Outcome Measure  Help needed turning from your back to your side while in a flat bed without using bedrails?:  A Little Help needed moving from lying on your back to sitting on the side of a flat bed without using bedrails?: A Little Help needed moving to and from a bed to a chair (including a wheelchair)?: A Little Help needed standing up from a chair using your arms (e.g., wheelchair or bedside chair)?: A Little Help needed to walk in hospital room?: A Little Help needed climbing 3-5 steps with a railing? : A Little 6 Click Score: 18    End of Session Equipment Utilized During Treatment: Gait belt Activity Tolerance: Patient tolerated treatment well Patient left: in chair;with call bell/phone within reach   PT Visit Diagnosis: Other abnormalities of gait and mobility (R26.89)     Time:  3785-8850 PT Time Calculation (min) (ACUTE ONLY): 26 min  Charges:  $Gait Training: 8-22 mins $Therapeutic Exercise: 8-22 mins                         Doreatha Massed, PT Acute Rehabilitation  Office: [phone] Pager: [phone]

## 2021-03-28 NOTE — TOC Transition Note (Signed)
Transition of Care [facility]) - CM/SW Discharge Note   Patient Details  Name: Erin Wolf MRN: [id number] Date of Birth: 03-21-1956  [facility] CM/SW Contact:  Lennart Pall, LCSW Phone Number: 03/28/2021, 1:28 PM   Clinical Narrative:    Met briefly with pt and confirmed she has all needed DME. Plan for OPPT via [facility].  NO TOC needs.   Final next level of care: OP Rehab Barriers to Discharge: No Barriers Identified   Patient Goals and CMS Choice Patient states their goals for this hospitalization and ongoing recovery are:: return home      Discharge Placement                       Discharge Plan and Services                DME Arranged: N/A DME Agency: NA                  Social Determinants of Health (SDOH) Interventions     Readmission Risk Interventions No flowsheet data found.

## 2021-03-28 NOTE — Progress Notes (Signed)
Subjective: 1 Day Post-Op Procedure(s) (LRB): TOTAL KNEE ARTHROPLASTY (Right) Patient reports pain as moderate.   Patient seen in rounds for Dr. Alvan Dame. Patient is well, and has had no acute complaints or problems. No acute events overnight. Foley catheter removed. Patient ambulated 70 feet with PT. Patient reports pain stayed consistently at a 7-8/10 yesterday. We will continue therapy today.   Objective: Vital signs in last 24 hours: Temp:  [97.6 F (36.4 C)-98.1 F (36.7 C)] 97.6 F (36.4 C) (06/08 0539) Pulse Rate:  [72-96] 72 (06/08 0539) Resp:  [10-22] 16 (06/08 0539) BP: (115-157)/(67-95) 116/68 (06/08 0539) SpO2:  [93 %-100 %] 100 % (06/08 0539)  Intake/Output from previous day:  Intake/Output Summary (Last 24 hours) at 03/28/2021 0845 Last data filed at 03/28/2021 0600 Gross per 24 hour  Intake 2050 ml  Output 400 ml  Net 1650 ml     Intake/Output this shift: No intake/output data recorded.  Labs: Recent Labs    03/28/21 0318  HGB 10.1*   Recent Labs    03/28/21 0318  WBC 10.8*  RBC 3.98  HCT 33.2*  PLT 284   Recent Labs    03/28/21 0318  NA 136  K 3.6  CL 100  CO2 26  BUN 17  CREATININE 0.67  GLUCOSE 147*  CALCIUM 8.6*   No results for input(s): LABPT, INR in the last 72 hours.  Exam: General - Patient is Alert and Oriented Extremity - Neurologically intact Sensation intact distally Intact pulses distally Dorsiflexion/Plantar flexion intact Dressing - dressing C/D/I Motor Function - intact, moving foot and toes well on exam.   Past Medical History:  Diagnosis Date  . Anxiety   . Arthritis   . Chronic abdominal pain   . Chronic lower back pain   . Chronic nausea   . Depression   . Diabetes mellitus without complication ([place])   . Diverticulosis   . Fibromyalgia   . Frequency of urination   . GERD (gastroesophageal reflux disease)   . Headache   . Hematuria   . Hepatitis   . Hepatitis C antibody positive in blood    per pt  told by pcp  . History of panic attacks   . History of syncope    hx recurrent syncope --- per epic domentation non-cardiac , orthostatic hypotension, anxiety, dehydration  . History of uterine fibroid   . HTN (hypertension), benign   . Hyperlipidemia 01/03/2012  . IBS (irritable bowel syndrome)   . Myalgia   . Seizure disorder ([place]) followed by pcp until new neurologist appr. in jan 2019   "started having them in my 20's" ,  petit mal ----  per pt last seizure one 2015  . SLE (systemic lupus erythematosus) [facility])    rheumatologist-- dr Gerilyn Nestle (consult 11-12-2016) having work-up done  . Ulcerative colitis    followed by dr Penelope Coop at [place]  . Wears glasses     Assessment/Plan: 1 Day Post-Op Procedure(s) (LRB): TOTAL KNEE ARTHROPLASTY (Right) Active Problems:   S/P total knee arthroplasty, right  Estimated body mass index is 34.61 kg/m as calculated from the following:   Height as of this encounter: 5\' 5"  (1.651 m).   Weight as of this encounter: 94.3 kg. Advance diet Up with therapy D/C IV fluids  Anticipated LOS equal to or greater than 2 midnights due to - Age 7 and older with one or more of the following:  - Obesity  - Expected need for hospital services (PT, OT, Nursing)  required for safe  discharge  - Anticipated need for postoperative skilled nursing care or inpatient rehab  - Active co-morbidities: Chronic pain requiring opiods OR   - Unanticipated findings during/Post Surgery: None  - Patient is a high risk of re-admission due to: None    DVT Prophylaxis - Xarelto Weight bearing as tolerated.  Plan is to go Home after hospital stay. Our biggest focus with her will be adequate post-operative pain control. We have discussed pre-operatively and today how difficult this may be due to her chronic pain medications. Although she is moving very well, and ambulated a good distance with PT, I am concerned about how she will tolerate the pain at this point should she go  home. I do want her to focus today on minimizing IV medications as she can so that we have a realistic idea of what the home environment will be like. After discussion with Dr. Alvan Dame, we will try to maintain current medications. She takes Oxycodone 10 mg q8h at home, and has been receiving 20 mg q4h here.   Per [facility], she received her usual prescription for #180 Oxycodone 10 mg tablets on 6/2.   Griffith Citron, PA-C Orthopedic Surgery [phone] 03/28/2021, 8:45 AM

## 2021-04-02 DIAGNOSIS — M25561 Pain in right knee: Secondary | ICD-10-CM | POA: Insufficient documentation

## 2021-04-03 NOTE — Discharge Summary (Signed)
Physician Discharge Summary   Patient ID: Erin Wolf MRN: [id number] DOB/AGE: 1956-05-12 65 y.o.  Admit date: 03/27/2021 Discharge date: 03/28/2021  Primary Diagnosis: Right knee osteoarthritis.  Admission Diagnoses:  Past Medical History:  Diagnosis Date   Anxiety    Arthritis    Chronic abdominal pain    Chronic lower back pain    Chronic nausea    Depression    Diabetes mellitus without complication (HCC)    Diverticulosis    Fibromyalgia    Frequency of urination    GERD (gastroesophageal reflux disease)    Headache    Hematuria    Hepatitis    Hepatitis C antibody positive in blood    per pt told by pcp   History of panic attacks    History of syncope    hx recurrent syncope --- per epic domentation non-cardiac , orthostatic hypotension, anxiety, dehydration   History of uterine fibroid    HTN (hypertension), benign    Hyperlipidemia 01/03/2012   IBS (irritable bowel syndrome)    Myalgia    Seizure disorder ([place]) followed by pcp until new neurologist appr. in jan 2019   "started having them in my 20's" ,  petit mal ----  per pt last seizure one 2015   SLE (systemic lupus erythematosus) [facility])    rheumatologist-- dr Gerilyn Nestle (consult 11-12-2016) having work-up done   Ulcerative colitis    followed by dr Penelope Coop at [facility]   Wears glasses    Discharge Diagnoses:   Active Problems:   S/P total knee arthroplasty, right  Estimated body mass index is 34.61 kg/m as calculated from the following:   Height as of this encounter: _0  (1.651 m).   Weight as of this encounter: 94.3 kg.  Procedure:  Procedure(s) (LRB): TOTAL KNEE ARTHROPLASTY (Right)   Consults: None  HPI: Erin Wolf is a 65 y.o. female patient of  mine.  The patient had been seen, evaluated, and treated for months conservatively in the  office with medication, activity modification, and injections.  The patient had  radiographic changes of bone-on-bone arthritis with endplate sclerosis  and osteophytes noted.  Based on the radiographic changes and failed conservative measures, the patient  decided to proceed with definitive treatment, total knee replacement.  Risks of infection, DVT, component failure, need for revision surgery, neurovascular injury were reviewed in the office setting.  The postop course was reviewed stressing the efforts to maximize post-operative satisfaction and function.  Consent was obtained for benefit of pain  relief.  Laboratory Data: Admission on 03/27/2021, Discharged on 03/28/2021  Component Date Value Ref Range Status   Glucose-Capillary 03/27/2021 172 (A) 70 - 99 mg/dL Final   Glucose reference range applies only to samples taken after fasting for at least 8 hours.   WBC 03/28/2021 10.8 (A) 4.0 - 10.5 K/uL Final   RBC 03/28/2021 3.98  3.87 - 5.11 MIL/uL Final   Hemoglobin 03/28/2021 10.1 (A) 12.0 - 15.0 g/dL Final   HCT 03/28/2021 33.2 (A) 36.0 - 46.0 % Final   MCV 03/28/2021 83.4  80.0 - 100.0 fL Final   MCH 03/28/2021 25.4 (A) 26.0 - 34.0 pg Final   MCHC 03/28/2021 30.4  30.0 - 36.0 g/dL Final   RDW 03/28/2021 15.9 (A) 11.5 - 15.5 % Final   Platelets 03/28/2021 284  150 - 400 K/uL Final   nRBC 03/28/2021 0.0  0.0 - 0.2 % Final   Performed at [facility], [address] [place],  [place] [address]   Sodium 03/28/2021 136  135 - 145 mmol/L Final   Potassium 03/28/2021 3.6  3.5 - 5.1 mmol/L Final   Chloride 03/28/2021 100  98 - 111 mmol/L Final   CO2 03/28/2021 26  22 - 32 mmol/L Final   Glucose, Bld 03/28/2021 147 (A) 70 - 99 mg/dL Final   Glucose reference range applies only to samples taken after fasting for at least 8 hours.   BUN 03/28/2021 17  8 - 23 mg/dL Final   Creatinine, Ser 03/28/2021 0.67  0.44 - 1.00 mg/dL Final   Calcium 03/28/2021 8.6 (A) 8.9 - 10.3 mg/dL Final   GFR, Estimated 03/28/2021 >60  >60 mL/min Final   Comment: (NOTE) Calculated using the CKD-EPI Creatinine Equation (2021)    Anion gap  03/28/2021 10  5 - 15 Final   Performed at [facility], [place] [address] [place], [place] [address]  Hospital Outpatient Visit on 03/23/2021  Component Date Value Ref Range Status   SARS Coronavirus 2 03/23/2021 NEGATIVE  NEGATIVE Final   Comment: (NOTE) SARS-CoV-2 target nucleic acids are NOT DETECTED.  The SARS-CoV-2 RNA is generally detectable in upper and lower respiratory specimens during the acute phase of infection. Negative results do not preclude SARS-CoV-2 infection, do not rule out co-infections with other pathogens, and should not be used as the sole basis for treatment or other patient management decisions. Negative results must be combined with clinical observations, patient history, and epidemiological information. The expected result is Negative.  Fact Sheet for Patients: SugarRoll.be  Fact Sheet for Healthcare Providers: https://www.woods-mathews.com/  This test is not yet approved or cleared by the [place] FDA and  has been authorized for detection and/or diagnosis of SARS-CoV-2 by FDA under an Emergency Use Authorization (EUA). This EUA will remain  in effect (meaning this test can be used) for the duration of the COVID-19 declaration under Se                          ction 564(b)(1) of the Act, 21 U.S.C. section 360bbb-3(b)(1), unless the authorization is terminated or revoked sooner.  Performed at [facility], [place] [address] [place], [place] [address]   Hospital Outpatient Visit on 03/21/2021  Component Date Value Ref Range Status   WBC 03/21/2021 6.0  4.0 - 10.5 K/uL Final   RBC 03/21/2021 5.24 (A) 3.87 - 5.11 MIL/uL Final   Hemoglobin 03/21/2021 13.2  12.0 - 15.0 g/dL Final   HCT 03/21/2021 42.1  36.0 - 46.0 % Final   MCV 03/21/2021 80.3  80.0 - 100.0 fL Final   MCH 03/21/2021 25.2 (A) 26.0 - 34.0 pg Final   MCHC 03/21/2021 31.4  30.0 - 36.0 g/dL Final   RDW 03/21/2021 15.9 (A)  11.5 - 15.5 % Final   Platelets 03/21/2021 376  150 - 400 K/uL Final   nRBC 03/21/2021 0.0  0.0 - 0.2 % Final   Performed at [facility], [place] [address], [place], [place] [address]   Sodium 03/21/2021 136  135 - 145 mmol/L Final   Potassium 03/21/2021 3.2 (A) 3.5 - 5.1 mmol/L Final   Chloride 03/21/2021 101  98 - 111 mmol/L Final   CO2 03/21/2021 25  22 - 32 mmol/L Final   Glucose, Bld 03/21/2021 95  70 - 99 mg/dL Final   Glucose reference range applies only to samples taken after fasting for at least 8 hours.   BUN  03/21/2021 19  8 - 23 mg/dL Final   Creatinine, Ser 03/21/2021 0.65  0.44 - 1.00 mg/dL Final   Calcium 03/21/2021 9.5  8.9 - 10.3 mg/dL Final   Total Protein 03/21/2021 8.7 (A) 6.5 - 8.1 g/dL Final   Albumin 03/21/2021 4.1  3.5 - 5.0 g/dL Final   AST 03/21/2021 16  15 - 41 U/L Final   ALT 03/21/2021 14  0 - 44 U/L Final   Alkaline Phosphatase 03/21/2021 66  38 - 126 U/L Final   Total Bilirubin 03/21/2021 0.4  0.3 - 1.2 mg/dL Final   GFR, Estimated 03/21/2021 >60  >60 mL/min Final   Comment: (NOTE) Calculated using the CKD-EPI Creatinine Equation (2021)    Anion gap 03/21/2021 10  5 - 15 Final   Performed at [facility], [place] [address], [place], [place] [address]   Prothrombin Time 03/21/2021 12.8  11.4 - 15.2 seconds Final   INR 03/21/2021 1.0  0.8 - 1.2 Final   Comment: (NOTE) INR goal varies based on device and disease states. Performed at [facility], [place] [address] [place] [address] [place], [place] [address]    aPTT 03/21/2021 38 (A) 24 - 36 seconds Final   Comment:        IF BASELINE aPTT IS ELEVATED, SUGGEST PATIENT RISK ASSESSMENT BE USED TO DETERMINE APPROPRIATE ANTICOAGULANT THERAPY. Performed at [facility], [place] [address], [place], [place] [address]    ABO/RH(D) 03/21/2021 O POS   Final   Antibody Screen 03/21/2021 NEG   Final   Sample Expiration 03/21/2021 03/30/2021,2359   Final    Extend sample reason 03/21/2021    Final                   Value:NO TRANSFUSIONS OR PREGNANCY IN THE PAST 3 MONTHS Performed at [place] [address], [place], [place] [address]    Glucose-Capillary 03/21/2021 113 (A) 70 - 99 mg/dL Final   Glucose reference range applies only to samples taken after fasting for at least 8 hours.   MRSA, PCR 03/21/2021 NEGATIVE  NEGATIVE Final   Staphylococcus aureus 03/21/2021 POSITIVE (A) NEGATIVE Final   Comment: (NOTE) The Xpert SA Assay (FDA approved for NASAL specimens in patients 81 years of age and older), is one component of a comprehensive surveillance program. It is not intended to diagnose infection nor to guide or monitor treatment. Performed at [facility], [place] [address] [place], [place] [address]      X-Rays:No results found.  EKG: Orders placed or performed during the hospital encounter of 12/15/20   EKG 12 lead per protocol   EKG 12 lead per protocol     Hospital Course: Erin Wolf is a 65 y.o. who was admitted to [facility]. They were brought to the operating room on 03/27/2021 and underwent Procedure(s): TOTAL KNEE ARTHROPLASTY.  Patient tolerated the procedure well and was later transferred to the recovery room and then to the orthopaedic floor for postoperative care. They were given PO and IV analgesics for pain control following their surgery. They were given 24 hours of postoperative antibiotics of  Anti-infectives (From admission, onward)    Start     Dose/Rate Route Frequency Ordered Stop   03/27/21 1330  ceFAZolin (ANCEF) IVPB 2g/100 mL premix        2 g 200 mL/hr over 30 Minutes Intravenous Every 6 hours 03/27/21 1102 03/27/21 2022   03/27/21 0600  ceFAZolin (ANCEF)  IVPB 2g/100 mL premix        2 g 200 mL/hr over 30 Minutes Intravenous On call to O.R. 03/27/21 2683 03/27/21 0754      and started on DVT prophylaxis in the form of Xarelto.   PT and OT were  ordered for total joint protocol. Discharge planning consulted to help with postop disposition and equipment needs.  Patient had a good night on the evening of surgery. They started to get up OOB with therapy on POD #0 and ambulated 70 feet. Pt was seen during rounds and was ready to go home pending progress with therapy.She worked with therapy on POD #1 and was meeting her goals. Pt was discharged to home later that day in stable condition.  Diet: Regular diet Activity: WBAT Follow-up: in 2 weeks Disposition: Home Discharged Condition: good   Discharge Instructions     Call MD / Call 911   Complete by: As directed    If you experience chest pain or shortness of breath, CALL 911 and be transported to the hospital emergency room.  If you develope a fever above 101 F, pus (white drainage) or increased drainage or redness at the wound, or calf pain, call your surgeon's office.   Change dressing   Complete by: As directed    Maintain surgical dressing until follow up in the clinic. If the edges start to pull up, may reinforce with tape. If the dressing is no longer working, may remove and cover with gauze and tape, but must keep the area dry and clean.  Call with any questions or concerns.   Constipation Prevention   Complete by: As directed    Drink plenty of fluids.  Prune juice may be helpful.  You may use a stool softener, such as Colace (over the counter) 100 mg twice a day.  Use MiraLax (over the counter) for constipation as needed.   Diet - low sodium heart healthy   Complete by: As directed    Increase activity slowly as tolerated   Complete by: As directed    Weight bearing as tolerated with assist device (walker, cane, etc) as directed, use it as long as suggested by your surgeon or therapist, typically at least 4-6 weeks.   Post-operative opioid taper instructions:   Complete by: As directed    POST-OPERATIVE OPIOID TAPER INSTRUCTIONS: It is important to wean off of your opioid  medication as soon as possible. If you do not need pain medication after your surgery it is ok to stop day one. Opioids include: Codeine, Hydrocodone(Norco, Vicodin), Oxycodone(Percocet, oxycontin) and hydromorphone amongst others.  Long term and even short term use of opiods can cause: Increased pain response Dependence Constipation Depression Respiratory depression And more.  Withdrawal symptoms can include Flu like symptoms Nausea, vomiting And more Techniques to manage these symptoms Hydrate well Eat regular healthy meals Stay active Use relaxation techniques(deep breathing, meditating, yoga) Do Not substitute Alcohol to help with tapering If you have been on opioids for less than two weeks and do not have pain than it is ok to stop all together.  Plan to wean off of opioids This plan should start within one week post op of your joint replacement. Maintain the same interval or time between taking each dose and first decrease the dose.  Cut the total daily intake of opioids by one tablet each day Next start to increase the time between doses. The last dose that should be eliminated is the evening dose.  TED hose   Complete by: As directed    Use stockings (TED hose) for 2 weeks on both leg(s).  You may remove them at night for sleeping.      Allergies as of 03/28/2021       Reactions   Latex Other (See Comments)   Peels skin   Nsaids Other (See Comments)   HAS COLITIS FLARES   Wellbutrin [bupropion Hcl] Other (See Comments)   Insomnia and headaches   Aspirin Nausea Only   Darvocet [propoxyphene N-acetaminophen] Nausea And Vomiting   Tape Hives   Toradol [ketorolac Tromethamine] Nausea And Vomiting   Tylenol [acetaminophen] Nausea Only   Butalbital-apap-caffeine    stomach upset   Ciprofloxacin    stomach upset   Eszopiclone Swelling   Milk-related Compounds Diarrhea   Other    Spicy foods cause diarrhea due to colitis   Topiramate    stomach upset         Medication List     STOP taking these medications    ferrous sulfate 325 (65 FE) MG tablet Commonly known as: FerrouSul       TAKE these medications    cyclobenzaprine 10 MG tablet Commonly known as: FLEXERIL Take 10 mg by mouth as needed for muscle spasms.   docusate sodium 100 MG capsule Commonly known as: Colace Take 1 capsule (100 mg total) by mouth 2 (two) times daily.   esomeprazole 40 MG capsule Commonly known as: NEXIUM Take 40 mg by mouth daily as needed (acid reflux).   FLUoxetine 20 MG capsule Commonly known as: PROZAC Take 20 mg by mouth daily.   furosemide 20 MG tablet Commonly known as: LASIX Take 20 mg by mouth daily as needed for edema.   Narcan 4 MG/0.1ML Liqd nasal spray kit Generic drug: naloxone Place 1 spray into the nose daily as needed (opioid overdose).   Oxycodone HCl 10 MG Tabs Take 2 tablets (20 mg total) by mouth every 4 (four) hours as needed. What changed:  how much to take reasons to take this   polyethylene glycol 17 g packet Commonly known as: MIRALAX / GLYCOLAX Take 17 g by mouth 2 (two) times daily.   rivaroxaban 10 MG Tabs tablet Commonly known as: XARELTO Take 1 tablet (10 mg total) by mouth daily with breakfast for 20 days. What changed: when to take this   rosuvastatin 10 MG tablet Commonly known as: CRESTOR Take 10 mg by mouth every Monday.   SYSTANE OP Place 1 drop into both eyes 2 (two) times daily as needed (dry eyes).   THERAFLU FLU/COLD PO Take 1 packet by mouth daily as needed (cold symptoms).   triamterene-hydrochlorothiazide 75-50 MG tablet Commonly known as: MAXZIDE Take 1 tablet by mouth every morning.   WOMENS MULTI PO Take 1 tablet by mouth daily.               Discharge Care Instructions  (From admission, onward)           Start     Ordered   03/28/21 0000  Change dressing       Comments: Maintain surgical dressing until follow up in the clinic. If the edges start to pull  up, may reinforce with tape. If the dressing is no longer working, may remove and cover with gauze and tape, but must keep the area dry and clean.  Call with any questions or concerns.   03/28/21 1348  Follow-up Information     Paralee Cancel, MD. Go on 04/11/2021.   Specialty: Orthopedic Surgery Why: You are scheduled for first post op appointment on Wednesday June 22nd at 3:00pm. Contact information: [address] [place] [address] [phone]         Rosilyn Mings.. Go on 03/30/2021.   Why: You are scheduled for physical therapy eval on Friday June 10th at 11:30am.  Contact information: [place] [address] [phone]                 Signed: Griffith Citron, PA-C Orthopedic Surgery 04/03/2021, 1:37 PM

## 2021-04-06 DIAGNOSIS — M25661 Stiffness of right knee, not elsewhere classified: Secondary | ICD-10-CM | POA: Diagnosis not present

## 2021-04-06 DIAGNOSIS — M25561 Pain in right knee: Secondary | ICD-10-CM | POA: Diagnosis not present

## 2021-05-01 DIAGNOSIS — Z96651 Presence of right artificial knee joint: Secondary | ICD-10-CM | POA: Diagnosis not present

## 2021-05-01 DIAGNOSIS — Z96652 Presence of left artificial knee joint: Secondary | ICD-10-CM | POA: Diagnosis not present

## 2021-05-01 DIAGNOSIS — M25661 Stiffness of right knee, not elsewhere classified: Secondary | ICD-10-CM | POA: Diagnosis not present

## 2021-05-01 DIAGNOSIS — Z471 Aftercare following joint replacement surgery: Secondary | ICD-10-CM | POA: Diagnosis not present

## 2021-05-01 DIAGNOSIS — M25561 Pain in right knee: Secondary | ICD-10-CM | POA: Diagnosis not present

## 2021-05-09 DIAGNOSIS — E7800 Pure hypercholesterolemia, unspecified: Secondary | ICD-10-CM | POA: Diagnosis not present

## 2021-05-09 DIAGNOSIS — R600 Localized edema: Secondary | ICD-10-CM | POA: Diagnosis not present

## 2021-05-09 DIAGNOSIS — F324 Major depressive disorder, single episode, in partial remission: Secondary | ICD-10-CM | POA: Diagnosis not present

## 2021-05-09 DIAGNOSIS — G8929 Other chronic pain: Secondary | ICD-10-CM | POA: Diagnosis not present

## 2021-05-09 DIAGNOSIS — I10 Essential (primary) hypertension: Secondary | ICD-10-CM | POA: Diagnosis not present

## 2021-05-09 DIAGNOSIS — K5190 Ulcerative colitis, unspecified, without complications: Secondary | ICD-10-CM | POA: Diagnosis not present

## 2021-05-09 DIAGNOSIS — E1169 Type 2 diabetes mellitus with other specified complication: Secondary | ICD-10-CM | POA: Diagnosis not present

## 2021-05-15 DIAGNOSIS — M150 Primary generalized (osteo)arthritis: Secondary | ICD-10-CM | POA: Diagnosis not present

## 2021-05-15 DIAGNOSIS — G8929 Other chronic pain: Secondary | ICD-10-CM | POA: Diagnosis not present

## 2021-05-15 DIAGNOSIS — F324 Major depressive disorder, single episode, in partial remission: Secondary | ICD-10-CM | POA: Diagnosis not present

## 2021-05-15 DIAGNOSIS — I10 Essential (primary) hypertension: Secondary | ICD-10-CM | POA: Diagnosis not present

## 2021-05-15 DIAGNOSIS — G43001 Migraine without aura, not intractable, with status migrainosus: Secondary | ICD-10-CM | POA: Diagnosis not present

## 2021-05-15 DIAGNOSIS — E7800 Pure hypercholesterolemia, unspecified: Secondary | ICD-10-CM | POA: Diagnosis not present

## 2021-05-15 DIAGNOSIS — E1169 Type 2 diabetes mellitus with other specified complication: Secondary | ICD-10-CM | POA: Diagnosis not present

## 2021-06-07 DIAGNOSIS — M25561 Pain in right knee: Secondary | ICD-10-CM | POA: Diagnosis not present

## 2021-06-07 DIAGNOSIS — M25661 Stiffness of right knee, not elsewhere classified: Secondary | ICD-10-CM | POA: Diagnosis not present

## 2021-06-12 DIAGNOSIS — M25561 Pain in right knee: Secondary | ICD-10-CM | POA: Diagnosis not present

## 2021-06-12 DIAGNOSIS — M25661 Stiffness of right knee, not elsewhere classified: Secondary | ICD-10-CM | POA: Diagnosis not present

## 2021-06-15 DIAGNOSIS — M25561 Pain in right knee: Secondary | ICD-10-CM | POA: Diagnosis not present

## 2021-06-15 DIAGNOSIS — M25661 Stiffness of right knee, not elsewhere classified: Secondary | ICD-10-CM | POA: Diagnosis not present

## 2021-06-20 DIAGNOSIS — E7800 Pure hypercholesterolemia, unspecified: Secondary | ICD-10-CM | POA: Diagnosis not present

## 2021-06-20 DIAGNOSIS — H269 Unspecified cataract: Secondary | ICD-10-CM | POA: Diagnosis not present

## 2021-06-20 DIAGNOSIS — I10 Essential (primary) hypertension: Secondary | ICD-10-CM | POA: Diagnosis not present

## 2021-06-20 DIAGNOSIS — F324 Major depressive disorder, single episode, in partial remission: Secondary | ICD-10-CM | POA: Diagnosis not present

## 2021-06-20 DIAGNOSIS — G8929 Other chronic pain: Secondary | ICD-10-CM | POA: Diagnosis not present

## 2021-06-20 DIAGNOSIS — M150 Primary generalized (osteo)arthritis: Secondary | ICD-10-CM | POA: Diagnosis not present

## 2021-06-20 DIAGNOSIS — G43001 Migraine without aura, not intractable, with status migrainosus: Secondary | ICD-10-CM | POA: Diagnosis not present

## 2021-06-20 DIAGNOSIS — E1169 Type 2 diabetes mellitus with other specified complication: Secondary | ICD-10-CM | POA: Diagnosis not present

## 2021-06-21 DIAGNOSIS — M25661 Stiffness of right knee, not elsewhere classified: Secondary | ICD-10-CM | POA: Diagnosis not present

## 2021-06-21 DIAGNOSIS — M25561 Pain in right knee: Secondary | ICD-10-CM | POA: Diagnosis not present

## 2021-07-10 DIAGNOSIS — G8929 Other chronic pain: Secondary | ICD-10-CM | POA: Diagnosis not present

## 2021-07-10 DIAGNOSIS — E7800 Pure hypercholesterolemia, unspecified: Secondary | ICD-10-CM | POA: Diagnosis not present

## 2021-07-10 DIAGNOSIS — M170 Bilateral primary osteoarthritis of knee: Secondary | ICD-10-CM | POA: Diagnosis not present

## 2021-07-10 DIAGNOSIS — E1169 Type 2 diabetes mellitus with other specified complication: Secondary | ICD-10-CM | POA: Diagnosis not present

## 2021-07-10 DIAGNOSIS — F324 Major depressive disorder, single episode, in partial remission: Secondary | ICD-10-CM | POA: Diagnosis not present

## 2021-07-10 DIAGNOSIS — M150 Primary generalized (osteo)arthritis: Secondary | ICD-10-CM | POA: Diagnosis not present

## 2021-07-10 DIAGNOSIS — I10 Essential (primary) hypertension: Secondary | ICD-10-CM | POA: Diagnosis not present

## 2021-07-10 DIAGNOSIS — G43001 Migraine without aura, not intractable, with status migrainosus: Secondary | ICD-10-CM | POA: Diagnosis not present

## 2021-08-01 DIAGNOSIS — Z01419 Encounter for gynecological examination (general) (routine) without abnormal findings: Secondary | ICD-10-CM | POA: Diagnosis not present

## 2021-08-01 DIAGNOSIS — Z1231 Encounter for screening mammogram for malignant neoplasm of breast: Secondary | ICD-10-CM | POA: Diagnosis not present

## 2021-08-01 DIAGNOSIS — Z6837 Body mass index (BMI) 37.0-37.9, adult: Secondary | ICD-10-CM | POA: Diagnosis not present

## 2021-08-07 DIAGNOSIS — G43001 Migraine without aura, not intractable, with status migrainosus: Secondary | ICD-10-CM | POA: Diagnosis not present

## 2021-08-07 DIAGNOSIS — F324 Major depressive disorder, single episode, in partial remission: Secondary | ICD-10-CM | POA: Diagnosis not present

## 2021-08-07 DIAGNOSIS — I10 Essential (primary) hypertension: Secondary | ICD-10-CM | POA: Diagnosis not present

## 2021-08-07 DIAGNOSIS — G8929 Other chronic pain: Secondary | ICD-10-CM | POA: Diagnosis not present

## 2021-08-07 DIAGNOSIS — M150 Primary generalized (osteo)arthritis: Secondary | ICD-10-CM | POA: Diagnosis not present

## 2021-08-07 DIAGNOSIS — E7800 Pure hypercholesterolemia, unspecified: Secondary | ICD-10-CM | POA: Diagnosis not present

## 2021-08-07 DIAGNOSIS — E1169 Type 2 diabetes mellitus with other specified complication: Secondary | ICD-10-CM | POA: Diagnosis not present

## 2021-08-10 ENCOUNTER — Emergency Department (HOSPITAL_BASED_OUTPATIENT_CLINIC_OR_DEPARTMENT_OTHER): Payer: Medicare HMO

## 2021-08-10 ENCOUNTER — Emergency Department (HOSPITAL_BASED_OUTPATIENT_CLINIC_OR_DEPARTMENT_OTHER)
Admission: EM | Admit: 2021-08-10 | Discharge: 2021-08-10 | Disposition: A | Discharge status: Home / Self Care | Payer: Medicare HMO | Attending: Emergency Medicine | Admitting: Emergency Medicine

## 2021-08-10 ENCOUNTER — Other Ambulatory Visit: Payer: Self-pay

## 2021-08-10 ENCOUNTER — Encounter (HOSPITAL_BASED_OUTPATIENT_CLINIC_OR_DEPARTMENT_OTHER): Payer: Self-pay

## 2021-08-10 DIAGNOSIS — R55 Syncope and collapse: Secondary | ICD-10-CM | POA: Diagnosis not present

## 2021-08-10 DIAGNOSIS — M25561 Pain in right knee: Secondary | ICD-10-CM | POA: Insufficient documentation

## 2021-08-10 DIAGNOSIS — W010XXA Fall on same level from slipping, tripping and stumbling without subsequent striking against object, initial encounter: Secondary | ICD-10-CM | POA: Diagnosis not present

## 2021-08-10 DIAGNOSIS — M25512 Pain in left shoulder: Secondary | ICD-10-CM | POA: Diagnosis not present

## 2021-08-10 DIAGNOSIS — H53149 Visual discomfort, unspecified: Secondary | ICD-10-CM | POA: Diagnosis not present

## 2021-08-10 DIAGNOSIS — I10 Essential (primary) hypertension: Secondary | ICD-10-CM | POA: Diagnosis not present

## 2021-08-10 DIAGNOSIS — Z91040 Latex allergy status: Secondary | ICD-10-CM | POA: Diagnosis not present

## 2021-08-10 DIAGNOSIS — E119 Type 2 diabetes mellitus without complications: Secondary | ICD-10-CM | POA: Insufficient documentation

## 2021-08-10 DIAGNOSIS — M25562 Pain in left knee: Secondary | ICD-10-CM | POA: Diagnosis not present

## 2021-08-10 DIAGNOSIS — Z96653 Presence of artificial knee joint, bilateral: Secondary | ICD-10-CM | POA: Insufficient documentation

## 2021-08-10 DIAGNOSIS — S0083XA Contusion of other part of head, initial encounter: Secondary | ICD-10-CM | POA: Insufficient documentation

## 2021-08-10 DIAGNOSIS — R519 Headache, unspecified: Secondary | ICD-10-CM | POA: Diagnosis not present

## 2021-08-10 DIAGNOSIS — M47812 Spondylosis without myelopathy or radiculopathy, cervical region: Secondary | ICD-10-CM | POA: Diagnosis not present

## 2021-08-10 DIAGNOSIS — Z96642 Presence of left artificial hip joint: Secondary | ICD-10-CM | POA: Diagnosis not present

## 2021-08-10 DIAGNOSIS — S0990XA Unspecified injury of head, initial encounter: Secondary | ICD-10-CM | POA: Insufficient documentation

## 2021-08-10 LAB — CBC WITH DIFFERENTIAL/PLATELET
Abs Immature Granulocytes: 0 10*3/uL (ref 0.00–0.07)
Basophils Absolute: 0 10*3/uL (ref 0.0–0.1)
Basophils Relative: 0 %
Eosinophils Absolute: 0 10*3/uL (ref 0.0–0.5)
Eosinophils Relative: 0 %
HCT: 38.9 % (ref 36.0–46.0)
Hemoglobin: 12.3 g/dL (ref 12.0–15.0)
Immature Granulocytes: 0 %
Lymphocytes Relative: 63 %
Lymphs Abs: 3.5 10*3/uL (ref 0.7–4.0)
MCH: 25.4 pg — ABNORMAL LOW (ref 26.0–34.0)
MCHC: 31.6 g/dL (ref 30.0–36.0)
MCV: 80.2 fL (ref 80.0–100.0)
Monocytes Absolute: 0.5 10*3/uL (ref 0.1–1.0)
Monocytes Relative: 9 %
Neutro Abs: 1.6 10*3/uL — ABNORMAL LOW (ref 1.7–7.7)
Neutrophils Relative %: 28 %
Platelets: 303 10*3/uL (ref 150–400)
RBC: 4.85 MIL/uL (ref 3.87–5.11)
RDW: 16.5 % — ABNORMAL HIGH (ref 11.5–15.5)
WBC: 5.6 10*3/uL (ref 4.0–10.5)
nRBC: 0 % (ref 0.0–0.2)

## 2021-08-10 LAB — BASIC METABOLIC PANEL
Anion gap: 9 (ref 5–15)
BUN: 12 mg/dL (ref 8–23)
CO2: 24 mmol/L (ref 22–32)
Calcium: 9 mg/dL (ref 8.9–10.3)
Chloride: 102 mmol/L (ref 98–111)
Creatinine, Ser: 0.64 mg/dL (ref 0.44–1.00)
GFR, Estimated: >60 mL/min (ref >60)
Glucose, Bld: 92 mg/dL (ref 70–99)
Potassium: 3.6 mmol/L (ref 3.5–5.1)
Sodium: 135 mmol/L (ref 135–145)

## 2021-08-10 MED ORDER — OXYCODONE-ACETAMINOPHEN 5-325 MG PO TABS
2.0000 | ORAL_TABLET | Freq: Once | ORAL | Status: DC
Start: 2021-08-10 — End: 2021-08-10
  Filled 2021-08-10: qty 2

## 2021-08-10 MED ORDER — HYDROMORPHONE HCL 1 MG/ML IJ SOLN
2.0000 mg | Freq: Once | INTRAMUSCULAR | Status: AC
Start: 2021-08-10 — End: 2021-08-10
  Administered 2021-08-10: 2 mg via INTRAMUSCULAR
  Filled 2021-08-10: qty 2

## 2021-08-10 MED ORDER — MORPHINE SULFATE (PF) 4 MG/ML IV SOLN
4.0000 mg | Freq: Once | INTRAVENOUS | Status: AC
  Administered 2021-08-10: 4 mg via INTRAMUSCULAR
  Filled 2021-08-10: qty 1

## 2021-08-10 NOTE — Discharge Instructions (Signed)
You were seen in the emergency department for evaluation as of injuries from a fainting spell yesterday at home.  You had a CAT scan of your head neck and face along with x-rays of your left shoulder and both knees that did not show any acute traumatic findings.  You should use ice to the affected area and continue your regular pain medication.  Follow-up with your primary care doctor and orthopedist.  Return to the emergency department if any worsening or concerning symptoms

## 2021-08-10 NOTE — ED Notes (Signed)
First contact with patient. Patient arrived via triage from home with complaints of left sided face pain/body pain and headache from a fall yesterday. Pt is A&OX 4. Respirations even/unlabored. Patient changed into gown and placed on monitor and call light within reach. Patient updated on plan of care. Will continue to monitor patient.

## 2021-08-10 NOTE — ED Triage Notes (Addendum)
Pt states she passed out in her BR yesterday-c/o pain to left side of face,upper lip and HA started this am-pt states she thinks she struck face on the floor-pain unrelieved by tylenol-pt crying-to triage in w/c

## 2021-08-10 NOTE — ED Provider Notes (Signed)
[facility] EMERGENCY DEPARTMENT Provider Note   CSN: [id number] Arrival date & time: 08/10/21  1613     History Chief Complaint  Patient presents with   Loss of Consciousness    Erin Wolf is a 65 y.o. female.  She is here for evaluation of injuries from a syncopal event last evening.  She said she was in the bathroom when she passed out.  She fell striking her left head and face on the ground.  She is not clear how long she was out for but her husband heard her her and was able to help her back to the bedroom.  She is complaining of intractable head face and left shoulder pain.  Also has pain in both of her knees.  Has taken Tylenol and oxycodone without improvement.  Photophobia.  Inner lip bleeding.  No malocclusion or loose teeth.  No chest pain abdominal pain shortness of breath fevers chills.  The history is provided by the patient.  Loss of Consciousness Episode history:  Single Most recent episode:  Yesterday Timing:  Unable to specify Progression:  Resolved Chronicity:  New Context: normal activity   Witnessed: no   Relieved by:  Nothing Worsened by:  Nothing Ineffective treatments:  Bed rest Associated symptoms: headaches   Associated symptoms: no chest pain, no difficulty breathing, no fever, no focal sensory loss, no focal weakness, no nausea, no shortness of breath, no visual change and no vomiting       Past Medical History:  Diagnosis Date   Anxiety    Arthritis    Chronic abdominal pain    Chronic lower back pain    Chronic nausea    Depression    Diabetes mellitus without complication (HCC)    Diverticulosis    Fibromyalgia    Frequency of urination    GERD (gastroesophageal reflux disease)    Headache    Hematuria    Hepatitis    Hepatitis C antibody positive in blood    per pt told by pcp   History of panic attacks    History of syncope    hx recurrent syncope --- per epic domentation non-cardiac , orthostatic hypotension,  anxiety, dehydration   History of uterine fibroid    HTN (hypertension), benign    Hyperlipidemia 01/03/2012   IBS (irritable bowel syndrome)    Myalgia    Seizure disorder ([place]) followed by pcp until new neurologist appr. in jan 2019   "started having them in my 20's" ,  petit mal ----  per pt last seizure one 2015   SLE (systemic lupus erythematosus) [facility])    rheumatologist-- dr Gerilyn Nestle (consult 11-12-2016) having work-up done   Ulcerative colitis    followed by dr Penelope Coop at [place]   Wears glasses     Patient Active Problem List   Diagnosis Date Noted   S/P total knee arthroplasty, right 03/27/2021   Osteoarthritis of left knee 12/19/2020   Status post total left knee replacement 12/19/2020   Chronic abdominal pain 05/05/2020   Chronic pain of left knee    Chronic migraine 11/10/2018   Nausea & vomiting 10/18/2016   History of ulcerative colitis    LLQ abdominal pain 10/17/2016   Syncope and collapse 06/29/2016   Left-sided weakness 06/29/2016   Facial twitching 06/29/2016   IBS (irritable bowel syndrome) 06/29/2016   Faintness    Dehydration 05/09/2014   GERD (gastroesophageal reflux disease) 05/09/2014   Gastroenteritis 05/09/2014   Chest pain, atypical 05/09/2014  Chronic ulcerative colitis ([place]) 05/09/2014   Acute gastroenteritis 05/09/2014   Ulcerative colitis ([place]) 05/27/2013   Hypokalemia 05/27/2013   Anemia 01/04/2012   Transaminitis 01/04/2012   Diarrhea 01/04/2012   Chest pain 01/03/2012   SOB (shortness of breath) 01/03/2012   HTN (hypertension), benign 01/03/2012   Hx of migraines 01/03/2012   Seizure ([place]) 01/03/2012   Depression 01/03/2012   Ulcerative colitis 01/03/2012   Chronic back pain 01/03/2012   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   Hyperlipidemia 01/03/2012   Hepatitis C 01/03/2012   Fibroids 01/03/2012    Past Surgical History:  Procedure Laterality Date   [place]   COLONOSCOPY N/A  05/31/2013   Procedure: COLONOSCOPY;  Surgeon: Wonda Horner, MD;  Location: [facility] ENDOSCOPY;  Service: Endoscopy;  Laterality: N/A;   CYSTOSCOPY/RETROGRADE/URETEROSCOPY Bilateral 07/23/2017   Procedure: CYSTOSCOPY/RETROGRADE/URETEROSCOPY;  Surgeon: Ceasar Mons, MD;  Location: [facility];  Service: Urology;  Laterality: Bilateral;   ESOPHAGOGASTRODUODENOSCOPY (EGD) WITH PROPOFOL N/A 05/11/2014   Procedure: ESOPHAGOGASTRODUODENOSCOPY (EGD) WITH PROPOFOL;  Surgeon: Lear Ng, MD;  Location: [facility] ENDOSCOPY;  Service: Endoscopy;  Laterality: N/A;  egd first   ESOPHAGOGASTRODUODENOSCOPY (EGD) WITH PROPOFOL N/A 05/08/2020   Procedure: ESOPHAGOGASTRODUODENOSCOPY (EGD) WITH PROPOFOL;  Surgeon: Arta Silence, MD;  Location: [facility] ENDOSCOPY;  Service: Endoscopy;  Laterality: N/A;   EUS N/A 06/21/2016   Procedure: ESOPHAGEAL ENDOSCOPIC ULTRASOUND (EUS) RADIAL;  Surgeon: Arta Silence, MD;  Location: [facility] ENDOSCOPY;  Service: Endoscopy;  Laterality: N/A;   FLEXIBLE SIGMOIDOSCOPY N/A 05/11/2014   Procedure: FLEXIBLE SIGMOIDOSCOPY;  Surgeon: Lear Ng, MD;  Location: [facility] ENDOSCOPY;  Service: Endoscopy;  Laterality: N/A;   LAPAROTOMY W/ BILATERAL SALPINGOOPHORECTOMY  09-26-2003   dr Matthew Saras at [place]  06-08-2003   dr Radene Knee [place] Left 12/19/2020   Procedure: TOTAL KNEE ARTHROPLASTY;  Surgeon: Paralee Cancel, MD;  Location: [facility] ORS;  Service: Orthopedics;  Laterality: Left;  70 mins   TOTAL KNEE ARTHROPLASTY Right 03/27/2021   Procedure: TOTAL KNEE ARTHROPLASTY;  Surgeon: Paralee Cancel, MD;  Location: [facility] ORS;  Service: Orthopedics;  Laterality: Right;  70 mins   TRANSTHORACIC ECHOCARDIOGRAM  04/29/2016   ef 60-65%,  grade 2 diastolic dysfunction/  trivial MR and TR   TUBAL LIGATION Bilateral yrs ago   UPPER ESOPHAGEAL ENDOSCOPIC ULTRASOUND (EUS) N/A 05/08/2020   Procedure: UPPER ESOPHAGEAL ENDOSCOPIC ULTRASOUND (EUS);  Surgeon:  Arta Silence, MD;  Location: [facility];  Service: Endoscopy;  Laterality: N/A;     OB History   No obstetric history on file.     Family History  Problem Relation Age of Onset   Alzheimer's disease Mother    Hypertension Mother    Colon cancer Brother    Hypertension Brother    Other Father        ruptured appendix   Breast cancer Sister    Hypertension Sister    Hypertension Sister    Memory loss Brother    Hypertension Sister    Heart disease Sister     Social History   Tobacco Use   Smoking status: Never   Smokeless tobacco: Never  Vaping Use   Vaping Use: Never used  Substance Use Topics   Alcohol use: Yes    Comment: occ   Drug use: No    Home Medications Prior to Admission medications   Medication Sig Start Date End Date Taking? Authorizing Provider  Chlorphen-Pseudoephed-APAP [facility]  FLU/COLD PO) Take 1 packet by mouth daily as needed (cold symptoms).    [provider]  cyclobenzaprine (FLEXERIL) 10 MG tablet Take 10 mg by mouth as needed for muscle spasms. 03/30/20   [provider]  docusate sodium (COLACE) 100 MG capsule Take 1 capsule (100 mg total) by mouth 2 (two) times daily. Patient not taking: No sig reported 12/20/20   Danae Orleans, PA-C  esomeprazole (NEXIUM) 40 MG capsule Take 40 mg by mouth daily as needed (acid reflux). 06/10/19   [provider]  FLUoxetine (PROZAC) 20 MG capsule Take 20 mg by mouth daily.     [provider]  furosemide (LASIX) 20 MG tablet Take 20 mg by mouth daily as needed for edema.    [provider]  Multiple Vitamins-Minerals (WOMENS MULTI PO) Take 1 tablet by mouth daily.    [provider]  naloxone [facility]) nasal spray 4 mg/0.1 mL Place 1 spray into the nose daily as needed (opioid overdose).    [provider]  Oxycodone HCl 10 MG TABS Take 2 tablets (20 mg total) by mouth every 4 (four) hours as needed. 03/28/21   Irving Copas, PA-C  Polyethyl  Glycol-Propyl Glycol (SYSTANE OP) Place 1 drop into both eyes 2 (two) times daily as needed (dry eyes).    [provider]  polyethylene glycol (MIRALAX / GLYCOLAX) 17 g packet Take 17 g by mouth 2 (two) times daily. Patient not taking: No sig reported 12/20/20   Danae Orleans, PA-C  rivaroxaban (XARELTO) 10 MG TABS tablet Take 1 tablet (10 mg total) by mouth daily with breakfast for 20 days. 03/29/21 04/18/21  Irving Copas, PA-C  rosuvastatin (CRESTOR) 10 MG tablet Take 10 mg by mouth every Monday.    [provider]  triamterene-hydrochlorothiazide (MAXZIDE) 75-50 MG tablet Take 1 tablet by mouth every morning. 03/30/20   [provider]    Allergies    Latex, Nsaids, Wellbutrin [bupropion hcl], Aspirin, Darvocet [propoxyphene n-acetaminophen], Tape, Toradol [ketorolac tromethamine], Tylenol [acetaminophen], Butalbital-apap-caffeine, Ciprofloxacin, Eszopiclone, Milk-related compounds, Other, and Topiramate  Review of Systems   Review of Systems  Constitutional:  Negative for fever.  HENT:  Negative for sore throat.   Eyes:  Positive for photophobia. Negative for visual disturbance.  Respiratory:  Negative for shortness of breath.   Cardiovascular:  Positive for syncope. Negative for chest pain.  Gastrointestinal:  Negative for abdominal pain, nausea and vomiting.  Genitourinary:  Negative for dysuria.  Musculoskeletal:  Negative for neck pain.  Skin:  Negative for rash.  Neurological:  Positive for headaches. Negative for focal weakness.   Physical Exam Updated Vital Signs BP (!) 147/93 (BP Location: Right Arm)   Pulse 90   Temp 98.4 F (36.9 C)   Resp 20   Ht 5\' 5"  (1.651 m)   Wt 97.5 kg   SpO2 100%   BMI 35.78 kg/m   Physical Exam Vitals and nursing note reviewed.  Constitutional:      General: She is not in acute distress.    Appearance: Normal appearance. She is well-developed.  HENT:     Head: Normocephalic and atraumatic.     Right Ear:  Tympanic membrane normal.     Left Ear: Tympanic membrane normal.     Nose: Nose normal.     Mouth/Throat:     Mouth: Mucous membranes are moist.     Pharynx: Oropharynx is clear.  Eyes:     Conjunctiva/sclera: Conjunctivae normal.  Cardiovascular:  Rate and Rhythm: Normal rate and regular rhythm.     Heart sounds: No murmur heard. Pulmonary:     Effort: Pulmonary effort is normal. No respiratory distress.     Breath sounds: Normal breath sounds.  Abdominal:     Palpations: Abdomen is soft.     Tenderness: There is no abdominal tenderness. There is no guarding or rebound.  Musculoskeletal:        General: Tenderness present. No deformity or signs of injury. Normal range of motion.     Cervical back: Neck supple.     Comments: She is some diffuse tenderness of her left shoulder and both of her knees.  Well-healed surgical scars over her knees.  Distal neurovascular intact.  Skin:    General: Skin is warm and dry.  Neurological:     General: No focal deficit present.     Mental Status: She is alert and oriented to person, place, and time.     Cranial Nerves: No cranial nerve deficit.     Sensory: No sensory deficit.     Motor: No weakness.    ED Results / Procedures / Treatments   Labs (all labs ordered are listed, but only abnormal results are displayed) Labs Reviewed  CBC WITH DIFFERENTIAL/PLATELET - Abnormal; Notable for the following components:      Result Value   MCH 25.4 (*)    RDW 16.5 (*)    Neutro Abs 1.6 (*)    All other components within normal limits  BASIC METABOLIC PANEL    EKG EKG Interpretation  Date/Time:  Friday August 10 2021 16:26:52 EDT Ventricular Rate:  86 PR Interval:  176 QRS Duration: 78 QT Interval:  386 QTC Calculation: 461 R Axis:   52 Text Interpretation: Normal sinus rhythm Normal ECG No significant change since prior 2/22 Confirmed by Aletta Edouard [phone]) on 08/10/2021 4:27:55 PM  Radiology DG Knee 2 Views Left  Result  Date: 08/10/2021 CLINICAL DATA:  Fall at home last night, pain. EXAM: LEFT KNEE - 1-2 VIEW COMPARISON:  None. FINDINGS: Left knee arthroplasty in expected alignment. No periprosthetic lucency or fracture. There has been patellar resurfacing. Mild heterotopic calcification in the region of the distal quadriceps tendon. No joint effusion. IMPRESSION: 1. No fracture or subluxation of the left knee. 2. Left knee arthroplasty without complication. Electronically Signed   By: Keith Rake M.D.   On: 08/10/2021 17:57   DG Knee 2 Views Right  Result Date: 08/10/2021 CLINICAL DATA:  Fall at home last night, bilateral knee pain. EXAM: RIGHT KNEE - 1-2 VIEW COMPARISON:  None. FINDINGS: Right knee arthroplasty in expected alignment. No periprosthetic fracture or lucency. There has been patellar resurfacing. There may be a diminutive joint effusion. Tiny quadriceps tendon enthesophyte. IMPRESSION: 1. Right knee arthroplasty without complication or acute fracture. 2. Possible diminutive joint effusion. Electronically Signed   By: Keith Rake M.D.   On: 08/10/2021 17:57   CT Head Wo Contrast  Result Date: 08/10/2021 CLINICAL DATA:  Syncope yesterday, left-sided facial pain, headache EXAM: CT HEAD WITHOUT CONTRAST TECHNIQUE: Contiguous axial images were obtained from the base of the skull through the vertex without intravenous contrast. COMPARISON:  03/06/2018 FINDINGS: Brain: No acute infarct or hemorrhage. Lateral ventricles and midline structures appear unremarkable. No acute extra-axial fluid collections. No mass effect. Vascular: No hyperdense vessel or unexpected calcification. Skull: Normal. Negative for fracture or focal lesion. Sinuses/Orbits: No acute finding. Other: None. IMPRESSION: 1. No acute intracranial process. Electronically Signed   By:  Randa Ngo M.D.   On: 08/10/2021 17:08   CT Cervical Spine Wo Contrast  Result Date: 08/10/2021 CLINICAL DATA:  Syncope yesterday, left-sided facial  pain EXAM: CT CERVICAL SPINE WITHOUT CONTRAST TECHNIQUE: Multidetector CT imaging of the cervical spine was performed without intravenous contrast. Multiplanar CT image reconstructions were also generated. COMPARISON:  11/03/2011 FINDINGS: Alignment: Straightening of the cervical spine due to multilevel spondylosis unchanged. Otherwise alignment is anatomic. Skull base and vertebrae: No acute fracture. No primary bone lesion or focal pathologic process. Soft tissues and spinal canal: No prevertebral fluid or swelling. No visible canal hematoma. Disc levels: Extensive multilevel spondylosis most pronounced from C4 through C7. Mild progression since prior study. Upper chest: Central airway is patent.  Lung apices are clear. Other: Reconstructed images demonstrate no additional findings. IMPRESSION: 1. Progressive multilevel cervical spondylosis.  No acute fracture. Electronically Signed   By: Randa Ngo M.D.   On: 08/10/2021 17:11   DG Shoulder Left  Result Date: 08/10/2021 CLINICAL DATA:  Left shoulder pain after fall.  Fall at night. EXAM: LEFT SHOULDER - 2+ VIEW COMPARISON:  None. FINDINGS: There is no evidence of fracture or dislocation. Prominent glenohumeral osteoarthritis with near complete joint space loss, osteophytes including the large inferior osteophyte arising from the humeral head. There is moderate acromioclavicular degenerative change. Small subacromial spur. Rounded soft tissue calcification subjacent to the acromion. Soft tissues are unremarkable. IMPRESSION: 1. No fracture or subluxation of the left shoulder. 2. Advanced glenohumeral and moderate acromioclavicular degenerative change. 3. Rounded soft tissue calcification subjacent to the acromion may represent calcific tendinopathy or an ossified intra-articular body. Electronically Signed   By: Keith Rake M.D.   On: 08/10/2021 17:56   CT Maxillofacial WO CM  Result Date: 08/10/2021 CLINICAL DATA:  Syncope, left-sided facial  pain, trauma EXAM: CT MAXILLOFACIAL WITHOUT CONTRAST TECHNIQUE: Multidetector CT imaging of the maxillofacial structures was performed. Multiplanar CT image reconstructions were also generated. COMPARISON:  None. FINDINGS: Osseous: No fracture or mandibular dislocation. No destructive process. Orbits: Negative. No traumatic or inflammatory finding. Sinuses: Clear. Soft tissues: Mild left infraorbital soft tissue swelling. Remaining soft tissues are unremarkable. Limited intracranial: No significant or unexpected finding. IMPRESSION: 1. Mild left infraorbital soft tissue swelling. No acute displaced fractures. Electronically Signed   By: Randa Ngo M.D.   On: 08/10/2021 17:12    Procedures Procedures   Medications Ordered in ED Medications  morphine 4 MG/ML injection 4 mg (4 mg Intramuscular Given 08/10/21 1732)  HYDROmorphone (DILAUDID) injection 2 mg (2 mg Intramuscular Given 08/10/21 1829)    ED Course  I have reviewed the triage vital signs and the nursing notes.  Pertinent labs & imaging results that were available during my care of the patient were reviewed by me and considered in my medical decision making (see chart for details).  Clinical Course as of 08/11/21 1059  Fri Aug 10, 2021  1731 X-rays of patient's left shoulder and bilateral knees do not show any acute fracture or dislocation.  Awaiting radiology reading. [MB]  7902 Reviewed results of work-up with patient.  She had declined the oral pain medicine and said the IM morphine did not do anything.  She is asking for another shot of something to help her with her pain. [MB]  [id number] Patient states she feels much better after the second dose of pain medicine she is ready for discharge.  She has a ride waiting for her. [MB]    Clinical Course User Index [MB] Hayden Rasmussen,  MD   MDM Rules/Calculators/A&P                          This patient complains of left-sided facial pain head pain neck pain left shoulder pain  bilateral knee pain after syncopal event at home yesterday; this involves an extensive number of treatment Options and is a complaint that carries with it a high risk of complications and Morbidity. The differential includes contusion, skull fracture, facial fractures, intracranial bleed, dislocation, fracture  I ordered, reviewed and interpreted labs, which included CBC with normal white count normal hemoglobin, chemistries normal I ordered medication IM pain medication with improvement in her symptoms I ordered imaging studies which included CT head neck max face along with x-rays of left shoulder bilateral knees and I independently    visualized and interpreted imaging which showed no acute fracture or dislocation, no bleed  Previous records obtained and reviewed in epic no recent admissions   After the interventions stated above, I reevaluated the patient and found patient to be neuro intact and pain is improved.  Vitals otherwise unremarkable.  Recommended close follow-up with her treating providers.  Return instructions discussed   Final Clinical Impression(s) / ED Diagnoses Final diagnoses:  Syncope and collapse  Injury of head in adult  Contusion of face, initial encounter  Acute pain of left shoulder  Acute pain of both knees    Rx / DC Orders ED Discharge Orders     None        Hayden Rasmussen, MD 08/11/21 1101

## 2021-08-10 NOTE — ED Notes (Signed)
No acute distress noted upon this RN's departure of patient. Verified discharge paperwork with name and DOB. Vital signs stable. Patient taken to checkout window. Discharge paperwork discussed with patient. No further questions voiced upon discharge.

## 2021-09-07 DIAGNOSIS — F324 Major depressive disorder, single episode, in partial remission: Secondary | ICD-10-CM | POA: Diagnosis not present

## 2021-09-07 DIAGNOSIS — E1169 Type 2 diabetes mellitus with other specified complication: Secondary | ICD-10-CM | POA: Diagnosis not present

## 2021-09-07 DIAGNOSIS — G43001 Migraine without aura, not intractable, with status migrainosus: Secondary | ICD-10-CM | POA: Diagnosis not present

## 2021-09-07 DIAGNOSIS — I10 Essential (primary) hypertension: Secondary | ICD-10-CM | POA: Diagnosis not present

## 2021-09-07 DIAGNOSIS — M150 Primary generalized (osteo)arthritis: Secondary | ICD-10-CM | POA: Diagnosis not present

## 2021-09-07 DIAGNOSIS — E7800 Pure hypercholesterolemia, unspecified: Secondary | ICD-10-CM | POA: Diagnosis not present

## 2021-09-07 DIAGNOSIS — G8929 Other chronic pain: Secondary | ICD-10-CM | POA: Diagnosis not present

## 2021-09-07 DIAGNOSIS — M170 Bilateral primary osteoarthritis of knee: Secondary | ICD-10-CM | POA: Diagnosis not present

## 2021-10-05 DIAGNOSIS — M170 Bilateral primary osteoarthritis of knee: Secondary | ICD-10-CM | POA: Diagnosis not present

## 2021-10-05 DIAGNOSIS — F324 Major depressive disorder, single episode, in partial remission: Secondary | ICD-10-CM | POA: Diagnosis not present

## 2021-10-05 DIAGNOSIS — M150 Primary generalized (osteo)arthritis: Secondary | ICD-10-CM | POA: Diagnosis not present

## 2021-10-05 DIAGNOSIS — E7800 Pure hypercholesterolemia, unspecified: Secondary | ICD-10-CM | POA: Diagnosis not present

## 2021-10-05 DIAGNOSIS — G43001 Migraine without aura, not intractable, with status migrainosus: Secondary | ICD-10-CM | POA: Diagnosis not present

## 2021-10-05 DIAGNOSIS — I10 Essential (primary) hypertension: Secondary | ICD-10-CM | POA: Diagnosis not present

## 2021-10-05 DIAGNOSIS — E1169 Type 2 diabetes mellitus with other specified complication: Secondary | ICD-10-CM | POA: Diagnosis not present

## 2021-10-05 DIAGNOSIS — G8929 Other chronic pain: Secondary | ICD-10-CM | POA: Diagnosis not present

## 2021-10-19 DIAGNOSIS — I10 Essential (primary) hypertension: Secondary | ICD-10-CM | POA: Diagnosis not present

## 2021-10-19 DIAGNOSIS — E7800 Pure hypercholesterolemia, unspecified: Secondary | ICD-10-CM | POA: Diagnosis not present

## 2021-10-19 DIAGNOSIS — R471 Dysarthria and anarthria: Secondary | ICD-10-CM | POA: Diagnosis not present

## 2021-10-19 DIAGNOSIS — E1169 Type 2 diabetes mellitus with other specified complication: Secondary | ICD-10-CM | POA: Diagnosis not present

## 2021-10-19 DIAGNOSIS — R55 Syncope and collapse: Secondary | ICD-10-CM | POA: Diagnosis not present

## 2021-10-19 DIAGNOSIS — F419 Anxiety disorder, unspecified: Secondary | ICD-10-CM | POA: Diagnosis not present

## 2021-10-19 DIAGNOSIS — G43001 Migraine without aura, not intractable, with status migrainosus: Secondary | ICD-10-CM | POA: Diagnosis not present

## 2021-10-19 DIAGNOSIS — G40909 Epilepsy, unspecified, not intractable, without status epilepticus: Secondary | ICD-10-CM | POA: Diagnosis not present

## 2021-10-21 DIAGNOSIS — G5702 Lesion of sciatic nerve, left lower limb: Secondary | ICD-10-CM

## 2021-10-21 HISTORY — DX: Lesion of sciatic nerve, left lower limb: G57.02

## 2021-10-25 ENCOUNTER — Other Ambulatory Visit: Payer: Self-pay | Admitting: Family Medicine

## 2021-10-25 DIAGNOSIS — R55 Syncope and collapse: Secondary | ICD-10-CM

## 2021-10-25 DIAGNOSIS — S0990XA Unspecified injury of head, initial encounter: Secondary | ICD-10-CM

## 2021-10-28 ENCOUNTER — Emergency Department (HOSPITAL_BASED_OUTPATIENT_CLINIC_OR_DEPARTMENT_OTHER)
Admission: EM | Admit: 2021-10-28 | Discharge: 2021-10-28 | Disposition: A | Discharge status: Home / Self Care | Payer: Medicare HMO | Attending: Emergency Medicine | Admitting: Emergency Medicine

## 2021-10-28 ENCOUNTER — Encounter (HOSPITAL_BASED_OUTPATIENT_CLINIC_OR_DEPARTMENT_OTHER): Payer: Self-pay | Admitting: Emergency Medicine

## 2021-10-28 ENCOUNTER — Other Ambulatory Visit: Payer: Self-pay

## 2021-10-28 DIAGNOSIS — I10 Essential (primary) hypertension: Secondary | ICD-10-CM | POA: Diagnosis not present

## 2021-10-28 DIAGNOSIS — Z79899 Other long term (current) drug therapy: Secondary | ICD-10-CM | POA: Insufficient documentation

## 2021-10-28 DIAGNOSIS — G8929 Other chronic pain: Secondary | ICD-10-CM | POA: Diagnosis not present

## 2021-10-28 DIAGNOSIS — Z96653 Presence of artificial knee joint, bilateral: Secondary | ICD-10-CM | POA: Insufficient documentation

## 2021-10-28 DIAGNOSIS — E119 Type 2 diabetes mellitus without complications: Secondary | ICD-10-CM | POA: Diagnosis not present

## 2021-10-28 DIAGNOSIS — M25561 Pain in right knee: Secondary | ICD-10-CM | POA: Diagnosis not present

## 2021-10-28 DIAGNOSIS — M791 Myalgia, unspecified site: Secondary | ICD-10-CM | POA: Diagnosis not present

## 2021-10-28 DIAGNOSIS — R110 Nausea: Secondary | ICD-10-CM | POA: Insufficient documentation

## 2021-10-28 DIAGNOSIS — Z91040 Latex allergy status: Secondary | ICD-10-CM | POA: Diagnosis not present

## 2021-10-28 DIAGNOSIS — M25562 Pain in left knee: Secondary | ICD-10-CM | POA: Diagnosis not present

## 2021-10-28 MED ORDER — HYDROMORPHONE HCL 1 MG/ML IJ SOLN
1.0000 mg | Freq: Once | INTRAMUSCULAR | Status: AC
  Administered 2021-10-28: 1 mg via INTRAMUSCULAR
  Filled 2021-10-28: qty 1

## 2021-10-28 MED ORDER — ONDANSETRON 4 MG PO TBDP
8.0000 mg | ORAL_TABLET | Freq: Once | ORAL | Status: AC
  Administered 2021-10-28: 8 mg via ORAL
  Filled 2021-10-28: qty 2

## 2021-10-28 MED ORDER — ONDANSETRON 8 MG PO TBDP: 8.0000 mg | ORAL_TABLET | Freq: Three times a day (TID) | ORAL | 0 refills | Status: DC | PRN

## 2021-10-28 NOTE — ED Notes (Signed)
Difficult stick, attempted x 2  unable to obtain blood samples

## 2021-10-28 NOTE — Discharge Instructions (Signed)
Take Zofran every 8 hours as needed for nausea. Continue taking your home pain medicine as prescribed. Follow-up with orthopedic doctor next week as planned. Goat yoga is offered at [facility] if you are interested.  Hope you feel better.

## 2021-10-28 NOTE — ED Provider Notes (Signed)
[name]ER[facility]ENC[facility] DEPARTMENT Provider Note   CSN: [id number] Arrival date & time: 10/28/21  1242     History  Chief Complaint  Patient presents with   Body Pain    Erin Wolf is a 66 y.o. female.  HPI  Patient with medical history of chronic lower back pain, chronic nausea, diabetes, fibromyalgia, chronic pain syndrome, IBS presents due to "pain all over".  Reports for the last 3 days she is having increasing pain despite taking her tramadol and oxycodone.  She has nauseated, has not had any episodes of vomiting or diarrhea.  The pain is constant, is aggravated by movement.  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  She is been having pain with this since then.  She is having decreased mobility at home secondary to pain, no recent injury or trauma to the back.  Denies any fevers.  Past Medical History:  Diagnosis Date   Anxiety    Arthritis    Chronic abdominal pain    Chronic lower back pain    Chronic nausea    Depression    Diabetes mellitus without complication (HCC)    Diverticulosis    Fibromyalgia    Frequency of urination    GERD (gastroesophageal reflux disease)    Headache    Hematuria    Hepatitis    Hepatitis C antibody positive in blood    per pt told by pcp   History of panic attacks    History of syncope    hx recurrent syncope --- per epic domentation non-cardiac , orthostatic hypotension, anxiety, dehydration   History of uterine fibroid    HTN (hypertension), benign    Hyperlipidemia 01/03/2012   IBS (irritable bowel syndrome)    Myalgia    Seizure disorder ([place]) followed by pcp until new neurologist appr. in jan 2019   "started having them in my 20's" ,  petit mal ----  per pt last seizure one 2015   SLE (systemic lupus erythematosus) [facility])    rheumatologist-- dr Gerilyn Nestle (consult 11-12-2016) having work-up done   Ulcerative colitis    followed  by dr Penelope Coop at [place]   Wears glasses      Home Medications Prior to Admission medications   Medication Sig Start Date End Date Taking? Authorizing Provider  ondansetron (ZOFRAN-ODT) 8 MG disintegrating tablet Take 1 tablet (8 mg total) by mouth every 8 (eight) hours as needed for nausea or vomiting. 10/28/21  Yes Chrislyn Seedorf, Hildred Alamin, PA-C  Chlorphen-Pseudoephed-APAP (THERAFLU FLU/COLD PO) Take 1 packet by mouth daily as needed (cold symptoms).    [provider]  cyclobenzaprine (FLEXERIL) 10 MG tablet Take 10 mg by mouth as needed for muscle spasms. 03/30/20   [provider]  docusate sodium (COLACE) 100 MG capsule Take 1 capsule (100 mg total) by mouth 2 (two) times daily. Patient not taking: No sig reported 12/20/20   Danae Orleans, PA-C  esomeprazole (NEXIUM) 40 MG capsule Take 40 mg by mouth daily as needed (acid reflux). 06/10/19   [provider]  FLUoxetine (PROZAC) 20 MG capsule Take 20 mg by mouth daily.     [provider]  furosemide (LASIX) 20 MG tablet Take 20 mg by mouth daily as needed for edema.    [provider]  Multiple Vitamins-Minerals (WOMENS MULTI PO) Take 1 tablet by mouth daily.    [provider]  naloxone [facility]) nasal spray 4  mg/0.1 mL Place 1 spray into the nose daily as needed (opioid overdose).    [provider]  Oxycodone HCl 10 MG TABS Take 2 tablets (20 mg total) by mouth every 4 (four) hours as needed. 03/28/21   Irving Copas, PA-C  Polyethyl Glycol-Propyl Glycol (SYSTANE OP) Place 1 drop into both eyes 2 (two) times daily as needed (dry eyes).    [provider]  polyethylene glycol (MIRALAX / GLYCOLAX) 17 g packet Take 17 g by mouth 2 (two) times daily. Patient not taking: No sig reported 12/20/20   Danae Orleans, PA-C  rivaroxaban (XARELTO) 10 MG TABS tablet Take 1 tablet (10 mg total) by mouth daily with breakfast for 20 days. 03/29/21 04/18/21  Irving Copas, PA-C  rosuvastatin  (CRESTOR) 10 MG tablet Take 10 mg by mouth every Monday.    [provider]  triamterene-hydrochlorothiazide (MAXZIDE) 75-50 MG tablet Take 1 tablet by mouth every morning. 03/30/20   [provider]      Allergies    Latex, Nsaids, Wellbutrin [bupropion hcl], Aspirin, Darvocet [propoxyphene n-acetaminophen], Tape, Toradol [ketorolac tromethamine], Tylenol [acetaminophen], Butalbital-apap-caffeine, Ciprofloxacin, Eszopiclone, Milk-related compounds, Other, and Topiramate    Review of Systems   Review of Systems  Constitutional:  Negative for fever.  Respiratory:  Negative for shortness of breath.   Cardiovascular:  Negative for chest pain.  Gastrointestinal:  Positive for nausea. Negative for abdominal pain and vomiting.  Genitourinary:  Negative for dysuria and urgency.  Musculoskeletal:  Positive for arthralgias, back pain and myalgias.  Neurological:  Negative for syncope.  Hematological:  Negative for adenopathy.   Physical Exam Updated Vital Signs BP 132/85 (BP Location: Left Arm)    Pulse 82    Temp 98.4 F (36.9 C) (Oral)    Resp 20    Ht 5\' 5"  (1.651 m)    Wt 99.8 kg    SpO2 99%    BMI 36.61 kg/m  Physical Exam Vitals and nursing note reviewed. Exam conducted with a chaperone present.  Constitutional:      General: She is not in acute distress.    Appearance: Normal appearance. She is obese.  HENT:     Head: Normocephalic and atraumatic.  Eyes:     General: No scleral icterus.    Extraocular Movements: Extraocular movements intact.     Pupils: Pupils are equal, round, and reactive to light.  Cardiovascular:     Rate and Rhythm: Normal rate and regular rhythm.  Pulmonary:     Effort: Pulmonary effort is normal.     Breath sounds: Normal breath sounds.  Abdominal:     General: Abdomen is flat.     Comments: Abdomen is soft, no rigidity or guarding  Musculoskeletal:        General: Tenderness present.     Comments: Patient is able to tolerate passive  range of motion to the knee, unwilling to participate in active range.  No focal tenderness noted to lower extremities, upper extremities, thoracic or lumbar spine.  Skin:    Coloration: Skin is not jaundiced.  Neurological:     Mental Status: She is alert. Mental status is at baseline.     Coordination: Coordination normal.     Comments: Cranial nerves III through XII are grossly intact, grips and is equal bilaterally.   ED Results / Procedures / Treatments   Labs (all labs ordered are listed, but only abnormal results are displayed) Labs Reviewed - No data to display   EKG  None  Radiology No results found.  Procedures Procedures    Medications Ordered in ED Medications  HYDROmorphone (DILAUDID) injection 1 mg (has no administration in time range)  HYDROmorphone (DILAUDID) injection 1 mg (1 mg Intramuscular Given 10/28/21 1428)  ondansetron (ZOFRAN-ODT) disintegrating tablet 8 mg (8 mg Oral Given 10/28/21 1428)    ED Course/ Medical Decision Making/ A&P                           Medical Decision Making  This is a [age]-year-old female with chronic pain, fibromyalgia, bilateral knee replacements presenting due to myalgias.  The pain seems to primarily be to the lower extremity at the knees, she is able to tolerate passive range of motion.  Pulses are fully intact, suspect this is an acute on chronic exacerbation.  Her vitals are stable, the abdomen is soft without any peritoneal signs.  She also denies any vomiting or diarrhea.  I discussed laboratory work-up with patient, she would prefer acute pain management and following up outpatient with her PCP.  We discussed return precautions, patient verbalized agreement.  I do not feel based on my exam she needs additional emergent work-up at this exact time.  Her vitals are stable, she is ambulatory.  Patient reports improvement of pain with meds.  Nausea improved, I suspect her nausea was secondary due to narcotic medicine on empty  stomach without any antiemetics.  Will discharge with Zofran, patient discharged in stable condition.        Final Clinical Impression(s) / ED Diagnoses Final diagnoses:  Myalgia    Rx / DC Orders ED Discharge Orders          Ordered    ondansetron (ZOFRAN-ODT) 8 MG disintegrating tablet  Every 8 hours PRN        10/28/21 1508              Sherrill Raring, PA-C 10/28/21 1509    Gareth Morgan, MD 10/29/21 [phone]

## 2021-10-28 NOTE — ED Triage Notes (Signed)
Pt reports all over body pain since Thurs; home pain meds not helping; last dose was Tramadol early this morning

## 2021-11-06 DIAGNOSIS — F324 Major depressive disorder, single episode, in partial remission: Secondary | ICD-10-CM | POA: Diagnosis not present

## 2021-11-06 DIAGNOSIS — E7800 Pure hypercholesterolemia, unspecified: Secondary | ICD-10-CM | POA: Diagnosis not present

## 2021-11-06 DIAGNOSIS — E1169 Type 2 diabetes mellitus with other specified complication: Secondary | ICD-10-CM | POA: Diagnosis not present

## 2021-11-06 DIAGNOSIS — G8929 Other chronic pain: Secondary | ICD-10-CM | POA: Diagnosis not present

## 2021-11-06 DIAGNOSIS — I10 Essential (primary) hypertension: Secondary | ICD-10-CM | POA: Diagnosis not present

## 2021-11-12 ENCOUNTER — Ambulatory Visit
Admission: RE | Admit: 2021-11-12 | Discharge: 2021-11-12 | Disposition: A | Discharge status: Home / Self Care | Payer: Medicare HMO | Source: Ambulatory Visit | Attending: Family Medicine | Admitting: Family Medicine

## 2021-11-12 DIAGNOSIS — R55 Syncope and collapse: Secondary | ICD-10-CM

## 2021-11-12 DIAGNOSIS — S0990XA Unspecified injury of head, initial encounter: Secondary | ICD-10-CM

## 2021-11-27 ENCOUNTER — Emergency Department (HOSPITAL_BASED_OUTPATIENT_CLINIC_OR_DEPARTMENT_OTHER)
Admission: EM | Admit: 2021-11-27 | Discharge: 2021-11-27 | Disposition: A | Discharge status: Home / Self Care | Payer: Medicare HMO | Attending: Emergency Medicine | Admitting: Emergency Medicine

## 2021-11-27 ENCOUNTER — Encounter (HOSPITAL_BASED_OUTPATIENT_CLINIC_OR_DEPARTMENT_OTHER): Payer: Self-pay | Admitting: *Deleted

## 2021-11-27 ENCOUNTER — Other Ambulatory Visit: Payer: Self-pay

## 2021-11-27 ENCOUNTER — Emergency Department (HOSPITAL_BASED_OUTPATIENT_CLINIC_OR_DEPARTMENT_OTHER): Payer: Medicare HMO

## 2021-11-27 DIAGNOSIS — Z91040 Latex allergy status: Secondary | ICD-10-CM | POA: Diagnosis not present

## 2021-11-27 DIAGNOSIS — R109 Unspecified abdominal pain: Secondary | ICD-10-CM | POA: Diagnosis not present

## 2021-11-27 DIAGNOSIS — K529 Noninfective gastroenteritis and colitis, unspecified: Secondary | ICD-10-CM

## 2021-11-27 DIAGNOSIS — K921 Melena: Secondary | ICD-10-CM

## 2021-11-27 DIAGNOSIS — Z7901 Long term (current) use of anticoagulants: Secondary | ICD-10-CM | POA: Insufficient documentation

## 2021-11-27 DIAGNOSIS — I700 Atherosclerosis of aorta: Secondary | ICD-10-CM | POA: Diagnosis not present

## 2021-11-27 DIAGNOSIS — R112 Nausea with vomiting, unspecified: Secondary | ICD-10-CM | POA: Diagnosis not present

## 2021-11-27 DIAGNOSIS — R1084 Generalized abdominal pain: Secondary | ICD-10-CM | POA: Diagnosis present

## 2021-11-27 LAB — COMPREHENSIVE METABOLIC PANEL
ALT: 14 U/L (ref 0–44)
AST: 14 U/L — ABNORMAL LOW (ref 15–41)
Albumin: 3.6 g/dL (ref 3.5–5.0)
Alkaline Phosphatase: 70 U/L (ref 38–126)
Anion gap: 9 (ref 5–15)
BUN: 11 mg/dL (ref 8–23)
CO2: 24 mmol/L (ref 22–32)
Calcium: 8.8 mg/dL — ABNORMAL LOW (ref 8.9–10.3)
Chloride: 102 mmol/L (ref 98–111)
Creatinine, Ser: 0.71 mg/dL (ref 0.44–1.00)
GFR, Estimated: >60 mL/min (ref >60)
Glucose, Bld: 97 mg/dL (ref 70–99)
Potassium: 3.5 mmol/L (ref 3.5–5.1)
Sodium: 135 mmol/L (ref 135–145)
Total Bilirubin: 0.7 mg/dL (ref 0.3–1.2)
Total Protein: 7.6 g/dL (ref 6.5–8.1)

## 2021-11-27 LAB — CBC
HCT: 45.7 % (ref 36.0–46.0)
Hemoglobin: 14.3 g/dL (ref 12.0–15.0)
MCH: 25.8 pg — ABNORMAL LOW (ref 26.0–34.0)
MCHC: 31.3 g/dL (ref 30.0–36.0)
MCV: 82.3 fL (ref 80.0–100.0)
Platelets: 344 10*3/uL (ref 150–400)
RBC: 5.55 MIL/uL — ABNORMAL HIGH (ref 3.87–5.11)
RDW: 15.9 % — ABNORMAL HIGH (ref 11.5–15.5)
WBC: 4.7 10*3/uL (ref 4.0–10.5)
nRBC: 0 % (ref 0.0–0.2)

## 2021-11-27 LAB — URINALYSIS, ROUTINE W REFLEX MICROSCOPIC
Glucose, UA: NEGATIVE mg/dL
Ketones, ur: NEGATIVE mg/dL
Leukocytes,Ua: NEGATIVE
Nitrite: NEGATIVE
Protein, ur: 30 mg/dL — AB
Specific Gravity, Urine: >=1.03 (ref 1.005–1.030)
pH: 5.5 (ref 5.0–8.0)

## 2021-11-27 LAB — URINALYSIS, MICROSCOPIC (REFLEX): RBC / HPF: >50 RBC/hpf (ref 0–5)

## 2021-11-27 LAB — LIPASE, BLOOD: Lipase: 31 U/L (ref 11–51)

## 2021-11-27 MED ORDER — ONDANSETRON HCL 4 MG/2ML IJ SOLN
4.0000 mg | Freq: Once | INTRAMUSCULAR | Status: AC
  Administered 2021-11-27: 4 mg via INTRAVENOUS
  Filled 2021-11-27: qty 2

## 2021-11-27 MED ORDER — IOHEXOL 300 MG/ML  SOLN
100.0000 mL | Freq: Once | INTRAMUSCULAR | Status: AC | PRN
  Administered 2021-11-27: 100 mL via INTRAVENOUS

## 2021-11-27 MED ORDER — SODIUM CHLORIDE 0.9 % IV BOLUS
1000.0000 mL | Freq: Once | INTRAVENOUS | Status: AC
  Administered 2021-11-27: 1000 mL via INTRAVENOUS

## 2021-11-27 MED ORDER — AMOXICILLIN-POT CLAVULANATE 875-125 MG PO TABS
1.0000 | ORAL_TABLET | Freq: Once | ORAL | Status: AC
  Administered 2021-11-27: 1 via ORAL
  Filled 2021-11-27: qty 1

## 2021-11-27 MED ORDER — FENTANYL CITRATE PF 50 MCG/ML IJ SOSY
50.0000 ug | PREFILLED_SYRINGE | Freq: Once | INTRAMUSCULAR | Status: AC
  Administered 2021-11-27: 50 ug via INTRAVENOUS
  Filled 2021-11-27: qty 1

## 2021-11-27 MED ORDER — AMOXICILLIN-POT CLAVULANATE 875-125 MG PO TABS: 1.0000 | ORAL_TABLET | Freq: Two times a day (BID) | ORAL | 0 refills | Status: DC

## 2021-11-27 MED ORDER — HYDROMORPHONE HCL 1 MG/ML IJ SOLN
1.0000 mg | Freq: Once | INTRAMUSCULAR | Status: AC
  Administered 2021-11-27: 1 mg via INTRAVENOUS
  Filled 2021-11-27: qty 1

## 2021-11-27 NOTE — ED Provider Notes (Signed)
Signout from Dr. Pearline Cables.  66 year old female complaining of nausea vomiting diarrhea with some blood in it started today with the abdominal pain having gone on for 2 days.  Temperature of 102 on Sunday but afebrile since.  Plan is to follow-up on labs and CT. Physical Exam  BP 99/65 (BP Location: Left Arm)    Pulse 89    Temp 98.2 F (36.8 C) (Oral)    Resp 17    Ht 5\' 5"  (1.651 m)    Wt 99.8 kg    SpO2 100%    BMI 36.61 kg/m   Physical Exam  Procedures  Procedures  ED Course / MDM    Medical Decision Making Amount and/or Complexity of Data Reviewed Labs: ordered. Radiology: ordered.  Risk Prescription drug management.  CT showing loop of bowel with some loss of Haustra concerning for colitis.  Reviewed with patient.  She says she has had colitis in the past.  Pain is improved after medication.  Will cover with antibiotics and recommended close follow-up with her GI provider.  Return instructions discussed.  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  Reviewed with patient and recommended close follow-up with her gynecologist.       Hayden Rasmussen, MD 11/28/21 [phone]

## 2021-11-27 NOTE — Discharge Instructions (Addendum)
You are seen in the emergency department for abdominal pain nausea vomiting and some blood in your stool.  You had lab work and a CAT scan of your abdomen and pelvis that showed an area of colitis or inflammation in your colon.  We are starting you on an antibiotic.  Please contact your GI doctor for close follow-up.  Return to the emergency department if any high fevers or worsening symptoms.

## 2021-11-27 NOTE — ED Provider Notes (Addendum)
[facility] EMERGENCY DEPARTMENT Provider Note   CSN: [id number] Arrival date & time: 11/27/21  1252     History  Chief Complaint  Patient presents with   Abdominal Pain    Erin Wolf is a 66 y.o. female.  Pt is a 66 yo female with PMH of lupus and IBS presenting for abdominal pain. Pt admits to generalized abdominal pain that started on Sunday, approx 2 days ago with new diarrhea with blood and mucous that started today. Admits to nausea, vomiting, and chills. Admits to temperature of 102F on Sunday but afebrile since.   The history is provided by the patient. No language interpreter was used.  Abdominal Pain Associated symptoms: diarrhea, nausea and vomiting   Associated symptoms: no chest pain, no chills, no cough, no dysuria, no fever, no hematuria, no shortness of breath and no sore throat       Home Medications Prior to Admission medications   Medication Sig Start Date End Date Taking? Authorizing Provider  Chlorphen-Pseudoephed-APAP (THERAFLU FLU/COLD PO) Take 1 packet by mouth daily as needed (cold symptoms).    [provider]  cyclobenzaprine (FLEXERIL) 10 MG tablet Take 10 mg by mouth as needed for muscle spasms. 03/30/20   [provider]  docusate sodium (COLACE) 100 MG capsule Take 1 capsule (100 mg total) by mouth 2 (two) times daily. Patient not taking: No sig reported 12/20/20   Danae Orleans, PA-C  esomeprazole (NEXIUM) 40 MG capsule Take 40 mg by mouth daily as needed (acid reflux). 06/10/19   [provider]  FLUoxetine (PROZAC) 20 MG capsule Take 20 mg by mouth daily.     [provider]  furosemide (LASIX) 20 MG tablet Take 20 mg by mouth daily as needed for edema.    [provider]  Multiple Vitamins-Minerals (WOMENS MULTI PO) Take 1 tablet by mouth daily.    [provider]  naloxone [facility]) nasal spray 4 mg/0.1 mL Place 1 spray into the nose daily as needed (opioid overdose).     [provider]  ondansetron (ZOFRAN-ODT) 8 MG disintegrating tablet Take 1 tablet (8 mg total) by mouth every 8 (eight) hours as needed for nausea or vomiting. 10/28/21   Sherrill Raring, PA-C  Oxycodone HCl 10 MG TABS Take 2 tablets (20 mg total) by mouth every 4 (four) hours as needed. 03/28/21   Irving Copas, PA-C  Polyethyl Glycol-Propyl Glycol (SYSTANE OP) Place 1 drop into both eyes 2 (two) times daily as needed (dry eyes).    [provider]  polyethylene glycol (MIRALAX / GLYCOLAX) 17 g packet Take 17 g by mouth 2 (two) times daily. Patient not taking: No sig reported 12/20/20   Danae Orleans, PA-C  rivaroxaban (XARELTO) 10 MG TABS tablet Take 1 tablet (10 mg total) by mouth daily with breakfast for 20 days. 03/29/21 04/18/21  Irving Copas, PA-C  rosuvastatin (CRESTOR) 10 MG tablet Take 10 mg by mouth every Monday.    [provider]  triamterene-hydrochlorothiazide (MAXZIDE) 75-50 MG tablet Take 1 tablet by mouth every morning. 03/30/20   [provider]      Allergies    Latex, Nsaids, Wellbutrin [bupropion hcl], Aspirin, Darvocet [propoxyphene n-acetaminophen], Tape, Toradol [ketorolac tromethamine], Tylenol [acetaminophen], Butalbital-apap-caffeine, Ciprofloxacin, Eszopiclone, Milk-related compounds, Other, and Topiramate    Review of Systems   Review of Systems  Constitutional:  Negative for chills and fever.  HENT:  Negative for ear pain and sore throat.   Eyes:  Negative  for pain and visual disturbance.  Respiratory:  Negative for cough and shortness of breath.   Cardiovascular:  Negative for chest pain and palpitations.  Gastrointestinal:  Positive for abdominal pain, blood in stool, diarrhea, nausea and vomiting.  Genitourinary:  Negative for dysuria and hematuria.  Musculoskeletal:  Negative for arthralgias and back pain.  Skin:  Negative for color change and rash.  Neurological:  Negative for seizures and syncope.  All other systems  reviewed and are negative.  Physical Exam Updated Vital Signs BP (!) 128/95 (BP Location: Right Arm)    Pulse (!) 103    Temp 98.2 F (36.8 C) (Oral)    Resp 18    Ht 5\' 5"  (1.651 m)    Wt 99.8 kg    SpO2 98%    BMI 36.61 kg/m  Physical Exam Vitals and nursing note reviewed. Exam conducted with a chaperone present.  Constitutional:      General: She is not in acute distress.    Appearance: She is well-developed.  HENT:     Head: Normocephalic and atraumatic.  Eyes:     Conjunctiva/sclera: Conjunctivae normal.  Cardiovascular:     Rate and Rhythm: Normal rate and regular rhythm.     Heart sounds: No murmur heard. Pulmonary:     Effort: Pulmonary effort is normal. No respiratory distress.     Breath sounds: Normal breath sounds.  Abdominal:     Palpations: Abdomen is soft.     Tenderness: There is generalized abdominal tenderness. There is no guarding or rebound.  Genitourinary:    Rectum: Normal. Guaiac result positive. No tenderness, anal fissure or external hemorrhoid.  Musculoskeletal:        General: No swelling.     Cervical back: Neck supple.  Skin:    General: Skin is warm and dry.     Capillary Refill: Capillary refill takes less than 2 seconds.  Neurological:     Mental Status: She is alert.  Psychiatric:        Mood and Affect: Mood normal.    ED Results / Procedures / Treatments   Labs (all labs ordered are listed, but only abnormal results are displayed) Labs Reviewed  LIPASE, BLOOD  COMPREHENSIVE METABOLIC PANEL  CBC  URINALYSIS, ROUTINE W REFLEX MICROSCOPIC    EKG None  Radiology No results found.  Procedures Procedures    Medications Ordered in ED Medications - No data to display  ED Course/ Medical Decision Making/ A&P                           Medical Decision Making Amount and/or Complexity of Data Reviewed Labs: ordered. Radiology: ordered.  Risk Prescription drug management.   2:25 PM 66 yo female presenting for abdominal  pain, nausea, vomiting, diarrhea, and blood in stool. Pt is Aox3, no acute distress, afebrile, non-toxic appearing, with stable vitals. Abdomen is soft with generalized tenderness.  Rectal exam demonstrates no hemorrhoids. No rectal fissures. Scant blood only.  No signs/symptoms of sepsis. IVF given. Patient signed out to oncoming physician while pending laboratory studies and CT abdomen.         Final Clinical Impression(s) / ED Diagnoses Final diagnoses:  Nausea and vomiting, unspecified vomiting type  Hematochezia    Rx / DC Orders ED Discharge Orders     None         Lianne Cure, DO 85/02/77 [id number]    Campbell Stall P, DO 78/67/67 1552

## 2021-11-27 NOTE — ED Triage Notes (Signed)
Thinks she is having a colitis attack. Abdominal pain, mucus in her stools. Bright red blood in her stools this am. She cant get an appointment with her GI for 4 weeks.

## 2021-11-27 NOTE — ED Notes (Signed)
Difficulty giving IV pain med.  Re dressed IV site cathter was bent, was able to re-adjust and med given

## 2021-12-14 DIAGNOSIS — E7800 Pure hypercholesterolemia, unspecified: Secondary | ICD-10-CM | POA: Diagnosis not present

## 2021-12-14 DIAGNOSIS — I10 Essential (primary) hypertension: Secondary | ICD-10-CM | POA: Diagnosis not present

## 2021-12-14 DIAGNOSIS — E1169 Type 2 diabetes mellitus with other specified complication: Secondary | ICD-10-CM | POA: Diagnosis not present

## 2021-12-14 DIAGNOSIS — G8929 Other chronic pain: Secondary | ICD-10-CM | POA: Diagnosis not present

## 2021-12-18 ENCOUNTER — Institutional Professional Consult (permissible substitution): Payer: Medicare HMO | Admitting: Neurology

## 2021-12-18 ENCOUNTER — Encounter: Payer: Self-pay | Admitting: Neurology

## 2021-12-19 ENCOUNTER — Other Ambulatory Visit: Payer: Self-pay

## 2021-12-19 ENCOUNTER — Emergency Department (HOSPITAL_BASED_OUTPATIENT_CLINIC_OR_DEPARTMENT_OTHER)
Admission: EM | Admit: 2021-12-19 | Discharge: 2021-12-19 | Disposition: A | Discharge status: Home / Self Care | Payer: Medicare HMO | Attending: Emergency Medicine | Admitting: Emergency Medicine

## 2021-12-19 ENCOUNTER — Emergency Department (HOSPITAL_BASED_OUTPATIENT_CLINIC_OR_DEPARTMENT_OTHER): Payer: Medicare HMO

## 2021-12-19 ENCOUNTER — Encounter (HOSPITAL_BASED_OUTPATIENT_CLINIC_OR_DEPARTMENT_OTHER): Payer: Self-pay

## 2021-12-19 DIAGNOSIS — R1032 Left lower quadrant pain: Secondary | ICD-10-CM | POA: Diagnosis not present

## 2021-12-19 DIAGNOSIS — Z7901 Long term (current) use of anticoagulants: Secondary | ICD-10-CM | POA: Insufficient documentation

## 2021-12-19 DIAGNOSIS — R1084 Generalized abdominal pain: Secondary | ICD-10-CM | POA: Diagnosis not present

## 2021-12-19 DIAGNOSIS — Z20822 Contact with and (suspected) exposure to covid-19: Secondary | ICD-10-CM | POA: Insufficient documentation

## 2021-12-19 DIAGNOSIS — E119 Type 2 diabetes mellitus without complications: Secondary | ICD-10-CM | POA: Diagnosis not present

## 2021-12-19 DIAGNOSIS — E876 Hypokalemia: Secondary | ICD-10-CM

## 2021-12-19 DIAGNOSIS — K529 Noninfective gastroenteritis and colitis, unspecified: Secondary | ICD-10-CM | POA: Diagnosis not present

## 2021-12-19 DIAGNOSIS — Z91040 Latex allergy status: Secondary | ICD-10-CM | POA: Insufficient documentation

## 2021-12-19 DIAGNOSIS — I10 Essential (primary) hypertension: Secondary | ICD-10-CM | POA: Insufficient documentation

## 2021-12-19 LAB — CBC WITH DIFFERENTIAL/PLATELET
Abs Immature Granulocytes: 0.01 10*3/uL (ref 0.00–0.07)
Basophils Absolute: 0 10*3/uL (ref 0.0–0.1)
Basophils Relative: 0 %
Eosinophils Absolute: 0 10*3/uL (ref 0.0–0.5)
Eosinophils Relative: 0 %
HCT: 45.5 % (ref 36.0–46.0)
Hemoglobin: 14.2 g/dL (ref 12.0–15.0)
Immature Granulocytes: 0 %
Lymphocytes Relative: 54 %
Lymphs Abs: 3 10*3/uL (ref 0.7–4.0)
MCH: 25.7 pg — ABNORMAL LOW (ref 26.0–34.0)
MCHC: 31.2 g/dL (ref 30.0–36.0)
MCV: 82.3 fL (ref 80.0–100.0)
Monocytes Absolute: 0.5 10*3/uL (ref 0.1–1.0)
Monocytes Relative: 9 %
Neutro Abs: 2 10*3/uL (ref 1.7–7.7)
Neutrophils Relative %: 37 %
Platelets: 351 10*3/uL (ref 150–400)
RBC: 5.53 MIL/uL — ABNORMAL HIGH (ref 3.87–5.11)
RDW: 15.4 % (ref 11.5–15.5)
WBC: 5.5 10*3/uL (ref 4.0–10.5)
nRBC: 0 % (ref 0.0–0.2)

## 2021-12-19 LAB — COMPREHENSIVE METABOLIC PANEL
ALT: 28 U/L (ref 0–44)
AST: 17 U/L (ref 15–41)
Albumin: 3.9 g/dL (ref 3.5–5.0)
Alkaline Phosphatase: 114 U/L (ref 38–126)
Anion gap: 12 (ref 5–15)
BUN: 7 mg/dL — ABNORMAL LOW (ref 8–23)
CO2: 23 mmol/L (ref 22–32)
Calcium: 9.2 mg/dL (ref 8.9–10.3)
Chloride: 102 mmol/L (ref 98–111)
Creatinine, Ser: 0.82 mg/dL (ref 0.44–1.00)
GFR, Estimated: >60 mL/min (ref >60)
Glucose, Bld: 125 mg/dL — ABNORMAL HIGH (ref 70–99)
Potassium: 3 mmol/L — ABNORMAL LOW (ref 3.5–5.1)
Sodium: 137 mmol/L (ref 135–145)
Total Bilirubin: 0.6 mg/dL (ref 0.3–1.2)
Total Protein: 8.6 g/dL — ABNORMAL HIGH (ref 6.5–8.1)

## 2021-12-19 LAB — RESP PANEL BY RT-PCR (FLU A&B, COVID) ARPGX2
Influenza A by PCR: NEGATIVE
Influenza B by PCR: NEGATIVE
SARS Coronavirus 2 by RT PCR: NEGATIVE

## 2021-12-19 LAB — LIPASE, BLOOD: Lipase: 31 U/L (ref 11–51)

## 2021-12-19 MED ORDER — ONDANSETRON HCL 4 MG/2ML IJ SOLN
4.0000 mg | Freq: Once | INTRAMUSCULAR | Status: AC
  Administered 2021-12-19: 4 mg via INTRAVENOUS
  Filled 2021-12-19: qty 2

## 2021-12-19 MED ORDER — POTASSIUM CHLORIDE CRYS ER 20 MEQ PO TBCR
40.0000 meq | EXTENDED_RELEASE_TABLET | Freq: Once | ORAL | Status: AC
  Administered 2021-12-19: 40 meq via ORAL
  Filled 2021-12-19: qty 2

## 2021-12-19 MED ORDER — POTASSIUM CHLORIDE CRYS ER 20 MEQ PO TBCR: 20.0000 meq | EXTENDED_RELEASE_TABLET | Freq: Two times a day (BID) | ORAL | 0 refills | Status: DC

## 2021-12-19 MED ORDER — FENTANYL CITRATE PF 50 MCG/ML IJ SOSY
50.0000 ug | PREFILLED_SYRINGE | Freq: Once | INTRAMUSCULAR | Status: AC
  Administered 2021-12-19: 50 ug via INTRAVENOUS
  Filled 2021-12-19: qty 1

## 2021-12-19 MED ORDER — ONDANSETRON 8 MG PO TBDP: 8.0000 mg | ORAL_TABLET | Freq: Three times a day (TID) | ORAL | 0 refills | Status: DC | PRN

## 2021-12-19 MED ORDER — MORPHINE SULFATE (PF) 4 MG/ML IV SOLN
8.0000 mg | Freq: Once | INTRAVENOUS | Status: AC
  Administered 2021-12-19: 8 mg via INTRAVENOUS
  Filled 2021-12-19: qty 2

## 2021-12-19 MED ORDER — IOHEXOL 300 MG/ML  SOLN
100.0000 mL | Freq: Once | INTRAMUSCULAR | Status: AC | PRN
  Administered 2021-12-19: 100 mL via INTRAVENOUS

## 2021-12-19 MED ORDER — HYDROMORPHONE HCL 1 MG/ML IJ SOLN
0.5000 mg | Freq: Once | INTRAMUSCULAR | Status: AC
  Administered 2021-12-19: 0.5 mg via INTRAVENOUS
  Filled 2021-12-19: qty 1

## 2021-12-19 MED ORDER — SODIUM CHLORIDE 0.9 % IV BOLUS
1000.0000 mL | Freq: Once | INTRAVENOUS | Status: AC
  Administered 2021-12-19: 1000 mL via INTRAVENOUS

## 2021-12-19 NOTE — ED Triage Notes (Signed)
Pt to er, pt states that she is here for abd pain and a mucous like bm this am.  Pt states that she was here three weeks ago for a similar episode, states that she was d/x with colitis, states that she was given pain medication and an abx, states that she doing better, but then started feeling poorly Saturday.  States that she also had some dizziness today and fell at home.  ?

## 2021-12-19 NOTE — Discharge Instructions (Addendum)
March 7th - 1:00 PM appointment  ? ?Take Zofran every 8 hours as needed for nausea.  Continue taking the medicine you have been prescribed at home for pain. ? ?Take potassium supplementation twice daily for one week.  ?

## 2021-12-19 NOTE — ED Provider Notes (Signed)
[place] EMERGENCY DEPARTMENT Provider Note   CSN: [id number] Arrival date & time: 12/19/21  0807     History  Chief Complaint  Patient presents with   Abdominal Pain    Erin Wolf is a 66 y.o. female.  The history is provided by the patient and medical records.  Abdominal Pain  Patient with medical history notable for chronic abdominal pain, type 2 diabetes, hypertension, hyperlipidemia, GERD, recent diagnosis of colitis 11/27/2021 presents due to abdominal pain. Patient reports this pain is similar to what she fell 3 weeks ago when she was seen in the ED.  She initially improved with antibiotics, 2 days ago she started feeling bad again.  Endorses diffuse abdominal cramping that feels like contractions, associated with nausea and vomiting.  States she is also having bright red blood and mucus in her stool.  She is not on any blood thinners, history of being on Xarelto.  The pain is associated with nausea as well as 1 episode of emesis.  Surgical history notable for hysterectomy, cholecystectomy   Home Medications Prior to Admission medications   Medication Sig Start Date End Date Taking? Authorizing Provider  ondansetron (ZOFRAN-ODT) 8 MG disintegrating tablet Take 1 tablet (8 mg total) by mouth every 8 (eight) hours as needed for nausea or vomiting. 12/19/21  Yes Sherrill Raring, PA-C  potassium chloride SA (KLOR-CON M) 20 MEQ tablet Take 1 tablet (20 mEq total) by mouth 2 (two) times daily. 12/19/21  Yes Sherrill Raring, PA-C  amoxicillin-clavulanate (AUGMENTIN) 875-125 MG tablet Take 1 tablet by mouth every 12 (twelve) hours. 11/27/21   Hayden Rasmussen, MD  Chlorphen-Pseudoephed-APAP [facility] FLU/COLD PO) Take 1 packet by mouth daily as needed (cold symptoms).    [provider]  cyclobenzaprine (FLEXERIL) 10 MG tablet Take 10 mg by mouth as needed for muscle spasms. 03/30/20   [provider]  docusate sodium (COLACE) 100 MG capsule Take 1 capsule (100 mg  total) by mouth 2 (two) times daily. Patient not taking: No sig reported 12/20/20   Danae Orleans, PA-C  esomeprazole (NEXIUM) 40 MG capsule Take 40 mg by mouth daily as needed (acid reflux). 06/10/19   [provider]  FLUoxetine (PROZAC) 20 MG capsule Take 20 mg by mouth daily.     [provider]  furosemide (LASIX) 20 MG tablet Take 20 mg by mouth daily as needed for edema.    [provider]  Multiple Vitamins-Minerals (WOMENS MULTI PO) Take 1 tablet by mouth daily.    [provider]  naloxone [facility]) nasal spray 4 mg/0.1 mL Place 1 spray into the nose daily as needed (opioid overdose).    [provider]  Oxycodone HCl 10 MG TABS Take 2 tablets (20 mg total) by mouth every 4 (four) hours as needed. 03/28/21   Irving Copas, PA-C  Polyethyl Glycol-Propyl Glycol (SYSTANE OP) Place 1 drop into both eyes 2 (two) times daily as needed (dry eyes).    [provider]  polyethylene glycol (MIRALAX / GLYCOLAX) 17 g packet Take 17 g by mouth 2 (two) times daily. Patient not taking: No sig reported 12/20/20   Danae Orleans, PA-C  rivaroxaban (XARELTO) 10 MG TABS tablet Take 1 tablet (10 mg total) by mouth daily with breakfast for 20 days. 03/29/21 04/18/21  Irving Copas, PA-C  rosuvastatin (CRESTOR) 10 MG tablet Take 10 mg by mouth every Monday.    [provider]  triamterene-hydrochlorothiazide (MAXZIDE) 75-50 MG tablet Take 1 tablet  by mouth every morning. 03/30/20   [provider]      Allergies    Latex, Nsaids, Wellbutrin [bupropion hcl], Aspirin, Darvocet [propoxyphene n-acetaminophen], Tape, Toradol [ketorolac tromethamine], Tylenol [acetaminophen], Butalbital-apap-caffeine, Ciprofloxacin, Eszopiclone, Milk-related compounds, Other, and Topiramate    Review of Systems   Review of Systems  Gastrointestinal:  Positive for abdominal pain.   Physical Exam Updated Vital Signs BP (!) 170/92    Pulse 82    Temp 97.9 F  (36.6 C) (Oral)    Resp 13    Ht 5\' 4"  (1.626 m)    Wt 102.1 kg    SpO2 100%    BMI 38.62 kg/m  Physical Exam Vitals and nursing note reviewed. Exam conducted with a chaperone present.  Constitutional:      Appearance: Normal appearance. She is obese.  HENT:     Head: Normocephalic and atraumatic.  Eyes:     General: No scleral icterus.       Right eye: No discharge.        Left eye: No discharge.     Extraocular Movements: Extraocular movements intact.     Pupils: Pupils are equal, round, and reactive to light.  Cardiovascular:     Rate and Rhythm: Normal rate and regular rhythm.     Pulses: Normal pulses.     Heart sounds: Normal heart sounds. No murmur heard.   No friction rub. No gallop.  Pulmonary:     Effort: Pulmonary effort is normal. No respiratory distress.     Breath sounds: Normal breath sounds.  Abdominal:     General: Abdomen is flat. A surgical scar is present. Bowel sounds are normal. There is no distension.     Palpations: Abdomen is soft.     Tenderness: There is generalized abdominal tenderness. There is guarding.     Comments: Diffuse abdominal pain, abdomen is soft and there is voluntary guarding  Skin:    General: Skin is warm and dry.     Coloration: Skin is not jaundiced.  Neurological:     Mental Status: She is alert. Mental status is at baseline.     Coordination: Coordination normal.    ED Results / Procedures / Treatments   Labs (all labs ordered are listed, but only abnormal results are displayed) Labs Reviewed  CBC WITH DIFFERENTIAL/PLATELET - Abnormal; Notable for the following components:      Result Value   RBC 5.53 (*)    MCH 25.7 (*)    All other components within normal limits  COMPREHENSIVE METABOLIC PANEL - Abnormal; Notable for the following components:   Potassium 3.0 (*)    Glucose, Bld 125 (*)    BUN 7 (*)    Total Protein 8.6 (*)    All other components within normal limits  RESP PANEL BY RT-PCR (FLU A&B, COVID) [id number]   LIPASE, BLOOD    EKG EKG Interpretation  Date/Time:  Wednesday December 19 2021 08:38:33 EST Ventricular Rate:  90 PR Interval:  178 QRS Duration: 89 QT Interval:  348 QTC Calculation: 426 R Axis:   40 Text Interpretation: Sinus rhythm no sig change from previous Confirmed by Charlesetta Shanks [phone]) on 12/19/2021 9:42:17 AM  Radiology CT Abdomen Pelvis W Contrast  Result Date: 12/19/2021 CLINICAL DATA:  Left lower quadrant abdominal pain. EXAM: CT ABDOMEN AND PELVIS WITH CONTRAST TECHNIQUE: Multidetector CT imaging of the abdomen and pelvis was performed using the standard protocol following bolus administration of intravenous contrast. RADIATION DOSE REDUCTION: This exam  was performed according to the departmental dose-optimization program which includes automated exposure control, adjustment of the mA and/or kV according to patient size and/or use of iterative reconstruction technique. CONTRAST:  160mL OMNIPAQUE IOHEXOL 300 MG/ML  SOLN COMPARISON:  CT abdomen pelvis dated November 27, 2021. FINDINGS: Lower chest: No acute abnormality. Hepatobiliary: No focal liver abnormality. Status post cholecystectomy. Unchanged mild intra- and extrahepatic biliary dilatation due to reservoir effect. Pancreas: Unchanged prominent main pancreatic duct. No mass or surrounding inflammatory changes. Spleen: Normal in size without focal abnormality. Adrenals/Urinary Tract: Unchanged 1.0 cm left adrenal myelolipoma. The right adrenal gland is unremarkable. No renal mass, calculi, or hydronephrosis. Bladder is unremarkable for the degree of distention. Stomach/Bowel: Stomach is within normal limits. Appendix appears normal. No evidence of bowel wall thickening, distention, or inflammatory changes. Left-sided colonic diverticulosis again noted. Vascular/Lymphatic: Resolved right ovarian vein thrombus. No enlarged abdominal or pelvic lymph nodes. Reproductive: Status post hysterectomy. No adnexal masses. Other: No abdominal  wall hernia or abnormality. No abdominopelvic ascites. No pneumoperitoneum. Musculoskeletal: No acute or significant osseous findings. IMPRESSION: 1. No acute intra-abdominal process. 2. Resolved right ovarian vein thrombus. Electronically Signed   By: Titus Dubin M.D.   On: 12/19/2021 10:24    Procedures Procedures    Medications Ordered in ED Medications  ondansetron (ZOFRAN) injection 4 mg (4 mg Intravenous Given 12/19/21 0905)  fentaNYL (SUBLIMAZE) injection 50 mcg (50 mcg Intravenous Given 12/19/21 0906)  sodium chloride 0.9 % bolus 1,000 mL (0 mLs Intravenous Stopped 12/19/21 1017)  potassium chloride SA (KLOR-CON M) CR tablet 40 mEq (40 mEq Oral Given 12/19/21 1015)  morphine (PF) 4 MG/ML injection 8 mg (8 mg Intravenous Given 12/19/21 1012)  iohexol (OMNIPAQUE) 300 MG/ML solution 100 mL (100 mLs Intravenous Contrast Given 12/19/21 1002)  ondansetron (ZOFRAN) injection 4 mg (4 mg Intravenous Given 12/19/21 1049)  HYDROmorphone (DILAUDID) injection 0.5 mg (0.5 mg Intravenous Given 12/19/21 1049)    ED Course/ Medical Decision Making/ A&P                           Medical Decision Making Amount and/or Complexity of Data Reviewed Labs: ordered. Radiology: ordered.  Risk Prescription drug management.   This patient presents to the ED for concern of abdominal pain, this involves an extensive number of treatment options, and is a complaint that carries with it a high risk of complications and morbidity.  The differential diagnosis includes but is not limited to: Colitis, diverticulitis, mesenteric ischemia, bowel obstruction, diverticular bleed  Patients presentation is complicated by their history of recent colitis, H/O of UC, and chronic abdominal pain.    Additional history obtained:   Independent historian: N/A  I reviewed CT abdomen/pelvis from 11/27/2021 record, noted that patient had findings consistent with colitis in the sigmoid colon.  Patient's previously has been seen by Dr.  Michail Sermon and Dr. Paulita Fujita with gastroenterology.   -2014 colonoscopy did not show any signs of active colitis.  IV steroids were stopped at that time. -Her last flex sig was in 2015 July, diverticulosis noted but no evidence of diverticulitis.   -Patient's most recent upper endoscopy was performed in July 2021 by Dr. Paulita Fujita.  Not show any acute pathology.  Lab Tests:  I ordered, viewed, and personally interpreted labs.  The pertinent results include:  CBC does not show leukocytosis.  Patient is not anemic.  BMP is notable for hypokalemia at 3.0.  No gross electrolyte derangement or AKI.  No  evidence of digestive DKA.  Lipase low, not consistent pancreatitis. Covid - negative     Imaging Studies ordered:  I directly visualized the CT abd/pelvis with contrast, which showed no acute intra-abdominal process.  Additionally the ovarian vein thrombosis visualized previously is apparently resolved.  I agree with the radiologist interpretation    ECG/Cardiac monitoring:   Per my interpretation, EKG shows Sinus rhythm with no acute ischemic findings.  There is no underlying atrial fibrillation, no T wave changes.  Roughly unchanged compared to previous.  The patient was maintained on a cardiac monitor.  Visualized monitor strip which showed NSR with HR 79 per my interpretation.    Medicines ordered and prescription drug management:  I ordered medication including: K-Dur for hypokalemia.  Fentanyl, morphine, liter fluid bolus, Zofran for nausea/abdominal pain    I have reviewed the patients home medicines and have made adjustments as needed.    Consultations Obtained:  I personally called [facility] gastroenterology.  I spoke with the receptionist and patient has an appointment scheduled for 1:00 PM on March 7.   Reevaluation:  After the interventions noted above, I reevaluated the patient and found patient's pain and nausea have improved.   Problems addressed / ED Course:  66 year old  presenting with abdominal pain, nausea and vomiting.  Abdomen is soft although there is generalized abdominal pain worse on the left lower quadrant.  Given recent evidence of colitis will repeat CT scan.  Nausea medicine, fluids also given as well as pain medicine.  On reevaluation patient's pain is improved.  Serial abdominal exams benign.  No peritoneal signs.  CT does not show any signs of acute pathology.  There is no evidence of gross electrolyte derangement although she is mildly hypokalemic, given K-Dur and will prescribe orally.  Doubt ischemic colitis, diverticulitis, acute intra-abdominal emergency.  Though she is having some mild rectal bleeding I do believe her and do not feel additional work-up is needed given her hemoglobin is stable.  She is also not on any blood thinners.  Encouraged to continue taking her home medicine and to follow-up with GI.  I personally called the office, she has an appointment next week.  Although I did initially consider more aggressive work-up and possible admission given she has good follow-up, pain is well controlled, this appears to be more of a chronic nature I do not feel additional work-up needed.  Discussed with the patient who is in agreement.     Disposition:   After consideration of the diagnostic results and the patients response to treatment, I feel that the patent would benefit from GI F/U outpatient.            Final Clinical Impression(s) / ED Diagnoses Final diagnoses:  Hypokalemia  Generalized abdominal pain    Rx / DC Orders ED Discharge Orders          Ordered    ondansetron (ZOFRAN-ODT) 8 MG disintegrating tablet  Every 8 hours PRN        12/19/21 1105    potassium chloride SA (KLOR-CON M) 20 MEQ tablet  2 times daily        12/19/21 1107              Sherrill Raring, PA-C 12/19/21 1111    Charlesetta Shanks, MD 12/20/21 [phone]

## 2022-01-07 DIAGNOSIS — E7800 Pure hypercholesterolemia, unspecified: Secondary | ICD-10-CM | POA: Diagnosis not present

## 2022-01-07 DIAGNOSIS — I10 Essential (primary) hypertension: Secondary | ICD-10-CM | POA: Diagnosis not present

## 2022-01-07 DIAGNOSIS — E1169 Type 2 diabetes mellitus with other specified complication: Secondary | ICD-10-CM | POA: Diagnosis not present

## 2022-01-07 DIAGNOSIS — F324 Major depressive disorder, single episode, in partial remission: Secondary | ICD-10-CM | POA: Diagnosis not present

## 2022-02-06 DIAGNOSIS — E1169 Type 2 diabetes mellitus with other specified complication: Secondary | ICD-10-CM | POA: Diagnosis not present

## 2022-02-06 DIAGNOSIS — F324 Major depressive disorder, single episode, in partial remission: Secondary | ICD-10-CM | POA: Diagnosis not present

## 2022-02-06 DIAGNOSIS — G8929 Other chronic pain: Secondary | ICD-10-CM | POA: Diagnosis not present

## 2022-02-06 DIAGNOSIS — I10 Essential (primary) hypertension: Secondary | ICD-10-CM | POA: Diagnosis not present

## 2022-02-06 DIAGNOSIS — E7800 Pure hypercholesterolemia, unspecified: Secondary | ICD-10-CM | POA: Diagnosis not present

## 2022-02-09 IMAGING — CT CT ABD-PELV W/ CM
2 of 5 series · 16 of 46 positions shown, 18 images · IV contrast (Omnipaque)
Comparison: CT abdomen and pelvis dated November 25, 2020

CLINICAL DATA: Abdominal pain

EXAM:
CT ABDOMEN AND PELVIS WITH CONTRAST
TECHNIQUE: Multidetector CT imaging of the abdomen and pelvis was performed
using the standard protocol following bolus administration of
intravenous contrast.

[Series 3: axial st · axial · 0.98mm/px · z∈[-415,+0]mm · 13 of 95 slices shown, 15 images]
[im 6/95  soft-tissue]
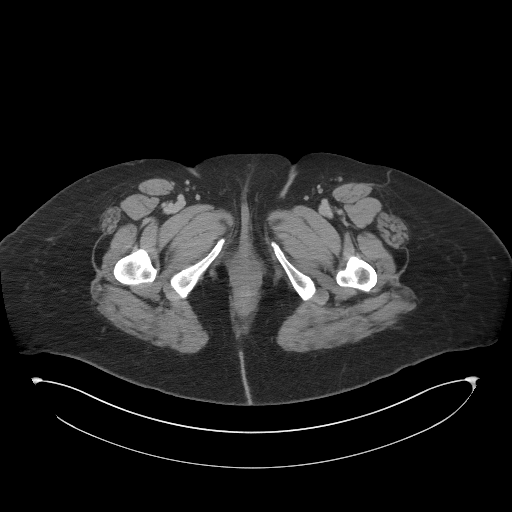
[im 6/95  bone]
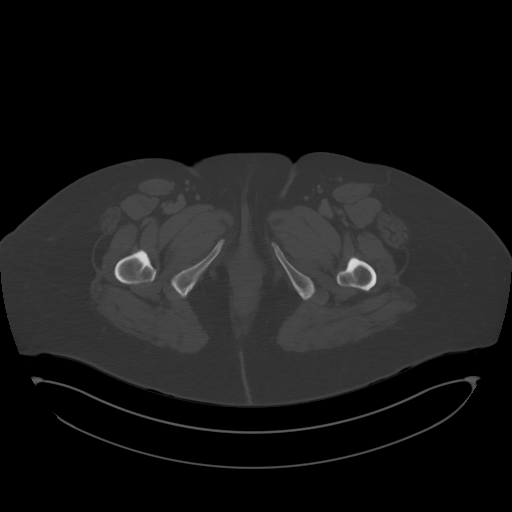
[im 11/95  soft-tissue]
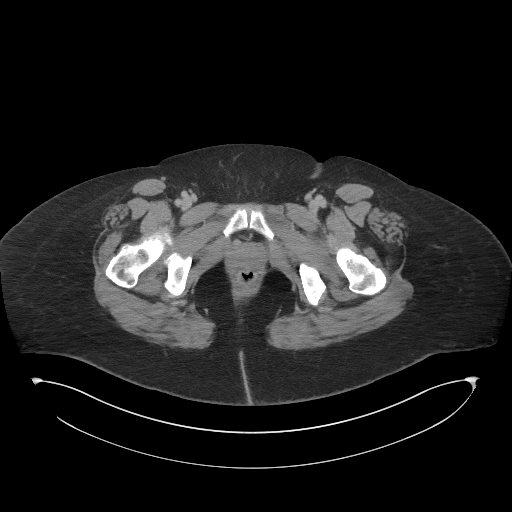
[im 21/95  soft-tissue]
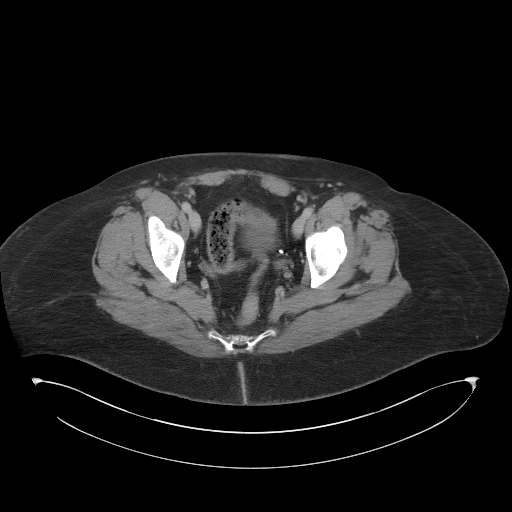
[im 27/95  soft-tissue]
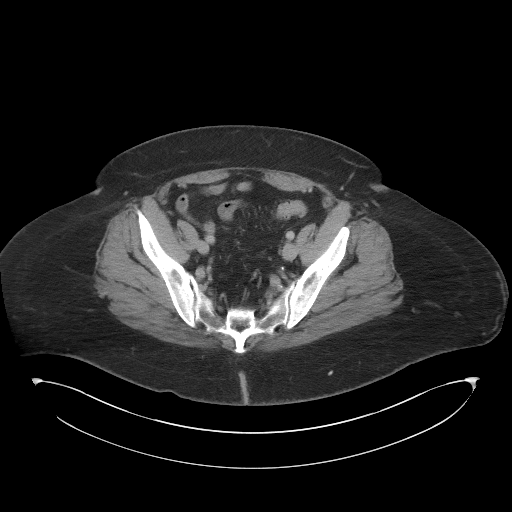
[im 32/95  soft-tissue]
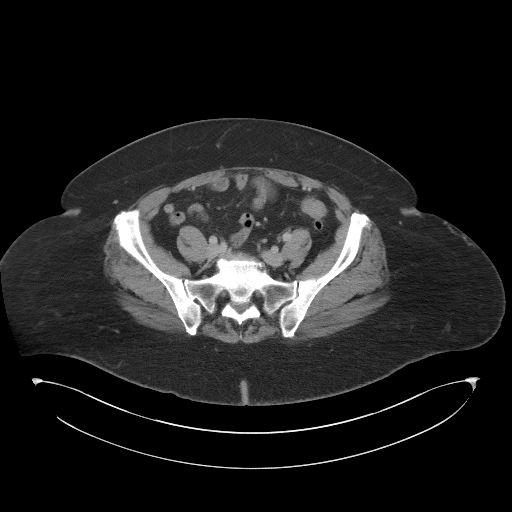
[im 42/95  soft-tissue]
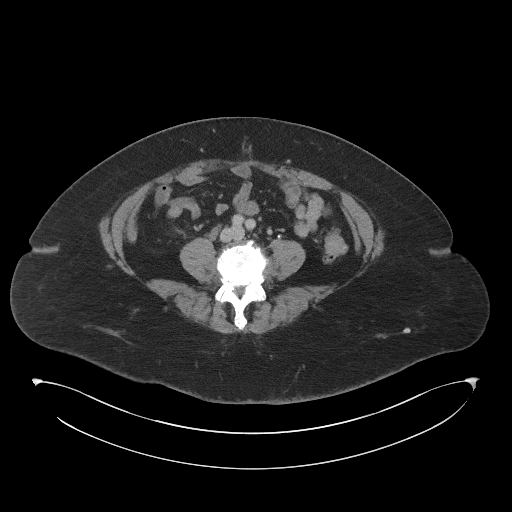
[im 48/95  soft-tissue]
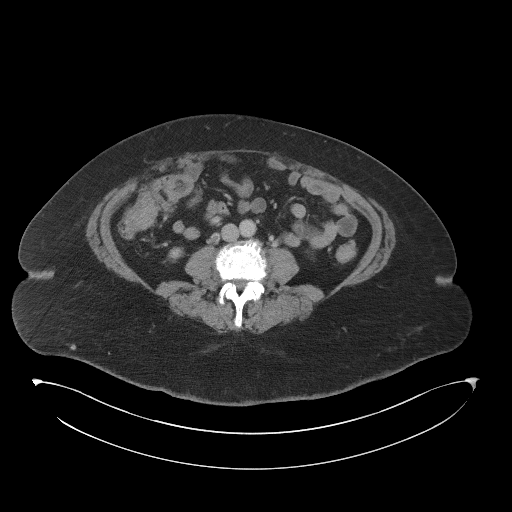
[im 53/95  soft-tissue]
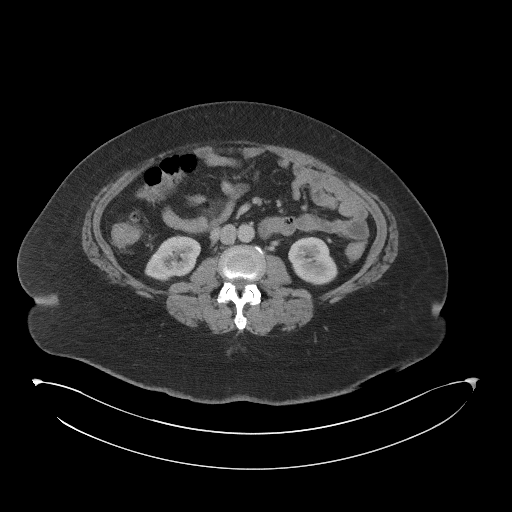
[im 63/95  soft-tissue]
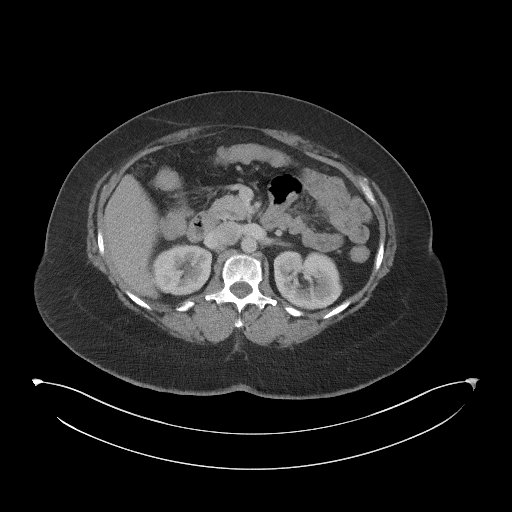
[im 63/95  bone]
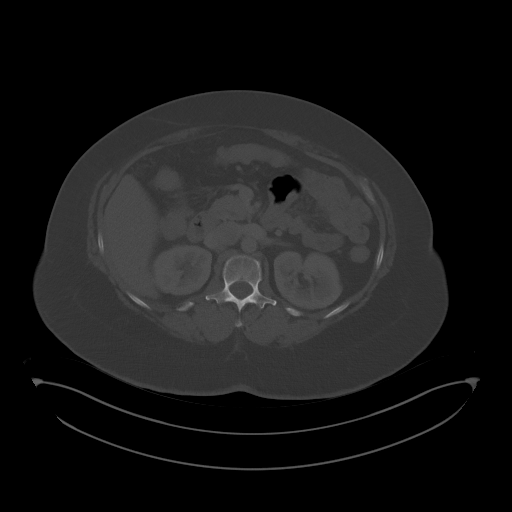
[im 68/95  soft-tissue]
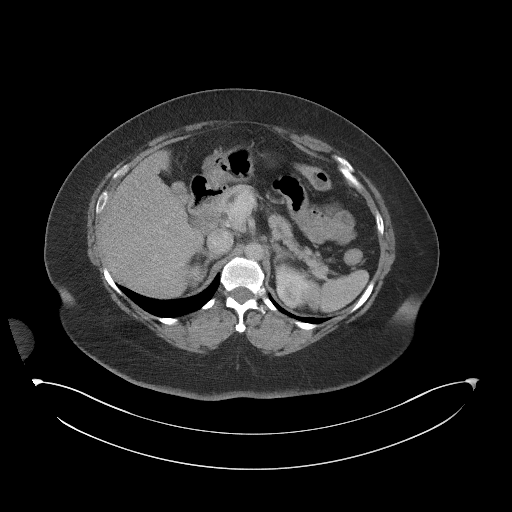
[im 74/95  soft-tissue]
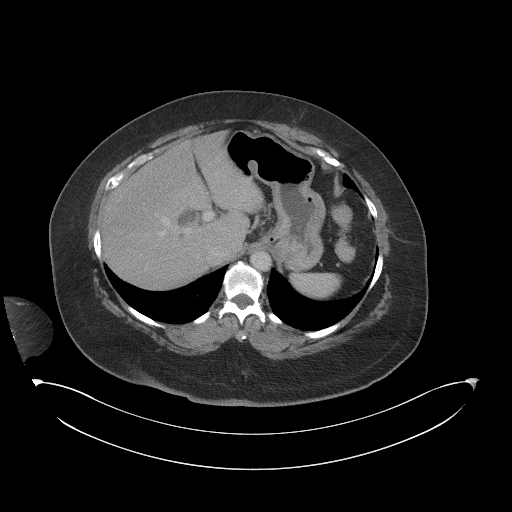
[im 84/95  soft-tissue]
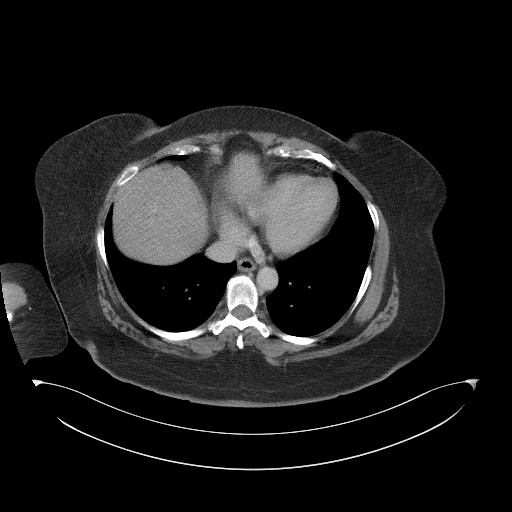
[im 89/95  soft-tissue]
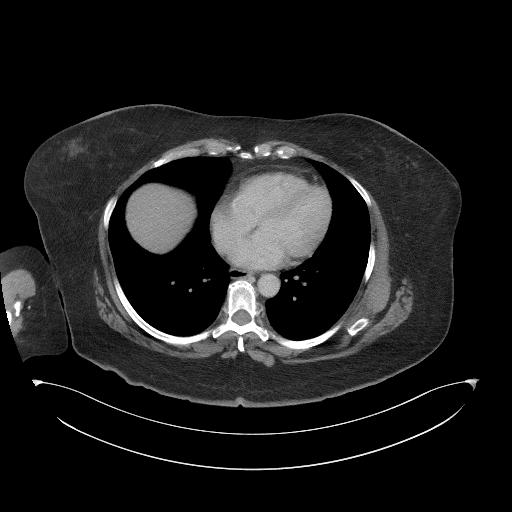

[Series 6: coronal st · coronal · 1.05mm/px · 3 of 114 slices shown]
[im 38/114  soft-tissue]
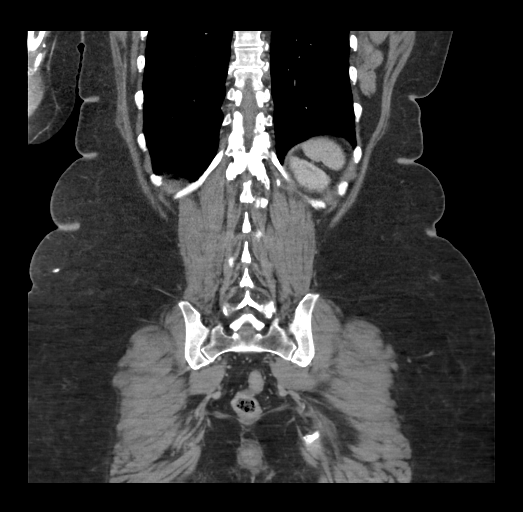
[im 51/114  soft-tissue]
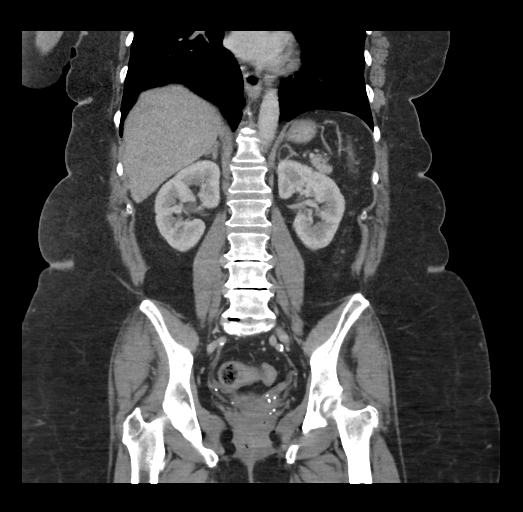
[im 63/114  soft-tissue]
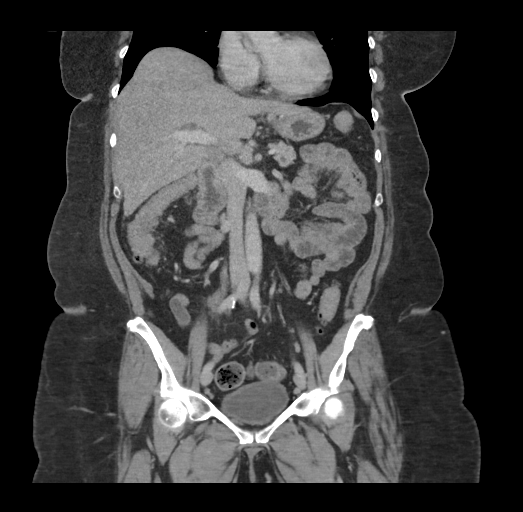

[16 of 46 positions shown; findings below may reference images not displayed]

RADIATION DOSE REDUCTION: This exam was performed according to the
departmental dose-optimization program which includes automated
exposure control, adjustment of the mA and/or kV according to
patient size and/or use of iterative reconstruction technique.

CONTRAST:  100mL OMNIPAQUE IOHEXOL 300 MG/ML  SOLN
FINDINGS: Lower chest: No acute abnormality.

Hepatobiliary: No suspicious liver lesions. Gallbladder is
surgically absent. Unchanged biliary ductal dilation.

Pancreas: Unchanged prominence of the main pancreatic duct. No
surrounding inflammatory change.

Spleen: Normal in size without focal abnormality.

Adrenals/Urinary Tract: Stable 1.0 cm left adrenal myelolipoma.
Right adrenal gland is unremarkable. Kidneys enhance symmetrically
with no evidence of hydronephrosis or nephrolithiasis. Bladder is
unremarkable.

Stomach/Bowel: Stomach is within normal limits. Appendix appears
normal. Diverticulosis. Prominent loop of sigmoid colon with
ahaustral appearance. No bowel wall thickening or inflammatory
changes.

Vascular/Lymphatic: Right ovarian vein thrombus. Mild aortoiliac
atherosclerosis. No enlarged abdominal or pelvic lymph nodes.

Reproductive: Status post hysterectomy. No adnexal masses.

Other: No abdominal wall hernia or abnormality. No abdominopelvic
ascites.

Musculoskeletal: No acute or significant osseous findings.
IMPRESSION: 1. Prominent loop of sigmoid colon with ahaustral appearance,
finding is suggestive of colitis.
2. Diverticulosis with no diverticulitis.
3. Right ovarian vein thrombus, of uncertain clinical significance.
4. Aortic Atherosclerosis ([id number]-V84.4).

## 2022-03-03 IMAGING — CT CT ABD-PELV W/ CM
2 of 5 series · 17 of 46 positions shown, 19 images · IV contrast (Omnipaque)
Comparison: CT abdomen pelvis dated November 27, 2021.

CLINICAL DATA: Left lower quadrant abdominal pain.

EXAM:
CT ABDOMEN AND PELVIS WITH CONTRAST
TECHNIQUE: Multidetector CT imaging of the abdomen and pelvis was performed
using the standard protocol following bolus administration of
intravenous contrast.

[Series 2: axial st · axial · 0.98mm/px · z∈[-443,-8]mm · 14 of 99 slices shown, 16 images]
[im 6/99  soft-tissue]
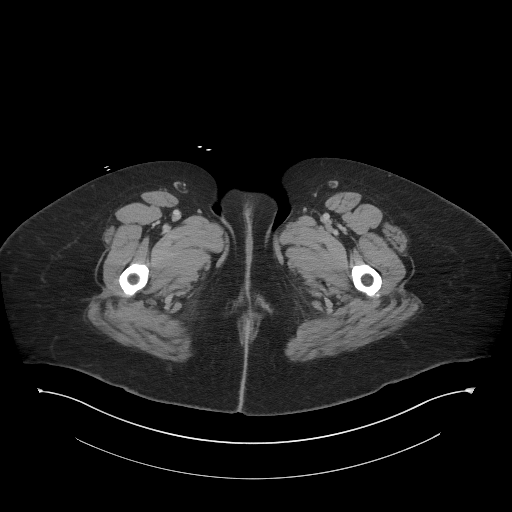
[im 6/99  bone]
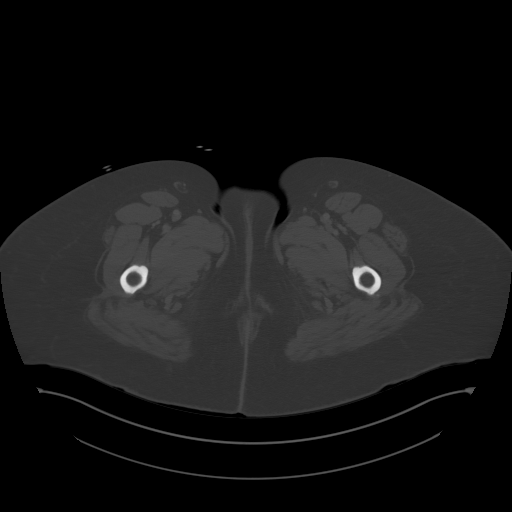
[im 11/99  soft-tissue]
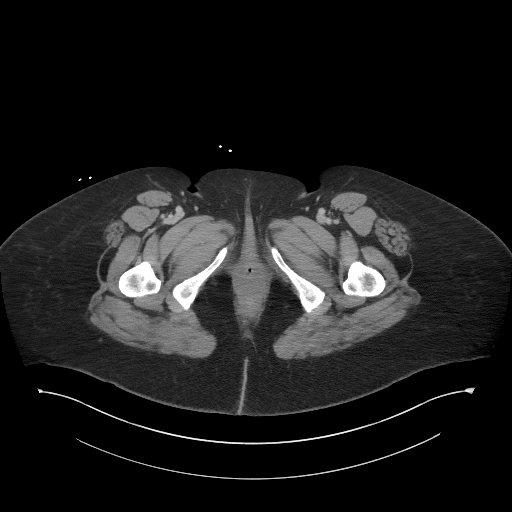
[im 21/99  soft-tissue]
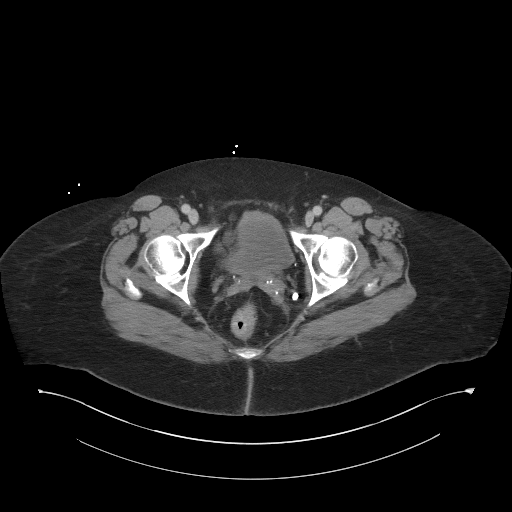
[im 26/99  soft-tissue]
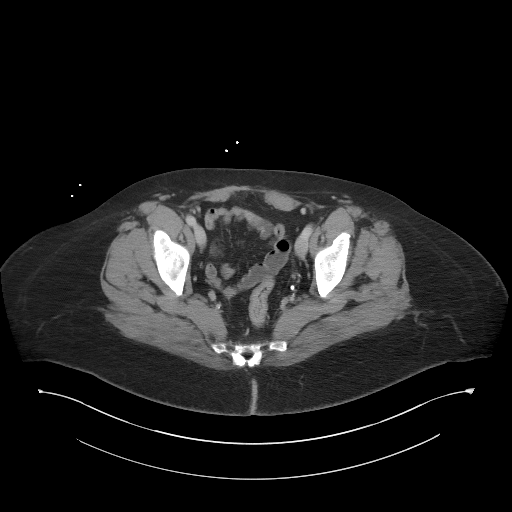
[im 31/99  soft-tissue]
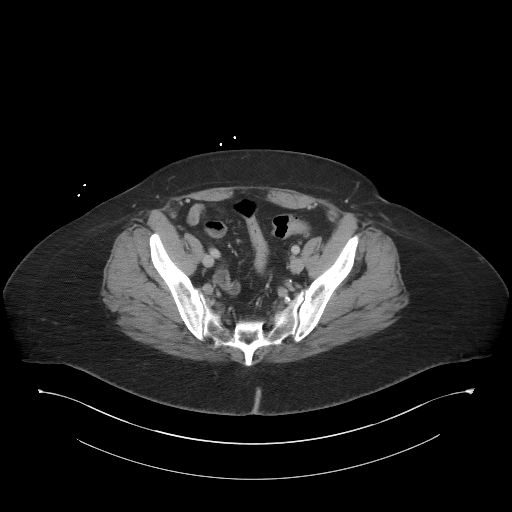
[im 42/99  soft-tissue]
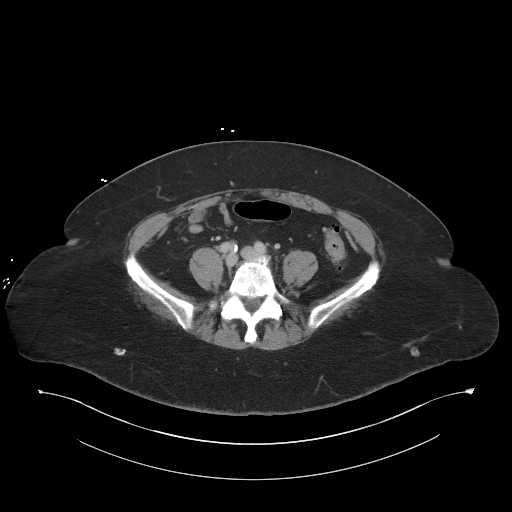
[im 47/99  soft-tissue]
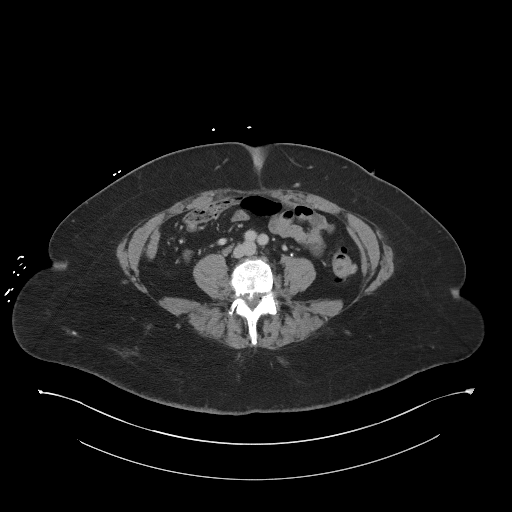
[im 52/99  soft-tissue]
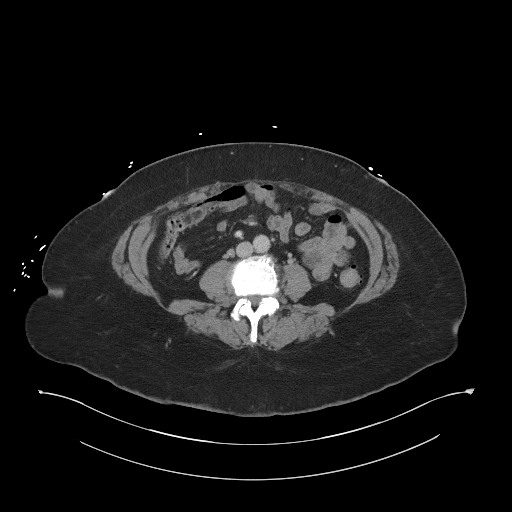
[im 57/99  soft-tissue]
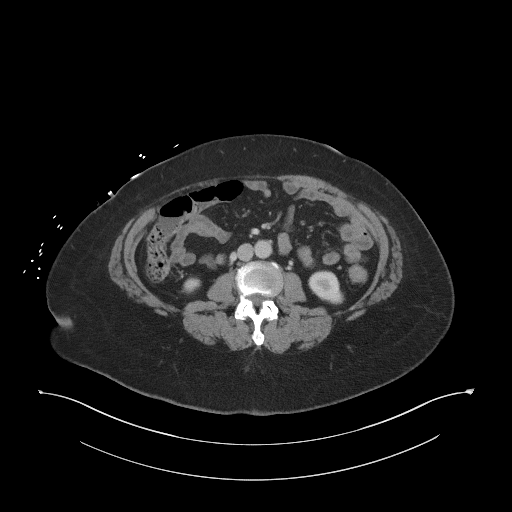
[im 57/99  bone]
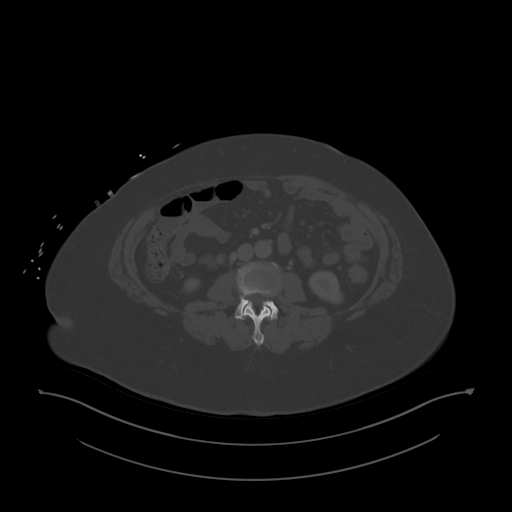
[im 68/99  soft-tissue]
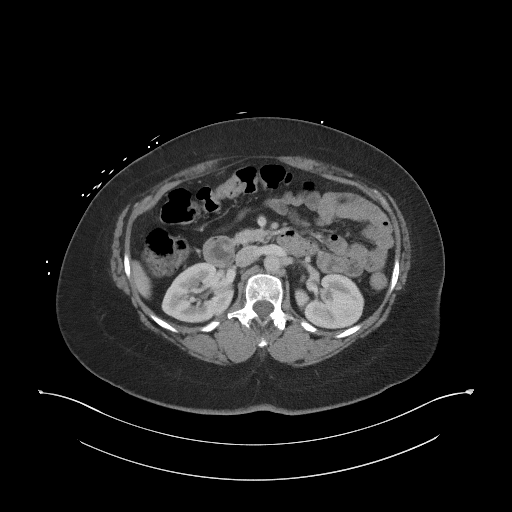
[im 73/99  soft-tissue]
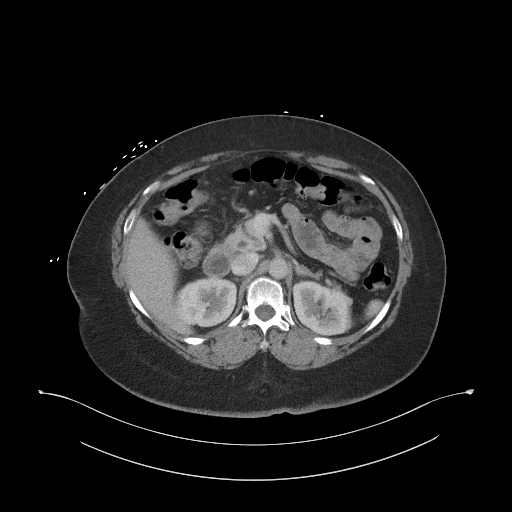
[im 78/99  soft-tissue]
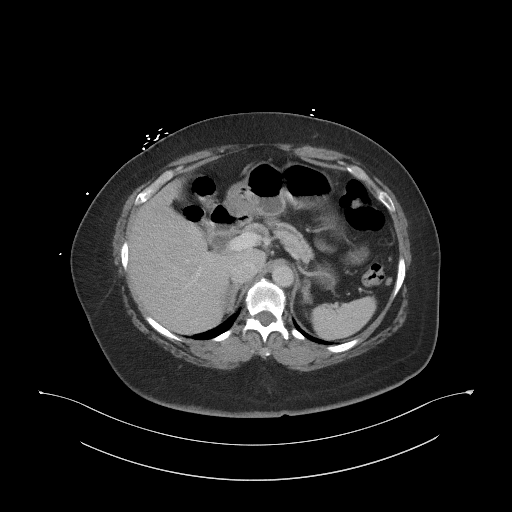
[im 88/99  soft-tissue]
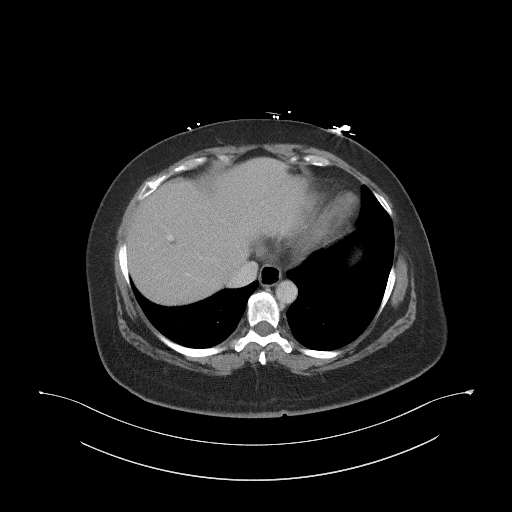
[im 93/99  soft-tissue]
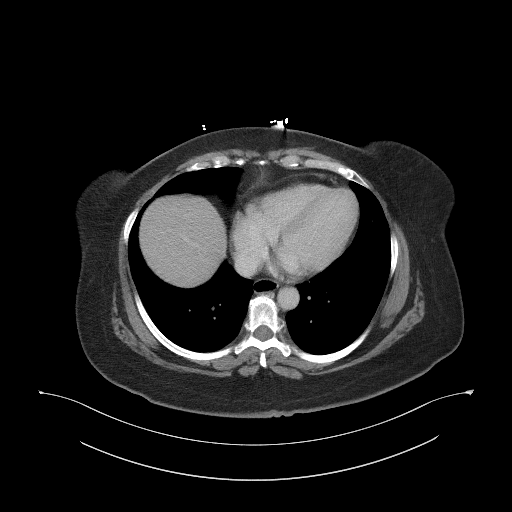

[Series 5: coronal st · coronal · 0.96mm/px · 3 of 100 slices shown]
[im 34/100  soft-tissue]
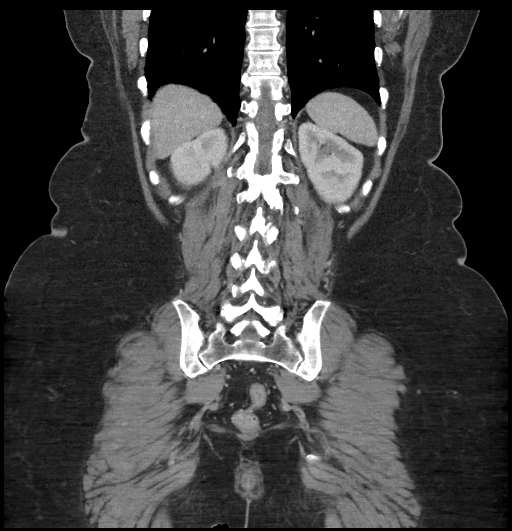
[im 45/100  soft-tissue]
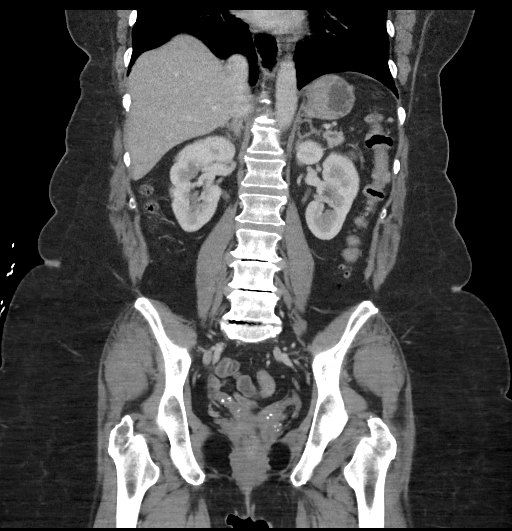
[im 56/100  soft-tissue]
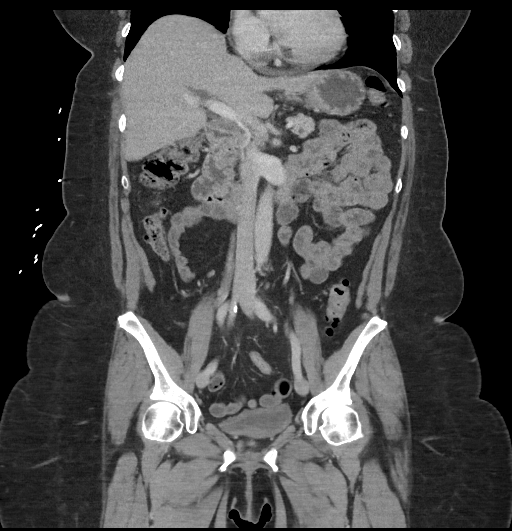

[17 of 46 positions shown; findings below may reference images not displayed]

RADIATION DOSE REDUCTION: This exam was performed according to the
departmental dose-optimization program which includes automated
exposure control, adjustment of the mA and/or kV according to
patient size and/or use of iterative reconstruction technique.

CONTRAST:  100mL OMNIPAQUE IOHEXOL 300 MG/ML  SOLN
FINDINGS: Lower chest: No acute abnormality.

Hepatobiliary: No focal liver abnormality. Status post
cholecystectomy. Unchanged mild intra- and extrahepatic biliary
dilatation due to reservoir effect.

Pancreas: Unchanged prominent main pancreatic duct. No mass or
surrounding inflammatory changes.

Spleen: Normal in size without focal abnormality.

Adrenals/Urinary Tract: Unchanged 1.0 cm left adrenal myelolipoma.
The right adrenal gland is unremarkable. No renal mass, calculi, or
hydronephrosis. Bladder is unremarkable for the degree of
distention.

Stomach/Bowel: Stomach is within normal limits. Appendix appears
normal. No evidence of bowel wall thickening, distention, or
inflammatory changes. Left-sided colonic diverticulosis again noted.

Vascular/Lymphatic: Resolved right ovarian vein thrombus. No
enlarged abdominal or pelvic lymph nodes.

Reproductive: Status post hysterectomy. No adnexal masses.

Other: No abdominal wall hernia or abnormality. No abdominopelvic
ascites. No pneumoperitoneum.

Musculoskeletal: No acute or significant osseous findings.
IMPRESSION: 1. No acute intra-abdominal process.
2. Resolved right ovarian vein thrombus.

## 2022-03-14 ENCOUNTER — Emergency Department (HOSPITAL_BASED_OUTPATIENT_CLINIC_OR_DEPARTMENT_OTHER)
Admission: EM | Admit: 2022-03-14 | Discharge: 2022-03-15 | Disposition: A | Discharge status: Home / Self Care | Payer: Medicare HMO | Attending: Emergency Medicine | Admitting: Emergency Medicine

## 2022-03-14 ENCOUNTER — Emergency Department (HOSPITAL_BASED_OUTPATIENT_CLINIC_OR_DEPARTMENT_OTHER): Payer: Medicare HMO

## 2022-03-14 ENCOUNTER — Other Ambulatory Visit: Payer: Self-pay

## 2022-03-14 ENCOUNTER — Encounter (HOSPITAL_BASED_OUTPATIENT_CLINIC_OR_DEPARTMENT_OTHER): Payer: Self-pay | Admitting: Emergency Medicine

## 2022-03-14 DIAGNOSIS — R000 Tachycardia, unspecified: Secondary | ICD-10-CM | POA: Diagnosis not present

## 2022-03-14 DIAGNOSIS — Z91040 Latex allergy status: Secondary | ICD-10-CM | POA: Insufficient documentation

## 2022-03-14 DIAGNOSIS — R197 Diarrhea, unspecified: Secondary | ICD-10-CM | POA: Insufficient documentation

## 2022-03-14 DIAGNOSIS — R112 Nausea with vomiting, unspecified: Secondary | ICD-10-CM | POA: Diagnosis not present

## 2022-03-14 DIAGNOSIS — Z7901 Long term (current) use of anticoagulants: Secondary | ICD-10-CM | POA: Insufficient documentation

## 2022-03-14 DIAGNOSIS — R1084 Generalized abdominal pain: Secondary | ICD-10-CM | POA: Diagnosis not present

## 2022-03-14 DIAGNOSIS — R0682 Tachypnea, not elsewhere classified: Secondary | ICD-10-CM | POA: Diagnosis not present

## 2022-03-14 DIAGNOSIS — I10 Essential (primary) hypertension: Secondary | ICD-10-CM | POA: Diagnosis not present

## 2022-03-14 DIAGNOSIS — R509 Fever, unspecified: Secondary | ICD-10-CM | POA: Diagnosis not present

## 2022-03-14 DIAGNOSIS — R109 Unspecified abdominal pain: Secondary | ICD-10-CM | POA: Diagnosis not present

## 2022-03-14 LAB — COMPREHENSIVE METABOLIC PANEL
ALT: 25 U/L (ref 0–44)
AST: 21 U/L (ref 15–41)
Albumin: 4.1 g/dL (ref 3.5–5.0)
Alkaline Phosphatase: 119 U/L (ref 38–126)
Anion gap: 10 (ref 5–15)
BUN: 13 mg/dL (ref 8–23)
CO2: 22 mmol/L (ref 22–32)
Calcium: 9.4 mg/dL (ref 8.9–10.3)
Chloride: 104 mmol/L (ref 98–111)
Creatinine, Ser: 0.73 mg/dL (ref 0.44–1.00)
GFR, Estimated: >60 mL/min (ref >60)
Glucose, Bld: 96 mg/dL (ref 70–99)
Potassium: 4.1 mmol/L (ref 3.5–5.1)
Sodium: 136 mmol/L (ref 135–145)
Total Bilirubin: 0.4 mg/dL (ref 0.3–1.2)
Total Protein: 9.3 g/dL — ABNORMAL HIGH (ref 6.5–8.1)

## 2022-03-14 LAB — URINALYSIS, MICROSCOPIC (REFLEX)

## 2022-03-14 LAB — LIPASE, BLOOD: Lipase: 30 U/L (ref 11–51)

## 2022-03-14 LAB — URINALYSIS, ROUTINE W REFLEX MICROSCOPIC
Glucose, UA: NEGATIVE mg/dL
Hgb urine dipstick: NEGATIVE
Ketones, ur: 15 mg/dL — AB
Leukocytes,Ua: NEGATIVE
Nitrite: NEGATIVE
Protein, ur: 100 mg/dL — AB
Specific Gravity, Urine: 1.025 (ref 1.005–1.030)
pH: 6 (ref 5.0–8.0)

## 2022-03-14 LAB — CBC
HCT: 46.4 % — ABNORMAL HIGH (ref 36.0–46.0)
Hemoglobin: 14.7 g/dL (ref 12.0–15.0)
MCH: 26.1 pg (ref 26.0–34.0)
MCHC: 31.7 g/dL (ref 30.0–36.0)
MCV: 82.3 fL (ref 80.0–100.0)
Platelets: 400 10*3/uL (ref 150–400)
RBC: 5.64 MIL/uL — ABNORMAL HIGH (ref 3.87–5.11)
RDW: 15.3 % (ref 11.5–15.5)
WBC: 6.4 10*3/uL (ref 4.0–10.5)
nRBC: 0 % (ref 0.0–0.2)

## 2022-03-14 LAB — LACTIC ACID, PLASMA: Lactic Acid, Venous: 1.9 mmol/L (ref 0.5–1.9)

## 2022-03-14 MED ORDER — IOHEXOL 300 MG/ML  SOLN
100.0000 mL | Freq: Once | INTRAMUSCULAR | Status: AC | PRN
  Administered 2022-03-14: 100 mL via INTRAVENOUS

## 2022-03-14 MED ORDER — SODIUM CHLORIDE 0.9 % IV BOLUS
1000.0000 mL | Freq: Once | INTRAVENOUS | Status: AC
  Administered 2022-03-14: 1000 mL via INTRAVENOUS

## 2022-03-14 MED ORDER — FENTANYL CITRATE PF 50 MCG/ML IJ SOSY
50.0000 ug | PREFILLED_SYRINGE | Freq: Once | INTRAMUSCULAR | Status: AC
  Administered 2022-03-14: 50 ug via INTRAVENOUS
  Filled 2022-03-14: qty 1

## 2022-03-14 MED ORDER — ONDANSETRON HCL 4 MG/2ML IJ SOLN
4.0000 mg | Freq: Once | INTRAMUSCULAR | Status: AC
  Administered 2022-03-14: 4 mg via INTRAVENOUS
  Filled 2022-03-14: qty 2

## 2022-03-14 NOTE — ED Notes (Signed)
Pt to restroom to get urine sample. Pt gave sample then moved without assistance to wheel chair then became unresponsive, had to be aroused by touch. Labs drawn, Iv placed and pt moved to exam room for evaluation.

## 2022-03-14 NOTE — ED Notes (Signed)
Patient transported to CT 

## 2022-03-14 NOTE — ED Triage Notes (Signed)
Pt has Hx of colitis and as been on new antibiotic, fell last week havnig pelvic pain, with nausea and diarrhea

## 2022-03-14 NOTE — ED Provider Notes (Signed)
[name] Provider Note   CSN: [id number] Arrival date & time: 03/14/22  2028     History  Chief Complaint  Patient presents with   Abdominal Pain   Hip Pain    Erin Wolf is a 66 y.o. female.  The history is provided by the patient and medical records. No language interpreter was used.  Abdominal Pain Pain location:  Generalized Pain quality: aching and sharp   Pain radiates to:  Does not radiate Pain severity:  Severe Onset quality:  Gradual Duration:  2 days Timing:  Constant Progression:  Waxing and waning Chronicity:  Recurrent Context comment:  Recent augmentin use Relieved by:  Nothing Worsened by:  Palpation and movement Ineffective treatments:  None tried Associated symptoms: chills, diarrhea, fatigue, hematochezia, nausea and vomiting   Associated symptoms: no chest pain, no constipation, no cough, no dysuria, no fever, no hematuria, no shortness of breath, no vaginal bleeding and no vaginal discharge   Hip Pain Associated symptoms include abdominal pain. Pertinent negatives include no chest pain, no headaches and no shortness of breath.      Home Medications Prior to Admission medications   Medication Sig Start Date End Date Taking? Authorizing Provider  amoxicillin-clavulanate (AUGMENTIN) 875-125 MG tablet Take 1 tablet by mouth every 12 (twelve) hours. 11/27/21   Hayden Rasmussen, MD  Chlorphen-Pseudoephed-APAP [facility] FLU/COLD PO) Take 1 packet by mouth daily as needed (cold symptoms).    [provider]  cyclobenzaprine (FLEXERIL) 10 MG tablet Take 10 mg by mouth as needed for muscle spasms. 03/30/20   [provider]  docusate sodium (COLACE) 100 MG capsule Take 1 capsule (100 mg total) by mouth 2 (two) times daily. Patient not taking: No sig reported 12/20/20   Danae Orleans, PA-C  esomeprazole (NEXIUM) 40 MG capsule Take 40 mg by mouth daily as needed (acid reflux). 06/10/19   [provider]  FLUoxetine (PROZAC) 20 MG capsule Take 20 mg by mouth daily.     [provider]  furosemide (LASIX) 20 MG tablet Take 20 mg by mouth daily as needed for edema.    [provider]  Multiple Vitamins-Minerals (WOMENS MULTI PO) Take 1 tablet by mouth daily.    [provider]  naloxone [facility]) nasal spray 4 mg/0.1 mL Place 1 spray into the nose daily as needed (opioid overdose).    [provider]  ondansetron (ZOFRAN-ODT) 8 MG disintegrating tablet Take 1 tablet (8 mg total) by mouth every 8 (eight) hours as needed for nausea or vomiting. 12/19/21   Sherrill Raring, PA-C  Oxycodone HCl 10 MG TABS Take 2 tablets (20 mg total) by mouth every 4 (four) hours as needed. 03/28/21   Irving Copas, PA-C  Polyethyl Glycol-Propyl Glycol (SYSTANE OP) Place 1 drop into both eyes 2 (two) times daily as needed (dry eyes).    [provider]  polyethylene glycol (MIRALAX / GLYCOLAX) 17 g packet Take 17 g by mouth 2 (two) times daily. Patient not taking: No sig reported 12/20/20   Danae Orleans, PA-C  potassium chloride SA (KLOR-CON M) 20 MEQ tablet Take 1 tablet (20 mEq total) by mouth 2 (two) times daily. 12/19/21   Sherrill Raring, PA-C  rivaroxaban (XARELTO) 10 MG TABS tablet Take 1 tablet (10 mg total) by mouth daily with breakfast for 20 days. 03/29/21 04/18/21  Irving Copas, PA-C  rosuvastatin (CRESTOR) 10 MG tablet Take 10 mg by mouth every Monday.    [provider]  triamterene-hydrochlorothiazide (MAXZIDE) 75-50 MG tablet Take 1 tablet by mouth every morning. 03/30/20   [provider]      Allergies    Latex, Nsaids, Wellbutrin [bupropion hcl], Aspirin, Darvocet [propoxyphene n-acetaminophen], Tape, Toradol [ketorolac tromethamine], Tylenol [acetaminophen], Butalbital-apap-caffeine, Ciprofloxacin, Eszopiclone, Milk-related compounds, Other, and Topiramate    Review of Systems   Review of Systems  Constitutional:  Positive for  chills and fatigue. Negative for diaphoresis and fever.  HENT:  Negative for congestion.   Eyes:  Negative for visual disturbance.  Respiratory:  Negative for cough, chest tightness, shortness of breath and wheezing.   Cardiovascular:  Negative for chest pain, palpitations and leg swelling.  Gastrointestinal:  Positive for abdominal pain, diarrhea, hematochezia, nausea and vomiting. Negative for constipation.  Genitourinary:  Positive for flank pain. Negative for dysuria, hematuria, pelvic pain, vaginal bleeding, vaginal discharge and vaginal pain.  Musculoskeletal:  Positive for back pain. Negative for neck pain and neck stiffness.  Skin:  Positive for wound (left axilla ulceration). Negative for rash.  Neurological:  Negative for light-headedness and headaches.  Psychiatric/Behavioral:  Negative for agitation.   All other systems reviewed and are negative.  Physical Exam Updated Vital Signs BP (!) 143/99   Pulse 92   Temp (S) 99.7 F (37.6 C) (Rectal)   Resp (!) 23   Ht '5\' 5"'$  (1.651 m)   Wt 99.8 kg   SpO2 97%   BMI 36.61 kg/m  Physical Exam Vitals and nursing note reviewed.  Constitutional:      General: She is not in acute distress.    Appearance: She is well-developed. She is ill-appearing. She is not toxic-appearing or diaphoretic.  HENT:     Head: Normocephalic and atraumatic.     Nose: No congestion or rhinorrhea.     Mouth/Throat:     Mouth: Mucous membranes are dry.     Pharynx: No oropharyngeal exudate or posterior oropharyngeal erythema.  Eyes:     Extraocular Movements: Extraocular movements intact.     Conjunctiva/sclera: Conjunctivae normal.     Pupils: Pupils are equal, round, and reactive to light.  Cardiovascular:     Rate and Rhythm: Normal rate and regular rhythm.     Heart sounds: No murmur heard. Pulmonary:     Effort: Pulmonary effort is normal. No respiratory distress.     Breath sounds: Normal breath sounds. No wheezing, rhonchi or rales.  Chest:      Chest wall: No tenderness.  Abdominal:     General: Abdomen is flat.     Palpations: Abdomen is soft.     Tenderness: There is abdominal tenderness. There is no right CVA tenderness or left CVA tenderness.  Musculoskeletal:        General: No swelling or tenderness.     Cervical back: Neck supple. No tenderness.     Right lower leg: No edema.     Left lower leg: No edema.  Skin:    General: Skin is warm and dry.     Capillary Refill: Capillary refill takes less than 2 seconds.     Findings: No erythema.  Neurological:     General: No focal deficit present.     Mental Status: She is alert.     Sensory: No sensory deficit.     Motor: No weakness.  Psychiatric:        Mood and Affect: Mood normal.    ED Results / Procedures / Treatments   Labs (all labs ordered are listed,  but only abnormal results are displayed) Labs Reviewed  CBC - Abnormal; Notable for the following components:      Result Value   RBC 5.64 (*)    HCT 46.4 (*)    All other components within normal limits  URINALYSIS, ROUTINE W REFLEX MICROSCOPIC - Abnormal; Notable for the following components:   APPearance CLOUDY (*)    Bilirubin Urine SMALL (*)    Ketones, ur 15 (*)    Protein, ur 100 (*)    All other components within normal limits  URINALYSIS, MICROSCOPIC (REFLEX) - Abnormal; Notable for the following components:   Bacteria, UA MANY (*)    All other components within normal limits  COMPREHENSIVE METABOLIC PANEL - Abnormal; Notable for the following components:   Total Protein 9.3 (*)    All other components within normal limits  CULTURE, BLOOD (ROUTINE X 2)  CULTURE, BLOOD (ROUTINE X 2)  LACTIC ACID, PLASMA  LIPASE, BLOOD    EKG EKG Interpretation  Date/Time:  Thursday Mar 14 2022 21:09:34 EDT Ventricular Rate:  117 PR Interval:  158 QRS Duration: 68 QT Interval:  302 QTC Calculation: 421 R Axis:   34 Text Interpretation: Sinus tachycardia Low voltage QRS Borderline ECG When  compared with ECG of 19-Dec-2021 08:38, PREVIOUS ECG IS PRESENT when compared to prior, faster rate. No STEMI Confirmed by Antony Blackbird [phone]) on 03/14/2022 9:17:27 PM  Radiology DG Chest Portable 1 View  Result Date: 03/14/2022 CLINICAL DATA:  Fever, tachycardia, tachypnea EXAM: PORTABLE CHEST 1 VIEW COMPARISON:  07/13/2019 FINDINGS: The heart size and mediastinal contours are within normal limits. Both lungs are clear. The visualized skeletal structures are unremarkable. IMPRESSION: No active disease. Electronically Signed   By: Fidela Salisbury M.D.   On: 03/14/2022 22:47    Procedures Procedures    Medications Ordered in ED Medications  sodium chloride 0.9 % bolus 1,000 mL (1,000 mLs Intravenous New Bag/Given 03/14/22 2300)  fentaNYL (SUBLIMAZE) injection 50 mcg (50 mcg Intravenous Given 03/14/22 2245)  ondansetron (ZOFRAN) injection 4 mg (4 mg Intravenous Given 03/14/22 2244)  iohexol (OMNIPAQUE) 300 MG/ML solution 100 mL (100 mLs Intravenous Contrast Given 03/14/22 2324)  oxyCODONE-acetaminophen (PERCOCET/ROXICET) 5-325 MG per tablet 1 tablet (1 tablet Oral Given 03/15/22 0011)    ED Course/ Medical Decision Making/ A&P                           Medical Decision Making Amount and/or Complexity of Data Reviewed Labs: ordered. Radiology: ordered.  Risk Prescription drug management.    PAMMIE CHIRINO is a 66 y.o. female with a complex past medical history significant for lupus, ulcerative colitis, irritable bowel syndrome, hypertension, hyperlipidemia, anxiety, fibromyalgia, seizures, hepatitis C, fibroids, arthritis, and history of falls who presents with severe abdominal pain.  According to patient, she has flareups of her severe abdominal pain when other things are going on her body typically.  She describes that several days ago she had a fever of 102.  She reports that since yesterday she has had severe pain all over her abdomen with associated nausea, vomiting, and diarrhea.   She is also had some intermittent bloody stools that are similar to prior.  She is still passing gas.  She denies any urinary symptoms.  Denies any congestion, cough, chest pain, or shortness of breath.  She reports that she had a boil on her left axilla recently and is on Augmentin.  It had drained and is improving however  now she has had all the abdominal symptoms.  She denies any pelvic symptoms but does report pain across her lower abdomen after having a fall 2 weeks ago.  She reports she frequently has falls.  She denies any headache or neck pain or neck stiffness.  She is concerned about her abdomen.  On exam, lungs clear and chest nontender.  Abdomen is diffusely tender as is her back and flanks.  She did not have tenderness on her hips but had lower abdominal tenderness.  She denies any vaginal or pelvic symptoms otherwise.  She did have good bowel sounds.  Patient had a small wound on her left axilla that was nontender and nonfluctuant.  No erythema or warmth around it.  This is where she recently had her cellulitis.  Intact grip strength, sensation, strength, and pulses in upper extremities.  No tenderness in lower extremities.  Patient diffusely having abdominal pain.  Clinically I suspect that patient took the Augmentin for the cellulitis/mild draining abscess recently and that stirred up her chronic abdominal troubles.  With the amount of abdominal pain, tachycardia, tachypnea, and subjective chills with her history of ulcerative colitis and the amount of discomfort she is in, I do feel we need to get a CT scan.  We will get labs, urinalysis, chest x-ray to rule out concerning etiology of symptoms.  We will give her pain medicine, nausea medicine, and fluids.  Patient does report she had a fall a week or 2 ago and was having some lower pelvis pains so the CT will also look for traumatic injury.   Anticipate reassessment after work-up to determine disposition.   CT scan returned showing no  acute abnormality.  No focal findings seen.  We discussed all the findings together.  Her work-up otherwise returned reassuring which we discussed with her.  I suspect the diarrhea is from the Augmentin and as she reports her axial symptoms have resolved, we will have her stop that antibiotic and escalate her pain regimen at home.  She is already feeling better.  Her urine showed some ketones likely suspicious for some dehydration.  She had improvement in heart rate after fluids and is feeling better.  Patient will follow-up with PCP and GI team and understood return precautions.  She had no other questions or concerns and was discharged in good condition.          Final Clinical Impression(s) / ED Diagnoses Final diagnoses:  Nausea vomiting and diarrhea  Generalized abdominal pain     Clinical Impression: 1. Nausea vomiting and diarrhea   2. Generalized abdominal pain     Disposition: Care transferred to oncoming team to await work-up results including CT scan and reassessment.  If work-up is reassuring, suspect GI symptoms are related to the recent Augmentin use leading to diarrhea and aggravation of her chronic problems.  This note was prepared with assistance of Systems analyst. Occasional wrong-word or sound-a-like substitutions may have occurred due to the inherent limitations of voice recognition software.     Deshan Hemmelgarn, Gwenyth Allegra, MD 03/15/22 [phone]

## 2022-03-15 DIAGNOSIS — R1084 Generalized abdominal pain: Secondary | ICD-10-CM | POA: Diagnosis not present

## 2022-03-15 MED ORDER — OXYCODONE-ACETAMINOPHEN 5-325 MG PO TABS: 1.0000 | ORAL_TABLET | Freq: Four times a day (QID) | ORAL | 0 refills | Status: DC | PRN

## 2022-03-15 MED ORDER — OXYCODONE-ACETAMINOPHEN 5-325 MG PO TABS
1.0000 | ORAL_TABLET | Freq: Once | ORAL | Status: AC
  Administered 2022-03-15: 1 via ORAL
  Filled 2022-03-15: qty 1

## 2022-03-15 NOTE — Discharge Instructions (Signed)
Your work-up today was overall reassuring as we discussed.  The CT scan did not show acute abnormality compared to prior.  Your labs were overall reassuring.  Clinically I suspect the diarrhea was from the Augmentin you have been taking and that aggravated your chronic abdominal pains.  Please escalate the pain regimen as we discussed for the next few days and follow-up with your GI team.  As your infection seems to have cleared in the axilla, we together agreed to have you stop that antibiotic and rest and stay hydrated.  If any symptoms change or worsen acutely, please return to the nearest emergency department.

## 2022-03-15 NOTE — ED Notes (Signed)
Pt verbalized understanding of d/c instructions, prescription and follow up care. Pt reports her husband will be driving her home. Pt wheeled out of ED via wheelchair. She states she is feeling better.

## 2022-03-15 NOTE — ED Notes (Signed)
Pt given crackers and ginger ale per PO challenge.

## 2022-03-20 LAB — CULTURE, BLOOD (ROUTINE X 2)
Culture: NO GROWTH
Culture: NO GROWTH
Special Requests: ADEQUATE
Special Requests: ADEQUATE

## 2022-03-27 DIAGNOSIS — K5900 Constipation, unspecified: Secondary | ICD-10-CM | POA: Diagnosis not present

## 2022-03-27 DIAGNOSIS — K625 Hemorrhage of anus and rectum: Secondary | ICD-10-CM | POA: Diagnosis not present

## 2022-03-27 DIAGNOSIS — R569 Unspecified convulsions: Secondary | ICD-10-CM | POA: Diagnosis not present

## 2022-03-27 DIAGNOSIS — K5190 Ulcerative colitis, unspecified, without complications: Secondary | ICD-10-CM | POA: Diagnosis not present

## 2022-03-27 DIAGNOSIS — R195 Other fecal abnormalities: Secondary | ICD-10-CM | POA: Diagnosis not present

## 2022-03-27 DIAGNOSIS — R1084 Generalized abdominal pain: Secondary | ICD-10-CM | POA: Diagnosis not present

## 2022-04-15 ENCOUNTER — Encounter: Payer: Self-pay | Admitting: Neurology

## 2022-04-15 ENCOUNTER — Ambulatory Visit (INDEPENDENT_AMBULATORY_CARE_PROVIDER_SITE_OTHER): Payer: Medicare HMO | Admitting: Neurology

## 2022-04-15 VITALS — BP 113/85 | HR 73 | Ht 65.0 in | Wt 230.0 lb

## 2022-04-15 DIAGNOSIS — G43709 Chronic migraine without aura, not intractable, without status migrainosus: Secondary | ICD-10-CM

## 2022-04-15 DIAGNOSIS — R7989 Other specified abnormal findings of blood chemistry: Secondary | ICD-10-CM | POA: Diagnosis not present

## 2022-04-15 DIAGNOSIS — R7982 Elevated C-reactive protein (CRP): Secondary | ICD-10-CM | POA: Diagnosis not present

## 2022-04-15 DIAGNOSIS — R700 Elevated erythrocyte sedimentation rate: Secondary | ICD-10-CM | POA: Diagnosis not present

## 2022-04-15 DIAGNOSIS — F5104 Psychophysiologic insomnia: Secondary | ICD-10-CM

## 2022-04-15 DIAGNOSIS — R799 Abnormal finding of blood chemistry, unspecified: Secondary | ICD-10-CM | POA: Diagnosis not present

## 2022-04-15 MED ORDER — NORTRIPTYLINE HCL 10 MG PO CAPS: 30.0000 mg | ORAL_CAPSULE | Freq: Every day | ORAL | 11 refills | Status: DC

## 2022-04-15 MED ORDER — SUMATRIPTAN SUCCINATE 50 MG PO TABS: 50.0000 mg | ORAL_TABLET | ORAL | 6 refills | Status: DC | PRN

## 2022-04-15 NOTE — Progress Notes (Addendum)
Chief Complaint  Patient presents with   New Patient (Initial Visit)    Erin Wolf LV 2020/[facility]olyn Rua MD [phone]/syncope, head injury, dysarthria, hx of seizures      ASSESSMENT AND PLAN  Erin Wolf is a 66 y.o. female    Worsening headache in the setting of excessive stress,  Her headache has migrainous features,  Add on nortriptyline 10 mg titrating to 30 mg every night as preventive medication, keep current dose of Prozac 20 mg every morning  Imitrex 50 mg as needed for abortive treatment  ESR C-reactive protein to rule out temporal arteritis She reported a history of seizure-like event,  Was temporarily treated with antiepileptic medications, has been off medicine for many years, no recurrent seizure,  I see no contraindication for her to proceed with  elective colonoscopy  DIAGNOSTIC DATA (LABS, IMAGING, TESTING) - I reviewed patient records, labs, notes, testing and imaging myself where available. Laboratory evaluations July 2022, A1c was 6.6,  Patient was seen by [facility] neurosurgery at [facility] on May 24, 2022 for temporal artery biopsy  MEDICAL HISTORY:  Erin Wolf is a 66 year old female, seen in request by her primary care physician Dr. Shirline Frees for evaluation of worsening headache.  I reviewed and summarized the referring note. PMHX. Depression anxiety,  HLD Ulcerative colitis Chronic migraine  I saw her previously in 2020 for headache, she had a history of headaches since age 64s, intermittent, average 2 to 3 headaches in a month, typical headache of bilateral frontal temporal region severe pounding headache with light noise sensitivity, nauseous, lasting few hours to 1 day, NSAIDs only provide partial help, because of her history of ulcerative colitis, sometimes she take oxycodone as needed for headache  She also reported a history of seizure, previously was given Keppra Exar 500 mg  every night for seizure control, but since her last recurrent seizure like activity was before 2020, she is no longer on anaphylactic medications  Last seizure-like activity was in 2020, husband heard she fell down, found her on the floor, patient has no recollection of the event, per husband, there was seizure-like movement  She complains of increased stress recently, difficulty sleeping, mind racing, since March 2023, she has headache and on and off, almost daily basis, behind her eyes on both side, headache could last for few hours, she take frequent Tylenol, about 2-3 times,  Personally reviewed MRI of the brain without contrast January 2023 no acute abnormality  CT abdomen 4 evaluation of acute abdominal pain showed colonic diverticulosis without evidence of acute diverticulitis, stable left adrenal myelolipoma, no acute localizing process noted  Laboratory evaluation showed normal CBC, CMP showed mildly decreased potassium 3.0, elevated glucose 125   PHYSICAL EXAM:   Vitals:   04/15/22 1507  BP: 133/85  Pulse: 73  Weight: 230 lb (104.3 kg)  Height: 5' 5"  (1.651 m)   Not recorded     Body mass index is 38.27 kg/m.  PHYSICAL EXAMNIATION:  Gen: NAD, conversant, well nourised, well groomed                     Cardiovascular: Regular rate rhythm, no peripheral edema, warm, nontender. Eyes: Conjunctivae clear without exudates or hemorrhage Neck: Supple, no carotid bruits. Pulmonary: Clear to auscultation bilaterally   NEUROLOGICAL EXAM:  MENTAL STATUS: Speech/cognition: Awake, alert, oriented to history taking and casual conversation CRANIAL NERVES: CN II: Visual fields are full to confrontation. Pupils are round  equal and briskly reactive to light. CN III, IV, VI: extraocular movement are normal. No ptosis. CN V: Facial sensation is intact to light touch CN VII: Face is symmetric with normal eye closure  CN VIII: Hearing is normal to causal conversation. CN IX, X:  Phonation is normal. CN XI: Head turning and shoulder shrug are intact  MOTOR: There is no pronator drift of out-stretched arms. Muscle bulk and tone are normal. Muscle strength is normal.  REFLEXES: Reflexes are 2+ and symmetric at the biceps, triceps, absent knees, and ankles. Plantar responses are flexor.  SENSORY: Intact to light touch, pinprick and vibratory sensation are intact in fingers and toes.  COORDINATION: There is no trunk or limb dysmetria noted.  GAIT/STANCE: Need to push-up To get up from seated position, antalgic,  REVIEW OF SYSTEMS:  Full 14 system review of systems performed and notable only for as above All other review of systems were negative.   ALLERGIES: Allergies  Allergen Reactions   Latex Other (See Comments)    Peels skin   Nsaids Other (See Comments)    HAS COLITIS FLARES   Wellbutrin [Bupropion Hcl] Other (See Comments)    Insomnia and headaches   Aspirin Nausea Only   Darvocet [Propoxyphene N-Acetaminophen] Nausea And Vomiting   Tape Hives   Toradol [Ketorolac Tromethamine] Nausea And Vomiting   Tylenol [Acetaminophen] Nausea Only   Butalbital-Apap-Caffeine     stomach upset   Ciprofloxacin     stomach upset   Eszopiclone Swelling   Milk-Related Compounds Diarrhea   Other     Spicy foods cause diarrhea due to colitis   Topiramate     stomach upset    HOME MEDICATIONS: Current Outpatient Medications  Medication Sig Dispense Refill   cyclobenzaprine (FLEXERIL) 10 MG tablet Take 10 mg by mouth 3 (three) times daily as needed for muscle spasms.     docusate sodium (COLACE) 100 MG capsule Take 1 capsule (100 mg total) by mouth 2 (two) times daily. 28 capsule 0   esomeprazole (NEXIUM) 40 MG capsule Take 40 mg by mouth daily as needed (acid reflux).     FLUoxetine (PROZAC) 20 MG capsule Take 20 mg by mouth daily.      furosemide (LASIX) 20 MG tablet Take 20 mg by mouth daily as needed for edema.     Multiple Vitamins-Minerals (WOMENS  MULTI PO) Take 1 tablet by mouth daily.     naloxone (NARCAN) nasal spray 4 mg/0.1 mL Place 1 spray into the nose daily as needed (opioid overdose).     ondansetron (ZOFRAN-ODT) 8 MG disintegrating tablet Take 1 tablet (8 mg total) by mouth every 8 (eight) hours as needed for nausea or vomiting. 20 tablet 0   Oxycodone HCl 10 MG TABS Take 2 tablets (20 mg total) by mouth every 4 (four) hours as needed.     Polyethyl Glycol-Propyl Glycol (SYSTANE OP) Place 1 drop into both eyes 2 (two) times daily as needed (dry eyes).     polyethylene glycol (MIRALAX / GLYCOLAX) 17 g packet Take 17 g by mouth 2 (two) times daily. 28 packet 0   rosuvastatin (CRESTOR) 10 MG tablet Take 10 mg by mouth every Monday.     No current facility-administered medications for this visit.    PAST MEDICAL HISTORY: Past Medical History:  Diagnosis Date   Anxiety    Arthritis    Chronic abdominal pain    Chronic lower back pain    Chronic nausea    Depression  Diabetes mellitus without complication (HCC)    Diverticulosis    Fibromyalgia    Frequency of urination    GERD (gastroesophageal reflux disease)    Headache    Hematuria    Hepatitis    Hepatitis C antibody positive in blood    per pt told by pcp   History of panic attacks    History of syncope    hx recurrent syncope --- per epic domentation non-cardiac , orthostatic hypotension, anxiety, dehydration   History of uterine fibroid    HTN (hypertension), benign    Hyperlipidemia 01/03/2012   IBS (irritable bowel syndrome)    Myalgia    Seizure disorder ([place]) followed by pcp until new neurologist appr. in jan 2019   "started having them in my 20's" ,  petit mal ----  per pt last seizure one 2015   SLE (systemic lupus erythematosus) [facility])    rheumatologist-- dr Gerilyn Nestle (consult 11-12-2016) having work-up done   Ulcerative colitis    followed by dr Penelope Coop at [facility]   Wears glasses     PAST SURGICAL HISTORY: Past Surgical History:  Procedure  Laterality Date   [place]   COLONOSCOPY N/A 05/31/2013   Procedure: COLONOSCOPY;  Surgeon: Wonda Horner, MD;  Location: [facility] ENDOSCOPY;  Service: Endoscopy;  Laterality: N/A;   CYSTOSCOPY/RETROGRADE/URETEROSCOPY Bilateral 07/23/2017   Procedure: CYSTOSCOPY/RETROGRADE/URETEROSCOPY;  Surgeon: Ceasar Mons, MD;  Location: [facility];  Service: Urology;  Laterality: Bilateral;   ESOPHAGOGASTRODUODENOSCOPY (EGD) WITH PROPOFOL N/A 05/11/2014   Procedure: ESOPHAGOGASTRODUODENOSCOPY (EGD) WITH PROPOFOL;  Surgeon: Lear Ng, MD;  Location: [facility] ENDOSCOPY;  Service: Endoscopy;  Laterality: N/A;  egd first   ESOPHAGOGASTRODUODENOSCOPY (EGD) WITH PROPOFOL N/A 05/08/2020   Procedure: ESOPHAGOGASTRODUODENOSCOPY (EGD) WITH PROPOFOL;  Surgeon: Arta Silence, MD;  Location: [facility] ENDOSCOPY;  Service: Endoscopy;  Laterality: N/A;   EUS N/A 06/21/2016   Procedure: ESOPHAGEAL ENDOSCOPIC ULTRASOUND (EUS) RADIAL;  Surgeon: Arta Silence, MD;  Location: [facility] ENDOSCOPY;  Service: Endoscopy;  Laterality: N/A;   FLEXIBLE SIGMOIDOSCOPY N/A 05/11/2014   Procedure: FLEXIBLE SIGMOIDOSCOPY;  Surgeon: Lear Ng, MD;  Location: [facility] ENDOSCOPY;  Service: Endoscopy;  Laterality: N/A;   LAPAROTOMY W/ BILATERAL SALPINGOOPHORECTOMY  09-26-2003   dr Matthew Saras at [place]  06-08-2003   dr Radene Knee [place] Left 12/19/2020   Procedure: TOTAL KNEE ARTHROPLASTY;  Surgeon: Paralee Cancel, MD;  Location: [facility] ORS;  Service: Orthopedics;  Laterality: Left;  70 mins   TOTAL KNEE ARTHROPLASTY Right 03/27/2021   Procedure: TOTAL KNEE ARTHROPLASTY;  Surgeon: Paralee Cancel, MD;  Location: [facility];  Service: Orthopedics;  Laterality: Right;  70 mins   TRANSTHORACIC ECHOCARDIOGRAM  04/29/2016   ef 60-65%,  grade 2 diastolic dysfunction/  trivial MR and TR   TUBAL LIGATION Bilateral yrs ago   UPPER ESOPHAGEAL ENDOSCOPIC  ULTRASOUND (EUS) N/A 05/08/2020   Procedure: UPPER ESOPHAGEAL ENDOSCOPIC ULTRASOUND (EUS);  Surgeon: Arta Silence, MD;  Location: [facility];  Service: Endoscopy;  Laterality: N/A;    FAMILY HISTORY: Family History  Problem Relation Age of Onset   Alzheimer's disease Mother    Hypertension Mother    Colon cancer Brother    Hypertension Brother    Other Father        ruptured appendix   Breast cancer Sister    Hypertension Sister    Hypertension Sister    Memory loss Brother  Hypertension Sister    Heart disease Sister     SOCIAL HISTORY: Social History   Socioeconomic History   Marital status: Married    Spouse name: Not on file   Number of children: 1   Years of education: 2 years college   Highest education level: Not on file  Occupational History   Occupation: Retired  Tobacco Use   Smoking status: Never   Smokeless tobacco: Never  Vaping Use   Vaping Use: Never used  Substance and Sexual Activity   Alcohol use: Yes    Comment: occ   Drug use: No   Sexual activity: Not Currently  Other Topics Concern   Not on file  Social History Narrative   Lives at home with husband.   Right-handed.   No caffeine use.   Social Determinants of Health   Financial Resource Strain: Not on file  Food Insecurity: Not on file  Transportation Needs: Not on file  Physical Activity: Not on file  Stress: Not on file  Social Connections: Not on file  Intimate Partner Violence: Not on file      Marcial Pacas, M.D. Ph.D.  [facility] [address] [place], [place] [address] Ph: [phone] Fax: [phone]  CC:  Shirline Frees, MD [address] [place],  [place] [address]  Shirline Frees, MD

## 2022-04-16 ENCOUNTER — Telehealth: Payer: Self-pay | Admitting: Neurology

## 2022-04-16 DIAGNOSIS — R899 Unspecified abnormal finding in specimens from other organs, systems and tissues: Secondary | ICD-10-CM | POA: Insufficient documentation

## 2022-04-16 DIAGNOSIS — G43709 Chronic migraine without aura, not intractable, without status migrainosus: Secondary | ICD-10-CM

## 2022-04-16 LAB — SEDIMENTATION RATE: Sed Rate: 109 mm/hr — ABNORMAL HIGH (ref 0–40)

## 2022-04-16 LAB — C-REACTIVE PROTEIN: CRP: 6 mg/L (ref 0–10)

## 2022-04-16 LAB — TSH: TSH: 2.03 u[IU]/mL (ref 0.450–4.500)

## 2022-04-17 ENCOUNTER — Telehealth: Payer: Self-pay | Admitting: *Deleted

## 2022-04-17 NOTE — Telephone Encounter (Signed)
Received clearance request for colonoscopy from [facility]. Dr. Krista Blue completed. Okay to proceed with procedure. Faxed back to their office at [phone].

## 2022-04-17 NOTE — Telephone Encounter (Signed)
The patient returned the call. I relayed the results. She stated she will come by either tomorrow or Monday to repeat her lab work. Her headache is a bit better. She stated she is not feeling like herself, most likely due to grief rather than the headache. She has experienced several losses in life recently. She expressed appreciation for the call.

## 2022-04-24 ENCOUNTER — Other Ambulatory Visit (INDEPENDENT_AMBULATORY_CARE_PROVIDER_SITE_OTHER): Payer: Self-pay

## 2022-04-24 DIAGNOSIS — G43709 Chronic migraine without aura, not intractable, without status migrainosus: Secondary | ICD-10-CM

## 2022-04-24 DIAGNOSIS — Z0289 Encounter for other administrative examinations: Secondary | ICD-10-CM

## 2022-04-24 DIAGNOSIS — R899 Unspecified abnormal finding in specimens from other organs, systems and tissues: Secondary | ICD-10-CM

## 2022-04-25 ENCOUNTER — Telehealth: Payer: Self-pay | Admitting: Neurology

## 2022-04-25 DIAGNOSIS — G43709 Chronic migraine without aura, not intractable, without status migrainosus: Secondary | ICD-10-CM

## 2022-04-25 DIAGNOSIS — R899 Unspecified abnormal finding in specimens from other organs, systems and tissues: Secondary | ICD-10-CM

## 2022-04-25 DIAGNOSIS — F5104 Psychophysiologic insomnia: Secondary | ICD-10-CM

## 2022-04-25 LAB — ANA W/REFLEX IF POSITIVE: Anti Nuclear Antibody (ANA): NEGATIVE

## 2022-04-25 LAB — HEMOGLOBIN: Hemoglobin: 12.3 g/dL (ref 11.1–15.9)

## 2022-04-25 LAB — HIGH SENSITIVITY CRP: CRP, High Sensitivity: 8.61 mg/L — ABNORMAL HIGH (ref 0.00–3.00)

## 2022-04-25 LAB — SEDIMENTATION RATE: Sed Rate: 80 mm/hr — ABNORMAL HIGH (ref 0–40)

## 2022-04-25 NOTE — Telephone Encounter (Signed)
Please call patient, laboratory evaluation showed significantly elevated ESR, C-reactive protein, suggestive of systemic inflammatory process,  In the setting of her increased daily headache, above findings could indicating temporal arteritis,  I would suggest her temporal artery biopsy  Please check on her headache  Multiple CT abdomen with contrast since February 2023 for evaluation of abdominal pain showed no acute abnormality  Please check with her if she still has abdominal pain

## 2022-04-25 NOTE — Telephone Encounter (Signed)
Left message for a return call

## 2022-04-25 NOTE — Telephone Encounter (Signed)
I called the patient.  Reports having 2-4 headache days per week, rather than daily. She has tried sumatriptan and it has been helpful. Taking nortriptyline '10mg'$  at bedtime.   She is concerned about her lab results. Verbalized feeling overwhelmed with findings and the possibility of temporal arteritis. Not opposed to moving forward with a biopsy.  She would feel more comfortable making this decision with Dr. Krista Blue.   While on the phone, she expressed feeling anxious which caused her to have stuttering speech. Says this happens often with stress. She has an appt to speak to her PCP about this issue.   Additionally, she is still having some issue with abdominal pain. States she was told it was likely diverticulitis. She has a pending colonoscopy the first week of August.

## 2022-04-26 MED ORDER — TRAZODONE HCL 50 MG PO TABS: 150.0000 mg | ORAL_TABLET | Freq: Every day | ORAL | 6 refills | Status: DC

## 2022-04-26 NOTE — Telephone Encounter (Signed)
Called patient, left message for her to call back for discussion.

## 2022-04-26 NOTE — Addendum Note (Signed)
Addended by: Marcial Pacas on: 04/26/2022 11:42 AM   Modules accepted: Orders

## 2022-04-26 NOTE — Telephone Encounter (Signed)
I was able to talk with patient and her husband, explained elevated ESR, C-reactive protein, could indicate systemic inflammatory process, with her increased headache, as a neurologist I worry about temporal arteritis, suggested her general surgical consultation, left temporal artery biopsy, she agreed with the plan  Nortriptyline up to 30 mg every night does not help, she complains of dry mouth, change of taste, will stop nortriptyline, changed to trazodone 50 mg, titrating to 150 mg,  Patient was tearful during today's visit, apparently suffer depression anxiety, chronic insomnia  She also complains of GI symptoms, alternating between diarrhea, with bloody mucus, constipation, history of ulcerative colitis, was treated with steroid in the past, steroids was stopped around 2021, colonoscopy is pending for May 28, 2022

## 2022-05-01 ENCOUNTER — Telehealth: Payer: Self-pay | Admitting: Neurology

## 2022-05-01 NOTE — Telephone Encounter (Signed)
Referral for General Surgery sent to [facility] [phone].

## 2022-05-02 DIAGNOSIS — G8929 Other chronic pain: Secondary | ICD-10-CM | POA: Diagnosis not present

## 2022-05-02 DIAGNOSIS — K5190 Ulcerative colitis, unspecified, without complications: Secondary | ICD-10-CM | POA: Diagnosis not present

## 2022-05-02 DIAGNOSIS — R4781 Slurred speech: Secondary | ICD-10-CM | POA: Diagnosis not present

## 2022-05-02 DIAGNOSIS — F324 Major depressive disorder, single episode, in partial remission: Secondary | ICD-10-CM | POA: Diagnosis not present

## 2022-05-02 DIAGNOSIS — E1169 Type 2 diabetes mellitus with other specified complication: Secondary | ICD-10-CM | POA: Diagnosis not present

## 2022-05-02 DIAGNOSIS — L218 Other seborrheic dermatitis: Secondary | ICD-10-CM | POA: Diagnosis not present

## 2022-05-02 DIAGNOSIS — I10 Essential (primary) hypertension: Secondary | ICD-10-CM | POA: Diagnosis not present

## 2022-05-02 DIAGNOSIS — E7800 Pure hypercholesterolemia, unspecified: Secondary | ICD-10-CM | POA: Diagnosis not present

## 2022-05-02 DIAGNOSIS — R700 Elevated erythrocyte sedimentation rate: Secondary | ICD-10-CM | POA: Diagnosis not present

## 2022-05-07 ENCOUNTER — Telehealth: Payer: Self-pay | Admitting: Neurology

## 2022-05-07 NOTE — Telephone Encounter (Signed)
Pt called and said she wants to let Dr. Krista Blue know she is having the Bio on the left side of her brain. Pt is requesting for Dr. Krista Blue to call her because she have some questions for her. Pt stated [facility] Dr. Kenton Kingfisher per [place] is looking for her another primary care doctor.

## 2022-05-08 NOTE — Telephone Encounter (Signed)
Pt is asking for a call back from [place], [place]

## 2022-05-08 NOTE — Telephone Encounter (Signed)
Left message for a return call.  Patient was referred for a left temporal artery biopsy.

## 2022-05-08 NOTE — Telephone Encounter (Signed)
I returned the call to the patient.   She wanted Dr. Kenton Kingfisher removed as her PCP. She will let us know when she establishes care with a new physician.  She has her surgical consultation on 05/24/22 for her biopsy.

## 2022-05-18 ENCOUNTER — Other Ambulatory Visit: Payer: Self-pay | Admitting: Neurology

## 2022-05-21 NOTE — Telephone Encounter (Signed)
Rx has been requested for 90 day supply. Rx sent to pharmacy.

## 2022-05-24 DIAGNOSIS — G44049 Chronic paroxysmal hemicrania, not intractable: Secondary | ICD-10-CM | POA: Diagnosis not present

## 2022-06-03 NOTE — Progress Notes (Signed)
Surgery orders requested via Epic inbox. °

## 2022-06-03 NOTE — Progress Notes (Signed)
COVID Vaccine Completed: yes x1  Date of COVID positive in last 90 days:  PCP -  Cardiologist -   Chest x-ray - 03/14/22 Epic EKG - 03/20/22 Epic Stress Test -  ECHO - 06/30/16 Epic Cardiac Cath -  Pacemaker/ICD device last checked: Spinal Cord Stimulator:  Bowel Prep -   Sleep Study -  CPAP -   Fasting Blood Sugar -  Checks Blood Sugar _____ times a day  Blood Thinner Instructions: Aspirin Instructions: Last Dose:  Activity level:  Can go up a flight of stairs and perform activities of daily living without stopping and without symptoms of chest pain or shortness of breath.  Able to exercise without symptoms  Unable to go up a flight of stairs without symptoms of     Anesthesia review:   Patient denies shortness of breath, fever, cough and chest pain at PAT appointment  Patient verbalized understanding of instructions that were given to them at the PAT appointment. Patient was also instructed that they will need to review over the PAT instructions again at home before surgery.

## 2022-06-04 ENCOUNTER — Encounter (HOSPITAL_COMMUNITY)
Admission: RE | Admit: 2022-06-04 | Discharge: 2022-06-04 | Disposition: A | Discharge status: Home / Self Care | Payer: Medicare HMO | Source: Ambulatory Visit | Attending: General Surgery | Admitting: General Surgery

## 2022-06-04 ENCOUNTER — Encounter (HOSPITAL_COMMUNITY): Payer: Self-pay

## 2022-06-04 VITALS — BP 135/103 | HR 101 | Temp 97.7°F | Resp 14 | Ht 64.0 in | Wt 232.0 lb

## 2022-06-04 DIAGNOSIS — B1920 Unspecified viral hepatitis C without hepatic coma: Secondary | ICD-10-CM | POA: Insufficient documentation

## 2022-06-04 DIAGNOSIS — Z01812 Encounter for preprocedural laboratory examination: Secondary | ICD-10-CM | POA: Diagnosis not present

## 2022-06-04 DIAGNOSIS — I10 Essential (primary) hypertension: Secondary | ICD-10-CM | POA: Insufficient documentation

## 2022-06-04 DIAGNOSIS — E119 Type 2 diabetes mellitus without complications: Secondary | ICD-10-CM | POA: Diagnosis not present

## 2022-06-04 LAB — COMPREHENSIVE METABOLIC PANEL
ALT: 13 U/L (ref 0–44)
AST: 13 U/L — ABNORMAL LOW (ref 15–41)
Albumin: 3.8 g/dL (ref 3.5–5.0)
Alkaline Phosphatase: 85 U/L (ref 38–126)
Anion gap: 7 (ref 5–15)
BUN: 15 mg/dL (ref 8–23)
CO2: 24 mmol/L (ref 22–32)
Calcium: 9.2 mg/dL (ref 8.9–10.3)
Chloride: 105 mmol/L (ref 98–111)
Creatinine, Ser: 0.65 mg/dL (ref 0.44–1.00)
GFR, Estimated: >60 mL/min (ref >60)
Glucose, Bld: 100 mg/dL — ABNORMAL HIGH (ref 70–99)
Potassium: 4.2 mmol/L (ref 3.5–5.1)
Sodium: 136 mmol/L (ref 135–145)
Total Bilirubin: 0.2 mg/dL — ABNORMAL LOW (ref 0.3–1.2)
Total Protein: 7.7 g/dL (ref 6.5–8.1)

## 2022-06-04 LAB — CBC
HCT: 44.1 % (ref 36.0–46.0)
Hemoglobin: 13.2 g/dL (ref 12.0–15.0)
MCH: 25.8 pg — ABNORMAL LOW (ref 26.0–34.0)
MCHC: 29.9 g/dL — ABNORMAL LOW (ref 30.0–36.0)
MCV: 86.3 fL (ref 80.0–100.0)
Platelets: 307 10*3/uL (ref 150–400)
RBC: 5.11 MIL/uL (ref 3.87–5.11)
RDW: 15.4 % (ref 11.5–15.5)
WBC: 5.6 10*3/uL (ref 4.0–10.5)
nRBC: 0 % (ref 0.0–0.2)

## 2022-06-04 LAB — HEMOGLOBIN A1C
Hgb A1c MFr Bld: 6.2 % — ABNORMAL HIGH (ref 4.8–5.6)
Mean Plasma Glucose: 131.24 mg/dL

## 2022-06-04 LAB — GLUCOSE, CAPILLARY: Glucose-Capillary: 121 mg/dL — ABNORMAL HIGH (ref 70–99)

## 2022-06-04 NOTE — Patient Instructions (Addendum)
SURGICAL WAITING ROOM VISITATION Patients having surgery or a procedure may have no more than 2 support people in the waiting area - these visitors may rotate.   Children under the age of 2 must have an adult with them who is not the patient. If the patient needs to stay at the hospital during part of their recovery, the visitor guidelines for inpatient rooms apply. Pre-op nurse will coordinate an appropriate time for 1 support person to accompany patient in pre-op.  This support person may not rotate.    Please refer to the [facility] website for the visitor guidelines for Inpatients (after your surgery is over and you are in a regular room).    Your procedure is scheduled on: 06/11/22   Report to [facility] Main Entrance    Report to admitting at 7:45 AM   Call this number if you have problems the morning of surgery [phone]   Do not eat food :After Midnight.   After Midnight you may have the following liquids until 7:00 AM DAY OF SURGERY  Water Non-Citrus Juices (without pulp, NO RED) Carbonated Beverages Black Coffee (NO MILK/CREAM OR CREAMERS, sugar ok)  Clear Tea (NO MILK/CREAM OR CREAMERS, sugar ok) regular and decaf                             Plain Jell-O (NO RED)                                           Fruit ices (not with fruit pulp, NO RED)                                     Popsicles (NO RED)                                                               Sports drinks like Gatorade (NO RED)              FOLLOW BOWEL PREP AND ANY ADDITIONAL PRE OP INSTRUCTIONS YOU RECEIVED FROM YOUR SURGEON'S OFFICE!!!     Oral Hygiene is also important to reduce your risk of infection.                                    Remember - BRUSH YOUR TEETH THE MORNING OF SURGERY WITH YOUR REGULAR TOOTHPASTE   Take these medicines the morning of surgery with A SIP OF WATER: Valium, Nexium, Fluoxetine, Oxycodone, Tramadol.  DO NOT TAKE ANY ORAL DIABETIC MEDICATIONS DAY OF YOUR  SURGERY  How to Manage Your Diabetes Before and After Surgery  Why is it important to control my blood sugar before and after surgery? Improving blood sugar levels before and after surgery helps healing and can limit problems. A way of improving blood sugar control is eating a healthy diet by:  Eating less sugar and carbohydrates  Increasing activity/exercise  Talking with your doctor about reaching your blood sugar goals High blood sugars (greater than  180 mg/dL) can raise your risk of infections and slow your recovery, so you will need to focus on controlling your diabetes during the weeks before surgery. Make sure that the doctor who takes care of your diabetes knows about your planned surgery including the date and location.  How do I manage my blood sugar before surgery? Check your blood sugar at least 4 times a day, starting 2 days before surgery, to make sure that the level is not too high or low. Check your blood sugar the morning of your surgery when you wake up and every 2 hours until you get to the Short Stay unit. If your blood sugar is less than 70 mg/dL, you will need to treat for low blood sugar: Do not take insulin. Treat a low blood sugar (less than 70 mg/dL) with  cup of clear juice (cranberry or apple), 4 glucose tablets, OR glucose gel. Recheck blood sugar in 15 minutes after treatment (to make sure it is greater than 70 mg/dL). If your blood sugar is not greater than 70 mg/dL on recheck, call [phone] for further instructions. Report your blood sugar to the short stay nurse when you get to Short Stay.  If you are admitted to the hospital after surgery: Your blood sugar will be checked by the staff and you will probably be given insulin after surgery (instead of oral diabetes medicines) to make sure you have good blood sugar levels. The goal for blood sugar control after surgery is 80-180 mg/dL  Reviewed and Endorsed by [facility] Patient Education Committee,  August 2015                               You may not have any metal on your body including hair pins, jewelry, and body piercing             Do not wear make-up, lotions, powders, perfumes, or deodorant  Do not wear nail polish including gel and S&S, artificial/acrylic nails, or any other type of covering on natural nails including finger and toenails. If you have artificial nails, gel coating, etc. that needs to be removed by a nail salon please have this removed prior to surgery or surgery may need to be canceled/ delayed if the surgeon/ anesthesia feels like they are unable to be safely monitored.   Do not shave  48 hours prior to surgery.    Do not bring valuables to the hospital. [place].  DO NOT [place]. PHARMACY WILL DISPENSE MEDICATIONS LISTED ON YOUR MEDICATION LIST TO YOU DURING YOUR ADMISSION [place]!    Patients discharged on the day of surgery will not be allowed to drive home.  Someone NEEDS to stay with you for the first 24 hours after anesthesia.   Special Instructions: Bring a copy of your healthcare power of attorney and living will documents         the day of surgery if you haven't scanned them before.              Please read over the following fact sheets you were given: IF YOU HAVE QUESTIONS ABOUT YOUR PRE-OP INSTRUCTIONS PLEASE CALL [place] - Preparing for Surgery Before surgery, you can play an important role.  Because skin is  not sterile, your skin needs to be as free of germs as possible.  You can reduce the number of germs on your skin by washing with CHG (chlorahexidine gluconate) soap before surgery.  CHG is an antiseptic cleaner which kills germs and bonds with the skin to continue killing germs even after washing. Please DO NOT use if you have an allergy to CHG or antibacterial soaps.  If your skin becomes reddened/irritated stop using  the CHG and inform your nurse when you arrive at Short Stay. Do not shave (including legs and underarms) for at least 48 hours prior to the first CHG shower.  You may shave your face/neck.  Please follow these instructions carefully:  1.  Shower with CHG Soap the night before surgery and the  morning of surgery.  2.  If you choose to wash your hair, wash your hair first as usual with your normal  shampoo.  3.  After you shampoo, rinse your hair and body thoroughly to remove the shampoo.                             4.  Use CHG as you would any other liquid soap.  You can apply chg directly to the skin and wash.  Gently with a scrungie or clean washcloth.  5.  Apply the CHG Soap to your body ONLY FROM THE NECK DOWN.   Do   not use on face/ open                           Wound or open sores. Avoid contact with eyes, ears mouth and   genitals (private parts).                       Wash face,  Genitals (private parts) with your normal soap.             6.  Wash thoroughly, paying special attention to the area where your    surgery  will be performed.  7.  Thoroughly rinse your body with warm water from the neck down.  8.  DO NOT shower/wash with your normal soap after using and rinsing off the CHG Soap.                9.  Pat yourself dry with a clean towel.            10.  Wear clean pajamas.            11.  Place clean sheets on your bed the night of your first shower and do not  sleep with pets. Day of Surgery : Do not apply any lotions/deodorants the morning of surgery.  Please wear clean clothes to the hospital/surgery center.  FAILURE TO FOLLOW THESE INSTRUCTIONS MAY RESULT IN THE CANCELLATION OF YOUR SURGERY  PATIENT SIGNATURE_________________________________  NURSE SIGNATURE__________________________________  ________________________________________________________________________

## 2022-06-05 DIAGNOSIS — F322 Major depressive disorder, single episode, severe without psychotic features: Secondary | ICD-10-CM | POA: Diagnosis not present

## 2022-06-05 DIAGNOSIS — Z1159 Encounter for screening for other viral diseases: Secondary | ICD-10-CM | POA: Diagnosis not present

## 2022-06-05 DIAGNOSIS — Z79899 Other long term (current) drug therapy: Secondary | ICD-10-CM | POA: Diagnosis not present

## 2022-06-05 DIAGNOSIS — E559 Vitamin D deficiency, unspecified: Secondary | ICD-10-CM | POA: Diagnosis not present

## 2022-06-05 DIAGNOSIS — I10 Essential (primary) hypertension: Secondary | ICD-10-CM | POA: Diagnosis not present

## 2022-06-05 DIAGNOSIS — E1169 Type 2 diabetes mellitus with other specified complication: Secondary | ICD-10-CM | POA: Diagnosis not present

## 2022-06-05 DIAGNOSIS — Z23 Encounter for immunization: Secondary | ICD-10-CM | POA: Diagnosis not present

## 2022-06-05 DIAGNOSIS — G43909 Migraine, unspecified, not intractable, without status migrainosus: Secondary | ICD-10-CM | POA: Diagnosis not present

## 2022-06-07 ENCOUNTER — Ambulatory Visit: Payer: Self-pay | Admitting: General Surgery

## 2022-06-11 ENCOUNTER — Other Ambulatory Visit: Payer: Self-pay

## 2022-06-11 ENCOUNTER — Encounter (HOSPITAL_COMMUNITY): Payer: Self-pay | Admitting: General Surgery

## 2022-06-11 ENCOUNTER — Ambulatory Visit (HOSPITAL_COMMUNITY): Payer: Medicare HMO | Admitting: Physician Assistant

## 2022-06-11 ENCOUNTER — Encounter (HOSPITAL_COMMUNITY)
Admission: RE | Disposition: A | Discharge status: Home / Self Care | Payer: Self-pay | Source: Home / Self Care | Attending: General Surgery

## 2022-06-11 ENCOUNTER — Ambulatory Visit (HOSPITAL_BASED_OUTPATIENT_CLINIC_OR_DEPARTMENT_OTHER): Payer: Medicare HMO | Admitting: Anesthesiology

## 2022-06-11 ENCOUNTER — Ambulatory Visit (HOSPITAL_COMMUNITY)
Admission: RE | Admit: 2022-06-11 | Discharge: 2022-06-11 | Disposition: A | Discharge status: Home / Self Care | Payer: Medicare HMO | Attending: General Surgery | Admitting: General Surgery

## 2022-06-11 DIAGNOSIS — Z6838 Body mass index (BMI) 38.0-38.9, adult: Secondary | ICD-10-CM | POA: Insufficient documentation

## 2022-06-11 DIAGNOSIS — I10 Essential (primary) hypertension: Secondary | ICD-10-CM | POA: Diagnosis not present

## 2022-06-11 DIAGNOSIS — M797 Fibromyalgia: Secondary | ICD-10-CM | POA: Insufficient documentation

## 2022-06-11 DIAGNOSIS — Z79899 Other long term (current) drug therapy: Secondary | ICD-10-CM | POA: Insufficient documentation

## 2022-06-11 DIAGNOSIS — R519 Headache, unspecified: Secondary | ICD-10-CM | POA: Insufficient documentation

## 2022-06-11 DIAGNOSIS — F418 Other specified anxiety disorders: Secondary | ICD-10-CM

## 2022-06-11 DIAGNOSIS — K759 Inflammatory liver disease, unspecified: Secondary | ICD-10-CM | POA: Diagnosis not present

## 2022-06-11 DIAGNOSIS — E119 Type 2 diabetes mellitus without complications: Secondary | ICD-10-CM | POA: Diagnosis not present

## 2022-06-11 DIAGNOSIS — K219 Gastro-esophageal reflux disease without esophagitis: Secondary | ICD-10-CM | POA: Diagnosis not present

## 2022-06-11 HISTORY — PX: ARTERY BIOPSY: SHX891

## 2022-06-11 LAB — GLUCOSE, CAPILLARY
Glucose-Capillary: 112 mg/dL — ABNORMAL HIGH (ref 70–99)
Glucose-Capillary: 60 mg/dL — ABNORMAL LOW (ref 70–99)
Glucose-Capillary: 64 mg/dL — ABNORMAL LOW (ref 70–99)
Glucose-Capillary: 78 mg/dL (ref 70–99)

## 2022-06-11 SURGERY — BIOPSY TEMPORAL ARTERY
Anesthesia: Monitor Anesthesia Care | Laterality: Left

## 2022-06-11 MED ORDER — OXYCODONE HCL 5 MG PO TABS: 5.0000 mg | ORAL_TABLET | Freq: Once | ORAL | Status: DC | PRN

## 2022-06-11 MED ORDER — LIDOCAINE 2% (20 MG/ML) 5 ML SYRINGE
INTRAMUSCULAR | Status: DC | PRN
  Administered 2022-06-11: 100 mg via INTRAVENOUS

## 2022-06-11 MED ORDER — LACTATED RINGERS IV SOLN: INTRAVENOUS | Status: DC

## 2022-06-11 MED ORDER — OXYCODONE HCL 5 MG PO TABS
ORAL_TABLET | ORAL | Status: AC
  Filled 2022-06-11: qty 1

## 2022-06-11 MED ORDER — MEPERIDINE HCL 50 MG/ML IJ SOLN: 6.2500 mg | INTRAMUSCULAR | Status: DC | PRN

## 2022-06-11 MED ORDER — FENTANYL CITRATE PF 50 MCG/ML IJ SOSY
PREFILLED_SYRINGE | INTRAMUSCULAR | Status: AC
  Filled 2022-06-11: qty 1

## 2022-06-11 MED ORDER — LIDOCAINE 2% (20 MG/ML) 5 ML SYRINGE
INTRAMUSCULAR | Status: AC
  Filled 2022-06-11: qty 5

## 2022-06-11 MED ORDER — ORAL CARE MOUTH RINSE: 15.0000 mL | Freq: Once | OROMUCOSAL | Status: AC

## 2022-06-11 MED ORDER — PROPOFOL 10 MG/ML IV BOLUS
INTRAVENOUS | Status: DC | PRN
  Administered 2022-06-11 (×4): 10 mg via INTRAVENOUS

## 2022-06-11 MED ORDER — CHLORHEXIDINE GLUCONATE 0.12 % MT SOLN
15.0000 mL | Freq: Once | OROMUCOSAL | Status: AC
  Administered 2022-06-11: 15 mL via OROMUCOSAL

## 2022-06-11 MED ORDER — PROMETHAZINE HCL 25 MG/ML IJ SOLN: 6.2500 mg | INTRAMUSCULAR | Status: DC | PRN

## 2022-06-11 MED ORDER — MIDAZOLAM HCL 2 MG/2ML IJ SOLN: 0.5000 mg | Freq: Once | INTRAMUSCULAR | Status: DC | PRN

## 2022-06-11 MED ORDER — ONDANSETRON HCL 4 MG/2ML IJ SOLN
INTRAMUSCULAR | Status: AC
  Filled 2022-06-11: qty 2

## 2022-06-11 MED ORDER — MIDAZOLAM HCL 5 MG/5ML IJ SOLN
INTRAMUSCULAR | Status: DC | PRN
  Administered 2022-06-11: 2 mg via INTRAVENOUS

## 2022-06-11 MED ORDER — CEFAZOLIN SODIUM-DEXTROSE 2-4 GM/100ML-% IV SOLN
2.0000 g | INTRAVENOUS | Status: AC
  Administered 2022-06-11: 2 g via INTRAVENOUS
  Filled 2022-06-11: qty 100

## 2022-06-11 MED ORDER — PROPOFOL 500 MG/50ML IV EMUL
INTRAVENOUS | Status: AC
  Filled 2022-06-11: qty 50

## 2022-06-11 MED ORDER — FENTANYL CITRATE (PF) 100 MCG/2ML IJ SOLN
INTRAMUSCULAR | Status: AC
  Filled 2022-06-11: qty 2

## 2022-06-11 MED ORDER — FENTANYL CITRATE PF 50 MCG/ML IJ SOSY
25.0000 ug | PREFILLED_SYRINGE | INTRAMUSCULAR | Status: DC | PRN
  Administered 2022-06-11 (×2): 50 ug via INTRAVENOUS

## 2022-06-11 MED ORDER — FENTANYL CITRATE (PF) 100 MCG/2ML IJ SOLN
INTRAMUSCULAR | Status: DC | PRN
  Administered 2022-06-11 (×2): 50 ug via INTRAVENOUS

## 2022-06-11 MED ORDER — BUPIVACAINE-EPINEPHRINE 0.25% -1:200000 IJ SOLN
INTRAMUSCULAR | Status: DC | PRN
  Administered 2022-06-11: 8 mL

## 2022-06-11 MED ORDER — OXYCODONE HCL 5 MG/5ML PO SOLN: 5.0000 mg | Freq: Once | ORAL | Status: DC | PRN

## 2022-06-11 MED ORDER — CHLORHEXIDINE GLUCONATE CLOTH 2 % EX PADS: 6.0000 | MEDICATED_PAD | Freq: Once | CUTANEOUS | Status: DC

## 2022-06-11 MED ORDER — PROPOFOL 10 MG/ML IV BOLUS
INTRAVENOUS | Status: AC
  Filled 2022-06-11: qty 20

## 2022-06-11 MED ORDER — PROPOFOL 500 MG/50ML IV EMUL
INTRAVENOUS | Status: DC | PRN
  Administered 2022-06-11: 50 ug/kg/min via INTRAVENOUS

## 2022-06-11 MED ORDER — BUPIVACAINE-EPINEPHRINE 0.5% -1:200000 IJ SOLN
INTRAMUSCULAR | Status: AC
  Filled 2022-06-11: qty 1

## 2022-06-11 MED ORDER — MIDAZOLAM HCL 2 MG/2ML IJ SOLN
INTRAMUSCULAR | Status: AC
  Filled 2022-06-11: qty 2

## 2022-06-11 SURGICAL SUPPLY — 48 items
ADH SKN CLS APL DERMABOND .7 (GAUZE/BANDAGES/DRESSINGS) ×1
APL PRP STRL LF DISP 70% ISPRP (MISCELLANEOUS) ×1
BAG COUNTER SPONGE SURGICOUNT (BAG) IMPLANT
BAG SPNG CNTER NS LX DISP (BAG)
BLADE SURG 15 STRL LF DISP TIS (BLADE) ×2 IMPLANT
BLADE SURG 15 STRL SS (BLADE) ×1
CHLORAPREP W/TINT 26 (MISCELLANEOUS) ×2 IMPLANT
COVER SURGICAL LIGHT HANDLE (MISCELLANEOUS) ×2 IMPLANT
DERMABOND ADVANCED (GAUZE/BANDAGES/DRESSINGS) ×1
DERMABOND ADVANCED .7 DNX12 (GAUZE/BANDAGES/DRESSINGS) ×2 IMPLANT
DISSECTOR ROUND CHERRY 3/8 STR (MISCELLANEOUS) IMPLANT
DRAPE LAPAROTOMY T 98X78 PEDS (DRAPES) ×2 IMPLANT
DRSG IV TEGADERM 3.5X4.5 STRL (GAUZE/BANDAGES/DRESSINGS) IMPLANT
DRSG TEGADERM 2-3/8X2-3/4 SM (GAUZE/BANDAGES/DRESSINGS) IMPLANT
DRSG TELFA 3X8 NADH (GAUZE/BANDAGES/DRESSINGS) ×1 IMPLANT
ELECT NDL TIP 2.8 STRL (NEEDLE) IMPLANT
ELECT NEEDLE TIP 2.8 STRL (NEEDLE) IMPLANT
ELECT REM PT RETURN 15FT ADLT (MISCELLANEOUS) ×2 IMPLANT
GAUZE 4X4 16PLY ~~LOC~~+RFID DBL (SPONGE) ×2 IMPLANT
GAUZE SPONGE 2X2 8PLY STRL LF (GAUZE/BANDAGES/DRESSINGS) IMPLANT
GAUZE SPONGE 4X4 12PLY STRL (GAUZE/BANDAGES/DRESSINGS) IMPLANT
GLOVE BIOGEL PI IND STRL 7.0 (GLOVE) ×2 IMPLANT
GLOVE BIOGEL PI INDICATOR 7.0 (GLOVE) ×1
GLOVE SURG SS PI 7.0 STRL IVOR (GLOVE) ×2 IMPLANT
GOWN STRL REUS W/ TWL LRG LVL3 (GOWN DISPOSABLE) ×2 IMPLANT
GOWN STRL REUS W/ TWL XL LVL3 (GOWN DISPOSABLE) IMPLANT
GOWN STRL REUS W/TWL LRG LVL3 (GOWN DISPOSABLE) ×1
GOWN STRL REUS W/TWL XL LVL3 (GOWN DISPOSABLE)
KIT BASIN OR (CUSTOM PROCEDURE TRAY) ×2 IMPLANT
KIT TURNOVER KIT A (KITS) IMPLANT
NEEDLE HYPO 22GX1.5 SAFETY (NEEDLE) IMPLANT
PACK BASIC VI WITH GOWN DISP (CUSTOM PROCEDURE TRAY) ×2 IMPLANT
PAD DRESSING TELFA 3X8 NADH (GAUZE/BANDAGES/DRESSINGS) IMPLANT
PENCIL SMOKE EVACUATOR (MISCELLANEOUS) IMPLANT
SUCTION FRAZIER HANDLE 12FR (TUBING)
SUCTION TUBE FRAZIER 12FR DISP (TUBING) IMPLANT
SUT MNCRL AB 4-0 PS2 18 (SUTURE) ×2 IMPLANT
SUT SILK 2 0 (SUTURE)
SUT SILK 2-0 18XBRD TIE 12 (SUTURE) IMPLANT
SUT SILK 3 0 (SUTURE) ×1
SUT SILK 3-0 18XBRD TIE 12 (SUTURE) IMPLANT
SUT SILK 4-0 12X30IN (SUTURE) ×2 IMPLANT
SUT VIC AB 3-0 SH 27 (SUTURE) ×1
SUT VIC AB 3-0 SH 27X BRD (SUTURE) ×2 IMPLANT
SUT VIC AB 3-0 SH 27XBRD (SUTURE) IMPLANT
SYR CONTROL 10ML LL (SYRINGE) IMPLANT
TOWEL OR 17X26 10 PK STRL BLUE (TOWEL DISPOSABLE) ×2 IMPLANT
TOWEL OR NON WOVEN STRL DISP B (DISPOSABLE) ×2 IMPLANT

## 2022-06-11 NOTE — H&P (Signed)
Chief Complaint: Temporal Artery Biopsy Consult  History of Present Illness: Erin Wolf is a 66 y.o. female who is seen today as an office consultation at the request of Dr. Krista Blue for evaluation of Temporal Artery Biopsy Consult .  She has been having headaches especially over the left side. She also notes floaters over her left eye. She also has new stuttering and slurring and balance issues with multiple falls.  Review of Systems: A complete review of systems was obtained from the patient. I have reviewed this information and discussed as appropriate with the patient. See HPI as well for other ROS.  Review of Systems  Constitutional: Negative.  HENT: Negative.  Eyes: Negative.  Respiratory: Negative.  Cardiovascular: Negative.  Gastrointestinal: Negative.  Genitourinary: Negative.  Musculoskeletal: Negative.  Skin: Negative.  Neurological: Positive for headaches.  Endo/Heme/Allergies: Negative.  Psychiatric/Behavioral: Negative.   Medical History: Past Medical History:  Diagnosis Date  Anemia  Anxiety  Arthritis  Diabetes mellitus without complication (CMS-HCC)  GERD (gastroesophageal reflux disease)  Hyperlipidemia  Hypertension  IBS (irritable bowel syndrome)  Lupus (CMS-HCC)  Seizure (CMS-HCC)   There is no problem list on file for this patient.  Past Surgical History:  Procedure Laterality Date  [place]  Right and Left Knee Replacement 2022  Dr. Salli Quarry    Allergies  Allergen Reactions  Nsaids (Non-Steroidal Anti-Inflammatory Drug) Other (See Comments)  Stomach Cramps  Penicillin Hives  Toradol [Ketorolac] Itching and Nausea   Current Outpatient Medications on File Prior to Visit  Medication Sig Dispense Refill  cyclobenzaprine (FLEXERIL) 10 MG tablet Take 10 mg by mouth once daily as needed  diazePAM (VALIUM) 5 MG tablet Take 1 tablet by mouth 2 (two) times daily as needed  oxyCODONE (DAZIDOX) 10 mg immediate release  tablet TAKE TWO TABLETS BY MOUTH THREE TIMES DAILY AS NEEDED  FLUoxetine (PROZAC) 20 MG capsule Take 1 capsule by mouth once daily  FUROsemide (LASIX) 20 MG tablet TAKE ONE TABLET BY MOUTH AS NEEDED FOR EDEMA ONCE daily FOR 30 DAYS  multivitamin tablet Take 1 tablet by mouth once daily  rosuvastatin (CRESTOR) 10 MG tablet TAKE ONE TABLET BY MOUTH ONCE A WEEK   No current facility-administered medications on file prior to visit.   Family History  Problem Relation Age of Onset  Stroke Mother  Hyperlipidemia (Elevated cholesterol) Mother  High blood pressure (Hypertension) Mother  Stroke Sister  Obesity Sister  High blood pressure (Hypertension) Sister  Hyperlipidemia (Elevated cholesterol) Sister  Diabetes Sister  Coronary Artery Disease (Blocked arteries around heart) Sister  Breast cancer Sister  Colon cancer Brother  Diabetes Brother  Hyperlipidemia (Elevated cholesterol) Brother  High blood pressure (Hypertension) Brother  Obesity Brother  Stroke Brother    Social History   Tobacco Use  Smoking Status Never  Smokeless Tobacco Not on file    Social History   Socioeconomic History  Marital status: Married  Tobacco Use  Smoking status: Never  Substance and Sexual Activity  Alcohol use: Yes  Comment: Special Occasions  Drug use: Never   Objective:   Vitals:  05/24/22 1402  BP: (!) 158/90  Pulse: 96  Temp: 36.8 C (98.2 F)  SpO2: 99%  Weight: (!) 107 kg (235 lb 12.8 oz)  Height: 165.1 cm (5' 5" )   Body mass index is 39.24 kg/m.  Physical Exam Constitutional:  Appearance: Normal appearance.  HENT:  Head: Normocephalic and atraumatic.  Pulmonary:  Effort: Pulmonary effort is normal.  Musculoskeletal:  General: Normal  range of motion.  Cervical back: Normal range of motion.  Neurological:  General: No focal deficit present.  Mental Status: She is alert and oriented to person, place, and time. Mental status is at baseline.  Psychiatric:  Mood and  Affect: Mood normal.  Behavior: Behavior normal.  Thought Content: Thought content normal.    Labs, Imaging and Diagnostic Testing: I reviewed notes from Dr. Krista Blue, I reviewed  Assessment and Plan:  Diagnoses and all orders for this visit:  Chronic paroxysmal hemicrania, not intractable    Erin Wolf is a 66 y.o. female with concerns for headaches and neurologic changes.  In her history she appears to have Ulcerative colitis followed by [facility] and systemic lupus erythematosus followed by Dr. Gerilyn Nestle. Either of these diseases could lead to elevated ESR or CRP.  She has multiple neurologic symptoms including headaches, stuttering, slurred speech, and falling/balance. She states she had intracranial imaging but I do not see it in records  We will plan to proceed with temporal artery biopsy to rule out temporal arteritis/giant cell arteritis as cause of her headaches

## 2022-06-11 NOTE — Op Note (Signed)
Preoperative diagnosis: headaches  Postoperative diagnosis: same   Procedure: excisional left temporal artery biopsy  Surgeon: Gurney Maxin, M.D.  Asst: none  Anesthesia: Monitored Local Anesthesia with Sedation  Indications for procedure: Erin Wolf is a 66 y.o. year old female with symptoms of recurring headaches not improving with medications. Due to concern for temporal arteritis decision was made to proceed with excisional temporal artery biopsy.  Description of procedure: The patient was brought into the operative suite. Anesthesia was administered with Monitored Local Anesthesia with Sedation. WHO checklist was applied. The patient was then placed in supine position. The hair above the ear was clipped. The area was prepped and draped in the usual sterile fashion.  Next, doppler was used to identify the temporal artery. The area was infused with marcaine for anesthetic. A vertical incision was over the area. Once through the skin, blunt dissection was used to identify the artery. doppler was used to confirm the location. The artery was dissected proximally and distally for length. Next, 3-0 silk ties and clips were used to ligate the artery and the artery was divided at each end and sent to pathology. The area was irrigated and hemostasis was intact. doppler was then used to confirm removal of the artery and no other arterial structures in the area. The skin was then closed with 3-0 vicryl in the deep dermal level and 4-0 monocryl subcuticular running suture for the superficial layer. Dermabond was put in place for dressing. The patient was brought to pacu in stable condition.  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 General, Bariatric, & Minimally Invasive Surgery [facility]]

## 2022-06-11 NOTE — Transfer of Care (Signed)
Immediate Anesthesia Transfer of Care Note  Patient: Erin Wolf  Procedure(s) Performed: LEFT BIOPSY TEMPORAL ARTERY (Left)  Patient Location: PACU  Anesthesia Type:MAC  Level of Consciousness: sedated  Airway & Oxygen Therapy: Patient Spontanous Breathing and Patient connected to face mask oxygen  Post-op Assessment: Report given to RN and Post -op Vital signs reviewed and stable  Post vital signs: Reviewed and stable  Last Vitals:  Vitals Value Taken Time  BP    Temp    Pulse 91 06/11/22 1707  Resp    SpO2 100 % 06/11/22 1707  Vitals shown include unvalidated device data.  Last Pain:  Vitals:   06/11/22 1421  TempSrc:   PainSc: 8          Complications: No notable events documented.

## 2022-06-11 NOTE — Anesthesia Preprocedure Evaluation (Addendum)
Anesthesia Evaluation  Patient identified by MRN, date of birth, ID band Patient awake    Reviewed: Allergy & Precautions, NPO status , Patient's Chart, lab work & pertinent test results  History of Anesthesia Complications Negative for: history of anesthetic complications  Airway Mallampati: II  TM Distance: >3 FB Neck ROM: Full    Dental  (+) Dental Advisory Given   Pulmonary neg pulmonary ROS,    breath sounds clear to auscultation       Cardiovascular hypertension, Pt. on medications (-) angina Rhythm:Regular Rate:Normal  '17 ECHO: EF 60-65%. Wall motion was normal; there were no regional wall motion abnormalities. grade 2 DD, no significant valvular abnormalities   Neuro/Psych  Headaches, Seizures -, Well Controlled,  Anxiety Depression    GI/Hepatic GERD  Medicated and Controlled,(+) Hepatitis -  Endo/Other  diabetes (diet controlled )Morbid obesitySLE: not on steroids presently  Renal/GU      Musculoskeletal  (+) Arthritis , Fibromyalgia -  Abdominal (+) + obese,   Peds  Hematology negative hematology ROS (+)   Anesthesia Other Findings   Reproductive/Obstetrics                            Anesthesia Physical Anesthesia Plan  ASA: 3  Anesthesia Plan: MAC   Post-op Pain Management: Tylenol PO (pre-op)*   Induction:   PONV Risk Score and Plan: 2 and Ondansetron and Treatment may vary due to age or medical condition  Airway Management Planned: Natural Airway and Nasal Cannula  Additional Equipment: None  Intra-op Plan:   Post-operative Plan:   Informed Consent: I have reviewed the patients History and Physical, chart, labs and discussed the procedure including the risks, benefits and alternatives for the proposed anesthesia with the patient or authorized representative who has indicated his/her understanding and acceptance.     Dental advisory given  Plan Discussed  with: CRNA and Surgeon  Anesthesia Plan Comments:         Anesthesia Quick Evaluation

## 2022-06-11 NOTE — Anesthesia Postprocedure Evaluation (Signed)
Anesthesia Post Note  Patient: Erin Wolf  Procedure(s) Performed: LEFT BIOPSY TEMPORAL ARTERY (Left)     Patient location during evaluation: Phase II Anesthesia Type: MAC Level of consciousness: awake and alert, patient cooperative and oriented Pain management: pain level controlled Vital Signs Assessment: post-procedure vital signs reviewed and stable Respiratory status: spontaneous breathing, nonlabored ventilation and respiratory function stable Cardiovascular status: blood pressure returned to baseline and stable Postop Assessment: no apparent nausea or vomiting and able to ambulate Anesthetic complications: no   No notable events documented.  Last Vitals:  Vitals:   06/11/22 1730 06/11/22 1828  BP: (!) 153/81 133/79  Pulse: 95 90  Resp: (!) 24 20  Temp:    SpO2: 99% 100%    Last Pain:  Vitals:   06/11/22 1828  TempSrc:   PainSc: 0-No pain                 Adi Doro,E. Dhiya Smits

## 2022-06-12 ENCOUNTER — Encounter (HOSPITAL_COMMUNITY): Payer: Self-pay | Admitting: General Surgery

## 2022-06-13 ENCOUNTER — Ambulatory Visit
Admission: EM | Admit: 2022-06-13 | Discharge: 2022-06-13 | Disposition: A | Discharge status: Home / Self Care | Payer: Medicare HMO | Attending: Family Medicine | Admitting: Family Medicine

## 2022-06-13 DIAGNOSIS — R519 Headache, unspecified: Secondary | ICD-10-CM | POA: Diagnosis not present

## 2022-06-13 DIAGNOSIS — T148XXA Other injury of unspecified body region, initial encounter: Secondary | ICD-10-CM | POA: Diagnosis not present

## 2022-06-13 LAB — SURGICAL PATHOLOGY

## 2022-06-13 NOTE — ED Provider Notes (Signed)
[facility]    CSN: [id number] Arrival date & time: 06/13/22  1317      History   Chief Complaint Chief Complaint  Patient presents with   Wound Check    HPI Erin Wolf is a 66 y.o. female.    Wound Check   Here for Erin Wolf to check her wound.  On August 22 she had temporal artery biopsy.  This morning she awoke to find that the bandage had blood soaked through.  Also today she has begun having a headache.  This is like her headache she has been having in the last few months.  There is some photophobia but no nausea.  She states she does have some medicine at home to take as needed for the pain  Earlier she measured 100.1 fever, but it has not returned since she took some Tylenol then  Cough or cold symptoms.  No vomiting or diarrhea.  Sore throat    Past Medical History:  Diagnosis Date   Anxiety    Arthritis    Chronic abdominal pain    Chronic lower back pain    Chronic nausea    Depression    Diabetes mellitus without complication (HCC)    Diverticulosis    Fibromyalgia    Frequency of urination    GERD (gastroesophageal reflux disease)    Headache    Hematuria    Hepatitis    Hepatitis C antibody positive in blood    per pt told by pcp   History of panic attacks    History of syncope    hx recurrent syncope --- per epic domentation non-cardiac , orthostatic hypotension, anxiety, dehydration   History of uterine fibroid    HTN (hypertension), benign    Hyperlipidemia 01/03/2012   IBS (irritable bowel syndrome)    Myalgia    Sciatic nerve disease, left 2023   Seizure disorder ([place]) followed by pcp until new neurologist appr. in jan 2019   "started having them in my 20's" ,  petit mal ----  per pt last seizure one 2015   SLE (systemic lupus erythematosus) [facility])    rheumatologist-- dr Gerilyn Nestle (consult 11-12-2016) having work-up done   Ulcerative colitis    followed by dr Penelope Coop at [place]   Wears glasses     Patient Active Problem  List   Diagnosis Date Noted   Abnormal laboratory test result 04/16/2022   Chronic migraine w/o aura w/o status migrainosus, not intractable 04/15/2022   Chronic insomnia 04/15/2022   S/P total knee arthroplasty, right 03/27/2021   Osteoarthritis of left knee 12/19/2020   Status post total left knee replacement 12/19/2020   Chronic abdominal pain 05/05/2020   Chronic pain of left knee    Chronic migraine 11/10/2018   Nausea & vomiting 10/18/2016   History of ulcerative colitis    LLQ abdominal pain 10/17/2016   Syncope and collapse 06/29/2016   Left-sided weakness 06/29/2016   Facial twitching 06/29/2016   IBS (irritable bowel syndrome) 06/29/2016   Faintness    Dehydration 05/09/2014   GERD (gastroesophageal reflux disease) 05/09/2014   Gastroenteritis 05/09/2014   Chest pain, atypical 05/09/2014   Chronic ulcerative colitis ([place]) 05/09/2014   Acute gastroenteritis 05/09/2014   Ulcerative colitis ([place]) 05/27/2013   Hypokalemia 05/27/2013   Anemia 01/04/2012   Transaminitis 01/04/2012   Diarrhea 01/04/2012   Chest pain 01/03/2012   SOB (shortness of breath) 01/03/2012   HTN (hypertension), benign 01/03/2012   Hx of migraines 01/03/2012  Seizure ([place]) 01/03/2012   Depression 01/03/2012   Ulcerative colitis 01/03/2012   Chronic back pain 01/03/2012   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   Hyperlipidemia 01/03/2012   Hepatitis C 01/03/2012   Fibroids 01/03/2012    Past Surgical History:  Procedure Laterality Date   ABDOMINAL HYSTERECTOMY  1988   ARTERY BIOPSY Left 06/11/2022   Procedure: LEFT BIOPSY TEMPORAL ARTERY;  Surgeon: Kieth Brightly Arta Bruce, MD;  Location: [facility];  Service: General;  Laterality: Left;   CHOLECYSTECTOMY OPEN  1978   COLONOSCOPY N/A 05/31/2013   Procedure: COLONOSCOPY;  Surgeon: Wonda Horner, MD;  Location: [facility] END[facility]COPY;  Service: Endoscopy;  Laterality: N/A;   CYSTOSCOPY/RETROGRADE/URETEROSCOPY Bilateral 07/23/2017   Procedure:  CYSTOSCOPY/RETROGRADE/URETEROSCOPY;  Surgeon: Ceasar Mons, MD;  Location: [facility];  Service: Urology;  Laterality: Bilateral;   ESOPHAGOGASTRODUODENOSCOPY (EGD) WITH PROPOFOL N/A 05/11/2014   Procedure: ESOPHAGOGASTRODUODENOSCOPY (EGD) WITH PROPOFOL;  Surgeon: Lear Ng, MD;  Location: [facility];  Service: Endoscopy;  Laterality: N/A;  egd first   ESOPHAGOGASTRODUODENOSCOPY (EGD) WITH PROPOFOL N/A 05/08/2020   Procedure: ESOPHAGOGASTRODUODENOSCOPY (EGD) WITH PROPOFOL;  Surgeon: Arta Silence, MD;  Location: [facility];  Service: Endoscopy;  Laterality: N/A;   EUS N/A 06/21/2016   Procedure: ESOPHAGEAL ENDOSCOPIC ULTRASOUND (EUS) RADIAL;  Surgeon: Arta Silence, MD;  Location: [facility];  Service: Endoscopy;  Laterality: N/A;   FLEXIBLE SIGMOIDOSCOPY N/A 05/11/2014   Procedure: FLEXIBLE SIGMOIDOSCOPY;  Surgeon: Lear Ng, MD;  Location: [facility];  Service: Endoscopy;  Laterality: N/A;   LAPAROTOMY W/ BILATERAL SALPINGOOPHORECTOMY  09-26-2003   dr Matthew Saras at [place]  06-08-2003   dr Radene Knee [place] Left 12/19/2020   Procedure: TOTAL KNEE ARTHROPLASTY;  Surgeon: Paralee Cancel, MD;  Location: [facility];  Service: Orthopedics;  Laterality: Left;  70 mins   TOTAL KNEE ARTHROPLASTY Right 03/27/2021   Procedure: TOTAL KNEE ARTHROPLASTY;  Surgeon: Paralee Cancel, MD;  Location: [facility];  Service: Orthopedics;  Laterality: Right;  70 mins   TRANSTHORACIC ECHOCARDIOGRAM  04/29/2016   ef 60-65%,  grade 2 diastolic dysfunction/  trivial MR and TR   TUBAL LIGATION Bilateral yrs ago   UPPER ESOPHAGEAL ENDOSCOPIC ULTRASOUND (EUS) N/A 05/08/2020   Procedure: UPPER ESOPHAGEAL ENDOSCOPIC ULTRASOUND (EUS);  Surgeon: Arta Silence, MD;  Location: [facility];  Service: Endoscopy;  Laterality: N/A;    OB History   No obstetric history on file.      Home Medications    Prior to Admission medications    Medication Sig Start Date End Date Taking? Authorizing Provider  betamethasone dipropionate 0.05 % lotion Apply 1 Application topically 2 (two) times daily. 05/03/22   [provider]  cyclobenzaprine (FLEXERIL) 10 MG tablet Take 10 mg by mouth 3 (three) times daily as needed for muscle spasms.    [provider]  diazepam (VALIUM) 5 MG tablet Take 5 mg by mouth 2 (two) times daily as needed for anxiety.    [provider]  esomeprazole (NEXIUM) 40 MG capsule Take 40 mg by mouth daily as needed (acid reflux). 06/10/19   [provider]  FLUoxetine (PROZAC) 20 MG capsule Take 20 mg by mouth daily.     [provider]  furosemide (LASIX) 20 MG tablet Take 20 mg by mouth daily.    [provider]  Multiple Vitamins-Minerals (WOMENS MULTI PO) Take 1 tablet by mouth daily.    [provider]  naloxone [facility]) nasal spray 4 mg/0.1  mL Place 1 spray into the nose daily as needed (opioid overdose).    [provider]  Oxycodone HCl 10 MG TABS Take 2 tablets (20 mg total) by mouth every 4 (four) hours as needed. Patient taking differently: Take 10-20 mg by mouth every 4 (four) hours as needed. 03/28/21   Irving Copas, PA-C  Polyethyl Glycol-Propyl Glycol (SYSTANE OP) Place 1 drop into both eyes 2 (two) times daily as needed (dry eyes).    [provider]  polyethylene glycol (MIRALAX / GLYCOLAX) 17 g packet Take 17 g by mouth 2 (two) times daily. Patient taking differently: Take 17 g by mouth daily as needed for mild constipation. 12/20/20   Danae Orleans, PA-C  rosuvastatin (CRESTOR) 10 MG tablet Take 10 mg by mouth every Monday.    [provider]  SUMAtriptan (IMITREX) 50 MG tablet Take 1 tablet (50 mg total) by mouth every 2 (two) hours as needed for migraine. May repeat in 2 hours if headache persists or recurs. Patient not taking: Reported on 05/31/2022 04/15/22   Marcial Pacas, MD  traMADol (ULTRAM) 50 MG tablet  Take 50 mg by mouth 3 (three) times daily as needed for mild pain. 03/14/22   [provider]  traZODone (DESYREL) 50 MG tablet TAKE 3 TABLETS BY MOUTH AT BEDTIME. Patient not taking: Reported on 05/31/2022 05/21/22   Marcial Pacas, MD    Family History Family History  Problem Relation Age of Onset   Alzheimer's disease Mother    Hypertension Mother    Colon cancer Brother    Hypertension Brother    Other Father        ruptured appendix   Breast cancer Sister    Hypertension Sister    Hypertension Sister    Memory loss Brother    Hypertension Sister    Heart disease Sister     Social History Social History   Tobacco Use   Smoking status: Never   Smokeless tobacco: Never  Vaping Use   Vaping Use: Never used  Substance Use Topics   Alcohol use: Not Currently    Comment: occ   Drug use: No     Allergies   Latex, Nsaids, Wellbutrin [bupropion hcl], Aspirin, Darvocet [propoxyphene n-acetaminophen], Tape, Toradol [ketorolac tromethamine], Tylenol [acetaminophen], Butalbital-apap-caffeine, Ciprofloxacin, Eszopiclone, Milk-related compounds, Other, and Topiramate   Review of Systems Review of Systems   Physical Exam Triage Vital Signs ED Triage Vitals  Enc Vitals Group     BP 06/13/22 1429 121/79     Pulse Rate 06/13/22 1429 (!) 102     Resp 06/13/22 1429 18     Temp 06/13/22 1429 98 F (36.7 C)     Temp Source 06/13/22 1429 Oral     SpO2 06/13/22 1429 98 %     Weight --      Height --      Head Circumference --      Peak Flow --      Pain Score 06/13/22 1433 7     Pain Loc --      Pain Edu? --      Excl. in [place]? --    No data found.  Updated Vital Signs BP 121/79 (BP Location: Left Arm)   Pulse (!) 102   Temp 98 F (36.7 C) (Oral)   Resp 18   SpO2 98%   Visual Acuity Right Eye Distance:   Left Eye Distance:   Bilateral Distance:    Right Eye Near:  Left Eye Near:    Bilateral Near:     Physical Exam Vitals reviewed.  Constitutional:       General: She is not in acute distress.    Appearance: She is not ill-appearing, toxic-appearing or diaphoretic.  HENT:     Head:     Comments: She has a bandage with an OpSite over it just in front of her left ear.  The bandage is removed and the site is not bleeding anymore.  There is also no swelling there nor any induration.  There is no redness.  There is no drainage of any purulent material Cardiovascular:     Rate and Rhythm: Normal rate and regular rhythm.     Heart sounds: No murmur heard. Pulmonary:     Effort: Pulmonary effort is normal.     Breath sounds: Normal breath sounds.  Skin:    Coloration: Skin is not pale.  Neurological:     General: No focal deficit present.     Mental Status: She is alert and oriented to person, place, and time.  Psychiatric:        Behavior: Behavior normal.      UC Treatments / Results  Labs (all labs ordered are listed, but only abnormal results are displayed) Labs Reviewed - No data to display  EKG   Radiology No results found.  Procedures Procedures (including critical Wolf time)  Medications Ordered in UC Medications - No data to display  Initial Impression / Assessment and Plan / UC Course  I have reviewed the triage vital signs and the nursing notes.  Pertinent labs & imaging results that were available during my Wolf of the patient were reviewed by me and considered in my medical decision making (see chart for details).     Since her headache that she is having is like her usual headaches, and since there is no measured fever here today, and the exam is normal of the biopsy site once we got the bandage off, I am not going to send her to the emergency room.  It is rebandaged today, and she is getting keep it bandaged for the next 2 days as instructed by her surgeon who had done the biopsy.  If she has any more fever or the headache worsens or she has any other concerning symptoms, she will present to the emergency room  where they can do advanced imaging  She declined a Toradol shot-- she states it makes her colitis act Final Clinical Impressions(s) / UC Diagnoses   Final diagnoses:  Bleeding from wound  Nonintractable headache, unspecified chronicity pattern, unspecified headache type     Discharge Instructions      Keep your biopsy spot bandaged for the next 2 days.  Get seen on an urgent basis if it starts bleeding again, or if your headache worsens, or if you continue to have any measured fever      ED Prescriptions   None    PDMP not reviewed this encounter.   Barrett Henle, MD 06/13/22 1755

## 2022-06-13 NOTE — Discharge Instructions (Addendum)
Keep your biopsy spot bandaged for the next 2 days.  Get seen on an urgent basis if it starts bleeding again, or if your headache worsens, or if you continue to have any measured fever

## 2022-06-13 NOTE — ED Triage Notes (Signed)
Pt presents for wound check; pt had a biopsy surgery procedure done to left side of her head X 2 days ago and woke up ith it bleeding profusely.

## 2022-06-19 DIAGNOSIS — Z0000 Encounter for general adult medical examination without abnormal findings: Secondary | ICD-10-CM | POA: Diagnosis not present

## 2022-06-19 DIAGNOSIS — Z6839 Body mass index (BMI) 39.0-39.9, adult: Secondary | ICD-10-CM | POA: Diagnosis not present

## 2022-06-19 DIAGNOSIS — I700 Atherosclerosis of aorta: Secondary | ICD-10-CM | POA: Diagnosis not present

## 2022-06-19 DIAGNOSIS — D8481 Immunodeficiency due to conditions classified elsewhere: Secondary | ICD-10-CM | POA: Diagnosis not present

## 2022-06-19 DIAGNOSIS — Z23 Encounter for immunization: Secondary | ICD-10-CM | POA: Diagnosis not present

## 2022-06-19 DIAGNOSIS — Z0001 Encounter for general adult medical examination with abnormal findings: Secondary | ICD-10-CM | POA: Diagnosis not present

## 2022-06-19 DIAGNOSIS — I10 Essential (primary) hypertension: Secondary | ICD-10-CM | POA: Diagnosis not present

## 2022-06-19 DIAGNOSIS — G43909 Migraine, unspecified, not intractable, without status migrainosus: Secondary | ICD-10-CM | POA: Diagnosis not present

## 2022-09-01 DIAGNOSIS — M25562 Pain in left knee: Secondary | ICD-10-CM | POA: Insufficient documentation

## 2022-10-01 ENCOUNTER — Emergency Department (HOSPITAL_BASED_OUTPATIENT_CLINIC_OR_DEPARTMENT_OTHER): Payer: Medicare HMO

## 2022-10-01 ENCOUNTER — Other Ambulatory Visit: Payer: Self-pay

## 2022-10-01 ENCOUNTER — Encounter (HOSPITAL_BASED_OUTPATIENT_CLINIC_OR_DEPARTMENT_OTHER): Payer: Self-pay

## 2022-10-01 ENCOUNTER — Emergency Department (HOSPITAL_BASED_OUTPATIENT_CLINIC_OR_DEPARTMENT_OTHER)
Admission: EM | Admit: 2022-10-01 | Discharge: 2022-10-01 | Disposition: A | Discharge status: Home / Self Care | Payer: Medicare HMO | Attending: Emergency Medicine | Admitting: Emergency Medicine

## 2022-10-01 DIAGNOSIS — I10 Essential (primary) hypertension: Secondary | ICD-10-CM | POA: Insufficient documentation

## 2022-10-01 DIAGNOSIS — E119 Type 2 diabetes mellitus without complications: Secondary | ICD-10-CM | POA: Insufficient documentation

## 2022-10-01 DIAGNOSIS — R109 Unspecified abdominal pain: Secondary | ICD-10-CM | POA: Diagnosis present

## 2022-10-01 DIAGNOSIS — R1084 Generalized abdominal pain: Secondary | ICD-10-CM

## 2022-10-01 DIAGNOSIS — R1032 Left lower quadrant pain: Secondary | ICD-10-CM | POA: Diagnosis not present

## 2022-10-01 DIAGNOSIS — M25552 Pain in left hip: Secondary | ICD-10-CM

## 2022-10-01 LAB — CBC
HCT: 40.9 % (ref 36.0–46.0)
Hemoglobin: 12.6 g/dL (ref 12.0–15.0)
MCH: 25.6 pg — ABNORMAL LOW (ref 26.0–34.0)
MCHC: 30.8 g/dL (ref 30.0–36.0)
MCV: 83 fL (ref 80.0–100.0)
Platelets: 336 10*3/uL (ref 150–400)
RBC: 4.93 MIL/uL (ref 3.87–5.11)
RDW: 15.6 % — ABNORMAL HIGH (ref 11.5–15.5)
WBC: 5.2 10*3/uL (ref 4.0–10.5)
nRBC: 0 % (ref 0.0–0.2)

## 2022-10-01 LAB — COMPREHENSIVE METABOLIC PANEL
ALT: 41 U/L (ref 0–44)
AST: 25 U/L (ref 15–41)
Albumin: 3.5 g/dL (ref 3.5–5.0)
Alkaline Phosphatase: 107 U/L (ref 38–126)
Anion gap: 7 (ref 5–15)
BUN: 13 mg/dL (ref 8–23)
CO2: 24 mmol/L (ref 22–32)
Calcium: 9 mg/dL (ref 8.9–10.3)
Chloride: 106 mmol/L (ref 98–111)
Creatinine, Ser: 0.57 mg/dL (ref 0.44–1.00)
GFR, Estimated: >60 mL/min (ref >60)
Glucose, Bld: 125 mg/dL — ABNORMAL HIGH (ref 70–99)
Potassium: 4 mmol/L (ref 3.5–5.1)
Sodium: 137 mmol/L (ref 135–145)
Total Bilirubin: 0.5 mg/dL (ref 0.3–1.2)
Total Protein: 8 g/dL (ref 6.5–8.1)

## 2022-10-01 LAB — URINALYSIS, ROUTINE W REFLEX MICROSCOPIC
Bilirubin Urine: NEGATIVE
Glucose, UA: NEGATIVE mg/dL
Ketones, ur: NEGATIVE mg/dL
Leukocytes,Ua: NEGATIVE
Nitrite: NEGATIVE
Protein, ur: 30 mg/dL — AB
Specific Gravity, Urine: >=1.03 (ref 1.005–1.030)
pH: 5.5 (ref 5.0–8.0)

## 2022-10-01 LAB — URINALYSIS, MICROSCOPIC (REFLEX): RBC / HPF: >50 RBC/hpf (ref 0–5)

## 2022-10-01 LAB — LIPASE, BLOOD: Lipase: 26 U/L (ref 11–51)

## 2022-10-01 MED ORDER — HYDROMORPHONE HCL 1 MG/ML IJ SOLN
1.0000 mg | Freq: Once | INTRAMUSCULAR | Status: AC
  Administered 2022-10-01: 1 mg via INTRAVENOUS
  Filled 2022-10-01: qty 1

## 2022-10-01 MED ORDER — ONDANSETRON HCL 4 MG/2ML IJ SOLN
4.0000 mg | Freq: Once | INTRAMUSCULAR | Status: AC
  Administered 2022-10-01: 4 mg via INTRAVENOUS
  Filled 2022-10-01: qty 2

## 2022-10-01 MED ORDER — IOHEXOL 300 MG/ML  SOLN
125.0000 mL | Freq: Once | INTRAMUSCULAR | Status: AC | PRN
  Administered 2022-10-01: 125 mL via INTRAVENOUS

## 2022-10-01 MED ORDER — OXYCODONE HCL 5 MG PO TABS
10.0000 mg | ORAL_TABLET | Freq: Once | ORAL | Status: AC
  Administered 2022-10-01: 10 mg via ORAL
  Filled 2022-10-01: qty 2

## 2022-10-01 NOTE — Discharge Instructions (Signed)
Your x-ray of your hip is negative.  CT scan shows no evidence of colitis.  You do have stable dilation of your liver and pancreas ducts which is common after your cholecystectomy.  No acute findings on CT scan today.  Follow-up with your primary doctor for further evaluation as well as orthopedic doctor.  Emergency department cannot prescribe chronic pain medications unfortunately.  Return to the ED with new or worsening symptoms.

## 2022-10-01 NOTE — ED Provider Notes (Signed)
Care assumed from Dr. Maryan Rued.  Patient here with left-sided hip and abdominal pain with history of chronic left hip pain as well as colitis on chronic prednisone.  She is out of her home pain medication.  X-ray of hip is negative.  CT scan pending for further evaluation of her abdominal pain as well as hip pain.  CT scan shows no acute findings. There is stable dilation of intrahepatic and intrahepatic ducts.  Mildly increased pancreatic duct dilation.  Left adrenal myolipoma.  No acute bony pathology.  Patient able to ambulate without assistance.  Instructed to follow-up with her PCP as well as orthopedics.  Discussed that emergency department cannot provide chronic pain medications.  Return precautions discussed   Ezequiel Essex, MD 10/01/22 1655

## 2022-10-01 NOTE — ED Notes (Addendum)
Patient transported to X-ray 

## 2022-10-01 NOTE — ED Provider Notes (Signed)
[place] [facility] DEPARTMENT Provider Note   CSN: [id number] Arrival date & time: 10/01/22  1234     History  Chief Complaint  Patient presents with   Abdominal Pain    Erin Wolf is a 66 y.o. female.  Patient is a 66 year old female with a history of ulcerative colitis, lupus, GERD, chronic abdominal pain, hypertension, hematuria, seizure disorder, diabetes, known left sciatic nerve issues and hip issues that she is seeing Dr. Alvan Dame for next month for possible replacement who is presenting today with complaints left side pain in her abdomen and in her hip.  Patient reports this has been going on for a while but is worsened since last Thursday.  She had gotten up after falling asleep and was a little disoriented causing her to fall onto her left hip.  Since that time the pain has been significantly worse.  She also feels that the pain is worsening her typical colitis pain.  She has had some intermittent vomiting but denies any fever.  She reports the pain is so bad now she has a hard time even lifting her leg.  The pain does not radiate down to her toes but does radiate down to her knee.  She has had prior knee replacement but reports her knee feels fine.  She does take oxycodone daily for chronic pain and reports that since she fell on Thursday she is taking 4 to 6 tablets/day which her husband regulates as well is taking tramadol but it is not helping.  She is due for refill of her pain medication on Sunday but knows that the pills will not make it that long because she has had to increase them due to the new acute pain.  She did call her doctor yesterday and they told her they would call her back.  However because the pain was so uncomfortable she came here for further evaluation.  During the fall she denies hitting her head or loss of consciousness.  She has noticed a little bit of blood when she wipes after using the bathroom but denies dysuria, frequency, urgency, vaginal  discharge or irritation.  The history is provided by the patient and medical records.  Abdominal Pain      Home Medications Prior to Admission medications   Medication Sig Start Date End Date Taking? Authorizing Provider  betamethasone dipropionate 0.05 % lotion Apply 1 Application topically 2 (two) times daily. 05/03/22   [provider]  cyclobenzaprine (FLEXERIL) 10 MG tablet Take 10 mg by mouth 3 (three) times daily as needed for muscle spasms.    [provider]  diazepam (VALIUM) 5 MG tablet Take 5 mg by mouth 2 (two) times daily as needed for anxiety.    [provider]  esomeprazole (NEXIUM) 40 MG capsule Take 40 mg by mouth daily as needed (acid reflux). 06/10/19   [provider]  FLUoxetine (PROZAC) 20 MG capsule Take 20 mg by mouth daily.     [provider]  furosemide (LASIX) 20 MG tablet Take 20 mg by mouth daily.    [provider]  Multiple Vitamins-Minerals (WOMENS MULTI PO) Take 1 tablet by mouth daily.    [provider]  naloxone [facility]) nasal spray 4 mg/0.1 mL Place 1 spray into the nose daily as needed (opioid overdose).    [provider]  Oxycodone HCl 10 MG TABS Take 2 tablets (20 mg total) by mouth every 4 (four) hours as needed. Patient taking differently: Take 10-20  mg by mouth every 4 (four) hours as needed. 03/28/21   Irving Copas, PA-C  Polyethyl Glycol-Propyl Glycol (SYSTANE OP) Place 1 drop into both eyes 2 (two) times daily as needed (dry eyes).    [provider]  polyethylene glycol (MIRALAX / GLYCOLAX) 17 g packet Take 17 g by mouth 2 (two) times daily. Patient taking differently: Take 17 g by mouth daily as needed for mild constipation. 12/20/20   Danae Orleans, PA-C  rosuvastatin (CRESTOR) 10 MG tablet Take 10 mg by mouth every Monday.    [provider]  SUMAtriptan (IMITREX) 50 MG tablet Take 1 tablet (50 mg total) by mouth every 2 (two) hours as needed for  migraine. May repeat in 2 hours if headache persists or recurs. Patient not taking: Reported on 05/31/2022 04/15/22   Marcial Pacas, MD  traMADol (ULTRAM) 50 MG tablet Take 50 mg by mouth 3 (three) times daily as needed for mild pain. 03/14/22   [provider]  traZODone (DESYREL) 50 MG tablet TAKE 3 TABLETS BY MOUTH AT BEDTIME. Patient not taking: Reported on 05/31/2022 05/21/22   Marcial Pacas, MD      Allergies    Latex, Nsaids, Wellbutrin [bupropion hcl], Aspirin, Darvocet [propoxyphene n-acetaminophen], Tape, Toradol [ketorolac tromethamine], Tylenol [acetaminophen], Butalbital-apap-caffeine, Ciprofloxacin, Eszopiclone, Milk-related compounds, Other, and Topiramate    Review of Systems   Review of Systems  Gastrointestinal:  Positive for abdominal pain.    Physical Exam Updated Vital Signs BP (!) 142/93   Pulse 82   Temp 98 F (36.7 C) (Oral)   Resp 17   Ht '5\' 5"'$  (1.651 m)   Wt 104.3 kg   SpO2 100%   BMI 38.27 kg/m  Physical Exam Vitals and nursing note reviewed.  Constitutional:      General: She is not in acute distress.    Appearance: She is well-developed.  HENT:     Head: Normocephalic and atraumatic.  Eyes:     Pupils: Pupils are equal, round, and reactive to light.  Cardiovascular:     Rate and Rhythm: Normal rate and regular rhythm.     Heart sounds: Normal heart sounds. No murmur heard.    No friction rub.  Pulmonary:     Effort: Pulmonary effort is normal.     Breath sounds: Normal breath sounds. No wheezing or rales.  Abdominal:     General: Bowel sounds are normal. There is no distension.     Palpations: Abdomen is soft.     Tenderness: There is abdominal tenderness in the left lower quadrant. There is no guarding or rebound.  Musculoskeletal:        General: Tenderness present.     Left hip: Tenderness and bony tenderness present. Decreased range of motion.     Comments: No edema.  No rashes present on the skin but pain with palpation of the lateral  hip as well as the medial groin area.  No palpable hernias  Skin:    General: Skin is warm and dry.     Findings: No rash.  Neurological:     Mental Status: She is alert and oriented to person, place, and time.     Cranial Nerves: No cranial nerve deficit.  Psychiatric:        Behavior: Behavior normal.     ED Results / Procedures / Treatments   Labs (all labs ordered are listed, but only abnormal results are displayed) Labs Reviewed  COMPREHENSIVE METABOLIC PANEL - Abnormal; Notable for the  following components:      Result Value   Glucose, Bld 125 (*)    All other components within normal limits  CBC - Abnormal; Notable for the following components:   MCH 25.6 (*)    RDW 15.6 (*)    All other components within normal limits  URINALYSIS, ROUTINE W REFLEX MICROSCOPIC - Abnormal; Notable for the following components:   APPearance TURBID (*)    Hgb urine dipstick LARGE (*)    Protein, ur 30 (*)    All other components within normal limits  URINALYSIS, MICROSCOPIC (REFLEX) - Abnormal; Notable for the following components:   Bacteria, UA MANY (*)    All other components within normal limits  LIPASE, BLOOD    EKG None  Radiology No results found.  Procedures Procedures    Medications Ordered in ED Medications  HYDROmorphone (DILAUDID) injection 1 mg (1 mg Intravenous Given 10/01/22 1419)  ondansetron (ZOFRAN) injection 4 mg (4 mg Intravenous Given 10/01/22 1419)    ED Course/ Medical Decision Making/ A&P                           Medical Decision Making Amount and/or Complexity of Data Reviewed Labs: ordered. Radiology: ordered.  Risk Prescription drug management.   Pt with multiple medical problems and comorbidities and presenting today with a complaint that caries a high risk for morbidity and mortality.  Here today with complaints of left-sided hip and abdominal pain.  Patient has known history of colitis which she is currently taking prednisone for but  also has known history of left hip arthritis waiting to follow-up with Dr. Ihor Gully for potential replacement.  Symptoms worsened after a fall on Thursday.  Patient is also running low on her pain medication and is waiting to hear from her PCP about calling an additional medication.  Low suspicion at this time for septic hip and no findings suggestive of shingles.  Hemodynamically stable here.  Concern for possible acute hip pathology versus exacerbation of her chronic hip pain due to the fall versus exacerbation of her colitis.  Patient does have blood in her urine today greater than 50 red cells but only 6-10 white cells and many bacteria with epithelial cells present which seems to be typical of her UAs.  She denies any urinary symptoms at this time and lower suspicion for UTI as a cause of her pain.  However possibility for kidney stone as she does have a history of that as well.  Will get plain films of the hip.  I independently interpreted patient's labs today and CBC, CMP, lipase are within normal limits.  Will get CT of abd to further eval. patient given pain control.  I have independently visualized and interpreted pt's images today.  Hip x-ray without obvious fracture.          Final Clinical Impression(s) / ED Diagnoses Final diagnoses:  None    Rx / DC Orders ED Discharge Orders     None         Blanchie Dessert, MD 10/01/22 [phone]

## 2022-10-01 NOTE — ED Triage Notes (Signed)
Pt reports left groin pain for 2-3 weeks. Intensity has increased. Noticed blood in urine yesterday. Nauseated

## 2022-10-01 NOTE — ED Notes (Signed)
Attempted IV x 2. Unable to thread IV without difficulty.

## 2022-10-01 NOTE — ED Notes (Signed)
ED Provider at bedside. 

## 2022-10-01 NOTE — ED Notes (Signed)
Pt ambulated around the nurse's station O2 was 97-100% and HR was 90-95. Pt denies any dizziness or weakness stated she felt happy and had no pain.

## 2022-10-24 DIAGNOSIS — M25552 Pain in left hip: Secondary | ICD-10-CM | POA: Insufficient documentation

## 2022-10-31 DIAGNOSIS — M1612 Unilateral primary osteoarthritis, left hip: Secondary | ICD-10-CM | POA: Insufficient documentation

## 2022-11-25 NOTE — Patient Instructions (Signed)
SURGICAL WAITING ROOM VISITATION  Patients having surgery or a procedure may have no more than 2 support people in the waiting area - these visitors may rotate.    Children under the age of 9 must have an adult with them who is not the patient.  Due to an increase in RSV and influenza rates and associated hospitalizations, children ages 56 and under may not visit patients in [place].  If the patient needs to stay at the hospital during part of their recovery, the visitor guidelines for inpatient rooms apply. Pre-op nurse will coordinate an appropriate time for 1 support person to accompany patient in pre-op.  This support person may not rotate.    Please refer to the [facility] website for the visitor guidelines for Inpatients (after your surgery is over and you are in a regular room).       Your procedure is scheduled on:  12/03/22    Report to [facility] Main Entrance    Report to admitting at   [phone]   Call this number if you have problems the morning of surgery [phone]   Do not eat food :After Midnight.   After Midnight you may have the following liquids until _ 0530_____ AM DAY OF SURGERY  Water Non-Citrus Juices (without pulp, NO RED-Apple, White grape, White cranberry) Black Coffee (NO MILK/CREAM OR CREAMERS, sugar ok)  Clear Tea (NO MILK/CREAM OR CREAMERS, sugar ok) regular and decaf                             Plain Jell-O (NO RED)                                           Fruit ices (not with fruit pulp, NO RED)                                     Popsicles (NO RED)                                                               Sports drinks like Gatorade (NO RED)                      The day of surgery:  Drink ONE (1) Pre-Surgery Clear Ensure or G2 at   0530AM ( have completed by )  the morning of surgery. Drink in one sitting. Do not sip.  This drink was given to you during your hospital  pre-op appointment visit. Nothing else to  drink after completing the  Pre-Surgery Clear Ensure or G2.          If you have questions, please contact your surgeon's office.    Oral Hygiene is also important to reduce your risk of infection.                                    Remember - BRUSH YOUR TEETH THE MORNING OF SURGERY WITH YOUR  REGULAR TOOTHPASTE  DENTURES WILL BE REMOVED PRIOR TO SURGERY PLEASE DO NOT APPLY "Poly grip" OR ADHESIVES!!!   Do NOT smoke after Midnight   Take these medicines the morning of surgery with A SIP OF WATER:  prozac   DO NOT TAKE ANY ORAL DIABETIC MEDICATIONS DAY OF YOUR SURGERY  Bring CPAP mask and tubing day of surgery.                              You may not have any metal on your body including hair pins, jewelry, and body piercing             Do not wear make-up, lotions, powders, perfumes/cologne, or deodorant  Do not wear nail polish including gel and S&S, artificial/acrylic nails, or any other type of covering on natural nails including finger and toenails. If you have artificial nails, gel coating, etc. that needs to be removed by a nail salon please have this removed prior to surgery or surgery may need to be canceled/ delayed if the surgeon/ anesthesia feels like they are unable to be safely monitored.   Do not shave  48 hours prior to surgery.               Men may shave face and neck.   Do not bring valuables to the hospital. [place].   Contacts, glasses, dentures or bridgework may not be worn into surgery.   Bring small overnight bag day of surgery.   DO NOT [place]. PHARMACY WILL DISPENSE MEDICATIONS LISTED ON YOUR MEDICATION LIST TO YOU DURING YOUR ADMISSION [place]!    Patients discharged on the day of surgery will not be allowed to drive home.  Someone NEEDS to stay with you for the first 24 hours after anesthesia.   Special Instructions: Bring a copy of your healthcare power  of attorney and living will documents the day of surgery if you haven't scanned them before.              Please read over the following fact sheets you were given: IF [place] [phone]   If you received a COVID test during your pre-op visit  it is requested that you wear a mask when out in public, stay away from anyone that may not be feeling well and notify your surgeon if you develop symptoms. If you test positive for Covid or have been in contact with anyone that has tested positive in the last 10 days please notify you surgeon.    [place] - Preparing for Surgery Before surgery, you can play an important role.  Because skin is not sterile, your skin needs to be as free of germs as possible.  You can reduce the number of germs on your skin by washing with CHG (chlorahexidine gluconate) soap before surgery.  CHG is an antiseptic cleaner which kills germs and bonds with the skin to continue killing germs even after washing. Please DO NOT use if you have an allergy to CHG or antibacterial soaps.  If your skin becomes reddened/irritated stop using the CHG and inform your nurse when you arrive at Short Stay. Do not shave (including legs and underarms) for at least 48 hours prior to the first CHG shower.  You may shave your face/neck. Please follow these instructions carefully:  1.  Shower with CHG Soap the night before surgery and the  morning of Surgery.  2.  If you choose to wash your hair, wash your hair first as usual with your  normal  shampoo.  3.  After you shampoo, rinse your hair and body thoroughly to remove the  shampoo.                           4.  Use CHG as you would any other liquid soap.  You can apply chg directly  to the skin and wash                       Gently with a scrungie or clean washcloth.  5.  Apply the CHG Soap to your body ONLY FROM THE NECK DOWN.   Do not use on face/ open                           Wound or  open sores. Avoid contact with eyes, ears mouth and genitals (private parts).                       Wash face,  Genitals (private parts) with your normal soap.             6.  Wash thoroughly, paying special attention to the area where your surgery  will be performed.  7.  Thoroughly rinse your body with warm water from the neck down.  8.  DO NOT shower/wash with your normal soap after using and rinsing off  the CHG Soap.                9.  Pat yourself dry with a clean towel.            10.  Wear clean pajamas.            11.  Place clean sheets on your bed the night of your first shower and do not  sleep with pets. Day of Surgery : Do not apply any lotions/deodorants the morning of surgery.  Please wear clean clothes to the hospital/surgery center.  FAILURE TO FOLLOW THESE INSTRUCTIONS MAY RESULT IN THE CANCELLATION OF YOUR SURGERY PATIENT SIGNATURE_________________________________  NURSE SIGNATURE__________________________________  ________________________________________________________________________

## 2022-11-25 NOTE — Progress Notes (Signed)
Anesthesia Review:  PCP: Cardiologist : Chest x-ray : 1v- 03/14/22  EKG : 03/20/22  Echo : 2017  Stress test: Cardiac Cath :  Activity level:  Sleep Study/ CPAP : Fasting Blood Sugar :      / Checks Blood Sugar -- times a day:   Blood Thinner/ Instructions /Last Dose: ASA / Instructions/ Last Dose :    DM- type  Hgba1c-

## 2022-11-26 ENCOUNTER — Encounter (HOSPITAL_COMMUNITY)
Admission: RE | Admit: 2022-11-26 | Discharge: 2022-11-26 | Disposition: A | Discharge status: Home / Self Care | Payer: Medicare HMO | Source: Ambulatory Visit | Attending: Orthopedic Surgery | Admitting: Orthopedic Surgery

## 2022-11-26 ENCOUNTER — Encounter (HOSPITAL_COMMUNITY): Payer: Self-pay

## 2022-11-26 ENCOUNTER — Other Ambulatory Visit: Payer: Self-pay

## 2022-11-26 VITALS — BP 140/78 | HR 89 | Temp 98.5°F | Resp 16 | Ht 65.0 in | Wt 225.0 lb

## 2022-11-26 DIAGNOSIS — Z01812 Encounter for preprocedural laboratory examination: Secondary | ICD-10-CM | POA: Diagnosis present

## 2022-11-26 DIAGNOSIS — M1612 Unilateral primary osteoarthritis, left hip: Secondary | ICD-10-CM

## 2022-11-26 DIAGNOSIS — Z01818 Encounter for other preprocedural examination: Secondary | ICD-10-CM

## 2022-11-26 DIAGNOSIS — E119 Type 2 diabetes mellitus without complications: Secondary | ICD-10-CM | POA: Diagnosis not present

## 2022-11-26 HISTORY — DX: Dyspnea, unspecified: R06.00

## 2022-11-26 LAB — COMPREHENSIVE METABOLIC PANEL
ALT: 51 U/L — ABNORMAL HIGH (ref 0–44)
AST: 61 U/L — ABNORMAL HIGH (ref 15–41)
Albumin: 3.9 g/dL (ref 3.5–5.0)
Alkaline Phosphatase: 108 U/L (ref 38–126)
Anion gap: 12 (ref 5–15)
BUN: 19 mg/dL (ref 8–23)
CO2: 23 mmol/L (ref 22–32)
Calcium: 8.9 mg/dL (ref 8.9–10.3)
Chloride: 98 mmol/L (ref 98–111)
Creatinine, Ser: 0.73 mg/dL (ref 0.44–1.00)
GFR, Estimated: >60 mL/min (ref >60)
Glucose, Bld: 101 mg/dL — ABNORMAL HIGH (ref 70–99)
Potassium: 5.3 mmol/L — ABNORMAL HIGH (ref 3.5–5.1)
Sodium: 133 mmol/L — ABNORMAL LOW (ref 135–145)
Total Bilirubin: 0.8 mg/dL (ref 0.3–1.2)
Total Protein: 8.6 g/dL — ABNORMAL HIGH (ref 6.5–8.1)

## 2022-11-26 LAB — CBC
HCT: 44.8 % (ref 36.0–46.0)
Hemoglobin: 13.6 g/dL (ref 12.0–15.0)
MCH: 25.8 pg — ABNORMAL LOW (ref 26.0–34.0)
MCHC: 30.4 g/dL (ref 30.0–36.0)
MCV: 84.8 fL (ref 80.0–100.0)
Platelets: 285 10*3/uL (ref 150–400)
RBC: 5.28 MIL/uL — ABNORMAL HIGH (ref 3.87–5.11)
RDW: 15.1 % (ref 11.5–15.5)
WBC: 5.5 10*3/uL (ref 4.0–10.5)
nRBC: 0 % (ref 0.0–0.2)

## 2022-11-26 LAB — GLUCOSE, CAPILLARY: Glucose-Capillary: 114 mg/dL — ABNORMAL HIGH (ref 70–99)

## 2022-11-26 LAB — SURGICAL PCR SCREEN
MRSA, PCR: NEGATIVE
Staphylococcus aureus: POSITIVE — AB

## 2022-11-26 LAB — HEMOGLOBIN A1C
Hgb A1c MFr Bld: 6.4 % — ABNORMAL HIGH (ref 4.8–5.6)
Mean Plasma Glucose: 136.98 mg/dL

## 2022-12-02 NOTE — H&P (Signed)
TOTAL HIP ADMISSION H&P  Patient is admitted for left total hip arthroplasty.  Subjective:  Chief Complaint: left hip pain  HPI: Erin Wolf, 67 y.o. female, has a history of pain and functional disability in the left hip(s) due to arthritis and patient has failed non-surgical conservative treatments for greater than 12 weeks to include NSAID's and/or analgesics and activity modification.  Onset of symptoms was gradual starting 2 years ago with gradually worsening course since that time.The patient noted no past surgery on the left hip(s).  Patient currently rates pain in the left hip at 8 out of 10 with activity. Patient has worsening of pain with activity and weight bearing, pain that interfers with activities of daily living, and pain with passive range of motion. Patient has evidence of joint space narrowing by imaging studies. This condition presents safety issues increasing the risk of falls. There is no current active infection.  Patient Active Problem List   Diagnosis Date Noted   Abnormal laboratory test result 04/16/2022   Chronic migraine w/o aura w/o status migrainosus, not intractable 04/15/2022   Chronic insomnia 04/15/2022   S/P total knee arthroplasty, right 03/27/2021   Osteoarthritis of left knee 12/19/2020   Status post total left knee replacement 12/19/2020   Chronic abdominal pain 05/05/2020   Chronic pain of left knee    Chronic migraine 11/10/2018   Nausea & vomiting 10/18/2016   History of ulcerative colitis    LLQ abdominal pain 10/17/2016   Syncope and collapse 06/29/2016   Left-sided weakness 06/29/2016   Facial twitching 06/29/2016   IBS (irritable bowel syndrome) 06/29/2016   Faintness    Dehydration 05/09/2014   GERD (gastroesophageal reflux disease) 05/09/2014   Gastroenteritis 05/09/2014   Chest pain, atypical 05/09/2014   Chronic ulcerative colitis ([place]) 05/09/2014   Acute gastroenteritis 05/09/2014   Ulcerative colitis ([place]) 05/27/2013    Hypokalemia 05/27/2013   Anemia 01/04/2012   Transaminitis 01/04/2012   Diarrhea 01/04/2012   Chest pain 01/03/2012   SOB (shortness of breath) 01/03/2012   HTN (hypertension), benign 01/03/2012   Hx of migraines 01/03/2012   Seizure ([place]) 01/03/2012   Depression 01/03/2012   Ulcerative colitis 01/03/2012   Chronic back pain 01/03/2012   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   Hyperlipidemia 01/03/2012   Hepatitis C 01/03/2012   Fibroids 01/03/2012   Past Medical History:  Diagnosis Date   Anxiety    Arthritis    Chronic abdominal pain    Chronic lower back pain    Chronic nausea    Depression    Diabetes mellitus without complication ([place])    not on meds   Diverticulosis    Dyspnea    with exertion   Frequency of urination    GERD (gastroesophageal reflux disease)    Headache    Hematuria    Hepatitis    pt denies   Hepatitis C antibody positive in blood    per pt told by pcp   History of panic attacks    History of syncope    hx recurrent syncope --- per epic domentation non-cardiac , orthostatic hypotension, anxiety, dehydration   History of uterine fibroid    HTN (hypertension), benign    Hyperlipidemia 01/03/2012   IBS (irritable bowel syndrome)    Myalgia    Sciatic nerve disease, left 2023   Seizure disorder ([place]) followed by pcp until new neurologist appr. in jan 2019   "started having them in my 20's" ,  petit mal ----  per pt  last seizure one 2015   SLE (systemic lupus erythematosus) [facility])    rheumatologist-- dr Gerilyn Nestle (consult 11-12-2016) having work-up done   Ulcerative colitis    followed by dr Penelope Coop at [facility]   Wears glasses     Past Surgical History:  Procedure Laterality Date   ABDOMINAL HYSTERECTOMY  1988   ARTERY BIOPSY Left 06/11/2022   Procedure: LEFT BIOPSY TEMPORAL ARTERY;  Surgeon: Kieth Brightly Arta Bruce, MD;  Location: [facility] ORS;  Service: General;  Laterality: Left;   CHOLECYSTECTOMY OPEN  1978   COLONOSCOPY N/A 05/31/2013   Procedure:  COLONOSCOPY;  Surgeon: Wonda Horner, MD;  Location: [facility] ENDOSCOPY;  Service: Endoscopy;  Laterality: N/A;   CYSTOSCOPY/RETROGRADE/URETEROSCOPY Bilateral 07/23/2017   Procedure: CYSTOSCOPY/RETROGRADE/URETEROSCOPY;  Surgeon: Ceasar Mons, MD;  Location: [facility];  Service: Urology;  Laterality: Bilateral;   detached retina on right eye surgery      ESOPHAGOGASTRODUODENOSCOPY (EGD) WITH PROPOFOL N/A 05/11/2014   Procedure: ESOPHAGOGASTRODUODENOSCOPY (EGD) WITH PROPOFOL;  Surgeon: Lear Ng, MD;  Location: [facility] ENDOSCOPY;  Service: Endoscopy;  Laterality: N/A;  egd first   ESOPHAGOGASTRODUODENOSCOPY (EGD) WITH PROPOFOL N/A 05/08/2020   Procedure: ESOPHAGOGASTRODUODENOSCOPY (EGD) WITH PROPOFOL;  Surgeon: Arta Silence, MD;  Location: [facility] ENDOSCOPY;  Service: Endoscopy;  Laterality: N/A;   EUS N/A 06/21/2016   Procedure: ESOPHAGEAL ENDOSCOPIC ULTRASOUND (EUS) RADIAL;  Surgeon: Arta Silence, MD;  Location: [facility] ENDOSCOPY;  Service: Endoscopy;  Laterality: N/A;   FLEXIBLE SIGMOIDOSCOPY N/A 05/11/2014   Procedure: FLEXIBLE SIGMOIDOSCOPY;  Surgeon: Lear Ng, MD;  Location: [facility] ENDOS[facility];  Service: Endoscopy;  Laterality: N/A;   LAPAROTOMY W/ BILATERAL SALPINGOOPHORECTOMY  09-26-2003   dr Matthew Saras at [place]  06-08-2003   dr Radene Knee Gracey Left 12/19/2020   Procedure: TOTAL KNEE ARTHROPLASTY;  Surgeon: Paralee Cancel, MD;  Location: [facility];  Service: Orthopedics;  Laterality: Left;  70 mins   TOTAL KNEE ARTHROPLASTY Right 03/27/2021   Procedure: TOTAL KNEE ARTHROPLASTY;  Surgeon: Paralee Cancel, MD;  Location: [facility];  Service: Orthopedics;  Laterality: Right;  70 mins   TRANSTHORACIC ECHOCARDIOGRAM  04/29/2016   ef 60-65%,  grade 2 diastolic dysfunction/  trivial MR and TR   TUBAL LIGATION Bilateral yrs ago   UPPER ESOPHAGEAL ENDOSCOPIC ULTRASOUND (EUS) N/A 05/08/2020   Procedure: UPPER ESOPHAGEAL ENDOSCOPIC  ULTRASOUND (EUS);  Surgeon: Arta Silence, MD;  Location: [facility];  Service: Endoscopy;  Laterality: N/A;    No current facility-administered medications for this encounter.   Current Outpatient Medications  Medication Sig Dispense Refill Last Dose   cyclobenzaprine (FLEXERIL) 10 MG tablet Take 10 mg by mouth 3 (three) times daily as needed for muscle spasms.      esomeprazole (NEXIUM) 40 MG capsule Take 40 mg by mouth daily as needed (acid reflux).      FLUoxetine (PROZAC) 20 MG capsule Take 20 mg by mouth daily.       furosemide (LASIX) 20 MG tablet Take 20 mg by mouth daily.      Multiple Vitamins-Minerals (WOMENS MULTI PO) Take 1 tablet by mouth daily. Centrum      naloxone (NARCAN) nasal spray 4 mg/0.1 mL Place 1 spray into the nose daily as needed (opioid overdose).      Oxycodone HCl 10 MG TABS Take 2 tablets (20 mg total) by mouth every 4 (four) hours as needed. (Patient taking differently: Take 10-20 mg by mouth every 4 (four) hours as needed (pain).)  Polyethyl Glycol-Propyl Glycol (SYSTANE OP) Place 1 drop into both eyes 2 (two) times daily as needed (dry eyes).      polyethylene glycol (MIRALAX / GLYCOLAX) 17 g packet Take 17 g by mouth 2 (two) times daily. (Patient taking differently: Take 17 g by mouth daily as needed for mild constipation.) 28 packet 0    rosuvastatin (CRESTOR) 10 MG tablet Take 10 mg by mouth every Monday.      traMADol (ULTRAM) 50 MG tablet Take 50 mg by mouth 3 (three) times daily as needed for mild pain.      betamethasone dipropionate 0.05 % lotion Apply 1 Application topically 2 (two) times daily. (Patient not taking: Reported on 11/25/2022)   Not Taking   traZODone (DESYREL) 50 MG tablet TAKE 3 TABLETS BY MOUTH AT BEDTIME. (Patient not taking: Reported on 05/31/2022) 270 tablet 3 Not Taking   Allergies  Allergen Reactions   Latex Other (See Comments)    Peels skin   Nsaids Other (See Comments)    HAS COLITIS FLARES   Wellbutrin [Bupropion Hcl]  Other (See Comments)    Insomnia and headaches   Aspirin Nausea Only   Darvocet [Propoxyphene N-Acetaminophen] Nausea And Vomiting   Tape Hives   Toradol [Ketorolac Tromethamine] Nausea And Vomiting   Tylenol [Acetaminophen] Nausea Only   Butalbital-Apap-Caffeine     stomach upset   Ciprofloxacin     stomach upset   Eszopiclone Swelling   Other     Spicy foods cause diarrhea due to colitis   Topiramate     stomach upset    Social History   Tobacco Use   Smoking status: Never   Smokeless tobacco: Never  Substance Use Topics   Alcohol use: Never    Comment: occ    Family History  Problem Relation Age of Onset   Alzheimer's disease Mother    Hypertension Mother    Colon cancer Brother    Hypertension Brother    Other Father        ruptured appendix   Breast cancer Sister    Hypertension Sister    Hypertension Sister    Memory loss Brother    Hypertension Sister    Heart disease Sister      Review of Systems  Constitutional:  Negative for chills and fever.  Respiratory:  Negative for cough and shortness of breath.   Cardiovascular:  Negative for chest pain.  Gastrointestinal:  Negative for nausea and vomiting.  Musculoskeletal:  Positive for arthralgias.     Objective:  Physical Exam Well nourished and well developed. General: Alert and oriented x3, cooperative and pleasant, no acute distress. Head: normocephalic, atraumatic, neck supple. Eyes: EOMI.  Musculoskeletal: Left hip exam: She has tenderness to palpation around the left hip She has significant pain with attempted hip flexion internal and external rotation No significant lower extremity edema, erythema or calf tenderness  Calves soft and nontender. Motor function intact in LE. Strength 5/5 LE bilaterally. Neuro: Distal pulses 2+. Sensation to light touch intact in LE.  Vital signs in last 24 hours:    Labs:   Estimated body mass index is 37.44 kg/m as calculated from the following:    Height as of 11/26/22: 5' 5"$  (1.651 m).   Weight as of 11/26/22: 102.1 kg.   Imaging Review Plain radiographs demonstrate severe degenerative joint disease of the left hip(s). The bone quality appears to be adequate for age and reported activity level.      Assessment/Plan:  End  stage arthritis, left hip(s)  The patient history, physical examination, clinical judgement of the provider and imaging studies are consistent with end stage degenerative joint disease of the left hip(s) and total hip arthroplasty is deemed medically necessary. The treatment options including medical management, injection therapy, arthroscopy and arthroplasty were discussed at length. The risks and benefits of total hip arthroplasty were presented and reviewed. The risks due to aseptic loosening, infection, stiffness, dislocation/subluxation,  thromboembolic complications and other imponderables were discussed.  The patient acknowledged the explanation, agreed to proceed with the plan and consent was signed. Patient is being admitted for inpatient treatment for surgery, pain control, PT, OT, prophylactic antibiotics, VTE prophylaxis, progressive ambulation and ADL's and discharge planning.The patient is planning to be discharged  home.  Therapy Plans: HEP Disposition: Home with husband Planned DVT Prophylaxis: aspirin 52m BID (thinks she can tolerate) DME needed: none PCP: BCletis [place] clearance received TXA: IV Allergies: NSAIDs - nausea, PCN - hives, latex - skin irritation Anesthesia Concerns: prefers general anesthesia BMI: 40.8 Last HgbA1c: 6.2%   Other: - hydromorphone, robaxin - Hx of colitis - no NSAIDs - no foley - had incontinence with prior surgery   ACostella Hatcher PA-C Orthopedic Surgery [facility] [place] ([phone]

## 2022-12-03 ENCOUNTER — Encounter (HOSPITAL_COMMUNITY): Payer: Self-pay | Admitting: Orthopedic Surgery

## 2022-12-03 ENCOUNTER — Other Ambulatory Visit: Payer: Self-pay

## 2022-12-03 ENCOUNTER — Ambulatory Visit (HOSPITAL_COMMUNITY): Payer: Medicare HMO | Admitting: Physician Assistant

## 2022-12-03 ENCOUNTER — Observation Stay (HOSPITAL_COMMUNITY)
Admission: RE | Admit: 2022-12-03 | Discharge: 2022-12-04 | Disposition: A | Discharge status: Home / Self Care | Payer: Medicare HMO | Attending: Orthopedic Surgery | Admitting: Orthopedic Surgery

## 2022-12-03 ENCOUNTER — Observation Stay (HOSPITAL_COMMUNITY): Payer: Medicare HMO

## 2022-12-03 ENCOUNTER — Ambulatory Visit (HOSPITAL_COMMUNITY): Payer: Medicare HMO

## 2022-12-03 ENCOUNTER — Ambulatory Visit (HOSPITAL_BASED_OUTPATIENT_CLINIC_OR_DEPARTMENT_OTHER): Payer: Medicare HMO | Admitting: Anesthesiology

## 2022-12-03 ENCOUNTER — Encounter (HOSPITAL_COMMUNITY)
Admission: RE | Disposition: A | Discharge status: Home / Self Care | Payer: Self-pay | Source: Home / Self Care | Attending: Orthopedic Surgery

## 2022-12-03 DIAGNOSIS — M25752 Osteophyte, left hip: Secondary | ICD-10-CM

## 2022-12-03 DIAGNOSIS — M1612 Unilateral primary osteoarthritis, left hip: Secondary | ICD-10-CM

## 2022-12-03 DIAGNOSIS — Z79899 Other long term (current) drug therapy: Secondary | ICD-10-CM | POA: Diagnosis not present

## 2022-12-03 DIAGNOSIS — Z96642 Presence of left artificial hip joint: Secondary | ICD-10-CM

## 2022-12-03 DIAGNOSIS — Z91040 Latex allergy status: Secondary | ICD-10-CM | POA: Insufficient documentation

## 2022-12-03 DIAGNOSIS — Z01818 Encounter for other preprocedural examination: Secondary | ICD-10-CM

## 2022-12-03 DIAGNOSIS — Z96653 Presence of artificial knee joint, bilateral: Secondary | ICD-10-CM | POA: Insufficient documentation

## 2022-12-03 DIAGNOSIS — E119 Type 2 diabetes mellitus without complications: Secondary | ICD-10-CM | POA: Diagnosis not present

## 2022-12-03 DIAGNOSIS — I10 Essential (primary) hypertension: Secondary | ICD-10-CM | POA: Insufficient documentation

## 2022-12-03 HISTORY — PX: TOTAL HIP ARTHROPLASTY: SHX124

## 2022-12-03 LAB — GLUCOSE, CAPILLARY
Glucose-Capillary: 135 mg/dL — ABNORMAL HIGH (ref 70–99)
Glucose-Capillary: 130 mg/dL — ABNORMAL HIGH (ref 70–99)

## 2022-12-03 LAB — TYPE AND SCREEN
ABO/RH(D): O POS
Antibody Screen: NEGATIVE

## 2022-12-03 SURGERY — ARTHROPLASTY, HIP, TOTAL, ANTERIOR APPROACH
Anesthesia: General | Site: Hip | Laterality: Left

## 2022-12-03 MED ORDER — MIDAZOLAM HCL 2 MG/2ML IJ SOLN
INTRAMUSCULAR | Status: AC
  Filled 2022-12-03: qty 2

## 2022-12-03 MED ORDER — FENTANYL CITRATE PF 50 MCG/ML IJ SOSY
25.0000 ug | PREFILLED_SYRINGE | INTRAMUSCULAR | Status: DC | PRN
  Administered 2022-12-03 (×2): 50 ug via INTRAVENOUS

## 2022-12-03 MED ORDER — BUPIVACAINE-EPINEPHRINE (PF) 0.25% -1:200000 IJ SOLN
INTRAMUSCULAR | Status: DC | PRN
  Administered 2022-12-03: 30 mL

## 2022-12-03 MED ORDER — ONDANSETRON HCL 4 MG/2ML IJ SOLN
INTRAMUSCULAR | Status: DC | PRN
  Administered 2022-12-03: 4 mg via INTRAVENOUS

## 2022-12-03 MED ORDER — HYDROMORPHONE HCL 2 MG PO TABS
ORAL_TABLET | ORAL | Status: AC
  Administered 2022-12-03: 2 mg via ORAL
  Filled 2022-12-03: qty 1

## 2022-12-03 MED ORDER — SODIUM CHLORIDE 0.9 % IV SOLN: INTRAVENOUS | Status: DC

## 2022-12-03 MED ORDER — DEXAMETHASONE SODIUM PHOSPHATE 10 MG/ML IJ SOLN
INTRAMUSCULAR | Status: AC
  Filled 2022-12-03: qty 1

## 2022-12-03 MED ORDER — FENTANYL CITRATE (PF) 100 MCG/2ML IJ SOLN
INTRAMUSCULAR | Status: AC
  Filled 2022-12-03: qty 2

## 2022-12-03 MED ORDER — ROSUVASTATIN CALCIUM 10 MG PO TABS: 10.0000 mg | ORAL_TABLET | ORAL | Status: DC

## 2022-12-03 MED ORDER — CYCLOBENZAPRINE HCL 10 MG PO TABS: 10.0000 mg | ORAL_TABLET | Freq: Three times a day (TID) | ORAL | Status: DC | PRN

## 2022-12-03 MED ORDER — METOCLOPRAMIDE HCL 5 MG PO TABS: 5.0000 mg | ORAL_TABLET | Freq: Three times a day (TID) | ORAL | Status: DC | PRN

## 2022-12-03 MED ORDER — DEXAMETHASONE SODIUM PHOSPHATE 10 MG/ML IJ SOLN: 8.0000 mg | Freq: Once | INTRAMUSCULAR | Status: DC

## 2022-12-03 MED ORDER — DOCUSATE SODIUM 100 MG PO CAPS
100.0000 mg | ORAL_CAPSULE | Freq: Two times a day (BID) | ORAL | Status: DC
  Administered 2022-12-03 – 2022-12-04 (×2): 100 mg via ORAL
  Filled 2022-12-03 (×2): qty 1

## 2022-12-03 MED ORDER — ROCURONIUM BROMIDE 10 MG/ML (PF) SYRINGE
PREFILLED_SYRINGE | INTRAVENOUS | Status: AC
  Filled 2022-12-03: qty 10

## 2022-12-03 MED ORDER — SODIUM CHLORIDE (PF) 0.9 % IJ SOLN
INTRAMUSCULAR | Status: AC
  Filled 2022-12-03: qty 30

## 2022-12-03 MED ORDER — POLYETHYLENE GLYCOL 3350 17 G PO PACK
17.0000 g | PACK | Freq: Two times a day (BID) | ORAL | Status: DC
  Administered 2022-12-03 – 2022-12-04 (×2): 17 g via ORAL
  Filled 2022-12-03 (×2): qty 1

## 2022-12-03 MED ORDER — PANTOPRAZOLE SODIUM 40 MG PO TBEC
40.0000 mg | DELAYED_RELEASE_TABLET | Freq: Every day | ORAL | Status: DC
  Administered 2022-12-04: 40 mg via ORAL
  Filled 2022-12-03: qty 1

## 2022-12-03 MED ORDER — DEXAMETHASONE SODIUM PHOSPHATE 10 MG/ML IJ SOLN
10.0000 mg | Freq: Once | INTRAMUSCULAR | Status: AC
  Administered 2022-12-04: 10 mg via INTRAVENOUS
  Filled 2022-12-03: qty 1

## 2022-12-03 MED ORDER — ACETAMINOPHEN 10 MG/ML IV SOLN
INTRAVENOUS | Status: AC
  Filled 2022-12-03: qty 100

## 2022-12-03 MED ORDER — ORAL CARE MOUTH RINSE: 15.0000 mL | Freq: Once | OROMUCOSAL | Status: AC

## 2022-12-03 MED ORDER — TRANEXAMIC ACID-NACL 1000-0.7 MG/100ML-% IV SOLN
1000.0000 mg | Freq: Once | INTRAVENOUS | Status: AC
  Administered 2022-12-03: 1000 mg via INTRAVENOUS
  Filled 2022-12-03: qty 100

## 2022-12-03 MED ORDER — PROPOFOL 10 MG/ML IV BOLUS
INTRAVENOUS | Status: AC
  Filled 2022-12-03: qty 20

## 2022-12-03 MED ORDER — HYDROMORPHONE HCL 2 MG/ML IJ SOLN
INTRAMUSCULAR | Status: AC
  Filled 2022-12-03: qty 1

## 2022-12-03 MED ORDER — PROPOFOL 10 MG/ML IV BOLUS
INTRAVENOUS | Status: DC | PRN
  Administered 2022-12-03: 200 mg via INTRAVENOUS

## 2022-12-03 MED ORDER — STERILE WATER FOR IRRIGATION IR SOLN
Status: DC | PRN
  Administered 2022-12-03: 2000 mL

## 2022-12-03 MED ORDER — HYDROMORPHONE HCL 1 MG/ML IJ SOLN: 1.0000 mg | INTRAMUSCULAR | Status: DC | PRN

## 2022-12-03 MED ORDER — SODIUM CHLORIDE (PF) 0.9 % IJ SOLN
INTRAMUSCULAR | Status: DC | PRN
  Administered 2022-12-03: 30 mL

## 2022-12-03 MED ORDER — KETOROLAC TROMETHAMINE 30 MG/ML IJ SOLN
INTRAMUSCULAR | Status: AC
  Filled 2022-12-03: qty 1

## 2022-12-03 MED ORDER — CEFAZOLIN SODIUM-DEXTROSE 2-4 GM/100ML-% IV SOLN
2.0000 g | INTRAVENOUS | Status: AC
  Administered 2022-12-03: 2 g via INTRAVENOUS
  Filled 2022-12-03: qty 100

## 2022-12-03 MED ORDER — KETOROLAC TROMETHAMINE 30 MG/ML IJ SOLN
INTRAMUSCULAR | Status: DC | PRN
  Administered 2022-12-03: 30 mg

## 2022-12-03 MED ORDER — VASOPRESSIN 20 UNIT/ML IV SOLN
INTRAVENOUS | Status: AC
  Filled 2022-12-03: qty 1

## 2022-12-03 MED ORDER — ACETAMINOPHEN 10 MG/ML IV SOLN
1000.0000 mg | Freq: Once | INTRAVENOUS | Status: DC | PRN
  Administered 2022-12-03: 1000 mg via INTRAVENOUS

## 2022-12-03 MED ORDER — AMISULPRIDE (ANTIEMETIC) 5 MG/2ML IV SOLN: 10.0000 mg | Freq: Once | INTRAVENOUS | Status: DC | PRN

## 2022-12-03 MED ORDER — CEFAZOLIN SODIUM-DEXTROSE 2-4 GM/100ML-% IV SOLN
2.0000 g | Freq: Four times a day (QID) | INTRAVENOUS | Status: AC
  Administered 2022-12-03 (×2): 2 g via INTRAVENOUS
  Filled 2022-12-03 (×2): qty 100

## 2022-12-03 MED ORDER — HYDROMORPHONE HCL 1 MG/ML IJ SOLN
INTRAMUSCULAR | Status: DC | PRN
  Administered 2022-12-03: 2 mg via INTRAVENOUS

## 2022-12-03 MED ORDER — TIZANIDINE HCL 4 MG PO TABS
4.0000 mg | ORAL_TABLET | Freq: Three times a day (TID) | ORAL | Status: DC | PRN
  Administered 2022-12-03 – 2022-12-04 (×2): 4 mg via ORAL
  Filled 2022-12-03 (×2): qty 1

## 2022-12-03 MED ORDER — MENTHOL 3 MG MT LOZG
1.0000 | LOZENGE | OROMUCOSAL | Status: DC | PRN
  Administered 2022-12-03: 3 mg via ORAL
  Filled 2022-12-03: qty 9

## 2022-12-03 MED ORDER — MIDAZOLAM HCL 5 MG/5ML IJ SOLN
INTRAMUSCULAR | Status: DC | PRN
  Administered 2022-12-03: 2 mg via INTRAVENOUS

## 2022-12-03 MED ORDER — DIPHENHYDRAMINE HCL 12.5 MG/5ML PO ELIX: 12.5000 mg | ORAL_SOLUTION | ORAL | Status: DC | PRN

## 2022-12-03 MED ORDER — HYDROMORPHONE HCL 1 MG/ML IJ SOLN
0.5000 mg | INTRAMUSCULAR | Status: DC | PRN
  Administered 2022-12-03 – 2022-12-04 (×3): 1 mg via INTRAVENOUS
  Filled 2022-12-03 (×4): qty 1

## 2022-12-03 MED ORDER — LIDOCAINE HCL (CARDIAC) PF 100 MG/5ML IV SOSY
PREFILLED_SYRINGE | INTRAVENOUS | Status: DC | PRN
  Administered 2022-12-03: 60 mg via INTRATRACHEAL

## 2022-12-03 MED ORDER — ROCURONIUM BROMIDE 100 MG/10ML IV SOLN
INTRAVENOUS | Status: DC | PRN
  Administered 2022-12-03: 40 mg via INTRAVENOUS

## 2022-12-03 MED ORDER — PHENYLEPHRINE HCL (PRESSORS) 10 MG/ML IV SOLN
INTRAVENOUS | Status: DC | PRN
  Administered 2022-12-03 (×4): 160 ug via INTRAVENOUS

## 2022-12-03 MED ORDER — HYDROMORPHONE HCL 2 MG PO TABS
4.0000 mg | ORAL_TABLET | ORAL | Status: DC | PRN
  Administered 2022-12-03: 4 mg via ORAL
  Filled 2022-12-03: qty 2

## 2022-12-03 MED ORDER — FENTANYL CITRATE (PF) 100 MCG/2ML IJ SOLN
INTRAMUSCULAR | Status: DC | PRN
  Administered 2022-12-03 (×3): 100 ug via INTRAVENOUS

## 2022-12-03 MED ORDER — 0.9 % SODIUM CHLORIDE (POUR BTL) OPTIME
TOPICAL | Status: DC | PRN
  Administered 2022-12-03: 1000 mL

## 2022-12-03 MED ORDER — HYDROMORPHONE HCL 1 MG/ML IJ SOLN
INTRAMUSCULAR | Status: AC
  Administered 2022-12-03: 0.5 mg via INTRAVENOUS
  Filled 2022-12-03: qty 2

## 2022-12-03 MED ORDER — LACTATED RINGERS IV SOLN: INTRAVENOUS | Status: DC

## 2022-12-03 MED ORDER — DEXAMETHASONE SODIUM PHOSPHATE 4 MG/ML IJ SOLN
INTRAMUSCULAR | Status: DC | PRN
  Administered 2022-12-03: 8 mg via INTRAVENOUS

## 2022-12-03 MED ORDER — ONDANSETRON HCL 4 MG PO TABS
4.0000 mg | ORAL_TABLET | Freq: Four times a day (QID) | ORAL | Status: DC | PRN
  Administered 2022-12-04: 4 mg via ORAL
  Filled 2022-12-03: qty 1

## 2022-12-03 MED ORDER — HYDRALAZINE HCL 20 MG/ML IJ SOLN: 10.0000 mg | Freq: Four times a day (QID) | INTRAMUSCULAR | Status: DC | PRN

## 2022-12-03 MED ORDER — HYDROMORPHONE HCL 2 MG PO TABS
ORAL_TABLET | ORAL | Status: AC
  Filled 2022-12-03: qty 1

## 2022-12-03 MED ORDER — TRANEXAMIC ACID-NACL 1000-0.7 MG/100ML-% IV SOLN
1000.0000 mg | INTRAVENOUS | Status: AC
  Administered 2022-12-03: 1000 mg via INTRAVENOUS
  Filled 2022-12-03: qty 100

## 2022-12-03 MED ORDER — FLUOXETINE HCL 20 MG PO CAPS
20.0000 mg | ORAL_CAPSULE | Freq: Every day | ORAL | Status: DC
  Administered 2022-12-04: 20 mg via ORAL
  Filled 2022-12-03: qty 1

## 2022-12-03 MED ORDER — FUROSEMIDE 20 MG PO TABS
20.0000 mg | ORAL_TABLET | Freq: Every day | ORAL | Status: DC
  Administered 2022-12-04: 20 mg via ORAL
  Filled 2022-12-03: qty 1

## 2022-12-03 MED ORDER — HYDROMORPHONE HCL 2 MG PO TABS
4.0000 mg | ORAL_TABLET | ORAL | Status: DC | PRN
  Administered 2022-12-04: 4 mg via ORAL
  Filled 2022-12-03: qty 2

## 2022-12-03 MED ORDER — ACETAMINOPHEN 500 MG PO TABS
1000.0000 mg | ORAL_TABLET | Freq: Four times a day (QID) | ORAL | Status: DC
  Administered 2022-12-03 – 2022-12-04 (×4): 1000 mg via ORAL
  Filled 2022-12-03 (×4): qty 2

## 2022-12-03 MED ORDER — BUPIVACAINE HCL (PF) 0.25 % IJ SOLN
INTRAMUSCULAR | Status: AC
  Filled 2022-12-03: qty 30

## 2022-12-03 MED ORDER — EPINEPHRINE PF 1 MG/ML IJ SOLN
INTRAMUSCULAR | Status: AC
  Filled 2022-12-03: qty 1

## 2022-12-03 MED ORDER — PHENOL 1.4 % MT LIQD: 1.0000 | OROMUCOSAL | Status: DC | PRN

## 2022-12-03 MED ORDER — PHENYLEPHRINE HCL-NACL 20-0.9 MG/250ML-% IV SOLN
INTRAVENOUS | Status: AC
  Filled 2022-12-03: qty 250

## 2022-12-03 MED ORDER — ONDANSETRON HCL 4 MG/2ML IJ SOLN
4.0000 mg | Freq: Four times a day (QID) | INTRAMUSCULAR | Status: DC | PRN
  Administered 2022-12-03: 4 mg via INTRAVENOUS
  Filled 2022-12-03: qty 2

## 2022-12-03 MED ORDER — SUGAMMADEX SODIUM 200 MG/2ML IV SOLN
INTRAVENOUS | Status: DC | PRN
  Administered 2022-12-03: 200 mg via INTRAVENOUS

## 2022-12-03 MED ORDER — METOCLOPRAMIDE HCL 5 MG/ML IJ SOLN
5.0000 mg | Freq: Three times a day (TID) | INTRAMUSCULAR | Status: DC | PRN
  Administered 2022-12-03: 5 mg via INTRAVENOUS
  Filled 2022-12-03: qty 2

## 2022-12-03 MED ORDER — ASPIRIN 81 MG PO CHEW
81.0000 mg | CHEWABLE_TABLET | Freq: Two times a day (BID) | ORAL | Status: DC
  Administered 2022-12-03 – 2022-12-04 (×2): 81 mg via ORAL
  Filled 2022-12-03 (×2): qty 1

## 2022-12-03 MED ORDER — HYDROMORPHONE HCL 2 MG PO TABS
2.0000 mg | ORAL_TABLET | ORAL | Status: DC | PRN
  Administered 2022-12-03 – 2022-12-04 (×2): 2 mg via ORAL
  Filled 2022-12-03: qty 1

## 2022-12-03 MED ORDER — LIDOCAINE HCL (PF) 2 % IJ SOLN
INTRAMUSCULAR | Status: AC
  Filled 2022-12-03: qty 5

## 2022-12-03 MED ORDER — POVIDONE-IODINE 10 % EX SWAB
2.0000 | Freq: Once | CUTANEOUS | Status: AC
  Administered 2022-12-03: 2 via TOPICAL

## 2022-12-03 MED ORDER — PHENYLEPHRINE 80 MCG/ML (10ML) SYRINGE FOR IV PUSH (FOR BLOOD PRESSURE SUPPORT)
PREFILLED_SYRINGE | INTRAVENOUS | Status: AC
  Filled 2022-12-03: qty 10

## 2022-12-03 MED ORDER — BISACODYL 10 MG RE SUPP: 10.0000 mg | Freq: Every day | RECTAL | Status: DC | PRN

## 2022-12-03 MED ORDER — FENTANYL CITRATE PF 50 MCG/ML IJ SOSY
PREFILLED_SYRINGE | INTRAMUSCULAR | Status: AC
  Administered 2022-12-03: 50 ug via INTRAVENOUS
  Filled 2022-12-03: qty 3

## 2022-12-03 MED ORDER — CHLORHEXIDINE GLUCONATE 0.12 % MT SOLN
15.0000 mL | Freq: Once | OROMUCOSAL | Status: AC
  Administered 2022-12-03: 15 mL via OROMUCOSAL

## 2022-12-03 MED ORDER — ONDANSETRON HCL 4 MG/2ML IJ SOLN
INTRAMUSCULAR | Status: AC
  Filled 2022-12-03: qty 2

## 2022-12-03 MED ORDER — ONDANSETRON HCL 4 MG/2ML IJ SOLN
4.0000 mg | Freq: Once | INTRAMUSCULAR | Status: AC | PRN
  Administered 2022-12-03: 4 mg via INTRAVENOUS

## 2022-12-03 MED ORDER — HYDROMORPHONE HCL 1 MG/ML IJ SOLN
0.2500 mg | INTRAMUSCULAR | Status: DC | PRN
  Administered 2022-12-03 (×3): 0.5 mg via INTRAVENOUS

## 2022-12-03 MED ORDER — HYDROMORPHONE HCL 1 MG/ML IJ SOLN: 0.5000 mg | INTRAMUSCULAR | Status: DC | PRN

## 2022-12-03 SURGICAL SUPPLY — 40 items
ADH SKN CLS APL DERMABOND .7 (GAUZE/BANDAGES/DRESSINGS) ×1
BAG COUNTER SPONGE SURGICOUNT (BAG) IMPLANT
BAG DECANTER FOR FLEXI CONT (MISCELLANEOUS) IMPLANT
BAG SPEC THK2 15X12 ZIP CLS (MISCELLANEOUS)
BAG SPNG CNTER NS LX DISP (BAG)
BAG ZIPLOCK 12X15 (MISCELLANEOUS) IMPLANT
BLADE SAG 18X100X1.27 (BLADE) ×2 IMPLANT
COVER PERINEAL POST (MISCELLANEOUS) ×2 IMPLANT
COVER SURGICAL LIGHT HANDLE (MISCELLANEOUS) ×2 IMPLANT
CUP ACETBLR 52 OD PINNACLE (Hips) IMPLANT
DERMABOND ADVANCED .7 DNX12 (GAUZE/BANDAGES/DRESSINGS) ×2 IMPLANT
DRAPE FOOT SWITCH (DRAPES) ×2 IMPLANT
DRAPE STERI IOBAN 125X83 (DRAPES) ×2 IMPLANT
DRAPE U-SHAPE 47X51 STRL (DRAPES) ×4 IMPLANT
DRESSING AQUACEL AG SP 3.5X10 (GAUZE/BANDAGES/DRESSINGS) ×2 IMPLANT
DRSG AQUACEL AG SP 3.5X10 (GAUZE/BANDAGES/DRESSINGS) ×1
DURAPREP 26ML APPLICATOR (WOUND CARE) ×2 IMPLANT
ELECT REM PT RETURN 15FT ADLT (MISCELLANEOUS) ×2 IMPLANT
GLOVE BIO SURGEON STRL SZ 6 (GLOVE) ×2 IMPLANT
GLOVE BIOGEL PI IND STRL 6.5 (GLOVE) ×2 IMPLANT
GLOVE BIOGEL PI IND STRL 7.5 (GLOVE) ×2 IMPLANT
GLOVE ORTHO TXT STRL SZ7.5 (GLOVE) ×4 IMPLANT
GOWN STRL REUS W/ TWL LRG LVL3 (GOWN DISPOSABLE) ×4 IMPLANT
GOWN STRL REUS W/TWL LRG LVL3 (GOWN DISPOSABLE) ×2
HEAD CERAMIC DELTA 36 PLUS 1.5 (Hips) IMPLANT
HOLDER FOLEY CATH W/STRAP (MISCELLANEOUS) ×2 IMPLANT
KIT TURNOVER KIT A (KITS) IMPLANT
LINER NEUTRAL 52X36MM PLUS 4 (Liner) IMPLANT
PACK ANTERIOR HIP CUSTOM (KITS) ×2 IMPLANT
SCREW 6.5MMX30MM (Screw) IMPLANT
STEM FEM ACTIS HIGH SZ2 (Stem) IMPLANT
SUT MNCRL AB 4-0 PS2 18 (SUTURE) ×2 IMPLANT
SUT STRATAFIX 0 PDS 27 VIOLET (SUTURE) ×1
SUT VIC AB 1 CT1 36 (SUTURE) ×6 IMPLANT
SUT VIC AB 2-0 CT1 27 (SUTURE) ×2
SUT VIC AB 2-0 CT1 TAPERPNT 27 (SUTURE) ×4 IMPLANT
SUTURE STRATFX 0 PDS 27 VIOLET (SUTURE) ×2 IMPLANT
TRAY FOLEY MTR SLVR 16FR STAT (SET/KITS/TRAYS/PACK) IMPLANT
TUBE SUCTION HIGH CAP CLEAR NV (SUCTIONS) ×2 IMPLANT
WATER STERILE IRR 1000ML POUR (IV SOLUTION) ×2 IMPLANT

## 2022-12-03 NOTE — Transfer of Care (Signed)
Immediate Anesthesia Transfer of Care Note  Patient: Alba Cory  Procedure(s) Performed: TOTAL HIP ARTHROPLASTY ANTERIOR APPROACH (Left: Hip)  Patient Location: PACU  Anesthesia Type:General  Level of Consciousness: awake, alert , oriented, and patient cooperative  Airway & Oxygen Therapy: Patient Spontanous Breathing and Patient connected to face mask oxygen  Post-op Assessment: Report given to RN, Post -op Vital signs reviewed and stable, and Patient moving all extremities X 4  Post vital signs: Reviewed and stable  Last Vitals:  Vitals Value Taken Time  BP 148/86 12/03/22 1018  Temp    Pulse 88 12/03/22 1023  Resp    SpO2 100 % 12/03/22 1023  Vitals shown include unvalidated device data.  Last Pain:  Vitals:   12/03/22 0644  TempSrc:   PainSc: 8          Complications: No notable events documented.

## 2022-12-03 NOTE — Anesthesia Preprocedure Evaluation (Addendum)
Anesthesia Evaluation  Patient identified by MRN, date of birth, ID band Patient awake    Reviewed: Allergy & Precautions, NPO status , Patient's Chart, lab work & pertinent test results  Airway Mallampati: III  TM Distance: >3 FB Neck ROM: Full    Dental no notable dental hx.    Pulmonary neg pulmonary ROS   Pulmonary exam normal        Cardiovascular hypertension, Normal cardiovascular exam     Neuro/Psych  Headaches, Seizures -, Well Controlled,  PSYCHIATRIC DISORDERS Anxiety Depression     Neuromuscular disease    GI/Hepatic ,GERD  Medicated and Controlled,,(+)     substance abuse  , Hepatitis -Ulcerative colitis   Endo/Other  diabetes    Renal/GU negative Renal ROS     Musculoskeletal  (+) Arthritis ,  SLE (systemic lupus erythematosus) Chronic lower back pain   Abdominal  (+) + obese  Peds  Hematology negative hematology ROS (+)   Anesthesia Other Findings Left hip osteoarthritis  Reproductive/Obstetrics                             Anesthesia Physical Anesthesia Plan  ASA: 2  Anesthesia Plan: General   Post-op Pain Management:    Induction: Intravenous  PONV Risk Score and Plan: 3 and Ondansetron, Dexamethasone, Midazolam and Treatment may vary due to age or medical condition  Airway Management Planned: Oral ETT  Additional Equipment:   Intra-op Plan:   Post-operative Plan: Extubation in OR  Informed Consent: I have reviewed the patients History and Physical, chart, labs and discussed the procedure including the risks, benefits and alternatives for the proposed anesthesia with the patient or authorized representative who has indicated his/her understanding and acceptance.     Dental advisory given  Plan Discussed with: CRNA  Anesthesia Plan Comments: (GA per patient request)       Anesthesia Quick Evaluation

## 2022-12-03 NOTE — Interval H&P Note (Signed)
History and Physical Interval Note:  12/03/2022 7:11 AM  Erin Wolf  has presented today for surgery, with the diagnosis of Left hip osteoarthritis.  The various methods of treatment have been discussed with the patient and family. After consideration of risks, benefits and other options for treatment, the patient has consented to  Procedure(s): TOTAL HIP ARTHROPLASTY ANTERIOR APPROACH (Left) as a surgical intervention.  The patient's history has been reviewed, patient examined, no change in status, stable for surgery.  I have reviewed the patient's chart and labs.  Questions were answered to the patient's satisfaction.     Mauri Pole

## 2022-12-03 NOTE — Anesthesia Postprocedure Evaluation (Signed)
Anesthesia Post Note  Patient: Erin Wolf  Procedure(s) Performed: TOTAL HIP ARTHROPLASTY ANTERIOR APPROACH (Left: Hip)     Patient location during evaluation: PACU Anesthesia Type: General Level of consciousness: awake Pain management: pain level controlled Vital Signs Assessment: post-procedure vital signs reviewed and stable Respiratory status: spontaneous breathing, nonlabored ventilation and respiratory function stable Cardiovascular status: blood pressure returned to baseline and stable Postop Assessment: no apparent nausea or vomiting Anesthetic complications: no   No notable events documented.  Last Vitals:  Vitals:   12/03/22 1259 12/03/22 1501  BP: (!) 160/97 (!) 175/104  Pulse: 98 (!) 102  Resp: 17 18  Temp: (!) 36.4 C 36.7 C  SpO2: 99% 97%    Last Pain:  Vitals:   12/03/22 1501  TempSrc: Oral  PainSc:                  Lachanda Buczek P Kenly Henckel

## 2022-12-03 NOTE — Op Note (Signed)
NAME:  Erin Wolf.: [id number]      MEDICAL RECORD NO.: [id number]      FACILITY:  [facility]      PHYSICIAN:  Mauri Pole  DATE OF BIRTH:  1956-02-03     DATE OF PROCEDURE:  12/03/2022                                 OPERATIVE REPORT         PREOPERATIVE DIAGNOSIS: Left  hip osteoarthritis.      POSTOPERATIVE DIAGNOSIS:  Left hip osteoarthritis.      PROCEDURE:  Left total hip replacement through an anterior approach   utilizing DePuy THR system, component size 52 mm pinnacle cup, a size 36+4 neutral   Altrex liner, a size 2 Hi Actis stem with a 36+1.5 delta ceramic   ball.      SURGEON:  Pietro Cassis. Alvan Dame, M.D.      ASSISTANT:  Costella Hatcher, PA-C     ANESTHESIA:  General.      SPECIMENS:  None.      COMPLICATIONS:  None.      BLOOD LOSS:  400 cc     DRAINS:  None.      INDICATION OF THE PROCEDURE:  Erin Wolf is a 67 y.o. female who had   presented to office for evaluation of left hip pain.  Radiographs revealed   progressive degenerative changes with bone-on-bone   articulation of the  hip joint, including subchondral cystic changes and osteophytes.  The patient had painful limited range of   motion significantly affecting their overall quality of life and function.  The patient was failing to    respond to conservative measures including medications and/or injections and activity modification and at this point was ready   to proceed with more definitive measures.  Consent was obtained for   benefit of pain relief.  Specific risks of infection, DVT, component   failure, dislocation, neurovascular injury, and need for revision surgery were reviewed in the office.     PROCEDURE IN DETAIL:  The patient was brought to operative theater.   Once adequate anesthesia, preoperative antibiotics, 2 gm of Ancef, 1 gm of Tranexamic Acid, and 10 mg of Decadron were administered, the patient was positioned supine on the  [employer] table.  Once the patient was safely positioned with adequate padding of boney prominences we predraped out the hip, and used fluoroscopy to confirm orientation of the pelvis.      The left hip was then prepped and draped from proximal iliac crest to   mid thigh with a shower curtain technique.      Time-out was performed identifying the patient, planned procedure, and the appropriate extremity.     An incision was then made 2 cm lateral to the   anterior superior iliac spine extending over the orientation of the   tensor fascia lata muscle and sharp dissection was carried down to the   fascia of the muscle.      The fascia was then incised.  The muscle belly was identified and swept   laterally and retractor placed along the superior neck.  Following   cauterization of the circumflex vessels and removing some pericapsular   fat, a second cobra retractor was placed on the inferior  neck.  A T-capsulotomy was made along the line of the   superior neck to the trochanteric fossa, then extended proximally and   distally.  Tag sutures were placed and the retractors were then placed   intracapsular.  We then identified the trochanteric fossa and   orientation of my neck cut and then made a neck osteotomy with the femur on traction.  The femoral   head was removed without difficulty or complication.  Traction was let   off and retractors were placed posterior and anterior around the   acetabulum.      The labrum and foveal tissue were debrided.  I began reaming with a 45 mm   reamer and reamed up to 51 mm reamer with good bony bed preparation and a 52 mm  cup was chosen.  The final 52 mm Pinnacle cup was then impacted under fluoroscopy to confirm the depth of penetration and orientation with respect to   Abduction and forward flexion.  A screw was placed into the ilium followed by the hole eliminator.  The final   36+4 neutral Altrex liner was impacted with good visualized rim fit.  The  cup was positioned anatomically within the acetabular portion of the pelvis.      At this point, the femur was rolled to 100 degrees.  Further capsule was   released off the inferior aspect of the femoral neck.  I then   released the superior capsule proximally.  With the leg in a neutral position the hook was placed laterally   along the femur under the vastus lateralis origin and elevated manually and then held in position using the hook attachment on the bed.  The leg was then extended and adducted with the leg rolled to 100   degrees of external rotation.  Retractors were placed along the medial calcar and posteriorly over the greater trochanter.  Once the proximal femur was fully   exposed, I used a box osteotome to set orientation.  I then began   broaching with the starting chili pepper broach and passed this by hand and then broached up to 2.  With the 2 broach in place I chose a high offset neck and did several trial reductions.  The offset was appropriate, leg lengths   appeared to be equal best matched with the +1.5 head ball trial confirmed radiographically.   Given these findings, I went ahead and dislocated the hip, repositioned all   retractors and positioned the right hip in the extended and abducted position.  The final 2 Hi Actis stem was   chosen and it was impacted down to the level of neck cut.  Based on this   and the trial reductions, a final 36+1.5 delta ceramic ball was chosen and   impacted onto a clean and dry trunnion, and the hip was reduced.  The   hip had been irrigated throughout the case again at this point.  I did   reapproximate the superior capsular leaflet to the anterior leaflet   using #1 Vicryl.  The fascia of the   tensor fascia lata muscle was then reapproximated using #1 Vicryl and #0 Stratafix sutures.  The   remaining wound was closed with 2-0 Vicryl and running 4-0 Monocryl.   The hip was cleaned, dried, and dressed sterilely using Dermabond and    Aquacel dressing.  The patient was then brought   to recovery room in stable condition tolerating the procedure well.    Caryl Pina  Lu Duffel, PA-C was present for the entirety of the case involved from   preoperative positioning, perioperative retractor management, general   facilitation of the case, as well as primary wound closure as assistant.            Pietro Cassis Alvan Dame, M.D.        12/03/2022 8:47 AM

## 2022-12-03 NOTE — Discharge Instructions (Signed)

## 2022-12-03 NOTE — Anesthesia Procedure Notes (Signed)
Procedure Name: Intubation Date/Time: 12/03/2022 8:42 AM  Performed by: Jonna Munro, CRNAPre-anesthesia Checklist: Patient identified, Emergency Drugs available, Suction available, Patient being monitored and Timeout performed Patient Re-evaluated:Patient Re-evaluated prior to induction Oxygen Delivery Method: Circle system utilized Preoxygenation: Pre-oxygenation with 100% oxygen Induction Type: IV induction Ventilation: Mask ventilation without difficulty Laryngoscope Size: Mac and 3 Grade View: Grade I Tube type: Oral Tube size: 7.0 mm Number of attempts: 1 Airway Equipment and Method: Stylet Placement Confirmation: ETT inserted through vocal cords under direct vision, CO2 detector, positive ETCO2 and breath sounds checked- equal and bilateral Secured at: 23 cm Tube secured with: Tape Dental Injury: Teeth and Oropharynx as per pre-operative assessment

## 2022-12-03 NOTE — Progress Notes (Signed)
PT Cancellation Note  Patient Details Name: Erin Wolf MRN: [id number] DOB: 02/07/1956   Cancelled Treatment:    Reason Eval/Treat Not Completed: Pain limiting ability to participate (per RN, pt has 10/10 pain and has had max pain medication, not able to tolerate activity at present. Will follow.)   Philomena Doheny PT 12/03/2022  Acute Rehabilitation Services  Office [phone]

## 2022-12-04 DIAGNOSIS — M1612 Unilateral primary osteoarthritis, left hip: Secondary | ICD-10-CM | POA: Diagnosis not present

## 2022-12-04 LAB — CBC
HCT: 34.6 % — ABNORMAL LOW (ref 36.0–46.0)
Hemoglobin: 10.5 g/dL — ABNORMAL LOW (ref 12.0–15.0)
MCH: 25.8 pg — ABNORMAL LOW (ref 26.0–34.0)
MCHC: 30.3 g/dL (ref 30.0–36.0)
MCV: 85 fL (ref 80.0–100.0)
Platelets: 243 10*3/uL (ref 150–400)
RBC: 4.07 MIL/uL (ref 3.87–5.11)
RDW: 15.1 % (ref 11.5–15.5)
WBC: 11.3 10*3/uL — ABNORMAL HIGH (ref 4.0–10.5)
nRBC: 0 % (ref 0.0–0.2)

## 2022-12-04 LAB — BASIC METABOLIC PANEL
Anion gap: 10 (ref 5–15)
BUN: 7 mg/dL — ABNORMAL LOW (ref 8–23)
CO2: 23 mmol/L (ref 22–32)
Calcium: 8.6 mg/dL — ABNORMAL LOW (ref 8.9–10.3)
Chloride: 104 mmol/L (ref 98–111)
Creatinine, Ser: 0.59 mg/dL (ref 0.44–1.00)
GFR, Estimated: >60 mL/min (ref >60)
Glucose, Bld: 116 mg/dL — ABNORMAL HIGH (ref 70–99)
Potassium: 3.9 mmol/L (ref 3.5–5.1)
Sodium: 137 mmol/L (ref 135–145)

## 2022-12-04 MED ORDER — ASPIRIN 81 MG PO CHEW: 81.0000 mg | CHEWABLE_TABLET | Freq: Two times a day (BID) | ORAL | 0 refills | Status: AC

## 2022-12-04 MED ORDER — TIZANIDINE HCL 4 MG PO TABS: 4.0000 mg | ORAL_TABLET | Freq: Three times a day (TID) | ORAL | 2 refills | Status: DC | PRN

## 2022-12-04 MED ORDER — HYDROMORPHONE HCL 4 MG PO TABS: 4.0000 mg | ORAL_TABLET | ORAL | 0 refills | Status: DC | PRN

## 2022-12-04 MED ORDER — POLYETHYLENE GLYCOL 3350 17 G PO PACK: 17.0000 g | PACK | Freq: Two times a day (BID) | ORAL | 0 refills | Status: DC

## 2022-12-04 NOTE — Evaluation (Signed)
Physical Therapy Evaluation Patient Details Name: Erin Wolf MRN: [id number] DOB: [birth year] Today's Date: 12/04/2022  History of Present Illness  67 y.o. female admitted 12/03/22 for L AA-THA. 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  Clinical Impression  Pt is s/p THA resulting in the deficits listed below (see PT Problem List). Pt ambulated 120' with RW, no loss of balance. Stair training completed. Pt demonstrates good understanding of HEP. She is ready to DC home from a PT standpoint.      Recommendations for follow up therapy are one component of a multi-disciplinary discharge planning process, led by the attending physician.  Recommendations may be updated based on patient status, additional functional criteria and insurance authorization.  Follow Up Recommendations Follow physician's recommendations for discharge plan and follow up therapies      Assistance Recommended at Discharge Intermittent Supervision/Assistance  Patient can return home with the following  A little help with bathing/dressing/bathroom;Assistance with cooking/housework;Assist for transportation;Help with stairs or ramp for entrance    Equipment Recommendations None recommended by PT  Recommendations for Other Services       Functional Status Assessment Patient has had a recent decline in their functional status and demonstrates the ability to make significant improvements in function in a reasonable and predictable amount of time.     Precautions / Restrictions Precautions Precautions: Fall Restrictions Weight Bearing Restrictions: No LLE Weight Bearing: Weight bearing as tolerated      Mobility  Bed Mobility Overal bed mobility: Modified Independent             General bed mobility comments: HOB up, used gait belt as LLE lifter    Transfers Overall transfer level: Needs assistance Equipment used: Rolling walker (2 wheels) Transfers: Sit to/from Stand Sit  to Stand: Min guard, From elevated surface           General transfer comment: VCs hand placement    Ambulation/Gait Ambulation/Gait assistance: Supervision Gait Distance (Feet): 120 Feet Assistive device: Rolling walker (2 wheels) Gait Pattern/deviations: Step-to pattern Gait velocity: decr     General Gait Details: steady, VCs sequencing  Stairs Stairs: Yes Stairs assistance: Supervision Stair Management: Two rails, Step to pattern, Forwards Number of Stairs: 3 General stair comments: VCs sequencing  Wheelchair Mobility    Modified Rankin (Stroke Patients Only)       Balance Overall balance assessment: Modified Independent                                           Pertinent Vitals/Pain Pain Assessment Pain Assessment: 0-10 Pain Score: 8  Pain Location: L hip Pain Descriptors / Indicators: Operative site guarding, Grimacing Pain Intervention(s): RN gave pain meds during session, Patient requesting pain meds-RN notified, Limited activity within patient's tolerance, Monitored during session, Ice applied    Home Living Family/patient expects to be discharged to:: Private residence Living Arrangements: Spouse/significant other Available Help at Discharge: Family Type of Home: House Home Access: Stairs to enter Entrance Stairs-Rails: Can reach both;Left;Right Entrance Stairs-Number of Steps: 4   Home Layout: One level Home Equipment: Conservation officer, nature (2 wheels);Rollator (4 wheels);BSC/3in1;Grab bars - tub/shower;Grab bars - toilet      Prior Function Prior Level of Function : Independent/Modified Independent                     Hand Dominance  Extremity/Trunk Assessment   Upper Extremity Assessment Upper Extremity Assessment: Overall WFL for tasks assessed    Lower Extremity Assessment Lower Extremity Assessment: LLE deficits/detail LLE Deficits / Details: knee ext at least 3/5 LLE Sensation: WNL LLE Coordination:  WNL    Cervical / Trunk Assessment Cervical / Trunk Assessment: Normal  Communication   Communication: No difficulties  Cognition Arousal/Alertness: Awake/alert Behavior During Therapy: WFL for tasks assessed/performed Overall Cognitive Status: Within Functional Limits for tasks assessed                                          General Comments      Exercises Total Joint Exercises Ankle Circles/Pumps: AROM, Both, 10 reps, Supine Quad Sets: AROM, Both, 5 reps, Supine Short Arc Quad: AROM, Left, 5 reps, Supine Heel Slides: AAROM, Left, 5 reps, Supine Hip ABduction/ADduction: AROM, Left, 5 reps, Supine Long Arc Quad: AROM, Left, 10 reps, Seated   Assessment/Plan    PT Assessment Patient does not need any further PT services  PT Problem List         PT Treatment Interventions      PT Goals (Current goals can be found in the Care Plan section)  Acute Rehab PT Goals Patient Stated Goal: write her book PT Goal Formulation: All assessment and education complete, DC therapy    Frequency       Co-evaluation               AM-PAC PT "6 Clicks" Mobility  Outcome Measure Help needed turning from your back to your side while in a flat bed without using bedrails?: None Help needed moving from lying on your back to sitting on the side of a flat bed without using bedrails?: A Little Help needed moving to and from a bed to a chair (including a wheelchair)?: None Help needed standing up from a chair using your arms (e.g., wheelchair or bedside chair)?: None Help needed to walk in hospital room?: None Help needed climbing 3-5 steps with a railing? : None 6 Click Score: 23    End of Session Equipment Utilized During Treatment: Gait belt Activity Tolerance: Patient tolerated treatment well Patient left: in chair;with call bell/phone within reach;with nursing/sitter in room;with chair alarm set Nurse Communication: Mobility status      Time: 1127-1202 PT  Time Calculation (min) (ACUTE ONLY): 35 min   Charges:   PT Evaluation $PT Eval Moderate Complexity: 1 Mod PT Treatments $Gait Training: 8-22 mins        Blondell Reveal Kistler PT 12/04/2022  Acute Rehabilitation Services  Office [phone]

## 2022-12-04 NOTE — Progress Notes (Signed)
Subjective: 1 Day Post-Op Procedure(s) (LRB): TOTAL HIP ARTHROPLASTY ANTERIOR APPROACH (Left) Patient reports pain as severe.   Patient seen in rounds with Dr. Alvan Dame. Patient is resting in bed on exam this morning. No acute events overnight. Foley catheter removed. Patient was not able to ambulate with PT secondary to pain.  We will start therapy today.   Objective: Vital signs in last 24 hours: Temp:  [97.1 F (36.2 C)-98.2 F (36.8 C)] 98.2 F (36.8 C) (02/14 0601) Pulse Rate:  [76-102] 98 (02/14 0601) Resp:  [12-18] 16 (02/14 0601) BP: (149-175)/(80-111) 175/97 (02/14 0601) SpO2:  [97 %-100 %] 100 % (02/14 0601)  Intake/Output from previous day:  Intake/Output Summary (Last 24 hours) at 12/04/2022 0839 Last data filed at 12/04/2022 0500 Gross per 24 hour  Intake 1825 ml  Output 2400 ml  Net -575 ml     Intake/Output this shift: No intake/output data recorded.  Labs: Recent Labs    12/04/22 0359  HGB 10.5*   Recent Labs    12/04/22 0359  WBC 11.3*  RBC 4.07  HCT 34.6*  PLT 243   Recent Labs    12/04/22 0359  NA 137  K 3.9  CL 104  CO2 23  BUN 7*  CREATININE 0.59  GLUCOSE 116*  CALCIUM 8.6*   No results for input(s): "LABPT", "INR" in the last 72 hours.  Exam: General - Patient is Alert and Oriented Extremity - Neurologically intact Sensation intact distally Intact pulses distally Dorsiflexion/Plantar flexion intact Dressing - dressing C/D/I Motor Function - intact, moving foot and toes well on exam.   Past Medical History:  Diagnosis Date   Anxiety    Arthritis    Chronic abdominal pain    Chronic lower back pain    Chronic nausea    Depression    Diabetes mellitus without complication (HCC)    not on meds   Diverticulosis    Dyspnea    with exertion   Frequency of urination    GERD (gastroesophageal reflux disease)    Headache    Hematuria    Hepatitis    pt denies   Hepatitis C antibody positive in blood    per pt told by  pcp   History of panic attacks    History of syncope    hx recurrent syncope --- per epic domentation non-cardiac , orthostatic hypotension, anxiety, dehydration   History of uterine fibroid    HTN (hypertension), benign    Hyperlipidemia 01/03/2012   IBS (irritable bowel syndrome)    Myalgia    Sciatic nerve disease, left 2023   Seizure disorder ([place]) followed by pcp until new neurologist appr. in jan 2019   "started having them in my 20's" ,  petit mal ----  per pt last seizure one 2015   SLE (systemic lupus erythematosus) [facility])    rheumatologist-- dr Gerilyn Nestle (consult 11-12-2016) having work-up done   Ulcerative colitis    followed by dr Penelope Coop at [place]   Wears glasses     Assessment/Plan: 1 Day Post-Op Procedure(s) (LRB): TOTAL HIP ARTHROPLASTY ANTERIOR APPROACH (Left) Principal Problem:   S/P total left hip arthroplasty  Estimated body mass index is 37.42 kg/m as calculated from the following:   Height as of this encounter: 5' 5"$  (1.651 m).   Weight as of this encounter: 102 kg. Advance diet Up with therapy  DVT Prophylaxis - Aspirin Weight bearing as tolerated.  Hgb stable at 10.5 this AM.  She has significant pain  control issues. She takes oxycodone 10-20 mg q4h at home normally  We have changed to dilaudid here which we did for her knees with some relief.  Have talked with her RN about prioritizing oral meds over IV, but she has required IV medication. She will likely need to stay until tomorrow for safe discharge home.   Griffith Citron, PA-C Orthopedic Surgery [phone] 12/04/2022, 8:39 AM

## 2022-12-04 NOTE — Progress Notes (Signed)
Discharge package printed and instructions given to patient. Verbalizes understanding.  

## 2022-12-04 NOTE — TOC Transition Note (Signed)
Transition of Care [facility]) - CM/SW Discharge Note   Patient Details  Name: Erin Wolf MRN: [id number] Date of Birth: 10/06/1956  [facility] CM/SW Contact:  Lennart Pall, LCSW Phone Number: 12/04/2022, 11:08 AM   Clinical Narrative:     Met with pt and confirming she has needed DME at home.  Plan for HEP.  No TOC needs.  Final next level of care: Home/Self Care Barriers to Discharge: No Barriers Identified   Patient Goals and CMS Choice      Discharge Placement                         Discharge Plan and Services Additional resources added to the After Visit Summary for                  DME Arranged: N/A DME Agency: NA                  Social Determinants of Health (SDOH) Interventions SDOH Screenings   Food Insecurity: No Food Insecurity (12/03/2022)  Housing: Low Risk  (12/03/2022)  Transportation Needs: No Transportation Needs (12/03/2022)  Utilities: Not At Risk (12/03/2022)  Tobacco Use: Low Risk  (12/03/2022)     Readmission Risk Interventions     No data to display

## 2022-12-04 NOTE — Plan of Care (Signed)
  Problem: Activity: Goal: Ability to avoid complications of mobility impairment will improve Outcome: Progressing Goal: Ability to tolerate increased activity will improve Outcome: Progressing   Problem: Activity: Goal: Ability to avoid complications of mobility impairment will improve Outcome: Progressing Goal: Ability to tolerate increased activity will improve Outcome: Progressing

## 2022-12-05 ENCOUNTER — Encounter (HOSPITAL_COMMUNITY): Payer: Self-pay | Admitting: Orthopedic Surgery

## 2022-12-17 NOTE — Discharge Summary (Signed)
Patient ID: Erin Wolf MRN: [id number] DOB/AGE: [birth year] 67 y.o.  Admit date: 12/03/2022 Discharge date: 12/04/2022  Admission Diagnoses:  Left hip osteoarthritis  Discharge Diagnoses:  Principal Problem:   S/P total left hip arthroplasty   Past Medical History:  Diagnosis Date   Anxiety    Arthritis    Chronic abdominal pain    Chronic lower back pain    Chronic nausea    Depression    Diabetes mellitus without complication ([place])    not on meds   Diverticulosis    Dyspnea    with exertion   Frequency of urination    GERD (gastroesophageal reflux disease)    Headache    Hematuria    Hepatitis    pt denies   Hepatitis C antibody positive in blood    per pt told by pcp   History of panic attacks    History of syncope    hx recurrent syncope --- per epic domentation non-cardiac , orthostatic hypotension, anxiety, dehydration   History of uterine fibroid    HTN (hypertension), benign    Hyperlipidemia 01/03/2012   IBS (irritable bowel syndrome)    Myalgia    Sciatic nerve disease, left 2023   Seizure disorder ([place]) followed by pcp until new neurologist appr. in jan 2019   "started having them in my 20's" ,  petit mal ----  per pt last seizure one 2015   SLE (systemic lupus erythematosus) [facility])    rheumatologist-- dr Gerilyn Nestle (consult 11-12-2016) having work-up done   Ulcerative colitis    followed by dr Penelope Coop at [place]   Wears glasses     Surgeries: Procedure(s): [place] on 12/03/2022   Consultants:   Discharged Condition: Improved  Hospital Course: Erin Wolf is an 67 y.o. female who was admitted 12/03/2022 for operative treatment ofS/P total left hip arthroplasty. Patient has severe unremitting pain that affects sleep, daily activities, and work/hobbies. After pre-op clearance the patient was taken to the operating room on 12/03/2022 and underwent  Procedure(s): TOTAL HIP ARTHROPLASTY ANTERIOR APPROACH.     Patient was given perioperative antibiotics:  Anti-infectives (From admission, onward)    Start     Dose/Rate Route Frequency Ordered Stop   12/03/22 1400  ceFAZolin (ANCEF) IVPB 2g/100 mL premix        2 g 200 mL/hr over 30 Minutes Intravenous Every 6 hours 12/03/22 1021 12/03/22 2132   12/03/22 0630  ceFAZolin (ANCEF) IVPB 2g/100 mL premix        2 g 200 mL/hr over 30 Minutes Intravenous On call to O.R. 12/03/22 [id number] 12/03/22 0844        Patient was given sequential compression devices, early ambulation, and chemoprophylaxis to prevent DVT. Patient worked with PT and was meeting their goals regarding safe ambulation and transfers.  Patient benefited maximally from hospital stay and there were no complications.    Recent vital signs: No data found.   Recent laboratory studies: No results for input(s): "WBC", "HGB", "HCT", "PLT", "NA", "K", "CL", "CO2", "BUN", "CREATININE", "GLUCOSE", "INR", "CALCIUM" in the last 72 hours.  Invalid input(s): "PT", "2"   Discharge Medications:   Allergies as of 12/04/2022       Reactions   Latex Other (See Comments)   Peels skin   Nsaids Other (See Comments)   HAS COLITIS FLARES   Wellbutrin [bupropion Hcl] Other (See Comments)   Insomnia and headaches   Aspirin Nausea Only   Darvocet [propoxyphene N-acetaminophen] Nausea  And Vomiting   Tape Hives   Toradol [ketorolac Tromethamine] Nausea And Vomiting   Tylenol [acetaminophen] Nausea Only   Butalbital-apap-caffeine    stomach upset   Ciprofloxacin    stomach upset   Eszopiclone Swelling   Other    Spicy foods cause diarrhea due to colitis   Topiramate    stomach upset        Medication List     STOP taking these medications    betamethasone dipropionate 0.05 % lotion   cyclobenzaprine 10 MG tablet Commonly known as: FLEXERIL   Oxycodone HCl 10 MG Tabs   traMADol 50 MG tablet Commonly known as: ULTRAM   traZODone 50 MG tablet Commonly known as: DESYREL        TAKE these medications    aspirin 81 MG chewable tablet Chew 1 tablet (81 mg total) by mouth 2 (two) times daily for 28 days.   esomeprazole 40 MG capsule Commonly known as: NEXIUM Take 40 mg by mouth daily as needed (acid reflux).   FLUoxetine 20 MG capsule Commonly known as: PROZAC Take 20 mg by mouth daily.   furosemide 20 MG tablet Commonly known as: LASIX Take 20 mg by mouth daily.   HYDROmorphone 4 MG tablet Commonly known as: DILAUDID Take 1 tablet (4 mg total) by mouth every 4 (four) hours as needed for severe pain. DO NOT take oxycodone while taking this medication.   Narcan 4 MG/0.1ML Liqd nasal spray kit Generic drug: naloxone Place 1 spray into the nose daily as needed (opioid overdose).   polyethylene glycol 17 g packet Commonly known as: MIRALAX / GLYCOLAX Take 17 g by mouth 2 (two) times daily. What changed:  when to take this reasons to take this   rosuvastatin 10 MG tablet Commonly known as: CRESTOR Take 10 mg by mouth every Monday.   SYSTANE OP Place 1 drop into both eyes 2 (two) times daily as needed (dry eyes).   tiZANidine 4 MG tablet Commonly known as: ZANAFLEX Take 1 tablet (4 mg total) by mouth every 8 (eight) hours as needed for muscle spasms.   WOMENS MULTI PO Take 1 tablet by mouth daily. Centrum               Discharge Care Instructions  (From admission, onward)           Start     Ordered   12/04/22 0000  Change dressing       Comments: Maintain surgical dressing until follow up in the clinic. If the edges start to pull up, may reinforce with tape. If the dressing is no longer working, may remove and cover with gauze and tape, but must keep the area dry and clean.  Call with any questions or concerns.   12/04/22 1222            Diagnostic Studies: DG Pelvis Portable  Result Date: 12/03/2022 CLINICAL DATA:  Status post total hip arthroplasty. EXAM: PORTABLE PELVIS 1-2 VIEWS COMPARISON:  Left hip radiographs  10/01/2022 FINDINGS: Sequelae of interval left total hip arthroplasty are identified. The prosthetic components appear normally positioned on this single AP radiograph. No acute fracture is identified. Gas is noted in the adjacent soft tissues. Disc degeneration is noted in the included lower lumbar spine. IMPRESSION: Interval left total hip arthroplasty. Electronically Signed   By: Logan Bores M.D.   On: 12/03/2022 11:45   DG HIP UNILAT WITH PELVIS 1V LEFT  Result Date: 12/03/2022 CLINICAL DATA:  LEFT total  hip arthroplasty. EXAM: DG HIP (WITH OR WITHOUT PELVIS) 1V*L*; DG C-ARM 1-60 MIN-NO REPORT COMPARISON:  Plain film of the pelvis and LEFT hip dated 10/01/2022. FINDINGS: Two intraoperative fluoroscopic images are provided, demonstrating a total LEFT hip arthroplasty. Hardware appears intact and appropriately positioned. Osseous alignment is anatomic. Fluoroscopy was provided for 9 seconds. 1.3900 mGy IMPRESSION: Intraoperative fluoroscopic images demonstrating total LEFT hip arthroplasty. No evidence of surgical complicating feature. Electronically Signed   By: Franki Cabot M.D.   On: 12/03/2022 10:00   DG C-Arm 1-60 Min-No Report  Result Date: 12/03/2022 Fluoroscopy was utilized by the requesting physician.  No radiographic interpretation.    Disposition: Discharge disposition: 01-Home or Self Care       Discharge Instructions     Call MD / Call 911   Complete by: As directed    If you experience chest pain or shortness of breath, CALL 911 and be transported to the hospital emergency room.  If you develope a fever above 101 F, pus (white drainage) or increased drainage or redness at the wound, or calf pain, call your surgeon's office.   Change dressing   Complete by: As directed    Maintain surgical dressing until follow up in the clinic. If the edges start to pull up, may reinforce with tape. If the dressing is no longer working, may remove and cover with gauze and tape, but must keep  the area dry and clean.  Call with any questions or concerns.   Constipation Prevention   Complete by: As directed    Drink plenty of fluids.  Prune juice may be helpful.  You may use a stool softener, such as Colace (over the counter) 100 mg twice a day.  Use MiraLax (over the counter) for constipation as needed.   Diet - low sodium heart healthy   Complete by: As directed    Increase activity slowly as tolerated   Complete by: As directed    Weight bearing as tolerated with assist device (walker, cane, etc) as directed, use it as long as suggested by your surgeon or therapist, typically at least 4-6 weeks.   Post-operative opioid taper instructions:   Complete by: As directed    POST-OPERATIVE OPIOID TAPER INSTRUCTIONS: It is important to wean off of your opioid medication as soon as possible. If you do not need pain medication after your surgery it is ok to stop day one. Opioids include: Codeine, Hydrocodone(Norco, Vicodin), Oxycodone(Percocet, oxycontin) and hydromorphone amongst others.  Long term and even short term use of opiods can cause: Increased pain response Dependence Constipation Depression Respiratory depression And more.  Withdrawal symptoms can include Flu like symptoms Nausea, vomiting And more Techniques to manage these symptoms Hydrate well Eat regular healthy meals Stay active Use relaxation techniques(deep breathing, meditating, yoga) Do Not substitute Alcohol to help with tapering If you have been on opioids for less than two weeks and do not have pain than it is ok to stop all together.  Plan to wean off of opioids This plan should start within one week post op of your joint replacement. Maintain the same interval or time between taking each dose and first decrease the dose.  Cut the total daily intake of opioids by one tablet each day Next start to increase the time between doses. The last dose that should be eliminated is the evening dose.      TED  hose   Complete by: As directed    Use stockings (TED hose) for  2 weeks on both leg(s).  You may remove them at night for sleeping.        Follow-up Information     Paralee Cancel, MD. Schedule an appointment as soon as possible for a visit in 2 week(s).   Specialty: Orthopedic Surgery Contact information: [address] [place] [address] [phone]                  Signed: Irving Copas 12/17/2022, 8:01 AM

## 2023-01-07 ENCOUNTER — Encounter (HOSPITAL_BASED_OUTPATIENT_CLINIC_OR_DEPARTMENT_OTHER): Payer: Self-pay | Admitting: Emergency Medicine

## 2023-01-07 ENCOUNTER — Emergency Department (HOSPITAL_BASED_OUTPATIENT_CLINIC_OR_DEPARTMENT_OTHER)
Admission: EM | Admit: 2023-01-07 | Discharge: 2023-01-07 | Disposition: A | Discharge status: Home / Self Care | Payer: Medicare HMO | Attending: Emergency Medicine | Admitting: Emergency Medicine

## 2023-01-07 ENCOUNTER — Other Ambulatory Visit: Payer: Self-pay

## 2023-01-07 ENCOUNTER — Emergency Department (HOSPITAL_BASED_OUTPATIENT_CLINIC_OR_DEPARTMENT_OTHER): Payer: Medicare HMO

## 2023-01-07 DIAGNOSIS — Z1152 Encounter for screening for COVID-19: Secondary | ICD-10-CM | POA: Diagnosis not present

## 2023-01-07 DIAGNOSIS — M25552 Pain in left hip: Secondary | ICD-10-CM | POA: Diagnosis present

## 2023-01-07 DIAGNOSIS — R700 Elevated erythrocyte sedimentation rate: Secondary | ICD-10-CM | POA: Insufficient documentation

## 2023-01-07 DIAGNOSIS — Z79899 Other long term (current) drug therapy: Secondary | ICD-10-CM | POA: Insufficient documentation

## 2023-01-07 DIAGNOSIS — M549 Dorsalgia, unspecified: Secondary | ICD-10-CM | POA: Diagnosis not present

## 2023-01-07 DIAGNOSIS — E119 Type 2 diabetes mellitus without complications: Secondary | ICD-10-CM | POA: Diagnosis not present

## 2023-01-07 DIAGNOSIS — T888XXA Other specified complications of surgical and medical care, not elsewhere classified, initial encounter: Secondary | ICD-10-CM

## 2023-01-07 DIAGNOSIS — I10 Essential (primary) hypertension: Secondary | ICD-10-CM | POA: Insufficient documentation

## 2023-01-07 DIAGNOSIS — L7634 Postprocedural seroma of skin and subcutaneous tissue following other procedure: Secondary | ICD-10-CM | POA: Insufficient documentation

## 2023-01-07 DIAGNOSIS — Z91040 Latex allergy status: Secondary | ICD-10-CM | POA: Insufficient documentation

## 2023-01-07 DIAGNOSIS — R109 Unspecified abdominal pain: Secondary | ICD-10-CM | POA: Diagnosis not present

## 2023-01-07 DIAGNOSIS — R509 Fever, unspecified: Secondary | ICD-10-CM

## 2023-01-07 LAB — CBC WITH DIFFERENTIAL/PLATELET
Abs Immature Granulocytes: 0.01 10*3/uL (ref 0.00–0.07)
Basophils Absolute: 0 10*3/uL (ref 0.0–0.1)
Basophils Relative: 1 %
Eosinophils Absolute: 0 10*3/uL (ref 0.0–0.5)
Eosinophils Relative: 0 %
HCT: 37.2 % (ref 36.0–46.0)
Hemoglobin: 11.4 g/dL — ABNORMAL LOW (ref 12.0–15.0)
Immature Granulocytes: 0 %
Lymphocytes Relative: 53 %
Lymphs Abs: 2.8 10*3/uL (ref 0.7–4.0)
MCH: 25.3 pg — ABNORMAL LOW (ref 26.0–34.0)
MCHC: 30.6 g/dL (ref 30.0–36.0)
MCV: 82.7 fL (ref 80.0–100.0)
Monocytes Absolute: 0.6 10*3/uL (ref 0.1–1.0)
Monocytes Relative: 11 %
Neutro Abs: 1.9 10*3/uL (ref 1.7–7.7)
Neutrophils Relative %: 35 %
Platelets: 307 10*3/uL (ref 150–400)
RBC: 4.5 MIL/uL (ref 3.87–5.11)
RDW: 15.2 % (ref 11.5–15.5)
WBC: 5.4 10*3/uL (ref 4.0–10.5)
nRBC: 0 % (ref 0.0–0.2)

## 2023-01-07 LAB — URINALYSIS, ROUTINE W REFLEX MICROSCOPIC
Bilirubin Urine: NEGATIVE
Glucose, UA: NEGATIVE mg/dL
Hgb urine dipstick: NEGATIVE
Ketones, ur: 40 mg/dL — AB
Leukocytes,Ua: NEGATIVE
Nitrite: NEGATIVE
Protein, ur: NEGATIVE mg/dL
Specific Gravity, Urine: 1.015 (ref 1.005–1.030)
pH: 7 (ref 5.0–8.0)

## 2023-01-07 LAB — COMPREHENSIVE METABOLIC PANEL
ALT: 21 U/L (ref 0–44)
AST: 21 U/L (ref 15–41)
Albumin: 3.5 g/dL (ref 3.5–5.0)
Alkaline Phosphatase: 129 U/L — ABNORMAL HIGH (ref 38–126)
Anion gap: 8 (ref 5–15)
BUN: 11 mg/dL (ref 8–23)
CO2: 26 mmol/L (ref 22–32)
Calcium: 8.9 mg/dL (ref 8.9–10.3)
Chloride: 104 mmol/L (ref 98–111)
Creatinine, Ser: 0.66 mg/dL (ref 0.44–1.00)
GFR, Estimated: >60 mL/min (ref >60)
Glucose, Bld: 90 mg/dL (ref 70–99)
Potassium: 3.5 mmol/L (ref 3.5–5.1)
Sodium: 138 mmol/L (ref 135–145)
Total Bilirubin: 0.5 mg/dL (ref 0.3–1.2)
Total Protein: 7.8 g/dL (ref 6.5–8.1)

## 2023-01-07 LAB — C-REACTIVE PROTEIN: CRP: 0.7 mg/dL (ref <1.0)

## 2023-01-07 LAB — RESP PANEL BY RT-PCR (RSV, FLU A&B, COVID)  RVPGX2
Influenza A by PCR: NEGATIVE
Influenza B by PCR: NEGATIVE
Resp Syncytial Virus by PCR: NEGATIVE
SARS Coronavirus 2 by RT PCR: NEGATIVE

## 2023-01-07 LAB — LACTIC ACID, PLASMA: Lactic Acid, Venous: 0.8 mmol/L (ref 0.5–1.9)

## 2023-01-07 LAB — LIPASE, BLOOD: Lipase: 24 U/L (ref 11–51)

## 2023-01-07 LAB — SEDIMENTATION RATE: Sed Rate: 73 mm/hr — ABNORMAL HIGH (ref 0–22)

## 2023-01-07 MED ORDER — VANCOMYCIN HCL IN DEXTROSE 1-5 GM/200ML-% IV SOLN
1000.0000 mg | Freq: Once | INTRAVENOUS | Status: AC
  Administered 2023-01-07: 1000 mg via INTRAVENOUS
  Filled 2023-01-07: qty 200

## 2023-01-07 MED ORDER — IOHEXOL 300 MG/ML  SOLN
100.0000 mL | Freq: Once | INTRAMUSCULAR | Status: AC | PRN
  Administered 2023-01-07: 100 mL via INTRAVENOUS

## 2023-01-07 MED ORDER — MORPHINE SULFATE (PF) 4 MG/ML IV SOLN
8.0000 mg | Freq: Once | INTRAVENOUS | Status: AC
  Administered 2023-01-07: 8 mg via INTRAVENOUS
  Filled 2023-01-07: qty 2

## 2023-01-07 MED ORDER — OXYCODONE HCL 5 MG PO TABS
5.0000 mg | ORAL_TABLET | Freq: Once | ORAL | Status: AC
  Administered 2023-01-07: 5 mg via ORAL
  Filled 2023-01-07: qty 1

## 2023-01-07 MED ORDER — CEFAZOLIN SODIUM-DEXTROSE 2-4 GM/100ML-% IV SOLN
2.0000 g | Freq: Once | INTRAVENOUS | Status: AC
  Administered 2023-01-07: 2 g via INTRAVENOUS
  Filled 2023-01-07: qty 100

## 2023-01-07 MED ORDER — VANCOMYCIN HCL 1500 MG/300ML IV SOLN
1500.0000 mg | INTRAVENOUS | Status: DC
  Filled 2023-01-07: qty 300

## 2023-01-07 MED ORDER — MORPHINE SULFATE (PF) 4 MG/ML IV SOLN
4.0000 mg | Freq: Once | INTRAVENOUS | Status: AC
  Administered 2023-01-07: 4 mg via INTRAVENOUS
  Filled 2023-01-07: qty 1

## 2023-01-07 MED ORDER — ONDANSETRON HCL 4 MG/2ML IJ SOLN
4.0000 mg | Freq: Once | INTRAMUSCULAR | Status: AC
  Administered 2023-01-07: 4 mg via INTRAVENOUS
  Filled 2023-01-07: qty 2

## 2023-01-07 NOTE — ED Triage Notes (Addendum)
Pt had surgery in February on her left hip.  Pt having issues with pain to left hip.  Pt states she has a fever of 103 yesterday.  Pt has incision to left hip, with serosanguinous drainage.  Pt also is having N/V with poor appetite.  Pt saw surgeon yesterday but is having difficulty getting her prescriptions filled.

## 2023-01-07 NOTE — ED Provider Notes (Signed)
[facility] Provider Note   CSN: [id number] Arrival date & time: 01/07/23  1659     History {Add pertinent medical, surgical, social history, OB history to HPI:1} Chief Complaint  Patient presents with   Post-op Problem    Erin Wolf is a 67 y.o. female.  HPI     67 year old female with a history of diabetes, hypertension, hyperlipidemia, seizure disorder, lupus, ulcerative colitis, chronic abdominal pain, chronic back pain, anxiety, who had a total left hip arthroplasty February 13 with Dr. Alvan Dame who presents with concern for drainage from her left hip and fever of 103 yesterday.   Past Medical History:  Diagnosis Date   Anxiety    Arthritis    Chronic abdominal pain    Chronic lower back pain    Chronic nausea    Depression    Diabetes mellitus without complication ([facility])    not on meds   Diverticulosis    Dyspnea    with exertion   Frequency of urination    GERD (gastroesophageal reflux disease)    Headache    Hematuria    Hepatitis    pt denies   Hepatitis C antibody positive in blood    per pt told by pcp   History of panic attacks    History of syncope    hx recurrent syncope --- per epic domentation non-cardiac , orthostatic hypotension, anxiety, dehydration   History of uterine fibroid    HTN (hypertension), benign    Hyperlipidemia 01/03/2012   IBS (irritable bowel syndrome)    Myalgia    Sciatic nerve disease, left 2023   Seizure disorder ([place]) followed by pcp until new neurologist appr. in jan 2019   "started having them in my 20's" ,  petit mal ----  per pt last seizure one 2015   SLE (systemic lupus erythematosus) [facility])    rheumatologist-- dr Gerilyn Nestle (consult 11-12-2016) having work-up done   Ulcerative colitis    followed by dr Penelope Coop at [place]   Wears glasses      Home Medications Prior to Admission medications   Medication Sig Start Date End Date Taking? Authorizing Provider   esomeprazole (NEXIUM) 40 MG capsule Take 40 mg by mouth daily as needed (acid reflux). 06/10/19   [provider]  FLUoxetine (PROZAC) 20 MG capsule Take 20 mg by mouth daily.     [provider]  furosemide (LASIX) 20 MG tablet Take 20 mg by mouth daily.    [provider]  HYDROmorphone (DILAUDID) 4 MG tablet Take 1 tablet (4 mg total) by mouth every 4 (four) hours as needed for severe pain. DO NOT take oxycodone while taking this medication. 12/04/22   Irving Copas, PA-C  Multiple Vitamins-Minerals (WOMENS MULTI PO) Take 1 tablet by mouth daily. Centrum    [provider]  naloxone [facility]) nasal spray 4 mg/0.1 mL Place 1 spray into the nose daily as needed (opioid overdose).    [provider]  Polyethyl Glycol-Propyl Glycol (SYSTANE OP) Place 1 drop into both eyes 2 (two) times daily as needed (dry eyes).    [provider]  polyethylene glycol (MIRALAX / GLYCOLAX) 17 g packet Take 17 g by mouth 2 (two) times daily. 12/04/22   Irving Copas, PA-C  rosuvastatin (CRESTOR) 10 MG tablet Take 10 mg by mouth every Monday.    [provider]  tiZANidine (ZANAFLEX) 4 MG tablet Take 1 tablet (4 mg total) by mouth  every 8 (eight) hours as needed for muscle spasms. 12/04/22   Irving Copas, PA-C      Allergies    Latex, Nsaids, Wellbutrin [bupropion hcl], Aspirin, Darvocet [propoxyphene n-acetaminophen], Tape, Toradol [ketorolac tromethamine], Tylenol [acetaminophen], Butalbital-apap-caffeine, Ciprofloxacin, Eszopiclone, Other, and Topiramate    Review of Systems   Review of Systems  Physical Exam Updated Vital Signs BP 123/77 (BP Location: Left Arm)   Pulse 92   Temp 98.2 F (36.8 C) (Oral)   Resp 20   Ht 5\' 5"  (1.651 m)   Wt 101.6 kg   SpO2 100%   BMI 37.28 kg/m  Physical Exam  ED Results / Procedures / Treatments   Labs (all labs ordered are listed, but only abnormal results are displayed) Labs Reviewed   LIPASE, BLOOD  COMPREHENSIVE METABOLIC PANEL  CBC  URINALYSIS, ROUTINE W REFLEX MICROSCOPIC    EKG None  Radiology No results found.  Procedures Procedures  {Document cardiac monitor, telemetry assessment procedure when appropriate:1}  Medications Ordered in ED Medications - No data to display  ED Course/ Medical Decision Making/ A&P   {   Click here for ABCD2, HEART and other calculatorsREFRESH Note before signing :1}                          Medical Decision Making Amount and/or Complexity of Data Reviewed Labs: ordered. Radiology: ordered.  Risk Prescription drug management.   ***  {Document critical care time when appropriate:1} {Document review of labs and clinical decision tools ie heart score, Chads2Vasc2 etc:1}  {Document your independent review of radiology images, and any outside records:1} {Document your discussion with family members, caretakers, and with consultants:1} {Document social determinants of health affecting pt's care:1} {Document your decision making why or why not admission, treatments were needed:1} Final Clinical Impression(s) / ED Diagnoses Final diagnoses:  None    Rx / DC Orders ED Discharge Orders     None

## 2023-01-07 NOTE — Progress Notes (Signed)
Pharmacy Antibiotic Note  Erin Wolf is a 67 y.o. female for which pharmacy has been consulted for vancomycin dosing for  wound infection .  Patient with a history of DM, HTN, HLD, seizure, lupus, UC. Pt s/p total left hip arthroplasty 12/03/22. Patient presenting with drainage from hip and fever.  SCr 0.66 WBC 5.4; LA 0.8; T 98.2; HR 90; RR 17  Plan: Ancef given x 1 in ED Vancomycin 2000 mg once then 1500 mg q24hr (eAUC 533.3) unless change in renal function Trend WBC, Fever, Renal function F/u cultures, clinical course, WBC De-escalate when able Levels at steady state  Height: 5\' 5"  (165.1 cm) Weight: 101.6 kg (224 lb) IBW/kg (Calculated) : 57  Temp (24hrs), Avg:98.2 F (36.8 C), Min:98.2 F (36.8 C), Max:98.2 F (36.8 C)  Recent Labs  Lab 01/07/23 1759 01/07/23 1847  WBC 5.4  --   CREATININE 0.66  --   LATICACIDVEN  --  0.8    Estimated Creatinine Clearance: 80.6 mL/min (by C-G formula based on SCr of 0.66 mg/dL).    Allergies  Allergen Reactions   Latex Other (See Comments)    Peels skin   Nsaids Other (See Comments)    HAS COLITIS FLARES   Wellbutrin [Bupropion Hcl] Other (See Comments)    Insomnia and headaches   Aspirin Nausea Only   Darvocet [Propoxyphene N-Acetaminophen] Nausea And Vomiting   Tape Hives   Toradol [Ketorolac Tromethamine] Nausea And Vomiting   Tylenol [Acetaminophen] Nausea Only   Butalbital-Apap-Caffeine     stomach upset   Ciprofloxacin     stomach upset   Eszopiclone Swelling   Other     Spicy foods cause diarrhea due to colitis   Topiramate     stomach upset   Microbiology results: Pending  Thank you for allowing pharmacy to be a part of this patient's care.  Lorelei Pont, PharmD, BCPS 01/07/2023 8:24 PM ED Clinical Pharmacist -  [phone]

## 2023-01-08 LAB — URINE CULTURE

## 2023-01-12 LAB — CULTURE, BLOOD (ROUTINE X 2)
Culture: NO GROWTH
Culture: NO GROWTH

## 2023-02-02 ENCOUNTER — Other Ambulatory Visit: Payer: Self-pay

## 2023-02-02 ENCOUNTER — Emergency Department (HOSPITAL_BASED_OUTPATIENT_CLINIC_OR_DEPARTMENT_OTHER)
Admission: EM | Admit: 2023-02-02 | Discharge: 2023-02-02 | Disposition: A | Discharge status: Home / Self Care | Payer: Medicare HMO | Attending: Emergency Medicine | Admitting: Emergency Medicine

## 2023-02-02 ENCOUNTER — Emergency Department (HOSPITAL_BASED_OUTPATIENT_CLINIC_OR_DEPARTMENT_OTHER): Payer: Medicare HMO

## 2023-02-02 ENCOUNTER — Encounter (HOSPITAL_BASED_OUTPATIENT_CLINIC_OR_DEPARTMENT_OTHER): Payer: Self-pay

## 2023-02-02 DIAGNOSIS — Z91040 Latex allergy status: Secondary | ICD-10-CM | POA: Diagnosis not present

## 2023-02-02 DIAGNOSIS — N3000 Acute cystitis without hematuria: Secondary | ICD-10-CM | POA: Insufficient documentation

## 2023-02-02 DIAGNOSIS — M25552 Pain in left hip: Secondary | ICD-10-CM | POA: Diagnosis not present

## 2023-02-02 DIAGNOSIS — R509 Fever, unspecified: Secondary | ICD-10-CM | POA: Diagnosis not present

## 2023-02-02 DIAGNOSIS — R1084 Generalized abdominal pain: Secondary | ICD-10-CM | POA: Diagnosis present

## 2023-02-02 DIAGNOSIS — Z1152 Encounter for screening for COVID-19: Secondary | ICD-10-CM | POA: Diagnosis not present

## 2023-02-02 LAB — CBC WITH DIFFERENTIAL/PLATELET
Abs Immature Granulocytes: 0.01 10*3/uL (ref 0.00–0.07)
Basophils Absolute: 0 10*3/uL (ref 0.0–0.1)
Basophils Relative: 0 %
Eosinophils Absolute: 0 10*3/uL (ref 0.0–0.5)
Eosinophils Relative: 0 %
HCT: 38.7 % (ref 36.0–46.0)
Hemoglobin: 11.8 g/dL — ABNORMAL LOW (ref 12.0–15.0)
Immature Granulocytes: 0 %
Lymphocytes Relative: 64 %
Lymphs Abs: 2.9 10*3/uL (ref 0.7–4.0)
MCH: 25.2 pg — ABNORMAL LOW (ref 26.0–34.0)
MCHC: 30.5 g/dL (ref 30.0–36.0)
MCV: 82.7 fL (ref 80.0–100.0)
Monocytes Absolute: 0.4 10*3/uL (ref 0.1–1.0)
Monocytes Relative: 9 %
Neutro Abs: 1.2 10*3/uL — ABNORMAL LOW (ref 1.7–7.7)
Neutrophils Relative %: 27 %
Platelets: 335 10*3/uL (ref 150–400)
RBC: 4.68 MIL/uL (ref 3.87–5.11)
RDW: 15.8 % — ABNORMAL HIGH (ref 11.5–15.5)
WBC: 4.6 10*3/uL (ref 4.0–10.5)
nRBC: 0 % (ref 0.0–0.2)

## 2023-02-02 LAB — URINALYSIS, ROUTINE W REFLEX MICROSCOPIC

## 2023-02-02 LAB — URINALYSIS, MICROSCOPIC (REFLEX): RBC / HPF: >50 RBC/hpf (ref 0–5)

## 2023-02-02 LAB — COMPREHENSIVE METABOLIC PANEL
ALT: 15 U/L (ref 0–44)
AST: 21 U/L (ref 15–41)
Albumin: 3.7 g/dL (ref 3.5–5.0)
Alkaline Phosphatase: 93 U/L (ref 38–126)
Anion gap: 8 (ref 5–15)
BUN: 13 mg/dL (ref 8–23)
CO2: 24 mmol/L (ref 22–32)
Calcium: 9.7 mg/dL (ref 8.9–10.3)
Chloride: 105 mmol/L (ref 98–111)
Creatinine, Ser: 0.67 mg/dL (ref 0.44–1.00)
GFR, Estimated: >60 mL/min (ref >60)
Glucose, Bld: 117 mg/dL — ABNORMAL HIGH (ref 70–99)
Potassium: 3.8 mmol/L (ref 3.5–5.1)
Sodium: 137 mmol/L (ref 135–145)
Total Bilirubin: 0.4 mg/dL (ref 0.3–1.2)
Total Protein: 8.1 g/dL (ref 6.5–8.1)

## 2023-02-02 LAB — RESP PANEL BY RT-PCR (RSV, FLU A&B, COVID)  RVPGX2
Influenza A by PCR: NEGATIVE
Influenza B by PCR: NEGATIVE
Resp Syncytial Virus by PCR: NEGATIVE
SARS Coronavirus 2 by RT PCR: NEGATIVE

## 2023-02-02 LAB — LIPASE, BLOOD: Lipase: 44 U/L (ref 11–51)

## 2023-02-02 MED ORDER — CEPHALEXIN 250 MG PO CAPS
500.0000 mg | ORAL_CAPSULE | Freq: Once | ORAL | Status: AC
  Administered 2023-02-02: 500 mg via ORAL
  Filled 2023-02-02: qty 2

## 2023-02-02 MED ORDER — MORPHINE SULFATE (PF) 4 MG/ML IV SOLN
4.0000 mg | Freq: Once | INTRAVENOUS | Status: AC
  Administered 2023-02-02: 4 mg via INTRAVENOUS
  Filled 2023-02-02: qty 1

## 2023-02-02 MED ORDER — CEPHALEXIN 500 MG PO CAPS: 500.0000 mg | ORAL_CAPSULE | Freq: Two times a day (BID) | ORAL | 0 refills | Status: AC

## 2023-02-02 MED ORDER — FENTANYL CITRATE PF 50 MCG/ML IJ SOSY
50.0000 ug | PREFILLED_SYRINGE | Freq: Once | INTRAMUSCULAR | Status: AC
  Administered 2023-02-02: 50 ug via INTRAVENOUS
  Filled 2023-02-02: qty 1

## 2023-02-02 MED ORDER — IOHEXOL 300 MG/ML  SOLN
100.0000 mL | Freq: Once | INTRAMUSCULAR | Status: AC | PRN
  Administered 2023-02-02: 100 mL via INTRAVENOUS

## 2023-02-02 MED ORDER — SODIUM CHLORIDE 0.9 % IV BOLUS
1000.0000 mL | Freq: Once | INTRAVENOUS | Status: AC
  Administered 2023-02-02: 1000 mL via INTRAVENOUS

## 2023-02-02 NOTE — ED Triage Notes (Addendum)
Pt presents with many complaints. Lower back pain and left hip pain. Pt fell getting of toilet this morning Mucous blood in stool. Fever up to 103.4. Main concern is having difficulty swallowing feeling like lump in chest. Feels weak. Hx of lupus. Breaks out in rashes on back. Feels dizzy, face swollen

## 2023-02-02 NOTE — ED Notes (Signed)
D/c paperwork reviewed with pt, including prescriptions and follow up care.  All questions and/or concerns addressed at time of d/c.  No further needs expressed. . Pt verbalized understanding, Wheeled by ED staff to ED exit, NAD.   

## 2023-02-02 NOTE — ED Notes (Signed)
Pt in imaging

## 2023-02-02 NOTE — ED Provider Notes (Signed)
[facility] Provider Note   CSN: [id number] Arrival date & time: 02/02/23  1443     History  Chief Complaint  Patient presents with   Abdominal Pain    Erin Wolf is a 67 y.o. female.  Patient here with abdominal pain, reflux symptoms, fever.  She fell today and has pain in her left hip on her previous surgery site.  She has been ambulatory since the fall.  She did not hit her head or lose consciousness.  She saw GI couple days ago for her dysphagia.  Sounds like she is scheduled for colonoscopy and EGD at some point.  She is on Nexium.  Seems like she has issues swallowing both solids and liquids.  She denies any current chest pain or shortness of breath.  She has had some cramping abdominal pain all over for the last few days as well.  No specific pain with urination.  No headache numbness or chills.  The history is provided by the patient.       Home Medications Prior to Admission medications   Medication Sig Start Date End Date Taking? Authorizing Provider  cephALEXin (KEFLEX) 500 MG capsule Take 1 capsule (500 mg total) by mouth 2 (two) times daily for 5 days. 02/02/23 02/07/23 Yes Joandy Burget, DO  esomeprazole (NEXIUM) 40 MG capsule Take 40 mg by mouth daily as needed (acid reflux). 06/10/19   [provider]  FLUoxetine (PROZAC) 20 MG capsule Take 20 mg by mouth daily.     [provider]  furosemide (LASIX) 20 MG tablet Take 20 mg by mouth daily.    [provider]  HYDROmorphone (DILAUDID) 4 MG tablet Take 1 tablet (4 mg total) by mouth every 4 (four) hours as needed for severe pain. DO NOT take oxycodone while taking this medication. 12/04/22   Cassandria Anger, PA-C  Multiple Vitamins-Minerals (WOMENS MULTI PO) Take 1 tablet by mouth daily. Centrum    [provider]  naloxone [facility]) nasal spray 4 mg/0.1 mL Place 1 spray into the nose daily as needed (opioid overdose).    [provider]  Polyethyl Glycol-Propyl Glycol (SYSTANE OP) Place 1 drop into both eyes 2 (two) times daily as needed (dry eyes).    [provider]  polyethylene glycol (MIRALAX / GLYCOLAX) 17 g packet Take 17 g by mouth 2 (two) times daily. 12/04/22   Cassandria Anger, PA-C  rosuvastatin (CRESTOR) 10 MG tablet Take 10 mg by mouth every Monday.    [provider]  tiZANidine (ZANAFLEX) 4 MG tablet Take 1 tablet (4 mg total) by mouth every 8 (eight) hours as needed for muscle spasms. 12/04/22   Cassandria Anger, PA-C      Allergies    Latex, Nsaids, Wellbutrin [bupropion hcl], Aspirin, Darvocet [propoxyphene n-acetaminophen], Tape, Toradol [ketorolac tromethamine], Tylenol [acetaminophen], Butalbital-apap-caffeine, Ciprofloxacin, Eszopiclone, Other, and Topiramate    Review of Systems   Review of Systems  Physical Exam Updated Vital Signs BP (!) 139/95 (BP Location: Right Arm)   Pulse (!) 108   Temp 99.2 F (37.3 C) (Oral)   Resp 18   SpO2 99%  Physical Exam Vitals and nursing note reviewed.  Constitutional:      General: She is not in acute distress.    Appearance: She is well-developed.  HENT:     Head: Normocephalic and atraumatic.     Mouth/Throat:     Mouth: Mucous membranes are moist.  Eyes:  Conjunctiva/sclera: Conjunctivae normal.  Cardiovascular:     Rate and Rhythm: Normal rate and regular rhythm.     Heart sounds: Normal heart sounds. No murmur heard. Pulmonary:     Effort: Pulmonary effort is normal. No respiratory distress.     Breath sounds: Normal breath sounds.  Abdominal:     Palpations: Abdomen is soft.     Tenderness: There is generalized abdominal tenderness.  Musculoskeletal:        General: No swelling.     Cervical back: Neck supple.  Skin:    General: Skin is warm and dry.     Capillary Refill: Capillary refill takes less than 2 seconds.  Neurological:     Mental Status: She is alert.  Psychiatric:        Mood and Affect:  Mood normal.     ED Results / Procedures / Treatments   Labs (all labs ordered are listed, but only abnormal results are displayed) Labs Reviewed  CBC WITH DIFFERENTIAL/PLATELET - Abnormal; Notable for the following components:      Result Value   Hemoglobin 11.8 (*)    MCH 25.2 (*)    RDW 15.8 (*)    Neutro Abs 1.2 (*)    All other components within normal limits  COMPREHENSIVE METABOLIC PANEL - Abnormal; Notable for the following components:   Glucose, Bld 117 (*)    All other components within normal limits  URINALYSIS, ROUTINE W REFLEX MICROSCOPIC - Abnormal; Notable for the following components:   Color, Urine AMBER (*)    APPearance TURBID (*)    Glucose, UA   (*)    Value: TEST NOT REPORTED DUE TO COLOR INTERFERENCE OF URINE PIGMENT   Hgb urine dipstick   (*)    Value: TEST NOT REPORTED DUE TO COLOR INTERFERENCE OF URINE PIGMENT   Bilirubin Urine   (*)    Value: TEST NOT REPORTED DUE TO COLOR INTERFERENCE OF URINE PIGMENT   Ketones, ur   (*)    Value: TEST NOT REPORTED DUE TO COLOR INTERFERENCE OF URINE PIGMENT   Protein, ur   (*)    Value: TEST NOT REPORTED DUE TO COLOR INTERFERENCE OF URINE PIGMENT   Nitrite   (*)    Value: TEST NOT REPORTED DUE TO COLOR INTERFERENCE OF URINE PIGMENT   Leukocytes,Ua   (*)    Value: TEST NOT REPORTED DUE TO COLOR INTERFERENCE OF URINE PIGMENT   All other components within normal limits  URINALYSIS, MICROSCOPIC (REFLEX) - Abnormal; Notable for the following components:   Bacteria, UA MANY (*)    All other components within normal limits  RESP PANEL BY RT-PCR (RSV, FLU A&B, COVID)  [id number]  URINE CULTURE  LIPASE, BLOOD    EKG EKG Interpretation  Date/Time:  Sunday February 02 2023 15:31:04 EDT Ventricular Rate:  87 PR Interval:  177 QRS Duration: 81 QT Interval:  351 QTC Calculation: 423 R Axis:   40 Text Interpretation: Sinus rhythm Confirmed by Virgina Norfolk ([id number]) on 02/02/2023 3:39:44 PM  Radiology CT L-SPINE NO  CHARGE  Result Date: 02/02/2023 CLINICAL DATA:  Low back pain. Fall getting off toilet this morning. EXAM: CT LUMBAR SPINE WITHOUT CONTRAST TECHNIQUE: Multidetector CT imaging of the lumbar spine was performed without intravenous contrast administration. Multiplanar CT image reconstructions were also generated. RADIATION DOSE REDUCTION: This exam was performed according to the departmental dose-optimization program which includes automated exposure control, adjustment of the mA and/or kV according to patient size and/or use of iterative  reconstruction technique. COMPARISON:  Reformats from abdominopelvic CT 01/07/2023 FINDINGS: Segmentation: 5 lumbar type vertebrae. Alignment: Normal. Vertebrae: No acute fracture. Vertebral body heights are normal. The posterior elements are intact. Intact sacrum. Paraspinal and other soft tissues: Assessed on concurrent abdominopelvic CT, reported separately. Disc levels: Prominent disc space narrowing with anterior and posterior spurring and vacuum phenomenon L3-L4, L4-L5, and L5-S1. Facet hypertrophy is seen at these levels. Lesser facet hypertrophy is seen at L2-L3. There is spinal canal and neural foraminal stenosis at L3-L4 and L4-L5. IMPRESSION: 1. No fracture or subluxation of the lumbar spine. 2. Multilevel degenerative disc disease and facet hypertrophy. There is spinal canal and neural foraminal stenosis at L3-L4 and L4-L5, more prominently affecting the latter. Electronically Signed   By: Narda Rutherford M.D.   On: 02/02/2023 17:32   CT ABDOMEN PELVIS W CONTRAST  Result Date: 02/02/2023 CLINICAL DATA:  Acute abdominal pain. Low back and left hip pain. Fall off toilet this morning. EXAM: CT ABDOMEN AND PELVIS WITH CONTRAST TECHNIQUE: Multidetector CT imaging of the abdomen and pelvis was performed using the standard protocol following bolus administration of intravenous contrast. RADIATION DOSE REDUCTION: This exam was performed according to the departmental  dose-optimization program which includes automated exposure control, adjustment of the mA and/or kV according to patient size and/or use of iterative reconstruction technique. CONTRAST:  OMNIPAQUE IOHEXOL 300 MG/ML  SOLN COMPARISON:  Abdominopelvic CT 01/07/2023 FINDINGS: Lower chest: Included portions of the lung bases are clear. Hepatobiliary: Hepatic dome is excluded from the field of view. Stable intra and extrahepatic biliary ductal dilatation post cholecystectomy. No focal liver lesions. Mild hepatic steatosis. Pancreas: Chronic pancreatic ductal dilatation and parenchymal atrophy. No inflammatory change. Spleen: Normal in size without focal abnormality. 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 No follow-up imaging is needed. Normal right adrenal gland. No hydronephrosis or perinephric edema. Homogeneous renal enhancement with symmetric excretion on delayed phase imaging. Urinary bladder is empty and not well assessed. Stomach/Bowel: There is no bowel obstruction or inflammation. Mild left colonic diverticulosis without diverticulitis. High-riding cecum in the right mid abdomen. There is a prominent segment of sigmoid colon that is stool filled with transition to nondilated colon, series 2, image 62. No definite colonic wall thickening, however similar transition was seen on prior exam. The appendix is not seen. Vascular/Lymphatic: Mild aorto bi-iliac atherosclerosis. No aneurysm. No acute vascular findings. No abdominopelvic adenopathy. Reproductive: Status post hysterectomy. No adnexal masses. Other: No ascites, free air or intra-abdominal fluid collection. Musculoskeletal: Left hip arthroplasty. No periprosthetic fracture. There is no pelvic fracture. Lumbar spine is assessed on concurrent lumbar spine exam. Small fluid collection lateral to the hip arthroplasty. This measures approximately 2.6 x 1.8 x 9.8 cm, series 2, image 65 and series 5, image 62, and is  diminished from prior exam. IMPRESSION: 1. No acute findings in the abdomen or pelvis. 2. Left hip arthroplasty without periprosthetic fracture. Small fluid collection lateral to the hip arthroplasty is diminished in size from prior exam, likely resolving postsurgical change. Sterility is technically indeterminate, recommend clinical correlation for infection. 3. Chronic intra and extrahepatic biliary ductal dilatation post cholecystectomy, unchanged. 4. Mild left colonic diverticulosis without diverticulitis. Segment of sigmoid colon that is stool filled with transition to nondilated colon. No definite colonic wall thickening, however similar transition was seen on prior exam. Recommend up-to-date colonoscopy to exclude the possibility of underlying colonic neoplasm. Aortic Atherosclerosis (ICD10-I70.0). Electronically Signed   By: Narda Rutherford M.D.   On:  02/02/2023 17:28    Procedures Procedures    Medications Ordered in ED Medications  cephALEXin (KEFLEX) capsule 500 mg (has no administration in time range)  sodium chloride 0.9 % bolus 1,000 mL (0 mLs Intravenous Stopped 02/02/23 1722)  fentaNYL (SUBLIMAZE) injection 50 mcg (50 mcg Intravenous Given 02/02/23 1643)  iohexol (OMNIPAQUE) 300 MG/ML solution 100 mL (100 mLs Intravenous Contrast Given 02/02/23 1626)  morphine (PF) 4 MG/ML injection 4 mg (4 mg Intravenous Given 02/02/23 1719)    ED Course/ Medical Decision Making/ A&P                             Medical Decision Making Amount and/or Complexity of Data Reviewed Labs: ordered. Radiology: ordered.  Risk Prescription drug management.   Rayvon Char Levi is here with abdominal pain.  History of ulcerative colitis, lupus, reflux, chronic back pain chronic abdominal pain and IBS.  Most of her symptoms are GI related.  Cramping abdominal pain.  Some dysphagia at times.  Feels like food gets stuck at times.  But she has been able to eat and drink.  She saw GI couple days ago and she is  scheduled for colonoscopy and EGD.  She denies any chest pain or shortness of breath.  Differential diagnosis possibly colitis versus ongoing reflux pain versus acute on chronic abdominal pain.  Could be IBS flare.  Will give fluid bolus, check basic labs and get a CT scan abdomen pelvis.  Vital signs unremarkable.  No fever.  Per radiology report of CT scan is no acute findings.  She is already arranged for colonoscopy.  Urinalysis appears consistent with infection.  She has no significant leukocytosis or anemia or electrolyte abnormality.  I do not have any concern for infectious process at her surgical site of her left hip.  Overall there is improvement here on CT.  Will prescribe her Keflex.  Discharged in good condition.  This chart was dictated using voice recognition software.  Despite best efforts to proofread,  errors can occur which can change the documentation meaning.         Final Clinical Impression(s) / ED Diagnoses Final diagnoses:  Acute cystitis without hematuria    Rx / DC Orders ED Discharge Orders          Ordered    cephALEXin (KEFLEX) 500 MG capsule  2 times daily        02/02/23 1740              [address], DO 02/02/23 1741

## 2023-02-04 LAB — URINE CULTURE

## 2023-03-03 DIAGNOSIS — M47816 Spondylosis without myelopathy or radiculopathy, lumbar region: Secondary | ICD-10-CM | POA: Insufficient documentation

## 2023-03-03 DIAGNOSIS — M545 Low back pain, unspecified: Secondary | ICD-10-CM | POA: Insufficient documentation

## 2023-03-30 ENCOUNTER — Encounter (HOSPITAL_BASED_OUTPATIENT_CLINIC_OR_DEPARTMENT_OTHER): Payer: Self-pay | Admitting: Emergency Medicine

## 2023-03-30 ENCOUNTER — Emergency Department (HOSPITAL_BASED_OUTPATIENT_CLINIC_OR_DEPARTMENT_OTHER): Payer: Medicare HMO

## 2023-03-30 ENCOUNTER — Emergency Department (HOSPITAL_BASED_OUTPATIENT_CLINIC_OR_DEPARTMENT_OTHER)
Admission: EM | Admit: 2023-03-30 | Discharge: 2023-03-30 | Disposition: A | Discharge status: Home / Self Care | Payer: Medicare HMO | Attending: Emergency Medicine | Admitting: Emergency Medicine

## 2023-03-30 DIAGNOSIS — Z91040 Latex allergy status: Secondary | ICD-10-CM | POA: Diagnosis not present

## 2023-03-30 DIAGNOSIS — Z96653 Presence of artificial knee joint, bilateral: Secondary | ICD-10-CM | POA: Diagnosis not present

## 2023-03-30 DIAGNOSIS — Z96642 Presence of left artificial hip joint: Secondary | ICD-10-CM | POA: Insufficient documentation

## 2023-03-30 DIAGNOSIS — Z79899 Other long term (current) drug therapy: Secondary | ICD-10-CM | POA: Diagnosis not present

## 2023-03-30 DIAGNOSIS — M25552 Pain in left hip: Secondary | ICD-10-CM | POA: Insufficient documentation

## 2023-03-30 DIAGNOSIS — I10 Essential (primary) hypertension: Secondary | ICD-10-CM | POA: Insufficient documentation

## 2023-03-30 DIAGNOSIS — E119 Type 2 diabetes mellitus without complications: Secondary | ICD-10-CM | POA: Diagnosis not present

## 2023-03-30 LAB — URINALYSIS, ROUTINE W REFLEX MICROSCOPIC
Bilirubin Urine: NEGATIVE
Glucose, UA: NEGATIVE mg/dL
Hgb urine dipstick: NEGATIVE
Ketones, ur: NEGATIVE mg/dL
Leukocytes,Ua: NEGATIVE
Nitrite: NEGATIVE
Protein, ur: NEGATIVE mg/dL
Specific Gravity, Urine: >=1.03 (ref 1.005–1.030)
pH: 5.5 (ref 5.0–8.0)

## 2023-03-30 LAB — CBC
HCT: 42 % (ref 36.0–46.0)
Hemoglobin: 12.9 g/dL (ref 12.0–15.0)
MCH: 25 pg — ABNORMAL LOW (ref 26.0–34.0)
MCHC: 30.7 g/dL (ref 30.0–36.0)
MCV: 81.2 fL (ref 80.0–100.0)
Platelets: 305 10*3/uL (ref 150–400)
RBC: 5.17 MIL/uL — ABNORMAL HIGH (ref 3.87–5.11)
RDW: 16.6 % — ABNORMAL HIGH (ref 11.5–15.5)
WBC: 4.4 10*3/uL (ref 4.0–10.5)
nRBC: 0 % (ref 0.0–0.2)

## 2023-03-30 LAB — COMPREHENSIVE METABOLIC PANEL
ALT: 15 U/L (ref 0–44)
AST: 18 U/L (ref 15–41)
Albumin: 3.7 g/dL (ref 3.5–5.0)
Alkaline Phosphatase: 86 U/L (ref 38–126)
Anion gap: 11 (ref 5–15)
BUN: 11 mg/dL (ref 8–23)
CO2: 22 mmol/L (ref 22–32)
Calcium: 8.5 mg/dL — ABNORMAL LOW (ref 8.9–10.3)
Chloride: 103 mmol/L (ref 98–111)
Creatinine, Ser: 0.63 mg/dL (ref 0.44–1.00)
GFR, Estimated: >60 mL/min (ref >60)
Glucose, Bld: 98 mg/dL (ref 70–99)
Potassium: 3.4 mmol/L — ABNORMAL LOW (ref 3.5–5.1)
Sodium: 136 mmol/L (ref 135–145)
Total Bilirubin: 0.4 mg/dL (ref 0.3–1.2)
Total Protein: 7.9 g/dL (ref 6.5–8.1)

## 2023-03-30 LAB — LIPASE, BLOOD: Lipase: 29 U/L (ref 11–51)

## 2023-03-30 MED ORDER — ONDANSETRON HCL 4 MG/2ML IJ SOLN
4.0000 mg | Freq: Once | INTRAMUSCULAR | Status: AC
  Administered 2023-03-30: 4 mg via INTRAVENOUS
  Filled 2023-03-30: qty 2

## 2023-03-30 MED ORDER — LIDOCAINE 5 % EX PTCH
1.0000 | MEDICATED_PATCH | CUTANEOUS | Status: DC
  Administered 2023-03-30: 1 via TRANSDERMAL
  Filled 2023-03-30: qty 1

## 2023-03-30 MED ORDER — FENTANYL CITRATE PF 50 MCG/ML IJ SOSY
25.0000 ug | PREFILLED_SYRINGE | Freq: Once | INTRAMUSCULAR | Status: AC
  Administered 2023-03-30: 25 ug via INTRAVENOUS
  Filled 2023-03-30: qty 1

## 2023-03-30 MED ORDER — FENTANYL CITRATE PF 50 MCG/ML IJ SOSY: 25.0000 ug | PREFILLED_SYRINGE | Freq: Once | INTRAMUSCULAR | Status: DC

## 2023-03-30 MED ORDER — LIDOCAINE 4 % EX PTCH: 1.0000 | MEDICATED_PATCH | CUTANEOUS | 0 refills | Status: DC

## 2023-03-30 MED ORDER — MORPHINE SULFATE (PF) 4 MG/ML IV SOLN
4.0000 mg | Freq: Once | INTRAVENOUS | Status: AC
  Administered 2023-03-30: 4 mg via INTRAVENOUS
  Filled 2023-03-30: qty 1

## 2023-03-30 MED ORDER — ONDANSETRON 4 MG PO TBDP: 4.0000 mg | ORAL_TABLET | Freq: Three times a day (TID) | ORAL | 0 refills | Status: DC | PRN

## 2023-03-30 NOTE — ED Provider Notes (Signed)
[place] [facility] Provider Note   CSN: [id number] Arrival date & time: 03/30/23  1304     History {Add pertinent medical, surgical, social history, OB history to HPI:1} Chief Complaint  Patient presents with   Hip Pain    Erin Wolf is a 67 y.o. female.   Hip Pain       Home Medications Prior to Admission medications   Medication Sig Start Date End Date Taking? Authorizing Provider  lidocaine (HM LIDOCAINE PATCH) 4 % Place 1 patch onto the skin daily. 03/30/23  Yes Jeanelle Malling, PA  ondansetron (ZOFRAN-ODT) 4 MG disintegrating tablet Take 1 tablet (4 mg total) by mouth every 8 (eight) hours as needed. 03/30/23  Yes Jeanelle Malling, PA  esomeprazole (NEXIUM) 40 MG capsule Take 40 mg by mouth daily as needed (acid reflux). 06/10/19   [provider]  FLUoxetine (PROZAC) 20 MG capsule Take 20 mg by mouth daily.     [provider]  furosemide (LASIX) 20 MG tablet Take 20 mg by mouth daily.    [provider]  HYDROmorphone (DILAUDID) 4 MG tablet Take 1 tablet (4 mg total) by mouth every 4 (four) hours as needed for severe pain. DO NOT take oxycodone while taking this medication. 12/04/22   Cassandria Anger, PA-C  Multiple Vitamins-Minerals (WOMENS MULTI PO) Take 1 tablet by mouth daily. Centrum    [provider]  naloxone [facility]) nasal spray 4 mg/0.1 mL Place 1 spray into the nose daily as needed (opioid overdose).    [provider]  Polyethyl Glycol-Propyl Glycol (SYSTANE OP) Place 1 drop into both eyes 2 (two) times daily as needed (dry eyes).    [provider]  polyethylene glycol (MIRALAX / GLYCOLAX) 17 g packet Take 17 g by mouth 2 (two) times daily. 12/04/22   Cassandria Anger, PA-C  rosuvastatin (CRESTOR) 10 MG tablet Take 10 mg by mouth every Monday.    [provider]  tiZANidine (ZANAFLEX) 4 MG tablet Take 1 tablet (4 mg total) by mouth every 8 (eight) hours as needed for muscle  spasms. 12/04/22   Cassandria Anger, PA-C      Allergies    Latex, Nsaids, Wellbutrin [bupropion hcl], Aspirin, Darvocet [propoxyphene n-acetaminophen], Tape, Toradol [ketorolac tromethamine], Tylenol [acetaminophen], Butalbital-apap-caffeine, Ciprofloxacin, Eszopiclone, Other, and Topiramate    Review of Systems   Review of Systems  Physical Exam Updated Vital Signs BP 137/80   Pulse 86   Temp 97.7 F (36.5 C) (Oral)   Resp 19   Ht 5\' 4"  (1.626 m)   Wt 103 kg   SpO2 100%   BMI 38.96 kg/m  Physical Exam  ED Results / Procedures / Treatments   Labs (all labs ordered are listed, but only abnormal results are displayed) Labs Reviewed  COMPREHENSIVE METABOLIC PANEL - Abnormal; Notable for the following components:      Result Value   Potassium 3.4 (*)    Calcium 8.5 (*)    All other components within normal limits  CBC - Abnormal; Notable for the following components:   RBC 5.17 (*)    MCH 25.0 (*)    RDW 16.6 (*)    All other components within normal limits  URINALYSIS, ROUTINE W REFLEX MICROSCOPIC - Abnormal; Notable for the following components:   APPearance CLOUDY (*)    All other components within normal limits  LIPASE, BLOOD    EKG None  Radiology DG Hip Unilat W or  Wo Pelvis 2-3 Views Left  Result Date: 03/30/2023 CLINICAL DATA:  Pain after trauma EXAM: DG HIP (WITH OR WITHOUT PELVIS) 3V LEFT COMPARISON:  12/03/2022 FINDINGS: Left hip arthroplasty seen. Press-Fit femoral component and screw fixated acetabular cup. No evidence of hardware failure. Otherwise no fracture or dislocation. Preserved joint spaces. Hyperostosis. There are degenerative changes seen of the lower lumbar spine at the edge of the imaging field. There are several injection granulomas about the pelvis as well as presumed vascular calcifications in the central pelvis. IMPRESSION: Left hip arthroplasty.  No acute osseous abnormality. Electronically Signed   By: Karen Kays M.D.   On: 03/30/2023  13:31    Procedures Procedures  {Document cardiac monitor, telemetry assessment procedure when appropriate:1}  Medications Ordered in ED Medications  lidocaine (LIDODERM) 5 % 1 patch (1 patch Transdermal Patch Applied 03/30/23 1457)  fentaNYL (SUBLIMAZE) injection 25 mcg (25 mcg Intravenous Given 03/30/23 1456)  ondansetron (ZOFRAN) injection 4 mg (4 mg Intravenous Given 03/30/23 1650)  morphine (PF) 4 MG/ML injection 4 mg (4 mg Intravenous Given 03/30/23 1650)    ED Course/ Medical Decision Making/ A&P   {   Click here for ABCD2, HEART and other calculatorsREFRESH Note before signing :1}                          Medical Decision Making Amount and/or Complexity of Data Reviewed Labs: ordered. Radiology: ordered.  Risk OTC drugs. Prescription drug management.   ***  {Document critical care time when appropriate:1} {Document review of labs and clinical decision tools ie heart score, Chads2Vasc2 etc:1}  {Document your independent review of radiology images, and any outside records:1} {Document your discussion with family members, caretakers, and with consultants:1} {Document social determinants of health affecting pt's care:1} {Document your decision making why or why not admission, treatments were needed:1} Final Clinical Impression(s) / ED Diagnoses Final diagnoses:  Left hip pain    Rx / DC Orders ED Discharge Orders          Ordered    ondansetron (ZOFRAN-ODT) 4 MG disintegrating tablet  Every 8 hours PRN        03/30/23 1715    lidocaine (HM LIDOCAINE PATCH) 4 %  Every 24 hours        03/30/23 1715

## 2023-03-30 NOTE — Discharge Instructions (Addendum)
Please take your medications as prescribed. Take tylenol/ibuprofen for pain. I recommend close follow-up with PCP for reevaluation.  Please do not hesitate to return to emergency department if worrisome signs symptoms we discussed become apparent.  

## 2023-03-30 NOTE — ED Triage Notes (Signed)
Pt c/o LT hip pain; sts she bumped it yesterday (hip replaced 2/24); also reports NVD; sts hx of colitis and this usually gets exacerbated when she has pain elsewhere

## 2023-03-30 NOTE — ED Notes (Signed)
D/c paperwork reviewed with pt, including prescriptions and follow up care.  No questions or concerns voiced at time of d/c. . Pt verbalized understanding, Wheeled by ED staff to ED exit, NAD.   

## 2023-05-13 LAB — HM DIABETES EYE EXAM

## 2023-05-16 ENCOUNTER — Encounter (HOSPITAL_COMMUNITY): Payer: Self-pay | Admitting: *Deleted

## 2023-05-16 ENCOUNTER — Emergency Department (HOSPITAL_COMMUNITY): Payer: Medicare HMO

## 2023-05-16 ENCOUNTER — Other Ambulatory Visit: Payer: Self-pay

## 2023-05-16 ENCOUNTER — Emergency Department (HOSPITAL_COMMUNITY)
Admission: EM | Admit: 2023-05-16 | Discharge: 2023-05-16 | Disposition: A | Discharge status: Home / Self Care | Payer: Medicare HMO | Attending: Emergency Medicine | Admitting: Emergency Medicine

## 2023-05-16 DIAGNOSIS — Z1152 Encounter for screening for COVID-19: Secondary | ICD-10-CM | POA: Diagnosis not present

## 2023-05-16 DIAGNOSIS — M5410 Radiculopathy, site unspecified: Secondary | ICD-10-CM | POA: Diagnosis not present

## 2023-05-16 DIAGNOSIS — Z96642 Presence of left artificial hip joint: Secondary | ICD-10-CM | POA: Insufficient documentation

## 2023-05-16 DIAGNOSIS — R0602 Shortness of breath: Secondary | ICD-10-CM | POA: Diagnosis not present

## 2023-05-16 DIAGNOSIS — M25552 Pain in left hip: Secondary | ICD-10-CM | POA: Diagnosis present

## 2023-05-16 DIAGNOSIS — Z91040 Latex allergy status: Secondary | ICD-10-CM | POA: Insufficient documentation

## 2023-05-16 LAB — CBC WITH DIFFERENTIAL/PLATELET
Abs Immature Granulocytes: 0 10*3/uL (ref 0.00–0.07)
Basophils Absolute: 0 10*3/uL (ref 0.0–0.1)
Basophils Relative: 0 %
Eosinophils Absolute: 0 10*3/uL (ref 0.0–0.5)
Eosinophils Relative: 0 %
HCT: 44.9 % (ref 36.0–46.0)
Hemoglobin: 13.6 g/dL (ref 12.0–15.0)
Immature Granulocytes: 0 %
Lymphocytes Relative: 61 %
Lymphs Abs: 2.7 10*3/uL (ref 0.7–4.0)
MCH: 25 pg — ABNORMAL LOW (ref 26.0–34.0)
MCHC: 30.3 g/dL (ref 30.0–36.0)
MCV: 82.4 fL (ref 80.0–100.0)
Monocytes Absolute: 0.4 10*3/uL (ref 0.1–1.0)
Monocytes Relative: 9 %
Neutro Abs: 1.3 10*3/uL — ABNORMAL LOW (ref 1.7–7.7)
Neutrophils Relative %: 30 %
Platelets: 268 10*3/uL (ref 150–400)
RBC: 5.45 MIL/uL — ABNORMAL HIGH (ref 3.87–5.11)
RDW: 17 % — ABNORMAL HIGH (ref 11.5–15.5)
WBC: 4.4 10*3/uL (ref 4.0–10.5)
nRBC: 0 % (ref 0.0–0.2)

## 2023-05-16 LAB — BRAIN NATRIURETIC PEPTIDE: B Natriuretic Peptide: 9.6 pg/mL (ref 0.0–100.0)

## 2023-05-16 LAB — BASIC METABOLIC PANEL
Anion gap: 10 (ref 5–15)
BUN: 12 mg/dL (ref 8–23)
CO2: 25 mmol/L (ref 22–32)
Calcium: 9.5 mg/dL (ref 8.9–10.3)
Chloride: 105 mmol/L (ref 98–111)
Creatinine, Ser: 0.67 mg/dL (ref 0.44–1.00)
GFR, Estimated: >60 mL/min (ref >60)
Glucose, Bld: 85 mg/dL (ref 70–99)
Potassium: 4.2 mmol/L (ref 3.5–5.1)
Sodium: 140 mmol/L (ref 135–145)

## 2023-05-16 LAB — SARS CORONAVIRUS 2 BY RT PCR: SARS Coronavirus 2 by RT PCR: NEGATIVE

## 2023-05-16 LAB — TROPONIN I (HIGH SENSITIVITY): Troponin I (High Sensitivity): 3 ng/L (ref <18)

## 2023-05-16 MED ORDER — HYDROMORPHONE HCL 1 MG/ML IJ SOLN
1.0000 mg | Freq: Once | INTRAMUSCULAR | Status: AC | PRN
  Administered 2023-05-16: 1 mg via INTRAVENOUS
  Filled 2023-05-16: qty 1

## 2023-05-16 MED ORDER — ONDANSETRON HCL 4 MG/2ML IJ SOLN
4.0000 mg | Freq: Once | INTRAMUSCULAR | Status: AC
  Administered 2023-05-16: 4 mg via INTRAVENOUS
  Filled 2023-05-16: qty 2

## 2023-05-16 MED ORDER — HYDROMORPHONE HCL 1 MG/ML IJ SOLN
1.0000 mg | Freq: Once | INTRAMUSCULAR | Status: AC
  Administered 2023-05-16: 1 mg via INTRAVENOUS
  Filled 2023-05-16: qty 1

## 2023-05-16 MED ORDER — IOHEXOL 350 MG/ML SOLN
75.0000 mL | Freq: Once | INTRAVENOUS | Status: AC | PRN
  Administered 2023-05-16: 75 mL via INTRAVENOUS

## 2023-05-16 MED ORDER — SODIUM CHLORIDE (PF) 0.9 % IJ SOLN
INTRAMUSCULAR | Status: AC
  Filled 2023-05-16: qty 50

## 2023-05-16 NOTE — ED Triage Notes (Signed)
Pt c/o heart palpations, shob, diarrhea, stumbled against cabinet today and has left hip discomfort. Husband assisted her up.

## 2023-05-16 NOTE — Discharge Instructions (Addendum)
You were seen in the emergency room for shortness of breath.  You had an extensive workup including blood test and a CT scan of your lungs.  Thankfully, no emergency conditions were found.  We do not see evidence of pneumonia, blood clots, collapsed lung, anemia, Covid illness, or other life-threatening causes of shortness of breath.  You may be experiencing shortness of breath from anxiety or from pain from your hip.  You do likely have nerve pain from your back radiating into your left leg.  I would recommend you follow-up with your orthopedic doctor for this issue.  If your breathing becomes more labored, you should return to the emergency department.

## 2023-05-16 NOTE — ED Provider Notes (Signed)
Erin Wolf Provider Note   CSN: [id number] Arrival date & time: 05/16/23  1418     History  Chief Complaint  Patient presents with   Shortness of Breath   Diarrhea   Fall    Erin Wolf is a 67 y.o. 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  Patient reports that she stumbled and struck her left hip on the counter yesterday, now having pain radiating down her left leg.  She has had a left hip replacement.  She says that this morning she woke up patient noted that she was having labored breathing which is unusual.  She says she feels she cannot catch her breath.  She also reports has been having some loose bowel movements for several days.  She denies history of heart failure or COPD.  She denies chest pain  HPI     Home Medications Prior to Admission medications   Medication Sig Start Date End Date Taking? Authorizing Provider  esomeprazole (NEXIUM) 40 MG capsule Take 40 mg by mouth daily as needed (acid reflux). 06/10/19   [provider]  FLUoxetine (PROZAC) 20 MG capsule Take 20 mg by mouth daily.     [provider]  furosemide (LASIX) 20 MG tablet Take 20 mg by mouth daily.    [provider]  HYDROmorphone (DILAUDID) 4 MG tablet Take 1 tablet (4 mg total) by mouth every 4 (four) hours as needed for severe pain. DO NOT take oxycodone while taking this medication. 12/04/22   Erin Anger, PA-C  lidocaine (HM LIDOCAINE PATCH) 4 % Place 1 patch onto the skin daily. 03/30/23   Jeanelle Malling, PA  Multiple Vitamins-Minerals (WOMENS MULTI PO) Take 1 tablet by mouth daily. Centrum    [provider]  naloxone [facility]) nasal spray 4 mg/0.1 mL Place 1 spray into the nose daily as needed (opioid overdose).    [provider]  ondansetron (ZOFRAN-ODT) 4 MG disintegrating tablet Take 1 tablet (4 mg total) by mouth every 8 (eight) hours as needed. 03/30/23    Jeanelle Malling, PA  Polyethyl Glycol-Propyl Glycol (SYSTANE OP) Place 1 drop into both eyes 2 (two) times daily as needed (dry eyes).    [provider]  polyethylene glycol (MIRALAX / GLYCOLAX) 17 g packet Take 17 g by mouth 2 (two) times daily. 12/04/22   Erin Anger, PA-C  rosuvastatin (CRESTOR) 10 MG tablet Take 10 mg by mouth every Monday.    [provider]  tiZANidine (ZANAFLEX) 4 MG tablet Take 1 tablet (4 mg total) by mouth every 8 (eight) hours as needed for muscle spasms. 12/04/22   Erin Anger, PA-C      Allergies    Latex, Nsaids, Wellbutrin [bupropion hcl], Aspirin, Darvocet [propoxyphene n-acetaminophen], Tape, Toradol [ketorolac tromethamine], Tylenol [acetaminophen], Butalbital-apap-caffeine, Ciprofloxacin, Eszopiclone, Other, and Topiramate    Review of Systems   Review of Systems  Physical Exam Updated Vital Signs BP (!) 143/97 (BP Location: Right Arm)   Pulse 85   Temp 98.1 F (36.7 C) (Oral)   Resp 18   Ht 5\' 4"  (1.626 m)   Wt 103.9 kg   SpO2 97%   BMI 39.31 kg/m  Physical Exam Constitutional:      General: She is not in acute distress.    Appearance: She is obese.  HENT:     Head: Normocephalic and atraumatic.  Eyes:  Conjunctiva/sclera: Conjunctivae normal.     Pupils: Pupils are equal, round, and reactive to light.  Cardiovascular:     Rate and Rhythm: Regular rhythm. Tachycardia present.  Pulmonary:     Effort: Pulmonary effort is normal. No respiratory distress.     Comments: RR 25 bpm, 93% on room air Abdominal:     General: There is no distension.     Tenderness: There is no abdominal tenderness.  Musculoskeletal:     Comments: Tenderness with range of motion testing of the left lower extremity at the hip  Skin:    General: Skin is warm and dry.  Neurological:     General: No focal deficit present.     Mental Status: She is alert. Mental status is at baseline.  Psychiatric:        Mood and Affect: Mood normal.         Behavior: Behavior normal.     ED Results / Procedures / Treatments   Labs (all labs ordered are listed, but only abnormal results are displayed) Labs Reviewed  CBC WITH DIFFERENTIAL/PLATELET - Abnormal; Notable for the following components:      Result Value   RBC 5.45 (*)    MCH 25.0 (*)    RDW 17.0 (*)    Neutro Abs 1.3 (*)    All other components within normal limits  SARS CORONAVIRUS 2 BY RT PCR  BASIC METABOLIC PANEL  BRAIN NATRIURETIC PEPTIDE  TROPONIN I (HIGH SENSITIVITY)    EKG EKG Interpretation Date/Time:  Friday May 16 2023 14:28:53 EDT Ventricular Rate:  100 PR Interval:  173 QRS Duration:  77 QT Interval:  329 QTC Calculation: 425 R Axis:   28  Text Interpretation: Sinus tachycardia Ventricular premature complex Low voltage, precordial leads Confirmed by Alvester Chou [phone]) on 05/16/2023 3:20:21 PM  Radiology CT Angio Chest PE W and/or Wo Contrast  Result Date: 05/16/2023 CLINICAL DATA:  Shortness of breath diarrhea left hip discomfort palpitations EXAM: CT ANGIOGRAPHY CHEST WITH CONTRAST TECHNIQUE: Multidetector CT imaging of the chest was performed using the standard protocol during bolus administration of intravenous contrast. Multiplanar CT image reconstructions and MIPs were obtained to evaluate the vascular anatomy. RADIATION DOSE REDUCTION: This exam was performed according to the departmental dose-optimization program which includes automated exposure control, adjustment of the mA and/or kV according to patient size and/or use of iterative reconstruction technique. CONTRAST:  75mL OMNIPAQUE IOHEXOL 350 MG/ML SOLN COMPARISON:  Chest x-ray 05/16/2023, CT chest 02/06/2013 FINDINGS: Cardiovascular: Satisfactory opacification of the pulmonary arteries to the segmental level. No evidence of pulmonary embolism. Normal heart size. No pericardial effusion. Nonaneurysmal aorta. Mediastinum/Nodes: No enlarged mediastinal, hilar, or axillary lymph nodes. Thyroid  gland, trachea, and esophagus demonstrate no significant findings. Lungs/Pleura: Lungs are clear. No pleural effusion or pneumothorax. Upper Abdomen: No acute finding. Similar intra hepatic biliary dilatation and dilatation of common bile duct measuring 15 mm. Diffuse pancreatic ductal dilatation. Musculoskeletal: No chest wall abnormality. No acute or significant osseous findings. Review of the MIP images confirms the above findings. IMPRESSION: 1. Negative for acute pulmonary embolus or aortic dissection. 2. No acute airspace disease. 3. Similar intra and extrahepatic biliary dilatation and pancreatic ductal dilatation. Electronically Signed   By: Jasmine Pang M.D.   On: 05/16/2023 21:00   DG Hip Unilat With Pelvis 2-3 Views Left  Result Date: 05/16/2023 CLINICAL DATA:  Trauma, fall EXAM: DG HIP (WITH OR WITHOUT PELVIS) 2-3V LEFT COMPARISON:  03/30/2023 FINDINGS: There is previous left hip arthroplasty.  No fracture or dislocation is seen. Degenerative changes are noted in the visualized lower lumbar spine. IMPRESSION: Previous left hip arthroplasty. No recent fracture or dislocation is seen. Lumbar spondylosis. Electronically Signed   By: Ernie Avena M.D.   On: 05/16/2023 15:19   DG Chest 2 View  Result Date: 05/16/2023 CLINICAL DATA:  Shortness of breath, palpitations EXAM: CHEST - 2 VIEW COMPARISON:  03/14/2022 FINDINGS: Cardiac size is within normal limits. There are no signs of pulmonary edema or focal pulmonary consolidation. There is no pleural effusion or pneumothorax. Degenerative changes are noted in both shoulders. IMPRESSION: No active cardiopulmonary disease. Electronically Signed   By: Ernie Avena M.D.   On: 05/16/2023 15:18    Procedures Procedures    Medications Ordered in ED Medications  HYDROmorphone (DILAUDID) injection 1 mg (1 mg Intravenous Given 05/16/23 1753)  ondansetron (ZOFRAN) injection 4 mg (4 mg Intravenous Given 05/16/23 1758)  HYDROmorphone (DILAUDID)  injection 1 mg (1 mg Intravenous Given 05/16/23 2020)  iohexol (OMNIPAQUE) 350 MG/ML injection 75 mL (75 mLs Intravenous Contrast Given 05/16/23 2039)    ED Course/ Medical Decision Making/ A&P Clinical Course as of 05/16/23 2305  Fri May 16, 2023  2125 Patient reassessed and has remained stable with her vital signs for her entire course of the ED.  No emergent findings on her workup.  She now tells me that she suffers from severe anxiety and wonders whether her shortness of breath may be anxiety or pain response.  I believe this is a possibility.  I do not see any other indication at this time for hospitalization.  She is stable for discharge. [MT]    Clinical Course User Index [MT] Josy Peaden, Kermit Balo, MD                             Medical Decision Making Amount and/or Complexity of Data Reviewed Labs: ordered. Radiology: ordered.  Risk Prescription drug management.   This patient presents to the ED with concern for left hip pain, radiculopathy, and SOB. This involves an extensive number of treatment options, and is a complaint that carries with it a high risk of complications and morbidity.  The differential diagnosis includes left hip pain is likely related to lumbar radiculopathy as it radiates into her left leg.    Shortness of breath could be multifactorial, with differential would include pneumonia versus pulmonary embolism with pleural effusion versus viral URI versus anemia versus other  Co-morbidities that complicate the patient evaluation: History of DVT in past, not on A/C, higher risk for thromboembolic recurrence  I ordered and personally interpreted labs.  The pertinent results include: No emergent findings  I ordered imaging studies including dg chest, xray left hip, CT PE I independently visualized and interpreted imaging which showed no emergent findings I agree with the radiologist interpretation  The patient was maintained on a cardiac monitor.  I personally  viewed and interpreted the cardiac monitored which showed an underlying rhythm of: Sinus rhythm  Per my interpretation the patient's ECG shows sinus tachycardia with no acute ischemic findings  I ordered medication including the pain medication for chronic pain and radiculopathy, Zofran for nausea  I have reviewed the patients home medicines and have made adjustments as needed  Test Considered: Low suspicion for sepsis or acute intra-abdominal process  After the interventions noted above, I reevaluated the patient and found that they have: improved   Dispostion:  After consideration of the  diagnostic results and the patients response to treatment, I feel that the patent would benefit from outpatient follow-up         Final Clinical Impression(s) / ED Diagnoses Final diagnoses:  Left hip pain  Radiculopathy, unspecified spinal region  Shortness of breath    Rx / DC Orders ED Discharge Orders     None         Terald Sleeper, MD 05/16/23 2305

## 2023-08-09 ENCOUNTER — Emergency Department (HOSPITAL_BASED_OUTPATIENT_CLINIC_OR_DEPARTMENT_OTHER)
Admission: EM | Admit: 2023-08-09 | Discharge: 2023-08-09 | Disposition: A | Discharge status: Home / Self Care | Payer: Medicare HMO | Attending: Emergency Medicine | Admitting: Emergency Medicine

## 2023-08-09 ENCOUNTER — Other Ambulatory Visit: Payer: Self-pay

## 2023-08-09 ENCOUNTER — Encounter (HOSPITAL_BASED_OUTPATIENT_CLINIC_OR_DEPARTMENT_OTHER): Payer: Self-pay

## 2023-08-09 ENCOUNTER — Emergency Department (HOSPITAL_BASED_OUTPATIENT_CLINIC_OR_DEPARTMENT_OTHER): Payer: Medicare HMO

## 2023-08-09 DIAGNOSIS — E876 Hypokalemia: Secondary | ICD-10-CM | POA: Insufficient documentation

## 2023-08-09 DIAGNOSIS — R1084 Generalized abdominal pain: Secondary | ICD-10-CM | POA: Diagnosis not present

## 2023-08-09 DIAGNOSIS — R109 Unspecified abdominal pain: Secondary | ICD-10-CM | POA: Diagnosis present

## 2023-08-09 LAB — COMPREHENSIVE METABOLIC PANEL
ALT: 37 U/L (ref 0–44)
AST: 18 U/L (ref 15–41)
Albumin: 3.8 g/dL (ref 3.5–5.0)
Alkaline Phosphatase: 106 U/L (ref 38–126)
Anion gap: 13 (ref 5–15)
BUN: 12 mg/dL (ref 8–23)
CO2: 20 mmol/L — ABNORMAL LOW (ref 22–32)
Calcium: 8.9 mg/dL (ref 8.9–10.3)
Chloride: 102 mmol/L (ref 98–111)
Creatinine, Ser: 0.79 mg/dL (ref 0.44–1.00)
GFR, Estimated: >60 mL/min (ref >60)
Glucose, Bld: 114 mg/dL — ABNORMAL HIGH (ref 70–99)
Potassium: 3.2 mmol/L — ABNORMAL LOW (ref 3.5–5.1)
Sodium: 135 mmol/L (ref 135–145)
Total Bilirubin: 0.9 mg/dL (ref 0.3–1.2)
Total Protein: 8.2 g/dL — ABNORMAL HIGH (ref 6.5–8.1)

## 2023-08-09 LAB — URINALYSIS, ROUTINE W REFLEX MICROSCOPIC
Glucose, UA: NEGATIVE mg/dL
Hgb urine dipstick: NEGATIVE
Ketones, ur: 40 mg/dL — AB
Leukocytes,Ua: NEGATIVE
Nitrite: NEGATIVE
Protein, ur: 30 mg/dL — AB
Specific Gravity, Urine: >=1.03 (ref 1.005–1.030)
pH: 6 (ref 5.0–8.0)

## 2023-08-09 LAB — CBC
HCT: 43.7 % (ref 36.0–46.0)
Hemoglobin: 13.9 g/dL (ref 12.0–15.0)
MCH: 26.2 pg (ref 26.0–34.0)
MCHC: 31.8 g/dL (ref 30.0–36.0)
MCV: 82.5 fL (ref 80.0–100.0)
Platelets: 333 10*3/uL (ref 150–400)
RBC: 5.3 MIL/uL — ABNORMAL HIGH (ref 3.87–5.11)
RDW: 15.3 % (ref 11.5–15.5)
WBC: 5.8 10*3/uL (ref 4.0–10.5)
nRBC: 0 % (ref 0.0–0.2)

## 2023-08-09 LAB — URINALYSIS, MICROSCOPIC (REFLEX)

## 2023-08-09 LAB — LIPASE, BLOOD: Lipase: 25 U/L (ref 11–51)

## 2023-08-09 MED ORDER — FENTANYL CITRATE PF 50 MCG/ML IJ SOSY
50.0000 ug | PREFILLED_SYRINGE | INTRAMUSCULAR | Status: DC | PRN
Start: 2023-08-09 — End: 2023-08-10
  Administered 2023-08-09 (×2): 50 ug via INTRAVENOUS
  Filled 2023-08-09 (×2): qty 1

## 2023-08-09 MED ORDER — POTASSIUM CHLORIDE CRYS ER 20 MEQ PO TBCR
40.0000 meq | EXTENDED_RELEASE_TABLET | Freq: Once | ORAL | Status: AC
  Administered 2023-08-09: 40 meq via ORAL
  Filled 2023-08-09: qty 2

## 2023-08-09 MED ORDER — METRONIDAZOLE 500 MG PO TABS: 500.0000 mg | ORAL_TABLET | Freq: Two times a day (BID) | ORAL | 0 refills | Status: AC

## 2023-08-09 MED ORDER — LACTATED RINGERS IV BOLUS
1000.0000 mL | Freq: Once | INTRAVENOUS | Status: AC
  Administered 2023-08-09: 1000 mL via INTRAVENOUS

## 2023-08-09 MED ORDER — ONDANSETRON HCL 4 MG PO TABS: 4.0000 mg | ORAL_TABLET | Freq: Four times a day (QID) | ORAL | 0 refills | Status: DC

## 2023-08-09 MED ORDER — IOHEXOL 300 MG/ML  SOLN
100.0000 mL | Freq: Once | INTRAMUSCULAR | Status: AC | PRN
  Administered 2023-08-09: 100 mL via INTRAVENOUS

## 2023-08-09 MED ORDER — ONDANSETRON HCL 4 MG/2ML IJ SOLN
4.0000 mg | Freq: Once | INTRAMUSCULAR | Status: AC
  Administered 2023-08-09: 4 mg via INTRAVENOUS
  Filled 2023-08-09: qty 2

## 2023-08-09 MED ORDER — MORPHINE SULFATE (PF) 4 MG/ML IV SOLN
6.0000 mg | Freq: Once | INTRAVENOUS | Status: AC
  Administered 2023-08-09: 6 mg via INTRAVENOUS
  Filled 2023-08-09: qty 2

## 2023-08-09 MED ORDER — OXYCODONE HCL 5 MG PO TABS: 5.0000 mg | ORAL_TABLET | ORAL | 0 refills | Status: DC | PRN

## 2023-08-09 MED ORDER — METRONIDAZOLE 500 MG PO TABS
500.0000 mg | ORAL_TABLET | Freq: Once | ORAL | Status: AC
  Administered 2023-08-09: 500 mg via ORAL
  Filled 2023-08-09: qty 1

## 2023-08-09 NOTE — ED Notes (Signed)
Pt assisted to bathroom

## 2023-08-09 NOTE — ED Triage Notes (Signed)
The patient having ABD pain for three days. No fever. No V/D. Hx of colitis.

## 2023-08-09 NOTE — ED Provider Notes (Signed)
[place] [facility] Provider Note   CSN: [id number] Arrival date & time: 08/09/23  1715     History Chief Complaint  Patient presents with   Abdominal Pain    HPI Erin Wolf is a 67 y.o. female presenting for acute on chronic abdominal pain.  67 year old female.  History of diverticulitis.  States that this feels very similar to prior flares.  Diarrhea x 4 days temperature of 102 at that time.  Nausea vomiting worsening today spouse finally insisted that patient come in for further care and management.  Poor p.o. tolerance today due to symptoms..   Patient's recorded medical, surgical, social, medication list and allergies were reviewed in the Snapshot window as part of the initial history.   Review of Systems   Review of Systems  Constitutional:  Positive for chills and fever.  HENT:  Negative for ear pain and sore throat.   Eyes:  Negative for pain and visual disturbance.  Respiratory:  Negative for cough and shortness of breath.   Cardiovascular:  Negative for chest pain and palpitations.  Gastrointestinal:  Positive for abdominal pain, nausea and vomiting.  Genitourinary:  Negative for dysuria and hematuria.  Musculoskeletal:  Negative for arthralgias and back pain.  Skin:  Negative for color change and rash.  Neurological:  Negative for seizures and syncope.  All other systems reviewed and are negative.   Physical Exam Updated Vital Signs BP 135/88   Pulse 85   Temp 97.8 F (36.6 C) (Oral)   Resp 19   Ht 5\' 4"  (1.626 m)   Wt 103 kg   SpO2 100%   BMI 38.96 kg/m  Physical Exam Constitutional:      General: She is not in acute distress.    Appearance: She is not ill-appearing or toxic-appearing.  HENT:     Head: Normocephalic and atraumatic.  Eyes:     Extraocular Movements: Extraocular movements intact.     Pupils: Pupils are equal, round, and reactive to light.  Cardiovascular:     Rate and Rhythm: Normal rate.   Pulmonary:     Effort: No respiratory distress.  Abdominal:     General: Abdomen is flat.     Tenderness: There is abdominal tenderness in the epigastric area and periumbilical area.  Musculoskeletal:        General: No swelling, deformity or signs of injury.     Cervical back: Normal range of motion. No rigidity.  Skin:    General: Skin is warm and dry.  Neurological:     General: No focal deficit present.     Mental Status: She is alert and oriented to person, place, and time.  Psychiatric:        Mood and Affect: Mood normal.      ED Course/ Medical Decision Making/ A&P    Procedures Procedures   Medications Ordered in ED Medications  fentaNYL (SUBLIMAZE) injection 50 mcg (50 mcg Intravenous Given 08/09/23 2157)  potassium chloride SA (KLOR-CON M) CR tablet 40 mEq (has no administration in time range)  morphine (PF) 4 MG/ML injection 6 mg (6 mg Intravenous Given 08/09/23 1841)  ondansetron (ZOFRAN) injection 4 mg (4 mg Intravenous Given 08/09/23 1837)  lactated ringers bolus 1,000 mL (0 mLs Intravenous Stopped 08/09/23 1942)  iohexol (OMNIPAQUE) 300 MG/ML solution 100 mL (100 mLs Intravenous Contrast Given 08/09/23 1951)  metroNIDAZOLE (FLAGYL) tablet 500 mg (500 mg Oral Given 08/09/23 2204)   Medical Decision Making:   Burgess Estelle  R Bering is a 67 y.o. female who presented to the ED today with abdominal pain, detailed above.    Additional history discussed with patient's family/caregivers.  Patient placed on continuous vitals and telemetry monitoring while in ED which was reviewed periodically.  Complete initial physical exam performed, notably the patient  was HDS in NAD.     Reviewed and confirmed nursing documentation for past medical history, family history, social history.    Initial Assessment:   With the patient's presentation of abdominal pain, most likely diagnosis is nonspecific etiology. Other diagnoses were considered including (but not limited to)  gastroenteritis, colitis, small bowel obstruction, appendicitis, cholecystitis, pancreatitis, nephrolithiasis, UTI, pyleonephritis. These are considered less likely due to history of present illness and physical exam findings.   This is most consistent with an acute life/limb threatening illness complicated by underlying chronic conditions.   Initial Plan:  CBC/CMP to evaluate for underlying infectious/metabolic etiology for patient's abdominal pain  Lipase to evaluate for pancreatitis  EKG to evaluate for cardiac source of pain  CTAB/Pelvis with contrast to evaluate for structural/surgical etiology of patients' severe abdominal pain.  Urinalysis and repeat physical assessment to evaluate for UTI/Pyelonpehritis  Empiric management of symptoms with escalating pain control and antiemetics as needed.   Initial Study Results:   Laboratory  All laboratory results reviewed without evidence of clinically relevant pathology.   Exceptions include: Mild hypokalemia   EKG EKG was reviewed independently. Rate, rhythm, axis, intervals all examined and without medically relevant abnormality. ST segments without concerns for elevations.    Radiology All images reviewed independently. Agree with radiology report at this time.   CT ABDOMEN PELVIS W CONTRAST  Result Date: 08/09/2023 CLINICAL DATA:  Acute nonlocalized abdominal pain. History of colitis. EXAM: CT ABDOMEN AND PELVIS WITH CONTRAST TECHNIQUE: Multidetector CT imaging of the abdomen and pelvis was performed using the standard protocol following bolus administration of intravenous contrast. RADIATION DOSE REDUCTION: This exam was performed according to the departmental dose-optimization program which includes automated exposure control, adjustment of the mA and/or kV according to patient size and/or use of iterative reconstruction technique. CONTRAST:  OMNIPAQUE IOHEXOL 300 MG/ML  SOLN COMPARISON:  02/02/2023 FINDINGS: Lower chest: Lung bases  are clear. Hepatobiliary: Gallbladder is not identified, likely surgically absent. There is prominent intra and extrahepatic bile duct dilatation. This is similar to prior study. No specific obstructing mass lesion or stone identified. Pancreas: Diffuse pancreatic ductal dilatation, unchanged since prior study. No specific obstructing mass lesion or stone is identified. No inflammatory changes or loculated collections. Spleen: Normal in size without focal abnormality. Adrenals/Urinary Tract: 1 cm diameter nodule in the left adrenal gland containing macroscopic fat consistent with myelolipoma. No change since prior study. No imaging follow-up is indicated. Kidneys in the bladder are normal. Stomach/Bowel: Stomach, small bowel, and colon are not abnormally distended. No wall thickening or inflammatory changes are appreciated. Diverticulosis of the sigmoid colon without evidence of acute diverticulitis. Decompression limits evaluation but there appears to be some diffuse wall thickening of the descending, sigmoid, and rectal portions of the colon. This suggests colitis, possibly infectious or inflammatory. Appendix is not identified. Vascular/Lymphatic: No significant vascular findings are present. No enlarged abdominal or pelvic lymph nodes. Reproductive: Status post hysterectomy. No adnexal masses. Other: No abdominal wall hernia or abnormality. No abdominopelvic ascites. Musculoskeletal: No acute or significant osseous findings. Degenerative changes in the lumbar spine. Previous left hip arthroplasty. IMPRESSION: 1. Persistent finding of intra and extrahepatic bile duct dilatation as well as  pancreatic ductal dilatation. This is unchanged since prior studies, likely chronic. No cause is identified. 2. Left adrenal mass measuring 1 cm, consistent with benign myelolipoma. No follow-up imaging is recommended. JACR 2017 Aug; 14(8):1038-44, JCAT 2016 Mar-Apr; 40(2):194-200, Urol J 2006 Spring; 3(2):71-4. 3. Diffuse wall  thickening of the descending and rectosigmoid colon suggesting colitis. This could represent ulcerative colitis or infectious colitis. No abscess. Electronically Signed   By: Burman Nieves M.D.   On: 08/09/2023 21:56     Final Reassessment and Plan:   Reassessed at bedside.  Symptoms improved with treatment here in the emergency room she is been able to tolerate p.o. intake and p.o. potassium supplementation. Will continue on Flagyl for likely infectious colitis given history of similar she has already arranged follow-up with her gastro in the outpatient setting which I strongly recommended she keep.  Given well appearance at this time she is stable for outpatient management.  Supportive care sent to her pharmacy as well as antibiotics.      Clinical Impression:  1. Generalized abdominal pain      Data Unavailable   Final Clinical Impression(s) / ED Diagnoses Final diagnoses:  Generalized abdominal pain    Rx / DC Orders ED Discharge Orders          Ordered    oxyCODONE (ROXICODONE) 5 MG immediate release tablet  Every 4 hours PRN        08/09/23 2239    ondansetron (ZOFRAN) 4 MG tablet  Every 6 hours        08/09/23 2239    metroNIDAZOLE (FLAGYL) 500 MG tablet  2 times daily        08/09/23 2239              Glyn Ade, MD 08/09/23 2239

## 2023-08-18 ENCOUNTER — Ambulatory Visit: Payer: Medicare HMO | Admitting: Neurology

## 2023-09-30 ENCOUNTER — Emergency Department (HOSPITAL_BASED_OUTPATIENT_CLINIC_OR_DEPARTMENT_OTHER): Payer: Medicare HMO

## 2023-09-30 ENCOUNTER — Emergency Department (HOSPITAL_BASED_OUTPATIENT_CLINIC_OR_DEPARTMENT_OTHER)
Admission: EM | Admit: 2023-09-30 | Discharge: 2023-09-30 | Disposition: A | Discharge status: Home / Self Care | Payer: Medicare HMO | Attending: Emergency Medicine | Admitting: Emergency Medicine

## 2023-09-30 ENCOUNTER — Encounter (HOSPITAL_BASED_OUTPATIENT_CLINIC_OR_DEPARTMENT_OTHER): Payer: Self-pay | Admitting: Emergency Medicine

## 2023-09-30 ENCOUNTER — Other Ambulatory Visit: Payer: Self-pay

## 2023-09-30 DIAGNOSIS — Z79899 Other long term (current) drug therapy: Secondary | ICD-10-CM | POA: Diagnosis not present

## 2023-09-30 DIAGNOSIS — E119 Type 2 diabetes mellitus without complications: Secondary | ICD-10-CM | POA: Insufficient documentation

## 2023-09-30 DIAGNOSIS — R1031 Right lower quadrant pain: Secondary | ICD-10-CM | POA: Diagnosis not present

## 2023-09-30 DIAGNOSIS — R519 Headache, unspecified: Secondary | ICD-10-CM | POA: Diagnosis present

## 2023-09-30 DIAGNOSIS — G44229 Chronic tension-type headache, not intractable: Secondary | ICD-10-CM | POA: Insufficient documentation

## 2023-09-30 DIAGNOSIS — Z91040 Latex allergy status: Secondary | ICD-10-CM | POA: Diagnosis not present

## 2023-09-30 DIAGNOSIS — I10 Essential (primary) hypertension: Secondary | ICD-10-CM | POA: Diagnosis not present

## 2023-09-30 MED ORDER — SODIUM CHLORIDE 0.9 % IV BOLUS
1000.0000 mL | Freq: Once | INTRAVENOUS | Status: AC
  Administered 2023-09-30: 1000 mL via INTRAVENOUS

## 2023-09-30 MED ORDER — MIDAZOLAM HCL 2 MG/2ML IJ SOLN
2.0000 mg | Freq: Once | INTRAMUSCULAR | Status: AC
  Administered 2023-09-30: 2 mg via INTRAVENOUS
  Filled 2023-09-30: qty 2

## 2023-09-30 MED ORDER — METOCLOPRAMIDE HCL 5 MG/ML IJ SOLN
10.0000 mg | Freq: Once | INTRAMUSCULAR | Status: AC
  Administered 2023-09-30: 10 mg via INTRAVENOUS
  Filled 2023-09-30: qty 2

## 2023-09-30 MED ORDER — DEXAMETHASONE SODIUM PHOSPHATE 10 MG/ML IJ SOLN
10.0000 mg | Freq: Once | INTRAMUSCULAR | Status: AC
  Administered 2023-09-30: 10 mg via INTRAVENOUS
  Filled 2023-09-30: qty 1

## 2023-09-30 MED ORDER — DIPHENHYDRAMINE HCL 50 MG/ML IJ SOLN
12.5000 mg | Freq: Once | INTRAMUSCULAR | Status: DC
  Filled 2023-09-30: qty 1

## 2023-09-30 MED ORDER — DEXAMETHASONE SODIUM PHOSPHATE 10 MG/ML IJ SOLN
10.0000 mg | Freq: Once | INTRAMUSCULAR | Status: DC
  Filled 2023-09-30: qty 1

## 2023-09-30 MED ORDER — MORPHINE SULFATE (PF) 2 MG/ML IV SOLN
2.0000 mg | Freq: Once | INTRAVENOUS | Status: AC
  Administered 2023-09-30: 2 mg via INTRAVENOUS
  Filled 2023-09-30: qty 1

## 2023-09-30 NOTE — ED Notes (Signed)
ED Provider at bedside. 

## 2023-09-30 NOTE — ED Notes (Addendum)
Pt requested to have IV fluids turned off. ED provider at bedside to discuss pain medication options.

## 2023-09-30 NOTE — Discharge Instructions (Addendum)
It was a pleasure taking care of you today.  You were evaluated in the emergency room for headache.  A CT scan was performed of your head which did not show any significant abnormality. You were given medication for nausea, anxiety and pain today.  As discussed please follow-up with your primary care doctor and ophthalmologist later this week.  A small supply of antibiotic nausea medication was sent into your pharmacy.  As discussed please use Tylenol as needed for pain.  If you experience any new or worsening symptoms including difficulty walking, persistent vomiting sudden worsening of your headache please return to the emergency room.

## 2023-09-30 NOTE — ED Triage Notes (Addendum)
Severe headache , blurry vision , NV all x 4 days  . Hx migraine .  Hx HTN , diabetes , lupus .  Alert and oriented x 4 . Ambulatory . Obvious distress.  Pressure to forehead and occipital area .

## 2023-09-30 NOTE — ED Notes (Signed)
Patient transported to CT 

## 2023-09-30 NOTE — ED Provider Notes (Signed)
[facility] Provider Note   CSN: [id number] Arrival date & time: 09/30/23  1048     History  Chief Complaint  Patient presents with   Headache    Erin Wolf is a 67 y.o. female with hypertension, diabetes, lupus presents with bilateral frontal/periorbital headache worse on left started about 3 to 4 days ago.  She states her symptoms came on gradually and have worsened in the past day.  She endorses photophobia and bilateral blurry vision.  Additionally endorses nausea and vomiting.  Does endorse some right lower abdominal discomfort which is typical for her.  No fevers or chills.  Reports history of migraines when she was younger and otherwise without headaches.  No fall or injury.  She is not on blood thinners.   Headache     Past Medical History:  Diagnosis Date   Anxiety    Arthritis    Chronic abdominal pain    Chronic lower back pain    Chronic nausea    Depression    Diabetes mellitus without complication (HCC)    not on meds   Diverticulosis    Dyspnea    with exertion   Frequency of urination    GERD (gastroesophageal reflux disease)    Headache    Hematuria    Hepatitis    pt denies   Hepatitis C antibody positive in blood    per pt told by pcp   History of panic attacks    History of syncope    hx recurrent syncope --- per epic domentation non-cardiac , orthostatic hypotension, anxiety, dehydration   History of uterine fibroid    HTN (hypertension), benign    Hyperlipidemia 01/03/2012   IBS (irritable bowel syndrome)    Myalgia    Sciatic nerve disease, left 2023   Seizure disorder (HCC) followed by pcp until new neurologist appr. in jan 2019   "started having them in my 20's" ,  petit mal ----  per pt last seizure one 2015   SLE (systemic lupus erythematosus) [facility])    rheumatologist-- dr Sharmon Revere (consult 11-12-2016) having work-up done   Ulcerative colitis    followed by dr Evette Cristal at [facility]    Wears glasses      Home Medications Prior to Admission medications   Medication Sig Start Date End Date Taking? Authorizing Provider  esomeprazole (NEXIUM) 40 MG capsule Take 40 mg by mouth daily as needed (acid reflux). 06/10/19   [provider]  FLUoxetine (PROZAC) 20 MG capsule Take 20 mg by mouth daily.     [provider]  furosemide (LASIX) 20 MG tablet Take 20 mg by mouth daily.    [provider]  HYDROmorphone (DILAUDID) 4 MG tablet Take 1 tablet (4 mg total) by mouth every 4 (four) hours as needed for severe pain. DO NOT take oxycodone while taking this medication. 12/04/22   Cassandria Anger, PA-C  lidocaine (HM LIDOCAINE PATCH) 4 % Place 1 patch onto the skin daily. 03/30/23   Jeanelle Malling, PA  Multiple Vitamins-Minerals (WOMENS MULTI PO) Take 1 tablet by mouth daily. Centrum    [provider]  naloxone [facility]) nasal spray 4 mg/0.1 mL Place 1 spray into the nose daily as needed (opioid overdose).    [provider]  ondansetron (ZOFRAN) 4 MG tablet Take 1 tablet (4 mg total) by mouth every 6 (six) hours. 08/09/23   Glyn Ade, MD  ondansetron (ZOFRAN-ODT) 4 MG disintegrating tablet Take  1 tablet (4 mg total) by mouth every 8 (eight) hours as needed. 03/30/23   Jeanelle Malling, PA  oxyCODONE (ROXICODONE) 5 MG immediate release tablet Take 1 tablet (5 mg total) by mouth every 4 (four) hours as needed for up to 12 doses for severe pain (pain score 7-10). 08/09/23   Glyn Ade, MD  Polyethyl Glycol-Propyl Glycol (SYSTANE OP) Place 1 drop into both eyes 2 (two) times daily as needed (dry eyes).    [provider]  polyethylene glycol (MIRALAX / GLYCOLAX) 17 g packet Take 17 g by mouth 2 (two) times daily. 12/04/22   Cassandria Anger, PA-C  rosuvastatin (CRESTOR) 10 MG tablet Take 10 mg by mouth every Monday.    [provider]  tiZANidine (ZANAFLEX) 4 MG tablet Take 1 tablet (4 mg total) by mouth every 8 (eight) hours as needed  for muscle spasms. 12/04/22   Cassandria Anger, PA-C      Allergies    Latex, Nsaids, Wellbutrin [bupropion hcl], Aspirin, Darvocet [propoxyphene n-acetaminophen], Tape, Toradol [ketorolac tromethamine], Tylenol [acetaminophen], Butalbital-apap-caffeine, Ciprofloxacin, Eszopiclone, Other, and Topiramate    Review of Systems   Review of Systems  Neurological:  Positive for headaches.    Physical Exam Updated Vital Signs BP 121/85   Pulse 87   Temp 97.9 F (36.6 C) (Oral)   Resp 18   Wt 103.4 kg   SpO2 100%   BMI 39.14 kg/m  Physical Exam Vitals and nursing note reviewed.  Constitutional:      General: She is not in acute distress.    Appearance: She is well-developed.  HENT:     Head: Normocephalic and atraumatic.  Eyes:     General: No visual field deficit or scleral icterus.    Extraocular Movements: Extraocular movements intact.     Right eye: Normal extraocular motion and no nystagmus.     Left eye: Normal extraocular motion and no nystagmus.     Conjunctiva/sclera: Conjunctivae normal.     Pupils: Pupils are equal, round, and reactive to light.  Cardiovascular:     Rate and Rhythm: Normal rate and regular rhythm.     Heart sounds: No murmur heard. Pulmonary:     Effort: Pulmonary effort is normal. No respiratory distress.     Breath sounds: Normal breath sounds.  Abdominal:     Palpations: Abdomen is soft.     Comments: Minimal RLQ tenderness, no rebound, guarding or peritoneal signs  Musculoskeletal:        General: No swelling.     Cervical back: Neck supple. No rigidity.  Skin:    General: Skin is warm and dry.     Capillary Refill: Capillary refill takes less than 2 seconds.  Neurological:     Mental Status: She is alert and oriented to person, place, and time.     Cranial Nerves: No cranial nerve deficit or facial asymmetry.     Sensory: No sensory deficit.  Psychiatric:        Mood and Affect: Mood normal.     ED Results / Procedures / Treatments    Labs (all labs ordered are listed, but only abnormal results are displayed) Labs Reviewed - No data to display  EKG EKG Interpretation Date/Time:  Tuesday September 30 2023 11:06:56 EST Ventricular Rate:  92 PR Interval:  179 QRS Duration:  77 QT Interval:  334 QTC Calculation: 414 R Axis:   54  Text Interpretation: Sinus rhythm Confirmed by Ernie Avena ([id number]) on 09/30/2023 11:09:20  AM  Radiology CT HEAD WO CONTRAST ( )  Result Date: 09/30/2023 CLINICAL DATA:  New onset headache and blurred vision EXAM: CT HEAD WITHOUT CONTRAST TECHNIQUE: Contiguous axial images were obtained from the base of the skull through the vertex without intravenous contrast. RADIATION DOSE REDUCTION: This exam was performed according to the departmental dose-optimization program which includes automated exposure control, adjustment of the mA and/or kV according to patient size and/or use of iterative reconstruction technique. COMPARISON:  MRI 11/12/2021 FINDINGS: Brain: The brain shows a normal appearance without evidence of malformation, atrophy, old or acute small or large vessel infarction, mass lesion, hemorrhage, hydrocephalus or extra-axial collection. Vascular: There is atherosclerotic calcification of the major vessels at the base of the brain. Skull: Normal.  No traumatic finding.  No focal bone lesion. Sinuses/Orbits: Sinuses are clear. Orbits appear normal. Mastoids are clear. Other: None significant IMPRESSION: No acute CT finding. Normal appearance of the brain itself. Atherosclerotic calcification of the major vessels at the base of the brain. Electronically Signed   By: Paulina Fusi M.D.   On: 09/30/2023 13:45    Procedures Procedures    Medications Ordered in ED Medications  diphenhydrAMINE (BENADRYL) injection 12.5 mg (12.5 mg Intravenous Not Given 09/30/23 1255)  sodium chloride 0.9 % bolus 1,000 mL (0 mLs Intravenous Stopped 09/30/23 1303)  metoCLOPramide (REGLAN) injection 10 mg (10 mg  Intravenous Given 09/30/23 1244)  midazolam (VERSED) injection 2 mg (2 mg Intravenous Given 09/30/23 1312)  morphine (PF) 2 MG/ML injection 2 mg (2 mg Intravenous Given 09/30/23 1415)  dexamethasone (DECADRON) injection 10 mg (10 mg Intravenous Given 09/30/23 1416)    ED Course/ Medical Decision Making/ A&P                                 Medical Decision Making  This patient presents to the ED with chief complaint(s) of headache.  The complaint involves an extensive differential diagnosis and also carries with it a high risk of complications and morbidity.   pertinent past medical history as listed in HPI  The differential diagnosis includes  Migraine, tension headache, meningitis, CVA intracranial hemorrhage, mass effect The initial plan is to  Will start with GI cocktail and CT head Additional history obtained: No additional historians Records reviewed previous admission documents and Care Everywhere/External Records  Initial Assessment:   Patient is hemodynamically stable.  Afebrile.  No neurodeficits on exam.  No meningeal signs.  New onset headache greater than 50 with cardiovascular risk factors including diabetes and hypertension.  Nausea and vomiting with a nonfocal abdominal exam.    Independent ECG interpretation:  Sinus rhythm without ischemic changes  Independent labs interpretation:  The following labs were independently interpreted:  Recent lab work from October 2024 without mild  Independent visualization and interpretation of imaging: I independently visualized the following imaging with scope of interpretation limited to determining acute life threatening conditions related to emergency care: CT head, which revealed no acute abnormality or mass  Treatment and Reassessment: Following first assessment patient given GI cocktail consisting of Reglan, Benadryl, Decadron and liter normal saline  1 PM patient is tearful denying Benadryl, Decadron and fluids.  States  she does not want a migraine cocktail because she does not have a migraine.  She states she has not slept in days and is also suffering from left chronic hip pain.  States she just wants something for pain and anxiety.  Will start with  a small dose of Versed and reevaluate.  2:10 PM patient reports decreased anxiety since IV Versed, continues to complain of some left hip pain and headache.  Would like to try something for pain and part of migraine cocktail.  Denies interest and IV fluids.  And then she states she would like to go home.  Will give very small dose of morphine and Decadron and discharged home with close PCP and ophthalmology follow-up.  Consultations obtained:   none  Disposition:   Patient will be discharged home with close follow-up with her primary care provider additionally intends to follow-up with her ophthalmologist.  Small quantity Reglan sent into her pharmacy  Social Determinants of Health:   None  This note was dictated with voice recognition software.  Despite best efforts at proofreading, errors may have occurred which can change the documentation meaning.          Final Clinical Impression(s) / ED Diagnoses Final diagnoses:  Chronic tension-type headache, not intractable    Rx / DC Orders ED Discharge Orders     None         Halford Decamp, PA-C 09/30/23 1515    Ernie Avena, MD 10/02/23 1918

## 2023-11-19 ENCOUNTER — Emergency Department (HOSPITAL_COMMUNITY): Payer: Medicare HMO

## 2023-11-19 ENCOUNTER — Other Ambulatory Visit: Payer: Self-pay

## 2023-11-19 ENCOUNTER — Inpatient Hospital Stay (HOSPITAL_COMMUNITY)
Admission: EM | Admit: 2023-11-19 | Discharge: 2023-11-21 | DRG: 389 | Disposition: A | Discharge status: Home / Self Care | Payer: Medicare HMO | Attending: Internal Medicine | Admitting: Internal Medicine

## 2023-11-19 DIAGNOSIS — Z886 Allergy status to analgesic agent status: Secondary | ICD-10-CM

## 2023-11-19 DIAGNOSIS — Z79899 Other long term (current) drug therapy: Secondary | ICD-10-CM

## 2023-11-19 DIAGNOSIS — Z8249 Family history of ischemic heart disease and other diseases of the circulatory system: Secondary | ICD-10-CM

## 2023-11-19 DIAGNOSIS — Z820 Family history of epilepsy and other diseases of the nervous system: Secondary | ICD-10-CM

## 2023-11-19 DIAGNOSIS — E119 Type 2 diabetes mellitus without complications: Secondary | ICD-10-CM | POA: Diagnosis present

## 2023-11-19 DIAGNOSIS — M329 Systemic lupus erythematosus, unspecified: Secondary | ICD-10-CM | POA: Diagnosis present

## 2023-11-19 DIAGNOSIS — I10 Essential (primary) hypertension: Secondary | ICD-10-CM | POA: Diagnosis present

## 2023-11-19 DIAGNOSIS — G40909 Epilepsy, unspecified, not intractable, without status epilepticus: Secondary | ICD-10-CM | POA: Diagnosis present

## 2023-11-19 DIAGNOSIS — K5790 Diverticulosis of intestine, part unspecified, without perforation or abscess without bleeding: Secondary | ICD-10-CM | POA: Diagnosis present

## 2023-11-19 DIAGNOSIS — R109 Unspecified abdominal pain: Secondary | ICD-10-CM | POA: Diagnosis not present

## 2023-11-19 DIAGNOSIS — Z880 Allergy status to penicillin: Secondary | ICD-10-CM

## 2023-11-19 DIAGNOSIS — Z881 Allergy status to other antibiotic agents status: Secondary | ICD-10-CM | POA: Diagnosis not present

## 2023-11-19 DIAGNOSIS — K51019 Ulcerative (chronic) pancolitis with unspecified complications: Secondary | ICD-10-CM

## 2023-11-19 DIAGNOSIS — Z9079 Acquired absence of other genital organ(s): Secondary | ICD-10-CM

## 2023-11-19 DIAGNOSIS — Z96653 Presence of artificial knee joint, bilateral: Secondary | ICD-10-CM | POA: Diagnosis present

## 2023-11-19 DIAGNOSIS — Z803 Family history of malignant neoplasm of breast: Secondary | ICD-10-CM | POA: Diagnosis not present

## 2023-11-19 DIAGNOSIS — Z888 Allergy status to other drugs, medicaments and biological substances status: Secondary | ICD-10-CM | POA: Diagnosis not present

## 2023-11-19 DIAGNOSIS — Z9049 Acquired absence of other specified parts of digestive tract: Secondary | ICD-10-CM

## 2023-11-19 DIAGNOSIS — Z91040 Latex allergy status: Secondary | ICD-10-CM

## 2023-11-19 DIAGNOSIS — Z90722 Acquired absence of ovaries, bilateral: Secondary | ICD-10-CM | POA: Diagnosis not present

## 2023-11-19 DIAGNOSIS — Z96643 Presence of artificial hip joint, bilateral: Secondary | ICD-10-CM | POA: Diagnosis present

## 2023-11-19 DIAGNOSIS — K51911 Ulcerative colitis, unspecified with rectal bleeding: Secondary | ICD-10-CM | POA: Diagnosis present

## 2023-11-19 DIAGNOSIS — R1032 Left lower quadrant pain: Secondary | ICD-10-CM | POA: Diagnosis present

## 2023-11-19 DIAGNOSIS — K567 Ileus, unspecified: Secondary | ICD-10-CM | POA: Diagnosis present

## 2023-11-19 DIAGNOSIS — Z800 Family history of malignant neoplasm of digestive organs: Secondary | ICD-10-CM | POA: Diagnosis not present

## 2023-11-19 DIAGNOSIS — Z90710 Acquired absence of both cervix and uterus: Secondary | ICD-10-CM | POA: Diagnosis not present

## 2023-11-19 DIAGNOSIS — R1084 Generalized abdominal pain: Secondary | ICD-10-CM | POA: Diagnosis not present

## 2023-11-19 DIAGNOSIS — E876 Hypokalemia: Secondary | ICD-10-CM | POA: Diagnosis present

## 2023-11-19 DIAGNOSIS — E785 Hyperlipidemia, unspecified: Secondary | ICD-10-CM | POA: Diagnosis present

## 2023-11-19 DIAGNOSIS — K5190 Ulcerative colitis, unspecified, without complications: Secondary | ICD-10-CM | POA: Diagnosis present

## 2023-11-19 LAB — URINALYSIS, ROUTINE W REFLEX MICROSCOPIC
Bilirubin Urine: NEGATIVE
Glucose, UA: NEGATIVE mg/dL
Hgb urine dipstick: NEGATIVE
Ketones, ur: NEGATIVE mg/dL
Leukocytes,Ua: NEGATIVE
Nitrite: NEGATIVE
Protein, ur: 30 mg/dL — AB
Specific Gravity, Urine: 1.028 (ref 1.005–1.030)
pH: 5 (ref 5.0–8.0)

## 2023-11-19 LAB — COMPREHENSIVE METABOLIC PANEL
ALT: 64 U/L — ABNORMAL HIGH (ref 0–44)
AST: 22 U/L (ref 15–41)
Albumin: 3.8 g/dL (ref 3.5–5.0)
Alkaline Phosphatase: 71 U/L (ref 38–126)
Anion gap: 10 (ref 5–15)
BUN: 12 mg/dL (ref 8–23)
CO2: 19 mmol/L — ABNORMAL LOW (ref 22–32)
Calcium: 9.3 mg/dL (ref 8.9–10.3)
Chloride: 107 mmol/L (ref 98–111)
Creatinine, Ser: 0.61 mg/dL (ref 0.44–1.00)
GFR, Estimated: >60 mL/min (ref >60)
Glucose, Bld: 106 mg/dL — ABNORMAL HIGH (ref 70–99)
Potassium: 3.3 mmol/L — ABNORMAL LOW (ref 3.5–5.1)
Sodium: 136 mmol/L (ref 135–145)
Total Bilirubin: 0.4 mg/dL (ref 0.0–1.2)
Total Protein: 8 g/dL (ref 6.5–8.1)

## 2023-11-19 LAB — CBC WITH DIFFERENTIAL/PLATELET
Abs Immature Granulocytes: 0.02 10*3/uL (ref 0.00–0.07)
Basophils Absolute: 0 10*3/uL (ref 0.0–0.1)
Basophils Relative: 0 %
Eosinophils Absolute: 0 10*3/uL (ref 0.0–0.5)
Eosinophils Relative: 0 %
HCT: 44.6 % (ref 36.0–46.0)
Hemoglobin: 13.8 g/dL (ref 12.0–15.0)
Immature Granulocytes: 0 %
Lymphocytes Relative: 49 %
Lymphs Abs: 3.1 10*3/uL (ref 0.7–4.0)
MCH: 26.3 pg (ref 26.0–34.0)
MCHC: 30.9 g/dL (ref 30.0–36.0)
MCV: 85.1 fL (ref 80.0–100.0)
Monocytes Absolute: 0.6 10*3/uL (ref 0.1–1.0)
Monocytes Relative: 9 %
Neutro Abs: 2.7 10*3/uL (ref 1.7–7.7)
Neutrophils Relative %: 42 %
Platelets: 370 10*3/uL (ref 150–400)
RBC: 5.24 MIL/uL — ABNORMAL HIGH (ref 3.87–5.11)
RDW: 15 % (ref 11.5–15.5)
WBC: 6.4 10*3/uL (ref 4.0–10.5)
nRBC: 0 % (ref 0.0–0.2)

## 2023-11-19 LAB — LIPASE, BLOOD: Lipase: 32 U/L (ref 11–51)

## 2023-11-19 MED ORDER — HYDROMORPHONE HCL 2 MG PO TABS
4.0000 mg | ORAL_TABLET | ORAL | Status: DC | PRN
  Administered 2023-11-19 – 2023-11-20 (×2): 4 mg via ORAL
  Filled 2023-11-19 (×2): qty 2

## 2023-11-19 MED ORDER — ONDANSETRON HCL 4 MG/2ML IJ SOLN
4.0000 mg | Freq: Once | INTRAMUSCULAR | Status: AC
  Administered 2023-11-19: 4 mg via INTRAVENOUS
  Filled 2023-11-19: qty 2

## 2023-11-19 MED ORDER — OXYCODONE HCL 5 MG PO TABS
5.0000 mg | ORAL_TABLET | ORAL | Status: DC | PRN
  Administered 2023-11-20: 5 mg via ORAL
  Filled 2023-11-19: qty 1

## 2023-11-19 MED ORDER — ROSUVASTATIN CALCIUM 10 MG PO TABS: 10.0000 mg | ORAL_TABLET | ORAL | Status: DC

## 2023-11-19 MED ORDER — FENTANYL CITRATE PF 50 MCG/ML IJ SOSY
50.0000 ug | PREFILLED_SYRINGE | Freq: Once | INTRAMUSCULAR | Status: AC
Start: 2023-11-19 — End: 2023-11-19
  Administered 2023-11-19: 50 ug via INTRAVENOUS
  Filled 2023-11-19: qty 1

## 2023-11-19 MED ORDER — IOHEXOL 300 MG/ML  SOLN
100.0000 mL | Freq: Once | INTRAMUSCULAR | Status: AC | PRN
  Administered 2023-11-19: 100 mL via INTRAVENOUS

## 2023-11-19 MED ORDER — ONDANSETRON HCL 4 MG PO TABS: 4.0000 mg | ORAL_TABLET | Freq: Four times a day (QID) | ORAL | Status: DC | PRN

## 2023-11-19 MED ORDER — ONDANSETRON HCL 4 MG/2ML IJ SOLN
4.0000 mg | Freq: Four times a day (QID) | INTRAMUSCULAR | Status: DC | PRN
  Administered 2023-11-19: 4 mg via INTRAVENOUS
  Filled 2023-11-19: qty 2

## 2023-11-19 MED ORDER — PANTOPRAZOLE SODIUM 40 MG PO TBEC
40.0000 mg | DELAYED_RELEASE_TABLET | Freq: Every day | ORAL | Status: DC
  Administered 2023-11-19 – 2023-11-20 (×2): 40 mg via ORAL
  Filled 2023-11-19 (×2): qty 1

## 2023-11-19 MED ORDER — POTASSIUM CHLORIDE IN NACL 40-0.9 MEQ/L-% IV SOLN
INTRAVENOUS | Status: DC
  Filled 2023-11-19 (×2): qty 1000

## 2023-11-19 MED ORDER — TIZANIDINE HCL 2 MG PO TABS: 4.0000 mg | ORAL_TABLET | Freq: Three times a day (TID) | ORAL | Status: DC | PRN

## 2023-11-19 MED ORDER — MELATONIN 5 MG PO TABS
5.0000 mg | ORAL_TABLET | Freq: Every evening | ORAL | Status: DC | PRN
  Administered 2023-11-20 (×2): 5 mg via ORAL
  Filled 2023-11-19 (×2): qty 1

## 2023-11-19 MED ORDER — FENTANYL CITRATE PF 50 MCG/ML IJ SOSY
50.0000 ug | PREFILLED_SYRINGE | Freq: Once | INTRAMUSCULAR | Status: AC
  Administered 2023-11-19: 50 ug via INTRAVENOUS
  Filled 2023-11-19: qty 1

## 2023-11-19 MED ORDER — FLUOXETINE HCL 20 MG PO CAPS
20.0000 mg | ORAL_CAPSULE | Freq: Every day | ORAL | Status: DC
Start: 2023-11-19 — End: 2023-11-21
  Administered 2023-11-20 – 2023-11-21 (×2): 20 mg via ORAL
  Filled 2023-11-19 (×3): qty 1

## 2023-11-19 NOTE — ED Triage Notes (Signed)
Patient to ED by POV with c/o ABD pain. ABD pain has been going on for 3 days reports N/V/D. She sees Gastro ans sent to ED.

## 2023-11-19 NOTE — H&P (Signed)
History and Physical  Erin Wolf VFI:[id number] DOB: 01/20/1956 DOA: 11/19/2023  PCP: Jamey Reas, PA-C Patient coming from: Home  I have personally briefly reviewed patient's old medical records in [facility]   Chief Complaint: Abdominal pain and bright red blood per rectum in small amounts  HPI: Erin Wolf is a 68 y.o. female past medical history of ulcerative colitis, history of cholecystectomy diabetes mellitus type 2 who relates she started having abdominal pain and kind of watery stool about 8 weeks prior to admission since then she relates it has progressively gotten worse to the point that for the last 2 days her abdominal pain has been unbearable even without eating she is having 4-5 bowel movements a day, she relates that 2 days prior to admission she started seeing streaks of blood denies any recent antibiotic use. Some decreased oral intake.  Accompanied by nausea and vomiting.  She relates her loose stools are mucoid with blood over the last 2 days Denies any fever or sick contacts.  In the ED: She was found to be stable, afebrile potassium 3.3 creatinine at baseline no leukocytosis.  CT scan of the abdomen pelvis shows a mildly dilated small bowel loops, diverticulosis without acute diverticulitis, intra and extrahepatic biliary dilation has increased since previous exam.  Review of Systems: All systems reviewed and apart from history of presenting illness, are negative.  Past Medical History:  Diagnosis Date   Anxiety    Arthritis    Chronic abdominal pain    Chronic lower back pain    Chronic nausea    Depression    Diabetes mellitus without complication (HCC)    not on meds   Diverticulosis    Dyspnea    with exertion   Frequency of urination    GERD (gastroesophageal reflux disease)    Headache    Hematuria    Hepatitis    pt denies   Hepatitis C antibody positive in blood    per pt told by pcp   History of panic attacks     History of syncope    hx recurrent syncope --- per epic domentation non-cardiac , orthostatic hypotension, anxiety, dehydration   History of uterine fibroid    HTN (hypertension), benign    Hyperlipidemia 01/03/2012   IBS (irritable bowel syndrome)    Myalgia    Sciatic nerve disease, left 2023   Seizure disorder (HCC) followed by pcp until new neurologist appr. in jan 2019   "started having them in my 20's" ,  petit mal ----  per pt last seizure one 2015   SLE (systemic lupus erythematosus) [facility])    rheumatologist-- dr Sharmon Revere (consult 11-12-2016) having work-up done   Ulcerative colitis    followed by dr Evette Cristal at [facility]   Wears glasses    Past Surgical History:  Procedure Laterality Date   ABDOMINAL HYSTERECTOMY  1988   ARTERY BIOPSY Left 06/11/2022   Procedure: LEFT BIOPSY TEMPORAL ARTERY;  Surgeon: Sheliah Hatch De Blanch, MD;  Location: [facility];  Service: General;  Laterality: Left;   CHOLECYSTECTOMY OPEN  1978   COLONOSCOPY N/A 05/31/2013   Procedure: COLONOSCOPY;  Surgeon: Graylin Shiver, MD;  Location: [facility] END[facility]COPY;  Service: Endoscopy;  Laterality: N/A;   CYSTOSCOPY/RETROGRADE/URETEROSCOPY Bilateral 07/23/2017   Procedure: CYSTOSCOPY/RETROGRADE/URETEROSCOPY;  Surgeon: Rene Paci, MD;  Location: [facility];  Service: Urology;  Laterality: Bilateral;   detached retina on right eye surgery      ESOPHAGOGASTRODUODENOSCOPY (EGD)  WITH PROPOFOL N/A 05/11/2014   Procedure: ESOPHAGOGASTRODUODENOSCOPY (EGD) WITH PROPOFOL;  Surgeon: Shirley Friar, MD;  Location: [facility];  Service: Endoscopy;  Laterality: N/A;  egd first   ESOPHAGOGASTRODUODENOSCOPY (EGD) WITH PROPOFOL N/A 05/08/2020   Procedure: ESOPHAGOGASTRODUODENOSCOPY (EGD) WITH PROPOFOL;  Surgeon: Willis Modena, MD;  Location: [facility];  Service: Endoscopy;  Laterality: N/A;   EUS N/A 06/21/2016   Procedure: ESOPHAGEAL ENDOSCOPIC ULTRASOUND (EUS) RADIAL;  Surgeon: Willis Modena, MD;   Location: [facility];  Service: Endoscopy;  Laterality: N/A;   FLEXIBLE SIGMOIDOSCOPY N/A 05/11/2014   Procedure: FLEXIBLE SIGMOIDOSCOPY;  Surgeon: Shirley Friar, MD;  Location: [facility];  Service: Endoscopy;  Laterality: N/A;   LAPAROTOMY W/ BILATERAL SALPINGOOPHORECTOMY  09-26-2003   dr Marcelle [facility]lie [facility]   LAPROSCOPY W/ LYSIS ADHESIONS  06-08-2003   dr Arelia Sneddon [facility]   TOTAL HIP ARTHROPLASTY Left 12/03/2022   Procedure: TOTAL HIP ARTHROPLASTY ANTERIOR APPROACH;  Surgeon: Durene Romans, MD;  Location: [facility];  Service: Orthopedics;  Laterality: Left;   TOTAL KNEE ARTHROPLASTY Left 12/19/2020   Procedure: TOTAL KNEE ARTHROPLASTY;  Surgeon: Durene Romans, MD;  Location: [facility];  Service: Orthopedics;  Laterality: Left;  70 mins   TOTAL KNEE ARTHROPLASTY Right 03/27/2021   Procedure: TOTAL KNEE ARTHROPLASTY;  Surgeon: Durene Romans, MD;  Location: [facility];  Service: Orthopedics;  Laterality: Right;  70 mins   TRANSTHORACIC ECHOCARDIOGRAM  04/29/2016   ef 60-65%,  grade 2 diastolic dysfunction/  trivial MR and TR   TUBAL LIGATION Bilateral yrs ago   UPPER ESOPHAGEAL ENDOSCOPIC ULTRASOUND (EUS) N/A 05/08/2020   Procedure: UPPER ESOPHAGEAL ENDOSCOPIC ULTRASOUND (EUS);  Surgeon: Willis Modena, MD;  Location: [facility];  Service: Endoscopy;  Laterality: N/A;   Social History:  reports that she has never smoked. She has never used smokeless tobacco. She reports that she does not drink alcohol and does not use drugs.   Allergies  Allergen Reactions   Latex Other (See Comments)    Peels skin   Nsaids Other (See Comments)    HAS COLITIS FLARES   Wellbutrin [Bupropion Hcl] Other (See Comments)    Insomnia and headaches   Aspirin Nausea Only   Darvocet [Propoxyphene N-Acetaminophen] Nausea And Vomiting   Tape Hives   Toradol [Ketorolac Tromethamine] Nausea And Vomiting   Tylenol [Acetaminophen] Nausea Only   Butalbital-Apap-Caffeine     stomach upset   Ciprofloxacin     stomach upset    Eszopiclone Swelling   Other     Spicy foods cause diarrhea due to colitis   Topiramate     stomach upset    Family History  Problem Relation Age of Onset   Alzheimer's disease Mother    Hypertension Mother    Colon cancer Brother    Hypertension Brother    Other Father        ruptured appendix   Breast cancer Sister    Hypertension Sister    Hypertension Sister    Memory loss Brother    Hypertension Sister    Heart disease Sister     Prior to Admission medications   Medication Sig Start Date End Date Taking? Authorizing Provider  esomeprazole (NEXIUM) 40 MG capsule Take 40 mg by mouth daily as needed (acid reflux). 06/10/19   [provider]  FLUoxetine (PROZAC) 20 MG capsule Take 20 mg by mouth daily.     [provider]  furosemide (LASIX) 20 MG tablet Take 20 mg by mouth daily.    [provider]  HYDROmorphone (DILAUDID) 4 MG tablet Take 1 tablet (4 mg total) by mouth every 4 (four) hours as needed for severe pain. DO NOT take oxycodone while taking this medication. 12/04/22   Cassandria Anger, PA-C  lidocaine (HM LIDOCAINE PATCH) 4 % Place 1 patch onto the skin daily. 03/30/23   Jeanelle Malling, PA  Multiple Vitamins-Minerals (WOMENS MULTI PO) Take 1 tablet by mouth daily. Centrum    [provider]  naloxone [facility]) nasal spray 4 mg/0.1 mL Place 1 spray into the nose daily as needed (opioid overdose).    [provider]  ondansetron (ZOFRAN) 4 MG tablet Take 1 tablet (4 mg total) by mouth every 6 (six) hours. 08/09/23   Glyn Ade, MD  ondansetron (ZOFRAN-ODT) 4 MG disintegrating tablet Take 1 tablet (4 mg total) by mouth every 8 (eight) hours as needed. 03/30/23   Jeanelle Malling, PA  oxyCODONE (ROXICODONE) 5 MG immediate release tablet Take 1 tablet (5 mg total) by mouth every 4 (four) hours as needed for up to 12 doses for severe pain (pain score 7-10). 08/09/23   Glyn Ade, MD  Polyethyl Glycol-Propyl Glycol (SYSTANE OP) Place 1  drop into both eyes 2 (two) times daily as needed (dry eyes).    [provider]  polyethylene glycol (MIRALAX / GLYCOLAX) 17 g packet Take 17 g by mouth 2 (two) times daily. 12/04/22   Cassandria Anger, PA-C  rosuvastatin (CRESTOR) 10 MG tablet Take 10 mg by mouth every Monday.    [provider]  tiZANidine (ZANAFLEX) 4 MG tablet Take 1 tablet (4 mg total) by mouth every 8 (eight) hours as needed for muscle spasms. 12/04/22   Cassandria Anger, PA-C   Physical Exam: Vitals:   11/19/23 1336 11/19/23 1445 11/19/23 1515 11/19/23 1642  BP: (!) 126/96 (!) 141/105 (!) 147/108 (!) 151/94  Pulse: 97 87 91 94  Resp: 19   18  Temp: 98.2 F (36.8 C)     TempSrc: Oral     SpO2: 98% 100% 100% 99%  Weight:      Height:        General exam: Moderately built and nourished patient, lying comfortably. Head, eyes and ENT: Nontraumatic and normocephalic.  Neck: Supple. No JVD, carotid bruit or thyromegaly. Lymphatics: No lymphadenopathy. Respiratory system: Clear to auscultation. No increased work of breathing. Cardiovascular system: S1 and S2 heard, RRR. No JVD. Gastrointestinal system: Positive bowel sounds soft mostly left lower quadrant tenderness no rebound or guarding and some right but is predominantly on the left, positive bowel sounds Central nervous system: Alert and oriented. No focal neurological deficits. Extremities: Symmetric 5 x 5 power. Peripheral pulses symmetrically felt.  Skin: No rashes or acute findings. Musculoskeletal system: Negative exam.   Labs on Admission:  Basic Metabolic Panel: Recent Labs  Lab 11/19/23 1018  NA 136  K 3.3*  CL 107  CO2 19*  GLUCOSE 106*  BUN 12  CREATININE 0.61  CALCIUM 9.3   Liver Function Tests: Recent Labs  Lab 11/19/23 1018  AST 22  ALT 64*  ALKPHOS 71  BILITOT 0.4  PROT 8.0  ALBUMIN 3.8   Recent Labs  Lab 11/19/23 1018  LIPASE 32   No results for input(s): "AMMONIA" in the last 168 hours. CBC: Recent  Labs  Lab 11/19/23 1018  WBC 6.4  NEUTROABS 2.7  HGB 13.8  HCT 44.6  MCV 85.1  PLT 370   Cardiac Enzymes: No results for input(s): "CKTOTAL", "CKMB", "CKMBINDEX", "TROPONINI" in  the last 168 hours.  BNP (last 3 results) No results for input(s): "PROBNP" in the last 8760 hours. CBG: No results for input(s): "GLUCAP" in the last 168 hours.  Radiological Exams on Admission: CT ABDOMEN PELVIS W CONTRAST Result Date: 11/19/2023 CLINICAL DATA:  Abdominal pain for 3 days. Left lower quadrant abdominal pain. EXAM: CT ABDOMEN AND PELVIS WITH CONTRAST TECHNIQUE: Multidetector CT imaging of the abdomen and pelvis was performed using the standard protocol following bolus administration of intravenous contrast. RADIATION DOSE REDUCTION: This exam was performed according to the departmental dose-optimization program which includes automated exposure control, adjustment of the mA and/or kV according to patient size and/or use of iterative reconstruction technique. CONTRAST:  OMNIPAQUE IOHEXOL 300 MG/ML  SOLN COMPARISON:  08/09/2023 FINDINGS: Lower chest: Lung bases are clear. Hepatobiliary: Gallbladder has been removed. Again noted is intrahepatic and extrahepatic biliary dilatation. Biliary dilatation has decreased since the previous examination. Common bile duct measures 7 mm and previously measured 10 mm. Intrahepatic biliary dilatation is also decreased. Main portal venous system is patent. Pancreas: Stable dilatation of the main pancreatic duct measuring up to 5 mm. No acute pancreatic inflammation. Again noted is soft tissue fullness at the major papilla on image 34/2 and similar to the previous examination. Spleen: Spleen is small for size.  No focal abnormality. 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 Right adrenal normal. Normal appearance of both kidneys without hydronephrosis. No suspicious renal lesion. Normal urinary bladder. Stomach/Bowel: Normal stomach. Mildly  dilated loops of small bowel in the left abdomen measuring up to 2.9 cm. No focal bowel wall thickening. Colonic diverticula without acute bowel inflammation. Vascular/Lymphatic: No significant vascular findings are present. No enlarged abdominal or pelvic lymph nodes. Right ovarian vein is slightly prominent but similar to the previous examination. Reproductive: Status post hysterectomy. No adnexal masses. Other: Negative for free fluid.  Negative for free air. 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 Disc space narrowing at L3-L4, L4-L5 and L5-S1. No acute bone abnormality. IMPRESSION: 1. Mildly dilated loops of small bowel in the left abdomen. Findings are nonspecific but could be related to a mild ileus. 2. Colonic diverticulosis without acute bowel inflammation. 3. Intrahepatic and extrahepatic biliary dilatation has decreased since the previous examination. Stable dilatation of the main pancreatic duct. Stable fullness at the major papilla. 4. Stable left adrenal myelolipoma. Electronically Signed   By: Richarda Overlie M.D.   On: 11/19/2023 16:35   DG Chest Portable 1 View Result Date: 11/19/2023 CLINICAL DATA:  Upper abdominal pain EXAM: PORTABLE CHEST 1 VIEW COMPARISON:  05/16/2023 FINDINGS: The heart size and mediastinal contours are within normal limits. Both lungs are clear. The visualized skeletal structures are unremarkable. IMPRESSION: No active disease. Electronically Signed   By: Charlett Nose M.D.   On: 11/19/2023 10:50    EKG: Independently reviewed.  Normal sinus rhythm  Assessment/Plan Abdominal pain nausea vomiting and mucoid diarrhea over the last week question due to Ulcerative colitis [facility]): She relates some recent bright red blood per rectum, with abdominal pain nausea and vomiting.  She has no leukocytosis, has febrile. GI has been consulted by the ED physician [facility]. Will start on IV fluids with potassium supplement monitor strict I's and O's and daily weights. Recheck  potassium in the morning. Continue home regiment of narcotics for pain.  Will try to avoid IV pain medication. Globin has remained relatively stable at 13, she is probably slightly hemoconcentrated as she has had significant decreased oral intake over the  last several days. Will recheck in the morning.  HTN (hypertension), benign Currently on no antihypertensive medications at home. Pressure slightly elevated question of the likely disease due to pain.  Hyperlipidemia: Continue statins.  Hypokalemia Replete IV recheck in the morning.     DVT Prophylaxis: SCD Code Status: full  Family Communication: none  Disposition Plan: inpatient   Time spent: 75 min    It is my clinical opinion that admission to INPATIENT is reasonable and necessary in this 68 y.o. female past medical history ulcerative colitis comes in with abdominal pain bright red blood per rectum.  Given the aforementioned, the predictability of an adverse outcome is felt to be significant. I expect that the patient will require at least 2 midnights in the hospital to treat this condition.  Marinda Elk MD [facility]   11/19/2023, 5:29 PM

## 2023-11-19 NOTE — ED Notes (Signed)
Patient was sitting in wheelchair and leaned forward onto the floor. RN was in lobby and approached patient along with security and other staff. She did not hit her head she did not LOC and no injury from fall. She was able to stand with assistance an get back in chair. She is A&O, NAD and c/o pain that she originally checked in for ABD pain and back pain.

## 2023-11-19 NOTE — Consult Note (Signed)
[facility] Gastroenterology Consult  Referring Provider: No ref. provider found Primary Care Physician:  Jamey Reas, PA-C Primary Gastroenterologist: [facility] - Dr. Levora Angel   Reason for Consultation: abdominal pain, abnormal CT imaging, history ulcerative colitis  SUBJECTIVE:   HPI: Erin Wolf is a 68 y.o. female with past medical history significant for ulcerative colitis on no current medication therapy, history HCV, hypertension, hyperlipidemia, fibromyalgia, lupus. She presented to hospital on 11/19/23 with chief complaint of intractable abdominal pain, nausea, vomiting, diarrhea.   She noted that she has been experiencing abdominal pain for the past few months, though has been significantly worse over the past few days. Pain is located in LLQ, she initially thought this pain could be related to her hip as she has had to have a right hip replacement in the past. No NSAID use for pain relief. She has been experiencing mucous and streaks of blood per rectum. She has roughly 4-5 bowel movements per day. She has been having nausea and non-bloody emesis. She noted having a fever to 102 F prior to admission as well as night sweats. She has been losing weight unintentionally. She had what appears to be a malar rash in 08/2023 based on image she showed me on her phone.   Labs on presentation showed Hgb 13.8, WBC 6.4, PLT 370, AST/ALT 22/64, ALP 71, total bilirubin 0.4. CT scan of abdomen and pelvis showed possible left sided small bowel ileus, no focal bowel wall thickening, diverticulosis, pancreatic ductal dilatation 5 mm (stable), status post cholecystectomy, common bile duct 7 mm (stable/improved from prior).   Last colonoscopy 07/03/2022 for ulcerative colitis (Dr. Levora Angel) showed inactive ulcerative colitis, biopsies showed inactive colitis with no dysplasia, sigmoid tubular adenoma.   EUS on 05/08/2020 for dilated pancreatic duct and dilated common bile duct (Dr. Dulce Sellar) showed  prominent ampulla with no mass, no abnormality within the pancreas, pancreatic duct 5 mm, common bile duct 10 mm.   EGD 06/09/2019 for nausea/vomiting (Dr. Evette Cristal) showed LA grade A esophagitis, stomach and duodenum within normal limits.   Past Medical History:  Diagnosis Date   Anxiety    Arthritis    Chronic abdominal pain    Chronic lower back pain    Chronic nausea    Depression    Diabetes mellitus without complication (HCC)    not on meds   Diverticulosis    Dyspnea    with exertion   Frequency of urination    GERD (gastroesophageal reflux disease)    Headache    Hematuria    Hepatitis    pt denies   Hepatitis C antibody positive in blood    per pt told by pcp   History of panic attacks    History of syncope    hx recurrent syncope --- per epic domentation non-cardiac , orthostatic hypotension, anxiety, dehydration   History of uterine fibroid    HTN (hypertension), benign    Hyperlipidemia 01/03/2012   IBS (irritable bowel syndrome)    Myalgia    Sciatic nerve disease, left 2023   Seizure disorder (HCC) followed by pcp until new neurologist appr. in jan 2019   "started having them in my 20's" ,  petit mal ----  per pt last seizure one 2015   SLE (systemic lupus erythematosus) [facility])    rheumatologist-- dr Sharmon Revere (consult 11-12-2016) having work-up done   Ulcerative colitis    followed by dr Evette Cristal at [facility]   Wears glasses    Past Surgical History:  Procedure  Laterality Date   ABDOMINAL HYSTERECTOMY  1988   ARTERY BIOPSY Left 06/11/2022   Procedure: LEFT BIOPSY TEMPORAL ARTERY;  Surgeon: Sheliah Hatch, De Blanch, MD;  Location: [facility];  Service: General;  Laterality: Left;   CHOLECYSTECTOMY OPEN  1978   COLONOSCOPY N/A 05/31/2013   Procedure: COLONOSCOPY;  Surgeon: Graylin Shiver, MD;  Location: [facility] ENDOSCOPY;  Service: Endoscopy;  Laterality: N/A;   CYSTOSCOPY/RETROGRADE/URETEROSCOPY Bilateral 07/23/2017   Procedure: CYSTOSCOPY/RETROGRADE/URETEROSCOPY;  Surgeon:  Rene Paci, MD;  Location: [facility];  Service: Urology;  Laterality: Bilateral;   detached retina on right eye surgery      ESOPHAGOGASTRODUODENOSCOPY (EGD) WITH PROPOFOL N/A 05/11/2014   Procedure: ESOPHAGOGASTRODUODENOSCOPY (EGD) WITH PROPOFOL;  Surgeon: Shirley Friar, MD;  Location: [facility];  Service: Endoscopy;  Laterality: N/A;  egd first   ESOPHAGOGASTRODUODENOSCOPY (EGD) WITH PROPOFOL N/A 05/08/2020   Procedure: ESOPHAGOGASTRODUODENOSCOPY (EGD) WITH PROPOFOL;  Surgeon: Willis Modena, MD;  Location: [facility];  Service: Endoscopy;  Laterality: N/A;   EUS N/A 06/21/2016   Procedure: ESOPHAGEAL ENDOSCOPIC ULTRASOUND (EUS) RADIAL;  Surgeon: Willis Modena, MD;  Location: [facility];  Service: Endoscopy;  Laterality: N/A;   FLEXIBLE SIGMOIDOSCOPY N/A 05/11/2014   Procedure: FLEXIBLE SIGMOIDOSCOPY;  Surgeon: Shirley Friar, MD;  Location: [facility];  Service: Endoscopy;  Laterality: N/A;   LAPAROTOMY W/ BILATERAL SALPINGOOPHORECTOMY  09-26-2003   dr Marcelle Overlie at [facility]   LAPROSCOPY W/ LYSIS ADHESIONS  06-08-2003   dr Arelia Sneddon [facility]   TOTAL HIP ARTHROPLASTY Left 12/03/2022   Procedure: TOTAL HIP ARTHROPLASTY ANTERIOR APPROACH;  Surgeon: Durene Romans, MD;  Location: [facility];  Service: Orthopedics;  Laterality: Left;   TOTAL KNEE ARTHROPLASTY Left 12/19/2020   Procedure: TOTAL KNEE ARTHROPLASTY;  Surgeon: Durene Romans, MD;  Location: [facility];  Service: Orthopedics;  Laterality: Left;  70 mins   TOTAL KNEE ARTHROPLASTY Right 03/27/2021   Procedure: TOTAL KNEE ARTHROPLASTY;  Surgeon: Durene Romans, MD;  Location: [facility];  Service: Orthopedics;  Laterality: Right;  70 mins   TRANSTHORACIC ECHOCARDIOGRAM  04/29/2016   ef 60-65%,  grade 2 diastolic dysfunction/  trivial MR and TR   TUBAL LIGATION Bilateral yrs ago   UPPER ESOPHAGEAL ENDOSCOPIC ULTRASOUND (EUS) N/A 05/08/2020   Procedure: UPPER ESOPHAGEAL ENDOSCOPIC ULTRASOUND (EUS);  Surgeon: Willis Modena, MD;  Location: [facility];  Service: Endoscopy;  Laterality: N/A;   Prior to Admission medications   Medication Sig Start Date End Date Taking? Authorizing Provider  esomeprazole (NEXIUM) 40 MG capsule Take 40 mg by mouth daily as needed (acid reflux). 06/10/19   [provider]  FLUoxetine (PROZAC) 20 MG capsule Take 20 mg by mouth daily.     [provider]  furosemide (LASIX) 20 MG tablet Take 20 mg by mouth daily.    [provider]  HYDROmorphone (DILAUDID) 4 MG tablet Take 1 tablet (4 mg total) by mouth every 4 (four) hours as needed for severe pain. DO NOT take oxycodone while taking this medication. 12/04/22   Cassandria Anger, PA-C  lidocaine (HM LIDOCAINE PATCH) 4 % Place 1 patch onto the skin daily. 03/30/23   Jeanelle Malling, PA  Multiple Vitamins-Minerals (WOMENS MULTI PO) Take 1 tablet by mouth daily. Centrum    [provider]  naloxone [facility]) nasal spray 4 mg/0.1 mL Place 1 spray into the nose daily as needed (opioid overdose).    [provider]  ondansetron (ZOFRAN) 4 MG tablet Take 1 tablet (4 mg total) by mouth every 6 (six)  hours. 08/09/23   Glyn Ade, MD  ondansetron (ZOFRAN-ODT) 4 MG disintegrating tablet Take 1 tablet (4 mg total) by mouth every 8 (eight) hours as needed. 03/30/23   Jeanelle Malling, PA  oxyCODONE (ROXICODONE) 5 MG immediate release tablet Take 1 tablet (5 mg total) by mouth every 4 (four) hours as needed for up to 12 doses for severe pain (pain score 7-10). 08/09/23   Glyn Ade, MD  Polyethyl Glycol-Propyl Glycol (SYSTANE OP) Place 1 drop into both eyes 2 (two) times daily as needed (dry eyes).    [provider]  polyethylene glycol (MIRALAX / GLYCOLAX) 17 g packet Take 17 g by mouth 2 (two) times daily. 12/04/22   Cassandria Anger, PA-C  rosuvastatin (CRESTOR) 10 MG tablet Take 10 mg by mouth every Monday.    [provider]  tiZANidine (ZANAFLEX) 4 MG tablet Take 1 tablet (4 mg total)  by mouth every 8 (eight) hours as needed for muscle spasms. 12/04/22   Cassandria Anger, PA-C   Current Facility-Administered Medications  Medication Dose Route Frequency Provider Last Rate Last Admin   0.9 % NaCl with KCl 40 mEq / L  infusion   Intravenous Continuous Marinda Elk, MD       FLUoxetine (PROZAC) capsule 20 mg  20 mg Oral Daily Marinda Elk, MD       HYDROmorphone (DILAUDID) tablet 4 mg  4 mg Oral Q4H PRN Marinda Elk, MD       ondansetron [facility]) tablet 4 mg  4 mg Oral Q6H PRN Marinda Elk, MD       Or   ondansetron [facility]) injection 4 mg  4 mg Intravenous Q6H PRN Marinda Elk, MD       oxyCODONE (Oxy IR/ROXICODONE) immediate release tablet 5 mg  5 mg Oral Q4H PRN Marinda Elk, MD       pantoprazole (PROTONIX) EC tablet 40 mg  40 mg Oral Daily Marinda Elk, MD       Melene Muller ON 11/24/2023] rosuvastatin (CRESTOR) tablet 10 mg  10 mg Oral Q Mon Marinda Elk, MD       tiZANidine (ZANAFLEX) tablet 4 mg  4 mg Oral Q8H PRN Marinda Elk, MD       Current Outpatient Medications  Medication Sig Dispense Refill   esomeprazole (NEXIUM) 40 MG capsule Take 40 mg by mouth daily as needed (acid reflux).     FLUoxetine (PROZAC) 20 MG capsule Take 20 mg by mouth daily.      furosemide (LASIX) 20 MG tablet Take 20 mg by mouth daily.     HYDROmorphone (DILAUDID) 4 MG tablet Take 1 tablet (4 mg total) by mouth every 4 (four) hours as needed for severe pain. DO NOT take oxycodone while taking this medication. 42 tablet 0   lidocaine (HM LIDOCAINE PATCH) 4 % Place 1 patch onto the skin daily. 5 patch 0   Multiple Vitamins-Minerals (WOMENS MULTI PO) Take 1 tablet by mouth daily. Centrum     naloxone (NARCAN) nasal spray 4 mg/0.1 mL Place 1 spray into the nose daily as needed (opioid overdose).     ondansetron (ZOFRAN) 4 MG tablet Take 1 tablet (4 mg total) by mouth every 6 (six) hours. 12 tablet 0   ondansetron (ZOFRAN-ODT) 4 MG  disintegrating tablet Take 1 tablet (4 mg total) by mouth every 8 (eight) hours as needed. 20 tablet 0   oxyCODONE (ROXICODONE) 5 MG immediate release tablet Take 1  tablet (5 mg total) by mouth every 4 (four) hours as needed for up to 12 doses for severe pain (pain score 7-10). 12 tablet 0   Polyethyl Glycol-Propyl Glycol (SYSTANE OP) Place 1 drop into both eyes 2 (two) times daily as needed (dry eyes).     polyethylene glycol (MIRALAX / GLYCOLAX) 17 g packet Take 17 g by mouth 2 (two) times daily. 14 each 0   rosuvastatin (CRESTOR) 10 MG tablet Take 10 mg by mouth every Monday.     tiZANidine (ZANAFLEX) 4 MG tablet Take 1 tablet (4 mg total) by mouth every 8 (eight) hours as needed for muscle spasms. 30 tablet 2   Allergies as of 11/19/2023 - Review Complete 11/19/2023  Allergen Reaction Noted   Latex Other (See Comments) 06/20/2016   Nsaids Other (See Comments) 06/29/2016   Wellbutrin [bupropion hcl] Other (See Comments) 05/07/2011   Aspirin Nausea Only 05/07/2011   Darvocet [propoxyphene n-acetaminophen] Nausea And Vomiting 05/07/2011   Tape Hives 11/09/2013   Toradol [ketorolac tromethamine] Nausea And Vomiting 03/19/2016   Tylenol [acetaminophen] Nausea Only 10/22/2014   Butalbital-apap-caffeine  01/24/2021   Ciprofloxacin  01/24/2021   Eszopiclone Swelling 01/24/2021   Other  08/01/2017   Topiramate  01/24/2021   Family History  Problem Relation Age of Onset   Alzheimer's disease Mother    Hypertension Mother    Colon cancer Brother    Hypertension Brother    Other Father        ruptured appendix   Breast cancer Sister    Hypertension Sister    Hypertension Sister    Memory loss Brother    Hypertension Sister    Heart disease Sister    Social History   Socioeconomic History   Marital status: Married    Spouse name: Not on file   Number of children: 1   Years of education: 2 years college   Highest education level: Not on file  Occupational History   Occupation:  Retired  Tobacco Use   Smoking status: Never   Smokeless tobacco: Never  Vaping Use   Vaping status: Never Used  Substance and Sexual Activity   Alcohol use: Never    Comment: occ   Drug use: No   Sexual activity: Not Currently  Other Topics Concern   Not on file  Social History Narrative   Lives at home with husband.   Right-handed.   No caffeine use.   Social Drivers of Corporate investment banker Strain: Not on file  Food Insecurity: No Food Insecurity (12/03/2022)   Hunger Vital Sign    Worried About Running Out of Food in the Last Year: Never true    Ran Out of Food in the Last Year: Never true  Transportation Needs: No Transportation Needs (12/03/2022)   PRAPARE - Administrator, Civil Service (Medical): No    Lack of Transportation (Non-Medical): No  Physical Activity: Not on file  Stress: Not on file  Social Connections: Unknown (03/01/2022)   [facility]   Social Network    Social Network: Not on file  Intimate Partner Violence: Not At Risk (12/03/2022)   Humiliation, Afraid, Rape, and Kick questionnaire    Fear of Current or Ex-Partner: No    Emotionally Abused: No    Physically Abused: No    Sexually Abused: No   Review of Systems:  Review of Systems  Constitutional:  Positive for fever and weight loss.  Respiratory:  Positive  for shortness of breath.   Cardiovascular:  Negative for chest pain.  Gastrointestinal:  Positive for abdominal pain, blood in stool, diarrhea, nausea and vomiting.  Musculoskeletal:  Positive for joint pain.    OBJECTIVE:   Temp:  [98 F (36.7 C)-98.6 F (37 C)] 98.6 F (37 C) (01/29 1744) Pulse Rate:  [87-111] 94 (01/29 1642) Resp:  [18-19] 18 (01/29 1642) BP: (126-151)/(94-108) 151/94 (01/29 1642) SpO2:  [98 %-100 %] 99 % (01/29 1642) Weight:  [161 kg] 103 kg (01/29 1005)   Physical Exam Constitutional:      General: She is not in acute distress.    Appearance: She is not  ill-appearing, toxic-appearing or diaphoretic.  Cardiovascular:     Rate and Rhythm: Normal rate and regular rhythm.  Pulmonary:     Effort: No respiratory distress.     Breath sounds: Normal breath sounds.  Abdominal:     General: Bowel sounds are normal. There is no distension.     Palpations: Abdomen is soft.     Tenderness: There is abdominal tenderness (diffuse).  Musculoskeletal:     Right lower leg: No edema.     Left lower leg: No edema.  Skin:    General: Skin is warm and dry.  Neurological:     Mental Status: She is alert.     Labs: Recent Labs    11/19/23 1018  WBC 6.4  HGB 13.8  HCT 44.6  PLT 370   BMET Recent Labs    11/19/23 1018  NA 136  K 3.3*  CL 107  CO2 19*  GLUCOSE 106*  BUN 12  CREATININE 0.61  CALCIUM 9.3   LFT Recent Labs    11/19/23 1018  PROT 8.0  ALBUMIN 3.8  AST 22  ALT 64*  ALKPHOS 71  BILITOT 0.4   PT/INR No results for input(s): "LABPROT", "INR" in the last 72 hours.  Diagnostic imaging: CT ABDOMEN PELVIS W CONTRAST Result Date: 11/19/2023 CLINICAL DATA:  Abdominal pain for 3 days. Left lower quadrant abdominal pain. EXAM: CT ABDOMEN AND PELVIS WITH CONTRAST TECHNIQUE: Multidetector CT imaging of the abdomen and pelvis was performed using the standard protocol following bolus administration of intravenous contrast. RADIATION DOSE REDUCTION: This exam was performed according to the departmental dose-optimization program which includes automated exposure control, adjustment of the mA and/or kV according to patient size and/or use of iterative reconstruction technique. CONTRAST:  OMNIPAQUE IOHEXOL 300 MG/ML  SOLN COMPARISON:  08/09/2023 FINDINGS: Lower chest: Lung bases are clear. Hepatobiliary: Gallbladder has been removed. Again noted is intrahepatic and extrahepatic biliary dilatation. Biliary dilatation has decreased since the previous examination. Common bile duct measures 7 mm and previously measured 10 mm. Intrahepatic  biliary dilatation is also decreased. Main portal venous system is patent. Pancreas: Stable dilatation of the main pancreatic duct measuring up to 5 mm. No acute pancreatic inflammation. Again noted is soft tissue fullness at the major papilla on image 34/2 and similar to the previous examination. Spleen: Spleen is small for size.  No focal abnormality. 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 Right adrenal normal. Normal appearance of both kidneys without hydronephrosis. No suspicious renal lesion. Normal urinary bladder. Stomach/Bowel: Normal stomach. Mildly dilated loops of small bowel in the left abdomen measuring up to 2.9 cm. No focal bowel wall thickening. Colonic diverticula without acute bowel inflammation. Vascular/Lymphatic: No significant vascular findings are present. No enlarged abdominal or pelvic lymph nodes. Right ovarian vein is slightly prominent but similar to the  previous examination. Reproductive: Status post hysterectomy. No adnexal masses. Other: Negative for free fluid.  Negative for free air. 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 Disc space narrowing at L3-L4, L4-L5 and L5-S1. No acute bone abnormality. IMPRESSION: 1. Mildly dilated loops of small bowel in the left abdomen. Findings are nonspecific but could be related to a mild ileus. 2. Colonic diverticulosis without acute bowel inflammation. 3. Intrahepatic and extrahepatic biliary dilatation has decreased since the previous examination. Stable dilatation of the main pancreatic duct. Stable fullness at the major papilla. 4. Stable left adrenal myelolipoma. Electronically Signed   By: Richarda Overlie M.D.   On: 11/19/2023 16:35   DG Chest Portable 1 View Result Date: 11/19/2023 CLINICAL DATA:  Upper abdominal pain EXAM: PORTABLE CHEST 1 VIEW COMPARISON:  05/16/2023 FINDINGS: The heart size and mediastinal contours are within normal limits. Both lungs are clear. The visualized skeletal structures are  unremarkable. IMPRESSION: No active disease. Electronically Signed   By: Charlett Nose M.D.   On: 11/19/2023 10:50   IMPRESSION: Diffuse abdominal pain Abnormal CT imaging, small bowel ileus Ulcerative colitis, no current medication therapy  -Last colonoscopy in 2023 with inactive disease, no dysplasia  -Previously on Rowasa enemas Diarrhea Blood per rectum History hepatitis C  -Labs 01/06/2012 showed negative HCV quant Family history colon cancer in brother (diagnosed in late 58s)  PLAN: -Supportive care for ileus -Check fecal calprotectin, would benefit from follow up in office with Dr. Levora Angel given blood per rectum and diarrhea, no signs of active inflammation based on imaging, labs are stable, no role for steroids at current juncture -Ok for full liquid diet -Monitor bowel movements  -[facility] will follow   LOS: 0 days   Liliane Shi, [facility] Gastroenterology

## 2023-11-19 NOTE — ED Provider Notes (Signed)
[facility] Provider Note   CSN: [id number] Arrival date & time: 11/19/23  4132     History  Chief Complaint  Patient presents with   Abdominal Pain    Erin [facility] is a 68 y.o. female.   Abdominal Pain    Patient has history of ulcerative colitis hyperlipidemia lupus acid reflux-year-old bowel syndrome seizures, diabetes, hepatitis, sciatica Pt has been having trouble with diarrhea and abd pain and vomiting.   She has been having very loose mucoid stools.   SHe has been having a lot of pain in her abdomen.  She was supposed to see GI today and they told her to come to the ED instead.  SHe feels like something is twisted inside.  Sx started several days ago.  She had a fever on Sunday.  None since.  Patient states she has a history of gastrointestinal problems.  She has been seeing [facility] gastroenterology.  Patient states she was told to come to the ED to be [facility] to [facility] for a colonoscopy and endoscopy Home Medications Prior to Admission medications   Medication Sig Start Date End Date Taking? Authorizing Provider  esomeprazole (NEXIUM) 40 MG capsule Take 40 mg by mouth daily as needed (acid reflux). 06/10/19   [provider]  FLUoxetine (PROZAC) 20 MG capsule Take 20 mg by mouth daily.     [provider]  furosemide (LASIX) 20 MG tablet Take 20 mg by mouth daily.    [provider]  HYDROmorphone (DILAUDID) 4 MG tablet Take 1 tablet (4 mg total) by mouth every 4 (four) hours as needed for severe pain. DO NOT take oxycodone while taking this medication. 12/04/22   Cassandria Anger, PA-C  lidocaine (HM LIDOCAINE PATCH) 4 % Place 1 patch onto the skin daily. 03/30/23   Jeanelle Malling, PA  Multiple Vitamins-Minerals (WOMENS MULTI PO) Take 1 tablet by mouth daily. Centrum    [provider]  naloxone [facility]) nasal spray 4 mg/0.1 mL Place 1 spray into the nose daily as needed (opioid overdose).     [provider]  ondansetron (ZOFRAN) 4 MG tablet Take 1 tablet (4 mg total) by mouth every 6 (six) hours. 08/09/23   Glyn Ade, MD  ondansetron (ZOFRAN-ODT) 4 MG disintegrating tablet Take 1 tablet (4 mg total) by mouth every 8 (eight) hours as needed. 03/30/23   Jeanelle Malling, PA  oxyCODONE (ROXICODONE) 5 MG immediate release tablet Take 1 tablet (5 mg total) by mouth every 4 (four) hours as needed for up to 12 doses for severe pain (pain score 7-10). 08/09/23   Glyn Ade, MD  Polyethyl Glycol-Propyl Glycol (SYSTANE OP) Place 1 drop into both eyes 2 (two) times daily as needed (dry eyes).    [provider]  polyethylene glycol (MIRALAX / GLYCOLAX) 17 g packet Take 17 g by mouth 2 (two) times daily. 12/04/22   Cassandria Anger, PA-C  rosuvastatin (CRESTOR) 10 MG tablet Take 10 mg by mouth every Monday.    [provider]  tiZANidine (ZANAFLEX) 4 MG tablet Take 1 tablet (4 mg total) by mouth every 8 (eight) hours as needed for muscle spasms. 12/04/22   Cassandria Anger, PA-C      Allergies    Latex, Nsaids, Wellbutrin [bupropion hcl], Aspirin, Darvocet [propoxyphene n-acetaminophen], Tape, Toradol [ketorolac tromethamine], Tylenol [acetaminophen], Butalbital-apap-caffeine, Ciprofloxacin, Eszopiclone, Other, and Topiramate    Review of Systems   Review of Systems  Gastrointestinal:  Positive for abdominal pain.    Physical Exam Updated Vital Signs BP (!) 151/94   Pulse 94   Temp 98.2 F (36.8 C) (Oral)   Resp 18   Ht 1.626 m (5\' 4" )   Wt 103 kg   SpO2 99%   BMI 38.96 kg/m  Physical Exam Vitals and nursing note reviewed.  Constitutional:      Appearance: She is well-developed.     Comments: Appears to be in pain  HENT:     Head: Normocephalic and atraumatic.     Right Ear: External ear normal.     Left Ear: External ear normal.  Eyes:     General: No scleral icterus.       Right eye: No discharge.        Left eye: No discharge.      Conjunctiva/sclera: Conjunctivae normal.  Neck:     Trachea: No tracheal deviation.  Cardiovascular:     Rate and Rhythm: Normal rate and regular rhythm.  Pulmonary:     Effort: Pulmonary effort is normal. No respiratory distress.     Breath sounds: Normal breath sounds. No stridor. No wheezing or rales.  Abdominal:     General: Bowel sounds are normal. There is no distension.     Palpations: Abdomen is soft.     Tenderness: There is generalized abdominal tenderness. There is no guarding or rebound.  Musculoskeletal:        General: No tenderness or deformity.     Cervical back: Neck supple.  Skin:    General: Skin is warm and dry.     Findings: No rash.  Neurological:     General: No focal deficit present.     Mental Status: She is alert.     Cranial Nerves: No cranial nerve deficit, dysarthria or facial asymmetry.     Sensory: No sensory deficit.     Motor: No abnormal muscle tone or seizure activity.     Coordination: Coordination normal.  Psychiatric:        Mood and Affect: Mood normal.     ED Results / Procedures / Treatments   Labs (all labs ordered are listed, but only abnormal results are displayed) Labs Reviewed  COMPREHENSIVE METABOLIC PANEL - Abnormal; Notable for the following components:      Result Value   Potassium 3.3 (*)    CO2 19 (*)    Glucose, Bld 106 (*)    ALT 64 (*)    All other components within normal limits  CBC WITH DIFFERENTIAL/PLATELET - Abnormal; Notable for the following components:   RBC 5.24 (*)    All other components within normal limits  URINALYSIS, ROUTINE W REFLEX MICROSCOPIC - Abnormal; Notable for the following components:   APPearance HAZY (*)    Protein, ur 30 (*)    Bacteria, UA RARE (*)    All other components within normal limits  GASTROINTESTINAL PANEL BY PCR, STOOL (REPLACES STOOL CULTURE)  LIPASE, BLOOD    EKG None  Radiology CT ABDOMEN PELVIS W CONTRAST Result Date: 11/19/2023 CLINICAL DATA:  Abdominal pain  for 3 days. Left lower quadrant abdominal pain. EXAM: CT ABDOMEN AND PELVIS WITH CONTRAST TECHNIQUE: Multidetector CT imaging of the abdomen and pelvis was performed using the standard protocol following bolus administration of intravenous contrast. RADIATION DOSE REDUCTION: This exam was performed according to the departmental dose-optimization program which includes automated exposure control, adjustment of the mA and/or kV according to patient size and/or use of iterative reconstruction  technique. CONTRAST:  OMNIPAQUE IOHEXOL 300 MG/ML  SOLN COMPARISON:  08/09/2023 FINDINGS: Lower chest: Lung bases are clear. Hepatobiliary: Gallbladder has been removed. Again noted is intrahepatic and extrahepatic biliary dilatation. Biliary dilatation has decreased since the previous examination. Common bile duct measures 7 mm and previously measured 10 mm. Intrahepatic biliary dilatation is also decreased. Main portal venous system is patent. Pancreas: Stable dilatation of the main pancreatic duct measuring up to 5 mm. No acute pancreatic inflammation. Again noted is soft tissue fullness at the major papilla on image 34/2 and similar to the previous examination. Spleen: Spleen is small for size.  No focal abnormality. 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 Right adrenal normal. Normal appearance of both kidneys without hydronephrosis. No suspicious renal lesion. Normal urinary bladder. Stomach/Bowel: Normal stomach. Mildly dilated loops of small bowel in the left abdomen measuring up to 2.9 cm. No focal bowel wall thickening. Colonic diverticula without acute bowel inflammation. Vascular/Lymphatic: No significant vascular findings are present. No enlarged abdominal or pelvic lymph nodes. Right ovarian vein is slightly prominent but similar to the previous examination. Reproductive: Status post hysterectomy. No adnexal masses. Other: Negative for free fluid.  Negative for free air. Musculoskeletal:  Left hip arthroplasty is located. Disc space narrowing at L3-L4, L4-L5 and L5-S1. No acute bone abnormality. IMPRESSION: 1. Mildly dilated loops of small bowel in the left abdomen. Findings are nonspecific but could be related to a mild ileus. 2. Colonic diverticulosis without acute bowel inflammation. 3. Intrahepatic and extrahepatic biliary dilatation has decreased since the previous examination. Stable dilatation of the main pancreatic duct. Stable fullness at the major papilla. 4. Stable left adrenal myelolipoma. Electronically Signed   By: Richarda Overlie M.D.   On: 11/19/2023 16:35   DG Chest Portable 1 View Result Date: 11/19/2023 CLINICAL DATA:  Upper abdominal pain EXAM: PORTABLE CHEST 1 VIEW COMPARISON:  05/16/2023 FINDINGS: The heart size and mediastinal contours are within normal limits. Both lungs are clear. The visualized skeletal structures are unremarkable. IMPRESSION: No active disease. Electronically Signed   By: Charlett Nose M.D.   On: 11/19/2023 10:50    Procedures Procedures    Medications Ordered in ED Medications  fentaNYL (SUBLIMAZE) injection 50 mcg (has no administration in time range)  fentaNYL (SUBLIMAZE) injection 50 mcg (50 mcg Intravenous Given 11/19/23 1450)  ondansetron (ZOFRAN) injection 4 mg (4 mg Intravenous Given 11/19/23 1449)  iohexol (OMNIPAQUE) 300 MG/ML solution 100 mL (100 mLs Intravenous Contrast Given 11/19/23 1502)  fentaNYL (SUBLIMAZE) injection 50 mcg (50 mcg Intravenous Given 11/19/23 1552)    ED Course/ Medical Decision Making/ A&P Clinical Course as of 11/19/23 1710  Wed Nov 19, 2023  1344 CBC normal.  Metabolic panel shows mild hypokalemia.  Lipase normal [JK]  1344 Chest x-ray without acute findings [JK]  1648 CT scan shows mild dilated loops of small bowel in the abdomen.  Possible ileus.  No signs of diverticulitis. [JK]  1657 Discussed with Dr Lorenso Quarry.  Will see pt in consultation.   Reasonable to admit to medical service, possibly related to  her UC [JK]  1705 Requesting additional pain meds. [JK]  1709 Case discussed with Dr Radonna Ricker regarding admission [JK]    Clinical Course User Index [JK] Linwood Dibbles, MD                                 Medical Decision Making Problems Addressed: Abdominal pain, unspecified abdominal  location: acute illness or injury that poses a threat to life or bodily functions Ileus [facility]): acute illness or injury that poses a threat to life or bodily functions  Amount and/or Complexity of Data Reviewed Labs: ordered. Decision-making details documented in ED Course. Radiology: ordered and independent interpretation performed.  Risk Prescription drug management. Decision regarding hospitalization.   Patient presented to ED for evaluation of persistent abdominal pain.  Patient has history of ulcerative colitis.  No signs of leukocytosis.  Urinalysis shows hematuria but not suggestive of infection.  No signs of hepatitis or pancreatitis.  Patient however required several doses of IV narcotic pain medications.  She continued to have loose mucoid stools in the ED.  I have ordered stool studies.  CT scan does show findings suggestive of ileus.  Spoke with Dr. Armanda Heritage gastroenterology.  She will see the patient in consultation.  Does feel that it is reasonable to bring her in for observation considering her ileus.  I will consult the medical service for admission        Final Clinical Impression(s) / ED Diagnoses Final diagnoses:  Ileus (HCC)  Abdominal pain, unspecified abdominal location    Rx / DC Orders ED Discharge Orders     None         Linwood Dibbles, MD 11/19/23 1710

## 2023-11-19 NOTE — ED Notes (Signed)
Lab called, only have enough stool to run the GI Panel. Will continue to attempt to get another stool to run the Calprotectin,Fecal stool.

## 2023-11-19 NOTE — ED Provider Triage Note (Signed)
Emergency Medicine Provider Triage Evaluation Note  Erin [facility] , a 68 y.o. female  was evaluated in triage.  Pt complains of nausea, vomiting and abdominal pain.  Notes her last normal bowel movement was 4 days ago, has had some loose runny stool since then.  Patient does note that it is painful when she is taking deep breath in.  Review of Systems  Positive: Abdominal pain Negative: Chest pain, cough, shortness of breath  Physical Exam  BP (!) 150/102 (BP Location: Left Arm)   Pulse (!) 111   Temp 98 F (36.7 C) (Oral)   Resp 18   Ht 5\' 4"  (1.626 m)   Wt 103 kg   SpO2 100%   BMI 38.96 kg/m  Gen:   Awake, no distress, tearful requesting pain medications.  Resp:  Normal effort  MSK:   Moves extremities without difficulty  Other:  Tenderness to palpation throughout abd, worse in left lower quadrant   Medical Decision Making  Medically screening exam initiated at 10:10 AM.  Appropriate orders placed.  Erin Wolf was informed that the remainder of the evaluation will be completed by another provider, this initial triage assessment does not replace that evaluation, and the importance of remaining in the ED until their evaluation is complete.  Reporting she was seen by [facility] this morning.  They sent her here for further evaluation.  She is unsure if she was sent here for CT scan versus admission. Note is not in at this time thus I can't review there recommendations.  Patient has history of prior abdominal surgery.  She has history of ulcerative colitis, irritable bowel syndrome and chronic abdominal pain.   Smitty Knudsen, PA-C 11/19/23 1014

## 2023-11-20 ENCOUNTER — Encounter (HOSPITAL_COMMUNITY): Payer: Self-pay | Admitting: Internal Medicine

## 2023-11-20 DIAGNOSIS — K51019 Ulcerative (chronic) pancolitis with unspecified complications: Secondary | ICD-10-CM | POA: Diagnosis not present

## 2023-11-20 DIAGNOSIS — R1084 Generalized abdominal pain: Secondary | ICD-10-CM

## 2023-11-20 LAB — COMPREHENSIVE METABOLIC PANEL
ALT: 48 U/L — ABNORMAL HIGH (ref 0–44)
AST: 23 U/L (ref 15–41)
Albumin: 3.4 g/dL — ABNORMAL LOW (ref 3.5–5.0)
Alkaline Phosphatase: 62 U/L (ref 38–126)
Anion gap: 7 (ref 5–15)
BUN: 14 mg/dL (ref 8–23)
CO2: 23 mmol/L (ref 22–32)
Calcium: 8.7 mg/dL — ABNORMAL LOW (ref 8.9–10.3)
Chloride: 108 mmol/L (ref 98–111)
Creatinine, Ser: 0.6 mg/dL (ref 0.44–1.00)
GFR, Estimated: >60 mL/min (ref >60)
Glucose, Bld: 103 mg/dL — ABNORMAL HIGH (ref 70–99)
Potassium: 4.1 mmol/L (ref 3.5–5.1)
Sodium: 138 mmol/L (ref 135–145)
Total Bilirubin: 0.5 mg/dL (ref 0.0–1.2)
Total Protein: 7.1 g/dL (ref 6.5–8.1)

## 2023-11-20 LAB — GASTROINTESTINAL PANEL BY PCR, STOOL (REPLACES STOOL CULTURE)

## 2023-11-20 LAB — CBC
HCT: 40.3 % (ref 36.0–46.0)
Hemoglobin: 12.5 g/dL (ref 12.0–15.0)
MCH: 26.5 pg (ref 26.0–34.0)
MCHC: 31 g/dL (ref 30.0–36.0)
MCV: 85.4 fL (ref 80.0–100.0)
Platelets: 304 10*3/uL (ref 150–400)
RBC: 4.72 MIL/uL (ref 3.87–5.11)
RDW: 15.2 % (ref 11.5–15.5)
WBC: 7.2 10*3/uL (ref 4.0–10.5)
nRBC: 0 % (ref 0.0–0.2)

## 2023-11-20 LAB — HIV ANTIBODY (ROUTINE TESTING W REFLEX): HIV Screen 4th Generation wRfx: NONREACTIVE

## 2023-11-20 LAB — SEDIMENTATION RATE: Sed Rate: 38 mm/h — ABNORMAL HIGH (ref 0–22)

## 2023-11-20 MED ORDER — PROCHLORPERAZINE EDISYLATE 10 MG/2ML IJ SOLN
5.0000 mg | Freq: Four times a day (QID) | INTRAMUSCULAR | Status: DC | PRN
  Administered 2023-11-20 – 2023-11-21 (×2): 5 mg via INTRAVENOUS
  Filled 2023-11-20 (×3): qty 2

## 2023-11-20 MED ORDER — HYDROMORPHONE HCL 1 MG/ML IJ SOLN
1.0000 mg | INTRAMUSCULAR | Status: AC | PRN
  Administered 2023-11-20 – 2023-11-21 (×3): 1 mg via INTRAVENOUS
  Filled 2023-11-20 (×3): qty 1

## 2023-11-20 MED ORDER — PANTOPRAZOLE SODIUM 40 MG PO TBEC
40.0000 mg | DELAYED_RELEASE_TABLET | Freq: Two times a day (BID) | ORAL | Status: DC
  Administered 2023-11-20 – 2023-11-21 (×2): 40 mg via ORAL
  Filled 2023-11-20 (×2): qty 1

## 2023-11-20 MED ORDER — AMLODIPINE BESYLATE 5 MG PO TABS
5.0000 mg | ORAL_TABLET | Freq: Every day | ORAL | Status: DC
  Administered 2023-11-20 – 2023-11-21 (×2): 5 mg via ORAL
  Filled 2023-11-20 (×2): qty 1

## 2023-11-20 MED ORDER — ALUM & MAG HYDROXIDE-SIMETH 200-200-20 MG/5ML PO SUSP
30.0000 mL | ORAL | Status: DC | PRN
  Administered 2023-11-20: 30 mL via ORAL
  Filled 2023-11-20: qty 30

## 2023-11-20 MED ORDER — HYDROMORPHONE HCL 2 MG PO TABS: 4.0000 mg | ORAL_TABLET | ORAL | Status: DC | PRN

## 2023-11-20 MED ORDER — PROCHLORPERAZINE EDISYLATE 10 MG/2ML IJ SOLN
5.0000 mg | Freq: Four times a day (QID) | INTRAMUSCULAR | Status: AC | PRN
  Administered 2023-11-20 (×2): 5 mg via INTRAVENOUS
  Filled 2023-11-20 (×2): qty 2

## 2023-11-20 MED ORDER — BOOST / RESOURCE BREEZE PO LIQD CUSTOM
1.0000 | Freq: Three times a day (TID) | ORAL | Status: DC
  Administered 2023-11-20 – 2023-11-21 (×2): 1 via ORAL

## 2023-11-20 NOTE — Progress Notes (Signed)
[facility] PROGRESS NOTE    Progress Note  Erin Wolf  ZOX:[id number] DOB: 10-13-1956 DOA: 11/19/2023 PCP: Jamey Reas, PA-C     Brief Narrative:   Erin [facility] is an 68 y.o. female past medical history of ulcerative colitis history of cholecystectomy, diabetes mellitus type 2 that started having abdominal pain on watery stool about 2 months prior to admission progressively got worst came into the ED because of decreased oral intake nausea and vomiting and loose stools    Assessment/Plan:   Diffuse abdominal pain with bloody red per rectum: CT scan of the abdomen pelvis showed no inflammatory changes. Her last colonoscopy was in 2023 showing active disease. GI was consulted that recommended calprotectin, no need for steroids at this point in time. Okay for a full liquid diet. She was not able to tolerate her food, change to compazine. Continue to monitor bowel movements. Hemoglobin is pending. Continue oral narcotics for pain control. Check ESR.  Ulcerative colitis (HCC) Appears not to have a flare continue to monitor.  HTN (hypertension), benign Currently on no antihypertensive medications at home. Blood pressure is elevated, will start on low-dose Norvasc.  Hypokalemia: Repeating IV basic metabolic panels pending this morning.  Hyperlipidemia: Continue statins.  DVT prophylaxis: SCD Family Communication:none Status is: Inpatient Remains inpatient appropriate because: abd pain    Code Status:     Code Status Orders  (From admission, onward)           Start     Ordered   11/19/23 1733  Full code  Continuous       Question:  By:  Answer:  Consent: discussion documented in EHR   11/19/23 1737           Code Status History     Date Active Date Inactive Code Status Order ID Comments User Context   12/03/2022 1250 12/04/2022 1900 Full Code [id number]  Cordella Register Inpatient   03/27/2021 1103 03/28/2021 1951 Full Code  [id number]  Cordella Register Inpatient   12/19/2020 1749 12/20/2020 1914 Full Code [id number]  Shelly Coss Inpatient   05/05/2020 1720 05/09/2020 1718 Full Code [id number]  Jerald Kief, MD Inpatient   10/18/2016 0141 10/19/2016 1738 Full Code [id number]  Eduard Clos, MD ED   06/29/2016 2015 07/01/2016 2026 Full Code [id number]  Lorretta Harp, MD ED   05/09/2014 1433 05/12/2014 1640 Full Code [id number]  Eddie North, MD Inpatient   05/27/2013 2003 05/31/2013 2115 Full Code [id number]  Kela Millin, MD Inpatient   01/03/2012 2011 01/07/2012 2327 Full Code [id number]  Atlee Abide ED         IV Access:   Peripheral IV   Procedures and diagnostic studies:   CT ABDOMEN PELVIS W CONTRAST Result Date: 11/19/2023 CLINICAL DATA:  Abdominal pain for 3 days. Left lower quadrant abdominal pain. EXAM: CT ABDOMEN AND PELVIS WITH CONTRAST TECHNIQUE: Multidetector CT imaging of the abdomen and pelvis was performed using the standard protocol following bolus administration of intravenous contrast. RADIATION DOSE REDUCTION: This exam was performed according to the departmental dose-optimization program which includes automated exposure control, adjustment of the mA and/or kV according to patient size and/or use of iterative reconstruction technique. CONTRAST:  OMNIPAQUE IOHEXOL 300 MG/ML  SOLN COMPARISON:  08/09/2023 FINDINGS: Lower chest: Lung bases are clear. Hepatobiliary: Gallbladder has been removed. Again noted is intrahepatic and extrahepatic biliary dilatation. Biliary dilatation has decreased since the previous examination. Common bile duct measures 7  mm and previously measured 10 mm. Intrahepatic biliary dilatation is also decreased. Main portal venous system is patent. Pancreas: Stable dilatation of the main pancreatic duct measuring up to 5 mm. No acute pancreatic inflammation. Again noted is soft tissue fullness at the major papilla on image 34/2 and similar to the previous  examination. Spleen: Spleen is small for size.  No focal abnormality. 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 Right adrenal normal. Normal appearance of both kidneys without hydronephrosis. No suspicious renal lesion. Normal urinary bladder. Stomach/Bowel: Normal stomach. Mildly dilated loops of small bowel in the left abdomen measuring up to 2.9 cm. No focal bowel wall thickening. Colonic diverticula without acute bowel inflammation. Vascular/Lymphatic: No significant vascular findings are present. No enlarged abdominal or pelvic lymph nodes. Right ovarian vein is slightly prominent but similar to the previous examination. Reproductive: Status post hysterectomy. No adnexal masses. Other: Negative for free fluid.  Negative for free air. 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 Disc space narrowing at L3-L4, L4-L5 and L5-S1. No acute bone abnormality. IMPRESSION: 1. Mildly dilated loops of small bowel in the left abdomen. Findings are nonspecific but could be related to a mild ileus. 2. Colonic diverticulosis without acute bowel inflammation. 3. Intrahepatic and extrahepatic biliary dilatation has decreased since the previous examination. Stable dilatation of the main pancreatic duct. Stable fullness at the major papilla. 4. Stable left adrenal myelolipoma. Electronically Signed   By: Richarda Overlie M.D.   On: 11/19/2023 16:35   DG Chest Portable 1 View Result Date: 11/19/2023 CLINICAL DATA:  Upper abdominal pain EXAM: PORTABLE CHEST 1 VIEW COMPARISON:  05/16/2023 FINDINGS: The heart size and mediastinal contours are within normal limits. Both lungs are clear. The visualized skeletal structures are unremarkable. IMPRESSION: No active disease. Electronically Signed   By: Charlett Nose M.D.   On: 11/19/2023 10:50     Medical Consultants:   None.   Subjective:    Erin [facility] relates she is still nauseated was not able to tolerate her food.  Objective:    Vitals:    11/20/23 0145 11/20/23 0200 11/20/23 0215 11/20/23 0300  BP: (!) 150/86 (!) 161/80 (!) 154/84 (!) 167/76  Pulse: 87 86 90 89  Resp:    18  Temp:      TempSrc:      SpO2: 93% 95% 95% 97%  Weight:      Height:       SpO2: 97 %  No intake or output data in the 24 hours ending 11/20/23 0656 Filed Weights   11/19/23 1005  Weight: 103 kg    Exam: General exam: In no acute distress. Respiratory system: Good air movement and clear to auscultation. Cardiovascular system: S1 & S2 heard, RRR. No JVD.  Gastrointestinal system: Abdomen is nondistended, soft and nontender.  Extremities: No pedal edema. Skin: No rashes, lesions or ulcers Psychiatry: Judgement and insight appear normal. Mood & affect appropriate.    Data Reviewed:    Labs: Basic Metabolic Panel: Recent Labs  Lab 11/19/23 1018  NA 136  K 3.3*  CL 107  CO2 19*  GLUCOSE 106*  BUN 12  CREATININE 0.61  CALCIUM 9.3   GFR Estimated Creatinine Clearance: 78.6 mL/min (by C-G formula based on SCr of 0.61 mg/dL). Liver Function Tests: Recent Labs  Lab 11/19/23 1018  AST 22  ALT 64*  ALKPHOS 71  BILITOT 0.4  PROT 8.0  ALBUMIN 3.8   Recent Labs  Lab 11/19/23 1018  LIPASE 32  No results for input(s): "AMMONIA" in the last 168 hours. Coagulation profile No results for input(s): "INR", "PROTIME" in the last 168 hours. COVID-19 Labs  No results for input(s): "DDIMER", "FERRITIN", "LDH", "CRP" in the last 72 hours.  Lab Results  Component Value Date   SARSCOV2NAA NEGATIVE 05/16/2023   SARSCOV2NAA NEGATIVE 02/02/2023   SARSCOV2NAA NEGATIVE 01/07/2023   SARSCOV2NAA NEGATIVE 12/19/2021    CBC: Recent Labs  Lab 11/19/23 1018  WBC 6.4  NEUTROABS 2.7  HGB 13.8  HCT 44.6  MCV 85.1  PLT 370   Cardiac Enzymes: No results for input(s): "CKTOTAL", "CKMB", "CKMBINDEX", "TROPONINI" in the last 168 hours. BNP (last 3 results) No results for input(s): "PROBNP" in the last 8760 hours. CBG: No results for  input(s): "GLUCAP" in the last 168 hours. D-Dimer: No results for input(s): "DDIMER" in the last 72 hours. Hgb A1c: No results for input(s): "HGBA1C" in the last 72 hours. Lipid Profile: No results for input(s): "CHOL", "HDL", "LDLCALC", "TRIG", "CHOLHDL", "LDLDIRECT" in the last 72 hours. Thyroid function studies: No results for input(s): "TSH", "T4TOTAL", "T3FREE", "THYROIDAB" in the last 72 hours.  Invalid input(s): "FREET3" Anemia work up: No results for input(s): "VITAMINB12", "FOLATE", "FERRITIN", "TIBC", "IRON", "RETICCTPCT" in the last 72 hours. Sepsis Labs: Recent Labs  Lab 11/19/23 1018  WBC 6.4   Microbiology No results found for this or any previous visit (from the past 240 hours).   Medications:    FLUoxetine  20 mg Oral Daily   pantoprazole  40 mg Oral Daily   [START ON 11/24/2023] rosuvastatin  10 mg Oral Q Mon   Continuous Infusions:  0.9 % NaCl with KCl 40 mEq / L 75 mL/hr at 11/19/23 2150      LOS: 1 day   Marinda Elk  [facility]  11/20/2023, 6:56 AM

## 2023-11-20 NOTE — Progress Notes (Signed)
[facility] Progress Note  SUBJECTIVE:   Interval history: Erin Wolf was seen and evaluated today at bedside. Spouse at bedside. Noted that her pain is same as yesterday. She has been passing flatus and mucous though no stool. She had nausea and non-bloody emesis this AM. Food is what bothers her most. No chest pain or shortness of breath.   Past Medical History:  Diagnosis Date   Anxiety    Arthritis    Chronic abdominal pain    Chronic lower back pain    Chronic nausea    Depression    Diabetes mellitus without complication (HCC)    not on meds   Diverticulosis    Dyspnea    with exertion   Frequency of urination    GERD (gastroesophageal reflux disease)    Headache    Hematuria    Hepatitis    pt denies   Hepatitis C antibody positive in blood    per pt told by pcp   History of panic attacks    History of syncope    hx recurrent syncope --- per epic domentation non-cardiac , orthostatic hypotension, anxiety, dehydration   History of uterine fibroid    HTN (hypertension), benign    Hyperlipidemia 01/03/2012   IBS (irritable bowel syndrome)    Myalgia    Sciatic nerve disease, left 2023   Seizure disorder (HCC) followed by pcp until new neurologist appr. in jan 2019   "started having them in my 20's" ,  petit mal ----  per pt last seizure one 2015   SLE (systemic lupus erythematosus) [facility])    rheumatologist-- dr Sharmon Revere (consult 11-12-2016) having work-up done   Ulcerative colitis    followed by dr Evette Cristal at [facility]   Wears glasses    Past Surgical History:  Procedure Laterality Date   ABDOMINAL HYSTERECTOMY  1988   ARTERY BIOPSY Left 06/11/2022   Procedure: LEFT BIOPSY TEMPORAL ARTERY;  Surgeon: Sheliah Hatch De Blanch, MD;  Location: [facility];  Service: General;  Laterality: Left;   CHOLECYSTECTOMY OPEN  1978   COLONOSCOPY N/A 05/31/2013   Procedure: COLONOSCOPY;  Surgeon: Graylin Shiver, MD;  Location: [facility];  Service: Endoscopy;  Laterality:  N/A;   CYSTOSCOPY/RETROGRADE/URETEROSCOPY Bilateral 07/23/2017   Procedure: CYSTOSCOPY/RETROGRADE/URETEROSCOPY;  Surgeon: Rene Paci, MD;  Location: [facility];  Service: Urology;  Laterality: Bilateral;   detached retina on right eye surgery      ESOPHAGOGASTRODUODENOSCOPY (EGD) WITH PROPOFOL N/A 05/11/2014   Procedure: ESOPHAGOGASTRODUODENOSCOPY (EGD) WITH PROPOFOL;  Surgeon: Shirley Friar, MD;  Location: [facility];  Service: Endoscopy;  Laterality: N/A;  egd first   ESOPHAGOGASTRODUODENOSCOPY (EGD) WITH PROPOFOL N/A 05/08/2020   Procedure: ESOPHAGOGASTRODUODENOSCOPY (EGD) WITH PROPOFOL;  Surgeon: Willis Modena, MD;  Location: [facility];  Service: Endoscopy;  Laterality: N/A;   EUS N/A 06/21/2016   Procedure: ESOPHAGEAL ENDOSCOPIC ULTRASOUND (EUS) RADIAL;  Surgeon: Willis Modena, MD;  Location: [facility];  Service: Endoscopy;  Laterality: N/A;   FLEXIBLE SIGMOIDOSCOPY N/A 05/11/2014   Procedure: FLEXIBLE SIGMOIDOSCOPY;  Surgeon: Shirley Friar, MD;  Location: [facility];  Service: Endoscopy;  Laterality: N/A;   LAPAROTOMY W/ BILATERAL SALPINGOOPHORECTOMY  09-26-2003   dr Marcelle Overlie at [facility]   LAPROSCOPY W/ LYSIS ADHESIONS  06-08-2003   dr Arelia Sneddon [facility]   TOTAL HIP ARTHROPLASTY Left 12/03/2022   Procedure: TOTAL HIP ARTHROPLASTY ANTERIOR APPROACH;  Surgeon: Durene Romans, MD;  Location: [facility];  Service: Orthopedics;  Laterality: Left;   TOTAL KNEE ARTHROPLASTY Left  12/19/2020   Procedure: TOTAL KNEE ARTHROPLASTY;  Surgeon: Durene Romans, MD;  Location: [facility];  Service: Orthopedics;  Laterality: Left;  70 mins   TOTAL KNEE ARTHROPLASTY Right 03/27/2021   Procedure: TOTAL KNEE ARTHROPLASTY;  Surgeon: Durene Romans, MD;  Location: [facility];  Service: Orthopedics;  Laterality: Right;  70 mins   TRANSTHORACIC ECHOCARDIOGRAM  04/29/2016   ef 60-65%,  grade 2 diastolic dysfunction/  trivial MR and TR   TUBAL LIGATION Bilateral yrs ago   UPPER ESOPHAGEAL  ENDOSCOPIC ULTRASOUND (EUS) N/A 05/08/2020   Procedure: UPPER ESOPHAGEAL ENDOSCOPIC ULTRASOUND (EUS);  Surgeon: Willis Modena, MD;  Location: [facility];  Service: Endoscopy;  Laterality: N/A;   Current Facility-Administered Medications  Medication Dose Route Frequency Provider Last Rate Last Admin   alum & mag hydroxide-simeth (MAALOX/MYLANTA) 200-200-20 MG/5ML suspension 30 mL  30 mL Oral Q4H PRN Marinda Elk, MD   30 mL at 11/20/23 1130   amLODipine (NORVASC) tablet 5 mg  5 mg Oral Daily Marinda Elk, MD   5 mg at 11/20/23 0916   FLUoxetine (PROZAC) capsule 20 mg  20 mg Oral Daily Marinda Elk, MD   20 mg at 11/20/23 0916   HYDROmorphone (DILAUDID) injection 1 mg  1 mg Intravenous Q2H PRN Marinda Elk, MD   1 mg at 11/20/23 1130   [START ON 11/21/2023] HYDROmorphone (DILAUDID) tablet 4 mg  4 mg Oral Q4H PRN Marinda Elk, MD       melatonin tablet 5 mg  5 mg Oral QHS PRN Anthoney Harada, NP   5 mg at 11/20/23 0121   ondansetron (ZOFRAN) tablet 4 mg  4 mg Oral Q6H PRN Marinda Elk, MD       Or   ondansetron [facility]) injection 4 mg  4 mg Intravenous Q6H PRN Marinda Elk, MD   4 mg at 11/19/23 2058   oxyCODONE (Oxy IR/ROXICODONE) immediate release tablet 5 mg  5 mg Oral Q4H PRN Marinda Elk, MD       pantoprazole (PROTONIX) EC tablet 40 mg  40 mg Oral BID Marinda Elk, MD       prochlorperazine (COMPAZINE) injection 5 mg  5 mg Intravenous Q6H PRN Marinda Elk, MD       [START ON 11/24/2023] rosuvastatin (CRESTOR) tablet 10 mg  10 mg Oral Q Mon Marinda Elk, MD       tiZANidine (ZANAFLEX) tablet 4 mg  4 mg Oral Q8H PRN Marinda Elk, MD       Allergies as of 11/19/2023 - Review Complete 11/19/2023  Allergen Reaction Noted   Gabapentin Other (See Comments) 11/19/2023   Latex Other (See Comments) 06/20/2016   Nsaids Other (See Comments) 06/29/2016   Penicillins Hives and Other (See Comments)  05/07/2011   Wellbutrin [bupropion hcl] Other (See Comments) 05/07/2011   Aspirin Nausea Only and Other (See Comments) 05/07/2011   Celecoxib Nausea Only 05/07/2011   Darvocet [propoxyphene n-acetaminophen] Nausea And Vomiting 05/07/2011   Tape Hives 11/09/2013   Toradol [ketorolac tromethamine] Nausea And Vomiting 03/19/2016   Tylenol [acetaminophen] Nausea Only 10/22/2014   Butalbital-apap-caffeine Other (See Comments) 01/24/2021   Ciprofloxacin Other (See Comments) 01/24/2021   Eszopiclone Swelling 01/24/2021   Other Diarrhea and Other (See Comments) 08/01/2017   Topiramate  01/24/2021   Wound dressing adhesive Hives 11/09/2013   Review of Systems:  Review of Systems  Respiratory:  Negative for shortness of breath.   Cardiovascular:  Negative for chest pain.  Gastrointestinal:  Positive for abdominal pain, nausea and vomiting.    OBJECTIVE:   Temp:  [98 F (36.7 C)-98.6 F (37 C)] 98.1 F (36.7 C) (01/30 1329) Pulse Rate:  [70-118] 79 (01/30 1329) Resp:  [16-18] 16 (01/30 1329) BP: (133-196)/(74-123) 138/74 (01/30 1329) SpO2:  [93 %-100 %] 100 % (01/30 1329) Last BM Date : 11/20/23 Physical Exam Constitutional:      General: She is not in acute distress.    Appearance: She is not ill-appearing, toxic-appearing or diaphoretic.  Cardiovascular:     Rate and Rhythm: Normal rate and regular rhythm.  Pulmonary:     Effort: No respiratory distress.     Breath sounds: Normal breath sounds.  Abdominal:     General: Bowel sounds are normal. There is no distension.     Palpations: Abdomen is soft.     Tenderness: There is abdominal tenderness (diffuse).  Neurological:     Mental Status: She is alert.     Labs: Recent Labs    11/19/23 1018 11/20/23 0708  WBC 6.4 7.2  HGB 13.8 12.5  HCT 44.6 40.3  PLT 370 304   BMET Recent Labs    11/19/23 1018 11/20/23 0708  NA 136 138  K 3.3* 4.1  CL 107 108  CO2 19* 23  GLUCOSE 106* 103*  BUN 12 14  CREATININE 0.61 0.60   CALCIUM 9.3 8.7*   LFT Recent Labs    11/20/23 0708  PROT 7.1  ALBUMIN 3.4*  AST 23  ALT 48*  ALKPHOS 62  BILITOT 0.5   PT/INR No results for input(s): "LABPROT", "INR" in the last 72 hours. Diagnostic imaging: CT ABDOMEN PELVIS W CONTRAST Result Date: 11/19/2023 CLINICAL DATA:  Abdominal pain for 3 days. Left lower quadrant abdominal pain. EXAM: CT ABDOMEN AND PELVIS WITH CONTRAST TECHNIQUE: Multidetector CT imaging of the abdomen and pelvis was performed using the standard protocol following bolus administration of intravenous contrast. RADIATION DOSE REDUCTION: This exam was performed according to the departmental dose-optimization program which includes automated exposure control, adjustment of the mA and/or kV according to patient size and/or use of iterative reconstruction technique. CONTRAST:  OMNIPAQUE IOHEXOL 300 MG/ML  SOLN COMPARISON:  08/09/2023 FINDINGS: Lower chest: Lung bases are clear. Hepatobiliary: Gallbladder has been removed. Again noted is intrahepatic and extrahepatic biliary dilatation. Biliary dilatation has decreased since the previous examination. Common bile duct measures 7 mm and previously measured 10 mm. Intrahepatic biliary dilatation is also decreased. Main portal venous system is patent. Pancreas: Stable dilatation of the main pancreatic duct measuring up to 5 mm. No acute pancreatic inflammation. Again noted is soft tissue fullness at the major papilla on image 34/2 and similar to the previous examination. Spleen: Spleen is small for size.  No focal abnormality. 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 Right adrenal normal. Normal appearance of both kidneys without hydronephrosis. No suspicious renal lesion. Normal urinary bladder. Stomach/Bowel: Normal stomach. Mildly dilated loops of small bowel in the left abdomen measuring up to 2.9 cm. No focal bowel wall thickening. Colonic diverticula without acute bowel inflammation.  Vascular/Lymphatic: No significant vascular findings are present. No enlarged abdominal or pelvic lymph nodes. Right ovarian vein is slightly prominent but similar to the previous examination. Reproductive: Status post hysterectomy. No adnexal masses. Other: Negative for free fluid.  Negative for free air. 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 Disc space narrowing at L3-L4, L4-L5 and L5-S1. No acute bone abnormality. IMPRESSION: 1. Mildly dilated loops of small bowel  in the left abdomen. Findings are nonspecific but could be related to a mild ileus. 2. Colonic diverticulosis without acute bowel inflammation. 3. Intrahepatic and extrahepatic biliary dilatation has decreased since the previous examination. Stable dilatation of the main pancreatic duct. Stable fullness at the major papilla. 4. Stable left adrenal myelolipoma. Electronically Signed   By: Richarda Overlie M.D.   On: 11/19/2023 16:35   DG Chest Portable 1 View Result Date: 11/19/2023 CLINICAL DATA:  Upper abdominal pain EXAM: PORTABLE CHEST 1 VIEW COMPARISON:  05/16/2023 FINDINGS: The heart size and mediastinal contours are within normal limits. Both lungs are clear. The visualized skeletal structures are unremarkable. IMPRESSION: No active disease. Electronically Signed   By: Charlett Nose M.D.   On: 11/19/2023 10:50   IMPRESSION: Diffuse abdominal pain, continued, suspected from #2 Abnormal CT imaging, small bowel ileus Ulcerative colitis, no current medication therapy             -Last colonoscopy in 2023 with inactive disease, no dysplasia             -Previously on Rowasa enemas Diarrhea, GI pathogen panel negative for infection Blood per rectum History hepatitis C             -Labs 01/06/2012 showed negative HCV quant Family history colon cancer in brother (diagnosed in late 20s)  PLAN: -Suspect ileus as main cause for abdominal pain, her pain is worse with oral intake -Would avoid narcotic medications as able as this will  slow motility further -Check abdominal xray in AM to monitor ileus -Recommend clear liquid diet  -Follow up fecal calprotectin  -Monitor bowel movements  -[facility] will follow   LOS: 1 day   Liliane Shi, DO [facility]

## 2023-11-20 NOTE — Progress Notes (Signed)
Mobility Specialist - Progress Note   11/20/23 1529  Mobility  Activity Ambulated independently in hallway  Level of Assistance Independent  Assistive Device None  Distance Ambulated (ft) 350 ft  Activity Response Tolerated well  Mobility Referral Yes  Mobility visit 1 Mobility  Mobility Specialist Start Time (ACUTE ONLY) 1513  Mobility Specialist Stop Time (ACUTE ONLY) 1529  Mobility Specialist Time Calculation (min) (ACUTE ONLY) 16 min   Pt received in bed and agreeable to mobility. No complaints during session. Pt to bed after session with all needs met.    [facility]

## 2023-11-21 ENCOUNTER — Inpatient Hospital Stay (HOSPITAL_COMMUNITY): Payer: Medicare HMO

## 2023-11-21 DIAGNOSIS — K567 Ileus, unspecified: Secondary | ICD-10-CM | POA: Diagnosis not present

## 2023-11-21 DIAGNOSIS — K51019 Ulcerative (chronic) pancolitis with unspecified complications: Secondary | ICD-10-CM | POA: Diagnosis not present

## 2023-11-21 MED ORDER — ONDANSETRON 4 MG PO TBDP: 4.0000 mg | ORAL_TABLET | Freq: Three times a day (TID) | ORAL | 0 refills | Status: DC | PRN

## 2023-11-21 MED ORDER — OXYCODONE HCL 5 MG PO TABS
5.0000 mg | ORAL_TABLET | ORAL | Status: DC | PRN
  Administered 2023-11-21: 10 mg via ORAL
  Administered 2023-11-21: 5 mg via ORAL
  Administered 2023-11-21: 10 mg via ORAL
  Filled 2023-11-21 (×3): qty 2

## 2023-11-21 MED ORDER — HYDROMORPHONE HCL 1 MG/ML IJ SOLN: 1.0000 mg | INTRAMUSCULAR | Status: DC | PRN

## 2023-11-21 NOTE — Discharge Summary (Signed)
Physician Discharge Summary  Erin Wolf EAV:[id number] DOB: 08/22/1956 DOA: 11/19/2023  PCP: Jamey Reas, PA-C  Admit date: 11/19/2023 Discharge date: 11/21/2023  Admitted From: Home Disposition:  home  Recommendations for Outpatient Follow-up:  Follow up with PCP in 1-2 weeks Please obtain BMP/CBC in one week   Home Health:no Equipment/Devices:None  Discharge Condition:Stable CODE STATUS:Full Diet recommendation: Heart Healthy    Brief/Interim Summary: 68 y.o. female past medical history of ulcerative colitis history of cholecystectomy, diabetes mellitus type 2 that started having abdominal pain on watery stool about 2 months prior to admission progressively got worst came into the ED because of decreased oral intake nausea and vomiting and loose stools   Discharge Diagnoses:  Principal Problem:   Ulcerative colitis (HCC) Active Problems:   HTN (hypertension), benign   Hyperlipidemia   Hypokalemia   Abdominal pain  Diffuse abdominal pain with prior blood per rectum: CT scan of the abdomen pelvis showed no acute changes, did show some questionable ileus. Her last colonoscopy was in 2023 that showed no active disease. GI was consulted recommended calprotectin and conservative management. She was tolerating her full liquid diet advance to regular diet and her bowel movements improved. Hemoglobin remained relatively stable. ESR was 38. She tolerated her diet she will follow-up with GI as an outpatient.  Ulcerative colitis: Appears to be stable follow-up with GI as an outpatient.  Essential pretension: No changes made to her medication continue current regimen.  Hypokalemia: Repleted and improved.  Hyperlipidemia: Continue statins.   Discharge Instructions  Discharge Instructions     Diet - low sodium heart healthy   Complete by: As directed    Increase activity slowly   Complete by: As directed       Allergies as of 11/21/2023        Reactions   Gabapentin Other (See Comments)   Abdominal pain   Latex Other (See Comments)   Peels skin   Nsaids Other (See Comments)   HAS COLITIS FLARES   Penicillins Hives, Other (See Comments)   Tolerated Cephalosporin Date: 12/19/20   Wellbutrin [bupropion Hcl] Other (See Comments)   Insomnia and headaches   Aspirin Nausea Only, Other (See Comments)   GI Intolerance   Celecoxib Nausea Only   Darvocet [propoxyphene N-acetaminophen] Nausea And Vomiting   Tape Hives   Toradol [ketorolac Tromethamine] Nausea And Vomiting   Tylenol [acetaminophen] Nausea Only   Butalbital-apap-caffeine Other (See Comments)   Stomach upset   Ciprofloxacin Other (See Comments)   Stomach upset   Eszopiclone Swelling   Other Diarrhea, Other (See Comments)   Spicy foods cause diarrhea due to colitis   Topiramate    stomach upset   Wound Dressing Adhesive Hives        Medication List     STOP taking these medications    HYDROmorphone 4 MG tablet Commonly known as: DILAUDID   lidocaine 4 % Commonly known as: HM Lidocaine Patch   ondansetron 4 MG tablet Commonly known as: ZOFRAN   polyethylene glycol 17 g packet Commonly known as: MIRALAX / GLYCOLAX   tiZANidine 4 MG tablet Commonly known as: ZANAFLEX       TAKE these medications    Centrum Women Tabs Take 1 tablet by mouth daily with breakfast.   FLUoxetine 20 MG capsule Commonly known as: PROZAC Take 20 mg by mouth daily.   metFORMIN 500 MG tablet Commonly known as: GLUCOPHAGE Take 500 mg by mouth daily with lunch.   Narcan 4 MG/0.1ML  Liqd nasal spray kit Generic drug: naloxone Place 1 spray into the nose once as needed (opioid overdose).   ondansetron 4 MG disintegrating tablet Commonly known as: ZOFRAN-ODT Take 1 tablet (4 mg total) by mouth every 8 (eight) hours as needed. What changed: reasons to take this   oxyCODONE 5 MG immediate release tablet Commonly known as: Roxicodone Take 1 tablet (5 mg total) by  mouth every 4 (four) hours as needed for up to 12 doses for severe pain (pain score 7-10). What changed: when to take this   SYSTANE OP Place 1 drop into both eyes 2 (two) times daily as needed (dry eyes).        Allergies  Allergen Reactions   Gabapentin Other (See Comments)    Abdominal pain   Latex Other (See Comments)    Peels skin   Nsaids Other (See Comments)    HAS COLITIS FLARES   Penicillins Hives and Other (See Comments)    Tolerated Cephalosporin Date: 12/19/20   Wellbutrin [Bupropion Hcl] Other (See Comments)    Insomnia and headaches   Aspirin Nausea Only and Other (See Comments)    GI Intolerance   Celecoxib Nausea Only   Darvocet [Propoxyphene N-Acetaminophen] Nausea And Vomiting   Tape Hives   Toradol [Ketorolac Tromethamine] Nausea And Vomiting   Tylenol [Acetaminophen] Nausea Only   Butalbital-Apap-Caffeine Other (See Comments)    Stomach upset   Ciprofloxacin Other (See Comments)    Stomach upset   Eszopiclone Swelling   Other Diarrhea and Other (See Comments)    Spicy foods cause diarrhea due to colitis   Topiramate     stomach upset   Wound Dressing Adhesive Hives    Consultations: Gastroenterology   Procedures/Studies: CT ABDOMEN PELVIS W CONTRAST Result Date: 11/19/2023 CLINICAL DATA:  Abdominal pain for 3 days. Left lower quadrant abdominal pain. EXAM: CT ABDOMEN AND PELVIS WITH CONTRAST TECHNIQUE: Multidetector CT imaging of the abdomen and pelvis was performed using the standard protocol following bolus administration of intravenous contrast. RADIATION DOSE REDUCTION: This exam was performed according to the departmental dose-optimization program which includes automated exposure control, adjustment of the mA and/or kV according to patient size and/or use of iterative reconstruction technique. CONTRAST:  OMNIPAQUE IOHEXOL 300 MG/ML  SOLN COMPARISON:  08/09/2023 FINDINGS: Lower chest: Lung bases are clear. Hepatobiliary: Gallbladder has  been removed. Again noted is intrahepatic and extrahepatic biliary dilatation. Biliary dilatation has decreased since the previous examination. Common bile duct measures 7 mm and previously measured 10 mm. Intrahepatic biliary dilatation is also decreased. Main portal venous system is patent. Pancreas: Stable dilatation of the main pancreatic duct measuring up to 5 mm. No acute pancreatic inflammation. Again noted is soft tissue fullness at the major papilla on image 34/2 and similar to the previous examination. Spleen: Spleen is small for size.  No focal abnormality. 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 Right adrenal normal. Normal appearance of both kidneys without hydronephrosis. No suspicious renal lesion. Normal urinary bladder. Stomach/Bowel: Normal stomach. Mildly dilated loops of small bowel in the left abdomen measuring up to 2.9 cm. No focal bowel wall thickening. Colonic diverticula without acute bowel inflammation. Vascular/Lymphatic: No significant vascular findings are present. No enlarged abdominal or pelvic lymph nodes. Right ovarian vein is slightly prominent but similar to the previous examination. Reproductive: Status post hysterectomy. No adnexal masses. Other: Negative for free fluid.  Negative for free air. 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 Disc space narrowing at L3-L4, L4-L5 and  L5-S1. No acute bone abnormality. IMPRESSION: 1. Mildly dilated loops of small bowel in the left abdomen. Findings are nonspecific but could be related to a mild ileus. 2. Colonic diverticulosis without acute bowel inflammation. 3. Intrahepatic and extrahepatic biliary dilatation has decreased since the previous examination. Stable dilatation of the main pancreatic duct. Stable fullness at the major papilla. 4. Stable left adrenal myelolipoma. Electronically Signed   By: Richarda Overlie M.D.   On: 11/19/2023 16:35   DG Chest Portable 1 View Result Date: 11/19/2023 CLINICAL  DATA:  Upper abdominal pain EXAM: PORTABLE CHEST 1 VIEW COMPARISON:  05/16/2023 FINDINGS: The heart size and mediastinal contours are within normal limits. Both lungs are clear. The visualized skeletal structures are unremarkable. IMPRESSION: No active disease. Electronically Signed   By: Charlett Nose M.D.   On: 11/19/2023 10:50    Subjective: No complaints, tolerating her diet abdominal pain is resolved had a bowel movement.  Discharge Exam: Vitals:   11/20/23 1945 11/21/23 0445  BP: (!) 141/70 (!) 142/72  Pulse: 79 82  Resp: 20 20  Temp: (!) 97.3 F (36.3 C) 97.9 F (36.6 C)  SpO2: 99% 96%   Vitals:   11/20/23 1204 11/20/23 1329 11/20/23 1945 11/21/23 0445  BP: 136/77 138/74 (!) 141/70 (!) 142/72  Pulse: 74 79 79 82  Resp: 16 16 20 20   Temp: 98.2 F (36.8 C) 98.1 F (36.7 C) (!) 97.3 F (36.3 C) 97.9 F (36.6 C)  TempSrc: Oral Oral Oral Oral  SpO2: 99% 100% 99% 96%  Weight:      Height:        General: Pt is alert, awake, not in acute distress Cardiovascular: RRR, S1/S2 +, no rubs, no gallops Respiratory: CTA bilaterally, no wheezing, no rhonchi Abdominal: Soft, NT, ND, bowel sounds + Extremities: no edema, no cyanosis    The results of significant diagnostics from this hospitalization (including imaging, microbiology, ancillary and laboratory) are listed below for reference.     Microbiology: Recent Results (from the past 240 hours)  Gastrointestinal Panel by PCR , Stool     Status: None   Collection Time: 11/19/23  9:34 PM   Specimen: Stool  Result Value Ref Range Status   Campylobacter species NOT DETECTED NOT DETECTED Final   Plesimonas shigelloides NOT DETECTED NOT DETECTED Final   Salmonella species NOT DETECTED NOT DETECTED Final   Yersinia enterocolitica NOT DETECTED NOT DETECTED Final   Vibrio species NOT DETECTED NOT DETECTED Final   Vibrio cholerae NOT DETECTED NOT DETECTED Final   Enteroaggregative E coli (EAEC) NOT DETECTED NOT DETECTED Final    Enteropathogenic E coli (EPEC) NOT DETECTED NOT DETECTED Final   Enterotoxigenic E coli (ETEC) NOT DETECTED NOT DETECTED Final   Shiga like toxin producing E coli (STEC) NOT DETECTED NOT DETECTED Final   Shigella/Enteroinvasive E coli (EIEC) NOT DETECTED NOT DETECTED Final   Cryptosporidium NOT DETECTED NOT DETECTED Final   Cyclospora cayetanensis NOT DETECTED NOT DETECTED Final   Entamoeba histolytica NOT DETECTED NOT DETECTED Final   Giardia lamblia NOT DETECTED NOT DETECTED Final   Adenovirus F40/41 NOT DETECTED NOT DETECTED Final   Astrovirus NOT DETECTED NOT DETECTED Final   Norovirus GI/GII NOT DETECTED NOT DETECTED Final   Rotavirus A NOT DETECTED NOT DETECTED Final   Sapovirus (I, II, IV, and V) NOT DETECTED NOT DETECTED Final    Comment: Performed at [facility], [address] [place], [place] [address]     Labs: BNP (last 3 results)  Recent Labs    05/16/23 1744  BNP 9.6   Basic Metabolic Panel: Recent Labs  Lab 11/19/23 1018 11/20/23 0708  NA 136 138  K 3.3* 4.1  CL 107 108  CO2 19* 23  GLUCOSE 106* 103*  BUN 12 14  CREATININE 0.61 0.60  CALCIUM 9.3 8.7*   Liver Function Tests: Recent Labs  Lab 11/19/23 1018 11/20/23 0708  AST 22 23  ALT 64* 48*  ALKPHOS 71 62  BILITOT 0.4 0.5  PROT 8.0 7.1  ALBUMIN 3.8 3.4*   Recent Labs  Lab 11/19/23 1018  LIPASE 32   No results for input(s): "AMMONIA" in the last 168 hours. CBC: Recent Labs  Lab 11/19/23 1018 11/20/23 0708  WBC 6.4 7.2  NEUTROABS 2.7  --   HGB 13.8 12.5  HCT 44.6 40.3  MCV 85.1 85.4  PLT 370 304   Cardiac Enzymes: No results for input(s): "CKTOTAL", "CKMB", "CKMBINDEX", "TROPONINI" in the last 168 hours. BNP: Invalid input(s): "POCBNP" CBG: No results for input(s): "GLUCAP" in the last 168 hours. D-Dimer No results for input(s): "DDIMER" in the last 72 hours. Hgb A1c No results for input(s): "HGBA1C" in the last 72 hours. Lipid Profile No results for input(s):  "CHOL", "HDL", "LDLCALC", "TRIG", "CHOLHDL", "LDLDIRECT" in the last 72 hours. Thyroid function studies No results for input(s): "TSH", "T4TOTAL", "T3FREE", "THYROIDAB" in the last 72 hours.  Invalid input(s): "FREET3" Anemia work up No results for input(s): "VITAMINB12", "FOLATE", "FERRITIN", "TIBC", "IRON", "RETICCTPCT" in the last 72 hours. Urinalysis    Component Value Date/Time   COLORURINE YELLOW 11/19/2023 1334   APPEARANCEUR HAZY (A) 11/19/2023 1334   LABSPEC 1.028 11/19/2023 1334   PHURINE 5.0 11/19/2023 1334   GLUCOSEU NEGATIVE 11/19/2023 1334   HGBUR NEGATIVE 11/19/2023 1334   BILIRUBINUR NEGATIVE 11/19/2023 1334   KETONESUR NEGATIVE 11/19/2023 1334   PROTEINUR 30 (A) 11/19/2023 1334   UROBILINOGEN 0.2 04/17/2015 0644   NITRITE NEGATIVE 11/19/2023 1334   LEUKOCYTESUR NEGATIVE 11/19/2023 1334   Sepsis Labs Recent Labs  Lab 11/19/23 1018 11/20/23 0708  WBC 6.4 7.2   Microbiology Recent Results (from the past 240 hours)  Gastrointestinal Panel by PCR , Stool     Status: None   Collection Time: 11/19/23  9:34 PM   Specimen: Stool  Result Value Ref Range Status   Campylobacter species NOT DETECTED NOT DETECTED Final   Plesimonas shigelloides NOT DETECTED NOT DETECTED Final   Salmonella species NOT DETECTED NOT DETECTED Final   Yersinia enterocolitica NOT DETECTED NOT DETECTED Final   Vibrio species NOT DETECTED NOT DETECTED Final   Vibrio cholerae NOT DETECTED NOT DETECTED Final   Enteroaggregative E coli (EAEC) NOT DETECTED NOT DETECTED Final   Enteropathogenic E coli (EPEC) NOT DETECTED NOT DETECTED Final   Enterotoxigenic E coli (ETEC) NOT DETECTED NOT DETECTED Final   Shiga like toxin producing E coli (STEC) NOT DETECTED NOT DETECTED Final   Shigella/Enteroinvasive E coli (EIEC) NOT DETECTED NOT DETECTED Final   Cryptosporidium NOT DETECTED NOT DETECTED Final   Cyclospora cayetanensis NOT DETECTED NOT DETECTED Final   Entamoeba histolytica NOT DETECTED NOT  DETECTED Final   Giardia lamblia NOT DETECTED NOT DETECTED Final   Adenovirus F40/41 NOT DETECTED NOT DETECTED Final   Astrovirus NOT DETECTED NOT DETECTED Final   Norovirus GI/GII NOT DETECTED NOT DETECTED Final   Rotavirus A NOT DETECTED NOT DETECTED Final   Sapovirus (I, II, IV, and V) NOT DETECTED NOT DETECTED Final    Comment: Performed  at [facility], [address], [place], [place] [address]     Time coordinating discharge: Over 35 minutes  SIGNED:   Marinda Elk, MD  [facility] 11/21/2023, 9:42 AM Pager   If 7PM-7AM, please contact night-coverage [email or web address] Password [email or web address]

## 2023-11-21 NOTE — Progress Notes (Signed)
Spoke with PT and talked to pt about her self care skills and mobility. Appears pt is at baseline and does not need skilled OT at this time. Will sign off. Please reorder if necessary. Tory Emerald, [place] [phone]

## 2023-11-21 NOTE — Evaluation (Signed)
Physical Therapy Evaluation Patient Details Name: Erin Wolf MRN: [id number] DOB: [birth year] Today's Date: 11/21/2023  History of Present Illness  68 y.o. female that started having abdominal pain on watery stool about 2 months prior to admission progressively got worse.  came into the ED because of decreased oral intake nausea and vomiting and loose stools. Dx of ulerative colitis. Pt with past medical history of ulcerative colitis history of cholecystectomy, diabetes mellitus type 2, SLE, B TKA, L THA.  Clinical Impression  Pt is mobilizing well, she ambulated 400' with RW without loss of balance. No assistance needed for transfers nor for bed mobility. She is at or near baseline with mobility and is ready to DC home from a PT standpoint. PT signing off.          If plan is discharge home, recommend the following:     Can travel by private vehicle        Equipment Recommendations None recommended by PT  Recommendations for Other Services       Functional Status Assessment Patient has not had a recent decline in their functional status     Precautions / Restrictions Precautions Precautions: None Restrictions Weight Bearing Restrictions Per Provider Order: No      Mobility  Bed Mobility Overal bed mobility: Independent                  Transfers Overall transfer level: Independent                      Ambulation/Gait Ambulation/Gait assistance: Independent Gait Distance (Feet): 400 Feet Assistive device: Rolling walker (2 wheels) Gait Pattern/deviations: WFL(Within Functional Limits) Gait velocity: WFL     General Gait Details: steady, no loss of balance  Stairs            Wheelchair Mobility     Tilt Bed    Modified Rankin (Stroke Patients Only)       Balance Overall balance assessment: Modified Independent                                           Pertinent Vitals/Pain Pain Assessment Pain  Assessment: No/denies pain    Home Living Family/patient expects to be discharged to:: Private residence Living Arrangements: Spouse/significant other Available Help at Discharge: Family;Available 24 hours/day   Home Access: Stairs to enter Entrance Stairs-Rails: Left;Right;Can reach both Entrance Stairs-Number of Steps: 4   Home Layout: One level Home Equipment: Grab bars - tub/shower;Rolling Walker (2 wheels);Cane - single point      Prior Function Prior Level of Function : Independent/Modified Independent             Mobility Comments: uses AD prn       Extremity/Trunk Assessment   Upper Extremity Assessment Upper Extremity Assessment: Defer to OT evaluation    Lower Extremity Assessment Lower Extremity Assessment: Overall WFL for tasks assessed    Cervical / Trunk Assessment Cervical / Trunk Assessment: Normal  Communication   Communication Communication: No apparent difficulties  Cognition Arousal: Alert Behavior During Therapy: WFL for tasks assessed/performed Overall Cognitive Status: Within Functional Limits for tasks assessed  General Comments      Exercises     Assessment/Plan    PT Assessment Patient does not need any further PT services  PT Problem List         PT Treatment Interventions      PT Goals (Current goals can be found in the Care Plan section)  Acute Rehab PT Goals Patient Stated Goal: likes to walk outside PT Goal Formulation: All assessment and education complete, DC therapy    Frequency       Co-evaluation               AM-PAC PT "6 Clicks" Mobility  Outcome Measure Help needed turning from your back to your side while in a flat bed without using bedrails?: None Help needed moving from lying on your back to sitting on the side of a flat bed without using bedrails?: None Help needed moving to and from a bed to a chair (including a wheelchair)?:  None Help needed standing up from a chair using your arms (e.g., wheelchair or bedside chair)?: None Help needed to walk in hospital room?: None Help needed climbing 3-5 steps with a railing? : None 6 Click Score: 24    End of Session Equipment Utilized During Treatment: Gait belt Activity Tolerance: Patient tolerated treatment well Patient left: Other (comment);with call bell/phone within reach (on commode) Nurse Communication: Mobility status      Time: 1610-9604 PT Time Calculation (min) (ACUTE ONLY): 17 min   Charges:   PT Evaluation $PT Eval Low Complexity: 1 Low   PT General Charges $$ ACUTE PT VISIT: 1 Visit        Tamala Ser PT 11/21/2023  Acute Rehabilitation Services  Office [phone]

## 2023-11-21 NOTE — Progress Notes (Signed)
IV noted to be visibly infiltrated with redness, swollen, and tender to the touch.  IV removed.  Pt tolerated well.  To hold compazine awaiting new PIV

## 2023-11-21 NOTE — Plan of Care (Signed)
  Problem: Education: Goal: Knowledge of General Education information will improve Description: Including pain rating scale, medication(s)/side effects and non-pharmacologic comfort measures Outcome: Adequate for Discharge   Problem: Health Behavior/Discharge Planning: Goal: Ability to manage health-related needs will improve Outcome: Adequate for Discharge   Problem: Clinical Measurements: Goal: Ability to maintain clinical measurements within normal limits will improve Outcome: Adequate for Discharge Goal: Will remain free from infection Outcome: Adequate for Discharge Goal: Diagnostic test results will improve Outcome: Adequate for Discharge Goal: Respiratory complications will improve Outcome: Adequate for Discharge Goal: Cardiovascular complication will be avoided Outcome: Adequate for Discharge   Problem: Coping: Goal: Level of anxiety will decrease Outcome: Adequate for Discharge   Problem: Nutrition: Goal: Adequate nutrition will be maintained Outcome: Adequate for Discharge   Problem: Activity: Goal: Risk for activity intolerance will decrease Outcome: Adequate for Discharge   Problem: Elimination: Goal: Will not experience complications related to bowel motility Outcome: Adequate for Discharge Goal: Will not experience complications related to urinary retention Outcome: Adequate for Discharge   Problem: Pain Managment: Goal: General experience of comfort will improve and/or be controlled Outcome: Adequate for Discharge   Problem: Safety: Goal: Ability to remain free from injury will improve Outcome: Adequate for Discharge   Problem: Skin Integrity: Goal: Risk for impaired skin integrity will decrease Outcome: Adequate for Discharge

## 2023-11-21 NOTE — Plan of Care (Signed)

## 2023-12-01 ENCOUNTER — Ambulatory Visit: Payer: Medicare HMO | Admitting: Neurology

## 2024-01-02 ENCOUNTER — Other Ambulatory Visit: Payer: Self-pay

## 2024-01-02 ENCOUNTER — Emergency Department (HOSPITAL_BASED_OUTPATIENT_CLINIC_OR_DEPARTMENT_OTHER)

## 2024-01-02 ENCOUNTER — Emergency Department (HOSPITAL_BASED_OUTPATIENT_CLINIC_OR_DEPARTMENT_OTHER)
Admission: EM | Admit: 2024-01-02 | Discharge: 2024-01-03 | Disposition: A | Discharge status: Home / Self Care | Attending: Emergency Medicine | Admitting: Emergency Medicine

## 2024-01-02 ENCOUNTER — Encounter (HOSPITAL_BASED_OUTPATIENT_CLINIC_OR_DEPARTMENT_OTHER): Payer: Self-pay | Admitting: Emergency Medicine

## 2024-01-02 DIAGNOSIS — R1031 Right lower quadrant pain: Secondary | ICD-10-CM | POA: Insufficient documentation

## 2024-01-02 DIAGNOSIS — E119 Type 2 diabetes mellitus without complications: Secondary | ICD-10-CM | POA: Diagnosis not present

## 2024-01-02 DIAGNOSIS — I10 Essential (primary) hypertension: Secondary | ICD-10-CM | POA: Insufficient documentation

## 2024-01-02 DIAGNOSIS — Z7984 Long term (current) use of oral hypoglycemic drugs: Secondary | ICD-10-CM | POA: Diagnosis not present

## 2024-01-02 LAB — CBC WITH DIFFERENTIAL/PLATELET
Abs Immature Granulocytes: 0.02 10*3/uL (ref 0.00–0.07)
Basophils Absolute: 0 10*3/uL (ref 0.0–0.1)
Basophils Relative: 1 %
Eosinophils Absolute: 0 10*3/uL (ref 0.0–0.5)
Eosinophils Relative: 0 %
HCT: 42.2 % (ref 36.0–46.0)
Hemoglobin: 13.4 g/dL (ref 12.0–15.0)
Immature Granulocytes: 1 %
Lymphocytes Relative: 45 %
Lymphs Abs: 2 10*3/uL (ref 0.7–4.0)
MCH: 26.3 pg (ref 26.0–34.0)
MCHC: 31.8 g/dL (ref 30.0–36.0)
MCV: 82.9 fL (ref 80.0–100.0)
Monocytes Absolute: 0.4 10*3/uL (ref 0.1–1.0)
Monocytes Relative: 8 %
Neutro Abs: 2 10*3/uL (ref 1.7–7.7)
Neutrophils Relative %: 45 %
Platelets: 347 10*3/uL (ref 150–400)
RBC: 5.09 MIL/uL (ref 3.87–5.11)
RDW: 14.6 % (ref 11.5–15.5)
WBC: 4.4 10*3/uL (ref 4.0–10.5)
nRBC: 0 % (ref 0.0–0.2)

## 2024-01-02 LAB — URINALYSIS, MICROSCOPIC (REFLEX)

## 2024-01-02 LAB — COMPREHENSIVE METABOLIC PANEL
ALT: 16 U/L (ref 0–44)
AST: 19 U/L (ref 15–41)
Albumin: 3.7 g/dL (ref 3.5–5.0)
Alkaline Phosphatase: 55 U/L (ref 38–126)
Anion gap: 10 (ref 5–15)
BUN: 9 mg/dL (ref 8–23)
CO2: 20 mmol/L — ABNORMAL LOW (ref 22–32)
Calcium: 9 mg/dL (ref 8.9–10.3)
Chloride: 105 mmol/L (ref 98–111)
Creatinine, Ser: 0.59 mg/dL (ref 0.44–1.00)
GFR, Estimated: >60 mL/min (ref >60)
Glucose, Bld: 108 mg/dL — ABNORMAL HIGH (ref 70–99)
Potassium: 3.3 mmol/L — ABNORMAL LOW (ref 3.5–5.1)
Sodium: 135 mmol/L (ref 135–145)
Total Bilirubin: 0.5 mg/dL (ref 0.0–1.2)
Total Protein: 8.1 g/dL (ref 6.5–8.1)

## 2024-01-02 LAB — URINALYSIS, ROUTINE W REFLEX MICROSCOPIC
Glucose, UA: NEGATIVE mg/dL
Hgb urine dipstick: NEGATIVE
Ketones, ur: NEGATIVE mg/dL
Leukocytes,Ua: NEGATIVE
Nitrite: NEGATIVE
Protein, ur: 30 mg/dL — AB
Specific Gravity, Urine: >=1.03 (ref 1.005–1.030)
pH: 6 (ref 5.0–8.0)

## 2024-01-02 LAB — LIPASE, BLOOD: Lipase: 24 U/L (ref 11–51)

## 2024-01-02 MED ORDER — ONDANSETRON HCL 4 MG/2ML IJ SOLN
4.0000 mg | Freq: Once | INTRAMUSCULAR | Status: AC
  Administered 2024-01-02: 4 mg via INTRAVENOUS
  Filled 2024-01-02: qty 2

## 2024-01-02 MED ORDER — FENTANYL CITRATE PF 50 MCG/ML IJ SOSY
50.0000 ug | PREFILLED_SYRINGE | Freq: Once | INTRAMUSCULAR | Status: AC
  Administered 2024-01-02: 50 ug via INTRAVENOUS
  Filled 2024-01-02: qty 1

## 2024-01-02 MED ORDER — ONDANSETRON 4 MG PO TBDP: 4.0000 mg | ORAL_TABLET | Freq: Three times a day (TID) | ORAL | 0 refills | Status: DC | PRN

## 2024-01-02 MED ORDER — CYCLOBENZAPRINE HCL 5 MG PO TABS
5.0000 mg | ORAL_TABLET | Freq: Once | ORAL | Status: AC
  Administered 2024-01-02: 5 mg via ORAL
  Filled 2024-01-02: qty 1

## 2024-01-02 MED ORDER — SODIUM CHLORIDE 0.9 % IV BOLUS
1000.0000 mL | Freq: Once | INTRAVENOUS | Status: AC
  Administered 2024-01-02: 1000 mL via INTRAVENOUS

## 2024-01-02 MED ORDER — MORPHINE SULFATE (PF) 4 MG/ML IV SOLN
4.0000 mg | Freq: Once | INTRAVENOUS | Status: AC
  Administered 2024-01-02: 4 mg via INTRAVENOUS
  Filled 2024-01-02: qty 1

## 2024-01-02 MED ORDER — IOHEXOL 300 MG/ML  SOLN
100.0000 mL | Freq: Once | INTRAMUSCULAR | Status: AC | PRN
  Administered 2024-01-02: 100 mL via INTRAVENOUS

## 2024-01-02 NOTE — ED Notes (Signed)
 Fall risk armband Fall risk sign on door  Fall risk socks

## 2024-01-02 NOTE — ED Notes (Signed)
 Patient transported to CT

## 2024-01-02 NOTE — ED Notes (Signed)
 Lab called and made aware of urine culture.  Will add on to urine in lab

## 2024-01-02 NOTE — ED Notes (Signed)
 Patient reports improvement in pain and nausea.  Able to tolerate PO intake prior to d/c

## 2024-01-02 NOTE — ED Provider Notes (Signed)
 [place] [facility] Provider Note   CSN: [id number] Arrival date & time: 01/02/24  1324     History  Chief Complaint  Patient presents with   Abdominal Pain   Hip Pain    Chronic   Back Pain    Chronic    Erin Wolf is a 68 y.o. female.  With an extensive history including but not limited to ulcerative colitis, lupus, chronic abdominal pain, chronic back pain, hypertension, seizure disorder, hyperlipidemia, GERD, anxiety, depression, NIDDM 2 presenting to the ED for evaluation of abdominal pain.  Pain is localized to the right lower quadrant.  She has associated nausea, vomiting and diaphoresis.  Symptoms began approximately 2 days ago.  She has frequent flares of her abdominal pain due to her history of colitis.  She states since the pain began she developed diarrhea.  She has had numerous episodes of diarrhea today.  States this looks like mucus.  No blood in the stool.  No fevers or chills.  No chest pain or shortness of breath.  Some nausea with some vomiting.  No vomiting today.  She still has her appendix.  She denies vaginal complaints.  Some frequency but no urgency or dysuria.  No hematuria.  Patient is also complaining of right hip pain.  This is a chronic issue.  States she is scheduled to have a hip replacement in the future.  No recent changes.  She used to see [facility], however her physician retired and she switched to [facility].  She has an appointment in 2 weeks.   Abdominal Pain Associated symptoms: diarrhea and nausea   Hip Pain Associated symptoms include abdominal pain.  Back Pain Associated symptoms: abdominal pain        Home Medications Prior to Admission medications   Medication Sig Start Date End Date Taking? Authorizing Provider  FLUoxetine (PROZAC) 20 MG capsule Take 20 mg by mouth daily.     [provider]  metFORMIN (GLUCOPHAGE) 500 MG tablet Take 500 mg by mouth daily with lunch.    [provider]  Multiple Vitamins-Minerals (CENTRUM WOMEN) TABS Take 1 tablet by mouth daily with breakfast.    [provider]  naloxone (NARCAN) nasal spray 4 mg/0.1 mL Place 1 spray into the nose once as needed (opioid overdose).    [provider]  ondansetron (ZOFRAN-ODT) 4 MG disintegrating tablet Take 1 tablet (4 mg total) by mouth every 8 (eight) hours as needed. 11/21/23   Marinda Elk, MD  oxyCODONE (ROXICODONE) 5 MG immediate release tablet Take 1 tablet (5 mg total) by mouth every 4 (four) hours as needed for up to 12 doses for severe pain (pain score 7-10). Patient taking differently: Take 5 mg by mouth every 4 (four) hours. 08/09/23   Glyn Ade, MD  Polyethyl Glycol-Propyl Glycol (SYSTANE OP) Place 1 drop into both eyes 2 (two) times daily as needed (dry eyes).    [provider]      Allergies    Gabapentin, Latex, Nsaids, Penicillins, Wellbutrin [bupropion hcl], Aspirin, Celecoxib, Darvocet [propoxyphene n-acetaminophen], Tape, Toradol [ketorolac tromethamine], Tylenol [acetaminophen], Butalbital-apap-caffeine, Ciprofloxacin, Eszopiclone, Other, Topiramate, and Wound dressing adhesive    Review of Systems   Review of Systems  Gastrointestinal:  Positive for abdominal pain, diarrhea and nausea.  Musculoskeletal:  Positive for back pain.  All other systems reviewed and are negative.   Physical Exam Updated Vital Signs BP (!) 153/97   Pulse 86  Temp 98.2 F (36.8 C)   Resp 18   Ht 5\' 4"  (1.626 m)   Wt 102.1 kg   SpO2 100%   BMI 38.62 kg/m  Physical Exam Vitals and nursing note reviewed.  Constitutional:      General: She is not in acute distress.    Appearance: Normal appearance. She is obese. She is not ill-appearing.     Comments: Resting comfortably in bed  HENT:     Head: Normocephalic and atraumatic.  Pulmonary:     Effort: Pulmonary effort is normal. No respiratory distress.  Abdominal:     General: Abdomen is  flat.     Tenderness: There is abdominal tenderness in the right lower quadrant. There is no right CVA tenderness, left CVA tenderness or guarding. Positive signs include McBurney's sign.  Musculoskeletal:        General: Normal range of motion.     Cervical back: Neck supple.  Skin:    General: Skin is warm and dry.  Neurological:     Mental Status: She is alert and oriented to person, place, and time.  Psychiatric:        Mood and Affect: Mood normal.        Behavior: Behavior normal.     ED Results / Procedures / Treatments   Labs (all labs ordered are listed, but only abnormal results are displayed) Labs Reviewed  COMPREHENSIVE METABOLIC PANEL - Abnormal; Notable for the following components:      Result Value   Potassium 3.3 (*)    CO2 20 (*)    Glucose, Bld 108 (*)    All other components within normal limits  URINALYSIS, ROUTINE W REFLEX MICROSCOPIC - Abnormal; Notable for the following components:   APPearance CLOUDY (*)    Bilirubin Urine SMALL (*)    Protein, ur 30 (*)    All other components within normal limits  URINALYSIS, MICROSCOPIC (REFLEX) - Abnormal; Notable for the following components:   Bacteria, UA FEW (*)    All other components within normal limits  LIPASE, BLOOD  CBC WITH DIFFERENTIAL/PLATELET    EKG None  Radiology No results found.  Procedures Procedures    Medications Ordered in ED Medications  fentaNYL (SUBLIMAZE) injection 50 mcg (has no administration in time range)  sodium chloride 0.9 % bolus 1,000 mL (0 mLs Intravenous Stopped 01/02/24 1834)  ondansetron (ZOFRAN) injection 4 mg (4 mg Intravenous Given 01/02/24 1721)  fentaNYL (SUBLIMAZE) injection 50 mcg (50 mcg Intravenous Given 01/02/24 1721)  iohexol (OMNIPAQUE) 300 MG/ML solution 100 mL (100 mLs Intravenous Contrast Given 01/02/24 1836)    ED Course/ Medical Decision Making/ A&P                                 Medical Decision Making Amount and/or Complexity of Data  Reviewed Labs: ordered. Radiology: ordered.  Risk Prescription drug management.  This patient presents to the ED for concern of abdominal pain, this involves an extensive number of treatment options, and is a complaint that carries with it a high risk of complications and morbidity. Differential diagnosis of her lower abdominal considerations include pelvic inflammatory disease, ectopic pregnancy, appendicitis, urinary calculi, primary dysmenorrhea, septic abortion, ruptured ovarian cyst or tumor, ovarian torsion, tubo-ovarian abscess, degeneration of fibroid, endometriosis, diverticulitis, cystitis.   My initial workup includes labs, imaging, symptom control  Additional history obtained from: Nursing notes from this visit. Previous records within EMR system  ED to hospital admission on 11/19/2023.  GI at that time recommended conservative managements for her ileus.  I ordered, reviewed and interpreted labs which include: CBC, CMP, lipase, urinalysis.  No leukocytosis or anemia.  Hypokalemia 3.3, bicarb decreased to 20, hyperglycemia 108.  No kidney dysfunction or LFT abnormality.  Lipase negative.  Urine without evidence of infection.  I ordered imaging studies including CT abdomen pelvis I independently visualized and interpreted imaging which showed pending at shift change  Afebrile, hemodynamically stable.  68 year old female presents to the ED for evaluation of right lower quadrant abdominal pain.  She still has her appendix.  Pain began 2 days ago.  Has an extensive history of chronic abdominal pain.  She follows with GI for this.  She has had poor oral intake over the past 2 days.  No fevers.  She has frequent diarrhea which is typical for her flares of colitis.  She appears very well on physical exam.  Moderate amount of abdominal tenderness without rigidity.  Some urinary frequency, however her urine shows no evidence of infection.  She was treated symptomatically here in the emergency  department.  Another CT of the abdomen and pelvis was obtained due to location of her symptoms.  This is pending at shift change.  Plan at the time of shift change is to reassess after imaging.  If imaging is negative and she can tolerate oral intake, would likely be safe for discharge home.  Suspect we likely will not fix her chronic abdominal pain at this time.  On chart review, it is also noted that GI suggested decreasing opioids for her abdominal pain.  Care handed off to oncoming provider at shift change.  Please see their note for final disposition and decision making.  Plan may change at the discretion of the oncoming provider.  Note: Portions of this report may have been transcribed using voice recognition software. Every effort was made to ensure accuracy; however, inadvertent computerized transcription errors may still be present.        Final Clinical Impression(s) / ED Diagnoses Final diagnoses:  RLQ abdominal pain    Rx / DC Orders ED Discharge Orders     None         Michelle Piper, PA-C 01/02/24 1845    Vanetta Mulders, MD 01/03/24 [phone]

## 2024-01-02 NOTE — ED Triage Notes (Signed)
 RLQ abdominal pain associated with n/v/d x 2 days. Denies fevers. Describes pain as "cramping", constant. Pain worse when having a BM.   Also reports right hip pain and right lower back pain. States "I need a hip replacement on that side"

## 2024-01-02 NOTE — Discharge Instructions (Addendum)
 You were seen here in the ER today for evaluation of you abdominal pain. Your imaging shows chronic findings. Please make sure to follow up with your primary care provider and GI provider for surveillance labs regarding this. Additionally, please follow up with you GI provider ASAP regarding your symptoms today. Make sure that you are staying well hydrated, drinking plenty of fluids, mainly water. I am also sending you home with a few nausea medications to take as needed as well. If you have any concerns, new or worsening symptoms, please return to the ER for re-evaluation.   Contact a doctor if: Your belly pain changes or gets worse. You have very bad cramping or bloating in your belly. You vomit. Your pain gets worse with meals, after eating, or with certain foods. You have trouble pooping or have watery poop for more than 2-3 days. You are not hungry, or you lose weight without trying. You have signs of not getting enough fluid or water (dehydration). These may include: Dark pee, very little pee, or no pee. Cracked lips or dry mouth. Feeling sleepy or weak. You have pain when you pee or poop. Your belly pain wakes you up at night. You have blood in your pee. You have a fever. Get help right away if: You cannot stop vomiting. Your pain is only in one part of your belly, like on the right side. You have bloody or black poop, or poop that looks like tar. You have trouble breathing. You have chest pain. These symptoms may be an emergency. Get help right away. Call 911. Do not wait to see if the symptoms will go away. Do not drive yourself to the hospital.

## 2024-01-02 NOTE — ED Provider Notes (Signed)
 Physical Exam  BP (!) 143/80   Pulse 84   Temp 98.2 F (36.8 C)   Resp 16   Ht 5\' 4"  (1.626 m)   Wt 102.1 kg   SpO2 100%   BMI 38.62 kg/m   Physical Exam Vitals and nursing note reviewed.  Constitutional:      General: She is not in acute distress.    Appearance: She is not toxic-appearing.  Eyes:     General: No scleral icterus. Pulmonary:     Effort: Pulmonary effort is normal. No respiratory distress.  Abdominal:     General: There is no distension.     Palpations: Abdomen is soft.     Tenderness: There is abdominal tenderness in the right lower quadrant. There is no guarding or rebound.  Skin:    General: Skin is warm and dry.  Neurological:     Mental Status: She is alert.     Procedures  Procedures  ED Course / MDM    Medical Decision Making Amount and/or Complexity of Data Reviewed Labs: ordered. Radiology: ordered.  Risk Prescription drug management.   Accepted handoff at shift change from [facility], PA-C. Please see prior provider note for more detail.   Briefly: Patient is 68 y.o. female presenting to the emergency room today for evaluation of right lower quadrant pain.  Patient does have a history of ulcerative colitis.  Plan is for following up with CT imaging to rule out appendicitis versus colitis versus other intra-abdominal etiology.  On evaluation, patient is coming about some right hip pain.  Reports that she just have the right hip replaced.  Requesting muscle relaxer.  Reports that she usually takes Flexeril.  1 dose of Flexeril ordered.  CT imaging shows 1. No acute findings in the abdomen pelvis. 2. 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 3. Chronic myelolipoma of the adrenal glands. Per radiologist's interpretation.    Patient does not appear in any acute distress.  I given her some pain medicine and nausea medication per her request.  I think she is likely having more hip pain and she is abdominal pain.   Question of this is any referred pain.  Abdomen is soft without any guarding or rebound.  She does have slightly decreased potassium.  Urine is cloudy with 6 few bacteria.  Having some urinary frequency however no dysuria.  I have already ordered on a urine culture for this.  Patient likely may be having some gas pain or diarrheal illness.  There is no white count.  No fever or tachycardia.  Blood pressure is mildly elevated.  Patient well-appearing.  She has good GI follow-up and reports that she has a primary care appointment next week.  Recommend she follow with her primary care doctor and her GI provider as well as the orthopedic provider given her hip pain has been continuing.     We reviewed labs and imaging together.  We discussed return precautions and red flag symptoms.  She verbalized understanding and agrees to the plan.  She stable for discharge home with close outpatient follow-up.    CT ABDOMEN PELVIS W CONTRAST Result Date: 01/02/2024 CLINICAL DATA:  RIGHT lower quadrant pain EXAM: CT ABDOMEN AND PELVIS WITH CONTRAST TECHNIQUE: Multidetector CT imaging of the abdomen and pelvis was performed using the standard protocol following bolus administration of intravenous contrast. RADIATION DOSE REDUCTION: This exam was performed according to the departmental dose-optimization program which includes automated exposure control, adjustment of the  mA and/or kV according to patient size and/or use of iterative reconstruction technique. CONTRAST:  OMNIPAQUE IOHEXOL 300 MG/ML  SOLN COMPARISON:  CT 08/09/2023, 11/19/2023 FINDINGS: Lower chest: Lung bases are clear. Hepatobiliary: No focal hepatic lesion. The common hepatic duct and common bile duct are prominent. The common bile duct measures 7 mm in the pancreatic head. Patient status post cholecystectomy. Pancreas: Pancreatic duct is prominent measuring 4 mm in diameter. Duct dilatation extends the length of the pancreas. The common bile duct and  pancreatic duct dilatation pattern is not changed from CT 11/19/2023 or CT 08/09/2023. In fact,, the biliary duct dilatation is improved from 08/09/2023. Spleen: Normal spleen Adrenals/urinary tract: Small myelolipoma of the RIGHT LEFT adrenal glands unchanged. Kidneys ureters and bladder normal. Stomach/Bowel: The stomach, duodenum, and small bowel normal. Insert LEFT tics Vascular/Lymphatic: Abdominal aorta is normal caliber. No periportal or retroperitoneal adenopathy. No pelvic adenopathy. Reproductive: Post hysterectomy.  Adnexa unremarkable Other: No free fluid. Musculoskeletal: No aggressive osseous lesion. IMPRESSION: 1. No acute findings in the abdomen pelvis. 2. 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 3. Chronic myelolipoma of the adrenal glands. Electronically Signed   By: Genevive Bi M.D.   On: 01/02/2024 20:29          Achille Rich, PA-C 01/03/24 0009    Vanetta Mulders, MD 01/03/24 [phone]

## 2024-01-02 NOTE — ED Notes (Signed)
Patient provided with crackers and ginger ale for PO challenge 

## 2024-01-04 LAB — URINE CULTURE

## 2024-03-28 ENCOUNTER — Other Ambulatory Visit: Payer: Self-pay

## 2024-03-28 ENCOUNTER — Encounter (HOSPITAL_COMMUNITY): Payer: Self-pay

## 2024-03-28 ENCOUNTER — Emergency Department (HOSPITAL_COMMUNITY)
Admission: EM | Admit: 2024-03-28 | Discharge: 2024-03-28 | Disposition: A | Discharge status: Home / Self Care | Attending: Emergency Medicine | Admitting: Emergency Medicine

## 2024-03-28 DIAGNOSIS — E876 Hypokalemia: Secondary | ICD-10-CM | POA: Diagnosis not present

## 2024-03-28 DIAGNOSIS — I10 Essential (primary) hypertension: Secondary | ICD-10-CM | POA: Insufficient documentation

## 2024-03-28 DIAGNOSIS — K5732 Diverticulitis of large intestine without perforation or abscess without bleeding: Secondary | ICD-10-CM | POA: Diagnosis not present

## 2024-03-28 DIAGNOSIS — R1084 Generalized abdominal pain: Secondary | ICD-10-CM | POA: Insufficient documentation

## 2024-03-28 DIAGNOSIS — R112 Nausea with vomiting, unspecified: Secondary | ICD-10-CM | POA: Insufficient documentation

## 2024-03-28 DIAGNOSIS — R739 Hyperglycemia, unspecified: Secondary | ICD-10-CM | POA: Insufficient documentation

## 2024-03-28 DIAGNOSIS — Z91040 Latex allergy status: Secondary | ICD-10-CM | POA: Insufficient documentation

## 2024-03-28 DIAGNOSIS — R197 Diarrhea, unspecified: Secondary | ICD-10-CM | POA: Insufficient documentation

## 2024-03-28 DIAGNOSIS — R110 Nausea: Secondary | ICD-10-CM

## 2024-03-28 LAB — URINALYSIS, ROUTINE W REFLEX MICROSCOPIC
Bilirubin Urine: NEGATIVE
Glucose, UA: 50 mg/dL — AB
Hgb urine dipstick: NEGATIVE
Ketones, ur: 5 mg/dL — AB
Leukocytes,Ua: NEGATIVE
Nitrite: NEGATIVE
Protein, ur: 30 mg/dL — AB
Specific Gravity, Urine: 1.025 (ref 1.005–1.030)
pH: 5 (ref 5.0–8.0)

## 2024-03-28 LAB — COMPREHENSIVE METABOLIC PANEL WITH GFR
ALT: 17 U/L (ref 0–44)
AST: 20 U/L (ref 15–41)
Albumin: 3.8 g/dL (ref 3.5–5.0)
Alkaline Phosphatase: 63 U/L (ref 38–126)
Anion gap: 10 (ref 5–15)
BUN: 11 mg/dL (ref 8–23)
CO2: 23 mmol/L (ref 22–32)
Calcium: 9.2 mg/dL (ref 8.9–10.3)
Chloride: 104 mmol/L (ref 98–111)
Creatinine, Ser: 0.6 mg/dL (ref 0.44–1.00)
GFR, Estimated: >60 mL/min (ref >60)
Glucose, Bld: 123 mg/dL — ABNORMAL HIGH (ref 70–99)
Potassium: 3.5 mmol/L (ref 3.5–5.1)
Sodium: 137 mmol/L (ref 135–145)
Total Bilirubin: 0.5 mg/dL (ref 0.0–1.2)
Total Protein: 7.9 g/dL (ref 6.5–8.1)

## 2024-03-28 LAB — CBC
HCT: 43.6 % (ref 36.0–46.0)
Hemoglobin: 13.7 g/dL (ref 12.0–15.0)
MCH: 26.2 pg (ref 26.0–34.0)
MCHC: 31.4 g/dL (ref 30.0–36.0)
MCV: 83.5 fL (ref 80.0–100.0)
Platelets: 302 10*3/uL (ref 150–400)
RBC: 5.22 MIL/uL — ABNORMAL HIGH (ref 3.87–5.11)
RDW: 14.7 % (ref 11.5–15.5)
WBC: 5.3 10*3/uL (ref 4.0–10.5)
nRBC: 0 % (ref 0.0–0.2)

## 2024-03-28 LAB — LIPASE, BLOOD: Lipase: 29 U/L (ref 11–51)

## 2024-03-28 MED ORDER — ONDANSETRON HCL 4 MG/2ML IJ SOLN
4.0000 mg | Freq: Once | INTRAMUSCULAR | Status: AC
  Administered 2024-03-28: 4 mg via INTRAVENOUS
  Filled 2024-03-28: qty 2

## 2024-03-28 MED ORDER — SODIUM CHLORIDE 0.9 % IV BOLUS
500.0000 mL | Freq: Once | INTRAVENOUS | Status: AC
  Administered 2024-03-28: 500 mL via INTRAVENOUS

## 2024-03-28 MED ORDER — HYDROMORPHONE HCL 1 MG/ML IJ SOLN
1.0000 mg | Freq: Once | INTRAMUSCULAR | Status: AC
  Administered 2024-03-28: 1 mg via INTRAVENOUS
  Filled 2024-03-28: qty 1

## 2024-03-28 NOTE — ED Provider Notes (Signed)
 [place] [facility]PARTMENT AT [facility] Provider Note   CSN: [id number] Arrival date & time: 03/28/24  1191     History  Chief Complaint  Patient presents with   Abdominal Pain   Emesis   Diarrhea    Erin Wolf is a 68 y.o. female who presents to the emergency department with a chief complaint of lower abdominal pain, nausea, vomiting, and diarrhea since this past Friday.  Patient is a past medical history significant for hypertension, seizure, depression, ulcerative colitis, hepatitis C, GERD.  Denies hematemesis, denies chest pain, denies shortness of breath, denies hematochezia.   Abdominal Pain Associated symptoms: diarrhea and vomiting   Emesis Associated symptoms: abdominal pain and diarrhea   Diarrhea Associated symptoms: abdominal pain and vomiting        Home Medications Prior to Admission medications   Medication Sig Start Date End Date Taking? Authorizing Provider  FLUoxetine  (PROZAC ) 20 MG capsule Take 20 mg by mouth daily.     [provider]  metFORMIN (GLUCOPHAGE) 500 MG tablet Take 500 mg by mouth daily with lunch.    [provider]  Multiple Vitamins-Minerals (CENTRUM WOMEN) TABS Take 1 tablet by mouth daily with breakfast.    [provider]  naloxone (NARCAN) nasal spray 4 mg/0.1 mL Place 1 spray into the nose once as needed (opioid overdose).    [provider]  ondansetron  (ZOFRAN -ODT) 4 MG disintegrating tablet Take 1 tablet (4 mg total) by mouth every 8 (eight) hours as needed. 01/02/24   Spence Dux, PA-C  oxyCODONE  (ROXICODONE ) 5 MG immediate release tablet Take 1 tablet (5 mg total) by mouth every 4 (four) hours as needed for up to 12 doses for severe pain (pain score 7-10). Patient taking differently: Take 5 mg by mouth every 4 (four) hours. 08/09/23   Countryman, Chase, MD  Polyethyl Glycol-Propyl Glycol (SYSTANE OP) Place 1 drop into both eyes 2 (two) times daily as needed (dry eyes).     [provider]      Allergies    Gabapentin, Latex, Nsaids, Penicillins, Wellbutrin [bupropion hcl], Aspirin , Celecoxib, Darvocet [propoxyphene n-acetaminophen ], Tape, Toradol  [ketorolac  tromethamine ], Tylenol  [acetaminophen ], Butalbital-apap-caffeine, Ciprofloxacin , Eszopiclone, Other, Topiramate, and Wound dressing adhesive    Review of Systems   Review of Systems  Gastrointestinal:  Positive for abdominal pain, diarrhea and vomiting.    Physical Exam Updated Vital Signs BP 135/88 (BP Location: Left Arm)   Pulse 78   Temp 97.8 F (36.6 C) (Oral)   Resp 18   SpO2 100%  Physical Exam Vitals and nursing note reviewed.  Constitutional:      General: She is awake. She is not in acute distress.    Appearance: She is not ill-appearing, toxic-appearing or diaphoretic.  HENT:     Head: Normocephalic and atraumatic.  Eyes:     General: No scleral icterus. Cardiovascular:     Rate and Rhythm: Normal rate and regular rhythm.     Heart sounds: Normal heart sounds.  Pulmonary:     Effort: Pulmonary effort is normal.     Breath sounds: Normal breath sounds.  Abdominal:     General: Abdomen is flat. There is no distension.     Palpations: Abdomen is soft. There is no mass.     Tenderness: There is generalized abdominal tenderness and tenderness in the right upper quadrant and epigastric area. There is no right CVA tenderness, left CVA tenderness, guarding or rebound. Negative signs include Murphy's sign.  Skin:  General: Skin is warm.     Capillary Refill: Capillary refill takes less than 2 seconds.  Neurological:     General: No focal deficit present.     Mental Status: She is alert and oriented to person, place, and time.     Cranial Nerves: No cranial nerve deficit.     Motor: No weakness.  Psychiatric:        Mood and Affect: Mood normal.        Behavior: Behavior normal. Behavior is cooperative.     ED Results / Procedures / Treatments   Labs (all labs ordered  are listed, but only abnormal results are displayed) Labs Reviewed  COMPREHENSIVE METABOLIC PANEL WITH GFR - Abnormal; Notable for the following components:      Result Value   Glucose, Bld 123 (*)    All other components within normal limits  CBC - Abnormal; Notable for the following components:   RBC 5.22 (*)    All other components within normal limits  URINALYSIS, ROUTINE W REFLEX MICROSCOPIC - Abnormal; Notable for the following components:   APPearance HAZY (*)    Glucose, UA 50 (*)    Ketones, ur 5 (*)    Protein, ur 30 (*)    Bacteria, UA RARE (*)    All other components within normal limits  LIPASE, BLOOD    EKG None  Radiology No results found.  Procedures Procedures    Medications Ordered in ED Medications  ondansetron  (ZOFRAN ) injection 4 mg (4 mg Intravenous Given 03/28/24 1104)  HYDROmorphone  (DILAUDID ) injection 1 mg (1 mg Intravenous Given 03/28/24 1104)  sodium chloride  0.9 % bolus 500 mL (0 mLs Intravenous Stopped 03/28/24 1352)  HYDROmorphone  (DILAUDID ) injection 1 mg (1 mg Intravenous Given 03/28/24 1351)    ED Course/ Medical Decision Making/ A&P Clinical Course as of 03/28/24 2006  Sun Mar 28, 2024  2004 Mucus: PRESENT [CH]    Clinical Course User Index [CH] Fonda Hymen, PA-C    Patient presents to the ED for concern of lower abdominal pain, nausea, vomiting, diarrhea, this involves an extensive number of treatment options, and is a complaint that carries with it a high risk of complications and morbidity.  The differential diagnosis includes gastroenteritis, cholecystitis, choledocholithiasis, appendicitis, diverticulitis, pancreatitis, urinary tract infection, stomach ulcer, etc.   Co morbidities that complicate the patient evaluation  Chronic ulcerative colitis   Additional history obtained:  Additional history obtained from previous emergency department visit    External records from outside source obtained and reviewed including prior  CT scan on 01/02/2024   Lab Tests:  I Ordered, and personally interpreted labs.  The pertinent results include: CBC-unremarkable, CMP-unremarkable other than mildly elevated glucose, lipase- unremarkable, UA significant for rare bacteria, 30 protein, 5 ketones, 50 glucose, hazy appearance Patient denies any urinary symptoms today    Cardiac Monitoring:  The patient was maintained on a cardiac monitor.  I personally viewed and interpreted the cardiac monitored which showed an underlying rhythm of: sinus   Medicines ordered and prescription drug management:  I ordered medication including Zofran  for nausea, fluids for suspected dehydration, Dilaudid  for pain Reevaluation of the patient after these medicines showed that the patient improved I have reviewed the patients home medicines and have made adjustments as needed   Test Considered:  RUQ ultrasound/CT abdomen/pelvis: Declined at this time, per chart review patient seems to have chronic GI issues including ulcerative colitis, patient states that she has had a, multiple times strictly for pain  control.  Patient recently had a negative CT abdomen/pelvis and a previous emergency department visit.  Labs overall today very reassuring.   Critical Interventions:  none   Problem List / ED Course:  Patient presents to the emergency department for chief complaint of lower abdominal pain, nausea, vomiting, diarrhea Patient has history significant for chronic ulcerative colitis Abdominal pain workup started Abdominal pain workup today largely unremarkable, no elevated lipase, CMP unremarkable, CBC unremarkable, UA unremarkable-patient has no urinary symptoms at this time Patient states pain is epigastric and right upper quadrant area Pain relief given with 2 doses of Dilaudid , fluids given, nausea meds given CT scan on 01/02/2024 showed no acute findings in abdomen pelvis, chronic dilation of the, evaluated by cardiology, chronic myelo  lipoma of the glands -declined imaging today due to patient stating this is consistent with her chronic GI pain, overall lab abdominal pain workup reassuring UA significant for rare bacteria, however nitrite negative, negative leuks, patient currently denies urinary symptoms.  Declined to treat at this time. Return precautions given Patient discharged with recommended follow-up to her GI specialist as well as PCP, preferably within 48 hours.   Reevaluation:  After the interventions noted above, I reevaluated the patient and found that they have :improved   Social Determinants of Health:  none   Dispostion:  After consideration of the diagnostic results and the patients response to treatment, I feel that the patent would benefit from discharge and outpatient follow-up with GI as scheduled for ongoing diagnosis and treatment.  Click here for ABCD2, HEART and other calculatorsREFRESH Note before signing :1}                              Medical Decision Making Amount and/or Complexity of Data Reviewed Labs: ordered.  Risk Prescription drug management.         Final Clinical Impression(s) / ED Diagnoses Final diagnoses:  Generalized abdominal pain  Nausea    Rx / DC Orders ED Discharge Orders     None         Susanne Epps 03/28/24 2007    Lind Repine, MD 03/30/24 1250

## 2024-03-28 NOTE — Discharge Instructions (Addendum)
 It was a pleasure taking care of you today.  Based off your history, physical exam, and labs today your overall workup was reassuring.  Due to you having a history of chronic GI pain, I recommend following up with your GI doctor and team as soon as possible for ongoing diagnosis and treatment.  Due to you having a previous negative CT scan recently of your abdomen we did not do reimaging today.  Please continue your outpatient medications for pain as prescribed.  Please return the emergency department or seek further medical care if experiencing the following symptoms included but not limited to chest pain, shortness of breath, inability to tolerate food/fluids, severe pain.  Recommend following up with GI/primary care within 48 hours for ongoing diagnosis and treatment as well as a post-discharge visit.

## 2024-03-28 NOTE — ED Notes (Signed)
 Pt report little pain relief with pain meds 8/10. Pt still c/o nausea

## 2024-03-28 NOTE — ED Triage Notes (Signed)
 PT arrives via POV. PT reports lower abdominal pain, nausea, vomiting, and diarrhea since this past Friday.

## 2024-03-28 NOTE — ED Provider Notes (Signed)
 I provided a substantive portion of the care of this patient.  I personally made/approved the management plan for this patient and take responsibility for the patient management.      Patient here with chronic abdominal discomfort.  Labs here are reassuring.  Medicated for pain feels better.  Heart rate has improved.  Will discharge home   Lind Repine, MD 03/28/24 1406

## 2024-03-30 ENCOUNTER — Other Ambulatory Visit: Payer: Self-pay

## 2024-03-30 ENCOUNTER — Emergency Department (HOSPITAL_BASED_OUTPATIENT_CLINIC_OR_DEPARTMENT_OTHER)

## 2024-03-30 ENCOUNTER — Encounter (HOSPITAL_BASED_OUTPATIENT_CLINIC_OR_DEPARTMENT_OTHER): Payer: Self-pay

## 2024-03-30 ENCOUNTER — Inpatient Hospital Stay (HOSPITAL_BASED_OUTPATIENT_CLINIC_OR_DEPARTMENT_OTHER)
Admission: EM | Admit: 2024-03-30 | Discharge: 2024-04-01 | DRG: 392 | Disposition: A | Discharge status: Home / Self Care | Attending: Internal Medicine | Admitting: Internal Medicine

## 2024-03-30 DIAGNOSIS — K51911 Ulcerative colitis, unspecified with rectal bleeding: Secondary | ICD-10-CM

## 2024-03-30 DIAGNOSIS — K5732 Diverticulitis of large intestine without perforation or abscess without bleeding: Principal | ICD-10-CM | POA: Diagnosis present

## 2024-03-30 DIAGNOSIS — Z7984 Long term (current) use of oral hypoglycemic drugs: Secondary | ICD-10-CM

## 2024-03-30 DIAGNOSIS — Z880 Allergy status to penicillin: Secondary | ICD-10-CM

## 2024-03-30 DIAGNOSIS — Z96642 Presence of left artificial hip joint: Secondary | ICD-10-CM | POA: Diagnosis present

## 2024-03-30 DIAGNOSIS — E876 Hypokalemia: Secondary | ICD-10-CM | POA: Diagnosis present

## 2024-03-30 DIAGNOSIS — E860 Dehydration: Secondary | ICD-10-CM | POA: Diagnosis present

## 2024-03-30 DIAGNOSIS — K5190 Ulcerative colitis, unspecified, without complications: Secondary | ICD-10-CM | POA: Diagnosis present

## 2024-03-30 DIAGNOSIS — R197 Diarrhea, unspecified: Secondary | ICD-10-CM

## 2024-03-30 DIAGNOSIS — K5792 Diverticulitis of intestine, part unspecified, without perforation or abscess without bleeding: Principal | ICD-10-CM | POA: Diagnosis present

## 2024-03-30 DIAGNOSIS — Z604 Social exclusion and rejection: Secondary | ICD-10-CM | POA: Diagnosis present

## 2024-03-30 DIAGNOSIS — Z7985 Long-term (current) use of injectable non-insulin antidiabetic drugs: Secondary | ICD-10-CM

## 2024-03-30 DIAGNOSIS — Z8249 Family history of ischemic heart disease and other diseases of the circulatory system: Secondary | ICD-10-CM

## 2024-03-30 DIAGNOSIS — K219 Gastro-esophageal reflux disease without esophagitis: Secondary | ICD-10-CM | POA: Diagnosis present

## 2024-03-30 DIAGNOSIS — Z91040 Latex allergy status: Secondary | ICD-10-CM

## 2024-03-30 DIAGNOSIS — R109 Unspecified abdominal pain: Secondary | ICD-10-CM

## 2024-03-30 DIAGNOSIS — G40909 Epilepsy, unspecified, not intractable, without status epilepticus: Secondary | ICD-10-CM | POA: Diagnosis present

## 2024-03-30 DIAGNOSIS — Z91048 Other nonmedicinal substance allergy status: Secondary | ICD-10-CM

## 2024-03-30 DIAGNOSIS — E871 Hypo-osmolality and hyponatremia: Secondary | ICD-10-CM | POA: Diagnosis not present

## 2024-03-30 DIAGNOSIS — Z79899 Other long term (current) drug therapy: Secondary | ICD-10-CM

## 2024-03-30 DIAGNOSIS — K922 Gastrointestinal hemorrhage, unspecified: Secondary | ICD-10-CM | POA: Diagnosis present

## 2024-03-30 DIAGNOSIS — R112 Nausea with vomiting, unspecified: Secondary | ICD-10-CM | POA: Diagnosis present

## 2024-03-30 DIAGNOSIS — Z820 Family history of epilepsy and other diseases of the nervous system: Secondary | ICD-10-CM

## 2024-03-30 DIAGNOSIS — I10 Essential (primary) hypertension: Secondary | ICD-10-CM | POA: Diagnosis present

## 2024-03-30 DIAGNOSIS — Z888 Allergy status to other drugs, medicaments and biological substances status: Secondary | ICD-10-CM

## 2024-03-30 DIAGNOSIS — E785 Hyperlipidemia, unspecified: Secondary | ICD-10-CM | POA: Diagnosis present

## 2024-03-30 DIAGNOSIS — G8929 Other chronic pain: Secondary | ICD-10-CM | POA: Diagnosis present

## 2024-03-30 DIAGNOSIS — B1920 Unspecified viral hepatitis C without hepatic coma: Secondary | ICD-10-CM | POA: Diagnosis present

## 2024-03-30 DIAGNOSIS — E8721 Acute metabolic acidosis: Secondary | ICD-10-CM | POA: Diagnosis not present

## 2024-03-30 DIAGNOSIS — K51919 Ulcerative colitis, unspecified with unspecified complications: Secondary | ICD-10-CM

## 2024-03-30 DIAGNOSIS — Z886 Allergy status to analgesic agent status: Secondary | ICD-10-CM

## 2024-03-30 DIAGNOSIS — M329 Systemic lupus erythematosus, unspecified: Secondary | ICD-10-CM | POA: Diagnosis present

## 2024-03-30 DIAGNOSIS — F1120 Opioid dependence, uncomplicated: Secondary | ICD-10-CM | POA: Diagnosis present

## 2024-03-30 DIAGNOSIS — E66811 Obesity, class 1: Secondary | ICD-10-CM | POA: Diagnosis present

## 2024-03-30 DIAGNOSIS — Z9049 Acquired absence of other specified parts of digestive tract: Secondary | ICD-10-CM

## 2024-03-30 DIAGNOSIS — Z96653 Presence of artificial knee joint, bilateral: Secondary | ICD-10-CM | POA: Diagnosis present

## 2024-03-30 DIAGNOSIS — E119 Type 2 diabetes mellitus without complications: Secondary | ICD-10-CM

## 2024-03-30 LAB — COMPREHENSIVE METABOLIC PANEL WITH GFR
ALT: 13 U/L (ref 0–44)
ALT: 16 U/L (ref 0–44)
AST: 18 U/L (ref 15–41)
AST: 17 U/L (ref 15–41)
Albumin: 4.1 g/dL (ref 3.5–5.0)
Albumin: 3.6 g/dL (ref 3.5–5.0)
Alkaline Phosphatase: 68 U/L (ref 38–126)
Alkaline Phosphatase: 57 U/L (ref 38–126)
Anion gap: 13 (ref 5–15)
Anion gap: 10 (ref 5–15)
BUN: 7 mg/dL — ABNORMAL LOW (ref 8–23)
BUN: 8 mg/dL (ref 8–23)
CO2: 22 mmol/L (ref 22–32)
CO2: 21 mmol/L — ABNORMAL LOW (ref 22–32)
Calcium: 9.6 mg/dL (ref 8.9–10.3)
Calcium: 9.1 mg/dL (ref 8.9–10.3)
Chloride: 102 mmol/L (ref 98–111)
Chloride: 105 mmol/L (ref 98–111)
Creatinine, Ser: 0.67 mg/dL (ref 0.44–1.00)
Creatinine, Ser: 0.68 mg/dL (ref 0.44–1.00)
GFR, Estimated: >60 mL/min (ref >60)
GFR, Estimated: >60 mL/min (ref >60)
Glucose, Bld: 97 mg/dL (ref 70–99)
Glucose, Bld: 107 mg/dL — ABNORMAL HIGH (ref 70–99)
Potassium: 3.3 mmol/L — ABNORMAL LOW (ref 3.5–5.1)
Potassium: 3.8 mmol/L (ref 3.5–5.1)
Sodium: 138 mmol/L (ref 135–145)
Sodium: 136 mmol/L (ref 135–145)
Total Bilirubin: 0.3 mg/dL (ref 0.0–1.2)
Total Bilirubin: 0.6 mg/dL (ref 0.0–1.2)
Total Protein: 7.8 g/dL (ref 6.5–8.1)
Total Protein: 7.7 g/dL (ref 6.5–8.1)

## 2024-03-30 LAB — GLUCOSE, CAPILLARY: Glucose-Capillary: 128 mg/dL — ABNORMAL HIGH (ref 70–99)

## 2024-03-30 LAB — URINALYSIS, ROUTINE W REFLEX MICROSCOPIC
Bilirubin Urine: NEGATIVE
Glucose, UA: NEGATIVE mg/dL
Ketones, ur: NEGATIVE mg/dL
Leukocytes,Ua: NEGATIVE
Nitrite: NEGATIVE
Protein, ur: NEGATIVE mg/dL
Specific Gravity, Urine: 1.01 (ref 1.005–1.030)
pH: 6 (ref 5.0–8.0)

## 2024-03-30 LAB — CBC
HCT: 42 % (ref 36.0–46.0)
HCT: 43.7 % (ref 36.0–46.0)
Hemoglobin: 13.2 g/dL (ref 12.0–15.0)
Hemoglobin: 13.5 g/dL (ref 12.0–15.0)
MCH: 25.9 pg — ABNORMAL LOW (ref 26.0–34.0)
MCH: 26.5 pg (ref 26.0–34.0)
MCHC: 31.4 g/dL (ref 30.0–36.0)
MCHC: 30.9 g/dL (ref 30.0–36.0)
MCV: 82.4 fL (ref 80.0–100.0)
MCV: 85.7 fL (ref 80.0–100.0)
Platelets: 318 10*3/uL (ref 150–400)
Platelets: 317 10*3/uL (ref 150–400)
RBC: 5.1 MIL/uL (ref 3.87–5.11)
RBC: 5.1 MIL/uL (ref 3.87–5.11)
RDW: 14.6 % (ref 11.5–15.5)
RDW: 14.6 % (ref 11.5–15.5)
WBC: 6.1 10*3/uL (ref 4.0–10.5)
WBC: 4.5 10*3/uL (ref 4.0–10.5)
nRBC: 0 % (ref 0.0–0.2)
nRBC: 0 % (ref 0.0–0.2)

## 2024-03-30 LAB — URINALYSIS, MICROSCOPIC (REFLEX): RBC / HPF: >50 RBC/hpf (ref 0–5)

## 2024-03-30 LAB — PHOSPHORUS: Phosphorus: 3.8 mg/dL (ref 2.5–4.6)

## 2024-03-30 LAB — TSH: TSH: 1.939 u[IU]/mL (ref 0.350–4.500)

## 2024-03-30 LAB — MAGNESIUM: Magnesium: 2 mg/dL (ref 1.7–2.4)

## 2024-03-30 LAB — CK: Total CK: 64 U/L (ref 38–234)

## 2024-03-30 LAB — TYPE AND SCREEN
ABO/RH(D): O POS
Antibody Screen: NEGATIVE

## 2024-03-30 LAB — LIPASE, BLOOD: Lipase: 22 U/L (ref 11–51)

## 2024-03-30 MED ORDER — POTASSIUM CHLORIDE 10 MEQ/100ML IV SOLN
10.0000 meq | Freq: Once | INTRAVENOUS | Status: AC
  Administered 2024-03-30: 10 meq via INTRAVENOUS
  Filled 2024-03-30: qty 100

## 2024-03-30 MED ORDER — FLUOXETINE HCL 20 MG PO CAPS
20.0000 mg | ORAL_CAPSULE | Freq: Every day | ORAL | Status: DC
  Administered 2024-03-31 – 2024-04-01 (×2): 20 mg via ORAL
  Filled 2024-03-30 (×3): qty 1

## 2024-03-30 MED ORDER — ONDANSETRON HCL 4 MG/2ML IJ SOLN
4.0000 mg | Freq: Once | INTRAMUSCULAR | Status: AC
  Administered 2024-03-30: 4 mg via INTRAVENOUS
  Filled 2024-03-30: qty 2

## 2024-03-30 MED ORDER — HYDROMORPHONE HCL 1 MG/ML IJ SOLN
1.0000 mg | Freq: Once | INTRAMUSCULAR | Status: AC
  Administered 2024-03-30: 1 mg via INTRAVENOUS
  Filled 2024-03-30: qty 1

## 2024-03-30 MED ORDER — SODIUM CHLORIDE 0.9% FLUSH
3.0000 mL | Freq: Once | INTRAVENOUS | Status: DC
Start: 2024-03-30 — End: 2024-04-01
  Filled 2024-03-30: qty 3

## 2024-03-30 MED ORDER — CIPROFLOXACIN IN D5W 400 MG/200ML IV SOLN
400.0000 mg | Freq: Once | INTRAVENOUS | Status: AC
  Administered 2024-03-30: 400 mg via INTRAVENOUS
  Filled 2024-03-30: qty 200

## 2024-03-30 MED ORDER — SODIUM CHLORIDE 0.9 % IV SOLN: INTRAVENOUS | Status: AC

## 2024-03-30 MED ORDER — DIPHENHYDRAMINE HCL 50 MG/ML IJ SOLN
12.5000 mg | Freq: Once | INTRAMUSCULAR | Status: AC | PRN
  Administered 2024-03-31: 12.5 mg via INTRAVENOUS
  Filled 2024-03-30: qty 1

## 2024-03-30 MED ORDER — FENTANYL CITRATE PF 50 MCG/ML IJ SOSY
50.0000 ug | PREFILLED_SYRINGE | Freq: Once | INTRAMUSCULAR | Status: AC
  Administered 2024-03-30: 50 ug via INTRAVENOUS
  Filled 2024-03-30: qty 1

## 2024-03-30 MED ORDER — ONDANSETRON HCL 4 MG PO TABS
4.0000 mg | ORAL_TABLET | Freq: Four times a day (QID) | ORAL | Status: DC | PRN
Start: 2024-03-30 — End: 2024-04-01
  Administered 2024-04-01: 4 mg via ORAL
  Filled 2024-03-30: qty 1

## 2024-03-30 MED ORDER — METRONIDAZOLE 500 MG/100ML IV SOLN
500.0000 mg | Freq: Once | INTRAVENOUS | Status: AC
  Administered 2024-03-30: 500 mg via INTRAVENOUS
  Filled 2024-03-30: qty 100

## 2024-03-30 MED ORDER — INSULIN ASPART 100 UNIT/ML IJ SOLN: 0.0000 [IU] | INTRAMUSCULAR | Status: DC

## 2024-03-30 MED ORDER — METRONIDAZOLE 500 MG/100ML IV SOLN
500.0000 mg | Freq: Two times a day (BID) | INTRAVENOUS | Status: DC
  Administered 2024-03-31 – 2024-04-01 (×3): 500 mg via INTRAVENOUS
  Filled 2024-03-30 (×3): qty 100

## 2024-03-30 MED ORDER — IOHEXOL 300 MG/ML  SOLN
100.0000 mL | Freq: Once | INTRAMUSCULAR | Status: AC | PRN
  Administered 2024-03-30: 100 mL via INTRAVENOUS

## 2024-03-30 MED ORDER — MORPHINE SULFATE (PF) 4 MG/ML IV SOLN
4.0000 mg | Freq: Once | INTRAVENOUS | Status: AC
  Administered 2024-03-30: 4 mg via INTRAVENOUS
  Filled 2024-03-30: qty 1

## 2024-03-30 MED ORDER — FENTANYL CITRATE PF 50 MCG/ML IJ SOSY
50.0000 ug | PREFILLED_SYRINGE | INTRAMUSCULAR | Status: DC | PRN
  Administered 2024-03-30 – 2024-03-31 (×5): 50 ug via INTRAVENOUS
  Filled 2024-03-30 (×5): qty 1

## 2024-03-30 MED ORDER — CIPROFLOXACIN IN D5W 400 MG/200ML IV SOLN
400.0000 mg | Freq: Two times a day (BID) | INTRAVENOUS | Status: DC
  Administered 2024-03-31: 400 mg via INTRAVENOUS
  Filled 2024-03-30: qty 200

## 2024-03-30 MED ORDER — OXYCODONE HCL 5 MG PO TABS
5.0000 mg | ORAL_TABLET | Freq: Four times a day (QID) | ORAL | Status: DC | PRN
  Administered 2024-03-31 – 2024-04-01 (×4): 5 mg via ORAL
  Filled 2024-03-30 (×4): qty 1

## 2024-03-30 MED ORDER — DEXAMETHASONE SODIUM PHOSPHATE 10 MG/ML IJ SOLN
10.0000 mg | Freq: Once | INTRAMUSCULAR | Status: AC
  Administered 2024-03-30: 10 mg via INTRAVENOUS
  Filled 2024-03-30: qty 1

## 2024-03-30 MED ORDER — ONDANSETRON HCL 4 MG/2ML IJ SOLN
4.0000 mg | Freq: Four times a day (QID) | INTRAMUSCULAR | Status: DC | PRN
  Administered 2024-03-30 – 2024-03-31 (×3): 4 mg via INTRAVENOUS
  Filled 2024-03-30 (×3): qty 2

## 2024-03-30 NOTE — Assessment & Plan Note (Signed)
 Allow permissive htn ?

## 2024-03-30 NOTE — Assessment & Plan Note (Signed)
-   will replace electrolytes and repeat  check Mg, phos and Ca level and replace as needed Monitor on telemetry   Lab Results  Component Value Date   K 3.3 (L) 03/30/2024     Lab Results  Component Value Date   CREATININE 0.67 03/30/2024   Lab Results  Component Value Date   MG 2.2 01/03/2012   Lab Results  Component Value Date   CALCIUM  9.6 03/30/2024

## 2024-03-30 NOTE — Assessment & Plan Note (Signed)
-   Suspect [facility] source   - Admit  For further management given:  *Age >60 years,  comorbid illnesses      Sent msg to  gastroenterology ( [facility], )   appreciate their consult   - serial CBC.    - Monitor for any recurrence,  evidence of hemodynamic instability or significant blood loss -  type and screen,  - Transfuse as needed for hemoglobin below 7 or <9 if evidence of significant  bleeding  - Establish at least 2 PIV and fluid resuscitate   - clear liquids for tonight keep nothing by mouth post midnight,  -  monitor for Recurrent significant  Bleeding of red blood and hemodynamic instability

## 2024-03-30 NOTE — Subjective & Objective (Signed)
 Patient has been having abdominal pain for about a week now associated with some nausea and vomiting states this happened after her liver scan about a week ago.  She does have known history of ulcerative colitis and hepatitis C she has been followed by [name] in the past now being seen by [facility] al She is not on any blood thinners  Reports started to have mucus mixed with blood  For the past 2 days Reports fever yesterday up to 101  Reports ongoing abdominal pain Today she had pink in her vomitus as well. Denies use of antibiotics recently.

## 2024-03-30 NOTE — ED Triage Notes (Signed)
 Pt reports lower abdominal pain, nausea, vomiting and loose stools that started again yesterday, now has blood in stool. Stool looks like mucous. Pt seen Sunday for the same symptoms

## 2024-03-30 NOTE — Assessment & Plan Note (Signed)
-   no Evidence of perforation - Bowel rest clear liquid/ NPO post midnight  - Will rehydrate  - Continue IV antibiotics as patient was unable to tolerate p.o. due to significant nausea/vomiting    started on  metronidazole   Cipro  on 03/30/24

## 2024-03-30 NOTE — Assessment & Plan Note (Signed)
Will rehydrate and follow fluid status °

## 2024-03-30 NOTE — Assessment & Plan Note (Signed)
-   Order Sensitive SSI    -  check TSH and HgA1C  - Hold ozempic

## 2024-03-30 NOTE — Assessment & Plan Note (Signed)
 Restart home meds if able to tolerate

## 2024-03-30 NOTE — Assessment & Plan Note (Signed)
 Sent msg to [facility]   Hold off on steroid for now until seen by GI  Cont cipro /flagyl 

## 2024-03-30 NOTE — ED Provider Notes (Signed)
 [facility] Provider Note   CSN: [id number] Arrival date & time: 03/30/24  1149     History  Chief Complaint  Patient presents with   Abdominal Pain    Erin Wolf is a 68 y.o. female.  68 year old female presenting with abdominal pain, nausea/vomiting/bloody diarrhea.  Patient with history of ulcerative colitis, has follow-up with her primary care provider on Friday as well as scheduled for colonoscopy on the 19th, typically uses enema as needed as prescribed for UC but she has been out of this medication.  Patient was seen 2 days ago for same issue, reports bloody diarrhea is new and her abdominal pain worsened in the early hours of this morning, also describes mucoid appearance of stool.  She is prescribed oxycodone  for pain but has not taken it because her husband does not like this medication.  History of abdominal surgeries including cholecystectomy, hysterectomy/tubal ligation, laparotomy.  She did report a fever at home yesterday, none today.   Abdominal Pain      Home Medications Prior to Admission medications   Medication Sig Start Date End Date Taking? Authorizing Provider  FLUoxetine  (PROZAC ) 20 MG capsule Take 20 mg by mouth daily.     [provider]  metFORMIN (GLUCOPHAGE) 500 MG tablet Take 500 mg by mouth daily with lunch.    [provider]  Multiple Vitamins-Minerals (CENTRUM WOMEN) TABS Take 1 tablet by mouth daily with breakfast.    [provider]  naloxone (NARCAN) nasal spray 4 mg/0.1 mL Place 1 spray into the nose once as needed (opioid overdose).    [provider]  ondansetron  (ZOFRAN -ODT) 4 MG disintegrating tablet Take 1 tablet (4 mg total) by mouth every 8 (eight) hours as needed. 01/02/24   Spence Dux, PA-C  oxyCODONE  (ROXICODONE ) 5 MG immediate release tablet Take 1 tablet (5 mg total) by mouth every 4 (four) hours as needed for up to 12 doses for severe pain  (pain score 7-10). Patient taking differently: Take 5 mg by mouth every 4 (four) hours. 08/09/23   Countryman, Chase, MD  Polyethyl Glycol-Propyl Glycol (SYSTANE OP) Place 1 drop into both eyes 2 (two) times daily as needed (dry eyes).    [provider]      Allergies    Gabapentin, Latex, Nsaids, Penicillins, Wellbutrin [bupropion hcl], Aspirin , Celecoxib, Darvocet [propoxyphene n-acetaminophen ], Tape, Toradol  [ketorolac  tromethamine ], Tylenol  [acetaminophen ], Butalbital-apap-caffeine, Ciprofloxacin , Eszopiclone, Other, Topiramate, and Wound dressing adhesive    Review of Systems   Review of Systems  Gastrointestinal:  Positive for abdominal pain.    Physical Exam Updated Vital Signs  Vitals:   03/30/24 1159 03/30/24 1203 03/30/24 1230 03/30/24 1500  BP: 125/80  139/87 137/82  Pulse: (!) 109  97 79  Resp: 17  17 16   Temp: 98.2 F (36.8 C)  98.2 F (36.8 C)   TempSrc: Oral     SpO2: 100%  99% 100%  Weight:  102.1 kg      Physical Exam Vitals and nursing note reviewed.  HENT:     Head: Normocephalic.  Eyes:     Extraocular Movements: Extraocular movements intact.  Cardiovascular:     Rate and Rhythm: Regular rhythm. Tachycardia present.  Pulmonary:     Effort: Pulmonary effort is normal.     Breath sounds: Normal breath sounds.  Abdominal:     General: Bowel sounds are normal.     Palpations: Abdomen is soft.     Tenderness: There is  abdominal tenderness in the right upper quadrant and right lower quadrant. There is guarding.  Skin:    General: Skin is warm and dry.  Neurological:     Mental Status: She is alert and oriented to person, place, and time.    ED Results / Procedures / Treatments   Labs (all labs ordered are listed, but only abnormal results are displayed) Labs Reviewed  COMPREHENSIVE METABOLIC PANEL WITH GFR - Abnormal; Notable for the following components:      Result Value   Potassium 3.3 (*)    BUN 7 (*)    All other components within  normal limits  CBC - Abnormal; Notable for the following components:   MCH 25.9 (*)    All other components within normal limits  URINALYSIS, ROUTINE W REFLEX MICROSCOPIC - Abnormal; Notable for the following components:   Color, Urine AMBER (*)    APPearance CLOUDY (*)    Hgb urine dipstick LARGE (*)    All other components within normal limits  URINALYSIS, MICROSCOPIC (REFLEX) - Abnormal; Notable for the following components:   Bacteria, UA FEW (*)    All other components within normal limits  LIPASE, BLOOD    EKG None  Radiology CT ABDOMEN PELVIS W CONTRAST Result Date: 03/30/2024 CLINICAL DATA:  Nausea, vomiting, abdominal pain, bloody diarrhea. EXAM: CT ABDOMEN AND PELVIS WITH CONTRAST TECHNIQUE: Multidetector CT imaging of the abdomen and pelvis was performed using the standard protocol following bolus administration of intravenous contrast. RADIATION DOSE REDUCTION: This exam was performed according to the departmental dose-optimization program which includes automated exposure control, adjustment of the mA and/or kV according to patient size and/or use of iterative reconstruction technique. CONTRAST:  OMNIPAQUE  IOHEXOL  300 MG/ML  SOLN COMPARISON:  January 02, 2024. FINDINGS: Lower chest: No acute abnormality. Hepatobiliary: Stable intrahepatic and extrahepatic biliary dilatation is noted most likely due to post cholecystectomy status. Hepatic parenchyma is otherwise unremarkable. Pancreas: Stable pancreatic ductal dilatation is also noted. No acute inflammation is noted. Spleen: Normal in size without focal abnormality. Adrenals/Urinary Tract: Stable left adrenal adenoma. Right adrenal gland is unremarkable. No hydronephrosis or renal obstruction is noted. Urinary bladder appears to be decompressed. Stomach/Bowel: Stomach is unremarkable. The appendix is unremarkable. There is no evidence of bowel obstruction. Possible mild diverticulitis of proximal sigmoid colon is noted.  Vascular/Lymphatic: No significant vascular findings are present. No enlarged abdominal or pelvic lymph nodes. Reproductive: Status post hysterectomy. No adnexal masses. Other: No ascites or hernia is noted. Musculoskeletal: Status post left total hip arthroplasty. No acute osseous abnormality is noted. IMPRESSION: Possible mild proximal sigmoid diverticulitis. No definite abscess formation is noted. Electronically Signed   By: Rosalene Colon M.D.   On: 03/30/2024 14:53    Procedures Procedures    Medications Ordered in ED Medications  sodium chloride  flush (NS) 0.9 % injection 3 mL (3 mLs Intravenous Not Given 03/30/24 1210)  ciprofloxacin  (CIPRO ) IVPB 400 mg (400 mg Intravenous New Bag/Given 03/30/24 1624)    And  metroNIDAZOLE  (FLAGYL ) IVPB 500 mg (500 mg Intravenous New Bag/Given 03/30/24 1624)  ondansetron  (ZOFRAN ) injection 4 mg (4 mg Intravenous Given 03/30/24 1300)  morphine  (PF) 4 MG/ML injection 4 mg (4 mg Intravenous Given 03/30/24 1300)  potassium chloride  10 mEq in 100 mL IVPB (0 mEq Intravenous Stopped 03/30/24 1534)  iohexol  (OMNIPAQUE ) 300 MG/ML solution 100 mL (100 mLs Intravenous Contrast Given 03/30/24 1421)  fentaNYL  (SUBLIMAZE ) injection 50 mcg (50 mcg Intravenous Given 03/30/24 1433)  ondansetron  (ZOFRAN ) injection 4  mg (4 mg Intravenous Given 03/30/24 1433)  HYDROmorphone  (DILAUDID ) injection 1 mg (1 mg Intravenous Given 03/30/24 1533)  dexamethasone  (DECADRON ) injection 10 mg (10 mg Intravenous Given 03/30/24 1621)    ED Course/ Medical Decision Making/ A&P                                 Medical Decision Making This patient presents to the ED for concern of abd pain/nausea/vomiting/bloody diarrhea, this involves an extensive number of treatment options, and is a complaint that carries with it a high risk of complications and morbidity.  The differential diagnosis includes UC flare, diverticulosis/diverticulitis, gastroenteritis.   Co morbidities that complicate the  patient evaluation  Ulcerative colitis   Additional history obtained:  Additional history obtained from record review External records from outside source obtained and reviewed including recent ED note   Lab Tests:  I Ordered, and personally interpreted labs.  The pertinent results include: CBC remarkable, no leukocytosis.  CMP notable for mild hypokalemia with potassium of 3.3.  Urinalysis notable for large RBCs with few bacteria on microscopy, this may be contaminant as patient had episode of diarrhea while providing urine sample.  Lipase normal limits.   Imaging Studies ordered:  I ordered imaging studies including CT abdomen/pelvis  I independently visualized and interpreted imaging which showed Possible mild proximal sigmoid diverticulitis. No definite abscess formation is noted.  I agree with the radiologist interpretation   Cardiac Monitoring: / EKG:  The patient was maintained on a cardiac monitor.  I personally viewed and interpreted the cardiac monitored which showed an underlying rhythm of: NSR   Consultations Obtained:  I requested consultation with the hospitalist,  and discussed lab and imaging findings as well as pertinent plan - they recommend: Spoke with Dr. Duard Getting with the hospitalist service who agrees that this patient is appropriate for admission.   Problem List / ED Course / Critical interventions / Medication management   I ordered medication including morphine /fentanyl /dilaudid   for pain, Zofran  for nausea, IV potassium for mild hypokalemia  IV decadron  for possible colitis, IV cipro /flagyl  for diverticulitis  Reevaluation of the patient after these medicines showed that the patient stayed the same I have reviewed the patients home medicines and have made adjustments as needed   Social Determinants of Health:  Social isolation   Test / Admission - Considered:  Physical exam is notable for primarily right-sided abdominal tenderness to  palpation with guarding.  Patient was seen in the emergency department 2 days ago for same issue, known history of ulcerative colitis, CT scan was not performed at this time, given worsening nature of symptoms I do feel that imaging is warranted at this time, see above for CT interpretation.  Notable for possible proximal sigmoid diverticulitis.  She has follow-up scheduled with her primary care provider on Friday as well as colonoscopy scheduled for the 19th of this month.  At time of reassessment patient has received 4 of morphine , 50 of fentanyl  and pain is minimally controlled, she rates her current pain level as an 8 out of 10 as opposed to a 10 out of 10 upon my initial assessment.  I am concerned given patient's history of ulcerative colitis with poor pain control that she may benefit from hospital admission for IV antibiotics and pain management.  Will trial patient on additional pain medication and see how she responds.  After administration of Dilaudid , patient continues to rate her pain  as an 8 out of 10, patient continues to have vomiting and I was informed by nursing staff that she did appear to vomit red substance that resembled blood.  At this point I do feel that patient is candidate for admission given above findings.  Called and spoke with the hospitalist, see above for their recommendations.  Patient is appropriate for admission at this time.    Amount and/or Complexity of Data Reviewed Labs: ordered. Radiology: ordered.  Risk Prescription drug management. Decision regarding hospitalization.           Final Clinical Impression(s) / ED Diagnoses Final diagnoses:  Diarrhea, unspecified type  Diverticulitis  Ulcerative colitis with rectal bleeding, unspecified location [facility])  Hypokalemia    Rx / DC Orders ED Discharge Orders     None         Kendrick Pax, [place] 03/30/24 1648    Tegeler, Marine Sia, MD 04/06/24 1459

## 2024-03-30 NOTE — H&P (Signed)
 Erin Wolf:[id number] DOB: 05-15-1956 DOA: 03/30/2024     PCP: Chares Commons, PA-C      GI [facility] in the past   Patient arrived to ER on 03/30/24 at 1149 Referred by Attending No att. providers found   Patient coming from:    home Lives  With family      Chief Complaint:   Chief Complaint  Patient presents with   Abdominal Pain    HPI: Erin Wolf is a 68 y.o. female with medical history significant of HTN, HLD, UC    Presented with   , nausea/vomiting/bloody diarrhea.  Patient has been having abdominal pain for about a week now associated with some nausea and vomiting states this happened after her liver scan about a week ago.  She does have known history of ulcerative colitis and hepatitis C she has been followed by [facility] in the past now being seen by [facility] al She is not on any blood thinners  Reports started to have mucus mixed with blood  For the past 2 days Reports fever yesterday up to 101  Reports ongoing abdominal pain Today she had pink in her vomitus as well. Denies use of antibiotics recently.  Patient is scheduled to have a colonoscopy on 19th   Has been seen for abdominal pain 2 weeks ago.  She was prescribed oxycodone  but did not take it   Denies significant ETOH intake   Does not smoke     Regarding pertinent Chronic problems:    Hyperlipidemia -  not on statins   Lipid Panel     Component Value Date/Time   CHOL 222 (H) 06/30/2016 0232   TRIG 134 06/30/2016 0232   HDL 60 06/30/2016 0232   CHOLHDL 3.7 06/30/2016 0232   VLDL 27 06/30/2016 0232   LDLCALC 135 (H) 06/30/2016 0232       DM 2 -  Lab Results  Component Value Date   HGBA1C 6.4 (H) 11/26/2022   on Ozempic   obesity-   BMI Readings from Last 1 Encounters:  03/30/24 38.79 kg/m       While in ER:    CT showed diverticultis On cipro  falgyl Hg stable    Lab Orders         Lipase, blood         Comprehensive metabolic panel          CBC         Urinalysis, Routine w reflex microscopic -Urine, Clean Catch         Urinalysis, Microscopic (reflex)        CTabd/pelvis -Possible mild proximal sigmoid diverticulitis. No definite abscess formation is noted.  Following Medications were ordered in ER: Medications  sodium chloride  flush (NS) 0.9 % injection 3 mL (3 mLs Intravenous Not Given 03/30/24 1210)  ondansetron  (ZOFRAN ) injection 4 mg (4 mg Intravenous Given 03/30/24 1300)  morphine  (PF) 4 MG/ML injection 4 mg (4 mg Intravenous Given 03/30/24 1300)  potassium chloride  10 mEq in 100 mL IVPB (0 mEq Intravenous Stopped 03/30/24 1534)  iohexol  (OMNIPAQUE ) 300 MG/ML solution 100 mL (100 mLs Intravenous Contrast Given 03/30/24 1421)  fentaNYL  (SUBLIMAZE ) injection 50 mcg (50 mcg Intravenous Given 03/30/24 1433)  ondansetron  (ZOFRAN ) injection 4 mg (4 mg Intravenous Given 03/30/24 1433)  HYDROmorphone  (DILAUDID ) injection 1 mg (1 mg Intravenous Given 03/30/24 1533)  dexamethasone  (DECADRON ) injection 10 mg (10 mg Intravenous Given 03/30/24 1621)  ciprofloxacin  (CIPRO ) IVPB 400 mg (  0 mg Intravenous Stopped 03/30/24 1728)    And  metroNIDAZOLE  (FLAGYL ) IVPB 500 mg (0 mg Intravenous Stopped 03/30/24 1728)    ___    ED Triage Vitals  Encounter Vitals Group     BP 03/30/24 1159 125/80     Systolic BP Percentile --      Diastolic BP Percentile --      Pulse Rate 03/30/24 1159 (!) 109     Resp 03/30/24 1159 17     Temp 03/30/24 1159 98.2 F (36.8 C)     Temp Source 03/30/24 1159 Oral     SpO2 03/30/24 1159 100 %     Weight 03/30/24 1203 225 lb (102.1 kg)     Height 03/30/24 1842 5\' 4"  (1.626 m)     Head Circumference --      Peak Flow --      Pain Score 03/30/24 1203 10     Pain Loc --      Pain Education --      Exclude from Growth Chart --   WUJW(11)@     _________________________________________ Significant initial  Findings: Abnormal Labs Reviewed  COMPREHENSIVE METABOLIC PANEL WITH GFR - Abnormal; Notable for the  following components:      Result Value   Potassium 3.3 (*)    BUN 7 (*)    All other components within normal limits  CBC - Abnormal; Notable for the following components:   MCH 25.9 (*)    All other components within normal limits  URINALYSIS, ROUTINE W REFLEX MICROSCOPIC - Abnormal; Notable for the following components:   Color, Urine AMBER (*)    APPearance CLOUDY (*)    Hgb urine dipstick LARGE (*)    All other components within normal limits  URINALYSIS, MICROSCOPIC (REFLEX) - Abnormal; Notable for the following components:   Bacteria, UA FEW (*)    All other components within normal limits     ECG: Ordered     The recent clinical data is shown below. Vitals:   03/30/24 1645 03/30/24 1715 03/30/24 1837 03/30/24 1842  BP: 114/77 100/81 (!) 147/89   Pulse: 75 83 78   Resp: 14 (!) 25 18   Temp: 98.2 F (36.8 C) 98.2 F (36.8 C) 97.9 F (36.6 C)   TempSrc:   Oral   SpO2: 100% 99% 100%   Weight:    102.5 kg  Height:    5\' 4"  (1.626 m)    WBC     Component Value Date/Time   WBC 6.1 03/30/2024 1205   LYMPHSABS 2.0 01/02/2024 1410   MONOABS 0.4 01/02/2024 1410   EOSABS 0.0 01/02/2024 1410   BASOSABS 0.0 01/02/2024 1410     Lactic Acid, Venous    Component Value Date/Time   LATICACIDVEN 0.8 01/07/2023 1847     Lactic Acid, Venous    Component Value Date/Time   LATICACIDVEN 0.8 01/07/2023 1847       UA   no evidence of UTI  hematuria likely a contaminate   Urine analysis:    Component Value Date/Time   COLORURINE AMBER (A) 03/30/2024 1209   APPEARANCEUR CLOUDY (A) 03/30/2024 1209   LABSPEC 1.010 03/30/2024 1209   PHURINE 6.0 03/30/2024 1209   GLUCOSEU NEGATIVE 03/30/2024 1209   HGBUR LARGE (A) 03/30/2024 1209   BILIRUBINUR NEGATIVE 03/30/2024 1209   KETONESUR NEGATIVE 03/30/2024 1209   PROTEINUR NEGATIVE 03/30/2024 1209   UROBILINOGEN 0.2 04/17/2015 0644   NITRITE NEGATIVE 03/30/2024 1209   LEUKOCYTESUR NEGATIVE 03/30/2024  1209    Results for  orders placed or performed during the hospital encounter of 01/02/24  Urine Culture     Status: Abnormal   Collection Time: 01/02/24 11:15 PM   Specimen: Urine, Clean Catch  Result Value Ref Range Status   Specimen Description   Final    URINE, CLEAN CATCH Performed at [facility], [address] [place], [place] [address]    Special Requests   Final    NONE Performed at [facility], [address] [place], [place] [address]    Culture MULTIPLE SPECIES PRESENT, SUGGEST RECOLLECTION (A)  Final   Report Status 01/04/2024 FINAL  Final    ABX started Antibiotics Given (last 72 hours)     Date/Time Action Medication Dose Rate   03/30/24 1624 New Bag/Given   ciprofloxacin  (CIPRO ) IVPB 400 mg 400 mg 200 mL/hr   03/30/24 1624 New Bag/Given   metroNIDAZOLE  (FLAGYL ) IVPB 500 mg 500 mg 100 mL/hr        No results found for the last 90 days.       __________________________________________________________ Recent Labs  Lab 03/28/24 1038 03/30/24 1205  NA 137 138  K 3.5 3.3*  CO2 23 22  GLUCOSE 123* 97  BUN 11 7*  CREATININE 0.60 0.67  CALCIUM  9.2 9.6    Cr   stable,    Lab Results  Component Value Date   CREATININE 0.67 03/30/2024   CREATININE 0.60 03/28/2024   CREATININE 0.59 01/02/2024    Recent Labs  Lab 03/28/24 1038 03/30/24 1205  AST 20 18  ALT 17 13  ALKPHOS 63 68  BILITOT 0.5 0.3  PROT 7.9 7.8  ALBUMIN 3.8 4.1   Lab Results  Component Value Date   CALCIUM  9.6 03/30/2024    Plt: Lab Results  Component Value Date   PLT 318 03/30/2024       Recent Labs  Lab 03/28/24 1038 03/30/24 1205  WBC 5.3 6.1  HGB 13.7 13.2  HCT 43.6 42.0  MCV 83.5 82.4  PLT 302 318    HG/HCT  stable     Component Value Date/Time   HGB 13.2 03/30/2024 1205   HGB 12.3 04/24/2022 1336   HCT 42.0 03/30/2024 1205   MCV 82.4 03/30/2024 1205      Recent Labs  Lab 03/28/24 1038 03/30/24 1205  LIPASE 29 22      _______________________________________________ Hospitalist was called for admission for   Diarrhea,  Diverticulitis  Ulcerative colitis with rectal bleeding,   Hypokalemia    The following Work up has been ordered so far:  Orders Placed This Encounter  Procedures   CT ABDOMEN PELVIS W CONTRAST   Lipase, blood   Comprehensive metabolic panel   CBC   Urinalysis, Routine w reflex microscopic -Urine, Clean Catch   Urinalysis, Microscopic (reflex)   Diet NPO time specified   Saline Lock IV, Maintain IV access (when placed in a treatment room)   ED Cardiac monitoring   Consult to hospitalist   Place in observation (patient's expected length of stay will be less than 2 midnights)     OTHER Significant initial  Findings:  labs showing:     DM  labs:  HbA1C: No results for input(s): "HGBA1C" in the last 8760 hours.     CBG (last 3)  No results for input(s): "GLUCAP" in the last 72 hours.        Cultures:    Component Value Date/Time  SDES  01/02/2024 2315    URINE, CLEAN CATCH Performed at [facility], [address] Johnella Naas [place], [place] [address]    [facility]  01/02/2024 2315    NONE Performed at [facility], [address] [place], [place] [address]    CULT MULTIPLE SPECIES PRESENT, SUGGEST RECOLLECTION (A) 01/02/2024 2315   REPTSTATUS 01/04/2024 FINAL 01/02/2024 2315     Radiological Exams on Admission: CT ABDOMEN PELVIS W CONTRAST Result Date: 03/30/2024 CLINICAL DATA:  Nausea, vomiting, abdominal pain, bloody diarrhea. EXAM: CT ABDOMEN AND PELVIS WITH CONTRAST TECHNIQUE: Multidetector CT imaging of the abdomen and pelvis was performed using the standard protocol following bolus administration of intravenous contrast. RADIATION DOSE REDUCTION: This exam was performed according to the departmental dose-optimization program which includes automated exposure control, adjustment of the mA and/or kV according to patient size and/or use of  iterative reconstruction technique. CONTRAST:  OMNIPAQUE  IOHEXOL  300 MG/ML  SOLN COMPARISON:  January 02, 2024. FINDINGS: Lower chest: No acute abnormality. Hepatobiliary: Stable intrahepatic and extrahepatic biliary dilatation is noted most likely due to post cholecystectomy status. Hepatic parenchyma is otherwise unremarkable. Pancreas: Stable pancreatic ductal dilatation is also noted. No acute inflammation is noted. Spleen: Normal in size without focal abnormality. Adrenals/Urinary Tract: Stable left adrenal adenoma. Right adrenal gland is unremarkable. No hydronephrosis or renal obstruction is noted. Urinary bladder appears to be decompressed. Stomach/Bowel: Stomach is unremarkable. The appendix is unremarkable. There is no evidence of bowel obstruction. Possible mild diverticulitis of proximal sigmoid colon is noted. Vascular/Lymphatic: No significant vascular findings are present. No enlarged abdominal or pelvic lymph nodes. Reproductive: Status post hysterectomy. No adnexal masses. Other: No ascites or hernia is noted. Musculoskeletal: Status post left total hip arthroplasty. No acute osseous abnormality is noted. IMPRESSION: Possible mild proximal sigmoid diverticulitis. No definite abscess formation is noted. Electronically Signed   By: Rosalene Colon M.D.   On: 03/30/2024 14:53   _______________________________________________________________________________________________________ Latest  Blood pressure (!) 147/89, pulse 78, temperature 97.9 F (36.6 C), temperature source Oral, resp. rate 18, height 5\' 4"  (1.626 m), weight 102.5 kg, SpO2 100%.   Vitals  labs and radiology finding personally reviewed  Review of Systems:    Pertinent positives include:  , Fevers, chills, fatigue abdominal pain, nausea, vomiting, diarrhea, blood in stool,  Constitutional:  No weight loss, night sweats, weight loss  HEENT:  No headaches, Difficulty swallowing,Tooth/dental problems,Sore throat,  No  sneezing, itching, ear ache, nasal congestion, post nasal drip,  Cardio-vascular:  No chest pain, Orthopnea, PND, anasarca, dizziness, palpitations.no Bilateral lower extremity swelling  GI:  No heartburn, indigestion, , change in bowel habits, loss of appetite, melena hematemesis Resp:  no shortness of breath at rest. No dyspnea on exertion, No excess mucus, no productive cough, No non-productive cough, No coughing up of blood.No change in color of mucus.No wheezing. Skin:  no rash or lesions. No jaundice GU:  no dysuria, change in color of urine, no urgency or frequency. No straining to urinate.  No flank pain.  Musculoskeletal:  No joint pain or no joint swelling. No decreased range of motion. No back pain.  Psych:  No change in mood or affect. No depression or anxiety. No memory loss.  Neuro: no localizing neurological complaints, no tingling, no weakness, no double vision, no gait abnormality, no slurred speech, no confusion  All systems reviewed and apart from HOPI all are negative _______________________________________________________________________________________________ Past Medical History:   Past Medical History:  Diagnosis Date  Anxiety    Arthritis    Chronic abdominal pain    Chronic lower back pain    Chronic nausea    Depression    Diabetes mellitus without complication (HCC)    not on meds   Diverticulosis    Dyspnea    with exertion   Frequency of urination    GERD (gastroesophageal reflux disease)    Headache    Hematuria    Hepatitis    pt denies   Hepatitis C antibody positive in blood    per pt told by pcp   History of panic attacks    History of syncope    hx recurrent syncope --- per epic domentation non-cardiac , orthostatic hypotension, anxiety, dehydration   History of uterine fibroid    HTN (hypertension), benign    Hyperlipidemia 01/03/2012   IBS (irritable bowel syndrome)    Myalgia    Sciatic nerve disease, left 2023   Seizure  disorder (HCC) followed by pcp until new neurologist appr. in jan 2019   "started having them in my 20's" ,  petit mal ----  per pt last seizure one 2015   SLE (systemic lupus erythematosus) [facility])    rheumatologist-- dr Debrah Fan (consult 11-12-2016) having work-up done   Ulcerative colitis    [facility] dr Elsie [facility] at [facility]   Wears glasses       Past Surgical History:  Procedure Laterality Date   ABDOMINAL HYSTERECTOMY  1988   ARTERY BIOPSY Left 06/11/2022   Procedure: LEFT BIOPSY TEMPORAL ARTERY;  Surgeon: Dorrie Gaudier Alphonso Aschoff, MD;  Location: [facility];  Service: General;  Laterality: Left;   CHOLECYSTECTOMY OPEN  1978   COLONOSCOPY N/A 05/31/2013   Procedure: COLONOSCOPY;  Surgeon: Celedonio Coil, MD;  Location: [facility] ENDOSCOPY;  Service: Endoscopy;  Laterality: N/A;   CYSTOSCOPY/RETROGRADE/URETEROSCOPY Bilateral 07/23/2017   Procedure: CYSTOSCOPY/RETROGRADE/URETEROSCOPY;  Surgeon: Adelbert Homans, MD;  Location[facility];  Service: Urology;  Laterality: Bilateral;   detached retina on right eye surgery      ESOPHAGOGASTRODUODENOSCOPY (EGD) WITH PROPOFOL  N/A 05/11/2014   Procedure: ESOPHAGOGASTRODUODENOSCOPY (EGD) WITH PROPOFOL ;  Surgeon: Yvetta Herbert, MD;  Location: [facility] ENDOSCOPY;  Service: Endoscopy;  Laterality: N/A;  egd first   ESOPHAGOGASTRODUODENOSCOPY (EGD) WITH PROPOFOL  N/A 05/08/2020   Procedure: ESOPHAGOGASTRODUODENOSCOPY (EGD) WITH PROPOFOL ;  Surgeon: Evangeline Hilts, MD;  Location: [facility] ENDOSCOPY;  Service: Endoscopy;  Laterality: N/A;   EUS N/A 06/21/2016   Procedure: ESOPHAGEAL ENDOSCOPIC ULTRASOUND (EUS) RADIAL;  Surgeon: Evangeline Hilts, MD;  Location: [facility] ENDOS[facility];  Service: Endoscopy;  Laterality: N/A;   FLEXIBLE SIGMOIDOSCOPY N/A 05/11/2014   Procedure: FLEXIBLE SIGMOIDOSCOPY;  Surgeon: Yvetta Herbert, MD;  Location: [facility];  Service: Endoscopy;  Laterality: N/A;   LAPAROTOMY W/ BILATERAL SALPINGOOPHORECTOMY  09-26-2003   dr  Steve El at [facility]   LAPROSCOPY W/ LYSIS ADHESIONS  06-08-2003   dr Cloretta Danes [facility]   TOTAL HIP ARTHROPLASTY Left 12/03/2022   Procedure: TOTAL HIP ARTHROPLASTY ANTERIOR APPROACH;  Surgeon: Claiborne Crew, MD;  Location: [facility];  Service: Orthopedics;  Laterality: Left;   TOTAL KNEE ARTHROPLASTY Left 12/19/2020   Procedure: TOTAL KNEE ARTHROPLASTY;  Surgeon: Claiborne Crew, MD;  Location: [facility];  Service: Orthopedics;  Laterality: Left;  70 mins   TOTAL KNEE ARTHROPLASTY Right 03/27/2021   Procedure: TOTAL KNEE ARTHROPLASTY;  Surgeon: Claiborne Crew, MD;  Location: [facility];  Service: Orthopedics;  Laterality: Right;  70 mins   TRANSTHORACIC ECHOCARDIOGRAM  04/29/2016   ef 60-65%,  grade  2 diastolic dysfunction/  trivial MR and TR   TUBAL LIGATION Bilateral yrs ago   UPPER ESOPHAGEAL ENDOSCOPIC ULTRASOUND (EUS) N/A 05/08/2020   Procedure: UPPER ESOPHAGEAL ENDOSCOPIC ULTRASOUND (EUS);  Surgeon: Evangeline Hilts, MD;  Location: [facility];  Service: Endoscopy;  Laterality: N/A;    Social History:  Ambulatory  cane,       reports that she has never smoked. She has never used smokeless tobacco. She reports that she does not drink alcohol  and does not use drugs.   Family History:   Family History  Problem Relation Age of Onset   Alzheimer's disease Mother    Hypertension Mother    Colon cancer Brother    Hypertension Brother    Other Father        ruptured appendix   Breast cancer Sister    Hypertension Sister    Hypertension Sister    Memory loss Brother    Hypertension Sister    Heart disease Sister    ______________________________________________________________________________________________ Allergies: Allergies  Allergen Reactions   Gabapentin Other (See Comments)    Abdominal pain   Latex Other (See Comments)    Peels skin  Other Reaction(s): other  latex   Nsaids Other (See Comments)    HAS COLITIS FLARES   Penicillins Hives and Other (See Comments)    Tolerated  Cephalosporin Date: 12/19/20   Wellbutrin [Bupropion Hcl] Other (See Comments)    Insomnia and headaches   Aspirin  Nausea Only and Other (See Comments)    GI Intolerance   Celecoxib Nausea Only   Darvocet [Propoxyphene N-Acetaminophen ] Nausea And Vomiting   Tape Hives   Toradol  [Ketorolac  Tromethamine ] Nausea And Vomiting   Tylenol  [Acetaminophen ] Nausea Only   Butalbital-Apap-Caffeine Other (See Comments)    Stomach upset   Ciprofloxacin  Other (See Comments)    Stomach upset   Eszopiclone Swelling   Other Diarrhea and Other (See Comments)    Spicy foods cause diarrhea due to colitis   Topiramate     stomach upset   Wound Dressing Adhesive Hives     Prior to Admission medications   Medication Sig Start Date End Date Taking? Authorizing Provider  FLUoxetine  (PROZAC ) 20 MG capsule Take 20 mg by mouth daily.     [provider]  metFORMIN (GLUCOPHAGE) 500 MG tablet Take 500 mg by mouth daily with lunch.    [provider]  Multiple Vitamins-Minerals (CENTRUM WOMEN) TABS Take 1 tablet by mouth daily with breakfast.    [provider]  naloxone (NARCAN) nasal spray 4 mg/0.1 mL Place 1 spray into the nose once as needed (opioid overdose).    [provider]  ondansetron  (ZOFRAN -ODT) 4 MG disintegrating tablet Take 1 tablet (4 mg total) by mouth every 8 (eight) hours as needed. 01/02/24   Spence Dux, PA-C  oxyCODONE  (ROXICODONE ) 5 MG immediate release tablet Take 1 tablet (5 mg total) by mouth every 4 (four) hours as needed for up to 12 doses for severe pain (pain score 7-10). Patient taking differently: Take 5 mg by mouth every 4 (four) hours. 08/09/23   Countryman, Chase, MD  Polyethyl Glycol-Propyl Glycol (SYSTANE OP) Place 1 drop into both eyes 2 (two) times daily as needed (dry eyes).    [provider]    ___________________________________________________________________________________________________ Physical Exam:    03/30/2024     6:42 PM 03/30/2024    6:37 PM 03/30/2024    5:15 PM  Vitals with BMI  Height 5\' 4"     Weight 226 lbs  BMI 38.77    Systolic  147 100  Diastolic  89 81  Pulse  78 83    1. General:  in No  Acute distress    Chronically ill   -appearing 2. Psychological: Alert and   Oriented 3. Head/ENT:   Dry Mucous Membranes                          Head Non traumatic, neck supple                           Poor Dentition 4. SKIN:  decreased Skin turgor,  Skin clean Dry and intact no rash    5. Heart: Regular rate and rhythm no  Murmur, no Rub or gallop 6. Lungs:  no wheezes or crackles   7. Abdomen: Soft mildly -tender, Non distended   obese  bowel sounds present 8. Lower extremities: no clubbing, cyanosis, no  edema 9. Neurologically Grossly intact, moving all 4 extremities equally   10. MSK: Normal range of motion    Chart has been reviewed  ______________________________________________________________________________________________  Assessment/Plan  68 y.o. female with medical history significant of HTN, HLD, UC  Admitted for    Diarrhea, Diverticulitis  Ulcerative colitis with rectal bleeding,   Hypokalemia    Present on Admission:  Acute diverticulitis  Chronic abdominal pain  Chronic ulcerative colitis (HCC)  Dehydration  HTN (hypertension), benign  Hypokalemia  Lower GI bleed     Acute diverticulitis   - no Evidence of perforation - Bowel rest clear liquid/ NPO post midnight  - Will rehydrate  - Continue IV antibiotics as patient was unable to tolerate p.o. due to significant nausea/vomiting    started on  metronidazole   Cipro  on 03/30/24   Chronic abdominal pain Restart home meds if able to tolerate  Chronic ulcerative colitis (HCC) Sent msg to [facility]   Hold off on steroid for now until seen by GI  Cont cipro /flagyl   Dehydration Will rehydrate and follow fluid status  HTN (hypertension), benign Allow permissive htn   Hypokalemia - will replace  electrolytes and repeat  check Mg, phos and Ca level and replace as needed Monitor on telemetry   Lab Results  Component Value Date   K 3.3 (L) 03/30/2024     Lab Results  Component Value Date   CREATININE 0.67 03/30/2024   Lab Results  Component Value Date   MG 2.2 01/03/2012   Lab Results  Component Value Date   CALCIUM  9.6 03/30/2024     Lower GI bleed - Suspect Lower Gi source   - Admit  For further management given:  *Age >60 years,  comorbid illnesses      Sent msg to  gastroenterology ( [facility], )   appreciate their consult   - serial CBC.    - Monitor for any recurrence,  evidence of hemodynamic instability or significant blood loss -  type and screen,  - Transfuse as needed for hemoglobin below 7 or <9 if evidence of significant  bleeding  - Establish at least 2 PIV and fluid resuscitate   - clear liquids for tonight keep nothing by mouth post midnight,  -  monitor for Recurrent significant  Bleeding of red blood and hemodynamic instability    DM (diabetes mellitus), type 2 (HCC)  - Order Sensitive SSI    -  check TSH and HgA1C  - Hold ozempic  Other plan as per orders.  DVT prophylaxis:  SCD     Code Status:    Code Status: Prior FULL CODE   as per patient   I had personally discussed CODE STATUS with patient   ACP   none   Family Communication:   Family not at  Bedside    Diet  Diet Orders (From admission, onward)     Start     Ordered   03/30/24 1927  Diet clear liquid Room service appropriate? Yes; Fluid consistency: Thin  Diet effective now       Question Answer Comment  Room service appropriate? Yes   Fluid consistency: Thin      03/30/24 1928            Disposition Plan:       To home once workup is complete and patient is stable   Following barriers for discharge:                                                          Electrolytes corrected                               Anemia  stable                             Pain  controlled with PO medications                                able to transition to PO antibiotics                             Will need to be able to tolerate PO                                                     Will need consultants to evaluate patient prior to discharge                            Consult Orders  (From admission, onward)           Start     Ordered   03/30/24 1611  Consult to hospitalist  Called [facility] at 1616 spoke with Andy Bannister  Once       Provider:  (Not yet assigned)  Question Answer Comment  Place call to: [facility]   Reason for Consult Admit      03/30/24 1610                               Would benefit from PT/OT eval prior to DC  Ordered                                      Consults called: [facility]  Admission status:  ED Disposition     ED Disposition  Admit   Condition  --   Comment  Hospital Area: [facility] [[id number]]  Level of Care: Med-Surg [16]  Interfacility transfer: Yes  May place patient in observation at [facility] or [facility] [place] if equivalent level of care is available:: Yes  Covid Evaluation: Asymptomatic - no recent exposure (last 10 days) testing not required  Diagnosis: Acute diverticulitis [[id number]]  Admitting Physician: Verlyn Goad [[id number]]  Attending Physician: Craige Dixon [[id number]]           Obs     Level of care        medical floor    Selene Dais 03/30/2024, 7:54 PM    [facility]     after 2 AM please page floor coverage   If 7AM-7PM, please contact the day team taking care of the patient using [email or web address]

## 2024-03-31 DIAGNOSIS — K5732 Diverticulitis of large intestine without perforation or abscess without bleeding: Secondary | ICD-10-CM | POA: Diagnosis present

## 2024-03-31 DIAGNOSIS — E876 Hypokalemia: Secondary | ICD-10-CM | POA: Diagnosis present

## 2024-03-31 DIAGNOSIS — Z9049 Acquired absence of other specified parts of digestive tract: Secondary | ICD-10-CM | POA: Diagnosis not present

## 2024-03-31 DIAGNOSIS — Z96653 Presence of artificial knee joint, bilateral: Secondary | ICD-10-CM | POA: Diagnosis present

## 2024-03-31 DIAGNOSIS — K5190 Ulcerative colitis, unspecified, without complications: Secondary | ICD-10-CM | POA: Diagnosis present

## 2024-03-31 DIAGNOSIS — M329 Systemic lupus erythematosus, unspecified: Secondary | ICD-10-CM | POA: Diagnosis present

## 2024-03-31 DIAGNOSIS — Z7985 Long-term (current) use of injectable non-insulin antidiabetic drugs: Secondary | ICD-10-CM | POA: Diagnosis not present

## 2024-03-31 DIAGNOSIS — Z880 Allergy status to penicillin: Secondary | ICD-10-CM | POA: Diagnosis not present

## 2024-03-31 DIAGNOSIS — K5792 Diverticulitis of intestine, part unspecified, without perforation or abscess without bleeding: Secondary | ICD-10-CM | POA: Diagnosis not present

## 2024-03-31 DIAGNOSIS — I10 Essential (primary) hypertension: Secondary | ICD-10-CM | POA: Diagnosis present

## 2024-03-31 DIAGNOSIS — B1920 Unspecified viral hepatitis C without hepatic coma: Secondary | ICD-10-CM | POA: Diagnosis present

## 2024-03-31 DIAGNOSIS — G40909 Epilepsy, unspecified, not intractable, without status epilepticus: Secondary | ICD-10-CM | POA: Diagnosis present

## 2024-03-31 DIAGNOSIS — G8929 Other chronic pain: Secondary | ICD-10-CM | POA: Diagnosis present

## 2024-03-31 DIAGNOSIS — E860 Dehydration: Secondary | ICD-10-CM | POA: Diagnosis present

## 2024-03-31 DIAGNOSIS — E871 Hypo-osmolality and hyponatremia: Secondary | ICD-10-CM | POA: Diagnosis not present

## 2024-03-31 DIAGNOSIS — F1120 Opioid dependence, uncomplicated: Secondary | ICD-10-CM | POA: Diagnosis present

## 2024-03-31 DIAGNOSIS — Z7984 Long term (current) use of oral hypoglycemic drugs: Secondary | ICD-10-CM | POA: Diagnosis not present

## 2024-03-31 DIAGNOSIS — Z8249 Family history of ischemic heart disease and other diseases of the circulatory system: Secondary | ICD-10-CM | POA: Diagnosis not present

## 2024-03-31 DIAGNOSIS — E8721 Acute metabolic acidosis: Secondary | ICD-10-CM | POA: Diagnosis not present

## 2024-03-31 DIAGNOSIS — K219 Gastro-esophageal reflux disease without esophagitis: Secondary | ICD-10-CM | POA: Diagnosis present

## 2024-03-31 DIAGNOSIS — E66811 Obesity, class 1: Secondary | ICD-10-CM | POA: Diagnosis present

## 2024-03-31 DIAGNOSIS — Z820 Family history of epilepsy and other diseases of the nervous system: Secondary | ICD-10-CM | POA: Diagnosis not present

## 2024-03-31 DIAGNOSIS — E785 Hyperlipidemia, unspecified: Secondary | ICD-10-CM | POA: Diagnosis present

## 2024-03-31 DIAGNOSIS — E119 Type 2 diabetes mellitus without complications: Secondary | ICD-10-CM | POA: Diagnosis present

## 2024-03-31 DIAGNOSIS — Z96642 Presence of left artificial hip joint: Secondary | ICD-10-CM | POA: Diagnosis present

## 2024-03-31 LAB — CBC
HCT: 42.4 % (ref 36.0–46.0)
HCT: 43.4 % (ref 36.0–46.0)
Hemoglobin: 13 g/dL (ref 12.0–15.0)
Hemoglobin: 13.3 g/dL (ref 12.0–15.0)
MCH: 26.1 pg (ref 26.0–34.0)
MCH: 26.3 pg (ref 26.0–34.0)
MCHC: 30.7 g/dL (ref 30.0–36.0)
MCHC: 30.6 g/dL (ref 30.0–36.0)
MCV: 85 fL (ref 80.0–100.0)
MCV: 85.9 fL (ref 80.0–100.0)
Platelets: 323 10*3/uL (ref 150–400)
Platelets: 331 10*3/uL (ref 150–400)
RBC: 4.99 MIL/uL (ref 3.87–5.11)
RBC: 5.05 MIL/uL (ref 3.87–5.11)
RDW: 14.7 % (ref 11.5–15.5)
RDW: 14.8 % (ref 11.5–15.5)
WBC: 4.6 10*3/uL (ref 4.0–10.5)
WBC: 5.1 10*3/uL (ref 4.0–10.5)
nRBC: 0 % (ref 0.0–0.2)
nRBC: 0 % (ref 0.0–0.2)

## 2024-03-31 LAB — GLUCOSE, CAPILLARY
Glucose-Capillary: 111 mg/dL — ABNORMAL HIGH (ref 70–99)
Glucose-Capillary: 143 mg/dL — ABNORMAL HIGH (ref 70–99)
Glucose-Capillary: 85 mg/dL (ref 70–99)

## 2024-03-31 LAB — COMPREHENSIVE METABOLIC PANEL WITH GFR
ALT: 13 U/L (ref 0–44)
AST: 14 U/L — ABNORMAL LOW (ref 15–41)
Albumin: 3.2 g/dL — ABNORMAL LOW (ref 3.5–5.0)
Alkaline Phosphatase: 53 U/L (ref 38–126)
Anion gap: 9 (ref 5–15)
BUN: 10 mg/dL (ref 8–23)
CO2: 21 mmol/L — ABNORMAL LOW (ref 22–32)
Calcium: 8.9 mg/dL (ref 8.9–10.3)
Chloride: 104 mmol/L (ref 98–111)
Creatinine, Ser: 0.52 mg/dL (ref 0.44–1.00)
GFR, Estimated: >60 mL/min (ref >60)
Glucose, Bld: 118 mg/dL — ABNORMAL HIGH (ref 70–99)
Potassium: 4.1 mmol/L (ref 3.5–5.1)
Sodium: 134 mmol/L — ABNORMAL LOW (ref 135–145)
Total Bilirubin: 0.5 mg/dL (ref 0.0–1.2)
Total Protein: 7.6 g/dL (ref 6.5–8.1)

## 2024-03-31 LAB — MAGNESIUM: Magnesium: 2.1 mg/dL (ref 1.7–2.4)

## 2024-03-31 LAB — PHOSPHORUS: Phosphorus: 3 mg/dL (ref 2.5–4.6)

## 2024-03-31 MED ORDER — HYDROXYZINE HCL 10 MG PO TABS
10.0000 mg | ORAL_TABLET | Freq: Once | ORAL | Status: AC | PRN
  Administered 2024-03-31: 10 mg via ORAL
  Filled 2024-03-31: qty 1

## 2024-03-31 MED ORDER — HYDROXYZINE HCL 10 MG PO TABS
10.0000 mg | ORAL_TABLET | Freq: Three times a day (TID) | ORAL | Status: AC | PRN
  Administered 2024-03-31: 10 mg via ORAL
  Filled 2024-03-31: qty 1

## 2024-03-31 MED ORDER — MESALAMINE 1.2 G PO TBEC
1.2000 g | DELAYED_RELEASE_TABLET | Freq: Every day | ORAL | Status: DC
  Administered 2024-03-31 – 2024-04-01 (×2): 1.2 g via ORAL
  Filled 2024-03-31 (×2): qty 1

## 2024-03-31 MED ORDER — MORPHINE SULFATE (PF) 2 MG/ML IV SOLN
2.0000 mg | INTRAVENOUS | Status: DC | PRN
  Administered 2024-03-31 – 2024-04-01 (×2): 2 mg via INTRAVENOUS
  Filled 2024-03-31 (×2): qty 1

## 2024-03-31 MED ORDER — SODIUM CHLORIDE 0.9 % IV SOLN
2.0000 g | Freq: Every day | INTRAVENOUS | Status: DC
  Administered 2024-03-31: 2 g via INTRAVENOUS
  Filled 2024-03-31: qty 20

## 2024-03-31 MED ORDER — SODIUM CHLORIDE 0.9 % IV SOLN: INTRAVENOUS | Status: DC

## 2024-03-31 NOTE — Progress Notes (Signed)
 OT Cancellation Note  Patient Details Name: Erin Wolf MRN: [id number] DOB: April 24, 1956   Cancelled Treatment:    Reason Eval/Treat Not Completed: OT screened, no needs identified, will sign off Patient MI for ADLs at this time OT to s/o. Wynette Heckler, MS Acute Rehabilitation Department Office# [phone]'  03/31/2024, 12:56 PM

## 2024-03-31 NOTE — Consult Note (Addendum)
 [facility] Consult  Referring Provider: [facility]/Dr. Hendrick Locke Primary Care Physician:  Murry Art Primary Gastroenterologist: [facility]  Reason for Consultation: Abdominal pain, nausea, vomiting, diarrhea  HPI: Erin Wolf is a 68 y.o. female with Struve ulcerative colitis presented to the ER with worsening nausea, vomiting, diarrhea and abdominal pain.  She had dismissed from [facility] on 12/15/2023 due to multiple no-shows(more than 10 no-shows and cancellations) and has an upcoming colonoscopy at [facility] on 04/08/2024.  Patient states that she has intermittently been on Rowasa  since over 15-20 years periodically but denies being on oral mesalamine . She struggles between diarrhea and constipation and takes MiraLAX  3-4 times a week.  She has not noted any blood in stool recently.  However, on Friday, 6 days ago she developed nausea, vomiting and diarrhea which consisted of mucus like stool with some blood streaks.  She came to the ER, was given Zofran  and Dilaudid  and discharged home.  She did not have any improvement in the symptoms and decided to come to the ER again yesterday.  Patient states that her throat is very irritated and she has been spitting some blood. She states her nausea and vomiting are under control and would like her diet to be advanced. She does complain of mild lower abdominal pain. She complains of mild acid reflux for which she takes Nexium at home but denies difficulty swallowing or pain on swallowing.  She denies sick contacts, recent travel or recent antibiotic use.  As  Last seen by Dr. Veronda Goody for colonoscopy in 9/23  Previous GI workup: Colonoscopy 9/23, Dr. Veronda Goody: Chronic inactive pancolitis, 2 small tubular adenomas were removed, repeat was recommended in 2 years.  Colonoscopy 2020, Dr. Elsie Halo: Chronic inactive left-sided colitis, repeat recommended in 5 years  2012:  Biopsies from hepatic flexure showed focal active colitis, biopsies from ascending colon and random colon normal 2014: Biopsies from right colon, transverse and left colon normal 2015: Biopsies from random colon showed queisant chronic colitis   Colonoscopy, Dr. Elsie Halo 2017: Minimally active chronic colitis noted from sigmoid otherwise biopsies from transverse and right colon normal Past Medical History:  Diagnosis Date   Anxiety    Arthritis    Chronic abdominal pain    Chronic lower back pain    Chronic nausea    Depression    Diabetes mellitus without complication (HCC)    not on meds   Diverticulosis    Dyspnea    with exertion   Frequency of urination    GERD (gastroesophageal reflux disease)    Headache    Hematuria    Hepatitis    pt denies   Hepatitis C antibody positive in blood    per pt told by pcp   History of panic attacks    History of syncope    hx recurrent syncope --- per epic domentation non-cardiac , orthostatic hypotension, anxiety, dehydration   History of uterine fibroid    HTN (hypertension), benign    Hyperlipidemia 01/03/2012   IBS (irritable bowel syndrome)    Myalgia    Sciatic nerve disease, left 2023   Seizure disorder (HCC) followed by pcp until new neurologist appr. in jan 2019   started having them in my 20's ,  petit mal ----  per pt last seizure one 2015   SLE (systemic lupus erythematosus) [facility])    rheumatologist-- dr ziolkowska (consult 11-12-2016) having work-up done   Ulcerative colitis    followed by dr Elsie Halo  at [facility]   Wears glasses     Past Surgical History:  Procedure Laterality Date   ABDOMINAL HYSTERECTOMY  1988   ARTERY BIOPSY Left 06/11/2022   Procedure: LEFT BIOPSY TEMPORAL ARTERY;  Surgeon: Dorrie Gaudier, Alphonso Aschoff, MD;  Location: [facility];  Service: General;  Laterality: Left;   CHOLECYSTECTOMY OPEN  1978   COLONOSCOPY N/A 05/31/2013   Procedure: COLONOSCOPY;  Surgeon: Celedonio Coil, MD;  Location: [facility] ENDOSCOPY;   Service: Endoscopy;  Laterality: N/A;   CYSTOSCOPY/RETROGRADE/URETEROSCOPY Bilateral 07/23/2017   Procedure: CYSTOSCOPY/RETROGRADE/URETEROSCOPY;  Surgeon: Adelbert Homans, MD;  Location: [facility];  Service: Urology;  Laterality: Bilateral;   detached retina on right eye surgery      ESOPHAGOGASTRODUODENOSCOPY (EGD) WITH PROPOFOL  N/A 05/11/2014   Procedure: ESOPHAGOGASTRODUODENOSCOPY (EGD) WITH PROPOFOL ;  Surgeon: Yvetta Herbert, MD;  Location: [facility];  Service: Endoscopy;  Laterality: N/A;  egd first   ESOPHAGOGASTRODUODENOSCOPY (EGD) WITH PROPOFOL  N/A 05/08/2020   Procedure: ESOPHAGOGASTRODUODENOSCOPY (EGD) WITH PROPOFOL ;  Surgeon: Evangeline Hilts, MD;  Location: [facility];  Service: Endoscopy;  Laterality: N/A;   EUS N/A 06/21/2016   Procedure: ESOPHAGEAL ENDOSCOPIC ULTRASOUND (EUS) RADIAL;  Surgeon: Evangeline Hilts, MD;  Location: [facility];  Service: Endoscopy;  Laterality: N/A;   FLEXIBLE SIGMOIDOSCOPY N/A 05/11/2014   Procedure: FLEXIBLE SIGMOIDOSCOPY;  Surgeon: Yvetta Herbert, MD;  Location: [facility];  Service: Endoscopy;  Laterality: N/A;   LAPAROTOMY W/ BILATERAL SALPINGOOPHORECTOMY  09-26-2003   dr Steve El at [facility]   LAPROSCOPY W/ LYSIS ADHESIONS  06-08-2003   dr Cloretta [facility]   TOTAL HIP ARTHROPLASTY Left 12/03/2022   Procedure: TOTAL HIP ARTHROPLASTY ANTERIOR APPROACH;  Surgeon: Claiborne Crew, MD;  Location: [facility];  Service: Orthopedics;  Laterality: Left;   TOTAL KNEE ARTHROPLASTY Left 12/19/2020   Procedure: TOTAL KNEE ARTHROPLASTY;  Surgeon: Claiborne Crew, MD;  Location: [facility];  Service: Orthopedics;  Laterality: Left;  70 mins   TOTAL KNEE ARTHROPLASTY Right 03/27/2021   Procedure: TOTAL KNEE ARTHROPLASTY;  Surgeon: Claiborne Crew, MD;  Location: [facility];  Service: Orthopedics;  Laterality: Right;  70 mins   TRANSTHORACIC ECHOCARDIOGRAM  04/29/2016   ef 60-65%,  grade 2 diastolic dysfunction/  trivial MR and TR   TUBAL LIGATION  Bilateral yrs ago   UPPER ESOPHAGEAL ENDOSCOPIC ULTRASOUND (EUS) N/A 05/08/2020   Procedure: UPPER ESOPHAGEAL ENDOSCOPIC ULTRASOUND (EUS);  Surgeon: Evangeline Hilts, MD;  Location: [facility];  Service: Endoscopy;  Laterality: N/A;    Prior to Admission medications   Medication Sig Start Date End Date Taking? Authorizing Provider  FLUoxetine  (PROZAC ) 20 MG capsule Take 20 mg by mouth daily.    Yes [provider]  linaclotide (LINZESS) 72 MCG capsule Take 72 mcg by mouth daily as needed (constipation).   Yes [provider]  Multiple Vitamins-Minerals (CENTRUM WOMEN) TABS Take 1 tablet by mouth daily with breakfast.   Yes [provider]  naloxone (NARCAN) nasal spray 4 mg/0.1 mL Place 1 spray into the nose once as needed (opioid overdose).   Yes [provider]  Oxycodone  HCl 20 MG TABS Take 20 mg by mouth every 4 (four) hours as needed (Pain).   Yes [provider]  Polyethyl Glycol-Propyl Glycol (SYSTANE OP) Place 1 drop into both eyes 2 (two) times daily as needed (dry eyes).   Yes [provider]  Semaglutide (OZEMPIC, 1 MG/DOSE, [place]) Inject 1 mg into the skin every 7 (seven) days.   Yes [provider]    Current Facility-Administered  Medications  Medication Dose Route Frequency Provider Last Rate Last Admin   0.9 %  sodium chloride  infusion   Intravenous Continuous Maury Space, Kshitiz, MD 100 mL/hr at 03/31/24 0818 Restarted at 03/31/24 0818   ciprofloxacin  (CIPRO ) IVPB 400 mg  400 mg Intravenous Q12H Doutova, Anastassia, MD 200 mL/hr at 03/31/24 0439 400 mg at 03/31/24 0439   And   metroNIDAZOLE  (FLAGYL ) IVPB 500 mg  500 mg Intravenous Q12H Doutova, Anastassia, MD 100 mL/hr at 03/31/24 0555 500 mg at 03/31/24 0555   fentaNYL  (SUBLIMAZE ) injection 50 mcg  50 mcg Intravenous Q2H PRN Doutova, Anastassia, MD   50 mcg at 03/31/24 0439   FLUoxetine  (PROZAC ) capsule 20 mg  20 mg Oral Daily Doutova, Anastassia, MD   20 mg at 03/31/24  [id number]   insulin  aspart (novoLOG ) injection 0-9 Units  0-9 Units Subcutaneous Q4H Doutova, Anastassia, MD       ondansetron  (ZOFRAN ) tablet 4 mg  4 mg Oral Q6H PRN Doutova, Anastassia, MD       Or   ondansetron  (ZOFRAN ) injection 4 mg  4 mg Intravenous Q6H PRN Doutova, Anastassia, MD   4 mg at 03/31/24 0230   oxyCODONE  (Oxy IR/ROXICODONE ) immediate release tablet 5 mg  5 mg Oral Q6H PRN Doutova, Anastassia, MD   5 mg at 03/31/24 0740   sodium chloride  flush (NS) 0.9 % injection 3 mL  3 mL Intravenous Once Doutova, Anastassia, MD        Allergies as of 03/30/2024 - Review Complete 03/30/2024  Allergen Reaction Noted   Celecoxib Nausea Only 05/07/2011   Gabapentin Other (See Comments) 11/19/2023   Latex Other (See Comments) 06/20/2016   Nsaids Other (See Comments) 06/29/2016   Penicillins Hives and Other (See Comments) 05/07/2011   Wellbutrin [bupropion hcl] Other (See Comments) 05/07/2011   Aspirin  Nausea Only and Other (See Comments) 05/07/2011   Darvocet [propoxyphene n-acetaminophen ] Nausea And Vomiting 05/07/2011   Ketorolac  tromethamine  Nausea And Vomiting 03/19/2016   Tape Hives 11/09/2013   Tylenol  [acetaminophen ] Nausea Only 10/22/2014   Butalbital-apap-caffeine Other (See Comments) 01/24/2021   Ciprofloxacin  Other (See Comments) 01/24/2021   Eszopiclone Swelling 01/24/2021   Other Diarrhea and Other (See Comments) 08/01/2017   Topiramate  01/24/2021   Wound dressing adhesive Hives 11/09/2013    Family History  Problem Relation Age of Onset   Alzheimer's disease Mother    Hypertension Mother    Colon cancer Brother    Hypertension Brother    Other Father        ruptured appendix   Breast cancer Sister    Hypertension Sister    Hypertension Sister    Memory loss Brother    Hypertension Sister    Heart disease Sister     Social History   Socioeconomic History   Marital status: Married    Spouse name: Not on file   Number of children: 1   Years of education: 2  years college   Highest education level: Not on file  Occupational History   Occupation: Retired  Tobacco Use   Smoking status: Never   Smokeless tobacco: Never  Vaping Use   Vaping status: Never Used  Substance and Sexual Activity   Alcohol  use: Never    Comment: occ   Drug use: No   Sexual activity: Not Currently  Other Topics Concern   Not on file  Social History Narrative   Lives at home with husband.   Right-handed.   No caffeine use.   Social Drivers of  Health   Financial Resource Strain: Not on file  Food Insecurity: Patient Declined (03/30/2024)   Hunger Vital Sign    Worried About Running Out of Food in the Last Year: Patient declined    Ran Out of Food in the Last Year: Patient declined  Transportation Needs: Patient Declined (03/30/2024)   PRAPARE - Administrator, Civil Service (Medical): Patient declined    Lack of Transportation (Non-Medical): Patient declined  Physical Activity: Not on file  Stress: Not on file  Social Connections: Unknown (03/30/2024)   Social Connection and Isolation Panel [NHANES]    Frequency of Communication with Friends and Family: Patient declined    Frequency of Social Gatherings with Friends and Family: Patient declined    Attends Religious Services: Patient declined    Active Member of Clubs or Organizations: Patient declined    Attends Banker Meetings: Patient declined    Marital Status: Married  Catering manager Violence: Patient Declined (03/30/2024)   Humiliation, Afraid, Rape, and Kick questionnaire    Fear of Current or Ex-Partner: Patient declined    Emotionally Abused: Patient declined    Physically Abused: Patient declined    Sexually Abused: Patient declined    Review of Systems: Per HPI   Physical Exam: Vital signs in last 24 hours: Temp:  [97.6 F (36.4 C)-99.1 F (37.3 C)] 97.7 F (36.5 C) (06/11 0431) Pulse Rate:  [75-109] 88 (06/11 0431) Resp:  [14-25] 18 (06/11 0431) BP:  (100-154)/(77-108) 154/86 (06/11 0431) SpO2:  [97 %-100 %] 98 % (06/11 0431) Weight:  [102.1 kg-102.5 kg] 102.5 kg (06/10 1842) Last BM Date : 03/30/24  General:   Alert,  Well-developed, overweight, pleasant and cooperative in NAD Head:  Normocephalic and atraumatic. Eyes:  Sclera clear, no icterus.   Conjunctiva pink. Ears:  Normal auditory acuity. Nose:  No deformity, discharge,  or lesions. Mouth:  No deformity or lesions.  Oropharynx pink & moist. Neck:  Supple; no masses or thyromegaly. Lungs:  Clear throughout to auscultation.   No wheezes, crackles, or rhonchi. No acute distress. Heart:  Regular rate and rhythm; no murmurs, clicks, rubs,  or gallops. Extremities:  Without clubbing or edema. Neurologic:  Alert and  oriented x4;  grossly normal neurologically. Skin:  Intact without significant lesions or rashes. Psych:  Alert and cooperative. Normal mood and affect. Abdomen:  Soft, nontender and nondistended. No masses, hepatosplenomegaly or hernias noted. Normal bowel sounds, without guarding, and without rebound.         Lab Results: Recent Labs    03/30/24 1933 03/31/24 0052 03/31/24 0504  WBC 4.5 4.6 5.1  HGB 13.5 13.0 13.3  HCT 43.7 42.4 43.4  PLT 317 323 331   BMET Recent Labs    03/30/24 1205 03/30/24 1933 03/31/24 0504  NA 138 136 134*  K 3.3* 3.8 4.1  CL 102 105 104  CO2 22 21* 21*  GLUCOSE 97 107* 118*  BUN 7* 8 10  CREATININE 0.67 0.68 0.52  CALCIUM  9.6 9.1 8.9   LFT Recent Labs    03/31/24 0504  PROT 7.6  ALBUMIN 3.2*  AST 14*  ALT 13  ALKPHOS 53  BILITOT 0.5   PT/INR No results for input(s): LABPROT, INR in the last 72 hours.  Studies/Results: CT ABDOMEN PELVIS W CONTRAST Result Date: 03/30/2024 CLINICAL DATA:  Nausea, vomiting, abdominal pain, bloody diarrhea. EXAM: CT ABDOMEN AND PELVIS WITH CONTRAST TECHNIQUE: Multidetector CT imaging of the abdomen and pelvis was performed using the  standard protocol following bolus  administration of intravenous contrast. RADIATION DOSE REDUCTION: This exam was performed according to the departmental dose-optimization program which includes automated exposure control, adjustment of the mA and/or kV according to patient size and/or use of iterative reconstruction technique. CONTRAST:  OMNIPAQUE  IOHEXOL  300 MG/ML  SOLN COMPARISON:  January 02, 2024. FINDINGS: Lower chest: No acute abnormality. Hepatobiliary: Stable intrahepatic and extrahepatic biliary dilatation is noted most likely due to post cholecystectomy status. Hepatic parenchyma is otherwise unremarkable. Pancreas: Stable pancreatic ductal dilatation is also noted. No acute inflammation is noted. Spleen: Normal in size without focal abnormality. Adrenals/Urinary Tract: Stable left adrenal adenoma. Right adrenal gland is unremarkable. No hydronephrosis or renal obstruction is noted. Urinary bladder appears to be decompressed. Stomach/Bowel: Stomach is unremarkable. The appendix is unremarkable. There is no evidence of bowel obstruction. Possible mild diverticulitis of proximal sigmoid colon is noted. Vascular/Lymphatic: No significant vascular findings are present. No enlarged abdominal or pelvic lymph nodes. Reproductive: Status post hysterectomy. No adnexal masses. Other: No ascites or hernia is noted. Musculoskeletal: Status post left total hip arthroplasty. No acute osseous abnormality is noted. IMPRESSION: Possible mild proximal sigmoid diverticulitis. No definite abscess formation is noted. Electronically Signed   By: Rosalene Colon M.D.   On: 03/30/2024 14:53    Impression: Nausea, vomiting, diarrhea?  Symptoms of acute gastroenteritis  Mucus stools with streaks of blood, history of ulcerative colitis not on any maintenance medication, history of noncompliance  CT evidence of mild proximal sigmoid diverticulitis on IV Cipro  and IV Flagyl  and  Mild hyponatremia, 134 Mild acidosis, 21 Mild malnutrition, albumin  3.2  Dry throat, complains of spitting blood.  Plan: Stool samples ordered for C. difficile and GI pathogen panel, patient has not had a bowel movement since admission.  I will start patient on mesalamine  1.2 g once a day which will likely need to be continued as an outpatient in discussion with the GI at [facility].  Start on full liquid diet for lunch and if tolerated, advance to regular diet for the evening.  If stable ok to discharge in a.m.    LOS: 0 days   Genell Ken, MD  03/31/2024, 8:50 AM

## 2024-03-31 NOTE — Progress Notes (Signed)
 Patient c/o having mucus mixed with some blood and also patient is anxious. Notified to on call, ordered x1 dose of benadryl  and Atarax . Pt has not sleep all night. Administered Fentanyl  several times for her pain.

## 2024-03-31 NOTE — Evaluation (Signed)
 Physical Therapy Evaluation-1x Patient Details Name: Erin Wolf MRN: [id number] DOB: 09/17/1956 Today's Date: 03/31/2024  History of Present Illness  68 yo female admitted with acute diverticulitis, n/v/d. Hx of Hep C, Sz, ulcerative colitis, TKA, fibromyalgia, L THA, DM, cholecystectomy  Clinical Impression  On eval, pt was Mod Ind with mobility. She walked ~200 feet with a RW. Pt tolerated session well. No acute PT needs. Will sign off. Recommend daily hallway ambulation with either family or staff as tolerated.         If plan is discharge home, recommend the following: Help with stairs or ramp for entrance;Assist for transportation;Assistance with cooking/housework   Can travel by private vehicle        Equipment Recommendations None recommended by PT  Recommendations for Other Services       Functional Status Assessment Patient has had a recent decline in their functional status and demonstrates the ability to make significant improvements in function in a reasonable and predictable amount of time.     Precautions / Restrictions Restrictions Weight Bearing Restrictions Per Provider Order: No      Mobility  Bed Mobility               General bed mobility comments: oob in recliner    Transfers Overall transfer level: Modified independent                      Ambulation/Gait Ambulation/Gait assistance: Modified independent (Device/Increase time) Gait Distance (Feet): 200 Feet Assistive device: Rolling walker (2 wheels)         General Gait Details: tolerated distance well. no lob with rw use. pt also walked a short distance in the room without a device  Stairs            Wheelchair Mobility     Tilt Bed    Modified Rankin (Stroke Patients Only)       Balance Overall balance assessment: Mild deficits observed, not formally tested                                           Pertinent Vitals/Pain Pain  Assessment Pain Assessment: Faces Faces Pain Scale: Hurts even more Pain Location: hips-chronic Pain Intervention(s): Monitored during session    Home Living Family/patient expects to be discharged to:: Private residence Living Arrangements: Spouse/significant other Available Help at Discharge: Family;Available 24 hours/day Type of Home: House Home Access: Stairs to enter Entrance Stairs-Rails: Left;Right;Can reach both Entrance Stairs-Number of Steps: 4   Home Layout: One level Home Equipment: Grab bars - tub/shower;Rolling Walker (2 wheels);Cane - single point      Prior Function Prior Level of Function : Independent/Modified Independent             Mobility Comments: uses AD prn       Extremity/Trunk Assessment   Upper Extremity Assessment Upper Extremity Assessment: Overall WFL for tasks assessed    Lower Extremity Assessment Lower Extremity Assessment: Generalized weakness    Cervical / Trunk Assessment Cervical / Trunk Assessment: Normal  Communication   Communication Communication: No apparent difficulties    Cognition Arousal: Alert Behavior During Therapy: WFL for tasks assessed/performed   PT - Cognitive impairments: No apparent impairments                         Following commands: Intact  Cueing Cueing Techniques: Verbal cues     General Comments      Exercises     Assessment/Plan    PT Assessment Patient does not need any further PT services  PT Problem List         PT Treatment Interventions      PT Goals (Current goals can be found in the Care Plan section)  Acute Rehab PT Goals Patient Stated Goal: home PT Goal Formulation: All assessment and education complete, DC therapy    Frequency       Co-evaluation               AM-PAC PT 6 Clicks Mobility  Outcome Measure Help needed turning from your back to your side while in a flat bed without using bedrails?: None Help needed moving from lying  on your back to sitting on the side of a flat bed without using bedrails?: None Help needed moving to and from a bed to a chair (including a wheelchair)?: None Help needed standing up from a chair using your arms (e.g., wheelchair or bedside chair)?: None Help needed to walk in hospital room?: None Help needed climbing 3-5 steps with a railing? : A Little 6 Click Score: 23    End of Session   Activity Tolerance: Patient tolerated treatment well Patient left: in chair;with call bell/phone within reach        Time: 1102-1119 PT Time Calculation (min) (ACUTE ONLY): 17 min   Charges:   PT Evaluation $PT Eval Low Complexity: 1 Low   PT General Charges $$ ACUTE PT VISIT: 1 Visit            Tanda Falter, PT Acute Rehabilitation  Office: [phone]

## 2024-03-31 NOTE — Plan of Care (Signed)
   Problem: Clinical Measurements: Goal: Will remain free from infection Outcome: Progressing Goal: Diagnostic test results will improve Outcome: Progressing

## 2024-03-31 NOTE — Progress Notes (Signed)
 PROGRESS NOTE    Erin Wolf  ZOX:[id number] DOB: 1956/05/15 DOA: 03/30/2024 PCP: Chares Commons, PA-C   Brief Narrative:  68 year old female with history of hypertension, hyperlipidemia, ulcerative colitis presented with worsening abdominal pain with some nausea and vomiting and fever.  On presenting, CT of abdomen and pelvis with contrast showed mild proximal sigmoid diverticulitis with no abscess.  She was started on IV fluids and antibiotics.  GI was consulted.  Assessment & Plan:   Acute sigmoid diverticulitis -CT as above.  Continue IV antibiotics. - GI has been consulted.  Diet advancement as per GI.  Continue IV fluids, analgesics and antiemetics as needed -Stool testing has been ordered: no diarrhea since admission  Lower GI bleeding has been ruled out - No bowel movement since admission.  Chronic ulcerative colitis -Continue mesalamine .  Follow GI recommendations.  Outpatient follow-up with GI.  Dehydration - Improving with IV fluids.  Hypertension - Blood pressure intermittently elevated.  Monitor.  Hypokalemia -Improved  Acute metabolic acidosis -Mild.  Encourage oral intake.  Monitor  Hyponatremia - Mild.  Monitor.  Continue IV fluids.  Obesity class I - Outpatient follow-up  Diabetes mellitus type 2--patient refusing CBGs with SSI.  Will DC CBGs with SSI.  Blood sugars stable.  Ozempic on hold.    DVT prophylaxis: SCDs Code Status: Full Family Communication: None at bedside Disposition Plan: Status is: Observation The patient will require care spanning > 2 midnights and should be moved to inpatient because: Of severity of illness.  Need for IV antibiotics.    Consultants: GI  Procedures: None  Antimicrobials: Ciprofloxacin  and Flagyl  from 03/30/2024 onwards   Subjective: Patient seen and examined at bedside.  Feels little better.  Still having intermittent abdominal pain with nausea.  No fever, shortness of breath  reported.  Objective: Vitals:   03/30/24 2016 03/31/24 0026 03/31/24 0431 03/31/24 1007  BP: 124/79 (!) 143/108 (!) 154/86 (!) 148/90  Pulse: 80 84 88 88  Resp: 17 17 18 16   Temp: 99.1 F (37.3 C) 97.6 F (36.4 C) 97.7 F (36.5 C) 98.1 F (36.7 C)  TempSrc: Oral Oral Oral Oral  SpO2: 97% 100% 98% 100%  Weight:      Height:        Intake/Output Summary (Last 24 hours) at 03/31/2024 1010 Last data filed at 03/31/2024 1000 Gross per 24 hour  Intake 1959.69 ml  Output 100 ml  Net 1859.69 ml   Filed Weights   03/30/24 1203 03/30/24 1842  Weight: 102.1 kg 102.5 kg    Examination:  General exam: Appears calm and comfortable  Respiratory system: Bilateral decreased breath sounds at bases, no wheezing Cardiovascular system: S1 & S2 heard, Rate controlled Gastrointestinal system: Abdomen is obese, nondistended, soft and mildly tender. Normal bowel sounds heard. Extremities: No cyanosis, clubbing, edema  Central nervous system: Alert and oriented. No focal neurological deficits. Moving extremities Skin: No rashes, lesions or ulcers Psychiatry: Judgement and insight appear normal. Mood & affect appropriate.     Data Reviewed: I have personally reviewed following labs and imaging studies  CBC: Recent Labs  Lab 03/28/24 1038 03/30/24 1205 03/30/24 1933 03/31/24 0052 03/31/24 0504  WBC 5.3 6.1 4.5 4.6 5.1  HGB 13.7 13.2 13.5 13.0 13.3  HCT 43.6 42.0 43.7 42.4 43.4  MCV 83.5 82.4 85.7 85.0 85.9  PLT 302 318 317 323 331   Basic Metabolic Panel: Recent Labs  Lab 03/28/24 1038 03/30/24 1205 03/30/24 1933 03/31/24 0504  NA 137 138 136  134*  K 3.5 3.3* 3.8 4.1  CL 104 102 105 104  CO2 23 22 21* 21*  GLUCOSE 123* 97 107* 118*  BUN 11 7* 8 10  CREATININE 0.60 0.67 0.68 0.52  CALCIUM  9.2 9.6 9.1 8.9  MG  --   --  2.0 2.1  PHOS  --   --  3.8 3.0   GFR: Estimated Creatinine Clearance: 78.4 mL/min (by C-G formula based on SCr of 0.52 mg/dL). Liver Function  Tests: Recent Labs  Lab 03/28/24 1038 03/30/24 1205 03/30/24 1933 03/31/24 0504  AST 20 18 17  14*  ALT 17 13 16 13   ALKPHOS 63 68 57 53  BILITOT 0.5 0.3 0.6 0.5  PROT 7.9 7.8 7.7 7.6  ALBUMIN 3.8 4.1 3.6 3.2*   Recent Labs  Lab 03/28/24 1038 03/30/24 1205  LIPASE 29 22   No results for input(s): AMMONIA in the last 168 hours. Coagulation Profile: No results for input(s): INR, PROTIME in the last 168 hours. Cardiac Enzymes: Recent Labs  Lab 03/30/24 1933  CKTOTAL 64   BNP (last 3 results) No results for input(s): PROBNP in the last 8760 hours. HbA1C: No results for input(s): HGBA1C in the last 72 hours. CBG: Recent Labs  Lab 03/30/24 2014 03/31/24 0022 03/31/24 0428 03/31/24 0758  GLUCAP 128* 143* 111* 85   Lipid Profile: No results for input(s): CHOL, HDL, LDLCALC, TRIG, CHOLHDL, LDLDIRECT in the last 72 hours. Thyroid  Function Tests: Recent Labs    03/30/24 1933  TSH 1.939   Anemia Panel: No results for input(s): VITAMINB12, FOLATE, FERRITIN, TIBC, IRON, RETICCTPCT in the last 72 hours. Sepsis Labs: No results for input(s): PROCALCITON, LATICACIDVEN in the last 168 hours.  No results found for this or any previous visit (from the past 240 hours).       Radiology Studies: CT ABDOMEN PELVIS W CONTRAST Result Date: 03/30/2024 CLINICAL DATA:  Nausea, vomiting, abdominal pain, bloody diarrhea. EXAM: CT ABDOMEN AND PELVIS WITH CONTRAST TECHNIQUE: Multidetector CT imaging of the abdomen and pelvis was performed using the standard protocol following bolus administration of intravenous contrast. RADIATION DOSE REDUCTION: This exam was performed according to the departmental dose-optimization program which includes automated exposure control, adjustment of the mA and/or kV according to patient size and/or use of iterative reconstruction technique. CONTRAST:  OMNIPAQUE  IOHEXOL  300 MG/ML  SOLN COMPARISON:  January 02, 2024.  FINDINGS: Lower chest: No acute abnormality. Hepatobiliary: Stable intrahepatic and extrahepatic biliary dilatation is noted most likely due to post cholecystectomy status. Hepatic parenchyma is otherwise unremarkable. Pancreas: Stable pancreatic ductal dilatation is also noted. No acute inflammation is noted. Spleen: Normal in size without focal abnormality. Adrenals/Urinary Tract: Stable left adrenal adenoma. Right adrenal gland is unremarkable. No hydronephrosis or renal obstruction is noted. Urinary bladder appears to be decompressed. Stomach/Bowel: Stomach is unremarkable. The appendix is unremarkable. There is no evidence of bowel obstruction. Possible mild diverticulitis of proximal sigmoid colon is noted. Vascular/Lymphatic: No significant vascular findings are present. No enlarged abdominal or pelvic lymph nodes. Reproductive: Status post hysterectomy. No adnexal masses. Other: No ascites or hernia is noted. Musculoskeletal: Status post left total hip arthroplasty. No acute osseous abnormality is noted. IMPRESSION: Possible mild proximal sigmoid diverticulitis. No definite abscess formation is noted. Electronically Signed   By: Rosalene Colon M.D.   On: 03/30/2024 14:53        Scheduled Meds:  FLUoxetine   20 mg Oral Daily   mesalamine   1.2 g Oral Daily   sodium chloride   flush  3 mL Intravenous Once   Continuous Infusions:  sodium chloride  100 mL/hr at 03/31/24 0818   ciprofloxacin  400 mg (03/31/24 0439)   And   metronidazole  500 mg (03/31/24 0555)          Audria Leather, MD [facility] 03/31/2024, 10:10 AM

## 2024-03-31 NOTE — Plan of Care (Signed)

## 2024-04-01 DIAGNOSIS — K5792 Diverticulitis of intestine, part unspecified, without perforation or abscess without bleeding: Secondary | ICD-10-CM | POA: Diagnosis not present

## 2024-04-01 LAB — CBC WITH DIFFERENTIAL/PLATELET
Abs Immature Granulocytes: 0.02 10*3/uL (ref 0.00–0.07)
Basophils Absolute: 0 10*3/uL (ref 0.0–0.1)
Basophils Relative: 0 %
Eosinophils Absolute: 0 10*3/uL (ref 0.0–0.5)
Eosinophils Relative: 0 %
HCT: 39 % (ref 36.0–46.0)
Hemoglobin: 11.8 g/dL — ABNORMAL LOW (ref 12.0–15.0)
Immature Granulocytes: 0 %
Lymphocytes Relative: 56 %
Lymphs Abs: 3.2 10*3/uL (ref 0.7–4.0)
MCH: 26.2 pg (ref 26.0–34.0)
MCHC: 30.3 g/dL (ref 30.0–36.0)
MCV: 86.7 fL (ref 80.0–100.0)
Monocytes Absolute: 0.5 10*3/uL (ref 0.1–1.0)
Monocytes Relative: 9 %
Neutro Abs: 2.1 10*3/uL (ref 1.7–7.7)
Neutrophils Relative %: 35 %
Platelets: 278 10*3/uL (ref 150–400)
RBC: 4.5 MIL/uL (ref 3.87–5.11)
RDW: 14.9 % (ref 11.5–15.5)
WBC: 5.8 10*3/uL (ref 4.0–10.5)
nRBC: 0 % (ref 0.0–0.2)

## 2024-04-01 LAB — BASIC METABOLIC PANEL WITH GFR
Anion gap: 11 (ref 5–15)
BUN: 12 mg/dL (ref 8–23)
CO2: 20 mmol/L — ABNORMAL LOW (ref 22–32)
Calcium: 8.2 mg/dL — ABNORMAL LOW (ref 8.9–10.3)
Chloride: 106 mmol/L (ref 98–111)
Creatinine, Ser: 0.54 mg/dL (ref 0.44–1.00)
GFR, Estimated: >60 mL/min (ref >60)
Glucose, Bld: 89 mg/dL (ref 70–99)
Potassium: 3.8 mmol/L (ref 3.5–5.1)
Sodium: 137 mmol/L (ref 135–145)

## 2024-04-01 LAB — HEMOGLOBIN A1C
Hgb A1c MFr Bld: 6.4 % — ABNORMAL HIGH (ref 4.8–5.6)
Mean Plasma Glucose: 137 mg/dL

## 2024-04-01 LAB — MAGNESIUM: Magnesium: 1.9 mg/dL (ref 1.7–2.4)

## 2024-04-01 LAB — C-REACTIVE PROTEIN: CRP: <0.5 mg/dL (ref <1.0)

## 2024-04-01 MED ORDER — MESALAMINE 1.2 G PO TBEC: 1.2000 g | DELAYED_RELEASE_TABLET | Freq: Every day | ORAL | 0 refills | Status: DC

## 2024-04-01 MED ORDER — HYDROXYZINE HCL 10 MG PO TABS: 10.0000 mg | ORAL_TABLET | Freq: Three times a day (TID) | ORAL | 0 refills | Status: DC | PRN

## 2024-04-01 MED ORDER — ONDANSETRON HCL 4 MG PO TABS
4.0000 mg | ORAL_TABLET | Freq: Four times a day (QID) | ORAL | 0 refills | Status: DC | PRN
Start: 2024-04-01 — End: 2024-07-25

## 2024-04-01 MED ORDER — AMOXICILLIN-POT CLAVULANATE 875-125 MG PO TABS: 1.0000 | ORAL_TABLET | Freq: Two times a day (BID) | ORAL | 0 refills | Status: AC

## 2024-04-01 NOTE — Progress Notes (Signed)
   04/01/24 1033  TOC Brief Assessment  Insurance and Status Reviewed  Patient has primary care physician Yes  Home environment has been reviewed home with spouse  Prior level of function: independent  Prior/Current Home Services No current home services  Social Drivers of Health Review SDOH reviewed no interventions necessary  Readmission risk has been reviewed Yes  Transition of care needs no transition of care needs at this time

## 2024-04-01 NOTE — Discharge Summary (Addendum)
 Physician Discharge Summary  Erin Wolf BJY:[id number] DOB: Nov 21, 1955 DOA: 03/30/2024  PCP: Chares Commons, PA-C  Admit date: 03/30/2024 Discharge date: 04/01/2024  Admitted From: Home Disposition: Home  Recommendations for Outpatient Follow-up:  Follow up with PCP in 1 week with repeat CBC/BMP Outpatient follow-up with GI Follow up in ED if symptoms worsen or new appear   Home Health: No Equipment/Devices: None  Discharge Condition: Stable CODE STATUS: Full Diet recommendation: Regular  Brief/Interim Summary: 68 year old female with history of hypertension, hyperlipidemia, ulcerative colitis presented with worsening abdominal pain with some nausea and vomiting and fever. On presenting, CT of abdomen and pelvis with contrast showed mild proximal sigmoid diverticulitis with no abscess. She was started on IV fluids and antibiotics. GI was consulted.  During hospitalization, her condition is improved.  She is tolerating regular diet currently with improved abdominal pain and nausea.  She wants to go home today.  She will be discharged home today on oral Augmentin  to finish 10-day course of therapy.  She has been started on mesalamine  by GI which should be continued.  Outpatient follow-up with PCP and GI.  Discharge Diagnoses:   Acute sigmoid diverticulitis -CT as above.  Continue IV antibiotics. - GI evaluated the patient during this hospitalization: Patient has been started on mesalamine .  -During hospitalization, her condition is improved.  She is tolerating regular diet currently with improved abdominal pain and nausea.  She wants to go home today.  She will be discharged home today on oral Augmentin  to finish 10-day course of therapy.  Continue mesalamine .  Outpatient follow-up with PCP and GI. -Diarrhea: Stool testing has been canceled, evaluated by   Lower GI bleeding has been ruled out   Chronic ulcerative colitis -Continue mesalamine .  Outpatient follow-up with  GI.   Dehydration - Improved with IV fluids.   Hypertension - Blood pressure stable.  Outpatient follow-up   Hypokalemia -Improved   Acute metabolic acidosis -Mild.  Encourage oral intake.  Outpatient follow-up  Hyponatremia - Improved   Obesity class I - Outpatient follow-up   Diabetes mellitus type 2 --Carb modified diet.  Resume Ozempic.  Chronic pain with opiate dependence - Continue as needed oxycodone .  Outpatient follow-up with PCP/pain management.  Mild malnutrition has been ruled out   Discharge Instructions  Discharge Instructions     Diet general   Complete by: As directed    Increase activity slowly   Complete by: As directed       Allergies as of 04/01/2024       Reactions   Celecoxib Nausea Only   Gabapentin Other (See Comments)   Abdominal pain   Latex Other (See Comments)   Peels skin Other Reaction(s): other latex   Nsaids Other (See Comments)   HAS COLITIS FLARES Non-steroidal anti-inflammatory agent (product)   Penicillins Hives, Other (See Comments)   Tolerates cefazolin , ceftriaxone , cephalexin , AND Augmentin    Wellbutrin [bupropion Hcl] Other (See Comments)   Insomnia and headaches   Aspirin  Nausea Only, Other (See Comments)   GI Intolerance   Darvocet [propoxyphene N-acetaminophen ] Nausea And Vomiting   Ketorolac  Tromethamine  Nausea And Vomiting   Other Reaction(s): Not available ketorolac  tromethamine    Tape Hives   Tylenol  [acetaminophen ] Nausea Only   Butalbital-apap-caffeine Other (See Comments)   Stomach upset   Ciprofloxacin  Other (See Comments)   Stomach upset Other Reaction(s): Not available ciprofloxacin    Eszopiclone Swelling   Other Diarrhea, Other (See Comments)   Spicy foods cause diarrhea due to colitis  Topiramate    stomach upset Other Reaction(s): Not available topiramate   Wound Dressing Adhesive Hives        Medication List     TAKE these medications    amoxicillin -clavulanate 875-125 MG  tablet Commonly known as: AUGMENTIN  Take 1 tablet by mouth 2 (two) times daily for 10 days.   Centrum Women Tabs Take 1 tablet by mouth daily with breakfast.   FLUoxetine  20 MG capsule Commonly known as: PROZAC  Take 20 mg by mouth daily.   hydrOXYzine  10 MG tablet Commonly known as: ATARAX  Take 1 tablet (10 mg total) by mouth 3 (three) times daily as needed for anxiety or itching.   Linzess 72 MCG capsule Generic drug: linaclotide Take 72 mcg by mouth daily as needed (constipation).   mesalamine  1.2 g EC tablet Commonly known as: LIALDA  Take 1 tablet (1.2 g total) by mouth daily. Start taking on: April 02, 2024   Narcan 4 MG/0.1ML Liqd nasal spray kit Generic drug: naloxone Place 1 spray into the nose once as needed (opioid overdose).   ondansetron  4 MG tablet Commonly known as: ZOFRAN  Take 1 tablet (4 mg total) by mouth every 6 (six) hours as needed for nausea.   Oxycodone  HCl 20 MG Tabs Take 20 mg by mouth every 4 (four) hours as needed (Pain).   OZEMPIC (1 MG/DOSE) [place] Inject 1 mg into the skin every 7 (seven) days.   SYSTANE OP Place 1 drop into both eyes 2 (two) times daily as needed (dry eyes).          Follow-up Information     Chares Commons, PA-C. Schedule an appointment as soon as possible for a visit in 1 week(s).   Specialty: Physician Assistant Contact information: [address] [place], [place] [address] [phone]                Allergies  Allergen Reactions   Celecoxib Nausea Only   Gabapentin Other (See Comments)    Abdominal pain   Latex Other (See Comments)    Peels skin  Other Reaction(s): other  latex   Nsaids Other (See Comments)    HAS COLITIS FLARES  Non-steroidal anti-inflammatory agent (product)   Penicillins Hives and Other (See Comments)    Tolerates cefazolin , ceftriaxone , cephalexin , AND Augmentin    Wellbutrin [Bupropion Hcl] Other (See Comments)    Insomnia and headaches   Aspirin  Nausea Only and  Other (See Comments)    GI Intolerance   Darvocet [Propoxyphene N-Acetaminophen ] Nausea And Vomiting   Ketorolac  Tromethamine  Nausea And Vomiting    Other Reaction(s): Not available  ketorolac  tromethamine    Tape Hives   Tylenol  [Acetaminophen ] Nausea Only   Butalbital-Apap-Caffeine Other (See Comments)    Stomach upset   Ciprofloxacin  Other (See Comments)    Stomach upset  Other Reaction(s): Not available  ciprofloxacin    Eszopiclone Swelling   Other Diarrhea and Other (See Comments)    Spicy foods cause diarrhea due to colitis   Topiramate     stomach upset  Other Reaction(s): Not available  topiramate   Wound Dressing Adhesive Hives    Consultations: GI   Procedures/Studies: CT ABDOMEN PELVIS W CONTRAST Result Date: 03/30/2024 CLINICAL DATA:  Nausea, vomiting, abdominal pain, bloody diarrhea. EXAM: CT ABDOMEN AND PELVIS WITH CONTRAST TECHNIQUE: Multidetector CT imaging of the abdomen and pelvis was performed using the standard protocol following bolus administration of intravenous contrast. RADIATION DOSE REDUCTION: This exam was performed according to the departmental dose-optimization program which includes automated exposure  control, adjustment of the mA and/or kV according to patient size and/or use of iterative reconstruction technique. CONTRAST:  OMNIPAQUE  IOHEXOL  300 MG/ML  SOLN COMPARISON:  January 02, 2024. FINDINGS: Lower chest: No acute abnormality. Hepatobiliary: Stable intrahepatic and extrahepatic biliary dilatation is noted most likely due to post cholecystectomy status. Hepatic parenchyma is otherwise unremarkable. Pancreas: Stable pancreatic ductal dilatation is also noted. No acute inflammation is noted. Spleen: Normal in size without focal abnormality. Adrenals/Urinary Tract: Stable left adrenal adenoma. Right adrenal gland is unremarkable. No hydronephrosis or renal obstruction is noted. Urinary bladder appears to be decompressed. Stomach/Bowel: Stomach  is unremarkable. The appendix is unremarkable. There is no evidence of bowel obstruction. Possible mild diverticulitis of proximal sigmoid colon is noted. Vascular/Lymphatic: No significant vascular findings are present. No enlarged abdominal or pelvic lymph nodes. Reproductive: Status post hysterectomy. No adnexal masses. Other: No ascites or hernia is noted. Musculoskeletal: Status post left total hip arthroplasty. No acute osseous abnormality is noted. IMPRESSION: Possible mild proximal sigmoid diverticulitis. No definite abscess formation is noted. Electronically Signed   By: Rosalene Colon M.D.   On: 03/30/2024 14:53      Subjective: Patient seen and examined at bedside.  Feels much better, tolerating diet, denies worsening abdominal pain.  Wants to go home today.  No fever, shortness of breath reported.  Discharge Exam: Vitals:   03/31/24 2040 04/01/24 0525  BP: 115/67 128/72  Pulse: 89 80  Resp: 18 18  Temp: 98.6 F (37 C) 98.4 F (36.9 C)  SpO2: 99% 98%    General: Pt is alert, awake, not in acute distress, on room air Cardiovascular: rate controlled, S1/S2 + Respiratory: bilateral decreased breath sounds at bases Abdominal: Soft, obese, NT, ND, bowel sounds + Extremities: no edema, no cyanosis    The results of significant diagnostics from this hospitalization (including imaging, microbiology, ancillary and laboratory) are listed below for reference.     Microbiology: No results found for this or any previous visit (from the past 240 hours).   Labs: BNP (last 3 results) Recent Labs    05/16/23 1744  BNP 9.6   Basic Metabolic Panel: Recent Labs  Lab 03/28/24 1038 03/30/24 1205 03/30/24 1933 03/31/24 0504 04/01/24 0505  NA 137 138 136 134* 137  K 3.5 3.3* 3.8 4.1 3.8  CL 104 102 105 104 106  CO2 23 22 21* 21* 20*  GLUCOSE 123* 97 107* 118* 89  BUN 11 7* 8 10 12   CREATININE 0.60 0.67 0.68 0.52 0.54  CALCIUM  9.2 9.6 9.1 8.9 8.2*  MG  --   --  2.0 2.1 1.9   PHOS  --   --  3.8 3.0  --    Liver Function Tests: Recent Labs  Lab 03/28/24 1038 03/30/24 1205 03/30/24 1933 03/31/24 0504  AST 20 18 17  14*  ALT 17 13 16 13   ALKPHOS 63 68 57 53  BILITOT 0.5 0.3 0.6 0.5  PROT 7.9 7.8 7.7 7.6  ALBUMIN 3.8 4.1 3.6 3.2*   Recent Labs  Lab 03/28/24 1038 03/30/24 1205  LIPASE 29 22   No results for input(s): AMMONIA in the last 168 hours. CBC: Recent Labs  Lab 03/30/24 1205 03/30/24 1933 03/31/24 0052 03/31/24 0504 04/01/24 0505  WBC 6.1 4.5 4.6 5.1 5.8  NEUTROABS  --   --   --   --  2.1  HGB 13.2 13.5 13.0 13.3 11.8*  HCT 42.0 43.7 42.4 43.4 39.0  MCV 82.4 85.7 85.0 85.9  86.7  PLT 318 317 323 331 278   Cardiac Enzymes: Recent Labs  Lab 03/30/24 1933  CKTOTAL 64   BNP: Invalid input(s): POCBNP CBG: Recent Labs  Lab 03/30/24 2014 03/31/24 0022 03/31/24 0428 03/31/24 0758  GLUCAP 128* 143* 111* 85   D-Dimer No results for input(s): DDIMER in the last 72 hours. Hgb A1c Recent Labs    03/30/24 1929  HGBA1C 6.4*   Lipid Profile No results for input(s): CHOL, HDL, LDLCALC, TRIG, CHOLHDL, LDLDIRECT in the last 72 hours. Thyroid  function studies Recent Labs    03/30/24 1933  TSH 1.939   Anemia work up No results for input(s): VITAMINB12, FOLATE, FERRITIN, TIBC, IRON, RETICCTPCT in the last 72 hours. Urinalysis    Component Value Date/Time   COLORURINE AMBER (A) 03/30/2024 1209   APPEARANCEUR CLOUDY (A) 03/30/2024 1209   LABSPEC 1.010 03/30/2024 1209   PHURINE 6.0 03/30/2024 1209   GLUCOSEU NEGATIVE 03/30/2024 1209   HGBUR LARGE (A) 03/30/2024 1209   BILIRUBINUR NEGATIVE 03/30/2024 1209   KETONESUR NEGATIVE 03/30/2024 1209   PROTEINUR NEGATIVE 03/30/2024 1209   UROBILINOGEN 0.2 04/17/2015 0644   NITRITE NEGATIVE 03/30/2024 1209   LEUKOCYTESUR NEGATIVE 03/30/2024 1209   Sepsis Labs Recent Labs  Lab 03/30/24 1933 03/31/24 0052 03/31/24 0504 04/01/24 0505  WBC 4.5 4.6  5.1 5.8   Microbiology No results found for this or any previous visit (from the past 240 hours).   Time coordinating discharge: 35 minutes  SIGNED:   Audria Leather, MD  [facility] 04/01/2024, 9:20 AM

## 2024-04-27 ENCOUNTER — Emergency Department (HOSPITAL_COMMUNITY)

## 2024-04-27 ENCOUNTER — Other Ambulatory Visit: Payer: Self-pay

## 2024-04-27 ENCOUNTER — Inpatient Hospital Stay (HOSPITAL_COMMUNITY)
Admission: EM | Admit: 2024-04-27 | Discharge: 2024-04-29 | DRG: 392 | Disposition: A | Discharge status: Home / Self Care | Attending: Family Medicine | Admitting: Family Medicine

## 2024-04-27 ENCOUNTER — Encounter (HOSPITAL_COMMUNITY): Payer: Self-pay | Admitting: *Deleted

## 2024-04-27 DIAGNOSIS — K5190 Ulcerative colitis, unspecified, without complications: Secondary | ICD-10-CM | POA: Diagnosis present

## 2024-04-27 DIAGNOSIS — E119 Type 2 diabetes mellitus without complications: Secondary | ICD-10-CM | POA: Diagnosis present

## 2024-04-27 DIAGNOSIS — Z91040 Latex allergy status: Secondary | ICD-10-CM | POA: Diagnosis not present

## 2024-04-27 DIAGNOSIS — Z881 Allergy status to other antibiotic agents status: Secondary | ICD-10-CM

## 2024-04-27 DIAGNOSIS — R1032 Left lower quadrant pain: Secondary | ICD-10-CM | POA: Diagnosis present

## 2024-04-27 DIAGNOSIS — Z6837 Body mass index (BMI) 37.0-37.9, adult: Secondary | ICD-10-CM

## 2024-04-27 DIAGNOSIS — M329 Systemic lupus erythematosus, unspecified: Secondary | ICD-10-CM | POA: Diagnosis present

## 2024-04-27 DIAGNOSIS — K5732 Diverticulitis of large intestine without perforation or abscess without bleeding: Principal | ICD-10-CM | POA: Diagnosis present

## 2024-04-27 DIAGNOSIS — Z79899 Other long term (current) drug therapy: Secondary | ICD-10-CM

## 2024-04-27 DIAGNOSIS — Z96642 Presence of left artificial hip joint: Secondary | ICD-10-CM | POA: Diagnosis present

## 2024-04-27 DIAGNOSIS — I10 Essential (primary) hypertension: Secondary | ICD-10-CM | POA: Diagnosis present

## 2024-04-27 DIAGNOSIS — Z91048 Other nonmedicinal substance allergy status: Secondary | ICD-10-CM

## 2024-04-27 DIAGNOSIS — G40909 Epilepsy, unspecified, not intractable, without status epilepticus: Secondary | ICD-10-CM | POA: Diagnosis present

## 2024-04-27 DIAGNOSIS — Z7985 Long-term (current) use of injectable non-insulin antidiabetic drugs: Secondary | ICD-10-CM

## 2024-04-27 DIAGNOSIS — Z96653 Presence of artificial knee joint, bilateral: Secondary | ICD-10-CM | POA: Diagnosis present

## 2024-04-27 DIAGNOSIS — Z803 Family history of malignant neoplasm of breast: Secondary | ICD-10-CM

## 2024-04-27 DIAGNOSIS — E66812 Obesity, class 2: Secondary | ICD-10-CM | POA: Diagnosis present

## 2024-04-27 DIAGNOSIS — F419 Anxiety disorder, unspecified: Secondary | ICD-10-CM | POA: Diagnosis present

## 2024-04-27 DIAGNOSIS — Z91018 Allergy to other foods: Secondary | ICD-10-CM

## 2024-04-27 DIAGNOSIS — Z886 Allergy status to analgesic agent status: Secondary | ICD-10-CM

## 2024-04-27 DIAGNOSIS — Z8249 Family history of ischemic heart disease and other diseases of the circulatory system: Secondary | ICD-10-CM | POA: Diagnosis not present

## 2024-04-27 DIAGNOSIS — Z880 Allergy status to penicillin: Secondary | ICD-10-CM

## 2024-04-27 DIAGNOSIS — Z820 Family history of epilepsy and other diseases of the nervous system: Secondary | ICD-10-CM

## 2024-04-27 DIAGNOSIS — R4589 Other symptoms and signs involving emotional state: Secondary | ICD-10-CM | POA: Diagnosis present

## 2024-04-27 DIAGNOSIS — Z800 Family history of malignant neoplasm of digestive organs: Secondary | ICD-10-CM

## 2024-04-27 DIAGNOSIS — R7401 Elevation of levels of liver transaminase levels: Secondary | ICD-10-CM | POA: Diagnosis present

## 2024-04-27 DIAGNOSIS — F32A Depression, unspecified: Secondary | ICD-10-CM | POA: Diagnosis present

## 2024-04-27 DIAGNOSIS — E785 Hyperlipidemia, unspecified: Secondary | ICD-10-CM | POA: Diagnosis present

## 2024-04-27 DIAGNOSIS — Z888 Allergy status to other drugs, medicaments and biological substances status: Secondary | ICD-10-CM

## 2024-04-27 DIAGNOSIS — K5792 Diverticulitis of intestine, part unspecified, without perforation or abscess without bleeding: Secondary | ICD-10-CM | POA: Diagnosis present

## 2024-04-27 DIAGNOSIS — G894 Chronic pain syndrome: Secondary | ICD-10-CM | POA: Diagnosis present

## 2024-04-27 LAB — COMPREHENSIVE METABOLIC PANEL WITH GFR
ALT: 20 U/L (ref 0–44)
AST: 17 U/L (ref 15–41)
Albumin: 3.7 g/dL (ref 3.5–5.0)
Alkaline Phosphatase: 68 U/L (ref 38–126)
Anion gap: 10 (ref 5–15)
BUN: 12 mg/dL (ref 8–23)
CO2: 24 mmol/L (ref 22–32)
Calcium: 9.5 mg/dL (ref 8.9–10.3)
Chloride: 106 mmol/L (ref 98–111)
Creatinine, Ser: 0.6 mg/dL (ref 0.44–1.00)
GFR, Estimated: >60 mL/min (ref >60)
Glucose, Bld: 88 mg/dL (ref 70–99)
Potassium: 3.7 mmol/L (ref 3.5–5.1)
Sodium: 140 mmol/L (ref 135–145)
Total Bilirubin: 0.2 mg/dL (ref 0.0–1.2)
Total Protein: 8.2 g/dL — ABNORMAL HIGH (ref 6.5–8.1)

## 2024-04-27 LAB — CBC WITH DIFFERENTIAL/PLATELET
Abs Immature Granulocytes: 0 K/uL (ref 0.00–0.07)
Basophils Absolute: 0 K/uL (ref 0.0–0.1)
Basophils Relative: 0 %
Eosinophils Absolute: 0 K/uL (ref 0.0–0.5)
Eosinophils Relative: 0 %
HCT: 44 % (ref 36.0–46.0)
Hemoglobin: 13.8 g/dL (ref 12.0–15.0)
Immature Granulocytes: 0 %
Lymphocytes Relative: 51 %
Lymphs Abs: 2.5 K/uL (ref 0.7–4.0)
MCH: 26 pg (ref 26.0–34.0)
MCHC: 31.4 g/dL (ref 30.0–36.0)
MCV: 82.9 fL (ref 80.0–100.0)
Monocytes Absolute: 0.5 K/uL (ref 0.1–1.0)
Monocytes Relative: 10 %
Neutro Abs: 2 K/uL (ref 1.7–7.7)
Neutrophils Relative %: 39 %
Platelets: 347 K/uL (ref 150–400)
RBC: 5.31 MIL/uL — ABNORMAL HIGH (ref 3.87–5.11)
RDW: 15.3 % (ref 11.5–15.5)
WBC: 5 K/uL (ref 4.0–10.5)
nRBC: 0 % (ref 0.0–0.2)

## 2024-04-27 LAB — CBC
HCT: 44.2 % (ref 36.0–46.0)
Hemoglobin: 13.7 g/dL (ref 12.0–15.0)
MCH: 26.1 pg (ref 26.0–34.0)
MCHC: 31 g/dL (ref 30.0–36.0)
MCV: 84.4 fL (ref 80.0–100.0)
Platelets: 304 K/uL (ref 150–400)
RBC: 5.24 MIL/uL — ABNORMAL HIGH (ref 3.87–5.11)
RDW: 15.4 % (ref 11.5–15.5)
WBC: 4.7 K/uL (ref 4.0–10.5)
nRBC: 0 % (ref 0.0–0.2)

## 2024-04-27 LAB — CREATININE, SERUM
Creatinine, Ser: 0.59 mg/dL (ref 0.44–1.00)
GFR, Estimated: >60 mL/min (ref >60)

## 2024-04-27 LAB — URINALYSIS, ROUTINE W REFLEX MICROSCOPIC
Bilirubin Urine: NEGATIVE
Glucose, UA: NEGATIVE mg/dL
Hgb urine dipstick: NEGATIVE
Ketones, ur: NEGATIVE mg/dL
Leukocytes,Ua: NEGATIVE
Nitrite: NEGATIVE
Protein, ur: NEGATIVE mg/dL
Specific Gravity, Urine: 1.023 (ref 1.005–1.030)
pH: 5 (ref 5.0–8.0)

## 2024-04-27 LAB — LIPASE, BLOOD: Lipase: 30 U/L (ref 11–51)

## 2024-04-27 MED ORDER — ONDANSETRON HCL 4 MG/2ML IJ SOLN
4.0000 mg | Freq: Three times a day (TID) | INTRAMUSCULAR | Status: DC | PRN
  Administered 2024-04-27: 4 mg via INTRAVENOUS
  Filled 2024-04-27: qty 2

## 2024-04-27 MED ORDER — LISINOPRIL 5 MG PO TABS
5.0000 mg | ORAL_TABLET | Freq: Every day | ORAL | Status: DC
  Administered 2024-04-27 – 2024-04-28 (×2): 5 mg via ORAL
  Filled 2024-04-27 (×2): qty 1

## 2024-04-27 MED ORDER — PIPERACILLIN-TAZOBACTAM 3.375 G IVPB 30 MIN: 3.3750 g | Freq: Four times a day (QID) | INTRAVENOUS | Status: DC

## 2024-04-27 MED ORDER — ACETAMINOPHEN 500 MG PO TABS: 1000.0000 mg | ORAL_TABLET | Freq: Four times a day (QID) | ORAL | Status: DC | PRN

## 2024-04-27 MED ORDER — HYDROXYZINE HCL 10 MG PO TABS: 10.0000 mg | ORAL_TABLET | Freq: Three times a day (TID) | ORAL | Status: DC | PRN

## 2024-04-27 MED ORDER — ZOLPIDEM TARTRATE 5 MG PO TABS
5.0000 mg | ORAL_TABLET | Freq: Every evening | ORAL | Status: DC | PRN
  Administered 2024-04-28: 5 mg via ORAL
  Filled 2024-04-27 (×2): qty 1

## 2024-04-27 MED ORDER — NALOXONE HCL 4 MG/0.1ML NA LIQD: 1.0000 | Freq: Once | NASAL | Status: DC | PRN

## 2024-04-27 MED ORDER — ENOXAPARIN SODIUM 40 MG/0.4ML IJ SOSY
40.0000 mg | PREFILLED_SYRINGE | INTRAMUSCULAR | Status: DC
  Administered 2024-04-27: 40 mg via SUBCUTANEOUS
  Filled 2024-04-27: qty 0.4

## 2024-04-27 MED ORDER — LORAZEPAM 1 MG PO TABS
1.0000 mg | ORAL_TABLET | Freq: Once | ORAL | Status: AC
  Administered 2024-04-27: 1 mg via ORAL
  Filled 2024-04-27: qty 1

## 2024-04-27 MED ORDER — EZETIMIBE 10 MG PO TABS
10.0000 mg | ORAL_TABLET | Freq: Every day | ORAL | Status: DC
  Administered 2024-04-28 – 2024-04-29 (×2): 10 mg via ORAL
  Filled 2024-04-27 (×2): qty 1

## 2024-04-27 MED ORDER — LORAZEPAM 2 MG/ML IJ SOLN
0.5000 mg | Freq: Once | INTRAMUSCULAR | Status: AC
  Administered 2024-04-27: 0.5 mg via INTRAVENOUS
  Filled 2024-04-27: qty 1

## 2024-04-27 MED ORDER — MORPHINE SULFATE (PF) 2 MG/ML IV SOLN
2.0000 mg | INTRAVENOUS | Status: DC | PRN
  Administered 2024-04-27 – 2024-04-28 (×5): 2 mg via INTRAVENOUS
  Filled 2024-04-27 (×5): qty 1

## 2024-04-27 MED ORDER — HYDRALAZINE HCL 20 MG/ML IJ SOLN: 10.0000 mg | Freq: Four times a day (QID) | INTRAMUSCULAR | Status: DC | PRN

## 2024-04-27 MED ORDER — IOHEXOL 300 MG/ML  SOLN
80.0000 mL | Freq: Once | INTRAMUSCULAR | Status: AC | PRN
  Administered 2024-04-27: 80 mL via INTRAVENOUS

## 2024-04-27 MED ORDER — FENTANYL CITRATE PF 50 MCG/ML IJ SOSY
50.0000 ug | PREFILLED_SYRINGE | INTRAMUSCULAR | Status: AC | PRN
  Administered 2024-04-27 (×2): 50 ug via INTRAVENOUS
  Filled 2024-04-27 (×2): qty 1

## 2024-04-27 MED ORDER — PIPERACILLIN-TAZOBACTAM 3.375 G IVPB 30 MIN
3.3750 g | Freq: Once | INTRAVENOUS | Status: AC
  Administered 2024-04-27: 3.375 g via INTRAVENOUS
  Filled 2024-04-27: qty 50

## 2024-04-27 MED ORDER — SODIUM CHLORIDE 0.9 % IV SOLN
8.0000 mg | Freq: Four times a day (QID) | INTRAVENOUS | Status: DC | PRN
  Administered 2024-04-28: 8 mg via INTRAVENOUS
  Filled 2024-04-27: qty 4

## 2024-04-27 MED ORDER — FLUOXETINE HCL 20 MG PO CAPS
20.0000 mg | ORAL_CAPSULE | Freq: Every day | ORAL | Status: DC
  Administered 2024-04-28 – 2024-04-29 (×2): 20 mg via ORAL
  Filled 2024-04-27 (×2): qty 1

## 2024-04-27 MED ORDER — NALOXONE HCL 0.4 MG/ML IJ SOLN: 0.4000 mg | INTRAMUSCULAR | Status: DC | PRN

## 2024-04-27 MED ORDER — PIPERACILLIN-TAZOBACTAM 3.375 G IVPB
3.3750 g | Freq: Three times a day (TID) | INTRAVENOUS | Status: DC
  Administered 2024-04-27 – 2024-04-29 (×5): 3.375 g via INTRAVENOUS
  Filled 2024-04-27 (×5): qty 50

## 2024-04-27 MED ORDER — ONDANSETRON HCL 4 MG PO TABS
8.0000 mg | ORAL_TABLET | Freq: Four times a day (QID) | ORAL | Status: DC | PRN
Start: 2024-04-27 — End: 2024-04-29
  Administered 2024-04-28 – 2024-04-29 (×3): 8 mg via ORAL
  Filled 2024-04-27 (×4): qty 2

## 2024-04-27 MED ORDER — OXYCODONE HCL 5 MG PO TABS
20.0000 mg | ORAL_TABLET | ORAL | Status: DC | PRN
  Administered 2024-04-27 – 2024-04-29 (×7): 20 mg via ORAL
  Filled 2024-04-27 (×7): qty 4

## 2024-04-27 NOTE — ED Provider Notes (Signed)
 [place] EMERGENCY DEPARTMENT AT [facility] Provider Note   CSN: [id number] Arrival date & time: 04/27/24  1107     Patient presents with: Abdominal Pain   Erin Wolf is a 68 y.o. female.    Abdominal Pain Associated symptoms: diarrhea, fatigue, fever, nausea and vomiting      68 year old female with medical history significant for ulcerative colitis, HLD, SLE, GERD, anxiety, diverticulosis, HTN, seizures, DM2 presenting to the emergency department with abdominal pain.  The patient was last treated for diverticulitis 1 month ago with a course of antibiotics.  She developed fever to 101 on Sunday and has had nausea, vomiting, looser mucousy stools no blood in stools, right sided abdominal pain.  She describes the pain as intermittent, intermittently 10 out of 10 in severity.  She has been unable to keep much down and has had decreased oral intake over that same time and feels like she is not managing her symptoms at home currently.  She is in between GI physicians and is in the process of getting outpatient follow-up for her ulcerative colitis and diverticulitis history.  She states that her brother died of metastatic colon cancer and she is eager to get an expedited colonoscopy workup.  Prior to Admission medications   Medication Sig Start Date End Date Taking? Authorizing Provider  FLUoxetine  (PROZAC ) 20 MG capsule Take 20 mg by mouth daily.     [provider]  hydrOXYzine  (ATARAX ) 10 MG tablet Take 1 tablet (10 mg total) by mouth 3 (three) times daily as needed for anxiety or itching. 04/01/24   Cheryle Page, MD  linaclotide (LINZESS) 72 MCG capsule Take 72 mcg by mouth daily as needed (constipation).    [provider]  mesalamine  (LIALDA ) 1.2 g EC tablet Take 1 tablet (1.2 g total) by mouth daily. 04/02/24   Cheryle Page, MD  Multiple Vitamins-Minerals (CENTRUM WOMEN) TABS Take 1 tablet by mouth daily with breakfast.    [provider]   naloxone  (NARCAN ) nasal spray 4 mg/0.1 mL Place 1 spray into the nose once as needed (opioid overdose).    [provider]  ondansetron  (ZOFRAN ) 4 MG tablet Take 1 tablet (4 mg total) by mouth every 6 (six) hours as needed for nausea. 04/01/24   Cheryle Page, MD  Oxycodone  HCl 20 MG TABS Take 20 mg by mouth every 4 (four) hours as needed (Pain).    [provider]  Polyethyl Glycol-Propyl Glycol (SYSTANE OP) Place 1 drop into both eyes 2 (two) times daily as needed (dry eyes).    [provider]  Semaglutide (OZEMPIC, 1 MG/DOSE, [place]) Inject 1 mg into the skin every 7 (seven) days.    [provider]    Allergies: Celecoxib, Gabapentin, Latex, Nsaids, Penicillins, Wellbutrin [bupropion hcl], Aspirin , Darvocet [propoxyphene n-acetaminophen ], Ketorolac  tromethamine , Tape, Tylenol  [acetaminophen ], Butalbital-apap-caffeine, Ciprofloxacin , Eszopiclone, Other, Topiramate, and Wound dressing adhesive    Review of Systems  Constitutional:  Positive for fatigue and fever.  Gastrointestinal:  Positive for abdominal pain, diarrhea, nausea and vomiting.  All other systems reviewed and are negative.   Updated Vital Signs BP (!) 155/98   Pulse 88   Temp 97.8 F (36.6 C)   Resp 16   Ht 5' 4 (1.626 m)   Wt 99.8 kg   SpO2 100%   BMI 37.76 kg/m   Physical Exam Vitals and nursing note reviewed.  Constitutional:      General: She is not in acute distress.    Appearance:  She is well-developed. She is obese.  HENT:     Head: Normocephalic and atraumatic.  Eyes:     Conjunctiva/sclera: Conjunctivae normal.  Cardiovascular:     Rate and Rhythm: Normal rate and regular rhythm.     Heart sounds: No murmur heard. Pulmonary:     Effort: Pulmonary effort is normal. No respiratory distress.     Breath sounds: Normal breath sounds.  Abdominal:     Palpations: Abdomen is soft.     Tenderness: There is abdominal tenderness in the right upper quadrant and right lower  quadrant. There is guarding.  Musculoskeletal:        General: No swelling.     Cervical back: Neck supple.  Skin:    General: Skin is warm and dry.     Capillary Refill: Capillary refill takes less than 2 seconds.  Neurological:     Mental Status: She is alert.  Psychiatric:        Mood and Affect: Mood normal.     (all labs ordered are listed, but only abnormal results are displayed) Labs Reviewed  COMPREHENSIVE METABOLIC PANEL WITH GFR - Abnormal; Notable for the following components:      Result Value   Total Protein 8.2 (*)    All other components within normal limits  URINALYSIS, ROUTINE W REFLEX MICROSCOPIC - Abnormal; Notable for the following components:   APPearance HAZY (*)    Bacteria, UA RARE (*)    All other components within normal limits  CBC WITH DIFFERENTIAL/PLATELET - Abnormal; Notable for the following components:   RBC 5.31 (*)    All other components within normal limits  LIPASE, BLOOD    EKG: None  Radiology: CT ABDOMEN PELVIS W CONTRAST Result Date: 04/27/2024 CLINICAL DATA:  Diverticulitis, complication suspected. EXAM: CT ABDOMEN AND PELVIS WITH CONTRAST TECHNIQUE: Multidetector CT imaging of the abdomen and pelvis was performed using the standard protocol following bolus administration of intravenous contrast. RADIATION DOSE REDUCTION: This exam was performed according to the departmental dose-optimization program which includes automated exposure control, adjustment of the mA and/or kV according to patient size and/or use of iterative reconstruction technique. CONTRAST:  80mL OMNIPAQUE  IOHEXOL  300 MG/ML  SOLN COMPARISON:  CT scan abdomen pelvis from 03/30/2024. FINDINGS: Lower chest: The lung bases are clear. No pleural effusion. The heart is normal in size. No pericardial effusion. Hepatobiliary: The liver is normal in size. Non-cirrhotic configuration. No suspicious mass. These is mild diffuse hepatic steatosis. No intrahepatic bile duct dilation. There  is mild prominence of the extrahepatic bile duct, most likely due to post cholecystectomy status. Gallbladder is surgically absent. Pancreas: Unremarkable. No focal mass. No surrounding inflammatory changes. Redemonstration of mild dilation of main pancreatic duct, essentially similar to the prior study. No obstructing mass. Spleen: Within normal limits. No focal lesion. Adrenals/Urinary Tract: Adrenal glands are unremarkable. No suspicious renal mass. No hydronephrosis. No renal or ureteric calculi. 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 Stomach/Bowel: No disproportionate dilation of the small or large bowel loops. No evidence of abnormal bowel wall thickening or inflammatory changes. The appendix is unremarkable. Redemonstration of mild fat stranding surrounding the colon at the junction of the descending colon and proximal sigmoid colon. Underlying colon exhibits multiple diverticula. No abnormal wall thickening however, evaluation is limited due to underdistention. Findings may represent subacute/smoldering diverticulitis in appropriate clinical settings. No associated pneumoperitoneum, pneumatosis or portal venous gas. No walled-off abscess or loculated collection.  There are multiple additional diverticula throughout the colon without diverticulitis. Vascular/Lymphatic: No ascites or pneumoperitoneum. No abdominal or pelvic lymphadenopathy, by size criteria. No aneurysmal dilation of the major abdominal arteries. 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 Other: There is a tiny fat containing umbilical hernia. The soft tissues and abdominal wall are otherwise unremarkable. Musculoskeletal: No suspicious osseous lesions. There are mild - moderate multilevel degenerative changes in the visualized spine. IMPRESSION: 1. Redemonstration of mild fat  stranding surrounding the colon at the junction of the descending colon and proximal sigmoid colon. Underlying colon exhibits multiple diverticula. No abnormal wall thickening however, evaluation is limited due to underdistention. Findings may represent subacute/smoldering diverticulitis in appropriate clinical settings. No associated pneumoperitoneum, pneumatosis or portal venous gas. No walled-off abscess or loculated collection. 2. Multiple other nonacute observations, as described above. Electronically Signed   By: Ree Molt M.D.   On: 04/27/2024 15:15     Procedures   Medications Ordered in the ED  fentaNYL  (SUBLIMAZE ) injection 50 mcg (50 mcg Intravenous Given 04/27/24 1209)  ondansetron  (ZOFRAN ) injection 4 mg (4 mg Intravenous Given 04/27/24 1209)  LORazepam  (ATIVAN ) tablet 1 mg (has no administration in time range)  LORazepam  (ATIVAN ) injection 0.5 mg (0.5 mg Intravenous Given 04/27/24 1407)  iohexol  (OMNIPAQUE ) 300 MG/ML solution 80 mL (80 mLs Intravenous Contrast Given 04/27/24 1445)                                    Medical Decision Making Amount and/or Complexity of Data Reviewed Labs: ordered. Radiology: ordered.  Risk Prescription drug management. Decision regarding hospitalization.   68 year old female with medical history significant for ulcerative colitis, HLD, SLE, GERD, anxiety, diverticulosis, HTN, seizures, DM2 presenting to the emergency department with abdominal pain.  The patient was last treated for diverticulitis 1 month ago with a course of antibiotics.  She developed fever to 101 on Sunday and has had nausea, vomiting, looser mucousy stools no blood in stools, right sided abdominal pain.  She describes the pain as intermittent, intermittently 10 out of 10 in severity.  She has been unable to keep much down and has had decreased oral intake over that same time and feels like she is not managing her symptoms at home currently.  She is in between GI physicians and  is in the process of getting outpatient follow-up for her ulcerative colitis and diverticulitis history.  She states that her brother died of metastatic colon cancer and she is eager to get an expedited colonoscopy workup.  On arrival, the patient was afebrile, tachycardic heart rate 109, not tachypneic, saturating on room air, hypertensive BP 156/99.  On my evaluation, sinus rhythm was noted on cardiac telemetry.  Physical exam revealed abdominal tenderness to palpation, no rebound or guarding.  Concern for recurrent diverticulitis, considered other acute intra-abdominal emergency.  Labs: CBC without a leukocytosis or anemia, CMP unremarkable, lipase normal, urinalysis with rare bacteria, negative for UTI.  The patient was administered fentanyl , Zofran  and Ativan  for nausea and pain control.  CT Abd Pel: IMPRESSION:  1. Redemonstration of mild fat stranding surrounding the colon at  the junction of the descending colon and proximal sigmoid colon.  Underlying colon exhibits multiple diverticula. No abnormal wall  thickening however, evaluation is limited due to underdistention.  Findings may represent subacute/smoldering diverticulitis in  appropriate clinical settings. No associated pneumoperitoneum,  pneumatosis or portal  venous gas. No walled-off abscess or loculated  collection.  2. Multiple other nonacute observations, as described above.    Patient still in acute pain despite attempts at pain control, pt was administered IV Zosyn , plan for admission for pain control. Spoke with Dr. Rosalie, who felt that pt would not require colonoscopy during acute phase of illness, could follow-up in clinic for condiferation for outpatient colonoscopy. Hospitalist medicine consulted for admission for pain control.      Final diagnoses:  Diverticulitis of large intestine without perforation or abscess, unspecified bleeding status    ED Discharge Orders     None          Jerrol Agent,  MD 04/27/24 1551

## 2024-04-27 NOTE — Assessment & Plan Note (Signed)
 Patient very anxious about her health.  Very nervous about having colon cancer.  I explained to her that now is not the time to undergo colonoscopy given inflammation of the bowel walls.  She understands and plans to follow-up with her GI doctor and PCP.

## 2024-04-27 NOTE — ED Triage Notes (Signed)
 Here by POV from home for recurrent abd pain r/t colitis and diverticulitis. Seen here last month for the same. Has finished ABT. Pending GI appt. Endorses pain, NVD. V x3 and D x2 in last 24 hrs. Denies fever, sob, CP, back pain, or obvious bleeding. Describes BM as mucus. Rates pain as on and off, 10/10.

## 2024-04-27 NOTE — H&P (Signed)
 History and Physical    Patient: Erin Wolf FMW:[id number] DOB: 01/27/1956 DOA: 04/27/2024 DOS: the patient was seen and examined on 04/27/2024 PCP: Jerome Heron Ruth, PA-C  Patient coming from: Home  Chief Complaint:  Chief Complaint  Patient presents with   Abdominal Pain   HPI: SARRINAH GARDIN is a 68 y.o. female with medical history significant of ulcerative colitis, hyperlipidemia, anxiety, diverticulosis, hypertension, seizures, and prediabetes type II who presents to the emergency department with abdominal pain.  Pain began on Sunday when she developed a fever of 101 degrees and had associated nausea and vomiting and loose.  She had some associated right-sided abdominal pain but now the pain is worse on the left.  Pain is intermittently rated at 10 out of 10 in severity.  The pain comes and goes.  She has been unable to keep much food down and has had significantly decreased oral intake and is not managing her symptoms at home very well.  She has become recently very concerned about getting a colon opinion she is between GI doctors.  Her brother died in the 109s of a colon cancer at 83 and she is very eager to get a colonoscopy expedited.  She has an appointment on Friday with a GI doctor.  Evaluation in the emergency department was reassuring with a normal white blood cell count no significant electrolyte abnormalities, unremarkable urinalysis, and CT scanning which showed mild fat stranding surrounding the colon at the junction of the descending colon and the proximal sigmoid colon with underlying colon exhibiting multiple diverticula.  Findings suggestive of subacute or smoldering diverticulitis. She was referred to hospital medicine for admission due to abdominal pain related to acute diverticulitis without abscess or bleeding.   Review of Systems: As mentioned in the history of present illness. All other systems reviewed and are negative. Past Medical History:  Diagnosis  Date   Anxiety    Arthritis    Chronic abdominal pain    Chronic lower back pain    Chronic nausea    Depression    Diabetes mellitus without complication (HCC)    not on meds   Diverticulosis    Dyspnea    with exertion   Frequency of urination    GERD (gastroesophageal reflux disease)    Headache    Hematuria    Hepatitis    pt denies   Hepatitis C antibody positive in blood    per pt told by pcp   History of panic attacks    History of syncope    hx recurrent syncope --- per epic domentation non-cardiac , orthostatic hypotension, anxiety, dehydration   History of uterine fibroid    HTN (hypertension), benign    Hyperlipidemia 01/03/2012   IBS (irritable bowel syndrome)    Myalgia    Sciatic nerve disease, left 2023   Seizure disorder (HCC) followed by pcp until new neurologist appr. in jan 2019   started having them in my 20's ,  petit mal ----  per pt last seizure one 2015   SLE (systemic lupus erythematosus) [facility])    rheumatologist-- dr ziolkowska (consult 11-12-2016) having work-up done   Ulcerative colitis    followed by dr lennard at [facility]   Wears glasses    Past Surgical History:  Procedure Laterality Date   ABDOMINAL HYSTERECTOMY  1988   ARTERY BIOPSY Left 06/11/2022   Procedure: LEFT BIOPSY TEMPORAL ARTERY;  Surgeon: Stevie Herlene Righter, MD;  Location: [facility];  Service: General;  Laterality:  Left;   CHOLECYSTECTOMY OPEN  1978   COLONOSCOPY N/A 05/31/2013   Procedure: COLONOSCOPY;  Surgeon: Lesta JULIANNA Fitz, MD;  Location: [facility] ENDOSCOPY;  Service: Endoscopy;  Laterality: N/A;   CYSTOSCOPY/RETROGRADE/URETEROSCOPY Bilateral 07/23/2017   Procedure: CYSTOSCOPY/RETROGRADE/URETEROSCOPY;  Surgeon: Devere Lonni Righter, MD;  Location: [facility];  Service: Urology;  Laterality: Bilateral;   detached retina on right eye surgery      ESOPHAGOGASTRODUODENOSCOPY (EGD) WITH PROPOFOL  N/A 05/11/2014   Procedure: ESOPHAGOGASTRODUODENOSCOPY (EGD) WITH  PROPOFOL ;  Surgeon: Jerrell KYM Sol, MD;  Location: [facility];  Service: Endoscopy;  Laterality: N/A;  egd first   ESOPHAGOGASTRODUODENOSCOPY (EGD) WITH PROPOFOL  N/A 05/08/2020   Procedure: ESOPHAGOGASTRODUODENOSCOPY (EGD) WITH PROPOFOL ;  Surgeon: Burnette Fallow, MD;  Location: [facility];  Service: Endoscopy;  Laterality: N/A;   EUS N/A 06/21/2016   Procedure: ESOPHAGEAL ENDOSCOPIC ULTRASOUND (EUS) RADIAL;  Surgeon: Fallow Burnette, MD;  Location: [facility];  Service: Endoscopy;  Laterality: N/A;   FLEXIBLE SIGMOIDOSCOPY N/A 05/11/2014   Procedure: FLEXIBLE SIGMOIDOSCOPY;  Surgeon: Jerrell KYM Sol, MD;  Location: [facility];  Service: Endoscopy;  Laterality: N/A;   LAPAROTOMY W/ BILATERAL SALPINGOOPHORECTOMY  09-26-2003   dr johnnye at [facility]   LAPROSCOPY W/ LYSIS ADHESIONS  06-08-2003   dr leva [facility]   TOTAL HIP ARTHROPLASTY Left 12/03/2022   Procedure: TOTAL HIP ARTHROPLASTY ANTERIOR APPROACH;  Surgeon: Ernie Cough, MD;  Location: [facility];  Service: Orthopedics;  Laterality: Left;   TOTAL KNEE ARTHROPLASTY Left 12/19/2020   Procedure: TOTAL KNEE ARTHROPLASTY;  Surgeon: Ernie Cough, MD;  Location: [facility];  Service: Orthopedics;  Laterality: Left;  70 mins   TOTAL KNEE ARTHROPLASTY Right 03/27/2021   Procedure: TOTAL KNEE ARTHROPLASTY;  Surgeon: Ernie Cough, MD;  Location: [facility];  Service: Orthopedics;  Laterality: Right;  70 mins   TRANSTHORACIC ECHOCARDIOGRAM  04/29/2016   ef 60-65%,  grade 2 diastolic dysfunction/  trivial MR and TR   TUBAL LIGATION Bilateral yrs ago   UPPER ESOPHAGEAL ENDOSCOPIC ULTRASOUND (EUS) N/A 05/08/2020   Procedure: UPPER ESOPHAGEAL ENDOSCOPIC ULTRASOUND (EUS);  Surgeon: Burnette Fallow, MD;  Location: [facility];  Service: Endoscopy;  Laterality: N/A;   Social History:  reports that she has never smoked. She has never used smokeless tobacco. She reports that she does not drink alcohol  and does not use drugs.  Allergies  Allergen Reactions    Celecoxib Nausea Only   Gabapentin Other (See Comments)    Abdominal pain   Latex Other (See Comments)    Peels skin  Other Reaction(s): other  latex   Nsaids Other (See Comments)    HAS COLITIS FLARES  Non-steroidal anti-inflammatory agent (product)   Penicillins Hives and Other (See Comments)    Tolerates cefazolin , ceftriaxone , cephalexin , AND Augmentin    Wellbutrin [Bupropion Hcl] Other (See Comments)    Insomnia and headaches   Aspirin  Nausea Only and Other (See Comments)    GI Intolerance   Darvocet [Propoxyphene N-Acetaminophen ] Nausea And Vomiting   Ketorolac  Tromethamine  Nausea And Vomiting    Other Reaction(s): Not available  ketorolac  tromethamine    Tape Hives   Tylenol  [Acetaminophen ] Nausea Only   Butalbital-Apap-Caffeine Other (See Comments)    Stomach upset   Ciprofloxacin  Other (See Comments)    Stomach upset  Other Reaction(s): Not available  ciprofloxacin    Eszopiclone Swelling   Other Diarrhea and Other (See Comments)    Spicy foods cause diarrhea due to colitis   Topiramate     stomach upset  Other Reaction(s): Not available  topiramate   Wound Dressing Adhesive Hives    Family History  Problem Relation Age of Onset   Alzheimer's disease Mother    Hypertension Mother    Other Father        ruptured appendix   Breast cancer Sister    Hypertension Sister    Hypertension Sister    Hypertension Sister    Heart disease Sister    Colon cancer Brother    Hypertension Brother    Cancer Brother    Memory loss Brother     Prior to Admission medications   Medication Sig Start Date End Date Taking? Authorizing Provider  acetaminophen  (TYLENOL ) 500 MG tablet Take 1,000 mg by mouth every 6 (six) hours as needed for mild pain (pain score 1-3) or moderate pain (pain score 4-6).   Yes [provider]  ezetimibe  (ZETIA ) 10 MG tablet Take 10 mg by mouth in the morning. 04/07/24  Yes [provider]  FLUoxetine  (PROZAC ) 20 MG capsule  Take 20 mg by mouth in the morning.   Yes [provider]  hydrOXYzine  (ATARAX ) 10 MG tablet Take 1 tablet (10 mg total) by mouth 3 (three) times daily as needed for anxiety or itching. 04/01/24  Yes Cheryle Page, MD  linaclotide (LINZESS) 72 MCG capsule Take 72 mcg by mouth daily as needed (constipation).   Yes [provider]  Multiple Vitamins-Minerals (CENTRUM WOMEN) TABS Take 1 tablet by mouth daily with breakfast.   Yes [provider]  naloxone  (NARCAN ) nasal spray 4 mg/0.1 mL Place 1 spray into the nose once as needed (opioid overdose).   Yes [provider]  ondansetron  (ZOFRAN ) 4 MG tablet Take 1 tablet (4 mg total) by mouth every 6 (six) hours as needed for nausea. 04/01/24  Yes Cheryle Page, MD  Oxycodone  HCl 20 MG TABS Take 20 mg by mouth every 4 (four) hours as needed (Pain).   Yes [provider]  Polyethyl Glycol-Propyl Glycol (SYSTANE OP) Place 1 drop into both eyes 2 (two) times daily as needed (dry eyes).   Yes [provider]  Semaglutide (OZEMPIC, 1 MG/DOSE, [place]) Inject 1 mg into the skin every 7 (seven) days. On Sundays Patient not taking: Reported on 04/27/2024    [provider]    Physical Exam: Vitals:   04/27/24 1400 04/27/24 1415 04/27/24 1430 04/27/24 1505  BP: (!) 138/95 (!) 142/95 (!) 155/98 (!) 155/98  Pulse: 88 91 89 88  Resp:   16 16  Temp:    97.8 F (36.6 C)  TempSrc:      SpO2: 100% 99% 100% 100%  Weight:      Height:       Eyes: PERRL, lids and conjunctivae normal ENMT: Mucous membranes are moist. Posterior pharynx clear of any exudate or lesions.Normal dentition.  Neck: normal, supple, no masses, no thyromegaly Respiratory: clear to auscultation bilaterally, no wheezing, no crackles. Normal respiratory effort. No accessory muscle use.  Cardiovascular: Regular rate and rhythm, no murmurs / rubs / gallops. No extremity edema. 2+ pedal pulses. No carotid bruits.  Abdomen: Moderate left  lower quadrant tenderness, no masses palpated. No hepatosplenomegaly. Bowel sounds positive.  Musculoskeletal: no clubbing / cyanosis. No joint deformity upper and lower extremities. Good ROM, no contractures. Normal muscle tone.  Skin: no rashes, lesions, ulcers. No induration Neurologic: CN 2-12 grossly intact. Sensation intact, DTR normal. Strength 5/5 in all 4.  Psychiatric: Normal judgment and insight. Alert and oriented x 3. Normal mood.  Affect  anxious   Data Reviewed:  normal white blood cell count no significant electrolyte abnormalities, unremarkable urinalysis, and CT scanning which showed mild fat stranding surrounding the colon at the junction of the descending colon and the proximal sigmoid colon with underlying colon exhibiting multiple diverticula.  Findings suggestive of subacute or smoldering diverticulitis. BUN and creatinine normal.   Assessment and Plan: * Acute diverticulitis Likely cause of abdominal pain. She received 1 dose of Zosyn  in the emergency department will continue same Patient not a candidate and short-term for a colonoscopy given inflammation associated with diverticulitis.  Recommend she wait at least 6 weeks or until her GI doctor feels that it is safe to proceed.  She has an appointment on Friday with a GI doctor will likely not be able to make that appointment.  I recommended she call and postpone or have her PCP contact the GI office.  Abdominal pain, acute, left lower quadrant Diverticulitis causing left lower quadrant abdominal pain.  She is not tolerating it well at home despite oxycodone  20 mg p.o. every 4 hours.   - Will admit to hospital -Tylenol  for mild pain -Oxycodone  20 mg p.o. every 4 hours for moderate pain -Morphine  2 mg IV every 2 hours as needed severe pain -Gentle hydration -Regular diet as tolerated -GI was consulted in the ED by ED provider   Ulcerative colitis [facility]) Noted appears to be stable at present  Anxiety about  health Patient very anxious about her health.  Very nervous about having colon cancer.  I explained to her that now is not the time to undergo colonoscopy given inflammation of the bowel walls.  She understands and plans to follow-up with her GI doctor and PCP.  HTN (hypertension), benign Blood pressure is elevated in the emergency department and at last visit. -Will start lisinopril  5 mg p.o. daily given patient has prediabetes -Monitor blood pressures -As needed hydralazine  for elevated pressures      Advance Care Planning:   Code Status: Full Code   Consults: GI discussed with ED physician in the emergency department.  Family Communication: No family present at admission.  Patient retains capacity.  Severity of Illness: The appropriate patient status for this patient is INPATIENT. Inpatient status is judged to be reasonable and necessary in order to provide the required intensity of service to ensure the patient's safety. The patient's presenting symptoms, physical exam findings, and initial radiographic and laboratory data in the context of their chronic comorbidities is felt to place them at high risk for further clinical deterioration. Furthermore, it is not anticipated that the patient will be medically stable for discharge from the hospital within 2 midnights of admission.   * I certify that at the point of admission it is my clinical judgment that the patient will require inpatient hospital care spanning beyond 2 midnights from the point of admission due to high intensity of service, high risk for further deterioration and high frequency of surveillance required.*  Author: Zebedee JAYSON Fujisawa, MD, FACP 04/27/2024 5:26 PM  For on call review [email or web address].

## 2024-04-27 NOTE — Progress Notes (Signed)
 ED Pharmacy Antibiotic Sign Off An antibiotic consult was received from an ED provider for zosyn   per pharmacy dosing for intra-abdominal infection. A chart review was completed to assess appropriateness.   The following one time order(s) were placed:  - zosyn  3.375 gm IV x1 over 30 min  Further antibiotic and/or antibiotic pharmacy consults should be ordered by the admitting provider if indicated.   Thank you for allowing pharmacy to be a part of this patient's care.   Osie Iantha SQUIBB, [facility]  Clinical Pharmacist 04/27/24 3:50 PM

## 2024-04-27 NOTE — Assessment & Plan Note (Signed)
 Likely cause of abdominal pain. She received 1 dose of Zosyn  in the emergency department will continue same Patient not a candidate and short-term for a colonoscopy given inflammation associated with diverticulitis.  Recommend she wait at least 6 weeks or until her GI doctor feels that it is safe to proceed.  She has an appointment on Friday with a GI doctor will likely not be able to make that appointment.  I recommended she call and postpone or have her PCP contact the GI office.

## 2024-04-27 NOTE — Assessment & Plan Note (Signed)
 Noted appears to be stable at present

## 2024-04-27 NOTE — Assessment & Plan Note (Signed)
 Diverticulitis causing left lower quadrant abdominal pain.  She is not tolerating it well at home despite oxycodone  20 mg p.o. every 4 hours.   - Will admit to hospital -Tylenol  for mild pain -Oxycodone  20 mg p.o. every 4 hours for moderate pain -Morphine  2 mg IV every 2 hours as needed severe pain -Gentle hydration -Regular diet as tolerated -GI was consulted in the ED by ED provider

## 2024-04-27 NOTE — Assessment & Plan Note (Signed)
 Blood pressure is elevated in the emergency department and at last visit. -Will start lisinopril  5 mg p.o. daily given patient has prediabetes -Monitor blood pressures -As needed hydralazine  for elevated pressures

## 2024-04-28 DIAGNOSIS — K5792 Diverticulitis of intestine, part unspecified, without perforation or abscess without bleeding: Secondary | ICD-10-CM

## 2024-04-28 DIAGNOSIS — E66812 Obesity, class 2: Secondary | ICD-10-CM | POA: Insufficient documentation

## 2024-04-28 LAB — COMPREHENSIVE METABOLIC PANEL WITH GFR
ALT: 52 U/L — ABNORMAL HIGH (ref 0–44)
AST: 121 U/L — ABNORMAL HIGH (ref 15–41)
Albumin: 3.3 g/dL — ABNORMAL LOW (ref 3.5–5.0)
Alkaline Phosphatase: 60 U/L (ref 38–126)
Anion gap: 9 (ref 5–15)
BUN: 11 mg/dL (ref 8–23)
CO2: 22 mmol/L (ref 22–32)
Calcium: 8.7 mg/dL — ABNORMAL LOW (ref 8.9–10.3)
Chloride: 105 mmol/L (ref 98–111)
Creatinine, Ser: 0.63 mg/dL (ref 0.44–1.00)
GFR, Estimated: >60 mL/min (ref >60)
Glucose, Bld: 76 mg/dL (ref 70–99)
Potassium: 3.7 mmol/L (ref 3.5–5.1)
Sodium: 136 mmol/L (ref 135–145)
Total Bilirubin: 0.7 mg/dL (ref 0.0–1.2)
Total Protein: 6.8 g/dL (ref 6.5–8.1)

## 2024-04-28 LAB — CBC
HCT: 40.9 % (ref 36.0–46.0)
Hemoglobin: 12.3 g/dL (ref 12.0–15.0)
MCH: 25.7 pg — ABNORMAL LOW (ref 26.0–34.0)
MCHC: 30.1 g/dL (ref 30.0–36.0)
MCV: 85.4 fL (ref 80.0–100.0)
Platelets: 251 K/uL (ref 150–400)
RBC: 4.79 MIL/uL (ref 3.87–5.11)
RDW: 15.6 % — ABNORMAL HIGH (ref 11.5–15.5)
WBC: 4.5 K/uL (ref 4.0–10.5)
nRBC: 0 % (ref 0.0–0.2)

## 2024-04-28 MED ORDER — MELATONIN 5 MG PO TABS
5.0000 mg | ORAL_TABLET | Freq: Once | ORAL | Status: AC
  Administered 2024-04-28: 5 mg via ORAL
  Filled 2024-04-28: qty 1

## 2024-04-28 MED ORDER — ENOXAPARIN SODIUM 60 MG/0.6ML IJ SOSY
50.0000 mg | PREFILLED_SYRINGE | INTRAMUSCULAR | Status: DC
  Administered 2024-04-28: 50 mg via SUBCUTANEOUS
  Filled 2024-04-28: qty 0.6

## 2024-04-28 MED ORDER — PHENOL 1.4 % MT LIQD
1.0000 | OROMUCOSAL | Status: DC | PRN
  Administered 2024-04-28: 1 via OROMUCOSAL
  Filled 2024-04-28: qty 177

## 2024-04-28 NOTE — Progress Notes (Signed)
  Progress Note   Patient: Erin Wolf FMW:[id number] DOB: 09-19-1956 DOA: 04/27/2024     1 DOS: the patient was seen and examined on 04/28/2024 at 9:15AM      Brief hospital course: 68 y.o. M with HTN, obesity, ulcerative colitis, HLD and who presented with abdominal pain, vomiting after recent hospitalization for diverticulitis last month.  She has been unable to keep much food down and has had significantly decreased oral intake and is not managing her symptoms at home very well. She has become recently very concerned about getting a colon opinion she is between GI doctors. Her brother died in the 81s of a colon cancer at 82 and she is very eager to get a colonoscopy expedited. She has an appointment on Friday with a GI doctor.  In the ER, CT showed findings c/w subacute diverticulitis.  Case discussed by phone with GI who recommended against colonoscopy in the acute setting.     Assessment and Plan: * Acute diverticulitis Still with a lot of pain on palpaition, unable to take PO CT shows no perforation or abscess. D/w GI, low likelihood that UC flare would appear like this on imaging, also no painless frequent bloody stools, this is doubted. - Soft diet - Analgesics - Continue Zosyn  - Serial abdominal exams   Ulcerative colitis (HCC) Discussed  with GI  Anxiety - Continue Prozac , Atarax   HTN (hypertension), benign BP soft - Hold lisinopril   HLD - Continue Zetia   Class II obesity Complicates care, BMI 37       Subjective: Stoill with pain.  Still vomiting with food.  No fever.  No confusion.  No bowel movements last 24 hours.     Physical Exam: BP 108/66 (BP Location: Right Arm)   Pulse 91   Temp 97.8 F (36.6 C) (Oral)   Resp 18   Ht 5' 4 (1.626 m)   Wt 99.8 kg   SpO2 100%   BMI 37.76 kg/m   Adult female, lying in bed, interactive and appropriate RRR, no murmurs, no peripheral edema Respiratory normal, lungs clear without rales or  wheezes Abdomen soft, severe tenderness in the left lower quadrant, guarding, no rigidity or rebound Attention normal, affect anxious, judgment and insight appear normal    Data Reviewed: Discussed with gastroenterology Basic metabolic panel normal CBC normal    Family Communication: Husband at the bedside    Disposition: Status is: Inpatient         Author: Lonni SHAUNNA Dalton, MD 04/28/2024 6:04 PM  For on call review [email or web address].

## 2024-04-28 NOTE — Progress Notes (Signed)
   04/28/24 1521  TOC Brief Assessment  Insurance and Status Reviewed  Patient has primary care physician Yes  Home environment has been reviewed resides in private residence with spouse  Prior level of function: Independent  Prior/Current Home Services No current home services  Social Drivers of Health Review SDOH reviewed no interventions necessary  Readmission risk has been reviewed Yes  Transition of care needs no transition of care needs at this time

## 2024-04-28 NOTE — Progress Notes (Signed)
 Pt states she had more of an appetite today compared to yesterday, she attempted to eat however vomited her food up. She attempted to eat again later and started feeling sick to her stomach and quickly stopped eating her food to avoid vomiting again, she states she believes she tolerates fluids better.

## 2024-04-28 NOTE — Hospital Course (Addendum)
 68 y.o. M with HTN, obesity, ulcerative colitis, HLD and who presented with abdominal pain, vomiting after recent hospitalization for diverticulitis last month.  She has been unable to keep much food down and has had significantly decreased oral intake and is not managing her symptoms at home very well. She has become recently very concerned about getting a colon opinion she is between GI doctors. Her brother died in the 54s of a colon cancer at 79 and she is very eager to get a colonoscopy expedited. She has an appointment on Friday with a GI doctor.  In the ER, CT showed findings c/w subacute diverticulitis.  Case discussed by phone with GI who recommended against colonoscopy in the acute setting.

## 2024-04-28 NOTE — Plan of Care (Signed)
   Problem: Coping: Goal: Level of anxiety will decrease Outcome: Progressing   Problem: Pain Managment: Goal: General experience of comfort will improve and/or be controlled Outcome: Progressing

## 2024-04-29 DIAGNOSIS — K5792 Diverticulitis of intestine, part unspecified, without perforation or abscess without bleeding: Secondary | ICD-10-CM | POA: Diagnosis not present

## 2024-04-29 LAB — COMPREHENSIVE METABOLIC PANEL WITH GFR
ALT: 94 U/L — ABNORMAL HIGH (ref 0–44)
AST: 96 U/L — ABNORMAL HIGH (ref 15–41)
Albumin: 3.3 g/dL — ABNORMAL LOW (ref 3.5–5.0)
Alkaline Phosphatase: 68 U/L (ref 38–126)
Anion gap: 8 (ref 5–15)
BUN: 11 mg/dL (ref 8–23)
CO2: 23 mmol/L (ref 22–32)
Calcium: 8.4 mg/dL — ABNORMAL LOW (ref 8.9–10.3)
Chloride: 102 mmol/L (ref 98–111)
Creatinine, Ser: 0.78 mg/dL (ref 0.44–1.00)
GFR, Estimated: >60 mL/min (ref >60)
Glucose, Bld: 93 mg/dL (ref 70–99)
Potassium: 4 mmol/L (ref 3.5–5.1)
Sodium: 133 mmol/L — ABNORMAL LOW (ref 135–145)
Total Bilirubin: 0.6 mg/dL (ref 0.0–1.2)
Total Protein: 6.7 g/dL (ref 6.5–8.1)

## 2024-04-29 LAB — CBC
HCT: 41.7 % (ref 36.0–46.0)
Hemoglobin: 13 g/dL (ref 12.0–15.0)
MCH: 26.7 pg (ref 26.0–34.0)
MCHC: 31.2 g/dL (ref 30.0–36.0)
MCV: 85.6 fL (ref 80.0–100.0)
Platelets: 288 K/uL (ref 150–400)
RBC: 4.87 MIL/uL (ref 3.87–5.11)
RDW: 15.5 % (ref 11.5–15.5)
WBC: 4.3 K/uL (ref 4.0–10.5)
nRBC: 0 % (ref 0.0–0.2)

## 2024-04-29 MED ORDER — FENTANYL CITRATE PF 50 MCG/ML IJ SOSY
50.0000 ug | PREFILLED_SYRINGE | Freq: Once | INTRAMUSCULAR | Status: AC
  Administered 2024-04-29: 50 ug via INTRAVENOUS
  Filled 2024-04-29: qty 1

## 2024-04-29 MED ORDER — AMOXICILLIN-POT CLAVULANATE 875-125 MG PO TABS
1.0000 | ORAL_TABLET | Freq: Two times a day (BID) | ORAL | 0 refills | Status: DC
Start: 2024-04-29 — End: 2024-05-23

## 2024-04-29 NOTE — Discharge Summary (Signed)
 Physician Discharge Summary   Patient: Erin Wolf MRN: [id number] DOB: 1955-12-05  Admit date:     04/27/2024  Discharge date: 04/29/24  Discharge Physician: Lonni SHAUNNA Dalton   PCP: Jerome Heron Ruth, PA-C     Recommendations at discharge:  Follow up with Gastroenterology Dr. Tamela for diverticulitis and ulcerative colitis Complete 7 more days Augmentin  Colonoscopy timing after diverticulitis per Dr. Tamela     Discharge Diagnoses: Principal Problem:   Acute diverticulitis Active Problems:   Ulcerative colitis (HCC)   HTN (hypertension), benign   Anxiety   Transaminitis   Class II obesity     Hospital Course: 68 y.o. M with HTN, obesity, ulcerative colitis, HLD and who presented with subjective fever, abdominal pain, vomiting after recent hospitalization for diverticulitis last month.  From admitting MD: [In the last few days] She has been unable to keep much food down and has had significantly decreased oral intake and is not managing her symptoms at home very well. She has become recently very concerned about getting a colon opinion she is between GI doctors. Her brother died in the 71s of a colon cancer at 10 and she is very eager to get a colonoscopy expedited. She has an appointment on Friday with a GI doctor.  In the ER, CT showed findings c/w subacute diverticulitis.  Case discussed by phone with GI who recommended against colonoscopy in the acute setting.      * Acute diverticulitis Admitted on IV fluids, Zosyn .  CT showed no perforation or abscess.  Abdominal pain improved over 48 hours, she was able to tolerate PO diet today, felt well for discharge.  She has close GI follow up.    Ulcerative colitis (HCC) She reported mucousy stools recently, but no hematochezia, no bloody stools observed in the hospital.  Given sparing of the rectum on imaging, do not suspect imaging mistook UC flare for diverticulitis.  Anxiety On Prozac ,  Atarax   Chronic pain syndrome Take daily opiates  Class 2 obesity  HTN (hypertension), benign On lisinopril             The [place]  Controlled Substances Registry was reviewed for this patient prior to discharge.  Consultants: Gastroenterology, Dr. Rosalie Procedures performed: CT abdomen  Disposition: Home Diet recommendation:  Discharge Diet Orders (From admission, onward)     Start     Ordered   04/29/24 0000  Diet - low sodium heart healthy        04/29/24 1110             DISCHARGE MEDICATION: Allergies as of 04/29/2024       Reactions   Celecoxib Nausea Only   Gabapentin Other (See Comments)   Abdominal pain   Latex Other (See Comments)   Peels skin Other Reaction(s): other latex   Nsaids Other (See Comments)   HAS COLITIS FLARES Non-steroidal anti-inflammatory agent (product)   Penicillins Hives, Other (See Comments)   Tolerates cefazolin , ceftriaxone , cephalexin , AND Augmentin    Wellbutrin [bupropion Hcl] Other (See Comments)   Insomnia and headaches   Aspirin  Nausea Only, Other (See Comments)   GI Intolerance   Darvocet [propoxyphene N-acetaminophen ] Nausea And Vomiting   Ketorolac  Tromethamine  Nausea And Vomiting   Other Reaction(s): Not available ketorolac  tromethamine    Tape Hives   Tylenol  [acetaminophen ] Nausea Only   Butalbital-apap-caffeine Other (See Comments)   Stomach upset   Ciprofloxacin  Other (See Comments)   Stomach upset Other Reaction(s): Not available ciprofloxacin    Eszopiclone Swelling   Other  Diarrhea, Other (See Comments)   Spicy foods cause diarrhea due to colitis   Topiramate    stomach upset Other Reaction(s): Not available topiramate   Wound Dressing Adhesive Hives        Medication List     PAUSE taking these medications    OZEMPIC (1 MG/DOSE) [place] Wait to take this until your doctor or other care provider tells you to start again. Inject 1 mg into the skin every 7 (seven) days. On Sundays        TAKE these medications    acetaminophen  500 MG tablet Commonly known as: TYLENOL  Take 1,000 mg by mouth every 6 (six) hours as needed for mild pain (pain score 1-3) or moderate pain (pain score 4-6).   amoxicillin -clavulanate 875-125 MG tablet Commonly known as: AUGMENTIN  Take 1 tablet by mouth 2 (two) times daily.   Centrum Women Tabs Take 1 tablet by mouth daily with breakfast.   ezetimibe  10 MG tablet Commonly known as: ZETIA  Take 10 mg by mouth in the morning.   FLUoxetine  20 MG capsule Commonly known as: PROZAC  Take 20 mg by mouth in the morning.   hydrOXYzine  10 MG tablet Commonly known as: ATARAX  Take 1 tablet (10 mg total) by mouth 3 (three) times daily as needed for anxiety or itching.   Linzess 72 MCG capsule Generic drug: linaclotide Take 72 mcg by mouth daily as needed (constipation).   Narcan  4 MG/0.1ML Liqd nasal spray kit Generic drug: naloxone  Place 1 spray into the nose once as needed (opioid overdose).   ondansetron  4 MG tablet Commonly known as: ZOFRAN  Take 1 tablet (4 mg total) by mouth every 6 (six) hours as needed for nausea.   Oxycodone  HCl 20 MG Tabs Take 20 mg by mouth 4 (four) times daily as needed (Pain).   SYSTANE OP Place 1 drop into both eyes 2 (two) times daily as needed (dry eyes).        Follow-up Information     Jerome Heron Ruth, PA-C. Schedule an appointment as soon as possible for a visit in 1 week(s).   Specialty: Physician Assistant Contact information: [address] [place], [place] [address] [phone]                 Discharge Instructions     Diet - low sodium heart healthy   Complete by: As directed    Discharge instructions   Complete by: As directed    **IMPORTANT DISCHARGE INSTRUCTIONS**   From Dr. Jonel: You were admitted for diverticulitis  You were treated here with antibiotics and your symptoms improved  You should continue antibiotics with Augmentin  875-125 mg twice daily  starting tonight (you have 6 more days after that)  Go see your primary care doctor within 1 week   Increase activity slowly   Complete by: As directed        Discharge Exam: Filed Weights   04/27/24 1121  Weight: 99.8 kg    General: Pt is alert, awake, not in acute distress Cardiovascular: RRR, nl S1-S2, no murmurs appreciated.   No LE edema.   Respiratory: Normal respiratory rate and rhythm.  CTAB without rales or wheezes. Abdominal: Abdomen soft and non-tender.  No distension or HSM.   Neuro/Psych: Strength symmetric in upper and lower extremities.  Judgment and insight appear normal.   Condition at discharge: fair  The results of significant diagnostics from this hospitalization (including imaging, microbiology, ancillary and laboratory) are listed below for reference.   Imaging Studies:  CT ABDOMEN PELVIS W CONTRAST Result Date: 04/27/2024 CLINICAL DATA:  Diverticulitis, complication suspected. EXAM: CT ABDOMEN AND PELVIS WITH CONTRAST TECHNIQUE: Multidetector CT imaging of the abdomen and pelvis was performed using the standard protocol following bolus administration of intravenous contrast. RADIATION DOSE REDUCTION: This exam was performed according to the departmental dose-optimization program which includes automated exposure control, adjustment of the mA and/or kV according to patient size and/or use of iterative reconstruction technique. CONTRAST:  80mL OMNIPAQUE  IOHEXOL  300 MG/ML  SOLN COMPARISON:  CT scan abdomen pelvis from 03/30/2024. FINDINGS: Lower chest: The lung bases are clear. No pleural effusion. The heart is normal in size. No pericardial effusion. Hepatobiliary: The liver is normal in size. Non-cirrhotic configuration. No suspicious mass. These is mild diffuse hepatic steatosis. No intrahepatic bile duct dilation. There is mild prominence of the extrahepatic bile duct, most likely due to post cholecystectomy status. Gallbladder is surgically absent. Pancreas:  Unremarkable. No focal mass. No surrounding inflammatory changes. Redemonstration of mild dilation of main pancreatic duct, essentially similar to the prior study. No obstructing mass. Spleen: Within normal limits. No focal lesion. Adrenals/Urinary Tract: Adrenal glands are unremarkable. No suspicious renal mass. No hydronephrosis. No renal or ureteric calculi. 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 Stomach/Bowel: No disproportionate dilation of the small or large bowel loops. No evidence of abnormal bowel wall thickening or inflammatory changes. The appendix is unremarkable. Redemonstration of mild fat stranding surrounding the colon at the junction of the descending colon and proximal sigmoid colon. Underlying colon exhibits multiple diverticula. No abnormal wall thickening however, evaluation is limited due to underdistention. Findings may represent subacute/smoldering diverticulitis in appropriate clinical settings. No associated pneumoperitoneum, pneumatosis or portal venous gas. No walled-off abscess or loculated collection. There are multiple additional diverticula throughout the colon without diverticulitis. Vascular/Lymphatic: No ascites or pneumoperitoneum. No abdominal or pelvic lymphadenopathy, by size criteria. No aneurysmal dilation of the major abdominal arteries. 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 Other: There is a tiny fat containing umbilical hernia. The soft tissues and abdominal wall are otherwise unremarkable. Musculoskeletal: No suspicious osseous lesions. There are mild - moderate multilevel degenerative changes in the visualized spine. IMPRESSION: 1. Redemonstration of mild fat stranding surrounding the colon at the junction of the descending colon and proximal sigmoid colon. Underlying colon exhibits multiple  diverticula. No abnormal wall thickening however, evaluation is limited due to underdistention. Findings may represent subacute/smoldering diverticulitis in appropriate clinical settings. No associated pneumoperitoneum, pneumatosis or portal venous gas. No walled-off abscess or loculated collection. 2. Multiple other nonacute observations, as described above. Electronically Signed   By: Ree Molt M.D.   On: 04/27/2024 15:15   CT ABDOMEN PELVIS W CONTRAST Result Date: 03/30/2024 CLINICAL DATA:  Nausea, vomiting, abdominal pain, bloody diarrhea. EXAM: CT ABDOMEN AND PELVIS WITH CONTRAST TECHNIQUE: Multidetector CT imaging of the abdomen and pelvis was performed using the standard protocol following bolus administration of intravenous contrast. RADIATION DOSE REDUCTION: This exam was performed according to the departmental dose-optimization program which includes automated exposure control, adjustment of the mA and/or kV according to patient size and/or use of iterative reconstruction technique. CONTRAST:  OMNIPAQUE  IOHEXOL  300 MG/ML  SOLN COMPARISON:  January 02, 2024. FINDINGS: Lower chest: No acute abnormality. Hepatobiliary: Stable intrahepatic and extrahepatic biliary dilatation is noted most likely due to post cholecystectomy status. Hepatic parenchyma is otherwise unremarkable. Pancreas: Stable pancreatic ductal dilatation is also  noted. No acute inflammation is noted. Spleen: Normal in size without focal abnormality. Adrenals/Urinary Tract: Stable left adrenal adenoma. Right adrenal gland is unremarkable. No hydronephrosis or renal obstruction is noted. Urinary bladder appears to be decompressed. Stomach/Bowel: Stomach is unremarkable. The appendix is unremarkable. There is no evidence of bowel obstruction. Possible mild diverticulitis of proximal sigmoid colon is noted. Vascular/Lymphatic: No significant vascular findings are present. No enlarged abdominal or pelvic lymph nodes. Reproductive:  Status post hysterectomy. No adnexal masses. Other: No ascites or hernia is noted. Musculoskeletal: Status post left total hip arthroplasty. No acute osseous abnormality is noted. IMPRESSION: Possible mild proximal sigmoid diverticulitis. No definite abscess formation is noted. Electronically Signed   By: Lynwood Landy Raddle M.D.   On: 03/30/2024 14:53    Microbiology: Results for orders placed or performed during the hospital encounter of 01/02/24  Urine Culture     Status: Abnormal   Collection Time: 01/02/24 11:15 PM   Specimen: Urine, Clean Catch  Result Value Ref Range Status   Specimen Description   Final    URINE, CLEAN CATCH Performed at [facility], [address] [place], [place] [address]    Special Requests   Final    NONE Performed at [facility], [address] [place], [place] [address]    Culture MULTIPLE SPECIES PRESENT, SUGGEST RECOLLECTION (A)  Final   Report Status 01/04/2024 FINAL  Final    Labs: CBC: Recent Labs  Lab 04/27/24 1209 04/27/24 1847 04/28/24 0500 04/29/24 0719  WBC 5.0 4.7 4.5 4.3  NEUTROABS 2.0  --   --   --   HGB 13.8 13.7 12.3 13.0  HCT 44.0 44.2 40.9 41.7  MCV 82.9 84.4 85.4 85.6  PLT 347 304 251 288   Basic Metabolic Panel: Recent Labs  Lab 04/27/24 1209 04/27/24 1847 04/28/24 0500 04/29/24 0719  NA 140  --  136 133*  K 3.7  --  3.7 4.0  CL 106  --  105 102  CO2 24  --  22 23  GLUCOSE 88  --  76 93  BUN 12  --  11 11  CREATININE 0.60 0.59 0.63 0.78  CALCIUM  9.5  --  8.7* 8.4*   Liver Function Tests: Recent Labs  Lab 04/27/24 1209 04/28/24 0500 04/29/24 0719  AST 17 121* 96*  ALT 20 52* 94*  ALKPHOS 68 60 68  BILITOT 0.2 0.7 0.6  PROT 8.2* 6.8 6.7  ALBUMIN 3.7 3.3* 3.3*   CBG: No results for input(s): GLUCAP in the last 168 hours.  Discharge time spent: approximately 45 minutes spent on discharge counseling, evaluation of patient on day of discharge, and coordination of discharge planning  with nursing, social work, pharmacy and case management  Signed: Lonni SHAUNNA Dalton, MD [facility] 04/29/2024

## 2024-04-29 NOTE — Progress Notes (Signed)
 Patient was given discharge instructions, and all questions were answered.  Patient was stable for discharge and was taken to the main exit by wheelchair.

## 2024-05-23 ENCOUNTER — Encounter (HOSPITAL_COMMUNITY): Payer: Self-pay | Admitting: *Deleted

## 2024-05-23 ENCOUNTER — Emergency Department (HOSPITAL_COMMUNITY)

## 2024-05-23 ENCOUNTER — Inpatient Hospital Stay (HOSPITAL_COMMUNITY)
Admission: EM | Admit: 2024-05-23 | Discharge: 2024-05-26 | DRG: 392 | Disposition: A | Discharge status: Home / Self Care | Attending: Internal Medicine | Admitting: Internal Medicine

## 2024-05-23 ENCOUNTER — Other Ambulatory Visit: Payer: Self-pay

## 2024-05-23 DIAGNOSIS — Z9049 Acquired absence of other specified parts of digestive tract: Secondary | ICD-10-CM

## 2024-05-23 DIAGNOSIS — K5732 Diverticulitis of large intestine without perforation or abscess without bleeding: Principal | ICD-10-CM | POA: Diagnosis present

## 2024-05-23 DIAGNOSIS — Z886 Allergy status to analgesic agent status: Secondary | ICD-10-CM

## 2024-05-23 DIAGNOSIS — Z6837 Body mass index (BMI) 37.0-37.9, adult: Secondary | ICD-10-CM

## 2024-05-23 DIAGNOSIS — Z888 Allergy status to other drugs, medicaments and biological substances status: Secondary | ICD-10-CM

## 2024-05-23 DIAGNOSIS — Z91040 Latex allergy status: Secondary | ICD-10-CM

## 2024-05-23 DIAGNOSIS — E66813 Obesity, class 3: Secondary | ICD-10-CM | POA: Diagnosis present

## 2024-05-23 DIAGNOSIS — K529 Noninfective gastroenteritis and colitis, unspecified: Secondary | ICD-10-CM | POA: Diagnosis not present

## 2024-05-23 DIAGNOSIS — F32A Depression, unspecified: Secondary | ICD-10-CM | POA: Diagnosis present

## 2024-05-23 DIAGNOSIS — E785 Hyperlipidemia, unspecified: Secondary | ICD-10-CM | POA: Diagnosis present

## 2024-05-23 DIAGNOSIS — F419 Anxiety disorder, unspecified: Secondary | ICD-10-CM | POA: Diagnosis present

## 2024-05-23 DIAGNOSIS — Z96653 Presence of artificial knee joint, bilateral: Secondary | ICD-10-CM | POA: Diagnosis present

## 2024-05-23 DIAGNOSIS — E876 Hypokalemia: Secondary | ICD-10-CM | POA: Diagnosis present

## 2024-05-23 DIAGNOSIS — M329 Systemic lupus erythematosus, unspecified: Secondary | ICD-10-CM | POA: Diagnosis present

## 2024-05-23 DIAGNOSIS — I10 Essential (primary) hypertension: Secondary | ICD-10-CM | POA: Diagnosis present

## 2024-05-23 DIAGNOSIS — Z820 Family history of epilepsy and other diseases of the nervous system: Secondary | ICD-10-CM

## 2024-05-23 DIAGNOSIS — Z800 Family history of malignant neoplasm of digestive organs: Secondary | ICD-10-CM

## 2024-05-23 DIAGNOSIS — Z90710 Acquired absence of both cervix and uterus: Secondary | ICD-10-CM

## 2024-05-23 DIAGNOSIS — R109 Unspecified abdominal pain: Secondary | ICD-10-CM

## 2024-05-23 DIAGNOSIS — Z880 Allergy status to penicillin: Secondary | ICD-10-CM

## 2024-05-23 DIAGNOSIS — Z7985 Long-term (current) use of injectable non-insulin antidiabetic drugs: Secondary | ICD-10-CM

## 2024-05-23 DIAGNOSIS — Z96642 Presence of left artificial hip joint: Secondary | ICD-10-CM | POA: Diagnosis present

## 2024-05-23 DIAGNOSIS — E871 Hypo-osmolality and hyponatremia: Secondary | ICD-10-CM | POA: Diagnosis present

## 2024-05-23 DIAGNOSIS — E119 Type 2 diabetes mellitus without complications: Secondary | ICD-10-CM | POA: Diagnosis present

## 2024-05-23 DIAGNOSIS — K5792 Diverticulitis of intestine, part unspecified, without perforation or abscess without bleeding: Secondary | ICD-10-CM | POA: Diagnosis present

## 2024-05-23 DIAGNOSIS — Z803 Family history of malignant neoplasm of breast: Secondary | ICD-10-CM

## 2024-05-23 DIAGNOSIS — Z8249 Family history of ischemic heart disease and other diseases of the circulatory system: Secondary | ICD-10-CM

## 2024-05-23 DIAGNOSIS — Z91048 Other nonmedicinal substance allergy status: Secondary | ICD-10-CM

## 2024-05-23 DIAGNOSIS — G40909 Epilepsy, unspecified, not intractable, without status epilepticus: Secondary | ICD-10-CM | POA: Diagnosis present

## 2024-05-23 DIAGNOSIS — Z794 Long term (current) use of insulin: Secondary | ICD-10-CM

## 2024-05-23 LAB — CBC WITH DIFFERENTIAL/PLATELET
Abs Immature Granulocytes: 0 K/uL (ref 0.00–0.07)
Basophils Absolute: 0 K/uL (ref 0.0–0.1)
Basophils Relative: 0 %
Eosinophils Absolute: 0 K/uL (ref 0.0–0.5)
Eosinophils Relative: 0 %
HCT: 48.2 % — ABNORMAL HIGH (ref 36.0–46.0)
Hemoglobin: 14.2 g/dL (ref 12.0–15.0)
Immature Granulocytes: 0 %
Lymphocytes Relative: 48 %
Lymphs Abs: 2.5 K/uL (ref 0.7–4.0)
MCH: 24.9 pg — ABNORMAL LOW (ref 26.0–34.0)
MCHC: 29.5 g/dL — ABNORMAL LOW (ref 30.0–36.0)
MCV: 84.4 fL (ref 80.0–100.0)
Monocytes Absolute: 0.5 K/uL (ref 0.1–1.0)
Monocytes Relative: 9 %
Neutro Abs: 2.3 K/uL (ref 1.7–7.7)
Neutrophils Relative %: 43 %
Platelets: 319 K/uL (ref 150–400)
RBC: 5.71 MIL/uL — ABNORMAL HIGH (ref 3.87–5.11)
RDW: 15.9 % — ABNORMAL HIGH (ref 11.5–15.5)
WBC: 5.2 K/uL (ref 4.0–10.5)
nRBC: 0 % (ref 0.0–0.2)

## 2024-05-23 LAB — COMPREHENSIVE METABOLIC PANEL WITH GFR
ALT: 12 U/L (ref 0–44)
AST: 12 U/L — ABNORMAL LOW (ref 15–41)
Albumin: 2.3 g/dL — ABNORMAL LOW (ref 3.5–5.0)
Alkaline Phosphatase: 39 U/L (ref 38–126)
Anion gap: 6 (ref 5–15)
BUN: 10 mg/dL (ref 8–23)
CO2: 18 mmol/L — ABNORMAL LOW (ref 22–32)
Calcium: 6.4 mg/dL — CL (ref 8.9–10.3)
Chloride: 117 mmol/L — ABNORMAL HIGH (ref 98–111)
Creatinine, Ser: 0.34 mg/dL — ABNORMAL LOW (ref 0.44–1.00)
GFR, Estimated: >60 mL/min (ref >60)
Glucose, Bld: 69 mg/dL — ABNORMAL LOW (ref 70–99)
Potassium: 2.9 mmol/L — ABNORMAL LOW (ref 3.5–5.1)
Sodium: 141 mmol/L (ref 135–145)
Total Bilirubin: 0.4 mg/dL (ref 0.0–1.2)
Total Protein: 4.9 g/dL — ABNORMAL LOW (ref 6.5–8.1)

## 2024-05-23 LAB — I-STAT CHEM 8, ED
BUN: 8 mg/dL (ref 8–23)
Calcium, Ion: 0.91 mmol/L — ABNORMAL LOW (ref 1.15–1.40)
Chloride: 116 mmol/L — ABNORMAL HIGH (ref 98–111)
Creatinine, Ser: 0.3 mg/dL — ABNORMAL LOW (ref 0.44–1.00)
Glucose, Bld: 62 mg/dL — ABNORMAL LOW (ref 70–99)
HCT: 30 % — ABNORMAL LOW (ref 36.0–46.0)
Hemoglobin: 10.2 g/dL — ABNORMAL LOW (ref 12.0–15.0)
Potassium: 2.8 mmol/L — ABNORMAL LOW (ref 3.5–5.1)
Sodium: 147 mmol/L — ABNORMAL HIGH (ref 135–145)
TCO2: 17 mmol/L — ABNORMAL LOW (ref 22–32)

## 2024-05-23 LAB — GLUCOSE, CAPILLARY
Glucose-Capillary: 64 mg/dL — ABNORMAL LOW (ref 70–99)
Glucose-Capillary: 65 mg/dL — ABNORMAL LOW (ref 70–99)
Glucose-Capillary: 69 mg/dL — ABNORMAL LOW (ref 70–99)
Glucose-Capillary: 100 mg/dL — ABNORMAL HIGH (ref 70–99)
Glucose-Capillary: 104 mg/dL — ABNORMAL HIGH (ref 70–99)

## 2024-05-23 LAB — URINALYSIS, ROUTINE W REFLEX MICROSCOPIC
Bilirubin Urine: NEGATIVE
Glucose, UA: NEGATIVE mg/dL
Hgb urine dipstick: NEGATIVE
Ketones, ur: NEGATIVE mg/dL
Leukocytes,Ua: NEGATIVE
Nitrite: NEGATIVE
Protein, ur: NEGATIVE mg/dL
Specific Gravity, Urine: 1.021 (ref 1.005–1.030)
pH: 5 (ref 5.0–8.0)

## 2024-05-23 LAB — LIPASE, BLOOD: Lipase: 27 U/L (ref 11–51)

## 2024-05-23 LAB — MAGNESIUM: Magnesium: 1.6 mg/dL — ABNORMAL LOW (ref 1.7–2.4)

## 2024-05-23 MED ORDER — POTASSIUM CHLORIDE 10 MEQ/100ML IV SOLN
10.0000 meq | Freq: Once | INTRAVENOUS | Status: AC
  Administered 2024-05-23: 10 meq via INTRAVENOUS
  Filled 2024-05-23: qty 100

## 2024-05-23 MED ORDER — TRAZODONE HCL 50 MG PO TABS
25.0000 mg | ORAL_TABLET | Freq: Every evening | ORAL | Status: DC | PRN
  Administered 2024-05-23 – 2024-05-25 (×3): 25 mg via ORAL
  Filled 2024-05-23 (×3): qty 1

## 2024-05-23 MED ORDER — HYDROMORPHONE HCL 1 MG/ML IJ SOLN
1.0000 mg | Freq: Once | INTRAMUSCULAR | Status: AC
  Administered 2024-05-23: 1 mg via INTRAVENOUS
  Filled 2024-05-23: qty 1

## 2024-05-23 MED ORDER — ONDANSETRON HCL 4 MG PO TABS
4.0000 mg | ORAL_TABLET | Freq: Four times a day (QID) | ORAL | Status: DC | PRN
  Administered 2024-05-25 – 2024-05-26 (×2): 4 mg via ORAL
  Filled 2024-05-23 (×2): qty 1

## 2024-05-23 MED ORDER — LINACLOTIDE 72 MCG PO CAPS: 72.0000 ug | ORAL_CAPSULE | Freq: Every day | ORAL | Status: DC | PRN

## 2024-05-23 MED ORDER — ROSUVASTATIN CALCIUM 20 MG PO TABS
10.0000 mg | ORAL_TABLET | Freq: Every day | ORAL | Status: DC
  Administered 2024-05-23 – 2024-05-25 (×3): 10 mg via ORAL
  Filled 2024-05-23 (×3): qty 1

## 2024-05-23 MED ORDER — OXYCODONE HCL 5 MG PO TABS
20.0000 mg | ORAL_TABLET | Freq: Four times a day (QID) | ORAL | Status: DC | PRN
  Administered 2024-05-23 – 2024-05-26 (×9): 20 mg via ORAL
  Filled 2024-05-23 (×10): qty 4

## 2024-05-23 MED ORDER — INSULIN ASPART 100 UNIT/ML IJ SOLN
0.0000 [IU] | Freq: Every day | INTRAMUSCULAR | Status: DC
  Filled 2024-05-23: qty 0.05

## 2024-05-23 MED ORDER — INSULIN ASPART 100 UNIT/ML IJ SOLN
0.0000 [IU] | Freq: Three times a day (TID) | INTRAMUSCULAR | Status: DC
  Filled 2024-05-23: qty 0.15

## 2024-05-23 MED ORDER — IOHEXOL 300 MG/ML  SOLN
100.0000 mL | Freq: Once | INTRAMUSCULAR | Status: AC | PRN
  Administered 2024-05-23: 100 mL via INTRAVENOUS

## 2024-05-23 MED ORDER — PIPERACILLIN-TAZOBACTAM 3.375 G IVPB
3.3750 g | Freq: Three times a day (TID) | INTRAVENOUS | Status: DC
  Administered 2024-05-24 – 2024-05-26 (×7): 3.375 g via INTRAVENOUS
  Filled 2024-05-23 (×7): qty 50

## 2024-05-23 MED ORDER — MORPHINE SULFATE (PF) 4 MG/ML IV SOLN
4.0000 mg | Freq: Once | INTRAVENOUS | Status: AC
  Administered 2024-05-23: 4 mg via INTRAVENOUS
  Filled 2024-05-23: qty 1

## 2024-05-23 MED ORDER — FLUOXETINE HCL 20 MG PO CAPS
20.0000 mg | ORAL_CAPSULE | Freq: Every day | ORAL | Status: DC
  Administered 2024-05-24 – 2024-05-26 (×3): 20 mg via ORAL
  Filled 2024-05-23 (×3): qty 1

## 2024-05-23 MED ORDER — POTASSIUM CHLORIDE CRYS ER 20 MEQ PO TBCR
40.0000 meq | EXTENDED_RELEASE_TABLET | Freq: Once | ORAL | Status: AC
  Administered 2024-05-23: 40 meq via ORAL
  Filled 2024-05-23: qty 2

## 2024-05-23 MED ORDER — CALCIUM GLUCONATE-NACL 1-0.675 GM/50ML-% IV SOLN
1.0000 g | Freq: Once | INTRAVENOUS | Status: AC
  Administered 2024-05-23: 1000 mg via INTRAVENOUS
  Filled 2024-05-23: qty 50

## 2024-05-23 MED ORDER — ONDANSETRON HCL 4 MG/2ML IJ SOLN
4.0000 mg | Freq: Once | INTRAMUSCULAR | Status: AC
  Administered 2024-05-23: 4 mg via INTRAVENOUS
  Filled 2024-05-23: qty 2

## 2024-05-23 MED ORDER — DEXTROSE-SODIUM CHLORIDE 5-0.9 % IV SOLN: INTRAVENOUS | Status: AC

## 2024-05-23 MED ORDER — ALBUTEROL SULFATE (2.5 MG/3ML) 0.083% IN NEBU: 2.5000 mg | INHALATION_SOLUTION | RESPIRATORY_TRACT | Status: DC | PRN

## 2024-05-23 MED ORDER — ENOXAPARIN SODIUM 40 MG/0.4ML IJ SOSY: 40.0000 mg | PREFILLED_SYRINGE | INTRAMUSCULAR | Status: DC

## 2024-05-23 MED ORDER — EZETIMIBE 10 MG PO TABS
10.0000 mg | ORAL_TABLET | Freq: Every day | ORAL | Status: DC
Start: 2024-05-24 — End: 2024-05-26
  Administered 2024-05-24 – 2024-05-26 (×3): 10 mg via ORAL
  Filled 2024-05-23 (×3): qty 1

## 2024-05-23 MED ORDER — HYDROMORPHONE HCL 1 MG/ML IJ SOLN
0.5000 mg | INTRAMUSCULAR | Status: DC | PRN
  Administered 2024-05-23 – 2024-05-25 (×7): 1 mg via INTRAVENOUS
  Filled 2024-05-23 (×7): qty 1

## 2024-05-23 MED ORDER — MELATONIN 5 MG PO TABS: 5.0000 mg | ORAL_TABLET | Freq: Every evening | ORAL | Status: DC | PRN

## 2024-05-23 MED ORDER — PIPERACILLIN-TAZOBACTAM 3.375 G IVPB 30 MIN
3.3750 g | INTRAVENOUS | Status: AC
  Administered 2024-05-23: 3.375 g via INTRAVENOUS
  Filled 2024-05-23: qty 50

## 2024-05-23 MED ORDER — ONDANSETRON HCL 4 MG/2ML IJ SOLN
4.0000 mg | Freq: Four times a day (QID) | INTRAMUSCULAR | Status: DC | PRN
  Administered 2024-05-24 – 2024-05-25 (×2): 4 mg via INTRAVENOUS
  Filled 2024-05-23 (×2): qty 2

## 2024-05-23 MED ORDER — DIAZEPAM 5 MG/ML IJ SOLN
2.5000 mg | Freq: Once | INTRAMUSCULAR | Status: AC
  Administered 2024-05-23: 2.5 mg via INTRAVENOUS
  Filled 2024-05-23: qty 2

## 2024-05-23 NOTE — ED Provider Notes (Signed)
 [facility] AT [facility] Provider Note   CSN: [id number] Arrival date & time: 05/23/24  9085     Patient presents with: Abdominal Pain   Erin Wolf is a 68 y.o. female.   68 year old female presenting with abdominal pain/nausea/vomiting.  Patient with history of ulcerative colitis, was hospitalized for UC flare/diverticulitis on 6/10 and 7/8.  Was seen approximately 1 month ago by Dr. Dorsey with gastroenterology, patient is scheduled for a colonoscopy in September.  Symptoms began on Friday, starts with nausea and then progresses to vomiting, abdominal pain, mucousy nonbloody diarrhea.  She is not currently on any steroids/other medications for management of her ulcerative colitis, she has been on Rowasa  enema in the past but this has been out of stock at her pharmacy and she has been unable to get it.  Denies fever, dysuria, lower extremity edema, chest pain, shortness of breath.   Abdominal Pain      Prior to Admission medications   Medication Sig Start Date End Date Taking? Authorizing Provider  acetaminophen  (TYLENOL ) 500 MG tablet Take 1,000 mg by mouth every 6 (six) hours as needed for mild pain (pain score 1-3) or moderate pain (pain score 4-6).    [provider]  amoxicillin -clavulanate (AUGMENTIN ) 875-125 MG tablet Take 1 tablet by mouth 2 (two) times daily. 04/29/24   Danford, Lonni SQUIBB, MD  ezetimibe  (ZETIA ) 10 MG tablet Take 10 mg by mouth in the morning. 04/07/24   [provider]  FLUoxetine  (PROZAC ) 20 MG capsule Take 20 mg by mouth in the morning.    [provider]  hydrOXYzine  (ATARAX ) 10 MG tablet Take 1 tablet (10 mg total) by mouth 3 (three) times daily as needed for anxiety or itching. 04/01/24   Cheryle Page, MD  linaclotide  (LINZESS ) 72 MCG capsule Take 72 mcg by mouth daily as needed (constipation).    [provider]  Multiple Vitamins-Minerals (CENTRUM WOMEN) TABS Take 1 tablet  by mouth daily with breakfast.    [provider]  naloxone  (NARCAN ) nasal spray 4 mg/0.1 mL Place 1 spray into the nose once as needed (opioid overdose).    [provider]  ondansetron  (ZOFRAN ) 4 MG tablet Take 1 tablet (4 mg total) by mouth every 6 (six) hours as needed for nausea. 04/01/24   Cheryle Page, MD  Oxycodone  HCl 20 MG TABS Take 20 mg by mouth 4 (four) times daily as needed (Pain).    [provider]  Polyethyl Glycol-Propyl Glycol (SYSTANE OP) Place 1 drop into both eyes 2 (two) times daily as needed (dry eyes).    [provider]  Semaglutide (OZEMPIC, 1 MG/DOSE, [place]) Inject 1 mg into the skin every 7 (seven) days. On Sundays Patient not taking: Reported on 04/27/2024    [provider]    Allergies: Celecoxib, Gabapentin, Latex, Nsaids, Penicillins, Wellbutrin [bupropion hcl], Aspirin , Darvocet [propoxyphene n-acetaminophen ], Ketorolac  tromethamine , Tape, Tylenol  [acetaminophen ], Butalbital-apap-caffeine, Ciprofloxacin , Eszopiclone, Other, Topiramate, and Wound dressing adhesive    Review of Systems  Gastrointestinal:  Positive for abdominal pain.    Updated Vital Signs  Vitals:   05/23/24 1400 05/23/24 1408 05/23/24 1506 05/23/24 1508  BP: 123/88  (!) 141/90 139/85  Pulse: 76  75 73  Resp:   18 18  Temp:  97.8 F (36.6 C) 97.7 F (36.5 C)   TempSrc:  Oral Oral   SpO2: 100%  100% 100%  Weight:        Physical Exam Vitals and nursing  note reviewed.  HENT:     Head: Normocephalic.  Eyes:     Extraocular Movements: Extraocular movements intact.  Cardiovascular:     Rate and Rhythm: Regular rhythm. Tachycardia present.     Heart sounds: Normal heart sounds.  Pulmonary:     Effort: Pulmonary effort is normal.     Breath sounds: Normal breath sounds.  Abdominal:     Palpations: Abdomen is soft.     Tenderness: There is abdominal tenderness (diffuse but worse in LLQ). There is guarding.  Musculoskeletal:     Cervical  back: Normal range of motion.     Right lower leg: No edema.     Left lower leg: No edema.     Comments: Moves all extremities spontaneously without difficulty  Skin:    General: Skin is warm and dry.  Neurological:     Mental Status: She is alert and oriented to person, place, and time.     (all labs ordered are listed, but only abnormal results are displayed) Labs Reviewed  URINALYSIS, ROUTINE W REFLEX MICROSCOPIC - Abnormal; Notable for the following components:      Result Value   APPearance HAZY (*)    All other components within normal limits  CBC WITH DIFFERENTIAL/PLATELET - Abnormal; Notable for the following components:   RBC 5.71 (*)    HCT 48.2 (*)    MCH 24.9 (*)    MCHC 29.5 (*)    RDW 15.9 (*)    All other components within normal limits  COMPREHENSIVE METABOLIC PANEL WITH GFR - Abnormal; Notable for the following components:   Potassium 2.9 (*)    Chloride 117 (*)    CO2 18 (*)    Glucose, Bld 69 (*)    Creatinine, Ser 0.34 (*)    Calcium  6.4 (*)    Total Protein 4.9 (*)    Albumin 2.3 (*)    AST 12 (*)    All other components within normal limits  I-STAT CHEM 8, ED - Abnormal; Notable for the following components:   Sodium 147 (*)    Potassium 2.8 (*)    Chloride 116 (*)    Creatinine, Ser 0.30 (*)    Glucose, Bld 62 (*)    Calcium , Ion 0.91 (*)    TCO2 17 (*)    Hemoglobin 10.2 (*)    HCT 30.0 (*)    All other components within normal limits  LIPASE, BLOOD  MAGNESIUM     EKG: None  Radiology: CT ABDOMEN PELVIS W CONTRAST Result Date: 05/23/2024 CLINICAL DATA:  Abdominal pain, nausea and vomiting. History of ulcerative colitis. EXAM: CT ABDOMEN AND PELVIS WITH CONTRAST TECHNIQUE: Multidetector CT imaging of the abdomen and pelvis was performed using the standard protocol following bolus administration of intravenous contrast. RADIATION DOSE REDUCTION: This exam was performed according to the departmental dose-optimization program which includes  automated exposure control, adjustment of the mA and/or kV according to patient size and/or use of iterative reconstruction technique. CONTRAST:  OMNIPAQUE  IOHEXOL  300 MG/ML  SOLN COMPARISON:  04/27/2024 and MRI 05/05/2020 FINDINGS: Lower chest: No acute abnormality at the lung bases. Hepatobiliary: Low-attenuation in the liver is suggestive for steatosis. Intrahepatic and extrahepatic biliary dilatation is again noted. Distal common bile duct measures 11 mm and previously measured 7 mm. Increased dilatation of the intrahepatic bile ducts. Again noted is a hypervascular area along the inferior right hepatic lobe. Pancreas: Again noted is diffuse dilatation of the main pancreatic duct measuring up to 8 mm  and previously measuring 7 mm. No peripancreatic inflammation. No clear evidence for a pancreatic mass or lesion. Spleen: Normal in size without focal abnormality. Adrenals/Urinary Tract: Again noted is fat attenuating lesion in the left adrenal gland measuring 1.2 cm and suggestive for a myelolipoma. No gross abnormality to the right adrenal gland. Normal appearance of both kidneys without hydronephrosis. No suspicious renal lesions. 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 Stomach/Bowel: Again noted are diverticula in the sigmoid colon and descending colon. No clear evidence for acute colonic inflammation. No evidence for bowel dilatation or obstruction. No acute abnormality to the stomach. Vascular/Lymphatic: Main visceral arteries are patent. Normal caliber of the abdominal aorta. No significant lymph node enlargement in the abdomen or pelvis. Reproductive: Limited evaluation due to the left hip arthroplasty artifact. Uterus appears to be surgically absent. No evidence for an adnexal mass. Other: Negative for free fluid.  Negative for free air. 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 Disc space narrowing and endplate changes at  L3 through S1. No acute bone abnormality. IMPRESSION: 1. Persistent but slightly increased biliary dilatation. The biliary dilatation has slightly increased since 04/27/2024. The pancreatic duct dilatation is also slightly increased. The biliary and pancreatic dilatation is a chronic finding and patient has had previous MRI and EUS. A discrete pancreatic or ampullary mass is not identified. In addition, there is some hypervascularity in the inferior right hepatic lobe which could be associated focal fat sparing but indeterminate. Patient may benefit from follow-up MRI with MRCP. 2. Colonic diverticulosis without clear evidence of acute diverticulitis. Difficult to exclude occult diverticulitis in the distal descending colon and proximal sigmoid colon area. 3. Hepatic steatosis. 4. Stable left adrenal myelolipoma. Electronically Signed   By: Juliene Balder M.D.   On: 05/23/2024 14:01     Procedures   Medications Ordered in the ED  morphine  (PF) 4 MG/ML injection 4 mg (4 mg Intravenous Given 05/23/24 1033)  ondansetron  (ZOFRAN ) injection 4 mg (4 mg Intravenous Given 05/23/24 1032)  diazepam  (VALIUM ) injection 2.5 mg (2.5 mg Intravenous Given 05/23/24 1034)  HYDROmorphone  (DILAUDID ) injection 1 mg (1 mg Intravenous Given 05/23/24 1133)  iohexol  (OMNIPAQUE ) 300 MG/ML solution 100 mL (100 mLs Intravenous Contrast Given 05/23/24 1315)  calcium  gluconate 1 g/ 50 mL sodium chloride  IVPB (0 mg Intravenous Stopped 05/23/24 1505)  potassium chloride  10 mEq in 100 mL IVPB (0 mEq Intravenous Stopped 05/23/24 1434)  HYDROmorphone  (DILAUDID ) injection 1 mg (1 mg Intravenous Given 05/23/24 1410)  potassium chloride  SA (KLOR-CON  M) CR tablet 40 mEq (40 mEq Oral Given 05/23/24 1436)                                    Medical Decision Making This patient presents to the ED for concern of abdominal pain/nausea/vomiting/mucousy diarrhea, this involves an extensive number of treatment options, and is a complaint that carries with it a  high risk of complications and morbidity.  The differential diagnosis includes ulcerative colitis flare, diverticulitis, diverticulitis with perforation, diverticulitis with abscess formation, electrolyte disturbances.   Co morbidities that complicate the patient evaluation  History of recurrent diverticulitis flares, ulcerative colitis, history of ileus   Additional history obtained:  Additional history obtained from record review External records from outside source obtained and reviewed including recent ED notes and hospital discharge summaries   Lab Tests:  I Ordered, and personally interpreted labs.  The pertinent results include: Urinalysis unremarkable.  CBC notable for elevated RBC/hematocrit/RDW with decreased MCH/MCHC, normal hemoglobin.  I-STAT Chem-8 notable for hypokalemia with potassium of 2.8, reduced creatinine at 0.3, anemia with hemoglobin of 10.2, borderline hypoglycemia.  CMP notable for hypocalcemia at 6.4, hypokalemia at 2.9, hypoalbuminemia at 2.3, decreased AST as compared to 3 weeks ago.   Imaging Studies ordered:  I ordered imaging studies including CT abdomen/pelvis with contrast I independently visualized and interpreted imaging which showed 1. Persistent but slightly increased biliary dilatation. The biliary dilatation has slightly increased since 04/27/2024. The pancreatic duct dilatation is also slightly increased. The biliary and pancreatic dilatation is a chronic finding and patient has had previous MRI and EUS. A discrete pancreatic or ampullary mass is not identified. In addition, there is some hypervascularity in the inferior right hepatic lobe which could be associated focal fat sparing but indeterminate. Patient may benefit from follow-up MRI with MRCP. 2. Colonic diverticulosis without clear evidence of acute diverticulitis. Difficult to exclude occult diverticulitis in the distal descending colon and proximal sigmoid colon area. 3. Hepatic steatosis. 4.  Stable left adrenal myelolipoma.  I agree with the radiologist interpretation   Cardiac Monitoring: / EKG:  The patient was maintained on a cardiac monitor.  I personally viewed and interpreted the cardiac monitored which showed an underlying rhythm of: Sinus tachycardia   Consultations Obtained:  I requested consultation with the hospitalist,  and discussed lab and imaging findings as well as pertinent plan - they recommend: Spoke with Dr. Roxane who agrees that this patient would likely benefit from admission given recurrent episodes of diverticulitis in the setting of ulcerative colitis   Problem List / ED Course / Critical interventions / Medication management  I ordered medication including morphine  for pain, Zofran  for nausea, Valium  for anxiety, Dilaudid  x 2 for pain, calcium /potassium for electrolyte replenishment Reevaluation of the patient after these medicines showed that the patient improved I have reviewed the patients home medicines and have made adjustments as needed   Test / Admission - Considered:  Physical exam notable as above.  Concern for recurrent flare of diverticulitis versus UC flare, will proceed with CT abdomen/pelvis, see above for results/interpretation.  No obvious diverticulitis on imaging, however this could not be ruled out.  CT also notable for biliary dilatation, however this has been noted previously and I have a low suspicion that this is contributing to the patient's symptoms today.  Patient's pain has been difficult to control in the emergency department today, she was also found to be hypocalcemic and hypokalemic requiring replenishment, patient could not tolerate IV potassium but was able to tolerate p.o. without additional vomiting.  Given patient's recurrent diverticulitis flares along with her ulcerative colitis, I do feel that she would benefit from admission for IV antibiotics and consideration of addition of IV steroids given the findings as  above.  I spoke with the hospitalist service who is in agreement with this plan, patient is appropriate for admission at this time.    Amount and/or Complexity of Data Reviewed Labs: ordered. Radiology: ordered.  Risk Prescription drug management. Decision regarding hospitalization.        Final diagnoses:  Hypocalcemia  Hypokalemia  Abdominal pain, unspecified abdominal location    ED Discharge Orders     None          Glendia Rocky SAILOR, [place] 05/23/24 1606    Simon Lavonia SAILOR, MD 05/24/24 [phone]

## 2024-05-23 NOTE — ED Triage Notes (Signed)
 Here by POV from home for LLQ abd pain, pt r/t her diverticulitis or colitis flare up. H/o same. Endorses pain, NVD, vx2, D x1 this am. Also mentions chronic low back pain radiating down R leg, h/o similar. Rates 10/10. Alert, NAD, calm, interactive.

## 2024-05-23 NOTE — Progress Notes (Incomplete)
 Hypoglycemic Event  CBG: 69  Treatment: 4 oz juice/soda  Symptoms: Hungry  Follow-up CBG: Time:*** CBG Result:***  Possible Reasons for Event: {Possible Reasons for Zczwu:[id number]}  Comments/MD notified:***    Erin Wolf

## 2024-05-23 NOTE — H&P (Signed)
 History and Physical  MARSA Wolf FMW:[id number] DOB: 1955/11/05 DOA: 05/23/2024  PCP: Jerome Heron Ruth, PA-C   Chief Complaint: Abdominal pain  HPI: Erin PASSON is a 68 y.o. female with medical history significant for depression, diabetes, ulcerative colitis on Rowesa years ago now followed by [name] being admitted to the hospital with recurrent abdominal pain and concern for possible subacute diverticulitis.  She was admitted to [facility] 6/10 to 6/12 as well as 7/8 to 7/10 for diverticulitis.  She was treated with empiric antibiotics during each time, in June when she was admitted, she was seen by GI and started on mesalamine  but for reasons unclear to me it seems that this had been discontinued by the time she was admitted in July.  Patient states that the last 2 times she was admitted, she was feeling better, tolerating a diet by the time she went home.  Patient states that since her last discharge from the hospital, over the last week or so she has had gradual development of mucousy stool and abdominal discomfort.  In the last 24 hours, the abdominal pain got very severe, she has been doubled over in pain, she has hardly been able to tolerate any p.o. intake without worsening abdominal pain and nausea.  She denies any fevers, denies significant amounts of blood in her stool.  Denies diarrhea, states that her bowel movements are essentially stable, other than becoming more mucousy.  Review of Systems: Please see HPI for pertinent positives and negatives. A complete 10 system review of systems are otherwise negative.  Past Medical History:  Diagnosis Date   Anxiety    Arthritis    Chronic abdominal pain    Chronic lower back pain    Chronic nausea    Depression    Diabetes mellitus without complication (HCC)    not on meds   Diverticulosis    Dyspnea    with exertion   Frequency of urination    GERD (gastroesophageal reflux disease)    Headache    Hematuria    Hepatitis     pt denies   Hepatitis C antibody positive in blood    per pt told by pcp   History of panic attacks    History of syncope    hx recurrent syncope --- per epic domentation non-cardiac , orthostatic hypotension, anxiety, dehydration   History of uterine fibroid    HTN (hypertension), benign    Hyperlipidemia 01/03/2012   IBS (irritable bowel syndrome)    Myalgia    Sciatic nerve disease, left 2023   Seizure disorder (HCC) followed by pcp until new neurologist appr. in jan 2019   started having them in my 20's ,  petit mal ----  per pt last seizure one 2015   SLE (systemic lupus erythematosus) [facility])    rheumatologist-- dr ziolkowska (consult 11-12-2016) having work-up done   Ulcerative colitis    followed by dr lennard at [facility]   Wears glasses    Past Surgical History:  Procedure Laterality Date   ABDOMINAL HYSTERECTOMY  1988   ARTERY BIOPSY Left 06/11/2022   Procedure: LEFT BIOPSY TEMPORAL ARTERY;  Surgeon: Stevie Herlene Righter, MD;  Location: [facility];  Service: General;  Laterality: Left;   CHOLECYSTECTOMY OPEN  1978   COLONOSCOPY N/A 05/31/2013   Procedure: COLONOSCOPY;  Surgeon: Lesta JULIANNA lennard, MD;  Location: [facility];  Service: Endoscopy;  Laterality: N/A;   CYSTOSCOPY/RETROGRADE/URETEROSCOPY Bilateral 07/23/2017   Procedure: CYSTOSCOPY/RETROGRADE/URETEROSCOPY;  Surgeon: Devere Lonni Righter, MD;  Location: [facility];  Service: Urology;  Laterality: Bilateral;   detached retina on right eye surgery      ESOPHAGOGASTRODUODENOSCOPY (EGD) WITH PROPOFOL  N/A 05/11/2014   Procedure: ESOPHAGOGASTRODUODENOSCOPY (EGD) WITH PROPOFOL ;  Surgeon: Jerrell KYM Sol, MD;  Location: [facility];  Service: Endoscopy;  Laterality: N/A;  egd first   ESOPHAGOGASTRODUODENOSCOPY (EGD) WITH PROPOFOL  N/A 05/08/2020   Procedure: ESOPHAGOGASTRODUODENOSCOPY (EGD) WITH PROPOFOL ;  Surgeon: Burnette Fallow, MD;  Location: [facility];  Service: Endoscopy;  Laterality: N/A;    EUS N/A 06/21/2016   Procedure: ESOPHAGEAL ENDOSCOPIC ULTRASOUND (EUS) RADIAL;  Surgeon: Fallow Burnette, MD;  Location: [facility];  Service: Endoscopy;  Laterality: N/A;   FLEXIBLE SIGMOIDOSCOPY N/A 05/11/2014   Procedure: FLEXIBLE SIGMOIDOSCOPY;  Surgeon: Jerrell KYM Sol, MD;  Location: [facility];  Service: Endoscopy;  Laterality: N/A;   LAPAROTOMY W/ BILATERAL SALPINGOOPHORECTOMY  09-26-2003   dr johnnye at [facility]   LAPROSCOPY W/ LYSIS ADHESIONS  06-08-2003   dr leva [facility]   TOTAL HIP ARTHROPLASTY Left 12/03/2022   Procedure: TOTAL HIP ARTHROPLASTY ANTERIOR APPROACH;  Surgeon: Ernie Cough, MD;  Location: [facility];  Service: Orthopedics;  Laterality: Left;   TOTAL KNEE ARTHROPLASTY Left 12/19/2020   Procedure: TOTAL KNEE ARTHROPLASTY;  Surgeon: Ernie Cough, MD;  Location: [facility];  Service: Orthopedics;  Laterality: Left;  70 mins   TOTAL KNEE ARTHROPLASTY Right 03/27/2021   Procedure: TOTAL KNEE ARTHROPLASTY;  Surgeon: Ernie Cough, MD;  Location: [facility];  Service: Orthopedics;  Laterality: Right;  70 mins   TRANSTHORACIC ECHOCARDIOGRAM  04/29/2016   ef 60-65%,  grade 2 diastolic dysfunction/  trivial MR and TR   TUBAL LIGATION Bilateral yrs ago   UPPER ESOPHAGEAL ENDOSCOPIC ULTRASOUND (EUS) N/A 05/08/2020   Procedure: UPPER ESOPHAGEAL ENDOSCOPIC ULTRASOUND (EUS);  Surgeon: Burnette Fallow, MD;  Location: [facility];  Service: Endoscopy;  Laterality: N/A;   Social History:  reports that she has never smoked. She has never used smokeless tobacco. She reports that she does not drink alcohol  and does not use drugs.  Allergies  Allergen Reactions   Celecoxib Nausea Only   Gabapentin Other (See Comments)    Abdominal pain   Latex Other (See Comments)    Peels skin  Other Reaction(s): other  latex   Nsaids Other (See Comments)    HAS COLITIS FLARES  Non-steroidal anti-inflammatory agent (product)   Penicillins Hives and Other (See Comments)    Tolerates cefazolin , ceftriaxone ,  cephalexin , AND Augmentin    Wellbutrin [Bupropion Hcl] Other (See Comments)    Insomnia and headaches   Aspirin  Nausea Only and Other (See Comments)    GI Intolerance   Darvocet [Propoxyphene N-Acetaminophen ] Nausea And Vomiting   Ketorolac  Tromethamine  Nausea And Vomiting    Other Reaction(s): Not available  ketorolac  tromethamine    Tape Hives   Tylenol  [Acetaminophen ] Nausea Only   Butalbital-Apap-Caffeine Other (See Comments)    Stomach upset   Ciprofloxacin  Other (See Comments)    Stomach upset  Other Reaction(s): Not available  ciprofloxacin    Eszopiclone Swelling   Other Diarrhea and Other (See Comments)    Spicy foods cause diarrhea due to colitis   Topiramate     stomach upset  Other Reaction(s): Not available  topiramate   Wound Dressing Adhesive Hives    Family History  Problem Relation Age of Onset   Alzheimer's disease Mother    Hypertension Mother    Other Father        ruptured appendix   Breast cancer Sister  Hypertension Sister    Hypertension Sister    Hypertension Sister    Heart disease Sister    Colon cancer Brother    Hypertension Brother    Cancer Brother    Memory loss Brother      Prior to Admission medications   Medication Sig Start Date End Date Taking? Authorizing Provider  acetaminophen  (TYLENOL ) 500 MG tablet Take 1,000 mg by mouth every 6 (six) hours as needed for mild pain (pain score 1-3) or moderate pain (pain score 4-6).    [provider]  amoxicillin -clavulanate (AUGMENTIN ) 875-125 MG tablet Take 1 tablet by mouth 2 (two) times daily. 04/29/24   Danford, Lonni SQUIBB, MD  ezetimibe  (ZETIA ) 10 MG tablet Take 10 mg by mouth in the morning. 04/07/24   [provider]  FLUoxetine  (PROZAC ) 20 MG capsule Take 20 mg by mouth in the morning.    [provider]  hydrOXYzine  (ATARAX ) 10 MG tablet Take 1 tablet (10 mg total) by mouth 3 (three) times daily as needed for anxiety or itching. 04/01/24   Cheryle Page, MD  linaclotide  (LINZESS ) 72 MCG capsule Take 72 mcg by mouth daily as needed (constipation).    [provider]  Multiple Vitamins-Minerals (CENTRUM WOMEN) TABS Take 1 tablet by mouth daily with breakfast.    [provider]  naloxone  (NARCAN ) nasal spray 4 mg/0.1 mL Place 1 spray into the nose once as needed (opioid overdose).    [provider]  ondansetron  (ZOFRAN ) 4 MG tablet Take 1 tablet (4 mg total) by mouth every 6 (six) hours as needed for nausea. 04/01/24   Cheryle Page, MD  Oxycodone  HCl 20 MG TABS Take 20 mg by mouth 4 (four) times daily as needed (Pain).    [provider]  Polyethyl Glycol-Propyl Glycol (SYSTANE OP) Place 1 drop into both eyes 2 (two) times daily as needed (dry eyes).    [provider]  Semaglutide (OZEMPIC, 1 MG/DOSE, ) Inject 1 mg into the skin every 7 (seven) days. On Sundays Patient not taking: Reported on 04/27/2024    [provider]    Physical Exam: BP 139/85 (BP Location: Right Arm)   Pulse 73   Temp 97.7 F (36.5 C) (Oral)   Resp 18   Wt 102.1 kg   SpO2 100%   BMI 38.62 kg/m  General:  Alert, oriented, anxious, in no acute distress, looks nontoxic Cardiovascular: RRR, no murmurs or rubs, no peripheral edema  Respiratory: clear to auscultation bilaterally, no wheezes, no crackles  Abdomen: soft, moderate left lower quadrant tenderness, nondistended, normal bowel tones heard  Skin: dry, no rashes  Musculoskeletal: no joint effusions, normal range of motion  Psychiatric: appropriate affect, normal speech  Neurologic: extraocular muscles intact, clear speech, moving all extremities with intact sensorium         Labs on Admission:  Basic Metabolic Panel: Recent Labs  Lab 05/23/24 1253 05/23/24 1258  NA 141 147*  K 2.9* 2.8*  CL 117* 116*  CO2 18*  --   GLUCOSE 69* 62*  BUN 10 8  CREATININE 0.34* 0.30*  CALCIUM  6.4*  --    Liver Function Tests: Recent Labs  Lab  05/23/24 1253  AST 12*  ALT 12  ALKPHOS 39  BILITOT 0.4  PROT 4.9*  ALBUMIN 2.3*   Recent Labs  Lab 05/23/24 1253  LIPASE 27   No results for input(s): AMMONIA in the last 168 hours. CBC: Recent Labs  Lab 05/23/24 1021 05/23/24  1258  WBC 5.2  --   NEUTROABS 2.3  --   HGB 14.2 10.2*  HCT 48.2* 30.0*  MCV 84.4  --   PLT 319  --    Cardiac Enzymes: No results for input(s): CKTOTAL, CKMB, CKMBINDEX, TROPONINI in the last 168 hours. BNP (last 3 results) No results for input(s): BNP in the last 8760 hours.  ProBNP (last 3 results) No results for input(s): PROBNP in the last 8760 hours.  CBG: No results for input(s): GLUCAP in the last 168 hours.  Radiological Exams on Admission: CT ABDOMEN PELVIS W CONTRAST Result Date: 05/23/2024 CLINICAL DATA:  Abdominal pain, nausea and vomiting. History of ulcerative colitis. EXAM: CT ABDOMEN AND PELVIS WITH CONTRAST TECHNIQUE: Multidetector CT imaging of the abdomen and pelvis was performed using the standard protocol following bolus administration of intravenous contrast. RADIATION DOSE REDUCTION: This exam was performed according to the departmental dose-optimization program which includes automated exposure control, adjustment of the mA and/or kV according to patient size and/or use of iterative reconstruction technique. CONTRAST:  OMNIPAQUE  IOHEXOL  300 MG/ML  SOLN COMPARISON:  04/27/2024 and MRI 05/05/2020 FINDINGS: Lower chest: No acute abnormality at the lung bases. Hepatobiliary: Low-attenuation in the liver is suggestive for steatosis. Intrahepatic and extrahepatic biliary dilatation is again noted. Distal common bile duct measures 11 mm and previously measured 7 mm. Increased dilatation of the intrahepatic bile ducts. Again noted is a hypervascular area along the inferior right hepatic lobe. Pancreas: Again noted is diffuse dilatation of the main pancreatic duct measuring up to 8 mm and previously measuring 7 mm.  No peripancreatic inflammation. No clear evidence for a pancreatic mass or lesion. Spleen: Normal in size without focal abnormality. Adrenals/Urinary Tract: Again noted is fat attenuating lesion in the left adrenal gland measuring 1.2 cm and suggestive for a myelolipoma. No gross abnormality to the right adrenal gland. Normal appearance of both kidneys without hydronephrosis. No suspicious renal lesions. 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 Stomach/Bowel: Again noted are diverticula in the sigmoid colon and descending colon. No clear evidence for acute colonic inflammation. No evidence for bowel dilatation or obstruction. No acute abnormality to the stomach. Vascular/Lymphatic: Main visceral arteries are patent. Normal caliber of the abdominal aorta. No significant lymph node enlargement in the abdomen or pelvis. Reproductive: Limited evaluation due to the left hip arthroplasty artifact. Uterus appears to be surgically absent. No evidence for an adnexal mass. Other: Negative for free fluid.  Negative for free air. 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 Disc space narrowing and endplate changes at L3 through S1. No acute bone abnormality. IMPRESSION: 1. Persistent but slightly increased biliary dilatation. The biliary dilatation has slightly increased since 04/27/2024. The pancreatic duct dilatation is also slightly increased. The biliary and pancreatic dilatation is a chronic finding and patient has had previous MRI and EUS. A discrete pancreatic or ampullary mass is not identified. In addition, there is some hypervascularity in the inferior right hepatic lobe which could be associated focal fat sparing but indeterminate. Patient may benefit from follow-up MRI with MRCP. 2. Colonic diverticulosis without clear evidence of acute diverticulitis. Difficult to exclude occult diverticulitis in the distal descending colon and proximal  sigmoid colon area. 3. Hepatic steatosis. 4. Stable left adrenal myelolipoma. Electronically Signed   By: Juliene Balder M.D.   On: 05/23/2024 14:01   Assessment/Plan ELPIDIA KARN is a 68 y.o. female with medical history significant for depression, diabetes, ulcerative  colitis on Rowesa years ago now followed by [name] being admitted to the hospital with recurrent abdominal pain and concern for possible subacute diverticulitis.   Subacute diverticulitis-concerning due to recurrent abdominal pain, and recent treatment for acute diverticulitis.  CT imaging today as noted above with inability to exclude occult diverticulitis.  Given her abdominal pain and mucousy bowel movements, I am also concerned that this may be related to her known inflammatory bowel disease. -Observation admission -Clear liquid diet -Discussed with [facility], who will consult -Empiric IV Zosyn  in the meantime  Ulcerative colitis-patient is currently followed by [facility], they have apparently placed the patient on Rowesa which she was on in the past however she has not been able to get this from the pharmacy yet  Acute anemia-baseline hemoglobin about 12, during most recent hospitalizations hemoglobin has ranged from 13-14.  She has had some intermittent bright red blood mixed with stool, though not in the last 24 hours.  Hemoglobin today 10.2. -Trend hemoglobin with daily labs -Avoid blood thinners for the time being  Insulin -dependent type 2 diabetes-carb modified diet once advanced, with moderate dose sliding scale  Hypokalemia-likely due to GI losses from vomiting and frequent loose bowel movements, though patient states this is not true diarrhea -Check magnesium  -Potassium repleted orally and IV in the ER -Recheck electrolytes with morning labs  Depression and anxiety-Prozac , Atarax  as needed  DVT prophylaxis: SCDs only   Full code  Consults called: [facility]  Admission status: Observation  Time spent: 53  minutes  [place] CHRISTELLA Gail MD [facility] Pager [phone]  If 7PM-7AM, please contact night-coverage [email or web address] Password [email or web address]  05/23/2024, 4:10 PM

## 2024-05-24 DIAGNOSIS — K529 Noninfective gastroenteritis and colitis, unspecified: Secondary | ICD-10-CM | POA: Diagnosis not present

## 2024-05-24 DIAGNOSIS — Z800 Family history of malignant neoplasm of digestive organs: Secondary | ICD-10-CM | POA: Diagnosis not present

## 2024-05-24 DIAGNOSIS — Z96642 Presence of left artificial hip joint: Secondary | ICD-10-CM | POA: Diagnosis present

## 2024-05-24 DIAGNOSIS — E876 Hypokalemia: Secondary | ICD-10-CM | POA: Diagnosis present

## 2024-05-24 DIAGNOSIS — Z803 Family history of malignant neoplasm of breast: Secondary | ICD-10-CM | POA: Diagnosis not present

## 2024-05-24 DIAGNOSIS — Z794 Long term (current) use of insulin: Secondary | ICD-10-CM | POA: Diagnosis not present

## 2024-05-24 DIAGNOSIS — Z96653 Presence of artificial knee joint, bilateral: Secondary | ICD-10-CM | POA: Diagnosis present

## 2024-05-24 DIAGNOSIS — Z8249 Family history of ischemic heart disease and other diseases of the circulatory system: Secondary | ICD-10-CM | POA: Diagnosis not present

## 2024-05-24 DIAGNOSIS — R109 Unspecified abdominal pain: Secondary | ICD-10-CM | POA: Diagnosis not present

## 2024-05-24 DIAGNOSIS — Z820 Family history of epilepsy and other diseases of the nervous system: Secondary | ICD-10-CM | POA: Diagnosis not present

## 2024-05-24 DIAGNOSIS — I10 Essential (primary) hypertension: Secondary | ICD-10-CM | POA: Diagnosis present

## 2024-05-24 DIAGNOSIS — F419 Anxiety disorder, unspecified: Secondary | ICD-10-CM | POA: Diagnosis present

## 2024-05-24 DIAGNOSIS — E119 Type 2 diabetes mellitus without complications: Secondary | ICD-10-CM | POA: Diagnosis present

## 2024-05-24 DIAGNOSIS — Z90710 Acquired absence of both cervix and uterus: Secondary | ICD-10-CM | POA: Diagnosis not present

## 2024-05-24 DIAGNOSIS — Z9049 Acquired absence of other specified parts of digestive tract: Secondary | ICD-10-CM | POA: Diagnosis not present

## 2024-05-24 DIAGNOSIS — M329 Systemic lupus erythematosus, unspecified: Secondary | ICD-10-CM | POA: Diagnosis present

## 2024-05-24 DIAGNOSIS — G40909 Epilepsy, unspecified, not intractable, without status epilepticus: Secondary | ICD-10-CM | POA: Diagnosis present

## 2024-05-24 DIAGNOSIS — E871 Hypo-osmolality and hyponatremia: Secondary | ICD-10-CM | POA: Diagnosis present

## 2024-05-24 DIAGNOSIS — K5732 Diverticulitis of large intestine without perforation or abscess without bleeding: Secondary | ICD-10-CM | POA: Diagnosis present

## 2024-05-24 DIAGNOSIS — E785 Hyperlipidemia, unspecified: Secondary | ICD-10-CM | POA: Diagnosis present

## 2024-05-24 DIAGNOSIS — F32A Depression, unspecified: Secondary | ICD-10-CM | POA: Diagnosis present

## 2024-05-24 DIAGNOSIS — E66813 Obesity, class 3: Secondary | ICD-10-CM | POA: Diagnosis present

## 2024-05-24 DIAGNOSIS — Z6837 Body mass index (BMI) 37.0-37.9, adult: Secondary | ICD-10-CM | POA: Diagnosis not present

## 2024-05-24 DIAGNOSIS — Z7985 Long-term (current) use of injectable non-insulin antidiabetic drugs: Secondary | ICD-10-CM | POA: Diagnosis not present

## 2024-05-24 LAB — BASIC METABOLIC PANEL WITH GFR
Anion gap: 11 (ref 5–15)
BUN: 10 mg/dL (ref 8–23)
CO2: 21 mmol/L — ABNORMAL LOW (ref 22–32)
Calcium: 8.6 mg/dL — ABNORMAL LOW (ref 8.9–10.3)
Chloride: 106 mmol/L (ref 98–111)
Creatinine, Ser: 0.55 mg/dL (ref 0.44–1.00)
GFR, Estimated: >60 mL/min (ref >60)
Glucose, Bld: 85 mg/dL (ref 70–99)
Potassium: 4 mmol/L (ref 3.5–5.1)
Sodium: 138 mmol/L (ref 135–145)

## 2024-05-24 LAB — GLUCOSE, CAPILLARY
Glucose-Capillary: 76 mg/dL (ref 70–99)
Glucose-Capillary: 83 mg/dL (ref 70–99)
Glucose-Capillary: 110 mg/dL — ABNORMAL HIGH (ref 70–99)
Glucose-Capillary: 74 mg/dL (ref 70–99)

## 2024-05-24 LAB — CBC
HCT: 42.4 % (ref 36.0–46.0)
Hemoglobin: 13 g/dL (ref 12.0–15.0)
MCH: 25.9 pg — ABNORMAL LOW (ref 26.0–34.0)
MCHC: 30.7 g/dL (ref 30.0–36.0)
MCV: 84.6 fL (ref 80.0–100.0)
Platelets: 251 K/uL (ref 150–400)
RBC: 5.01 MIL/uL (ref 3.87–5.11)
RDW: 15.8 % — ABNORMAL HIGH (ref 11.5–15.5)
WBC: 5.3 K/uL (ref 4.0–10.5)
nRBC: 0 % (ref 0.0–0.2)

## 2024-05-24 LAB — MAGNESIUM: Magnesium: 2.1 mg/dL (ref 1.7–2.4)

## 2024-05-24 MED ORDER — HYDROXYZINE HCL 10 MG PO TABS
10.0000 mg | ORAL_TABLET | Freq: Three times a day (TID) | ORAL | Status: DC | PRN
  Administered 2024-05-24 – 2024-05-26 (×4): 10 mg via ORAL
  Filled 2024-05-24 (×6): qty 1

## 2024-05-24 NOTE — Consult Note (Signed)
 [facility] Consultation Note  Referring Provider: [facility] Primary Care Physician:  Jerome Heron Ruth, PA-C Primary Gastroenterologist:  [name]  Reason for Consultation:  abd pain  HPI: Erin Wolf is a 68 y.o. female presenting recurrent LLQ pain.  Has had recurrent diverticulitis on imaging over the past seveal months.  History of inflammatory bowel disease with primary GI MD wishing to do colonoscopy, but these have had to be delayed due to her diverticulitis.  No blood in stool.  Some mucous.     Past Medical History:  Diagnosis Date   Anxiety    Arthritis    Chronic abdominal pain    Chronic lower back pain    Chronic nausea    Depression    Diabetes mellitus without complication (HCC)    not on meds   Diverticulosis    Dyspnea    with exertion   Frequency of urination    GERD (gastroesophageal reflux disease)    Headache    Hematuria    Hepatitis    pt denies   Hepatitis C antibody positive in blood    per pt told by pcp   History of panic attacks    History of syncope    hx recurrent syncope --- per epic domentation non-cardiac , orthostatic hypotension, anxiety, dehydration   History of uterine fibroid    HTN (hypertension), benign    Hyperlipidemia 01/03/2012   IBS (irritable bowel syndrome)    Myalgia    Sciatic nerve disease, left 2023   Seizure disorder (HCC) followed by pcp until new neurologist appr. in jan 2019   started having them in my 20's ,  petit mal ----  per pt last seizure one 2015   SLE (systemic lupus erythematosus) [facility])    rheumatologist-- dr ziolkowska (consult 11-12-2016) having work-up done   Ulcerative colitis    followed by dr lennard at [facility]   Wears glasses     Past Surgical History:  Procedure Laterality Date   ABDOMINAL HYSTERECTOMY  1988   ARTERY BIOPSY Left 06/11/2022   Procedure: LEFT BIOPSY TEMPORAL ARTERY;  Surgeon: Stevie Herlene Righter, MD;  Location: [facility];  Service: General;   Laterality: Left;   CHOLECYSTECTOMY OPEN  1978   COLONOSCOPY N/A 05/31/2013   Procedure: COLONOSCOPY;  Surgeon: Lesta JULIANNA lennard, MD;  Location: [facility] ENDOSCOPY;  Service: Endoscopy;  Laterality: N/A;   CYSTOSCOPY/RETROGRADE/URETEROSCOPY Bilateral 07/23/2017   Procedure: CYSTOSCOPY/RETROGRADE/URETEROSCOPY;  Surgeon: Devere Lonni Righter, MD;  Location: [facility];  Service: Urology;  Laterality: Bilateral;   detached retina on right eye surgery      ESOPHAGOGASTRODUODENOSCOPY (EGD) WITH PROPOFOL  N/A 05/11/2014   Procedure: ESOPHAGOGASTRODUODENOSCOPY (EGD) WITH PROPOFOL ;  Surgeon: Jerrell KYM Sol, MD;  Location: [facility];  Service: Endoscopy;  Laterality: N/A;  egd first   ESOPHAGOGASTRODUODENOSCOPY (EGD) WITH PROPOFOL  N/A 05/08/2020   Procedure: ESOPHAGOGASTRODUODENOSCOPY (EGD) WITH PROPOFOL ;  Surgeon: Burnette Fallow, MD;  Location: [facility];  Service: Endoscopy;  Laterality: N/A;   EUS N/A 06/21/2016   Procedure: ESOPHAGEAL ENDOSCOPIC ULTRASOUND (EUS) RADIAL;  Surgeon: Fallow Burnette, MD;  Location: [facility];  Service: Endoscopy;  Laterality: N/A;   FLEXIBLE SIGMOIDOSCOPY N/A 05/11/2014   Procedure: FLEXIBLE SIGMOIDOSCOPY;  Surgeon: Jerrell KYM Sol, MD;  Location: [facility];  Service: Endoscopy;  Laterality: N/A;   LAPAROTOMY W/ BILATERAL SALPINGOOPHORECTOMY  09-26-2003   dr johnnye at [facility]   LAPROSCOPY W/ LYSIS ADHESIONS  06-08-2003   dr leva [facility]   TOTAL HIP ARTHROPLASTY Left 12/03/2022  Procedure: TOTAL HIP ARTHROPLASTY ANTERIOR APPROACH;  Surgeon: Ernie Cough, MD;  Location: [facility];  Service: Orthopedics;  Laterality: Left;   TOTAL KNEE ARTHROPLASTY Left 12/19/2020   Procedure: TOTAL KNEE ARTHROPLASTY;  Surgeon: Ernie Cough, MD;  Location: [facility];  Service: Orthopedics;  Laterality: Left;  70 mins   TOTAL KNEE ARTHROPLASTY Right 03/27/2021   Procedure: TOTAL KNEE ARTHROPLASTY;  Surgeon: Ernie Cough, MD;  Location: [facility];  Service: Orthopedics;   Laterality: Right;  70 mins   TRANSTHORACIC ECHOCARDIOGRAM  04/29/2016   ef 60-65%,  grade 2 diastolic dysfunction/  trivial MR and TR   TUBAL LIGATION Bilateral yrs ago   UPPER ESOPHAGEAL ENDOSCOPIC ULTRASOUND (EUS) N/A 05/08/2020   Procedure: UPPER ESOPHAGEAL ENDOSCOPIC ULTRASOUND (EUS);  Surgeon: Burnette Fallow, MD;  Location: [facility];  Service: Endoscopy;  Laterality: N/A;    Prior to Admission medications   Medication Sig Start Date End Date Taking? Authorizing Provider  ezetimibe  (ZETIA ) 10 MG tablet Take 10 mg by mouth in the morning. 04/07/24  Yes [provider]  FLUoxetine  (PROZAC ) 20 MG capsule Take 20 mg by mouth in the morning.   Yes [provider]  linaclotide  (LINZESS ) 72 MCG capsule Take 72 mcg by mouth daily as needed (constipation).   Yes [provider]  Melatonin 10 MG TABS Take 1 tablet by mouth at bedtime as needed (sleep).   Yes [provider]  Multiple Vitamins-Minerals (CENTRUM WOMEN) TABS Take 1 tablet by mouth daily with breakfast.   Yes [provider]  Na Sulfate-K Sulfate-Mg Sulf (SUPREP BOWEL PREP KIT PO) Take by mouth. Mix and drink as directed.   Yes [provider]  naloxone  (NARCAN ) nasal spray 4 mg/0.1 mL Place 1 spray into the nose once as needed (opioid overdose).   Yes [provider]  ondansetron  (ZOFRAN ) 4 MG tablet Take 1 tablet (4 mg total) by mouth every 6 (six) hours as needed for nausea. 04/01/24  Yes Cheryle Page, MD  ondansetron  (ZOFRAN -ODT) 4 MG disintegrating tablet Take 4 mg by mouth every 8 (eight) hours as needed for nausea or vomiting.   Yes [provider]  Oxycodone  HCl 20 MG TABS Take 20 mg by mouth 4 (four) times daily as needed (Pain).   Yes [provider]  Polyethyl Glycol-Propyl Glycol (SYSTANE OP) Place 1 drop into both eyes 2 (two) times daily as needed (dry eyes).   Yes [provider]  rosuvastatin  (CRESTOR ) 10 MG tablet Take 10 mg by  mouth at bedtime.   Yes [provider]  Semaglutide (OZEMPIC, 1 MG/DOSE, [place]) Inject 1 mg into the skin every 7 (seven) days. On Sundays   Yes [provider]  acetaminophen  (TYLENOL ) 500 MG tablet Take 1,000 mg by mouth every 6 (six) hours as needed for mild pain (pain score 1-3) or moderate pain (pain score 4-6). Patient not taking: Reported on 05/23/2024    [provider]  hydrOXYzine  (ATARAX ) 10 MG tablet Take 1 tablet (10 mg total) by mouth 3 (three) times daily as needed for anxiety or itching. Patient not taking: Reported on 05/23/2024 04/01/24   Cheryle Page, MD    Current Facility-Administered Medications  Medication Dose Route Frequency Provider Last Rate Last Admin   albuterol  (PROVENTIL ) (2.5 MG/3ML) 0.083% nebulizer solution 2.5 mg  2.5 mg Nebulization Q2H PRN Zella, Mir M, MD       ezetimibe  (ZETIA ) tablet 10 mg  10 mg Oral Daily Ikramullah, Mir M, MD   10 mg  at 05/24/24 0845   FLUoxetine  (PROZAC ) capsule 20 mg  20 mg Oral Daily Zella, Mir M, MD   20 mg at 05/24/24 0845   HYDROmorphone  (DILAUDID ) injection 0.5-1 mg  0.5-1 mg Intravenous Q2H PRN Zella, Mir M, MD   1 mg at 05/24/24 1050   hydrOXYzine  (ATARAX ) tablet 10 mg  10 mg Oral TID PRN Pahwani, Ravi, MD   10 mg at 05/24/24 1149   insulin  aspart (novoLOG ) injection 0-15 Units  0-15 Units Subcutaneous TID WC Zella, Mir M, MD       insulin  aspart (novoLOG ) injection 0-5 Units  0-5 Units Subcutaneous QHS Zella, Mir M, MD       linaclotide  (LINZESS ) capsule 72 mcg  72 mcg Oral Daily PRN Zella, Mir M, MD       melatonin tablet 5 mg  5 mg Oral QHS PRN Zella, Mir M, MD       ondansetron  (ZOFRAN ) tablet 4 mg  4 mg Oral Q6H PRN Zella, Mir M, MD       Or   ondansetron  (ZOFRAN ) injection 4 mg  4 mg Intravenous Q6H PRN Zella, Mir M, MD       oxyCODONE  (Oxy IR/ROXICODONE ) immediate release tablet 20 mg  20 mg Oral QID PRN Zella, Mir M, MD   20 mg at 05/23/24 2341    piperacillin -tazobactam (ZOSYN ) IVPB 3.375 g  3.375 g Intravenous Q8H Pham, Anh P, RPH 12.5 mL/hr at 05/24/24 1225 3.375 g at 05/24/24 1225   rosuvastatin  (CRESTOR ) tablet 10 mg  10 mg Oral QHS Zella, Mir M, MD   10 mg at 05/23/24 2046   traZODone  (DESYREL ) tablet 25 mg  25 mg Oral QHS PRN Zella Katha HERO, MD   25 mg at 05/23/24 2213    Allergies as of 05/23/2024 - Review Complete 05/23/2024  Allergen Reaction Noted   Celecoxib Nausea Only 05/07/2011   Gabapentin Other (See Comments) 11/19/2023   Latex Other (See Comments) 06/20/2016   Nsaids Other (See Comments) 06/29/2016   Penicillins Hives and Other (See Comments) 05/07/2011   Wellbutrin [bupropion hcl] Other (See Comments) 05/07/2011   Aspirin  Nausea Only and Other (See Comments) 05/07/2011   Darvocet [propoxyphene n-acetaminophen ] Nausea And Vomiting 05/07/2011   Ketorolac  tromethamine  Nausea And Vomiting 03/19/2016   Tape Hives 11/09/2013   Tylenol  [acetaminophen ] Nausea Only 10/22/2014   Butalbital-apap-caffeine Other (See Comments) 01/24/2021   Ciprofloxacin  Other (See Comments) 01/24/2021   Eszopiclone Swelling 01/24/2021   Other Diarrhea and Other (See Comments) 08/01/2017   Topiramate  01/24/2021   Wound dressing adhesive Hives 11/09/2013    Family History  Problem Relation Age of Onset   Alzheimer's disease Mother    Hypertension Mother    Other Father        ruptured appendix   Breast cancer Sister    Hypertension Sister    Hypertension Sister    Hypertension Sister    Heart disease Sister    Colon cancer Brother    Hypertension Brother    Cancer Brother    Memory loss Brother     Social History   Socioeconomic History   Marital status: Married    Spouse name: Not on file   Number of children: 1   Years of education: 2 years college   Highest education level: Not on file  Occupational History   Occupation: Retired  Tobacco Use   Smoking status: Never   Smokeless tobacco: Never  Vaping Use    Vaping status: Never Used  Substance and Sexual Activity   Alcohol  use: Never    Comment: occ   Drug use: No   Sexual activity: Not Currently  Other Topics Concern   Not on file  Social History Narrative   Lives at home with husband.   Right-handed.   No caffeine use.   Social Drivers of Corporate investment banker Strain: Not on file  Food Insecurity: Patient Declined (04/27/2024)   Hunger Vital Sign    Worried About Running Out of Food in the Last Year: Patient declined    Ran Out of Food in the Last Year: Patient declined  Transportation Needs: Patient Declined (04/27/2024)   PRAPARE - Administrator, Civil Service (Medical): Patient declined    Lack of Transportation (Non-Medical): Patient declined  Physical Activity: Not on file  Stress: Not on file  Social Connections: Unknown (04/27/2024)   Social Connection and Isolation Panel    Frequency of Communication with Friends and Family: Patient declined    Frequency of Social Gatherings with Friends and Family: Patient declined    Attends Religious Services: Patient declined    Database administrator or Organizations: Patient declined    Attends Banker Meetings: Patient declined    Marital Status: Married  Catering manager Violence: Patient Declined (04/27/2024)   Humiliation, Afraid, Rape, and Kick questionnaire    Fear of Current or Ex-Partner: Patient declined    Emotionally Abused: Patient declined    Physically Abused: Patient declined    Sexually Abused: Patient declined    Review of Systems: As per HPI, all others negative  Physical Exam: Vital signs in last 24 hours: Temp:  [97.6 F (36.4 C)-98.6 F (37 C)] 97.6 F (36.4 C) (08/04 1340) Pulse Rate:  [72-95] 74 (08/04 1340) Resp:  [13-18] 18 (08/04 1340) BP: (112-179)/(64-89) 126/83 (08/04 1340) SpO2:  [98 %-100 %] 100 % (08/04 1340) Last BM Date : 05/23/24 General:   Alert,  Well-developed, well-nourished, pleasant and cooperative  in NAD Head:  Normocephalic and atraumatic. Eyes:  Sclera clear, no icterus.   Conjunctiva pink. Ears:  Normal auditory acuity. Nose:  No deformity, discharge,  or lesions. Mouth:  No deformity or lesions.  Oropharynx pink & moist. Neck:  Supple; no masses or thyromegaly. Lungs: No visible respiratory distress Abdomen:  Soft, focal LLQ tenderness with voluntary guarding, no peritonitis Msk:  Symmetrical without gross deformities. Normal posture. Pulses:  Normal pulses noted. Extremities:  Without clubbing or edema. Neurologic:  Alert and  oriented x4;  grossly normal neurologically. Skin:  Intact without significant lesions or rashes. Psych:  Alert and cooperative. Normal mood and affect.   Lab Results: Recent Labs    05/23/24 1021 05/23/24 1258 05/24/24 0436  WBC 5.2  --  5.3  HGB 14.2 10.2* 13.0  HCT 48.2* 30.0* 42.4  PLT 319  --  251   BMET Recent Labs    05/23/24 1253 05/23/24 1258 05/24/24 0621  NA 141 147* 138  K 2.9* 2.8* 4.0  CL 117* 116* 106  CO2 18*  --  21*  GLUCOSE 69* 62* 85  BUN 10 8 10   CREATININE 0.34* 0.30* 0.55  CALCIUM  6.4*  --  8.6*   LFT Recent Labs    05/23/24 1253  PROT 4.9*  ALBUMIN 2.3*  AST 12*  ALT 12  ALKPHOS 39  BILITOT 0.4   PT/INR No results for input(s): LABPROT, INR in the last 72 hours.  Studies/Results: CT ABDOMEN PELVIS W CONTRAST  Result Date: 05/23/2024 CLINICAL DATA:  Abdominal pain, nausea and vomiting. History of ulcerative colitis. EXAM: CT ABDOMEN AND PELVIS WITH CONTRAST TECHNIQUE: Multidetector CT imaging of the abdomen and pelvis was performed using the standard protocol following bolus administration of intravenous contrast. RADIATION DOSE REDUCTION: This exam was performed according to the departmental dose-optimization program which includes automated exposure control, adjustment of the mA and/or kV according to patient size and/or use of iterative reconstruction technique. CONTRAST:  OMNIPAQUE  IOHEXOL   300 MG/ML  SOLN COMPARISON:  04/27/2024 and MRI 05/05/2020 FINDINGS: Lower chest: No acute abnormality at the lung bases. Hepatobiliary: Low-attenuation in the liver is suggestive for steatosis. Intrahepatic and extrahepatic biliary dilatation is again noted. Distal common bile duct measures 11 mm and previously measured 7 mm. Increased dilatation of the intrahepatic bile ducts. Again noted is a hypervascular area along the inferior right hepatic lobe. Pancreas: Again noted is diffuse dilatation of the main pancreatic duct measuring up to 8 mm and previously measuring 7 mm. No peripancreatic inflammation. No clear evidence for a pancreatic mass or lesion. Spleen: Normal in size without focal abnormality. Adrenals/Urinary Tract: Again noted is fat attenuating lesion in the left adrenal gland measuring 1.2 cm and suggestive for a myelolipoma. No gross abnormality to the right adrenal gland. Normal appearance of both kidneys without hydronephrosis. No suspicious renal lesions. 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 Stomach/Bowel: Again noted are diverticula in the sigmoid colon and descending colon. No clear evidence for acute colonic inflammation. No evidence for bowel dilatation or obstruction. No acute abnormality to the stomach. Vascular/Lymphatic: Main visceral arteries are patent. Normal caliber of the abdominal aorta. No significant lymph node enlargement in the abdomen or pelvis. Reproductive: Limited evaluation due to the left hip arthroplasty artifact. Uterus appears to be surgically absent. No evidence for an adnexal mass. Other: Negative for free fluid.  Negative for free air. 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 Disc space narrowing and endplate changes at L3 through S1. No acute bone abnormality. IMPRESSION: 1. Persistent but slightly increased biliary dilatation. The biliary dilatation has slightly increased since  04/27/2024. The pancreatic duct dilatation is also slightly increased. The biliary and pancreatic dilatation is a chronic finding and patient has had previous MRI and EUS. A discrete pancreatic or ampullary mass is not identified. In addition, there is some hypervascularity in the inferior right hepatic lobe which could be associated focal fat sparing but indeterminate. Patient may benefit from follow-up MRI with MRCP. 2. Colonic diverticulosis without clear evidence of acute diverticulitis. Difficult to exclude occult diverticulitis in the distal descending colon and proximal sigmoid colon area. 3. Hepatic steatosis. 4. Stable left adrenal myelolipoma. Electronically Signed   By: Juliene Balder M.D.   On: 05/23/2024 14:01    Impression:   Abdominal pain.  Clinical exam and imaging most suggestive diverticulitis.  I do not think this is ulcerative colitis; there is no diarrhea or blood in stool and no other colitis seen.  Plan:   Antibiotics for diverticulitis. Gently and slowly advance diet. Sigmoidoscopy/colonoscopy is contraindicated. [facility] will follow.   LOS: 0 days   Lashala Laser M  05/24/2024, 3:24 PM  Cell [phone] If no answer or after 5 PM call [phone]

## 2024-05-24 NOTE — Progress Notes (Signed)
   05/24/24 1406  TOC Brief Assessment  Insurance and Status Reviewed  Patient has primary care physician Yes  Home environment has been reviewed resides in private residence with spouse  Prior level of function: Independent  Prior/Current Home Services No current home services  Social Drivers of Health Review SDOH reviewed no interventions necessary  Readmission risk has been reviewed Yes  Transition of care needs no transition of care needs at this time

## 2024-05-24 NOTE — Progress Notes (Signed)
 PROGRESS NOTE    Erin Wolf  FMW:[id number] DOB: Apr 22, 1956 DOA: 05/23/2024 PCP: Jerome Heron Ruth, PA-C   Brief Narrative:  HPI: Erin Wolf is a 68 y.o. female with medical history significant for depression, diabetes, ulcerative colitis on Rowesa years ago now followed by [name] being admitted to the hospital with recurrent abdominal pain and concern for possible subacute diverticulitis.  She was admitted to [facility] 6/10 to 6/12 as well as 7/8 to 7/10 for diverticulitis.  She was treated with empiric antibiotics during each time, in June when she was admitted, she was seen by GI and started on mesalamine  but for reasons unclear to me it seems that this had been discontinued by the time she was admitted in July.  Patient states that the last 2 times she was admitted, she was feeling better, tolerating a diet by the time she went home.  Patient states that since her last discharge from the hospital, over the last week or so she has had gradual development of mucousy stool and abdominal discomfort.  In the last 24 hours, the abdominal pain got very severe, she has been doubled over in pain, she has hardly been able to tolerate any p.o. intake without worsening abdominal pain and nausea.  She denies any fevers, denies significant amounts of blood in her stool.  Denies diarrhea, states that her bowel movements are essentially stable, other than becoming more mucousy.   Assessment & Plan:   Principal Problem:   Colitis  Subacute occult diverticulitis-concerning due to recurrent abdominal pain, and recent treatment for acute diverticulitis.  CT imaging inability to exclude occult diverticulitis.  Given her abdominal pain and mucousy bowel movements, I am also concerned that this may be related to her known inflammatory bowel disease.  Her pain has improved.  She is requesting to advance diet.  Tolerating clear liquid diet.  Will advance to full liquid diet.  Continue Zosyn .  [facility] is  consulted and she is pending evaluation.   Ulcerative colitis-patient is currently followed by [facility] Dr. Tamela, they have apparently placed the patient on Rowesa which she was on in the past however she has not been able to get this from the pharmacy yet.  Will defer to GI here to address that.   Acute anemia ruled out-baseline hemoglobin about 12, during most recent hospitalizations hemoglobin has ranged from 13-14.  Hemoglobin yesterday showed 10.2.  However today it shows 13.0 so I suspect error in yesterday's lab.   Insulin -dependent type 2 diabetes-hemoglobin A1c 6.4 about a month ago.  Patient only on diet control.  Continue SSI.    Hypokalemia-resolved.  Hyponatremia: Resolved.  Hypomagnesemia: Recheck labs today.   Depression and anxiety-Prozac , Atarax  as needed  Class III obesity: BMI 39.  Weight loss and diet modification counseled.  DVT prophylaxis: Place and maintain sequential compression device Start: 05/23/24 1621   Code Status: Full Code  Family Communication:  None present at bedside.  Plan of care discussed with patient in length and he/she verbalized understanding and agreed with it.  Status is: Observation The patient will require care spanning > 2 midnights and should be moved to inpatient because: Needs to be on antibiotics and slowly advance diet.   Estimated body mass index is 38.62 kg/m as calculated from the following:   Height as of 04/27/24: 5' 4 (1.626 m).   Weight as of this encounter: 102.1 kg.    Nutritional Assessment: Body mass index is 38.62 kg/m.SABRA Seen by dietician.  I agree with the assessment and plan as outlined below: Nutrition Status:        . Skin Assessment: I have examined the patient's skin and I agree with the wound assessment as performed by the wound care RN as outlined below:    Consultants:  GI  Procedures:  None  Antimicrobials:  Anti-infectives (From admission, onward)    Start     Dose/Rate Route  Frequency Ordered Stop   05/23/24 2200  piperacillin -tazobactam (ZOSYN ) IVPB 3.375 g        3.375 g 12.5 mL/hr over 240 Minutes Intravenous Every 8 hours 05/23/24 1618     05/23/24 1630  piperacillin -tazobactam (ZOSYN ) IVPB 3.375 g        3.375 g 100 mL/hr over 30 Minutes Intravenous NOW 05/23/24 1618 05/23/24 1938         Subjective: Patient seen and examined, she states that her abdominal pain is much better.  Her nausea is improving as well.  No other complaint.  Objective: Vitals:   05/23/24 1655 05/23/24 2137 05/24/24 0204 05/24/24 0632  BP: (!) 179/89 124/83 129/64 112/86  Pulse: 73 78 72 84  Resp: 17 16 13 18   Temp: 98.6 F (37 C) 98.3 F (36.8 C) 97.7 F (36.5 C) 97.7 F (36.5 C)  TempSrc: Oral Oral Oral Oral  SpO2: 100% 100% 99% 100%  Weight:        Intake/Output Summary (Last 24 hours) at 05/24/2024 0827 Last data filed at 05/24/2024 9378 Gross per 24 hour  Intake 1051.21 ml  Output --  Net 1051.21 ml   Filed Weights   05/23/24 0941  Weight: 102.1 kg    Examination:  General exam: Appears calm and comfortable obese Respiratory system: Clear to auscultation. Respiratory effort normal. Cardiovascular system: S1 & S2 heard, RRR. No JVD, murmurs, rubs, gallops or clicks. No pedal edema. Gastrointestinal system: Abdomen is nondistended, soft and left upper quadrant and left lower quadrant tenderness. No organomegaly or masses felt. Normal bowel sounds heard. Central nervous system: Alert and oriented. No focal neurological deficits. Extremities: Symmetric 5 x 5 power. Skin: No rashes, lesions or ulcers Psychiatry: Judgement and insight appear normal. Mood & affect appropriate.    Data Reviewed: I have personally reviewed following labs and imaging studies  CBC: Recent Labs  Lab 05/23/24 1021 05/23/24 1258 05/24/24 0436  WBC 5.2  --  5.3  NEUTROABS 2.3  --   --   HGB 14.2 10.2* 13.0  HCT 48.2* 30.0* 42.4  MCV 84.4  --  84.6  PLT 319  --  251    Basic Metabolic Panel: Recent Labs  Lab 05/23/24 1253 05/23/24 1258 05/23/24 1347 05/24/24 0621  NA 141 147*  --  138  K 2.9* 2.8*  --  4.0  CL 117* 116*  --  106  CO2 18*  --   --  21*  GLUCOSE 69* 62*  --  85  BUN 10 8  --  10  CREATININE 0.34* 0.30*  --  0.55  CALCIUM  6.4*  --   --  8.6*  MG  --   --  1.6*  --    GFR: Estimated Creatinine Clearance: 78.3 mL/min (by C-G formula based on SCr of 0.55 mg/dL). Liver Function Tests: Recent Labs  Lab 05/23/24 1253  AST 12*  ALT 12  ALKPHOS 39  BILITOT 0.4  PROT 4.9*  ALBUMIN 2.3*   Recent Labs  Lab 05/23/24 1253  LIPASE 27   No results for  input(s): AMMONIA in the last 168 hours. Coagulation Profile: No results for input(s): INR, PROTIME in the last 168 hours. Cardiac Enzymes: No results for input(s): CKTOTAL, CKMB, CKMBINDEX, TROPONINI in the last 168 hours. BNP (last 3 results) No results for input(s): PROBNP in the last 8760 hours. HbA1C: No results for input(s): HGBA1C in the last 72 hours. CBG: Recent Labs  Lab 05/23/24 1804 05/23/24 1819 05/23/24 1849 05/23/24 2134 05/24/24 0811  GLUCAP 65* 64* 100* 104* 76   Lipid Profile: No results for input(s): CHOL, HDL, LDLCALC, TRIG, CHOLHDL, LDLDIRECT in the last 72 hours. Thyroid  Function Tests: No results for input(s): TSH, T4TOTAL, FREET4, T3FREE, THYROIDAB in the last 72 hours. Anemia Panel: No results for input(s): VITAMINB12, FOLATE, FERRITIN, TIBC, IRON, RETICCTPCT in the last 72 hours. Sepsis Labs: No results for input(s): PROCALCITON, LATICACIDVEN in the last 168 hours.  No results found for this or any previous visit (from the past 240 hours).   Radiology Studies: CT ABDOMEN PELVIS W CONTRAST Result Date: 05/23/2024 CLINICAL DATA:  Abdominal pain, nausea and vomiting. History of ulcerative colitis. EXAM: CT ABDOMEN AND PELVIS WITH CONTRAST TECHNIQUE: Multidetector CT imaging of the abdomen  and pelvis was performed using the standard protocol following bolus administration of intravenous contrast. RADIATION DOSE REDUCTION: This exam was performed according to the departmental dose-optimization program which includes automated exposure control, adjustment of the mA and/or kV according to patient size and/or use of iterative reconstruction technique. CONTRAST:  OMNIPAQUE  IOHEXOL  300 MG/ML  SOLN COMPARISON:  04/27/2024 and MRI 05/05/2020 FINDINGS: Lower chest: No acute abnormality at the lung bases. Hepatobiliary: Low-attenuation in the liver is suggestive for steatosis. Intrahepatic and extrahepatic biliary dilatation is again noted. Distal common bile duct measures 11 mm and previously measured 7 mm. Increased dilatation of the intrahepatic bile ducts. Again noted is a hypervascular area along the inferior right hepatic lobe. Pancreas: Again noted is diffuse dilatation of the main pancreatic duct measuring up to 8 mm and previously measuring 7 mm. No peripancreatic inflammation. No clear evidence for a pancreatic mass or lesion. Spleen: Normal in size without focal abnormality. Adrenals/Urinary Tract: Again noted is fat attenuating lesion in the left adrenal gland measuring 1.2 cm and suggestive for a myelolipoma. No gross abnormality to the right adrenal gland. Normal appearance of both kidneys without hydronephrosis. No suspicious renal lesions. 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 Stomach/Bowel: Again noted are diverticula in the sigmoid colon and descending colon. No clear evidence for acute colonic inflammation. No evidence for bowel dilatation or obstruction. No acute abnormality to the stomach. Vascular/Lymphatic: Main visceral arteries are patent. Normal caliber of the abdominal aorta. No significant lymph node enlargement in the abdomen or pelvis. Reproductive: Limited evaluation due to the left hip arthroplasty  artifact. Uterus appears to be surgically absent. No evidence for an adnexal mass. Other: Negative for free fluid.  Negative for free air. 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 Disc space narrowing and endplate changes at L3 through S1. No acute bone abnormality. IMPRESSION: 1. Persistent but slightly increased biliary dilatation. The biliary dilatation has slightly increased since 04/27/2024. The pancreatic duct dilatation is also slightly increased. The biliary and pancreatic dilatation is a chronic finding and patient has had previous MRI and EUS. A discrete pancreatic or ampullary mass is not identified. In addition, there is some hypervascularity in the inferior right hepatic lobe which could be associated focal fat sparing but indeterminate. Patient may  benefit from follow-up MRI with MRCP. 2. Colonic diverticulosis without clear evidence of acute diverticulitis. Difficult to exclude occult diverticulitis in the distal descending colon and proximal sigmoid colon area. 3. Hepatic steatosis. 4. Stable left adrenal myelolipoma. Electronically Signed   By: Juliene Balder M.D.   On: 05/23/2024 14:01    Scheduled Meds:  ezetimibe   10 mg Oral Daily   FLUoxetine   20 mg Oral Daily   insulin  aspart  0-15 Units Subcutaneous TID WC   insulin  aspart  0-5 Units Subcutaneous QHS   rosuvastatin   10 mg Oral QHS   Continuous Infusions:  piperacillin -tazobactam (ZOSYN )  IV 3.375 g (05/24/24 0434)     LOS: 0 days   Fredia Skeeter, MD [facility]  05/24/2024, 8:27 AM   *Please note that this is a verbal dictation therefore any spelling or grammatical errors are due to the Dragon Medical One system interpretation.  Please page via Amion and do not message via secure chat for urgent patient care matters. Secure chat can be used for non urgent patient care matters.  How to contact the [facility] Attending or Consulting provider 7A - 7P or covering provider during after hours 7P -7A, for this patient?   Check the care team in [facility] and look for a) attending/consulting [facility] provider listed and b) the [facility] team listed. Page or secure chat 7A-7P. Log into [email or web address] and use [facility] universal password to access. If you do not have the password, please contact the hospital operator. Locate the [facility] provider you are looking for under [facility] and page to a number that you can be directly reached. If you still have difficulty reaching the provider, please page the [facility] (Director on Call) for the Hospitalists listed on amion for assistance.

## 2024-05-24 NOTE — Plan of Care (Signed)
   Problem: Coping: Goal: Ability to adjust to condition or change in health will improve Outcome: Progressing   Problem: Fluid Volume: Goal: Ability to maintain a balanced intake and output will improve Outcome: Progressing   Problem: Health Behavior/Discharge Planning: Goal: Ability to identify and utilize available resources and services will improve Outcome: Progressing

## 2024-05-24 NOTE — Plan of Care (Signed)
  Problem: Metabolic: Goal: Ability to maintain appropriate glucose levels will improve Outcome: Completed/Met   Problem: Nutritional: Goal: Maintenance of adequate nutrition will improve Outcome: Progressing   Problem: Activity: Goal: Risk for activity intolerance will decrease Outcome: Progressing

## 2024-05-25 DIAGNOSIS — K529 Noninfective gastroenteritis and colitis, unspecified: Secondary | ICD-10-CM | POA: Diagnosis not present

## 2024-05-25 LAB — COMPREHENSIVE METABOLIC PANEL WITH GFR
ALT: 73 U/L — ABNORMAL HIGH (ref 0–44)
AST: 72 U/L — ABNORMAL HIGH (ref 15–41)
Albumin: 3.1 g/dL — ABNORMAL LOW (ref 3.5–5.0)
Alkaline Phosphatase: 80 U/L (ref 38–126)
Anion gap: 9 (ref 5–15)
BUN: 8 mg/dL (ref 8–23)
CO2: 24 mmol/L (ref 22–32)
Calcium: 8.9 mg/dL (ref 8.9–10.3)
Chloride: 106 mmol/L (ref 98–111)
Creatinine, Ser: 0.69 mg/dL (ref 0.44–1.00)
GFR, Estimated: >60 mL/min (ref >60)
Glucose, Bld: 90 mg/dL (ref 70–99)
Potassium: 4.2 mmol/L (ref 3.5–5.1)
Sodium: 139 mmol/L (ref 135–145)
Total Bilirubin: 0.6 mg/dL (ref 0.0–1.2)
Total Protein: 6.7 g/dL (ref 6.5–8.1)

## 2024-05-25 LAB — CBC WITH DIFFERENTIAL/PLATELET
Abs Immature Granulocytes: 0.01 K/uL (ref 0.00–0.07)
Basophils Absolute: 0 K/uL (ref 0.0–0.1)
Basophils Relative: 0 %
Eosinophils Absolute: 0 K/uL (ref 0.0–0.5)
Eosinophils Relative: 1 %
HCT: 42.4 % (ref 36.0–46.0)
Hemoglobin: 12.3 g/dL (ref 12.0–15.0)
Immature Granulocytes: 0 %
Lymphocytes Relative: 48 %
Lymphs Abs: 2.3 K/uL (ref 0.7–4.0)
MCH: 25.2 pg — ABNORMAL LOW (ref 26.0–34.0)
MCHC: 29 g/dL — ABNORMAL LOW (ref 30.0–36.0)
MCV: 86.9 fL (ref 80.0–100.0)
Monocytes Absolute: 0.6 K/uL (ref 0.1–1.0)
Monocytes Relative: 12 %
Neutro Abs: 1.8 K/uL (ref 1.7–7.7)
Neutrophils Relative %: 39 %
Platelets: 247 K/uL (ref 150–400)
RBC: 4.88 MIL/uL (ref 3.87–5.11)
RDW: 15.6 % — ABNORMAL HIGH (ref 11.5–15.5)
WBC: 4.8 K/uL (ref 4.0–10.5)
nRBC: 0 % (ref 0.0–0.2)

## 2024-05-25 LAB — GLUCOSE, CAPILLARY
Glucose-Capillary: 89 mg/dL (ref 70–99)
Glucose-Capillary: 92 mg/dL (ref 70–99)
Glucose-Capillary: 103 mg/dL — ABNORMAL HIGH (ref 70–99)
Glucose-Capillary: 106 mg/dL — ABNORMAL HIGH (ref 70–99)

## 2024-05-25 NOTE — Progress Notes (Signed)
 Subjective: Ongoing LLQ pain, slowly improving  Objective: Vital signs in last 24 hours: Temp:  [97.3 F (36.3 C)-98.1 F (36.7 C)] 97.3 F (36.3 C) (08/05 1309) Pulse Rate:  [74-89] 84 (08/05 1309) Resp:  [16-18] 18 (08/05 1309) BP: (116-137)/(71-83) 127/71 (08/05 1309) SpO2:  [98 %-100 %] 100 % (08/05 1309) Weight change:  Last BM Date : 05/24/24  PE: GEN  NAD ABD:  Soft protuberant, mild LLQ tenderness NEURO:  No encephalopathy  Lab Results: CBC    Component Value Date/Time   WBC 4.8 05/25/2024 0447   RBC 4.88 05/25/2024 0447   HGB 12.3 05/25/2024 0447   HGB 12.3 04/24/2022 1336   HCT 42.4 05/25/2024 0447   PLT 247 05/25/2024 0447   MCV 86.9 05/25/2024 0447   MCH 25.2 (L) 05/25/2024 0447   MCHC 29.0 (L) 05/25/2024 0447   RDW 15.6 (H) 05/25/2024 0447   LYMPHSABS 2.3 05/25/2024 0447   MONOABS 0.6 05/25/2024 0447   EOSABS 0.0 05/25/2024 0447   BASOSABS 0.0 05/25/2024 0447  CMP     Component Value Date/Time   NA 139 05/25/2024 0447   K 4.2 05/25/2024 0447   CL 106 05/25/2024 0447   CO2 24 05/25/2024 0447   GLUCOSE 90 05/25/2024 0447   BUN 8 05/25/2024 0447   CREATININE 0.69 05/25/2024 0447   CALCIUM  8.9 05/25/2024 0447   PROT 6.7 05/25/2024 0447   ALBUMIN 3.1 (L) 05/25/2024 0447   AST 72 (H) 05/25/2024 0447   ALT 73 (H) 05/25/2024 0447   ALKPHOS 80 05/25/2024 0447   BILITOT 0.6 05/25/2024 0447   GFRNONAA >60 05/25/2024 0447   Assessment:  Abdominal pain. Clinical exam and imaging most suggestive diverticulitis. I do not think this is ulcerative colitis; there is no diarrhea or blood in stool and no other colitis seen.   Plan:   IV antibiotics another at least one day, maybe to oral tomorrow. Judicious analgesics. OOBTC, ambulate as tolerated. Soft diet for now; would not advance at this time. [facility] will follow.   Erin Wolf 05/25/2024, 1:34 PM   Cell [phone] If no answer or after 5 PM call [phone]

## 2024-05-25 NOTE — Progress Notes (Signed)
 Per pt she had vomit up her medication that was given which was the ATARAX  and oxy will administer Zofran , but Clinical research associate did not witness

## 2024-05-25 NOTE — Progress Notes (Signed)
 PROGRESS NOTE    Erin Wolf  FMW:[id number] DOB: [birth year] DOA: 05/23/2024 PCP: Jerome Heron Ruth, PA-C   Brief Narrative:  HPI: Erin Wolf is a 68 y.o. female with medical history significant for depression, diabetes, ulcerative colitis on Rowesa years ago now followed by [name] being admitted to the hospital with recurrent abdominal pain and concern for possible subacute diverticulitis.  She was admitted to [facility] 6/10 to 6/12 as well as 7/8 to 7/10 for diverticulitis.  She was treated with empiric antibiotics during each time, in June when she was admitted, she was seen by GI and started on mesalamine  but for reasons unclear to me it seems that this had been discontinued by the time she was admitted in July.  Patient states that the last 2 times she was admitted, she was feeling better, tolerating a diet by the time she went home.  Patient states that since her last discharge from the hospital, over the last week or so she has had gradual development of mucousy stool and abdominal discomfort.  In the last 24 hours, the abdominal pain got very severe, she has been doubled over in pain, she has hardly been able to tolerate any p.o. intake without worsening abdominal pain and nausea.  She denies any fevers, denies significant amounts of blood in her stool.  Denies diarrhea, states that her bowel movements are essentially stable, other than becoming more mucousy.   Assessment & Plan:   Principal Problem:   Colitis Active Problems:   Acute diverticulitis  Subacute occult diverticulitis-concerning due to recurrent abdominal pain, and recent treatment for acute diverticulitis.  CT imaging inability to exclude occult diverticulitis.  Seen by GI, they do not think she has any component of ulcerative colitis.  They recommended continuing antibiotics.  Patient is feeling slightly better but does not feel totally improved and ready to go home.  Will advance to soft diet and keep overnight.   Continue to biotics.    Ulcerative colitis-patient is currently followed by [facility] GI Dr. Tamela, they have apparently placed the patient on Rowesa which she was on in the past however she has not been able to get this from the pharmacy yet.  Will defer to GI here to address that.   Acute anemia ruled out-baseline hemoglobin about 12, during most recent hospitalizations hemoglobin has ranged from 13-14.  Hemoglobin yesterday showed 10.2.  However today it shows 13.0 so I suspect error in yesterday's lab.   Insulin -dependent type 2 diabetes-hemoglobin A1c 6.4 about a month ago.  Patient only on diet control.  Continue SSI.    Hypokalemia-resolved.  Hyponatremia: Resolved.  Hypomagnesemia: Resolved   Depression and anxiety-Prozac , Atarax  as needed  Class III obesity: BMI 39.  Weight loss and diet modification counseled.  DVT prophylaxis: Place and maintain sequential compression device Start: 05/23/24 1621   Code Status: Full Code  Family Communication:  None present at bedside.  Plan of care discussed with patient in length and he/she verbalized understanding and agreed with it.  Status is: Observation The patient will require care spanning > 2 midnights and should be moved to inpatient because: Needs to be on antibiotics and slowly advance diet.   Estimated body mass index is 38.62 kg/m as calculated from the following:   Height as of this encounter: 5' 4 (1.626 m).   Weight as of this encounter: 102.1 kg.    Nutritional Assessment: Body mass index is 38.62 kg/m.SABRA Seen by dietician.  I agree with the  assessment and plan as outlined below: Nutrition Status:        . Skin Assessment: I have examined the patient's skin and I agree with the wound assessment as performed by the wound care RN as outlined below:    Consultants:  GI  Procedures:  None  Antimicrobials:  Anti-infectives (From admission, onward)    Start     Dose/Rate Route Frequency Ordered Stop    05/23/24 2200  piperacillin -tazobactam (ZOSYN ) IVPB 3.375 g        3.375 g 12.5 mL/hr over 240 Minutes Intravenous Every 8 hours 05/23/24 1618     05/23/24 1630  piperacillin -tazobactam (ZOSYN ) IVPB 3.375 g        3.375 g 100 mL/hr over 30 Minutes Intravenous NOW 05/23/24 1618 05/24/24 1228         Subjective: Seen and examined, feels slightly better than yesterday but not completely improved and ready to go home yet.  No other complaint.  Objective: Vitals:   05/24/24 0959 05/24/24 1340 05/24/24 2126 05/25/24 0624  BP: 133/88 126/83 137/73 116/74  Pulse: 95 74 84 89  Resp: 18 18 16 16   Temp: 97.9 F (36.6 C) 97.6 F (36.4 C) 98.1 F (36.7 C) 98 F (36.7 C)  TempSrc: Oral Oral Oral Oral  SpO2: 98% 100% 100% 98%  Weight:      Height:        Intake/Output Summary (Last 24 hours) at 05/25/2024 0909 Last data filed at 05/25/2024 0814 Gross per 24 hour  Intake 1830.72 ml  Output 1350 ml  Net 480.72 ml   Filed Weights   05/23/24 0941  Weight: 102.1 kg    Examination:  General exam: Appears calm and comfortable  Respiratory system: Clear to auscultation. Respiratory effort normal. Cardiovascular system: S1 & S2 heard, RRR. No JVD, murmurs, rubs, gallops or clicks. No pedal edema. Gastrointestinal system: Abdomen is nondistended, soft and left lower quadrant tenderness. No organomegaly or masses felt. Normal bowel sounds heard. Central nervous system: Alert and oriented. No focal neurological deficits. Extremities: Symmetric 5 x 5 power. Skin: No rashes, lesions or ulcers.  Psychiatry: Judgement and insight appear normal. Mood & affect appropriate.   Data Reviewed: I have personally reviewed following labs and imaging studies  CBC: Recent Labs  Lab 05/23/24 1021 05/23/24 1258 05/24/24 0436 05/25/24 0447  WBC 5.2  --  5.3 4.8  NEUTROABS 2.3  --   --  1.8  HGB 14.2 10.2* 13.0 12.3  HCT 48.2* 30.0* 42.4 42.4  MCV 84.4  --  84.6 86.9  PLT 319  --  251 247   Basic  Metabolic Panel: Recent Labs  Lab 05/23/24 1253 05/23/24 1258 05/23/24 1347 05/24/24 0621 05/25/24 0447  NA 141 147*  --  138 139  K 2.9* 2.8*  --  4.0 4.2  CL 117* 116*  --  106 106  CO2 18*  --   --  21* 24  GLUCOSE 69* 62*  --  85 90  BUN 10 8  --  10 8  CREATININE 0.34* 0.30*  --  0.55 0.69  CALCIUM  6.4*  --   --  8.6* 8.9  MG  --   --  1.6* 2.1  --    GFR: Estimated Creatinine Clearance: 78.3 mL/min (by C-G formula based on SCr of 0.69 mg/dL). Liver Function Tests: Recent Labs  Lab 05/23/24 1253 05/25/24 0447  AST 12* 72*  ALT 12 73*  ALKPHOS 39 80  BILITOT 0.4 0.6  PROT 4.9* 6.7  ALBUMIN 2.3* 3.1*   Recent Labs  Lab 05/23/24 1253  LIPASE 27   No results for input(s): AMMONIA in the last 168 hours. Coagulation Profile: No results for input(s): INR, PROTIME in the last 168 hours. Cardiac Enzymes: No results for input(s): CKTOTAL, CKMB, CKMBINDEX, TROPONINI in the last 168 hours. BNP (last 3 results) No results for input(s): PROBNP in the last 8760 hours. HbA1C: No results for input(s): HGBA1C in the last 72 hours. CBG: Recent Labs  Lab 05/24/24 0811 05/24/24 1158 05/24/24 1644 05/24/24 2124 05/25/24 0800  GLUCAP 76 83 110* 74 89   Lipid Profile: No results for input(s): CHOL, HDL, LDLCALC, TRIG, CHOLHDL, LDLDIRECT in the last 72 hours. Thyroid  Function Tests: No results for input(s): TSH, T4TOTAL, FREET4, T3FREE, THYROIDAB in the last 72 hours. Anemia Panel: No results for input(s): VITAMINB12, FOLATE, FERRITIN, TIBC, IRON, RETICCTPCT in the last 72 hours. Sepsis Labs: No results for input(s): PROCALCITON, LATICACIDVEN in the last 168 hours.  No results found for this or any previous visit (from the past 240 hours).   Radiology Studies: CT ABDOMEN PELVIS W CONTRAST Result Date: 05/23/2024 CLINICAL DATA:  Abdominal pain, nausea and vomiting. History of ulcerative colitis. EXAM: CT ABDOMEN  AND PELVIS WITH CONTRAST TECHNIQUE: Multidetector CT imaging of the abdomen and pelvis was performed using the standard protocol following bolus administration of intravenous contrast. RADIATION DOSE REDUCTION: This exam was performed according to the departmental dose-optimization program which includes automated exposure control, adjustment of the mA and/or kV according to patient size and/or use of iterative reconstruction technique. CONTRAST:  OMNIPAQUE  IOHEXOL  300 MG/ML  SOLN COMPARISON:  04/27/2024 and MRI 05/05/2020 FINDINGS: Lower chest: No acute abnormality at the lung bases. Hepatobiliary: Low-attenuation in the liver is suggestive for steatosis. Intrahepatic and extrahepatic biliary dilatation is again noted. Distal common bile duct measures 11 mm and previously measured 7 mm. Increased dilatation of the intrahepatic bile ducts. Again noted is a hypervascular area along the inferior right hepatic lobe. Pancreas: Again noted is diffuse dilatation of the main pancreatic duct measuring up to 8 mm and previously measuring 7 mm. No peripancreatic inflammation. No clear evidence for a pancreatic mass or lesion. Spleen: Normal in size without focal abnormality. Adrenals/Urinary Tract: Again noted is fat attenuating lesion in the left adrenal gland measuring 1.2 cm and suggestive for a myelolipoma. No gross abnormality to the right adrenal gland. Normal appearance of both kidneys without hydronephrosis. No suspicious renal lesions. 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 Stomach/Bowel: Again noted are diverticula in the sigmoid colon and descending colon. No clear evidence for acute colonic inflammation. No evidence for bowel dilatation or obstruction. No acute abnormality to the stomach. Vascular/Lymphatic: Main visceral arteries are patent. Normal caliber of the abdominal aorta. No significant lymph node enlargement in the abdomen or  pelvis. Reproductive: Limited evaluation due to the left hip arthroplasty artifact. Uterus appears to be surgically absent. No evidence for an adnexal mass. Other: Negative for free fluid.  Negative for free air. 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 Disc space narrowing and endplate changes at L3 through S1. No acute bone abnormality. IMPRESSION: 1. Persistent but slightly increased biliary dilatation. The biliary dilatation has slightly increased since 04/27/2024. The pancreatic duct dilatation is also slightly increased. The biliary and pancreatic dilatation is a chronic finding and patient has had previous MRI and EUS. A discrete pancreatic or ampullary mass is not identified.  In addition, there is some hypervascularity in the inferior right hepatic lobe which could be associated focal fat sparing but indeterminate. Patient may benefit from follow-up MRI with MRCP. 2. Colonic diverticulosis without clear evidence of acute diverticulitis. Difficult to exclude occult diverticulitis in the distal descending colon and proximal sigmoid colon area. 3. Hepatic steatosis. 4. Stable left adrenal myelolipoma. Electronically Signed   By: Juliene Balder M.D.   On: 05/23/2024 14:01    Scheduled Meds:  ezetimibe   10 mg Oral Daily   FLUoxetine   20 mg Oral Daily   insulin  aspart  0-15 Units Subcutaneous TID WC   insulin  aspart  0-5 Units Subcutaneous QHS   rosuvastatin   10 mg Oral QHS   Continuous Infusions:  piperacillin -tazobactam (ZOSYN )  IV 3.375 g (05/25/24 0410)     LOS: 1 day   Fredia Skeeter, MD [facility]  05/25/2024, 9:09 AM   *Please note that this is a verbal dictation therefore any spelling or grammatical errors are due to the Dragon [employer] One system interpretation.  Please page via Amion and do not message via secure chat for urgent patient care matters. Secure chat can be used for non urgent patient care matters.  How to contact the [facility] Attending or Consulting provider 7A  - 7P or covering provider during after hours 7P -7A, for this patient?  Check the care team in [facility] and look for a) attending/consulting [facility] provider listed and b) the [facility] team listed. Page or secure chat 7A-7P. Log into [email or web address] and use Cloud's universal password to access. If you do not have the password, please contact the hospital operator. Locate the [facility] provider you are looking for under [facility] and page to a number that you can be directly reached. If you still have difficulty reaching the provider, please page the [facility] (Director on Call) for the Hospitalists listed on amion for assistance.

## 2024-05-26 ENCOUNTER — Other Ambulatory Visit (HOSPITAL_COMMUNITY): Payer: Self-pay

## 2024-05-26 DIAGNOSIS — R109 Unspecified abdominal pain: Secondary | ICD-10-CM | POA: Diagnosis not present

## 2024-05-26 DIAGNOSIS — E876 Hypokalemia: Secondary | ICD-10-CM | POA: Diagnosis not present

## 2024-05-26 DIAGNOSIS — K529 Noninfective gastroenteritis and colitis, unspecified: Secondary | ICD-10-CM | POA: Diagnosis not present

## 2024-05-26 LAB — GLUCOSE, CAPILLARY
Glucose-Capillary: 82 mg/dL (ref 70–99)
Glucose-Capillary: 101 mg/dL — ABNORMAL HIGH (ref 70–99)

## 2024-05-26 MED ORDER — AMOXICILLIN-POT CLAVULANATE 875-125 MG PO TABS
1.0000 | ORAL_TABLET | Freq: Two times a day (BID) | ORAL | 0 refills | Status: AC
  Filled 2024-05-26: qty 14, 7d supply, fill #0

## 2024-05-26 NOTE — Discharge Summary (Signed)
 Physician Discharge Summary   Patient: Erin Wolf MRN: [id number] DOB: 1955/12/16  Admit date:     05/23/2024  Discharge date: 05/26/24  Discharge Physician: Garnette Pelt   PCP: Erin Heron Ruth, PA-C   Recommendations at discharge:    Follow up with PCP in 1-2 weeks Follow up with Wolf as scheduled  Discharge Diagnoses: Principal Problem:   Colitis Active Problems:   Acute diverticulitis  Resolved Problems:   * No resolved hospital problems. *  Hospital Course: 68 y.o. female with medical history significant for depression, diabetes, ulcerative colitis on Rowesa years ago now followed by Erin Wolf being admitted to the hospital with recurrent abdominal pain and concern for possible subacute diverticulitis.  She was admitted to [facility] 6/10 to 6/12 as well as 7/8 to 7/10 for diverticulitis.  She was treated with empiric antibiotics during each time, in June when she was admitted, she was seen by Wolf and started on mesalamine  but for reasons unclear to me it seems that this had been discontinued by the time she was admitted in July.  Patient states that the last 2 times she was admitted, she was feeling better, tolerating a diet by the time she went home.  Patient states that since her last discharge from the hospital, over the last week or so she has had gradual development of mucousy stool and abdominal discomfort.  In the last 24 hours, the abdominal pain got very severe, she has been doubled over in pain, she has hardly been able to tolerate any p.o. intake without worsening abdominal pain and nausea.  She denies any fevers, denies significant amounts of blood in her stool.  Denies diarrhea, states that her bowel movements are essentially stable, other than becoming more mucousy.   Assessment and Plan: Subacute occult diverticulitis-concerning due to recurrent abdominal pain, and recent treatment for acute diverticulitis.  CT imaging inability to exclude occult diverticulitis.  Seen  by Wolf, they do not think she has any component of ulcerative colitis.  They recommended continuing antibiotics.   -Today feeling much better and eager to go home -Discussed with Wolf who recommends close outpatient follow up     Ulcerative colitis-patient is currently followed by [facility] Erin Wolf, they have apparently placed the patient on Rowesa which she was on in the past however she has not been able to get this from the pharmacy yet.  Discussed with Wolf who recommends close outpt f/u   Acute anemia ruled out- -remained hemodynamically stable   Insulin -dependent type 2 diabetes-hemoglobin A1c 6.4 about a month ago.  Patient only on diet control.  Continue SSI while inpt  -Cont home regimen on d/c   Hypokalemia-resolved.   Hyponatremia: Resolved.   Hypomagnesemia: Resolved   Depression and anxiety-Prozac , Atarax  as needed   Class III obesity: BMI 39.  Weight loss and diet modification counseled.        Consultants: Wolf Procedures performed:   Disposition: Home Diet recommendation:  Regular diet DISCHARGE MEDICATION: Allergies as of 05/26/2024       Reactions   Celecoxib Nausea Only   Gabapentin Other (See Comments)   Abdominal pain   Latex Other (See Comments)   Peels skin Other Reaction(s): other latex   Nsaids Other (See Comments)   HAS COLITIS FLARES Non-steroidal anti-inflammatory agent (product)   Penicillins Hives, Other (See Comments)   Tolerates cefazolin , ceftriaxone , cephalexin , AND Augmentin    Wellbutrin [bupropion Hcl] Other (See Comments)   Insomnia and headaches   Aspirin  Nausea  Only, Other (See Comments)   Wolf Intolerance   Darvocet [propoxyphene N-acetaminophen ] Nausea And Vomiting   Ketorolac  Tromethamine  Nausea And Vomiting   Other Reaction(s): Not available ketorolac  tromethamine    Tape Hives   Tylenol  [acetaminophen ] Nausea Only   Butalbital-apap-caffeine Other (See Comments)   Stomach upset   Ciprofloxacin  Other (See Comments)    Stomach upset Other Reaction(s): Not available ciprofloxacin    Eszopiclone Swelling   Other Diarrhea, Other (See Comments)   Spicy foods cause diarrhea due to colitis   Topiramate    stomach upset Other Reaction(s): Not available topiramate   Wound Dressing Adhesive Hives        Medication List     STOP taking these medications    acetaminophen  500 MG tablet Commonly known as: TYLENOL    OZEMPIC (1 MG/DOSE) [place]   SUPREP BOWEL PREP KIT PO       TAKE these medications    amoxicillin -clavulanate 875-125 MG tablet Commonly known as: AUGMENTIN  Take 1 tablet by mouth 2 (two) times daily for 7 days.   Centrum Women Tabs Take 1 tablet by mouth daily with breakfast.   ezetimibe  10 MG tablet Commonly known as: ZETIA  Take 10 mg by mouth in the morning.   FLUoxetine  20 MG capsule Commonly known as: PROZAC  Take 20 mg by mouth in the morning.   hydrOXYzine  10 MG tablet Commonly known as: ATARAX  Take 1 tablet (10 mg total) by mouth 3 (three) times daily as needed for anxiety or itching.   Linzess  72 MCG capsule Generic drug: linaclotide  Take 72 mcg by mouth daily as needed (constipation).   Melatonin 10 MG Tabs Take 1 tablet by mouth at bedtime as needed (sleep).   Narcan  4 MG/0.1ML Liqd nasal spray kit Generic drug: naloxone  Place 1 spray into the nose once as needed (opioid overdose).   ondansetron  4 MG disintegrating tablet Commonly known as: ZOFRAN -ODT Take 4 mg by mouth every 8 (eight) hours as needed for nausea or vomiting.   ondansetron  4 MG tablet Commonly known as: ZOFRAN  Take 1 tablet (4 mg total) by mouth every 6 (six) hours as needed for nausea.   Oxycodone  HCl 20 MG Tabs Take 20 mg by mouth 4 (four) times daily as needed (Pain).   rosuvastatin  10 MG tablet Commonly known as: CRESTOR  Take 10 mg by mouth at bedtime.   SYSTANE OP Place 1 drop into both eyes 2 (two) times daily as needed (dry eyes).        Follow-up Information     Erin Heron Ruth, PA-C Follow up in 2 week(s).   Specialty: Physician Assistant Why: Hospital follow up Contact information: [address] [place], [place] [address] [phone]                Discharge Exam: Fredricka Weights   05/23/24 0941  Weight: 102.1 kg   General exam: Awake, laying in bed, in nad Respiratory system: Normal respiratory effort, no wheezing Cardiovascular system: regular rate, s1, s2 Gastrointestinal system: Soft, nondistended, positive BS Central nervous system: CN2-12 grossly intact, strength intact Extremities: Perfused, no clubbing Skin: Normal skin turgor, no notable skin lesions seen Psychiatry: Mood normal // no visual hallucinations   Condition at discharge: fair  The results of significant diagnostics from this hospitalization (including imaging, microbiology, ancillary and laboratory) are listed below for reference.   Imaging Studies: CT ABDOMEN PELVIS W CONTRAST Result Date: 05/23/2024 CLINICAL DATA:  Abdominal pain, nausea and vomiting. History of ulcerative colitis. EXAM: CT ABDOMEN AND PELVIS  WITH CONTRAST TECHNIQUE: Multidetector CT imaging of the abdomen and pelvis was performed using the standard protocol following bolus administration of intravenous contrast. RADIATION DOSE REDUCTION: This exam was performed according to the departmental dose-optimization program which includes automated exposure control, adjustment of the mA and/or kV according to patient size and/or use of iterative reconstruction technique. CONTRAST:  OMNIPAQUE  IOHEXOL  300 MG/ML  SOLN COMPARISON:  04/27/2024 and MRI 05/05/2020 FINDINGS: Lower chest: No acute abnormality at the lung bases. Hepatobiliary: Low-attenuation in the liver is suggestive for steatosis. Intrahepatic and extrahepatic biliary dilatation is again noted. Distal common bile duct measures 11 mm and previously measured 7 mm. Increased dilatation of the intrahepatic bile ducts. Again noted is a hypervascular  area along the inferior right hepatic lobe. Pancreas: Again noted is diffuse dilatation of the main pancreatic duct measuring up to 8 mm and previously measuring 7 mm. No peripancreatic inflammation. No clear evidence for a pancreatic mass or lesion. Spleen: Normal in size without focal abnormality. Adrenals/Urinary Tract: Again noted is fat attenuating lesion in the left adrenal gland measuring 1.2 cm and suggestive for a myelolipoma. No gross abnormality to the right adrenal gland. Normal appearance of both kidneys without hydronephrosis. No suspicious renal lesions. 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 Stomach/Bowel: Again noted are diverticula in the sigmoid colon and descending colon. No clear evidence for acute colonic inflammation. No evidence for bowel dilatation or obstruction. No acute abnormality to the stomach. Vascular/Lymphatic: Main visceral arteries are patent. Normal caliber of the abdominal aorta. No significant lymph node enlargement in the abdomen or pelvis. Reproductive: Limited evaluation due to the left hip arthroplasty artifact. Uterus appears to be surgically absent. No evidence for an adnexal mass. Other: Negative for free fluid.  Negative for free air. 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 Disc space narrowing and endplate changes at L3 through S1. No acute bone abnormality. IMPRESSION: 1. Persistent but slightly increased biliary dilatation. The biliary dilatation has slightly increased since 04/27/2024. The pancreatic duct dilatation is also slightly increased. The biliary and pancreatic dilatation is a chronic finding and patient has had previous MRI and EUS. A discrete pancreatic or ampullary mass is not identified. In addition, there is some hypervascularity in the inferior right hepatic lobe which could be associated focal fat sparing but indeterminate. Patient may benefit from follow-up MRI with  MRCP. 2. Colonic diverticulosis without clear evidence of acute diverticulitis. Difficult to exclude occult diverticulitis in the distal descending colon and proximal sigmoid colon area. 3. Hepatic steatosis. 4. Stable left adrenal myelolipoma. Electronically Signed   By: Juliene Balder M.D.   On: 05/23/2024 14:01   CT ABDOMEN PELVIS W CONTRAST Result Date: 04/27/2024 CLINICAL DATA:  Diverticulitis, complication suspected. EXAM: CT ABDOMEN AND PELVIS WITH CONTRAST TECHNIQUE: Multidetector CT imaging of the abdomen and pelvis was performed using the standard protocol following bolus administration of intravenous contrast. RADIATION DOSE REDUCTION: This exam was performed according to the departmental dose-optimization program which includes automated exposure control, adjustment of the mA and/or kV according to patient size and/or use of iterative reconstruction technique. CONTRAST:  80mL OMNIPAQUE  IOHEXOL  300 MG/ML  SOLN COMPARISON:  CT scan abdomen pelvis from 03/30/2024. FINDINGS: Lower chest: The lung bases are clear. No pleural effusion. The heart is normal in size. No pericardial effusion. Hepatobiliary: The liver is normal in size. Non-cirrhotic configuration. No suspicious mass. These is mild diffuse hepatic steatosis. No intrahepatic bile duct dilation. There is  mild prominence of the extrahepatic bile duct, most likely due to post cholecystectomy status. Gallbladder is surgically absent. Pancreas: Unremarkable. No focal mass. No surrounding inflammatory changes. Redemonstration of mild dilation of main pancreatic duct, essentially similar to the prior study. No obstructing mass. Spleen: Within normal limits. No focal lesion. Adrenals/Urinary Tract: Adrenal glands are unremarkable. No suspicious renal mass. No hydronephrosis. No renal or ureteric calculi. 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 Stomach/Bowel: No  disproportionate dilation of the small or large bowel loops. No evidence of abnormal bowel wall thickening or inflammatory changes. The appendix is unremarkable. Redemonstration of mild fat stranding surrounding the colon at the junction of the descending colon and proximal sigmoid colon. Underlying colon exhibits multiple diverticula. No abnormal wall thickening however, evaluation is limited due to underdistention. Findings may represent subacute/smoldering diverticulitis in appropriate clinical settings. No associated pneumoperitoneum, pneumatosis or portal venous gas. No walled-off abscess or loculated collection. There are multiple additional diverticula throughout the colon without diverticulitis. Vascular/Lymphatic: No ascites or pneumoperitoneum. No abdominal or pelvic lymphadenopathy, by size criteria. No aneurysmal dilation of the major abdominal arteries. 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 Other: There is a tiny fat containing umbilical hernia. The soft tissues and abdominal wall are otherwise unremarkable. Musculoskeletal: No suspicious osseous lesions. There are mild - moderate multilevel degenerative changes in the visualized spine. IMPRESSION: 1. Redemonstration of mild fat stranding surrounding the colon at the junction of the descending colon and proximal sigmoid colon. Underlying colon exhibits multiple diverticula. No abnormal wall thickening however, evaluation is limited due to underdistention. Findings may represent subacute/smoldering diverticulitis in appropriate clinical settings. No associated pneumoperitoneum, pneumatosis or portal venous gas. No walled-off abscess or loculated collection. 2. Multiple other nonacute observations, as described above. Electronically Signed   By: Ree Molt M.D.   On: 04/27/2024 15:15    Microbiology: Results for orders placed or performed during the hospital encounter of  01/02/24  Urine Culture     Status: Abnormal   Collection Time: 01/02/24 11:15 PM   Specimen: Urine, Clean Catch  Result Value Ref Range Status   Specimen Description   Final    URINE, CLEAN CATCH Performed at [facility], [address] [place], [place] [address]    Special Requests   Final    NONE Performed at [facility], [address] [place], [place] [address]    Culture MULTIPLE SPECIES PRESENT, SUGGEST RECOLLECTION (A)  Final   Report Status 01/04/2024 FINAL  Final    Labs: CBC: Recent Labs  Lab 05/23/24 1021 05/23/24 1258 05/24/24 0436 05/25/24 0447  WBC 5.2  --  5.3 4.8  NEUTROABS 2.3  --   --  1.8  HGB 14.2 10.2* 13.0 12.3  HCT 48.2* 30.0* 42.4 42.4  MCV 84.4  --  84.6 86.9  PLT 319  --  251 247   Basic Metabolic Panel: Recent Labs  Lab 05/23/24 1253 05/23/24 1258 05/23/24 1347 05/24/24 0621 05/25/24 0447  NA 141 147*  --  138 139  K 2.9* 2.8*  --  4.0 4.2  CL 117* 116*  --  106 106  CO2 18*  --   --  21* 24  GLUCOSE 69* 62*  --  85 90  BUN 10 8  --  10 8  CREATININE 0.34* 0.30*  --  0.55 0.69  CALCIUM  6.4*  --   --  8.6* 8.9  MG  --   --  1.6* 2.1  --    Liver Function Tests: Recent Labs  Lab 05/23/24 1253 05/25/24 0447  AST 12* 72*  ALT 12 73*  ALKPHOS 39 80  BILITOT 0.4 0.6  PROT 4.9* 6.7  ALBUMIN 2.3* 3.1*   CBG: Recent Labs  Lab 05/25/24 1136 05/25/24 1637 05/25/24 2152 05/26/24 0740 05/26/24 1145  GLUCAP 92 103* 106* 82 101*    Discharge time spent: less than 30 minutes.  Signed: Garnette Pelt, MD [facility] 05/26/2024

## 2024-05-26 NOTE — Progress Notes (Signed)
 IV out, site bleeding this am. Site now clean and intact. Patient is apprehensive about IV insertion, provider contacted. Provider asked to hold IV reinsertion at this time.

## 2024-05-26 NOTE — Hospital Course (Signed)
 68 y.o. female with medical history significant for depression, diabetes, ulcerative colitis on Rowesa years ago now followed by [name] being admitted to the hospital with recurrent abdominal pain and concern for possible subacute diverticulitis.  She was admitted to [facility] 6/10 to 6/12 as well as 7/8 to 7/10 for diverticulitis.  She was treated with empiric antibiotics during each time, in June when she was admitted, she was seen by GI and started on mesalamine  but for reasons unclear to me it seems that this had been discontinued by the time she was admitted in July.  Patient states that the last 2 times she was admitted, she was feeling better, tolerating a diet by the time she went home.  Patient states that since her last discharge from the hospital, over the last week or so she has had gradual development of mucousy stool and abdominal discomfort.  In the last 24 hours, the abdominal pain got very severe, she has been doubled over in pain, she has hardly been able to tolerate any p.o. intake without worsening abdominal pain and nausea.  She denies any fevers, denies significant amounts of blood in her stool.  Denies diarrhea, states that her bowel movements are essentially stable, other than becoming more mucousy.

## 2024-06-25 DIAGNOSIS — E119 Type 2 diabetes mellitus without complications: Secondary | ICD-10-CM | POA: Insufficient documentation

## 2024-06-25 DIAGNOSIS — R109 Unspecified abdominal pain: Secondary | ICD-10-CM | POA: Diagnosis present

## 2024-06-25 DIAGNOSIS — R1033 Periumbilical pain: Secondary | ICD-10-CM | POA: Insufficient documentation

## 2024-06-25 DIAGNOSIS — R1032 Left lower quadrant pain: Secondary | ICD-10-CM | POA: Diagnosis not present

## 2024-06-25 DIAGNOSIS — I10 Essential (primary) hypertension: Secondary | ICD-10-CM | POA: Insufficient documentation

## 2024-06-25 DIAGNOSIS — Z91040 Latex allergy status: Secondary | ICD-10-CM | POA: Diagnosis not present

## 2024-06-26 ENCOUNTER — Emergency Department (HOSPITAL_COMMUNITY)
Admission: EM | Admit: 2024-06-26 | Discharge: 2024-06-26 | Disposition: A | Discharge status: Home / Self Care | Attending: Emergency Medicine | Admitting: Emergency Medicine

## 2024-06-26 ENCOUNTER — Encounter (HOSPITAL_COMMUNITY): Payer: Self-pay | Admitting: Emergency Medicine

## 2024-06-26 ENCOUNTER — Emergency Department (HOSPITAL_COMMUNITY)

## 2024-06-26 ENCOUNTER — Other Ambulatory Visit: Payer: Self-pay

## 2024-06-26 DIAGNOSIS — R1032 Left lower quadrant pain: Secondary | ICD-10-CM | POA: Diagnosis not present

## 2024-06-26 LAB — URINALYSIS, ROUTINE W REFLEX MICROSCOPIC
Bilirubin Urine: NEGATIVE
Glucose, UA: NEGATIVE mg/dL
Hgb urine dipstick: NEGATIVE
Ketones, ur: NEGATIVE mg/dL
Nitrite: NEGATIVE
Protein, ur: NEGATIVE mg/dL
Specific Gravity, Urine: 1.025 (ref 1.005–1.030)
pH: 5 (ref 5.0–8.0)

## 2024-06-26 LAB — CBC
HCT: 44.4 % (ref 36.0–46.0)
Hemoglobin: 13.2 g/dL (ref 12.0–15.0)
MCH: 25.5 pg — ABNORMAL LOW (ref 26.0–34.0)
MCHC: 29.7 g/dL — ABNORMAL LOW (ref 30.0–36.0)
MCV: 85.9 fL (ref 80.0–100.0)
Platelets: 305 K/uL (ref 150–400)
RBC: 5.17 MIL/uL — ABNORMAL HIGH (ref 3.87–5.11)
RDW: 15.4 % (ref 11.5–15.5)
WBC: 7 K/uL (ref 4.0–10.5)
nRBC: 0 % (ref 0.0–0.2)

## 2024-06-26 LAB — COMPREHENSIVE METABOLIC PANEL WITH GFR
ALT: 10 U/L (ref 0–44)
AST: 20 U/L (ref 15–41)
Albumin: 3.8 g/dL (ref 3.5–5.0)
Alkaline Phosphatase: 73 U/L (ref 38–126)
Anion gap: 14 (ref 5–15)
BUN: 13 mg/dL (ref 8–23)
CO2: 18 mmol/L — ABNORMAL LOW (ref 22–32)
Calcium: 9.5 mg/dL (ref 8.9–10.3)
Chloride: 106 mmol/L (ref 98–111)
Creatinine, Ser: 0.67 mg/dL (ref 0.44–1.00)
GFR, Estimated: >60 mL/min (ref >60)
Glucose, Bld: 130 mg/dL — ABNORMAL HIGH (ref 70–99)
Potassium: 3.8 mmol/L (ref 3.5–5.1)
Sodium: 138 mmol/L (ref 135–145)
Total Bilirubin: 0.3 mg/dL (ref 0.0–1.2)
Total Protein: 7.4 g/dL (ref 6.5–8.1)

## 2024-06-26 LAB — LIPASE, BLOOD: Lipase: 38 U/L (ref 11–51)

## 2024-06-26 MED ORDER — MORPHINE SULFATE (PF) 4 MG/ML IV SOLN
4.0000 mg | Freq: Once | INTRAVENOUS | Status: AC
  Administered 2024-06-26: 4 mg via INTRAVENOUS
  Filled 2024-06-26: qty 1

## 2024-06-26 MED ORDER — HYDROMORPHONE HCL 1 MG/ML IJ SOLN
1.0000 mg | Freq: Once | INTRAMUSCULAR | Status: AC
  Administered 2024-06-26: 1 mg via INTRAVENOUS
  Filled 2024-06-26: qty 1

## 2024-06-26 MED ORDER — IOHEXOL 300 MG/ML  SOLN
100.0000 mL | Freq: Once | INTRAMUSCULAR | Status: AC | PRN
  Administered 2024-06-26: 100 mL via INTRAVENOUS

## 2024-06-26 MED ORDER — ONDANSETRON HCL 4 MG/2ML IJ SOLN
4.0000 mg | Freq: Once | INTRAMUSCULAR | Status: AC
  Administered 2024-06-26: 4 mg via INTRAVENOUS
  Filled 2024-06-26: qty 2

## 2024-06-26 NOTE — ED Triage Notes (Signed)
 Patient c/o LLQ abdominal pain x 2 days. Patient report nausea , vomiting x 4  and loose stool x6 today. Patient denies chest pain and SOB.

## 2024-06-26 NOTE — ED Notes (Signed)
 Patient transported to CT

## 2024-06-26 NOTE — Discharge Instructions (Signed)
 Rest and take it easy over the weekend.  Gentle diet for now, advance as tolerated.  Make sure to stay hydrated. Follow-up with Dr. Burnette-- I would call his office on Monday to schedule an appt. Return here for new concerns.

## 2024-06-26 NOTE — ED Provider Notes (Signed)
  EMERGENCY DEPARTMENT AT [facility] Provider Note   CSN: [id number] Arrival date & time: 06/25/24  2341     Patient presents with: Abdominal Pain   Erin Wolf is a 68 y.o. female.   The history is provided by the patient and medical records.  Abdominal Pain  68 year old female with history of recurrent diverticulitis, hepatitis C, uterine fibroids, hyperlipidemia, hypertension, diabetes, history of IBS, presenting to the ED with recurrent abdominal pain.  States it has been ongoing for the past 2 days.  She reports some loose stools with mucous but non-bloody.  Some nausea but denies vomiting.  States appetite has been off due to the nausea.  No fever/chills.  States she is followed by GI, Dr. Burnette, and has been told she likely needs partial colectomy of bowel due to recurrent diverticulitis.  She has not yet met with surgeon about this as she just had cataract surgery earlier this week-- doing fine from that standpoint.  Prior to Admission medications   Medication Sig Start Date End Date Taking? Authorizing Provider  ezetimibe  (ZETIA ) 10 MG tablet Take 10 mg by mouth in the morning. 04/07/24   [provider]  FLUoxetine  (PROZAC ) 20 MG capsule Take 20 mg by mouth in the morning.    [provider]  hydrOXYzine  (ATARAX ) 10 MG tablet Take 1 tablet (10 mg total) by mouth 3 (three) times daily as needed for anxiety or itching. Patient not taking: Reported on 05/23/2024 04/01/24   Cheryle Page, MD  linaclotide  (LINZESS ) 72 MCG capsule Take 72 mcg by mouth daily as needed (constipation).    [provider]  Melatonin 10 MG TABS Take 1 tablet by mouth at bedtime as needed (sleep).    [provider]  Multiple Vitamins-Minerals (CENTRUM WOMEN) TABS Take 1 tablet by mouth daily with breakfast.    [provider]  naloxone  (NARCAN ) nasal spray 4 mg/0.1 mL Place 1 spray into the nose once as needed (opioid overdose).     [provider]  ondansetron  (ZOFRAN ) 4 MG tablet Take 1 tablet (4 mg total) by mouth every 6 (six) hours as needed for nausea. 04/01/24   Cheryle Page, MD  ondansetron  (ZOFRAN -ODT) 4 MG disintegrating tablet Take 4 mg by mouth every 8 (eight) hours as needed for nausea or vomiting.    [provider]  Oxycodone  HCl 20 MG TABS Take 20 mg by mouth 4 (four) times daily as needed (Pain).    [provider]  Polyethyl Glycol-Propyl Glycol (SYSTANE OP) Place 1 drop into both eyes 2 (two) times daily as needed (dry eyes).    [provider]  rosuvastatin  (CRESTOR ) 10 MG tablet Take 10 mg by mouth at bedtime.    [provider]    Allergies: Celecoxib, Gabapentin, Latex, Nsaids, Penicillins, Wellbutrin [bupropion hcl], Aspirin , Darvocet [propoxyphene n-acetaminophen ], Ketorolac  tromethamine , Tape, Tylenol  [acetaminophen ], Butalbital-apap-caffeine, Ciprofloxacin , Eszopiclone, Other, Topiramate, and Wound dressing adhesive    Review of Systems  Gastrointestinal:  Positive for abdominal pain.  All other systems reviewed and are negative.   Updated Vital Signs BP 124/76 (BP Location: Right Arm)   Pulse (!) 113   Temp 98.2 F (36.8 C) (Oral)   Resp 20   SpO2 99%   Physical Exam Vitals and nursing note reviewed.  Constitutional:      Appearance: She is well-developed.  HENT:     Head: Normocephalic and atraumatic.  Eyes:     Conjunctiva/sclera: Conjunctivae normal.  Pupils: Pupils are equal, round, and reactive to light.  Cardiovascular:     Rate and Rhythm: Normal rate and regular rhythm.     Heart sounds: Normal heart sounds.  Pulmonary:     Effort: Pulmonary effort is normal.     Breath sounds: Normal breath sounds.  Abdominal:     General: Bowel sounds are normal.     Palpations: Abdomen is soft.     Tenderness: There is abdominal tenderness in the periumbilical area and left lower quadrant.  Musculoskeletal:        General: Normal  range of motion.     Cervical back: Normal range of motion.  Skin:    General: Skin is warm and dry.  Neurological:     Mental Status: She is alert and oriented to person, place, and time.     (all labs ordered are listed, but only abnormal results are displayed) Labs Reviewed  COMPREHENSIVE METABOLIC PANEL WITH GFR - Abnormal; Notable for the following components:      Result Value   CO2 18 (*)    Glucose, Bld 130 (*)    All other components within normal limits  CBC - Abnormal; Notable for the following components:   RBC 5.17 (*)    MCH 25.5 (*)    MCHC 29.7 (*)    All other components within normal limits  URINALYSIS, ROUTINE W REFLEX MICROSCOPIC - Abnormal; Notable for the following components:   APPearance HAZY (*)    Leukocytes,Ua TRACE (*)    Bacteria, UA RARE (*)    All other components within normal limits  LIPASE, BLOOD    EKG: None  Radiology: CT ABDOMEN PELVIS W CONTRAST Result Date: 06/26/2024 CLINICAL DATA:  Left lower quadrant pain. Prior history of diverticulitis. EXAM: CT ABDOMEN AND PELVIS WITH CONTRAST TECHNIQUE: Multidetector CT imaging of the abdomen and pelvis was performed using the standard protocol following bolus administration of intravenous contrast. RADIATION DOSE REDUCTION: This exam was performed according to the departmental dose-optimization program which includes automated exposure control, adjustment of the mA and/or kV according to patient size and/or use of iterative reconstruction technique. CONTRAST:  OMNIPAQUE  IOHEXOL  300 MG/ML  SOLN COMPARISON:  Large number of prior CTs back to 2004. The 2 most recent are both with contrast dated 05/23/2024 and 04/27/2024. FINDINGS: Lower chest: No acute abnormality. Minimal pericardial effusion, chronic. Normal cardiac size. Clear visualized lung bases with chronically elevated right hemidiaphragm. Hepatobiliary: 18.3 cm in length mildly steatotic liver without mass enhancement. There is post  cholecystectomy extrahepatic and intrahepatic biliary dilatation. The intrahepatic bile duct dilatation has increased since last month and the month before, but the extrahepatic biliary dilatation is unchanged with the common duct 1.3 cm. Laboratory and clinical correlation advised. There is no appreciable filling defect in the duct. In the proximal duodenum however, which was previously contracted and not well seen and is more fluid-filled today, a rounded mass may be developing overlying the ampulla on images 2:36 and 8:73 measuring 1.4 x 1.2 x 1.8 cm. MRI without and with contrast with MRCP is recommended to further evaluate this. If this is a persistent finding on further imaging then endoscopy will be needed. Pancreas: Increasing dilatation of the proximal pancreatic duct is seen now measuring 9.5 mm proximally, previously 8.5 mm. Less pronounced ductal dilatation continues into the body and tail segments. No pancreatic mass or inflammatory changes are seen. Spleen: No abnormality. 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 No  right adrenal mass. Normal kidneys without mass enhancement, urinary stone or obstruction. The bladder and both terminal ureters however, are completely obscured by spray artifact from her left hip replacement. Stomach/Bowel: Stable contracted appearance of stomach. Normal caliber unopacified small bowel. Mobile cecum. An appendix is not seen in this patient. Descending and sigmoid diverticulosis is again noted without evidence of an acute diverticulitis. Vascular/Lymphatic: Aortic atherosclerosis. No enlarged abdominal or pelvic lymph nodes. Reproductive: Status post hysterectomy. No adnexal masses. Numerous pelvic phleboliths. Other: None. Musculoskeletal: Left total hip replacement. Advanced degenerative change lower lumbar spine from L3 down with acquired foraminal stenosis, acquired spinal stenosis at L3-4 and L4-5. No acute or other  significant osseous findings. IMPRESSION: 1. Increasing intrahepatic biliary dilatation, with stable extrahepatic biliary dilatation. 2. Increasing dilatation of the proximal pancreatic duct. 3. Possible developing 1.4 x 1.2 x 1.8 cm mass in the proximal duodenum overlying the ampulla. MRI without and with contrast with MRCP is recommended to further evaluate this. If this is a persistent finding on further imaging then endoscopy will be needed. 4. Diverticulosis without evidence of diverticulitis. No findings to explain the left lower quadrant pain. 5. Aortic atherosclerosis. 6. 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 7. Advanced degenerative change lower lumbar spine with acquired spinal stenosis at L3-4 and L4-5. Aortic Atherosclerosis (ICD10-I70.0). Electronically Signed   By: Francis Quam M.D.   On: 06/26/2024 03:42     Procedures   Medications Ordered in the ED  HYDROmorphone  (DILAUDID ) injection 1 mg (has no administration in time range)  ondansetron  (ZOFRAN ) injection 4 mg (has no administration in time range)                                    Medical Decision Making Amount and/or Complexity of Data Reviewed Labs: ordered. Radiology: ordered and independent interpretation performed. ECG/medicine tests: ordered and independent interpretation performed.  Risk Prescription drug management.   67 year old female presenting to the ED with left lower abdominal pain.  History of recurrent diverticulitis.  She is followed closely by GI, Dr. Burnette.  Reports symptoms recurred 2 days ago.  Reports some mucousy stools but nonbloody.  No fever or chills.  She is locally tender on exam but no peritoneal signs.  Will obtain labs, CT.  Meds ordered.  Labs as above--no leukocytosis or electrolyte derangement.  Normal LFTs.  Normal lipase.  UA without any signs of infection.  CT obtained--has some ductal dilation, this has been seen previously and overall appears stable.  Also has findings of  questionable mass about the ampulla.  This is also been present since 2010, she has had MRI/MRCP and EUS x 2 with Dr. Burnette without any findings of mass.  On last EUS 2021, he did not feel this needed to be evaluated further.   At this time, no clear etiology of her symptoms.  Patient does report she is feeling better.  She feels relieved after we have discussed her results from today.  I recommended that she follow-up closely with Dr. Burnette in GI clinic.  Can return here for any new or acute changes.  Case discussed with attending physician, Dr. Trine, who has reviewed relevant testing and agrees with plan of care.  Final diagnoses:  Left lower quadrant abdominal pain    ED Discharge Orders     None          Jarold Olam HERO, PA-C 06/26/24 [id number]  Trine Raynell Moder, MD 06/26/24 [phone]

## 2024-06-29 ENCOUNTER — Telehealth: Payer: Self-pay | Admitting: *Deleted

## 2024-07-23 ENCOUNTER — Emergency Department (HOSPITAL_COMMUNITY)

## 2024-07-23 ENCOUNTER — Inpatient Hospital Stay (HOSPITAL_COMMUNITY)
Admission: EM | Admit: 2024-07-23 | Discharge: 2024-07-25 | DRG: 387 | Disposition: A | Discharge status: Home / Self Care | Attending: Internal Medicine | Admitting: Internal Medicine

## 2024-07-23 ENCOUNTER — Encounter (HOSPITAL_COMMUNITY): Payer: Self-pay | Admitting: Family Medicine

## 2024-07-23 ENCOUNTER — Other Ambulatory Visit: Payer: Self-pay

## 2024-07-23 DIAGNOSIS — Z820 Family history of epilepsy and other diseases of the nervous system: Secondary | ICD-10-CM

## 2024-07-23 DIAGNOSIS — K219 Gastro-esophageal reflux disease without esophagitis: Secondary | ICD-10-CM | POA: Diagnosis not present

## 2024-07-23 DIAGNOSIS — Z9049 Acquired absence of other specified parts of digestive tract: Secondary | ICD-10-CM

## 2024-07-23 DIAGNOSIS — M329 Systemic lupus erythematosus, unspecified: Secondary | ICD-10-CM | POA: Diagnosis present

## 2024-07-23 DIAGNOSIS — E119 Type 2 diabetes mellitus without complications: Secondary | ICD-10-CM | POA: Diagnosis present

## 2024-07-23 DIAGNOSIS — Z803 Family history of malignant neoplasm of breast: Secondary | ICD-10-CM

## 2024-07-23 DIAGNOSIS — I10 Essential (primary) hypertension: Secondary | ICD-10-CM | POA: Diagnosis present

## 2024-07-23 DIAGNOSIS — Z79899 Other long term (current) drug therapy: Secondary | ICD-10-CM

## 2024-07-23 DIAGNOSIS — Z800 Family history of malignant neoplasm of digestive organs: Secondary | ICD-10-CM

## 2024-07-23 DIAGNOSIS — Z90710 Acquired absence of both cervix and uterus: Secondary | ICD-10-CM

## 2024-07-23 DIAGNOSIS — K51911 Ulcerative colitis, unspecified with rectal bleeding: Principal | ICD-10-CM | POA: Diagnosis present

## 2024-07-23 DIAGNOSIS — K51919 Ulcerative colitis, unspecified with unspecified complications: Secondary | ICD-10-CM | POA: Diagnosis not present

## 2024-07-23 DIAGNOSIS — K3189 Other diseases of stomach and duodenum: Secondary | ICD-10-CM | POA: Diagnosis present

## 2024-07-23 DIAGNOSIS — F419 Anxiety disorder, unspecified: Secondary | ICD-10-CM | POA: Diagnosis present

## 2024-07-23 DIAGNOSIS — E785 Hyperlipidemia, unspecified: Secondary | ICD-10-CM | POA: Diagnosis present

## 2024-07-23 DIAGNOSIS — Z96653 Presence of artificial knee joint, bilateral: Secondary | ICD-10-CM | POA: Diagnosis present

## 2024-07-23 DIAGNOSIS — R1084 Generalized abdominal pain: Principal | ICD-10-CM

## 2024-07-23 DIAGNOSIS — K5190 Ulcerative colitis, unspecified, without complications: Secondary | ICD-10-CM | POA: Diagnosis present

## 2024-07-23 DIAGNOSIS — Z8249 Family history of ischemic heart disease and other diseases of the circulatory system: Secondary | ICD-10-CM

## 2024-07-23 DIAGNOSIS — E669 Obesity, unspecified: Secondary | ICD-10-CM | POA: Diagnosis present

## 2024-07-23 DIAGNOSIS — Z6836 Body mass index (BMI) 36.0-36.9, adult: Secondary | ICD-10-CM

## 2024-07-23 DIAGNOSIS — K5730 Diverticulosis of large intestine without perforation or abscess without bleeding: Secondary | ICD-10-CM | POA: Diagnosis present

## 2024-07-23 DIAGNOSIS — Z7985 Long-term (current) use of injectable non-insulin antidiabetic drugs: Secondary | ICD-10-CM

## 2024-07-23 DIAGNOSIS — G40909 Epilepsy, unspecified, not intractable, without status epilepticus: Secondary | ICD-10-CM | POA: Diagnosis present

## 2024-07-23 DIAGNOSIS — R4589 Other symptoms and signs involving emotional state: Secondary | ICD-10-CM | POA: Diagnosis present

## 2024-07-23 DIAGNOSIS — Z96642 Presence of left artificial hip joint: Secondary | ICD-10-CM | POA: Diagnosis present

## 2024-07-23 LAB — BASIC METABOLIC PANEL WITH GFR
Anion gap: 12 (ref 5–15)
BUN: 10 mg/dL (ref 8–23)
CO2: 22 mmol/L (ref 22–32)
Calcium: 9.7 mg/dL (ref 8.9–10.3)
Chloride: 105 mmol/L (ref 98–111)
Creatinine, Ser: 0.71 mg/dL (ref 0.44–1.00)
GFR, Estimated: >60 mL/min (ref >60)
Glucose, Bld: 80 mg/dL (ref 70–99)
Potassium: 3.7 mmol/L (ref 3.5–5.1)
Sodium: 139 mmol/L (ref 135–145)

## 2024-07-23 LAB — CBC WITH DIFFERENTIAL/PLATELET
Abs Immature Granulocytes: 0.02 K/uL (ref 0.00–0.07)
Basophils Absolute: 0 K/uL (ref 0.0–0.1)
Basophils Relative: 0 %
Eosinophils Absolute: 0.1 K/uL (ref 0.0–0.5)
Eosinophils Relative: 2 %
HCT: 45.5 % (ref 36.0–46.0)
Hemoglobin: 13.9 g/dL (ref 12.0–15.0)
Immature Granulocytes: 0 %
Lymphocytes Relative: 49 %
Lymphs Abs: 2.7 K/uL (ref 0.7–4.0)
MCH: 25.8 pg — ABNORMAL LOW (ref 26.0–34.0)
MCHC: 30.5 g/dL (ref 30.0–36.0)
MCV: 84.4 fL (ref 80.0–100.0)
Monocytes Absolute: 0.5 K/uL (ref 0.1–1.0)
Monocytes Relative: 8 %
Neutro Abs: 2.4 K/uL (ref 1.7–7.7)
Neutrophils Relative %: 41 %
Platelets: 330 K/uL (ref 150–400)
RBC: 5.39 MIL/uL — ABNORMAL HIGH (ref 3.87–5.11)
RDW: 15.2 % (ref 11.5–15.5)
WBC: 5.7 K/uL (ref 4.0–10.5)
nRBC: 0 % (ref 0.0–0.2)

## 2024-07-23 LAB — LIPASE, BLOOD: Lipase: 22 U/L (ref 11–51)

## 2024-07-23 LAB — C-REACTIVE PROTEIN: CRP: <0.5 mg/dL (ref <1.0)

## 2024-07-23 LAB — SEDIMENTATION RATE: Sed Rate: 51 mm/h — ABNORMAL HIGH (ref 0–22)

## 2024-07-23 MED ORDER — SODIUM CHLORIDE 0.9 % IV SOLN: INTRAVENOUS | Status: AC

## 2024-07-23 MED ORDER — EZETIMIBE 10 MG PO TABS
10.0000 mg | ORAL_TABLET | Freq: Every day | ORAL | Status: DC
  Administered 2024-07-24 – 2024-07-25 (×2): 10 mg via ORAL
  Filled 2024-07-23 (×2): qty 1

## 2024-07-23 MED ORDER — METHYLPREDNISOLONE SODIUM SUCC 125 MG IJ SOLR
60.0000 mg | Freq: Every day | INTRAMUSCULAR | Status: DC
  Administered 2024-07-23 – 2024-07-24 (×2): 60 mg via INTRAVENOUS
  Filled 2024-07-23 (×2): qty 2

## 2024-07-23 MED ORDER — ROSUVASTATIN CALCIUM 10 MG PO TABS: 10.0000 mg | ORAL_TABLET | Freq: Every day | ORAL | Status: DC

## 2024-07-23 MED ORDER — DIPHENHYDRAMINE HCL 50 MG/ML IJ SOLN
12.5000 mg | Freq: Once | INTRAMUSCULAR | Status: AC
  Administered 2024-07-23: 12.5 mg via INTRAVENOUS
  Filled 2024-07-23: qty 1

## 2024-07-23 MED ORDER — LACTATED RINGERS IV BOLUS
1000.0000 mL | Freq: Once | INTRAVENOUS | Status: AC
  Administered 2024-07-23: 1000 mL via INTRAVENOUS

## 2024-07-23 MED ORDER — ONDANSETRON HCL 4 MG PO TABS: 4.0000 mg | ORAL_TABLET | Freq: Four times a day (QID) | ORAL | Status: DC | PRN

## 2024-07-23 MED ORDER — METOCLOPRAMIDE HCL 5 MG/ML IJ SOLN
5.0000 mg | Freq: Once | INTRAMUSCULAR | Status: AC
  Administered 2024-07-23: 5 mg via INTRAVENOUS
  Filled 2024-07-23: qty 2

## 2024-07-23 MED ORDER — HYDROMORPHONE HCL 1 MG/ML IJ SOLN
0.5000 mg | Freq: Once | INTRAMUSCULAR | Status: AC
  Administered 2024-07-23: 0.5 mg via INTRAVENOUS
  Filled 2024-07-23: qty 1

## 2024-07-23 MED ORDER — HYDROMORPHONE HCL 1 MG/ML IJ SOLN
1.0000 mg | INTRAMUSCULAR | Status: DC | PRN
  Administered 2024-07-23 – 2024-07-25 (×7): 1 mg via INTRAVENOUS
  Filled 2024-07-23 (×7): qty 1

## 2024-07-23 MED ORDER — ONDANSETRON HCL 4 MG/2ML IJ SOLN
4.0000 mg | Freq: Four times a day (QID) | INTRAMUSCULAR | Status: DC | PRN
  Administered 2024-07-24 – 2024-07-25 (×3): 4 mg via INTRAVENOUS
  Filled 2024-07-23 (×3): qty 2

## 2024-07-23 MED ORDER — LORAZEPAM 2 MG/ML IJ SOLN
0.5000 mg | Freq: Once | INTRAMUSCULAR | Status: AC
  Administered 2024-07-23: 0.5 mg via INTRAVENOUS
  Filled 2024-07-23: qty 1

## 2024-07-23 MED ORDER — LACTATED RINGERS IV SOLN: INTRAVENOUS | Status: DC

## 2024-07-23 MED ORDER — HYDROXYZINE HCL 10 MG PO TABS
10.0000 mg | ORAL_TABLET | Freq: Three times a day (TID) | ORAL | Status: DC | PRN
  Administered 2024-07-23: 10 mg via ORAL
  Filled 2024-07-23 (×2): qty 1

## 2024-07-23 MED ORDER — ADULT MULTIVITAMIN W/MINERALS CH
1.0000 | ORAL_TABLET | Freq: Every day | ORAL | Status: DC
  Administered 2024-07-24 – 2024-07-25 (×2): 1 via ORAL
  Filled 2024-07-23 (×2): qty 1

## 2024-07-23 MED ORDER — HYDROMORPHONE HCL 1 MG/ML IJ SOLN
1.0000 mg | Freq: Once | INTRAMUSCULAR | Status: AC
  Administered 2024-07-23: 1 mg via INTRAVENOUS
  Filled 2024-07-23: qty 1

## 2024-07-23 MED ORDER — FLUOXETINE HCL 20 MG PO CAPS
20.0000 mg | ORAL_CAPSULE | Freq: Every day | ORAL | Status: DC
  Administered 2024-07-24 – 2024-07-25 (×2): 20 mg via ORAL
  Filled 2024-07-23 (×2): qty 1

## 2024-07-23 NOTE — Assessment & Plan Note (Signed)
 Worsening generalized abdominal pain, decreased po intake and bloody streaked BMs over multiple days in setting of baseline UC  CT A&P grossly stable today  Checking inflammatory markers to correlate  Pt reports similar episodes requiring overnight course of IVF and IV steroids  Will place on short course of IV solumedrol  Cont IVF  Pain control  Anti emetics  Follow closely

## 2024-07-23 NOTE — Plan of Care (Signed)

## 2024-07-23 NOTE — Assessment & Plan Note (Signed)
 PPI

## 2024-07-23 NOTE — ED Provider Notes (Signed)
 [place] EMERGENCY DEPARTMENT AT [facility] Provider Note   CSN: [id number] Arrival date & time: 07/23/24  1034     Patient presents with: Abdominal Pain   Erin Wolf is a 68 y.o. female.   68 year old female with history of recurrent diverticulitis, ulcerative colitis, chronic abdominal pain presents worsening abdominal discomfort times several days.  Patient states that she has been using her home oxycodone  without relief.  Is also been prescribed Zofran  and promethazine  for nausea which she says has not been working.  Denies any fever.  States that her emesis has been nonbilious or bloody.  Her diarrhea has been watery.  Abdominal pain is diffuse and nonlocalizing similar to her prior chronic pain.  Denies any urinary symptoms.       Prior to Admission medications   Medication Sig Start Date End Date Taking? Authorizing Provider  ezetimibe  (ZETIA ) 10 MG tablet Take 10 mg by mouth in the morning. 04/07/24   [provider]  FLUoxetine  (PROZAC ) 20 MG capsule Take 20 mg by mouth in the morning.    [provider]  hydrOXYzine  (ATARAX ) 10 MG tablet Take 1 tablet (10 mg total) by mouth 3 (three) times daily as needed for anxiety or itching. Patient not taking: Reported on 05/23/2024 04/01/24   Cheryle Page, MD  linaclotide  (LINZESS ) 72 MCG capsule Take 72 mcg by mouth daily as needed (constipation).    [provider]  Melatonin 10 MG TABS Take 1 tablet by mouth at bedtime as needed (sleep).    [provider]  Multiple Vitamins-Minerals (CENTRUM WOMEN) TABS Take 1 tablet by mouth daily with breakfast.    [provider]  naloxone  (NARCAN ) nasal spray 4 mg/0.1 mL Place 1 spray into the nose once as needed (opioid overdose).    [provider]  ondansetron  (ZOFRAN ) 4 MG tablet Take 1 tablet (4 mg total) by mouth every 6 (six) hours as needed for nausea. 04/01/24   Cheryle Page, MD  ondansetron  (ZOFRAN -ODT) 4 MG  disintegrating tablet Take 4 mg by mouth every 8 (eight) hours as needed for nausea or vomiting.    [provider]  Oxycodone  HCl 20 MG TABS Take 20 mg by mouth 4 (four) times daily as needed (Pain).    [provider]  Polyethyl Glycol-Propyl Glycol (SYSTANE OP) Place 1 drop into both eyes 2 (two) times daily as needed (dry eyes).    [provider]  rosuvastatin  (CRESTOR ) 10 MG tablet Take 10 mg by mouth at bedtime.    [provider]    Allergies: Celecoxib, Gabapentin, Latex, Nsaids, Penicillins, Wellbutrin [bupropion hcl], Aspirin , Darvocet [propoxyphene n-acetaminophen ], Ketorolac  tromethamine , Tape, Tylenol  [acetaminophen ], Butalbital-apap-caffeine, Ciprofloxacin , Eszopiclone, Other, Topiramate, and Wound dressing adhesive    Review of Systems  All other systems reviewed and are negative.   Updated Vital Signs BP (!) 123/91 (BP Location: Left Arm)   Pulse 60   Temp 98.1 F (36.7 C) (Oral)   Resp 16   SpO2 99%   Physical Exam Vitals and nursing note reviewed.  Constitutional:      General: She is not in acute distress.    Appearance: Normal appearance. She is well-developed. She is not toxic-appearing.  HENT:     Head: Normocephalic and atraumatic.  Eyes:     General: Lids are normal.     Conjunctiva/sclera: Conjunctivae normal.     Pupils: Pupils are equal, round, and reactive to light.  Neck:     Thyroid : No thyroid   mass.     Trachea: No tracheal deviation.  Cardiovascular:     Rate and Rhythm: Normal rate and regular rhythm.     Heart sounds: Normal heart sounds. No murmur heard.    No gallop.  Pulmonary:     Effort: Pulmonary effort is normal. No respiratory distress.     Breath sounds: Normal breath sounds. No stridor. No decreased breath sounds, wheezing, rhonchi or rales.  Abdominal:     General: There is no distension.     Palpations: Abdomen is soft.     Tenderness: There is generalized abdominal tenderness. There is no  guarding or rebound.  Musculoskeletal:        General: No tenderness. Normal range of motion.     Cervical back: Normal range of motion and neck supple.  Skin:    General: Skin is warm and dry.     Findings: No abrasion or rash.  Neurological:     Mental Status: She is alert and oriented to person, place, and time. Mental status is at baseline.     GCS: GCS eye subscore is 4. GCS verbal subscore is 5. GCS motor subscore is 6.     Cranial Nerves: No cranial nerve deficit.     Sensory: No sensory deficit.     Motor: Motor function is intact.  Psychiatric:        Attention and Perception: Attention normal.        Speech: Speech normal.        Behavior: Behavior normal.     (all labs ordered are listed, but only abnormal results are displayed) Labs Reviewed  LIPASE, BLOOD  CBC WITH DIFFERENTIAL/PLATELET  BASIC METABOLIC PANEL WITH GFR    EKG: None  Radiology: No results found.   Procedures   Medications Ordered in the ED  lactated ringers  bolus 1,000 mL (has no administration in time range)  lactated ringers  infusion (has no administration in time range)  metoCLOPramide  (REGLAN ) injection 5 mg (has no administration in time range)  diphenhydrAMINE  (BENADRYL ) injection 12.5 mg (has no administration in time range)  HYDROmorphone  (DILAUDID ) injection 0.5 mg (has no administration in time range)                                    Medical Decision Making Amount and/or Complexity of Data Reviewed Labs: ordered. Radiology: ordered.  Risk Prescription drug management.   Patients labs show no leukocytosis.  IV hydration here.  CT was without evidence of obstruction but does show an area of the duodenum concerning for possible mass.  Patient will need inpatient MRI.  Given pain medication and still is uncomfortable at this time.  Will require hospitalization.  Will consult hospitalist team     Final diagnoses:  None    ED Discharge Orders     None           Dasie Faden, MD 07/23/24 1300

## 2024-07-23 NOTE — Assessment & Plan Note (Signed)
 BP stable Titrate home regimen

## 2024-07-23 NOTE — ED Notes (Signed)
 Patient ambulated to restroom and provided very minimal amount of urine sample. Sent to lab.

## 2024-07-23 NOTE — ED Triage Notes (Signed)
 C/o lower abdominal pain starting Wednesday. N/V/D. No OTC meds taken at home.

## 2024-07-23 NOTE — Assessment & Plan Note (Signed)
 Cont prozac  and prn atarax  as po status tolerates

## 2024-07-23 NOTE — Assessment & Plan Note (Signed)
 Noted duodenal mass on CT scan today Given recurrent abdominal pain, will check MRI of the abdomen to better assess Otherwise monitor

## 2024-07-23 NOTE — H&P (Signed)
 History and Physical    Patient: Erin Wolf FMW:[id number] DOB: Apr 29, 1956 DOA: 07/23/2024 DOS: the patient was seen and examined on 07/23/2024 PCP: Jerome Heron Ruth, PA-C  Patient coming from: Home  Chief Complaint:  Chief Complaint  Patient presents with   Abdominal Pain   HPI: Erin Wolf is a 68 y.o. female with medical history significant of anxiety, GERD, ulcerative colitis, hypertension presented with ulcerative colitis flare.  Patient reports generalized abdominal pain dyspnea recurring over several days to weeks.  Noted to have been admitted July 2025 for similar issues with noted diagnosis of diverticulitis.  Received course of IV antibiotics with symptomatic improvement.  Per the patient, she is followed by [facility]   Patient states she is in discussions for outpatient partial colectomy for ulcerative colitis.  Patient reports worsening generalized pain, vomiting, bloody stools over roughly 1 week.  Patient states she usually requires roughly  1 to 2 days of IV steroids and IV fluids to symptomatically improved.  Does not appear to be on active IBD regimen. Presented to the ER afebrile, hemodynamically stable.  Satting well on room air.  White count 5.7, hemoglobin 13.9, platelets 330, creatinine 0.7.  CT abdomen pelvis noted solid abnormality in region of ampulla and second portion duodenum concerning for possible mass which is noted on prior imaging.  Diverticulosis without diverticulitis. Review of Systems: As mentioned in the history of present illness. All other systems reviewed and are negative. Past Medical History:  Diagnosis Date   Anxiety    Arthritis    Chronic abdominal pain    Chronic lower back pain    Chronic nausea    Depression    Diabetes mellitus without complication (HCC)    not on meds   Diverticulosis    Dyspnea    with exertion   Frequency of urination    GERD (gastroesophageal reflux disease)    Headache    Hematuria    Hepatitis     pt denies   Hepatitis C antibody positive in blood    per pt told by pcp   History of panic attacks    History of syncope    hx recurrent syncope --- per epic domentation non-cardiac , orthostatic hypotension, anxiety, dehydration   History of uterine fibroid    HTN (hypertension), benign    Hyperlipidemia 01/03/2012   IBS (irritable bowel syndrome)    Myalgia    Sciatic nerve disease, left 2023   Seizure disorder (HCC) followed by pcp until new neurologist appr. in jan 2019   started having them in my 20's ,  petit mal ----  per pt last seizure one 2015   SLE (systemic lupus erythematosus) [facility])    rheumatologist-- dr ziolkowska (consult 11-12-2016) having work-up done   Ulcerative colitis    followed by dr lennard at [facility]   Wears glasses    Past Surgical History:  Procedure Laterality Date   ABDOMINAL HYSTERECTOMY  1988   ARTERY BIOPSY Left 06/11/2022   Procedure: LEFT BIOPSY TEMPORAL ARTERY;  Surgeon: Stevie Herlene Righter, MD;  Location: [facility];  Service: General;  Laterality: Left;   CHOLECYSTECTOMY OPEN  1978   COLONOSCOPY N/A 05/31/2013   Procedure: COLONOSCOPY;  Surgeon: Lesta JULIANNA lennard, MD;  Location: [facility] ENDOSCOPY;  Service: Endoscopy;  Laterality: N/A;   CYSTOSCOPY/RETROGRADE/URETEROSCOPY Bilateral 07/23/2017   Procedure: CYSTOSCOPY/RETROGRADE/URETEROSCOPY;  Surgeon: Devere Lonni Righter, MD;  Location: [facility];  Service: Urology;  Laterality: Bilateral;   detached retina on right  eye surgery      ESOPHAGOGASTRODUODENOSCOPY (EGD) WITH PROPOFOL  N/A 05/11/2014   Procedure: ESOPHAGOGASTRODUODENOSCOPY (EGD) WITH PROPOFOL ;  Surgeon: Jerrell KYM Sol, MD;  Location: [facility];  Service: Endoscopy;  Laterality: N/A;  egd first   ESOPHAGOGASTRODUODENOSCOPY (EGD) WITH PROPOFOL  N/A 05/08/2020   Procedure: ESOPHAGOGASTRODUODENOSCOPY (EGD) WITH PROPOFOL ;  Surgeon: Burnette Fallow, MD;  Location: [facility];  Service: Endoscopy;  Laterality: N/A;   EUS  N/A 06/21/2016   Procedure: ESOPHAGEAL ENDOSCOPIC ULTRASOUND (EUS) RADIAL;  Surgeon: Fallow Burnette, MD;  Location: [facility];  Service: Endoscopy;  Laterality: N/A;   FLEXIBLE SIGMOIDOSCOPY N/A 05/11/2014   Procedure: FLEXIBLE SIGMOIDOSCOPY;  Surgeon: Jerrell KYM Sol, MD;  Location: [facility];  Service: Endoscopy;  Laterality: N/A;   LAPAROTOMY W/ BILATERAL SALPINGOOPHORECTOMY  09-26-2003   dr johnnye at [facility]   LAPROSCOPY W/ LYSIS ADHESIONS  06-08-2003   dr leva [facility]   TOTAL HIP ARTHROPLASTY Left 12/03/2022   Procedure: TOTAL HIP ARTHROPLASTY ANTERIOR APPROACH;  Surgeon: Ernie Cough, MD;  Location: [facility];  Service: Orthopedics;  Laterality: Left;   TOTAL KNEE ARTHROPLASTY Left 12/19/2020   Procedure: TOTAL KNEE ARTHROPLASTY;  Surgeon: Ernie Cough, MD;  Location: [facility];  Service: Orthopedics;  Laterality: Left;  70 mins   TOTAL KNEE ARTHROPLASTY Right 03/27/2021   Procedure: TOTAL KNEE ARTHROPLASTY;  Surgeon: Ernie Cough, MD;  Location: [facility];  Service: Orthopedics;  Laterality: Right;  70 mins   TRANSTHORACIC ECHOCARDIOGRAM  04/29/2016   ef 60-65%,  grade 2 diastolic dysfunction/  trivial MR and TR   TUBAL LIGATION Bilateral yrs ago   UPPER ESOPHAGEAL ENDOSCOPIC ULTRASOUND (EUS) N/A 05/08/2020   Procedure: UPPER ESOPHAGEAL ENDOSCOPIC ULTRASOUND (EUS);  Surgeon: Burnette Fallow, MD;  Location: [facility];  Service: Endoscopy;  Laterality: N/A;   Social History:  reports that she has never smoked. She has never used smokeless tobacco. She reports that she does not drink alcohol  and does not use drugs.  Allergies  Allergen Reactions   Celecoxib Nausea Only   Gabapentin Other (See Comments)    Abdominal pain   Latex Other (See Comments)    Skin peels    Nsaids Other (See Comments)    HAS COLITIS FLARES   Penicillins Hives and Other (See Comments)    Tolerates cefazolin , ceftriaxone , cephalexin , AND Augmentin    Wellbutrin [Bupropion Hcl] Other (See Comments)     Insomnia and headaches   Aspirin  Nausea Only and Other (See Comments)    GI Intolerance   Darvocet [Propoxyphene N-Acetaminophen ] Nausea And Vomiting   Ketorolac  Tromethamine  Nausea And Vomiting   Tape Hives   Tylenol  [Acetaminophen ] Nausea Only   Butalbital-Apap-Caffeine Other (See Comments)    Stomach upset   Ciprofloxacin  Other (See Comments)    Stomach upset   Eszopiclone Swelling and Other (See Comments)    Exact site of swelling not recalled   Other Diarrhea and Other (See Comments)    Spicy foods cause diarrhea due to colitis   Topiramate Other (See Comments)    Stomach upset   Wound Dressing Adhesive Hives    Family History  Problem Relation Age of Onset   Alzheimer's disease Mother    Hypertension Mother    Other Father        ruptured appendix   Breast cancer Sister    Hypertension Sister    Hypertension Sister    Hypertension Sister    Heart disease Sister    Colon cancer Brother    Hypertension Brother    Cancer Brother  Memory loss Brother     Prior to Admission medications   Medication Sig Start Date End Date Taking? Authorizing Provider  atorvastatin (LIPITOR) 10 MG tablet Take 10 mg by mouth daily.   Yes [provider]  ezetimibe  (ZETIA ) 10 MG tablet Take 10 mg by mouth in the morning. 04/07/24  Yes [provider]  FLUoxetine  (PROZAC ) 20 MG capsule Take 20 mg by mouth in the morning.   Yes [provider]  hydrOXYzine  (ATARAX ) 10 MG tablet Take 1 tablet (10 mg total) by mouth 3 (three) times daily as needed for anxiety or itching. 04/01/24  Yes Cheryle Page, MD  linaclotide  (LINZESS ) 72 MCG capsule Take 72 mcg by mouth daily as needed (constipation).   Yes [provider]  Melatonin 10 MG TABS Take 10 mg by mouth at bedtime as needed (sleep).   Yes [provider]  Multiple Vitamins-Minerals (CENTRUM WOMEN) TABS Take 1 tablet by mouth daily with breakfast.   Yes [provider]  naloxone   (NARCAN ) nasal spray 4 mg/0.1 mL Place 1 spray into the nose once as needed (opioid overdose).   Yes [provider]  ondansetron  (ZOFRAN -ODT) 4 MG disintegrating tablet Take 4 mg by mouth every 8 (eight) hours as needed for nausea or vomiting (dissolve orally).   Yes [provider]  Oxycodone  HCl 20 MG TABS Take 20 mg by mouth 4 (four) times daily.   Yes [provider]  OZEMPIC, 1 MG/DOSE, 4 MG/3ML SOPN Inject 1 mg into the skin every Monday. 07/15/24  Yes [provider]  prednisoLONE acetate (PRED FORTE) 1 % ophthalmic suspension Place 1 drop into the left eye See admin instructions. Instill 1 drop into the left eye three times a day for 5 days, then twice a day for 5 days, then once a day for 5 days, then re-visit the doctor. 07/21/24 08/05/24 Yes [provider]  promethazine  (PHENERGAN ) 12.5 MG tablet Take 12.5 mg by mouth 3 (three) times daily as needed for nausea or vomiting. 07/17/24  Yes [provider]  traMADol (ULTRAM) 50 MG tablet Take 50 mg by mouth every 6 (six) hours as needed (for pain). 07/03/24  Yes [provider]  ondansetron  (ZOFRAN ) 4 MG tablet Take 1 tablet (4 mg total) by mouth every 6 (six) hours as needed for nausea. Patient not taking: Reported on 07/23/2024 04/01/24   Cheryle Page, MD    Physical Exam: Vitals:   07/23/24 1040 07/23/24 1404 07/23/24 1427 07/23/24 1512  BP: (!) 123/91  134/72   Pulse: 60  74   Resp: 16     Temp: 98.1 F (36.7 C) 98.1 F (36.7 C) 97.9 F (36.6 C)   TempSrc: Oral Oral Oral   SpO2: 99%     Weight:    99.8 kg  Height:    5' 5 (1.651 m)   Physical Exam Constitutional:      Appearance: She is obese.  HENT:     Head: Normocephalic and atraumatic.     Nose: Nose normal.     Mouth/Throat:     Mouth: Mucous membranes are moist.  Eyes:     Pupils: Pupils are equal, round, and reactive to light.  Cardiovascular:     Rate and Rhythm: Normal rate and regular rhythm.   Pulmonary:     Effort: Pulmonary effort is normal.  Abdominal:     General: Bowel sounds are normal.  Musculoskeletal:        General: Normal range  of motion.  Skin:    General: Skin is warm.  Neurological:     General: No focal deficit present.     Data Reviewed:  There are no new results to review at this time.  CT ABDOMEN PELVIS WO CONTRAST CLINICAL DATA:  Acute lower abdominal pain.  EXAM: CT ABDOMEN AND PELVIS WITHOUT CONTRAST  TECHNIQUE: Multidetector CT imaging of the abdomen and pelvis was performed following the standard protocol without IV contrast.  RADIATION DOSE REDUCTION: This exam was performed according to the departmental dose-optimization program which includes automated exposure control, adjustment of the mA and/or kV according to patient size and/or use of iterative reconstruction technique.  COMPARISON:  June 26, 2024.  FINDINGS: Lower chest: No acute abnormality.  Hepatobiliary: No focal liver abnormality is seen. Status post cholecystectomy. Mildly dilated common bile duct is noted with minimal intrahepatic biliary dilatation.  Pancreas: Unremarkable. No pancreatic ductal dilatation or surrounding inflammatory changes.  Spleen: Normal in size without focal abnormality.  Adrenals/Urinary Tract: Stable left adrenal myelolipoma. Kidneys are normal, without renal calculi, focal lesion, or hydronephrosis. Bladder is unremarkable.  Stomach/Bowel: The stomach is unremarkable. Diverticulosis of descending and sigmoid colon is noted without definite inflammation. The appendix is not clearly visualized. There is no evidence of bowel obstruction. There is again noted solid abnormality in the region of ampulla in second portion of duodenum concerning for possible mass as described on prior CT scan. Further evaluation with MRI is recommended.  Vascular/Lymphatic: No significant vascular findings are present. No enlarged abdominal or pelvic  lymph nodes.  Reproductive: Status post hysterectomy. No adnexal masses.  Other: No abdominal wall hernia or abnormality. No abdominopelvic ascites.  Musculoskeletal: Status post left total hip arthroplasty. No acute osseous abnormality.  IMPRESSION: 1. There is again noted solid abnormality in the region of ampulla in second portion of duodenum concerning for possible mass as described on prior CT scan. Further evaluation with MRI is recommended. 2. Stable left adrenal myelolipoma. 3. Diverticulosis of descending and sigmoid colon without definite inflammation.  Electronically Signed   By: Lynwood Landy Raddle M.D.   On: 07/23/2024 11:37  Lab Results  Component Value Date   WBC 5.7 07/23/2024   HGB 13.9 07/23/2024   HCT 45.5 07/23/2024   MCV 84.4 07/23/2024   PLT 330 07/23/2024   Last metabolic panel Lab Results  Component Value Date   GLUCOSE 80 07/23/2024   NA 139 07/23/2024   K 3.7 07/23/2024   CL 105 07/23/2024   CO2 22 07/23/2024   BUN 10 07/23/2024   CREATININE 0.71 07/23/2024   GFRNONAA >60 07/23/2024   CALCIUM  9.7 07/23/2024   PHOS 3.0 03/31/2024   PROT 7.4 06/26/2024   ALBUMIN 3.8 06/26/2024   BILITOT 0.3 06/26/2024   ALKPHOS 73 06/26/2024   AST 20 06/26/2024   ALT 10 06/26/2024   ANIONGAP 12 07/23/2024    Assessment and Plan: * Ulcerative colitis (HCC) Worsening generalized abdominal pain, decreased po intake and bloody streaked BMs over multiple days in setting of baseline UC  CT A&P grossly stable today  Checking inflammatory markers to correlate  Pt reports similar episodes requiring overnight course of IVF and IV steroids  Will place on short course of IV solumedrol  Cont IVF  Pain control  Anti emetics  Follow closely    Anxiety Cont prozac  and prn atarax  as po status tolerates    HTN (hypertension), benign BP stable  Titrate home regimen    Duodenal mass Noted duodenal mass  on CT scan today Given recurrent abdominal pain, will  check MRI of the abdomen to better assess Otherwise monitor   GERD (gastroesophageal reflux disease) PPI        Advance Care Planning:   Code Status: Prior   Consults: None   Family Communication: No family at the bedside  Severity of Illness: The appropriate patient status for this patient is OBSERVATION. Observation status is judged to be reasonable and necessary in order to provide the required intensity of service to ensure the patient's safety. The patient's presenting symptoms, physical exam findings, and initial radiographic and laboratory data in the context of their medical condition is felt to place them at decreased risk for further clinical deterioration. Furthermore, it is anticipated that the patient will be medically stable for discharge from the hospital within 2 midnights of admission.   Author: Elspeth JINNY Masters, MD 07/23/2024 5:51 PM  For on call review [email or web address].

## 2024-07-23 NOTE — ED Notes (Signed)
 US  PIV placed.

## 2024-07-24 ENCOUNTER — Inpatient Hospital Stay (HOSPITAL_COMMUNITY)

## 2024-07-24 DIAGNOSIS — R1084 Generalized abdominal pain: Secondary | ICD-10-CM

## 2024-07-24 DIAGNOSIS — K3189 Other diseases of stomach and duodenum: Secondary | ICD-10-CM | POA: Diagnosis present

## 2024-07-24 DIAGNOSIS — K5730 Diverticulosis of large intestine without perforation or abscess without bleeding: Secondary | ICD-10-CM | POA: Diagnosis present

## 2024-07-24 DIAGNOSIS — F419 Anxiety disorder, unspecified: Secondary | ICD-10-CM | POA: Diagnosis present

## 2024-07-24 DIAGNOSIS — Z800 Family history of malignant neoplasm of digestive organs: Secondary | ICD-10-CM | POA: Diagnosis not present

## 2024-07-24 DIAGNOSIS — Z79899 Other long term (current) drug therapy: Secondary | ICD-10-CM | POA: Diagnosis not present

## 2024-07-24 DIAGNOSIS — E669 Obesity, unspecified: Secondary | ICD-10-CM | POA: Diagnosis present

## 2024-07-24 DIAGNOSIS — I10 Essential (primary) hypertension: Secondary | ICD-10-CM | POA: Diagnosis present

## 2024-07-24 DIAGNOSIS — K51911 Ulcerative colitis, unspecified with rectal bleeding: Secondary | ICD-10-CM | POA: Diagnosis present

## 2024-07-24 DIAGNOSIS — Z820 Family history of epilepsy and other diseases of the nervous system: Secondary | ICD-10-CM | POA: Diagnosis not present

## 2024-07-24 DIAGNOSIS — Z9049 Acquired absence of other specified parts of digestive tract: Secondary | ICD-10-CM | POA: Diagnosis not present

## 2024-07-24 DIAGNOSIS — K219 Gastro-esophageal reflux disease without esophagitis: Secondary | ICD-10-CM | POA: Diagnosis present

## 2024-07-24 DIAGNOSIS — Z803 Family history of malignant neoplasm of breast: Secondary | ICD-10-CM | POA: Diagnosis not present

## 2024-07-24 DIAGNOSIS — M329 Systemic lupus erythematosus, unspecified: Secondary | ICD-10-CM | POA: Diagnosis present

## 2024-07-24 DIAGNOSIS — Z6836 Body mass index (BMI) 36.0-36.9, adult: Secondary | ICD-10-CM | POA: Diagnosis not present

## 2024-07-24 DIAGNOSIS — G40909 Epilepsy, unspecified, not intractable, without status epilepticus: Secondary | ICD-10-CM | POA: Diagnosis present

## 2024-07-24 DIAGNOSIS — E785 Hyperlipidemia, unspecified: Secondary | ICD-10-CM | POA: Diagnosis present

## 2024-07-24 DIAGNOSIS — Z96642 Presence of left artificial hip joint: Secondary | ICD-10-CM | POA: Diagnosis present

## 2024-07-24 DIAGNOSIS — Z90710 Acquired absence of both cervix and uterus: Secondary | ICD-10-CM | POA: Diagnosis not present

## 2024-07-24 DIAGNOSIS — Z96653 Presence of artificial knee joint, bilateral: Secondary | ICD-10-CM | POA: Diagnosis present

## 2024-07-24 DIAGNOSIS — Z8249 Family history of ischemic heart disease and other diseases of the circulatory system: Secondary | ICD-10-CM | POA: Diagnosis not present

## 2024-07-24 DIAGNOSIS — Z7985 Long-term (current) use of injectable non-insulin antidiabetic drugs: Secondary | ICD-10-CM | POA: Diagnosis not present

## 2024-07-24 DIAGNOSIS — E119 Type 2 diabetes mellitus without complications: Secondary | ICD-10-CM | POA: Diagnosis present

## 2024-07-24 LAB — CBC
HCT: 44.5 % (ref 36.0–46.0)
Hemoglobin: 12.9 g/dL (ref 12.0–15.0)
MCH: 25.1 pg — ABNORMAL LOW (ref 26.0–34.0)
MCHC: 29 g/dL — ABNORMAL LOW (ref 30.0–36.0)
MCV: 86.7 fL (ref 80.0–100.0)
Platelets: 316 K/uL (ref 150–400)
RBC: 5.13 MIL/uL — ABNORMAL HIGH (ref 3.87–5.11)
RDW: 15.1 % (ref 11.5–15.5)
WBC: 6.1 K/uL (ref 4.0–10.5)
nRBC: 0 % (ref 0.0–0.2)

## 2024-07-24 LAB — COMPREHENSIVE METABOLIC PANEL WITH GFR
ALT: 22 U/L (ref 0–44)
AST: 18 U/L (ref 15–41)
Albumin: 3.9 g/dL (ref 3.5–5.0)
Alkaline Phosphatase: 84 U/L (ref 38–126)
Anion gap: 15 (ref 5–15)
BUN: 12 mg/dL (ref 8–23)
CO2: 17 mmol/L — ABNORMAL LOW (ref 22–32)
Calcium: 9.4 mg/dL (ref 8.9–10.3)
Chloride: 104 mmol/L (ref 98–111)
Creatinine, Ser: 0.58 mg/dL (ref 0.44–1.00)
GFR, Estimated: >60 mL/min (ref >60)
Glucose, Bld: 150 mg/dL — ABNORMAL HIGH (ref 70–99)
Potassium: 4.6 mmol/L (ref 3.5–5.1)
Sodium: 136 mmol/L (ref 135–145)
Total Bilirubin: 0.3 mg/dL (ref 0.0–1.2)
Total Protein: 7.3 g/dL (ref 6.5–8.1)

## 2024-07-24 LAB — HEPATIC FUNCTION PANEL
ALT: 20 U/L (ref 0–44)
AST: 19 U/L (ref 15–41)
Albumin: 3.9 g/dL (ref 3.5–5.0)
Alkaline Phosphatase: 84 U/L (ref 38–126)
Bilirubin, Direct: <0.1 mg/dL (ref 0.0–0.2)
Total Bilirubin: 0.3 mg/dL (ref 0.0–1.2)
Total Protein: 7.4 g/dL (ref 6.5–8.1)

## 2024-07-24 MED ORDER — DICYCLOMINE HCL 20 MG PO TABS: 20.0000 mg | ORAL_TABLET | Freq: Three times a day (TID) | ORAL | Status: DC | PRN

## 2024-07-24 MED ORDER — MELATONIN 5 MG PO TABS
5.0000 mg | ORAL_TABLET | Freq: Every evening | ORAL | Status: DC | PRN
  Administered 2024-07-24 (×2): 5 mg via ORAL
  Filled 2024-07-24 (×2): qty 1

## 2024-07-24 MED ORDER — GADOBUTROL 1 MMOL/ML IV SOLN
10.0000 mL | Freq: Once | INTRAVENOUS | Status: AC | PRN
Start: 2024-07-24 — End: 2024-07-24
  Administered 2024-07-24: 10 mL via INTRAVENOUS

## 2024-07-24 MED ORDER — LORAZEPAM 2 MG/ML IJ SOLN
0.5000 mg | INTRAMUSCULAR | Status: DC | PRN
  Administered 2024-07-24: 0.5 mg via INTRAVENOUS
  Filled 2024-07-24: qty 1

## 2024-07-24 NOTE — Plan of Care (Signed)
  Problem: Education: Goal: Knowledge of General Education information will improve Description: Including pain rating scale, medication(s)/side effects and non-pharmacologic comfort measures Outcome: Progressing   Problem: Activity: Goal: Risk for activity intolerance will decrease Outcome: Progressing   Problem: Nutrition: Goal: Adequate nutrition will be maintained Outcome: Progressing   Problem: Coping: Goal: Level of anxiety will decrease Outcome: Progressing   Problem: Elimination: Goal: Will not experience complications related to bowel motility Outcome: Progressing Goal: Will not experience complications related to urinary retention Outcome: Progressing   Problem: Pain Managment: Goal: General experience of comfort will improve and/or be controlled Outcome: Progressing   Problem: Safety: Goal: Ability to remain free from injury will improve Outcome: Progressing

## 2024-07-24 NOTE — Plan of Care (Signed)

## 2024-07-24 NOTE — Consult Note (Signed)
 [facility] Gastroenterology Consult  Referring Provider: No ref. provider found Primary Care Physician:  Jerome Heron Ruth, PA-C Primary Gastroenterologist: [name] (dismissed from [facility] given no-show/cancellation)  Reason for Consultation: concern for ulcerative colitis flare, ampullary lesion  SUBJECTIVE:   HPI: Erin Wolf is a 68 y.o. female seen in consultation, known to [facility] GI service. History of ulcerative colitis, patient noted diagnosed roughly 10 years prior, per chart review colonoscopy by Dr. Lennard 05/31/2013 with no mucosal abnormalities and biopsies unremarkable. Colonoscopy 07/03/2022 for ulcerative colitis (Dr. Elicia) showed inactive ulcerative colitis, biopsies showed inactive colitis with no dysplasia, sigmoid tubular adenoma. She noted previously being on Rowasa  enemas, until roughly 6 months prior when she was unable to get the medications (she believes she may have received medications from PCP as she has not been seen by [facility] since 2023). She is not currently on medication therapy.   More recently, she has been evaluated in hospital by [facility] for diverticulitis in June 2025 and August 2025. In June 2025 she was recommended to start mesalamine .   She has also undergone workup given concern for ampullary lesion on imaging including EUS on 05/08/2020 for dilated pancreatic duct and dilated common bile duct (Dr. Burnette) showed prominent ampulla with no mass, no abnormality within the pancreas, pancreatic duct 5 mm, common bile duct 10 mm.    EGD 06/09/2019 for nausea/vomiting (Dr. Lennard) showed LA grade A esophagitis, stomach and duodenum within normal limits.  Today, she noted that she has been experiencing nausea, vomiting, abdominal pain. Her symptoms seem to be most present when she has a life stressor. She noted having roughly 4 loose bowel movements per day with some mucous, no significant blood per rectum. She denied chest pain and shortness of  breath. She is pending follow up with [facility] in early November 2025.   Past Medical History:  Diagnosis Date   Anxiety    Arthritis    Chronic abdominal pain    Chronic lower back pain    Chronic nausea    Depression    Diabetes mellitus without complication (HCC)    not on meds   Diverticulosis    Dyspnea    with exertion   Frequency of urination    GERD (gastroesophageal reflux disease)    Headache    Hematuria    Hepatitis    pt denies   Hepatitis C antibody positive in blood    per pt told by pcp   History of panic attacks    History of syncope    hx recurrent syncope --- per epic domentation non-cardiac , orthostatic hypotension, anxiety, dehydration   History of uterine fibroid    HTN (hypertension), benign    Hyperlipidemia 01/03/2012   IBS (irritable bowel syndrome)    Myalgia    Sciatic nerve disease, left 2023   Seizure disorder (HCC) followed by pcp until new neurologist appr. in jan 2019   started having them in my 20's ,  petit mal ----  per pt last seizure one 2015   SLE (systemic lupus erythematosus) [facility])    rheumatologist-- dr ziolkowska (consult 11-12-2016) having work-up done   Ulcerative colitis    followed by dr lennard at [facility]   Wears glasses    Past Surgical History:  Procedure Laterality Date   ABDOMINAL HYSTERECTOMY  1988   ARTERY BIOPSY Left 06/11/2022   Procedure: LEFT BIOPSY TEMPORAL ARTERY;  Surgeon: Stevie Herlene Righter, MD;  Location: [facility];  Service: General;  Laterality: Left;   CHOLECYSTECTOMY OPEN  1978   COLONOSCOPY N/A 05/31/2013   Procedure: COLONOSCOPY;  Surgeon: Lesta JULIANNA Fitz, MD;  Location: [facility] ENDOSCOPY;  Service: Endoscopy;  Laterality: N/A;   CYSTOSCOPY/RETROGRADE/URETEROSCOPY Bilateral 07/23/2017   Procedure: CYSTOSCOPY/RETROGRADE/URETEROSCOPY;  Surgeon: Devere Lonni Righter, MD;  Location: [facility];  Service: Urology;  Laterality: Bilateral;   detached retina on right eye surgery       ESOPHAGOGASTRODUODENOSCOPY (EGD) WITH PROPOFOL  N/A 05/11/2014   Procedure: ESOPHAGOGASTRODUODENOSCOPY (EGD) WITH PROPOFOL ;  Surgeon: Jerrell KYM Sol, MD;  Location: [facility] ENDOSCOPY;  Service: Endoscopy;  Laterality: N/A;  egd first   ESOPHAGOGASTRODUODENOSCOPY (EGD) WITH PROPOFOL  N/A 05/08/2020   Procedure: ESOPHAGOGASTRODUODENOSCOPY (EGD) WITH PROPOFOL ;  Surgeon: Burnette Fallow, MD;  Location: [facility] ENDOSCOPY;  Service: Endoscopy;  Laterality: N/A;   EUS N/A 06/21/2016   Procedure: ESOPHAGEAL ENDOSCOPIC ULTRASOUND (EUS) RADIAL;  Surgeon: Fallow Burnette, MD;  Location: [facility] ENDOSCOPY;  Service: Endoscopy;  Laterality: N/A;   FLEXIBLE SIGMOIDOSCOPY N/A 05/11/2014   Procedure: FLEXIBLE SIGMOIDOSCOPY;  Surgeon: Jerrell KYM Sol, MD;  Location: [facility] ENDOSCOPY;  Service: Endoscopy;  Laterality: N/A;   LAPAROTOMY W/ BILATERAL SALPINGOOPHORECTOMY  09-26-2003   dr johnnye at [facility]   LAPROSCOPY W/ LYSIS ADHESIONS  06-08-2003   dr leva [facility]   TOTAL HIP ARTHROPLASTY Left 12/03/2022   Procedure: TOTAL HIP ARTHROPLASTY ANTERIOR APPROACH;  Surgeon: Ernie Cough, MD;  Location: [facility];  Service: Orthopedics;  Laterality: Left;   TOTAL KNEE ARTHROPLASTY Left 12/19/2020   Procedure: TOTAL KNEE ARTHROPLASTY;  Surgeon: Ernie Cough, MD;  Location: [facility];  Service: Orthopedics;  Laterality: Left;  70 mins   TOTAL KNEE ARTHROPLASTY Right 03/27/2021   Procedure: TOTAL KNEE ARTHROPLASTY;  Surgeon: Ernie Cough, MD;  Location: [facility];  Service: Orthopedics;  Laterality: Right;  70 mins   TRANSTHORACIC ECHOCARDIOGRAM  04/29/2016   ef 60-65%,  grade 2 diastolic dysfunction/  trivial MR and TR   TUBAL LIGATION Bilateral yrs ago   UPPER ESOPHAGEAL ENDOSCOPIC ULTRASOUND (EUS) N/A 05/08/2020   Procedure: UPPER ESOPHAGEAL ENDOSCOPIC ULTRASOUND (EUS);  Surgeon: Burnette Fallow, MD;  Location: [facility];  Service: Endoscopy;  Laterality: N/A;   Prior to Admission medications   Medication Sig Start Date End Date Taking?  Authorizing Provider  atorvastatin (LIPITOR) 10 MG tablet Take 10 mg by mouth daily.   Yes [provider]  ezetimibe  (ZETIA ) 10 MG tablet Take 10 mg by mouth in the morning. 04/07/24  Yes [provider]  FLUoxetine  (PROZAC ) 20 MG capsule Take 20 mg by mouth in the morning.   Yes [provider]  hydrOXYzine  (ATARAX ) 10 MG tablet Take 1 tablet (10 mg total) by mouth 3 (three) times daily as needed for anxiety or itching. 04/01/24  Yes Cheryle Page, MD  linaclotide  (LINZESS ) 72 MCG capsule Take 72 mcg by mouth daily as needed (constipation).   Yes [provider]  Melatonin 10 MG TABS Take 10 mg by mouth at bedtime as needed (sleep).   Yes [provider]  Multiple Vitamins-Minerals (CENTRUM WOMEN) TABS Take 1 tablet by mouth daily with breakfast.   Yes [provider]  naloxone  (NARCAN ) nasal spray 4 mg/0.1 mL Place 1 spray into the nose once as needed (opioid overdose).   Yes [provider]  ondansetron  (ZOFRAN -ODT) 4 MG disintegrating tablet Take 4 mg by mouth every 8 (eight) hours as needed for nausea or vomiting (dissolve orally).   Yes [provider]  Oxycodone  HCl 20 MG  TABS Take 20 mg by mouth 4 (four) times daily.   Yes [provider]  OZEMPIC, 1 MG/DOSE, 4 MG/3ML SOPN Inject 1 mg into the skin every Monday. 07/15/24  Yes [provider]  prednisoLONE acetate (PRED FORTE) 1 % ophthalmic suspension Place 1 drop into the left eye See admin instructions. Instill 1 drop into the left eye three times a day for 5 days, then twice a day for 5 days, then once a day for 5 days, then re-visit the doctor. 07/21/24 08/05/24 Yes [provider]  promethazine  (PHENERGAN ) 12.5 MG tablet Take 12.5 mg by mouth 3 (three) times daily as needed for nausea or vomiting. 07/17/24  Yes [provider]  traMADol (ULTRAM) 50 MG tablet Take 50 mg by mouth every 6 (six) hours as needed (for pain). 07/03/24  Yes  [provider]  ondansetron  (ZOFRAN ) 4 MG tablet Take 1 tablet (4 mg total) by mouth every 6 (six) hours as needed for nausea. Patient not taking: Reported on 07/23/2024 04/01/24   Cheryle Page, MD   Current Facility-Administered Medications  Medication Dose Route Frequency Provider Last Rate Last Admin   0.9 %  sodium chloride  infusion   Intravenous Continuous Vann, Jessica U, DO 75 mL/hr at 07/24/24 0950 Rate Change at 07/24/24 0950   ezetimibe  (ZETIA ) tablet 10 mg  10 mg Oral Daily Eldonna Elspeth PARAS, MD   10 mg at 07/24/24 9057   FLUoxetine  (PROZAC ) capsule 20 mg  20 mg Oral Daily Eldonna Elspeth PARAS, MD   20 mg at 07/24/24 9057   HYDROmorphone  (DILAUDID ) injection 1 mg  1 mg Intravenous Q4H PRN Newton, Steven J, MD   1 mg at 07/24/24 0945   hydrOXYzine  (ATARAX ) tablet 10 mg  10 mg Oral TID PRN Newton, Steven J, MD   10 mg at 07/23/24 2053   LORazepam  (ATIVAN ) injection 0.5 mg  0.5 mg Intravenous Q4H PRN Vann, Jessica U, DO       melatonin tablet 5 mg  5 mg Oral QHS PRN Chavez, Abigail, NP   5 mg at 07/24/24 0116   methylPREDNISolone  sodium succinate (SOLU-MEDROL ) 125 mg/2 mL injection 60 mg  60 mg Intravenous Daily Eldonna Elspeth PARAS, MD   60 mg at 07/24/24 9057   multivitamin with minerals tablet 1 tablet  1 tablet Oral Daily Eldonna Elspeth PARAS, MD   1 tablet at 07/24/24 9057   ondansetron  (ZOFRAN ) tablet 4 mg  4 mg Oral Q6H PRN Newton, Steven J, MD       Or   ondansetron  (ZOFRAN ) injection 4 mg  4 mg Intravenous Q6H PRN Newton, Steven J, MD   4 mg at 07/24/24 0945   rosuvastatin  (CRESTOR ) tablet 10 mg  10 mg Oral QHS Eldonna Elspeth PARAS, MD       Allergies as of 07/23/2024 - Review Complete 07/23/2024  Allergen Reaction Noted   Celecoxib Nausea Only 05/07/2011   Gabapentin Other (See Comments) 11/19/2023   Latex Other (See Comments) 06/20/2016   Nsaids Other (See Comments) 06/29/2016   Penicillins Hives and Other (See Comments) 05/07/2011   Wellbutrin [bupropion hcl] Other (See  Comments) 05/07/2011   Aspirin  Nausea Only and Other (See Comments) 05/07/2011   Darvocet [propoxyphene n-acetaminophen ] Nausea And Vomiting 05/07/2011   Ketorolac  tromethamine  Nausea And Vomiting 03/19/2016   Tape Hives 11/09/2013   Tylenol  [acetaminophen ] Nausea Only 10/22/2014   Butalbital-apap-caffeine Other (See Comments) 01/24/2021   Ciprofloxacin  Other (See Comments) 01/24/2021   Eszopiclone Swelling and Other (See Comments)  01/24/2021   Other Diarrhea and Other (See Comments) 08/01/2017   Topiramate Other (See Comments) 01/24/2021   Wound dressing adhesive Hives 11/09/2013   Family History  Problem Relation Age of Onset   Alzheimer's disease Mother    Hypertension Mother    Other Father        ruptured appendix   Breast cancer Sister    Hypertension Sister    Hypertension Sister    Hypertension Sister    Heart disease Sister    Colon cancer Brother    Hypertension Brother    Cancer Brother    Memory loss Brother    Social History   Socioeconomic History   Marital status: Married    Spouse name: Not on file   Number of children: 1   Years of education: 2 years college   Highest education level: Not on file  Occupational History   Occupation: Retired  Tobacco Use   Smoking status: Never   Smokeless tobacco: Never  Vaping Use   Vaping status: Never Used  Substance and Sexual Activity   Alcohol  use: Never    Comment: occ   Drug use: No   Sexual activity: Not Currently  Other Topics Concern   Not on file  Social History Narrative   Lives at home with husband.   Right-handed.   No caffeine use.   Social Drivers of Corporate investment banker Strain: Not on file  Food Insecurity: Patient Declined (07/23/2024)   Hunger Vital Sign    Worried About Running Out of Food in the Last Year: Patient declined    Ran Out of Food in the Last Year: Patient declined  Transportation Needs: Patient Declined (07/23/2024)   PRAPARE - Scientist, research (physical sciences) (Medical): Patient declined    Lack of Transportation (Non-Medical): Patient declined  Physical Activity: Not on file  Stress: Not on file  Social Connections: Unknown (07/23/2024)   Social Connection and Isolation Panel    Frequency of Communication with Friends and Family: Patient declined    Frequency of Social Gatherings with Friends and Family: Patient declined    Attends Religious Services: Patient declined    Database administrator or Organizations: No    Attends Engineer, structural: Not on file    Marital Status: Married  Catering manager Violence: Not At Risk (07/23/2024)   Humiliation, Afraid, Rape, and Kick questionnaire    Fear of Current or Ex-Partner: No    Emotionally Abused: No    Physically Abused: No    Sexually Abused: No   Review of Systems:  Review of Systems  Respiratory:  Negative for shortness of breath.   Cardiovascular:  Negative for chest pain.  Gastrointestinal:  Positive for diarrhea, nausea and vomiting. Negative for blood in stool.    OBJECTIVE:   Temp:  [97.9 F (36.6 C)-98.7 F (37.1 C)] 98.7 F (37.1 C) (10/04 0222) Pulse Rate:  [74-97] 93 (10/04 0222) Resp:  [15-18] 15 (10/04 0222) BP: (134-147)/(70-92) 145/92 (10/04 0222) SpO2:  [98 %] 98 % (10/04 0222) Weight:  [99.8 kg] 99.8 kg (10/03 1512) Last BM Date : 07/23/24 (per patient report) Physical Exam Constitutional:      General: She is not in acute distress.    Appearance: She is not ill-appearing, toxic-appearing or diaphoretic.  Cardiovascular:     Rate and Rhythm: Normal rate and regular rhythm.  Pulmonary:     Effort: No respiratory distress.     Breath sounds:  Normal breath sounds.  Abdominal:     General: Bowel sounds are normal. There is no distension.     Palpations: Abdomen is soft.     Tenderness: There is no abdominal tenderness. There is no guarding.  Skin:    General: Skin is warm and dry.  Neurological:     Mental Status: She is alert.      Labs: Recent Labs    07/23/24 1145 07/24/24 0052  WBC 5.7 6.1  HGB 13.9 12.9  HCT 45.5 44.5  PLT 330 316   BMET Recent Labs    07/23/24 1145 07/24/24 0052  NA 139 136  K 3.7 4.6  CL 105 104  CO2 22 17*  GLUCOSE 80 150*  BUN 10 12  CREATININE 0.71 0.58  CALCIUM  9.7 9.4   LFT Recent Labs    07/24/24 0105  PROT 7.4  ALBUMIN 3.9  AST 19  ALT 20  ALKPHOS 84  BILITOT 0.3  BILIDIR <0.1  IBILI NOT CALCULATED   PT/INR No results for input(s): LABPROT, INR in the last 72 hours.  Diagnostic imaging: CT ABDOMEN PELVIS WO CONTRAST Result Date: 07/23/2024 CLINICAL DATA:  Acute lower abdominal pain. EXAM: CT ABDOMEN AND PELVIS WITHOUT CONTRAST TECHNIQUE: Multidetector CT imaging of the abdomen and pelvis was performed following the standard protocol without IV contrast. RADIATION DOSE REDUCTION: This exam was performed according to the departmental dose-optimization program which includes automated exposure control, adjustment of the mA and/or kV according to patient size and/or use of iterative reconstruction technique. COMPARISON:  June 26, 2024. FINDINGS: Lower chest: No acute abnormality. Hepatobiliary: No focal liver abnormality is seen. Status post cholecystectomy. Mildly dilated common bile duct is noted with minimal intrahepatic biliary dilatation. Pancreas: Unremarkable. No pancreatic ductal dilatation or surrounding inflammatory changes. Spleen: Normal in size without focal abnormality. Adrenals/Urinary Tract: Stable left adrenal myelolipoma. Kidneys are normal, without renal calculi, focal lesion, or hydronephrosis. Bladder is unremarkable. Stomach/Bowel: The stomach is unremarkable. Diverticulosis of descending and sigmoid colon is noted without definite inflammation. The appendix is not clearly visualized. There is no evidence of bowel obstruction. There is again noted solid abnormality in the region of ampulla in second portion of duodenum concerning for possible  mass as described on prior CT scan. Further evaluation with MRI is recommended. Vascular/Lymphatic: No significant vascular findings are present. No enlarged abdominal or pelvic lymph nodes. Reproductive: Status post hysterectomy. No adnexal masses. Other: No abdominal wall hernia or abnormality. No abdominopelvic ascites. Musculoskeletal: Status post left total hip arthroplasty. No acute osseous abnormality. IMPRESSION: 1. There is again noted solid abnormality in the region of ampulla in second portion of duodenum concerning for possible mass as described on prior CT scan. Further evaluation with MRI is recommended. 2. Stable left adrenal myelolipoma. 3. Diverticulosis of descending and sigmoid colon without definite inflammation. Electronically Signed   By: Lynwood Landy Raddle M.D.   On: 07/23/2024 11:37   IMPRESSION: Nausea, vomiting History ulcerative colitis, on no medical therapies History enlarged/prominent ampulla History diverticulitis (August 2025) History non-compliance  Diabetes mellitus Hypertension Hepatitis C antibody positive  PLAN: -Follow up MRCP results, if ampulla appears further enlarged, can consider EGD, though given her overall stability, this can likely be completed in outpatient setting with her primary gastroenterologist with [facility] -No signs of active ulcerative colitis on imaging, she is not on medication therapy currently, check fecal calprotectin -No sign of diverticulitis on imaging, needs updated colonoscopy, this can be arranged with her primary GI through [facility]  Medical -Tolerating oral intake, continue -Start dicyclomine  20 mg PO BID PRN for abdominal cramping/pain -[facility] will follow   LOS: 0 days   Estefana Keas, DO [facility] Gastroenterology

## 2024-07-24 NOTE — Progress Notes (Signed)
 PROGRESS NOTE    Erin Wolf  FMW:[id number] DOB: 1956-04-22 DOA: 07/23/2024 PCP: Jerome Heron Ruth, PA-C    Brief Narrative:    Erin Wolf is a 68 y.o. female with medical history significant of anxiety, GERD, ulcerative colitis, hypertension presented with abdominal pain.  Assessment and Plan: Abdominal pain -GI consulted - No signs of active ulcerative colitis on imaging, fecal Cal ordered   Anxiety Cont prozac  and prn atarax  as po status tolerates    HTN (hypertension), benign BP stable  Titrate home regimen    Duodenal mass Noted duodenal mass on CT scan today -MRCP pending - GI consulted but most likely will need to be following up outpatient   GERD (gastroesophageal reflux disease) PPI    Obesity Estimated body mass index is 36.61 kg/m as calculated from the following:   Height as of this encounter: 5' 5 (1.651 m).   Weight as of this encounter: 99.8 kg.      DVT prophylaxis: SCDs Start: 07/23/24 1757    Code Status: Full Code Family Communication:   Disposition Plan:  Level of care: Med-Surg Status is: Inpatient   Consultants:  GI  Subjective: Complaining of stomach pain, not better today than yesterday in fact, maybe a little worse  Objective: Vitals:   07/23/24 1757 07/23/24 2152 07/24/24 0222 07/24/24 1231  BP: (!) 144/85 (!) 147/70 (!) 145/92 131/81  Pulse: 82 97 93 88  Resp: 18 16 15 18   Temp: 98 F (36.7 C) 98.6 F (37 C) 98.7 F (37.1 C) 98.4 F (36.9 C)  TempSrc: Oral Oral Oral Oral  SpO2:   98%   Weight:      Height:        Intake/Output Summary (Last 24 hours) at 07/24/2024 1424 Last data filed at 07/24/2024 0242 Gross per 24 hour  Intake 1643.56 ml  Output --  Net 1643.56 ml   Filed Weights   07/23/24 1512  Weight: 99.8 kg    Examination:   General: Appearance:    Obese female in no acute distress     Lungs:      respirations unlabored  Heart:    Normal heart rate. Normal rhythm. No  murmurs, rubs, or gallops.    MS:   All extremities are intact.    Neurologic:   Awake, alert       Data Reviewed: I have personally reviewed following labs and imaging studies  CBC: Recent Labs  Lab 07/23/24 1145 07/24/24 0052  WBC 5.7 6.1  NEUTROABS 2.4  --   HGB 13.9 12.9  HCT 45.5 44.5  MCV 84.4 86.7  PLT 330 316   Basic Metabolic Panel: Recent Labs  Lab 07/23/24 1145 07/24/24 0052  NA 139 136  K 3.7 4.6  CL 105 104  CO2 22 17*  GLUCOSE 80 150*  BUN 10 12  CREATININE 0.71 0.58  CALCIUM  9.7 9.4   GFR: Estimated Creatinine Clearance: 78.7 mL/min (by C-G formula based on SCr of 0.58 mg/dL). Liver Function Tests: Recent Labs  Lab 07/24/24 0052 07/24/24 0105  AST 18 19  ALT 22 20  ALKPHOS 84 84  BILITOT 0.3 0.3  PROT 7.3 7.4  ALBUMIN 3.9 3.9   Recent Labs  Lab 07/23/24 1145  LIPASE 22   No results for input(s): AMMONIA in the last 168 hours. Coagulation Profile: No results for input(s): INR, PROTIME in the last 168 hours. Cardiac Enzymes: No results for input(s): CKTOTAL, CKMB, CKMBINDEX, TROPONINI in the last 168  hours. BNP (last 3 results) No results for input(s): PROBNP in the last 8760 hours. HbA1C: No results for input(s): HGBA1C in the last 72 hours. CBG: No results for input(s): GLUCAP in the last 168 hours. Lipid Profile: No results for input(s): CHOL, HDL, LDLCALC, TRIG, CHOLHDL, LDLDIRECT in the last 72 hours. Thyroid  Function Tests: No results for input(s): TSH, T4TOTAL, FREET4, T3FREE, THYROIDAB in the last 72 hours. Anemia Panel: No results for input(s): VITAMINB12, FOLATE, FERRITIN, TIBC, IRON, RETICCTPCT in the last 72 hours. Sepsis Labs: No results for input(s): PROCALCITON, LATICACIDVEN in the last 168 hours.  No results found for this or any previous visit (from the past 240 hours).       Radiology Studies: CT ABDOMEN PELVIS WO CONTRAST Result Date:  07/23/2024 CLINICAL DATA:  Acute lower abdominal pain. EXAM: CT ABDOMEN AND PELVIS WITHOUT CONTRAST TECHNIQUE: Multidetector CT imaging of the abdomen and pelvis was performed following the standard protocol without IV contrast. RADIATION DOSE REDUCTION: This exam was performed according to the departmental dose-optimization program which includes automated exposure control, adjustment of the mA and/or kV according to patient size and/or use of iterative reconstruction technique. COMPARISON:  June 26, 2024. FINDINGS: Lower chest: No acute abnormality. Hepatobiliary: No focal liver abnormality is seen. Status post cholecystectomy. Mildly dilated common bile duct is noted with minimal intrahepatic biliary dilatation. Pancreas: Unremarkable. No pancreatic ductal dilatation or surrounding inflammatory changes. Spleen: Normal in size without focal abnormality. Adrenals/Urinary Tract: Stable left adrenal myelolipoma. Kidneys are normal, without renal calculi, focal lesion, or hydronephrosis. Bladder is unremarkable. Stomach/Bowel: The stomach is unremarkable. Diverticulosis of descending and sigmoid colon is noted without definite inflammation. The appendix is not clearly visualized. There is no evidence of bowel obstruction. There is again noted solid abnormality in the region of ampulla in second portion of duodenum concerning for possible mass as described on prior CT scan. Further evaluation with MRI is recommended. Vascular/Lymphatic: No significant vascular findings are present. No enlarged abdominal or pelvic lymph nodes. Reproductive: Status post hysterectomy. No adnexal masses. Other: No abdominal wall hernia or abnormality. No abdominopelvic ascites. Musculoskeletal: Status post left total hip arthroplasty. No acute osseous abnormality. IMPRESSION: 1. There is again noted solid abnormality in the region of ampulla in second portion of duodenum concerning for possible mass as described on prior CT scan.  Further evaluation with MRI is recommended. 2. Stable left adrenal myelolipoma. 3. Diverticulosis of descending and sigmoid colon without definite inflammation. Electronically Signed   By: Lynwood Landy Raddle M.D.   On: 07/23/2024 11:37        Scheduled Meds:  ezetimibe   10 mg Oral Daily   FLUoxetine   20 mg Oral Daily   multivitamin with minerals  1 tablet Oral Daily   rosuvastatin   10 mg Oral QHS   Continuous Infusions:  sodium chloride  75 mL/hr at 07/24/24 0950     LOS: 0 days    Time spent: 45 minutes spent on chart review, discussion with nursing staff, consultants, updating family and interview/physical exam; more than 50% of that time was spent in counseling and/or coordination of care.    Erin RAYMOND Bowl, DO [facility] Available via Epic secure chat 7am-7pm After these hours, please refer to coverage provider listed on [email or web address] 07/24/2024, 2:24 PM

## 2024-07-25 DIAGNOSIS — R1084 Generalized abdominal pain: Secondary | ICD-10-CM | POA: Diagnosis not present

## 2024-07-25 MED ORDER — PREDNISOLONE ACETATE 1 % OP SUSP
1.0000 [drp] | Freq: Three times a day (TID) | OPHTHALMIC | Status: DC
  Administered 2024-07-25: 1 [drp] via OPHTHALMIC
  Filled 2024-07-25: qty 5

## 2024-07-25 MED ORDER — PREDNISOLONE ACETATE 1 % OP SUSP
1.0000 [drp] | Freq: Every day | OPHTHALMIC | Status: DC
  Filled 2024-07-25: qty 5

## 2024-07-25 MED ORDER — PREDNISOLONE ACETATE 1 % OP SUSP
1.0000 [drp] | Freq: Two times a day (BID) | OPHTHALMIC | Status: DC
  Filled 2024-07-25: qty 5

## 2024-07-25 MED ORDER — PREDNISOLONE ACETATE 1 % OP SUSP: 1.0000 [drp] | OPHTHALMIC | Status: DC

## 2024-07-25 MED ORDER — DICYCLOMINE HCL 20 MG PO TABS: 20.0000 mg | ORAL_TABLET | Freq: Three times a day (TID) | ORAL | 0 refills | Status: DC | PRN

## 2024-07-25 NOTE — TOC Initial Note (Signed)
 Transition of Care [facility]) - Initial/Assessment Note    Patient Details  Name: Erin Wolf MRN: [id number] Date of Birth: March 29, 1956  [facility] CM/SW Contact:    Sonda Manuella Quill, RN Phone Number: 07/25/2024, 11:35 AM  Clinical Narrative:                 Spoke w/ pt in room; pt said she lives at home w/ her husband Erin Wolf [phone]); she plans to return at d/c; he will provide transportation; pt verified insurance/PCP; she denied SDOH risks; pt has cane and walker; she does not have HH services or home oxygen; no IP CM needs.  Expected Discharge Plan: Home/Self Care Barriers to Discharge: Continued Medical Work up   Patient Goals and CMS Choice Patient states their goals for this hospitalization and ongoing recovery are:: home CMS Medicare.gov Compare Post Acute Care list provided to:: Patient   [place] ownership interest in [facility].provided to:: Patient    Expected Discharge Plan and Services   Discharge Planning Services: CM Consult Post Acute Care Choice: NA Living arrangements for the past 2 months: Single Family Home Expected Discharge Date: 07/25/24               DME Arranged: N/A DME Agency: NA       HH Arranged: NA HH Agency: NA        Prior Living Arrangements/Services Living arrangements for the past 2 months: Single Family Home Lives with:: Spouse Patient language and need for interpreter reviewed:: Yes Do you feel safe going back to the place where you live?: Yes          Current home services: DME (cane, walker) Criminal Activity/Legal Involvement Pertinent to Current Situation/Hospitalization: No - Comment as needed  Activities of Daily Living   ADL Screening (condition at time of admission) Independently performs ADLs?: Yes (appropriate for developmental age) Is the patient deaf or have difficulty hearing?: No Does the patient have difficulty seeing, even when wearing glasses/contacts?: No Does  the patient have difficulty concentrating, remembering, or making decisions?: No  Permission Sought/Granted Permission sought to share information with : Case Manager Permission granted to share information with : Yes, Verbal Permission Granted  Share Information with NAME: Case Manager     Permission granted to share info w Relationship: Aryianna Earwood (spouse) [phone]     Emotional Assessment Appearance:: Appears stated age Attitude/Demeanor/Rapport: Gracious Affect (typically observed): Accepting Orientation: : Oriented to Self, Oriented to Place, Oriented to  Time Alcohol  / Substance Use: Not Applicable Psych Involvement: No (comment)  Admission diagnosis:  Generalized abdominal pain [R10.84] Ulcerative colitis (HCC) [K51.90] Patient Active Problem List   Diagnosis Date Noted   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   Colitis 05/23/2024   Class II obesity 04/28/2024   Anxiety 04/27/2024   Diverticulitis 03/31/2024   Acute diverticulitis 03/30/2024   Lower GI bleed 03/30/2024   DM (diabetes mellitus), type 2 (HCC) 03/30/2024   Abdominal pain, acute, left lower quadrant 11/19/2023   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   Abnormal laboratory test result 04/16/2022   Chronic migraine w/o aura w/o status migrainosus, not intractable 04/15/2022   Chronic insomnia 04/15/2022   S/P total knee arthroplasty, right 03/27/2021   Osteoarthritis of left knee 12/19/2020   Status post total left knee replacement 12/19/2020   Chronic abdominal pain 05/05/2020   Chronic pain of left knee    Chronic migraine 11/10/2018   Nausea & vomiting 10/18/2016   History of ulcerative  colitis    LLQ abdominal pain 10/17/2016   Syncope and collapse 06/29/2016   Left-sided weakness 06/29/2016   Facial twitching 06/29/2016   IBS (irritable bowel syndrome) 06/29/2016   Faintness    Dehydration 05/09/2014   GERD (gastroesophageal reflux disease) 05/09/2014   Gastroenteritis 05/09/2014   Chest pain,  atypical 05/09/2014   Chronic ulcerative colitis (HCC) 05/09/2014   Acute gastroenteritis 05/09/2014   Ulcerative colitis (HCC) 05/27/2013   Hypokalemia 05/27/2013   Anemia 01/04/2012   Transaminitis 01/04/2012   Diarrhea 01/04/2012   Chest pain 01/03/2012   SOB (shortness of breath) 01/03/2012   HTN (hypertension), benign 01/03/2012   Hx of migraines 01/03/2012   Seizure (HCC) 01/03/2012   Depression 01/03/2012   Ulcerative colitis (HCC) 01/03/2012   Chronic back pain 01/03/2012   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   Hyperlipidemia 01/03/2012   Hepatitis C 01/03/2012   Fibroids 01/03/2012   PCP:  Jerome Heron Ruth, PA-C Pharmacy:   [id number] [address] [place], [place] - [address] AT [facility] OF [facility] & [address] [place] [address] Phone: [phone] Fax: [phone]     Social Drivers of Health (SDOH) Social History: SDOH Screenings   Food Insecurity: No Food Insecurity (07/25/2024)  Housing: Low Risk  (07/25/2024)  Transportation Needs: No Transportation Needs (07/25/2024)  Utilities: Not At Risk (07/25/2024)  Social Connections: Unknown (07/23/2024)  Tobacco Use: Low Risk  (07/23/2024)   SDOH Interventions: Food Insecurity Interventions: Intervention Not Indicated, Community Resources Provided Housing Interventions: Intervention Not Indicated, Inpatient TOC Transportation Interventions: Intervention Not Indicated, Inpatient TOC Utilities Interventions: Intervention Not Indicated, Inpatient TOC   Readmission Risk Interventions    04/28/2024    3:21 PM 04/01/2024   10:33 AM  Readmission Risk Prevention Plan  Transportation Screening Complete Complete  PCP or Specialist Appt within 5-7 Days Complete Complete  Home Care Screening Complete Complete  Medication Review (RN CM) Complete Complete

## 2024-07-25 NOTE — Plan of Care (Signed)

## 2024-07-25 NOTE — Discharge Summary (Signed)
 Physician Discharge Summary  Erin Wolf FMW:[id number] DOB: 04/12/1956 DOA: 07/23/2024  PCP: Jerome Heron Ruth, PA-C  Admit date: 07/23/2024 Discharge date: 07/25/2024  Admitted From:  Discharge disposition: home  Recommendations for Outpatient Follow-Up:   Close outpatient follow-up with GI Trial of Bentyl  for abdominal pain Holding Ozempic until GI follow-up   Discharge Diagnosis:   Principal Problem:   Ulcerative colitis (HCC) Active Problems:   HTN (hypertension), benign   Anxiety   GERD (gastroesophageal reflux disease)   Duodenal mass    Discharge Condition: Improved.  Diet recommendation: Soft  Wound care: None.  Code status: Full.   History of Present Illness:    Erin Wolf is a 68 y.o. female with medical history significant of anxiety, GERD, ulcerative colitis, hypertension presented with ulcerative colitis flare.  Patient reports generalized abdominal pain dyspnea recurring over several days to weeks.  Noted to have been admitted July 2025 for similar issues with noted diagnosis of diverticulitis.  Received course of IV antibiotics with symptomatic improvement.  Per the patient, she is followed by [facility]   Patient states she is in discussions for outpatient partial colectomy for ulcerative colitis.  Patient reports worsening generalized pain, vomiting, bloody stools over roughly 1 week.  Patient states she usually requires roughly  1 to 2 days of IV steroids and IV fluids to symptomatically improved.  Does not appear to be on active IBD regimen. Presented to the ER afebrile, hemodynamically stable.  Satting well on room air.  White count 5.7, hemoglobin 13.9, platelets 330, creatinine 0.7.  CT abdomen pelvis noted solid abnormality in region of ampulla and second portion duodenum concerning for possible mass which is noted on prior imaging.  Diverticulosis without diverticulitis.   Hospital Course by Problem:   Abdominal pain -  Resolved -MRCP results reviewed, may benefit from outpatient EGD with her primary GI to evaluate ampulla -Fecal calprotectin results submitted and pending, no current ulcerative colitis therapies -Would benefit from outpatient colonoscopy to follow up diverticulitis and history of ulcerative colitis -Tolerating oral intake, can trial dicyclomine  for abdominal cramping (ordered yesterday    Anxiety Cont prozac  and prn atarax  as po status tolerates      HTN (hypertension), benign Stable-continue home meds     Duodenal mass Noted duodenal mass on CT scan today MRI done with some motion degradation so will need close follow-up outpatient with repeat MRI with primary GI     GERD (gastroesophageal reflux disease) PPI       Medical Consultants:   GI   Discharge Exam:   Vitals:   07/24/24 1931 07/25/24 0534  BP: 124/64 133/88  Pulse: 98 93  Resp: 18 18  Temp: 97.7 F (36.5 C) 98.4 F (36.9 C)  SpO2: 98%    Vitals:   07/24/24 0222 07/24/24 1231 07/24/24 1931 07/25/24 0534  BP: (!) 145/92 131/81 124/64 133/88  Pulse: 93 88 98 93  Resp: 15 18 18 18   Temp: 98.7 F (37.1 C) 98.4 F (36.9 C) 97.7 F (36.5 C) 98.4 F (36.9 C)  TempSrc: Oral Oral Oral Oral  SpO2: 98%  98%   Weight:      Height:        General exam: Appears calm and comfortable.    The results of significant diagnostics from this hospitalization (including imaging, microbiology, ancillary and laboratory) are listed below for reference.     Procedures and Diagnostic Studies:   MR 3D Recon At Scanner Result  Date: 07/25/2024 CLINICAL DATA:  Ampullary mass on prior imaging. EXAM: MRI ABDOMEN WITHOUT AND WITH CONTRAST (INCLUDING MRCP) TECHNIQUE: Multiplanar multisequence MR imaging of the abdomen was performed both before and after the administration of intravenous contrast. Heavily T2-weighted images of the biliary and pancreatic ducts were obtained, and three-dimensional MRCP images were rendered by  post processing. CONTRAST:  10mL GADAVIST  GADOBUTROL  1 MMOL/ML IV SOLN COMPARISON:  CT scan 07/23/2024 and 06/26/2024 FINDINGS: Lower chest: No acute findings Hepatobiliary: No suspicious focal abnormality within the liver parenchyma. No focal restricted diffusion in the liver parenchyma. Motion obscured focus of arterial phase hyperenhancement identified in the inferior right liver (see axial image 69 of series 15) measuring approximately 2.1 x 1.8 cm. No lesion visible at this location on precontrast imaging and no abnormality is seen in this region of the liver on the recent contrast infused CT of 06/26/2024. Gallbladder surgically absent. Prominent intrahepatic biliary duct dilatation is associated with dilatation of the common duct up to 15 mm diameter in the porta hepatis and more modest dilatation of the common bile duct which measures up to 7 mm diameter in the head of the pancreas. MRCP imaging demonstrates no definite choledocholithiasis. Pancreas: Diffuse dilatation of the main pancreatic duct is similar to prior CT studies and measures up to 9 mm diameter in the head of the pancreas. No mass lesion is identified within the pancreatic parenchyma. Imaging of the ampullary region is motion degraded on postcontrast imaging in the duodenum is decompressed, hindering assessment of the ampulla, but as on prior CT imaging there does appear to be some soft tissue fullness in this region. Spleen:  No splenomegaly. No suspicious focal mass lesion. Adrenals/Urinary Tract: Right adrenal gland unremarkable. 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 Kidneys unremarkable. Stomach/Bowel: Stomach is unremarkable. No gastric wall thickening. No evidence of outlet obstruction. As noted above, postcontrast imaging through the region of the duodenum is nondiagnostic due to motion artifact. Axial T2 haste imaging suggests there may be some soft tissue thickening in the region of the ampulla. No small  bowel or colonic dilatation within the visualized abdomen. Vascular/Lymphatic: No abdominal aortic aneurysm. No abdominal lymphadenopathy. Other:  No intraperitoneal free fluid. Musculoskeletal: No focal suspicious marrow enhancement within the visualized bony anatomy. IMPRESSION: 1. Motion degraded study. 2. Diffuse dilatation of the main pancreatic duct is similar to prior CT studies and measures up to 9 mm diameter in the head of the pancreas. No mass lesion is identified within the pancreatic parenchyma. Imaging of the ampullary region is motion degraded on postcontrast imaging and the duodenum is decompressed, hindering assessment of the ampulla, but as on prior CT imaging there does appear to be some soft tissue fullness in this region. Upper endoscopy recommended for definitive characterization. 3. Prominent intrahepatic biliary duct dilatation is associated with dilatation of the common duct up to 15 mm diameter in the porta hepatis and more modest dilatation of the common bile duct which measures up to 7 mm diameter in the head of the pancreas. No definite choledocholithiasis. Again, soft tissue fullness noted in the region of the ampulla. 4. Motion obscured focus of arterial phase hyperenhancement in the inferior right liver measuring approximately 2.1 x 1.8 cm. No lesion visible at this location on precontrast imaging and no abnormality is seen in this region of the liver on the recent contrast infused CT of 06/26/2024. This is most likely related to benign etiology such is vascular malformation/vascular shunting. Consider follow-up MRI  in 3-6 months to ensure stability. Electronically Signed   By: Camellia Candle M.D.   On: 07/25/2024 05:45   MR ABDOMEN MRCP W WO CONTAST Result Date: 07/25/2024 CLINICAL DATA:  Ampullary mass on prior imaging. EXAM: MRI ABDOMEN WITHOUT AND WITH CONTRAST (INCLUDING MRCP) TECHNIQUE: Multiplanar multisequence MR imaging of the abdomen was performed both before and after the  administration of intravenous contrast. Heavily T2-weighted images of the biliary and pancreatic ducts were obtained, and three-dimensional MRCP images were rendered by post processing. CONTRAST:  10mL GADAVIST  GADOBUTROL  1 MMOL/ML IV SOLN COMPARISON:  CT scan 07/23/2024 and 06/26/2024 FINDINGS: Lower chest: No acute findings Hepatobiliary: No suspicious focal abnormality within the liver parenchyma. No focal restricted diffusion in the liver parenchyma. Motion obscured focus of arterial phase hyperenhancement identified in the inferior right liver (see axial image 69 of series 15) measuring approximately 2.1 x 1.8 cm. No lesion visible at this location on precontrast imaging and no abnormality is seen in this region of the liver on the recent contrast infused CT of 06/26/2024. Gallbladder surgically absent. Prominent intrahepatic biliary duct dilatation is associated with dilatation of the common duct up to 15 mm diameter in the porta hepatis and more modest dilatation of the common bile duct which measures up to 7 mm diameter in the head of the pancreas. MRCP imaging demonstrates no definite choledocholithiasis. Pancreas: Diffuse dilatation of the main pancreatic duct is similar to prior CT studies and measures up to 9 mm diameter in the head of the pancreas. No mass lesion is identified within the pancreatic parenchyma. Imaging of the ampullary region is motion degraded on postcontrast imaging in the duodenum is decompressed, hindering assessment of the ampulla, but as on prior CT imaging there does appear to be some soft tissue fullness in this region. Spleen:  No splenomegaly. No suspicious focal mass lesion. Adrenals/Urinary Tract: Right adrenal gland unremarkable. 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 Kidneys unremarkable. Stomach/Bowel: Stomach is unremarkable. No gastric wall thickening. No evidence of outlet obstruction. As noted above, postcontrast imaging through the region of  the duodenum is nondiagnostic due to motion artifact. Axial T2 haste imaging suggests there may be some soft tissue thickening in the region of the ampulla. No small bowel or colonic dilatation within the visualized abdomen. Vascular/Lymphatic: No abdominal aortic aneurysm. No abdominal lymphadenopathy. Other:  No intraperitoneal free fluid. Musculoskeletal: No focal suspicious marrow enhancement within the visualized bony anatomy. IMPRESSION: 1. Motion degraded study. 2. Diffuse dilatation of the main pancreatic duct is similar to prior CT studies and measures up to 9 mm diameter in the head of the pancreas. No mass lesion is identified within the pancreatic parenchyma. Imaging of the ampullary region is motion degraded on postcontrast imaging and the duodenum is decompressed, hindering assessment of the ampulla, but as on prior CT imaging there does appear to be some soft tissue fullness in this region. Upper endoscopy recommended for definitive characterization. 3. Prominent intrahepatic biliary duct dilatation is associated with dilatation of the common duct up to 15 mm diameter in the porta hepatis and more modest dilatation of the common bile duct which measures up to 7 mm diameter in the head of the pancreas. No definite choledocholithiasis. Again, soft tissue fullness noted in the region of the ampulla. 4. Motion obscured focus of arterial phase hyperenhancement in the inferior right liver measuring approximately 2.1 x 1.8 cm. No lesion visible at this location on precontrast imaging and no abnormality is seen in  this region of the liver on the recent contrast infused CT of 06/26/2024. This is most likely related to benign etiology such is vascular malformation/vascular shunting. Consider follow-up MRI in 3-6 months to ensure stability. Electronically Signed   By: Camellia Candle M.D.   On: 07/25/2024 05:45   CT ABDOMEN PELVIS WO CONTRAST Result Date: 07/23/2024 CLINICAL DATA:  Acute lower abdominal pain.  EXAM: CT ABDOMEN AND PELVIS WITHOUT CONTRAST TECHNIQUE: Multidetector CT imaging of the abdomen and pelvis was performed following the standard protocol without IV contrast. RADIATION DOSE REDUCTION: This exam was performed according to the departmental dose-optimization program which includes automated exposure control, adjustment of the mA and/or kV according to patient size and/or use of iterative reconstruction technique. COMPARISON:  June 26, 2024. FINDINGS: Lower chest: No acute abnormality. Hepatobiliary: No focal liver abnormality is seen. Status post cholecystectomy. Mildly dilated common bile duct is noted with minimal intrahepatic biliary dilatation. Pancreas: Unremarkable. No pancreatic ductal dilatation or surrounding inflammatory changes. Spleen: Normal in size without focal abnormality. Adrenals/Urinary Tract: Stable left adrenal myelolipoma. Kidneys are normal, without renal calculi, focal lesion, or hydronephrosis. Bladder is unremarkable. Stomach/Bowel: The stomach is unremarkable. Diverticulosis of descending and sigmoid colon is noted without definite inflammation. The appendix is not clearly visualized. There is no evidence of bowel obstruction. There is again noted solid abnormality in the region of ampulla in second portion of duodenum concerning for possible mass as described on prior CT scan. Further evaluation with MRI is recommended. Vascular/Lymphatic: No significant vascular findings are present. No enlarged abdominal or pelvic lymph nodes. Reproductive: Status post hysterectomy. No adnexal masses. Other: No abdominal wall hernia or abnormality. No abdominopelvic ascites. Musculoskeletal: Status post left total hip arthroplasty. No acute osseous abnormality. IMPRESSION: 1. There is again noted solid abnormality in the region of ampulla in second portion of duodenum concerning for possible mass as described on prior CT scan. Further evaluation with MRI is recommended. 2. Stable left  adrenal myelolipoma. 3. Diverticulosis of descending and sigmoid colon without definite inflammation. Electronically Signed   By: Lynwood Landy Raddle M.D.   On: 07/23/2024 11:37     Labs:   Basic Metabolic Panel: Recent Labs  Lab 07/23/24 1145 07/24/24 0052  NA 139 136  K 3.7 4.6  CL 105 104  CO2 22 17*  GLUCOSE 80 150*  BUN 10 12  CREATININE 0.71 0.58  CALCIUM  9.7 9.4   GFR Estimated Creatinine Clearance: 78.7 mL/min (by C-G formula based on SCr of 0.58 mg/dL). Liver Function Tests: Recent Labs  Lab 07/24/24 0052 07/24/24 0105  AST 18 19  ALT 22 20  ALKPHOS 84 84  BILITOT 0.3 0.3  PROT 7.3 7.4  ALBUMIN 3.9 3.9   Recent Labs  Lab 07/23/24 1145  LIPASE 22   No results for input(s): AMMONIA in the last 168 hours. Coagulation profile No results for input(s): INR, PROTIME in the last 168 hours.  CBC: Recent Labs  Lab 07/23/24 1145 07/24/24 0052  WBC 5.7 6.1  NEUTROABS 2.4  --   HGB 13.9 12.9  HCT 45.5 44.5  MCV 84.4 86.7  PLT 330 316   Cardiac Enzymes: No results for input(s): CKTOTAL, CKMB, CKMBINDEX, TROPONINI in the last 168 hours. BNP: Invalid input(s): POCBNP CBG: No results for input(s): GLUCAP in the last 168 hours. D-Dimer No results for input(s): DDIMER in the last 72 hours. Hgb A1c No results for input(s): HGBA1C in the last 72 hours. Lipid Profile No results for input(s): CHOL, HDL,  LDLCALC, TRIG, CHOLHDL, LDLDIRECT in the last 72 hours. Thyroid  function studies No results for input(s): TSH, T4TOTAL, T3FREE, THYROIDAB in the last 72 hours.  Invalid input(s): FREET3 Anemia work up No results for input(s): VITAMINB12, FOLATE, FERRITIN, TIBC, IRON, RETICCTPCT in the last 72 hours. Microbiology No results found for this or any previous visit (from the past 240 hours).   Discharge Instructions:   Discharge Instructions     Discharge instructions   Complete by: As directed    Soft  diet Close follow up with GI at [facility]   Increase activity slowly   Complete by: As directed       Allergies as of 07/25/2024       Reactions   Celecoxib Nausea Only   Gabapentin Other (See Comments)   Abdominal pain   Latex Other (See Comments)   Skin peels    Nsaids Other (See Comments)   HAS COLITIS FLARES   Penicillins Hives, Other (See Comments)   Tolerates cefazolin , ceftriaxone , cephalexin , AND Augmentin    Wellbutrin [bupropion Hcl] Other (See Comments)   Insomnia and headaches   Aspirin  Nausea Only, Other (See Comments)   GI Intolerance   Darvocet [propoxyphene N-acetaminophen ] Nausea And Vomiting   Ketorolac  Tromethamine  Nausea And Vomiting   Tape Hives   Tylenol  [acetaminophen ] Nausea Only   Butalbital-apap-caffeine Other (See Comments)   Stomach upset   Ciprofloxacin  Other (See Comments)   Stomach upset   Eszopiclone Swelling, Other (See Comments)   Exact site of swelling not recalled   Other Diarrhea, Other (See Comments)   Spicy foods cause diarrhea due to colitis   Topiramate Other (See Comments)   Stomach upset   Wound Dressing Adhesive Hives        Medication List     PAUSE taking these medications    Ozempic (1 MG/DOSE) 4 MG/3ML Sopn Wait to take this until your doctor or other care provider tells you to start again. Generic drug: Semaglutide (1 MG/DOSE) Inject 1 mg into the skin every Monday.       STOP taking these medications    ondansetron  4 MG tablet Commonly known as: ZOFRAN        TAKE these medications    atorvastatin 10 MG tablet Commonly known as: LIPITOR Take 10 mg by mouth daily.   Centrum Women Tabs Take 1 tablet by mouth daily with breakfast.   dicyclomine  20 MG tablet Commonly known as: BENTYL  Take 1 tablet (20 mg total) by mouth 3 (three) times daily as needed (abdominal pain).   ezetimibe  10 MG tablet Commonly known as: ZETIA  Take 10 mg by mouth in the morning.   FLUoxetine  20 MG capsule Commonly  known as: PROZAC  Take 20 mg by mouth in the morning.   hydrOXYzine  10 MG tablet Commonly known as: ATARAX  Take 1 tablet (10 mg total) by mouth 3 (three) times daily as needed for anxiety or itching.   Linzess  72 MCG capsule Generic drug: linaclotide  Take 72 mcg by mouth daily as needed (constipation).   Melatonin 10 MG Tabs Take 10 mg by mouth at bedtime as needed (sleep).   Narcan  4 MG/0.1ML Liqd nasal spray kit Generic drug: naloxone  Place 1 spray into the nose once as needed (opioid overdose).   ondansetron  4 MG disintegrating tablet Commonly known as: ZOFRAN -ODT Take 4 mg by mouth every 8 (eight) hours as needed for nausea or vomiting (dissolve orally).   Oxycodone  HCl 20 MG Tabs Take 20 mg by mouth 4 (four) times daily.  prednisoLONE acetate 1 % ophthalmic suspension Commonly known as: PRED FORTE Place 1 drop into the left eye See admin instructions. Instill 1 drop into the left eye three times a day for 5 days, then twice a day for 5 days, then once a day for 5 days, then re-visit the doctor.   promethazine  12.5 MG tablet Commonly known as: PHENERGAN  Take 12.5 mg by mouth 3 (three) times daily as needed for nausea or vomiting.   traMADol 50 MG tablet Commonly known as: ULTRAM Take 50 mg by mouth every 6 (six) hours as needed (for pain).          Time coordinating discharge: 45 minutes  Signed:  Harlene RAYMOND Bowl DO  [facility] 07/25/2024, 10:33 AM

## 2024-07-25 NOTE — Progress Notes (Signed)
 [facility] Progress Note  SUBJECTIVE:   Interval history: Erin Wolf was seen and evaluated today at bedside. Resting comfortably in bed. Noted that she had a good nights sleep, her abdominal pain is not as severe. She has some abdominal cramping with eating, though she is otherwise tolerating oral intake. She continues to have loose stools. She feels comfortable to be discharged today. She has plans to follow up with her primary GI physician through [facility] this week.  Past Medical History:  Diagnosis Date   Anxiety    Arthritis    Chronic abdominal pain    Chronic lower back pain    Chronic nausea    Depression    Diabetes mellitus without complication (HCC)    not on meds   Diverticulosis    Dyspnea    with exertion   Frequency of urination    GERD (gastroesophageal reflux disease)    Headache    Hematuria    Hepatitis    pt denies   Hepatitis C antibody positive in blood    per pt told by pcp   History of panic attacks    History of syncope    hx recurrent syncope --- per epic domentation non-cardiac , orthostatic hypotension, anxiety, dehydration   History of uterine fibroid    HTN (hypertension), benign    Hyperlipidemia 01/03/2012   IBS (irritable bowel syndrome)    Myalgia    Sciatic nerve disease, left 2023   Seizure disorder (HCC) followed by pcp until new neurologist appr. in jan 2019   started having them in my 20's ,  petit mal ----  per pt last seizure one 2015   SLE (systemic lupus erythematosus) [facility])    rheumatologist-- dr ziolkowska (consult 11-12-2016) having work-up done   Ulcerative colitis    followed by dr lennard at [facility]   Wears glasses    Past Surgical History:  Procedure Laterality Date   ABDOMINAL HYSTERECTOMY  1988   ARTERY BIOPSY Left 06/11/2022   Procedure: LEFT BIOPSY TEMPORAL ARTERY;  Surgeon: Stevie Herlene Righter, MD;  Location: [facility];  Service: General;  Laterality: Left;   CHOLECYSTECTOMY OPEN  1978    COLONOSCOPY N/A 05/31/2013   Procedure: COLONOSCOPY;  Surgeon: Lesta JULIANNA lennard, MD;  Location: [facility] ENDOSCOPY;  Service: Endoscopy;  Laterality: N/A;   CYSTOSCOPY/RETROGRADE/URETEROSCOPY Bilateral 07/23/2017   Procedure: CYSTOSCOPY/RETROGRADE/URETEROSCOPY;  Surgeon: Devere Lonni Righter, MD;  Location: [facility];  Service: Urology;  Laterality: Bilateral;   detached retina on right eye surgery      ESOPHAGOGASTRODUODENOSCOPY (EGD) WITH PROPOFOL  N/A 05/11/2014   Procedure: ESOPHAGOGASTRODUODENOSCOPY (EGD) WITH PROPOFOL ;  Surgeon: Jerrell KYM Sol, MD;  Location: [facility];  Service: Endoscopy;  Laterality: N/A;  egd first   ESOPHAGOGASTRODUODENOSCOPY (EGD) WITH PROPOFOL  N/A 05/08/2020   Procedure: ESOPHAGOGASTRODUODENOSCOPY (EGD) WITH PROPOFOL ;  Surgeon: Burnette Fallow, MD;  Location: [facility];  Service: Endoscopy;  Laterality: N/A;   EUS N/A 06/21/2016   Procedure: ESOPHAGEAL ENDOSCOPIC ULTRASOUND (EUS) RADIAL;  Surgeon: Fallow Burnette, MD;  Location: [facility];  Service: Endoscopy;  Laterality: N/A;   FLEXIBLE SIGMOIDOSCOPY N/A 05/11/2014   Procedure: FLEXIBLE SIGMOIDOSCOPY;  Surgeon: Jerrell KYM Sol, MD;  Location: [facility];  Service: Endoscopy;  Laterality: N/A;   LAPAROTOMY W/ BILATERAL SALPINGOOPHORECTOMY  09-26-2003   dr johnnye at [facility]   LAPROSCOPY W/ LYSIS ADHESIONS  06-08-2003   dr leva [facility]   TOTAL HIP ARTHROPLASTY Left 12/03/2022   Procedure: TOTAL HIP ARTHROPLASTY ANTERIOR APPROACH;  Surgeon:  Ernie Cough, MD;  Location: [facility];  Service: Orthopedics;  Laterality: Left;   TOTAL KNEE ARTHROPLASTY Left 12/19/2020   Procedure: TOTAL KNEE ARTHROPLASTY;  Surgeon: Ernie Cough, MD;  Location: [facility];  Service: Orthopedics;  Laterality: Left;  70 mins   TOTAL KNEE ARTHROPLASTY Right 03/27/2021   Procedure: TOTAL KNEE ARTHROPLASTY;  Surgeon: Ernie Cough, MD;  Location: [facility];  Service: Orthopedics;  Laterality: Right;  70 mins   TRANSTHORACIC  ECHOCARDIOGRAM  04/29/2016   ef 60-65%,  grade 2 diastolic dysfunction/  trivial MR and TR   TUBAL LIGATION Bilateral yrs ago   UPPER ESOPHAGEAL ENDOSCOPIC ULTRASOUND (EUS) N/A 05/08/2020   Procedure: UPPER ESOPHAGEAL ENDOSCOPIC ULTRASOUND (EUS);  Surgeon: Burnette Fallow, MD;  Location: [facility];  Service: Endoscopy;  Laterality: N/A;   Current Facility-Administered Medications  Medication Dose Route Frequency Provider Last Rate Last Admin   dicyclomine  (BENTYL ) tablet 20 mg  20 mg Oral TID PRN Kriss Estefana DEL, DO       ezetimibe  (ZETIA ) tablet 10 mg  10 mg Oral Daily Eldonna Elspeth PARAS, MD   10 mg at 07/25/24 9049   FLUoxetine  (PROZAC ) capsule 20 mg  20 mg Oral Daily Eldonna Elspeth PARAS, MD   20 mg at 07/25/24 0950   HYDROmorphone  (DILAUDID ) injection 1 mg  1 mg Intravenous Q4H PRN Newton, Steven J, MD   1 mg at 07/25/24 9049   hydrOXYzine  (ATARAX ) tablet 10 mg  10 mg Oral TID PRN Newton, Steven J, MD   10 mg at 07/23/24 2053   LORazepam  (ATIVAN ) injection 0.5 mg  0.5 mg Intravenous Q4H PRN Vann, Jessica U, DO   0.5 mg at 07/24/24 1506   melatonin tablet 5 mg  5 mg Oral QHS PRN Chavez, Abigail, NP   5 mg at 07/24/24 2008   multivitamin with minerals tablet 1 tablet  1 tablet Oral Daily Eldonna Elspeth PARAS, MD   1 tablet at 07/25/24 0950   ondansetron  (ZOFRAN ) tablet 4 mg  4 mg Oral Q6H PRN Eldonna Elspeth PARAS, MD       Or   ondansetron  (ZOFRAN ) injection 4 mg  4 mg Intravenous Q6H PRN Newton, Steven J, MD   4 mg at 07/25/24 0951   prednisoLONE acetate (PRED FORTE) 1 % ophthalmic suspension 1 drop  1 drop Left Eye TID Utomwen, Adesuwa, RPH       Followed by   NOREEN ON 07/28/2024] prednisoLONE acetate (PRED FORTE) 1 % ophthalmic suspension 1 drop  1 drop Left Eye BID Utomwen, Adesuwa, RPH       Followed by   NOREEN ON 08/02/2024] prednisoLONE acetate (PRED FORTE) 1 % ophthalmic suspension 1 drop  1 drop Left Eye Daily Utomwen, Adesuwa, RPH       rosuvastatin  (CRESTOR ) tablet 10 mg  10 mg Oral QHS  Eldonna Elspeth PARAS, MD       Allergies as of 07/23/2024 - Review Complete 07/23/2024  Allergen Reaction Noted   Celecoxib Nausea Only 05/07/2011   Gabapentin Other (See Comments) 11/19/2023   Latex Other (See Comments) 06/20/2016   Nsaids Other (See Comments) 06/29/2016   Penicillins Hives and Other (See Comments) 05/07/2011   Wellbutrin [bupropion hcl] Other (See Comments) 05/07/2011   Aspirin  Nausea Only and Other (See Comments) 05/07/2011   Darvocet [propoxyphene n-acetaminophen ] Nausea And Vomiting 05/07/2011   Ketorolac  tromethamine  Nausea And Vomiting 03/19/2016   Tape Hives 11/09/2013   Tylenol  [acetaminophen ] Nausea Only 10/22/2014   Butalbital-apap-caffeine Other (See Comments) 01/24/2021  Ciprofloxacin  Other (See Comments) 01/24/2021   Eszopiclone Swelling and Other (See Comments) 01/24/2021   Other Diarrhea and Other (See Comments) 08/01/2017   Topiramate Other (See Comments) 01/24/2021   Wound dressing adhesive Hives 11/09/2013   Review of Systems:  Review of Systems  Respiratory:  Negative for shortness of breath.   Cardiovascular:  Negative for chest pain.  Gastrointestinal:  Positive for abdominal pain and diarrhea.       Abdominal cramping     OBJECTIVE:   Temp:  [97.7 F (36.5 C)-98.4 F (36.9 C)] 98.4 F (36.9 C) (10/05 0534) Pulse Rate:  [88-98] 93 (10/05 0534) Resp:  [18] 18 (10/05 0534) BP: (124-133)/(64-88) 133/88 (10/05 0534) SpO2:  [98 %] 98 % (10/04 1931) Last BM Date : 07/23/24 Physical Exam Constitutional:      General: She is not in acute distress.    Appearance: She is not ill-appearing, toxic-appearing or diaphoretic.  Cardiovascular:     Rate and Rhythm: Normal rate and regular rhythm.  Pulmonary:     Effort: No respiratory distress.     Breath sounds: Normal breath sounds.  Abdominal:     General: Bowel sounds are normal. There is no distension.     Palpations: Abdomen is soft.     Tenderness: There is no abdominal tenderness. There  is no guarding.  Neurological:     Mental Status: She is alert.     Labs: Recent Labs    07/23/24 1145 07/24/24 0052  WBC 5.7 6.1  HGB 13.9 12.9  HCT 45.5 44.5  PLT 330 316   BMET Recent Labs    07/23/24 1145 07/24/24 0052  NA 139 136  K 3.7 4.6  CL 105 104  CO2 22 17*  GLUCOSE 80 150*  BUN 10 12  CREATININE 0.71 0.58  CALCIUM  9.7 9.4   LFT Recent Labs    07/24/24 0105  PROT 7.4  ALBUMIN 3.9  AST 19  ALT 20  ALKPHOS 84  BILITOT 0.3  BILIDIR <0.1  IBILI NOT CALCULATED   PT/INR No results for input(s): LABPROT, INR in the last 72 hours. Diagnostic imaging: MR 3D Recon At Scanner Result Date: 07/25/2024 CLINICAL DATA:  Ampullary mass on prior imaging. EXAM: MRI ABDOMEN WITHOUT AND WITH CONTRAST (INCLUDING MRCP) TECHNIQUE: Multiplanar multisequence MR imaging of the abdomen was performed both before and after the administration of intravenous contrast. Heavily T2-weighted images of the biliary and pancreatic ducts were obtained, and three-dimensional MRCP images were rendered by post processing. CONTRAST:  10mL GADAVIST  GADOBUTROL  1 MMOL/ML IV SOLN COMPARISON:  CT scan 07/23/2024 and 06/26/2024 FINDINGS: Lower chest: No acute findings Hepatobiliary: No suspicious focal abnormality within the liver parenchyma. No focal restricted diffusion in the liver parenchyma. Motion obscured focus of arterial phase hyperenhancement identified in the inferior right liver (see axial image 69 of series 15) measuring approximately 2.1 x 1.8 cm. No lesion visible at this location on precontrast imaging and no abnormality is seen in this region of the liver on the recent contrast infused CT of 06/26/2024. Gallbladder surgically absent. Prominent intrahepatic biliary duct dilatation is associated with dilatation of the common duct up to 15 mm diameter in the porta hepatis and more modest dilatation of the common bile duct which measures up to 7 mm diameter in the head of the pancreas. MRCP  imaging demonstrates no definite choledocholithiasis. Pancreas: Diffuse dilatation of the main pancreatic duct is similar to prior CT studies and measures up to 9 mm diameter in the head  of the pancreas. No mass lesion is identified within the pancreatic parenchyma. Imaging of the ampullary region is motion degraded on postcontrast imaging in the duodenum is decompressed, hindering assessment of the ampulla, but as on prior CT imaging there does appear to be some soft tissue fullness in this region. Spleen:  No splenomegaly. No suspicious focal mass lesion. Adrenals/Urinary Tract: Right adrenal gland unremarkable. 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 Kidneys unremarkable. Stomach/Bowel: Stomach is unremarkable. No gastric wall thickening. No evidence of outlet obstruction. As noted above, postcontrast imaging through the region of the duodenum is nondiagnostic due to motion artifact. Axial T2 haste imaging suggests there may be some soft tissue thickening in the region of the ampulla. No small bowel or colonic dilatation within the visualized abdomen. Vascular/Lymphatic: No abdominal aortic aneurysm. No abdominal lymphadenopathy. Other:  No intraperitoneal free fluid. Musculoskeletal: No focal suspicious marrow enhancement within the visualized bony anatomy. IMPRESSION: 1. Motion degraded study. 2. Diffuse dilatation of the main pancreatic duct is similar to prior CT studies and measures up to 9 mm diameter in the head of the pancreas. No mass lesion is identified within the pancreatic parenchyma. Imaging of the ampullary region is motion degraded on postcontrast imaging and the duodenum is decompressed, hindering assessment of the ampulla, but as on prior CT imaging there does appear to be some soft tissue fullness in this region. Upper endoscopy recommended for definitive characterization. 3. Prominent intrahepatic biliary duct dilatation is associated with dilatation of the common duct  up to 15 mm diameter in the porta hepatis and more modest dilatation of the common bile duct which measures up to 7 mm diameter in the head of the pancreas. No definite choledocholithiasis. Again, soft tissue fullness noted in the region of the ampulla. 4. Motion obscured focus of arterial phase hyperenhancement in the inferior right liver measuring approximately 2.1 x 1.8 cm. No lesion visible at this location on precontrast imaging and no abnormality is seen in this region of the liver on the recent contrast infused CT of 06/26/2024. This is most likely related to benign etiology such is vascular malformation/vascular shunting. Consider follow-up MRI in 3-6 months to ensure stability. Electronically Signed   By: Camellia Candle M.D.   On: 07/25/2024 05:45   MR ABDOMEN MRCP W WO CONTAST Result Date: 07/25/2024 CLINICAL DATA:  Ampullary mass on prior imaging. EXAM: MRI ABDOMEN WITHOUT AND WITH CONTRAST (INCLUDING MRCP) TECHNIQUE: Multiplanar multisequence MR imaging of the abdomen was performed both before and after the administration of intravenous contrast. Heavily T2-weighted images of the biliary and pancreatic ducts were obtained, and three-dimensional MRCP images were rendered by post processing. CONTRAST:  10mL GADAVIST  GADOBUTROL  1 MMOL/ML IV SOLN COMPARISON:  CT scan 07/23/2024 and 06/26/2024 FINDINGS: Lower chest: No acute findings Hepatobiliary: No suspicious focal abnormality within the liver parenchyma. No focal restricted diffusion in the liver parenchyma. Motion obscured focus of arterial phase hyperenhancement identified in the inferior right liver (see axial image 69 of series 15) measuring approximately 2.1 x 1.8 cm. No lesion visible at this location on precontrast imaging and no abnormality is seen in this region of the liver on the recent contrast infused CT of 06/26/2024. Gallbladder surgically absent. Prominent intrahepatic biliary duct dilatation is associated with dilatation of the common  duct up to 15 mm diameter in the porta hepatis and more modest dilatation of the common bile duct which measures up to 7 mm diameter in the head of the pancreas. MRCP  imaging demonstrates no definite choledocholithiasis. Pancreas: Diffuse dilatation of the main pancreatic duct is similar to prior CT studies and measures up to 9 mm diameter in the head of the pancreas. No mass lesion is identified within the pancreatic parenchyma. Imaging of the ampullary region is motion degraded on postcontrast imaging in the duodenum is decompressed, hindering assessment of the ampulla, but as on prior CT imaging there does appear to be some soft tissue fullness in this region. Spleen:  No splenomegaly. No suspicious focal mass lesion. Adrenals/Urinary Tract: Right adrenal gland unremarkable. 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 Kidneys unremarkable. Stomach/Bowel: Stomach is unremarkable. No gastric wall thickening. No evidence of outlet obstruction. As noted above, postcontrast imaging through the region of the duodenum is nondiagnostic due to motion artifact. Axial T2 haste imaging suggests there may be some soft tissue thickening in the region of the ampulla. No small bowel or colonic dilatation within the visualized abdomen. Vascular/Lymphatic: No abdominal aortic aneurysm. No abdominal lymphadenopathy. Other:  No intraperitoneal free fluid. Musculoskeletal: No focal suspicious marrow enhancement within the visualized bony anatomy. IMPRESSION: 1. Motion degraded study. 2. Diffuse dilatation of the main pancreatic duct is similar to prior CT studies and measures up to 9 mm diameter in the head of the pancreas. No mass lesion is identified within the pancreatic parenchyma. Imaging of the ampullary region is motion degraded on postcontrast imaging and the duodenum is decompressed, hindering assessment of the ampulla, but as on prior CT imaging there does appear to be some soft tissue fullness in this  region. Upper endoscopy recommended for definitive characterization. 3. Prominent intrahepatic biliary duct dilatation is associated with dilatation of the common duct up to 15 mm diameter in the porta hepatis and more modest dilatation of the common bile duct which measures up to 7 mm diameter in the head of the pancreas. No definite choledocholithiasis. Again, soft tissue fullness noted in the region of the ampulla. 4. Motion obscured focus of arterial phase hyperenhancement in the inferior right liver measuring approximately 2.1 x 1.8 cm. No lesion visible at this location on precontrast imaging and no abnormality is seen in this region of the liver on the recent contrast infused CT of 06/26/2024. This is most likely related to benign etiology such is vascular malformation/vascular shunting. Consider follow-up MRI in 3-6 months to ensure stability. Electronically Signed   By: Camellia Candle M.D.   On: 07/25/2024 05:45   CT ABDOMEN PELVIS WO CONTRAST Result Date: 07/23/2024 CLINICAL DATA:  Acute lower abdominal pain. EXAM: CT ABDOMEN AND PELVIS WITHOUT CONTRAST TECHNIQUE: Multidetector CT imaging of the abdomen and pelvis was performed following the standard protocol without IV contrast. RADIATION DOSE REDUCTION: This exam was performed according to the departmental dose-optimization program which includes automated exposure control, adjustment of the mA and/or kV according to patient size and/or use of iterative reconstruction technique. COMPARISON:  June 26, 2024. FINDINGS: Lower chest: No acute abnormality. Hepatobiliary: No focal liver abnormality is seen. Status post cholecystectomy. Mildly dilated common bile duct is noted with minimal intrahepatic biliary dilatation. Pancreas: Unremarkable. No pancreatic ductal dilatation or surrounding inflammatory changes. Spleen: Normal in size without focal abnormality. Adrenals/Urinary Tract: Stable left adrenal myelolipoma. Kidneys are normal, without renal  calculi, focal lesion, or hydronephrosis. Bladder is unremarkable. Stomach/Bowel: The stomach is unremarkable. Diverticulosis of descending and sigmoid colon is noted without definite inflammation. The appendix is not clearly visualized. There is no evidence of bowel obstruction. There is again noted solid  abnormality in the region of ampulla in second portion of duodenum concerning for possible mass as described on prior CT scan. Further evaluation with MRI is recommended. Vascular/Lymphatic: No significant vascular findings are present. No enlarged abdominal or pelvic lymph nodes. Reproductive: Status post hysterectomy. No adnexal masses. Other: No abdominal wall hernia or abnormality. No abdominopelvic ascites. Musculoskeletal: Status post left total hip arthroplasty. No acute osseous abnormality. IMPRESSION: 1. There is again noted solid abnormality in the region of ampulla in second portion of duodenum concerning for possible mass as described on prior CT scan. Further evaluation with MRI is recommended. 2. Stable left adrenal myelolipoma. 3. Diverticulosis of descending and sigmoid colon without definite inflammation. Electronically Signed   By: Lynwood Landy Raddle M.D.   On: 07/23/2024 11:37   IMPRESSION: Nausea, vomiting, improved, tolerating oral intake History ulcerative colitis, on no medical therapies  -Fecal calprotectin pending  -Has follow up with [facility] this coming week History enlarged/prominent ampulla  -MRCP results reviewed, stable size  -Has had workup of this in 2021 History diverticulitis (August 2025) History non-compliance  Diabetes mellitus Hypertension Hepatitis C antibody positive  PLAN: -MRCP results reviewed, may benefit from outpatient EGD with her primary GI to evaluate ampulla -Fecal calprotectin results submitted and pending, no current ulcerative colitis therapies -Would benefit from outpatient colonoscopy to follow up diverticulitis and history of ulcerative  colitis -Tolerating oral intake, can trial dicyclomine  for abdominal cramping (ordered yesterday but not yet administered) -Stable for discharge from GI perspective, follow up with her primary GI this week -[facility] will sign off and be available should questions arise   LOS: 1 day   Estefana Keas, DO [facility]

## 2024-07-27 LAB — CALPROTECTIN, FECAL: Calprotectin, Fecal: 16 ug/g (ref 0–120)

## 2024-08-14 ENCOUNTER — Emergency Department (HOSPITAL_COMMUNITY)
Admission: EM | Admit: 2024-08-14 | Discharge: 2024-08-14 | Disposition: A | Discharge status: Home / Self Care | Source: Home / Self Care | Attending: Emergency Medicine | Admitting: Emergency Medicine

## 2024-08-14 ENCOUNTER — Emergency Department (HOSPITAL_COMMUNITY)

## 2024-08-14 ENCOUNTER — Encounter (HOSPITAL_COMMUNITY): Payer: Self-pay | Admitting: Emergency Medicine

## 2024-08-14 ENCOUNTER — Other Ambulatory Visit: Payer: Self-pay

## 2024-08-14 DIAGNOSIS — K5792 Diverticulitis of intestine, part unspecified, without perforation or abscess without bleeding: Secondary | ICD-10-CM | POA: Diagnosis not present

## 2024-08-14 DIAGNOSIS — Z96653 Presence of artificial knee joint, bilateral: Secondary | ICD-10-CM | POA: Insufficient documentation

## 2024-08-14 DIAGNOSIS — Z96642 Presence of left artificial hip joint: Secondary | ICD-10-CM | POA: Insufficient documentation

## 2024-08-14 DIAGNOSIS — Z794 Long term (current) use of insulin: Secondary | ICD-10-CM | POA: Insufficient documentation

## 2024-08-14 DIAGNOSIS — K5732 Diverticulitis of large intestine without perforation or abscess without bleeding: Secondary | ICD-10-CM | POA: Insufficient documentation

## 2024-08-14 DIAGNOSIS — E119 Type 2 diabetes mellitus without complications: Secondary | ICD-10-CM | POA: Insufficient documentation

## 2024-08-14 DIAGNOSIS — I10 Essential (primary) hypertension: Secondary | ICD-10-CM | POA: Insufficient documentation

## 2024-08-14 DIAGNOSIS — Z91040 Latex allergy status: Secondary | ICD-10-CM | POA: Insufficient documentation

## 2024-08-14 DIAGNOSIS — K5733 Diverticulitis of large intestine without perforation or abscess with bleeding: Secondary | ICD-10-CM | POA: Diagnosis not present

## 2024-08-14 LAB — URINALYSIS, ROUTINE W REFLEX MICROSCOPIC
Bilirubin Urine: NEGATIVE
Glucose, UA: NEGATIVE mg/dL
Hgb urine dipstick: NEGATIVE
Ketones, ur: NEGATIVE mg/dL
Nitrite: NEGATIVE
Protein, ur: NEGATIVE mg/dL
Specific Gravity, Urine: 1.025 (ref 1.005–1.030)
pH: 5 (ref 5.0–8.0)

## 2024-08-14 LAB — COMPREHENSIVE METABOLIC PANEL WITH GFR
ALT: 11 U/L (ref 0–44)
AST: 20 U/L (ref 15–41)
Albumin: 4.1 g/dL (ref 3.5–5.0)
Alkaline Phosphatase: 78 U/L (ref 38–126)
Anion gap: 12 (ref 5–15)
BUN: 9 mg/dL (ref 8–23)
CO2: 20 mmol/L — ABNORMAL LOW (ref 22–32)
Calcium: 9.4 mg/dL (ref 8.9–10.3)
Chloride: 104 mmol/L (ref 98–111)
Creatinine, Ser: 0.74 mg/dL (ref 0.44–1.00)
GFR, Estimated: >60 mL/min (ref >60)
Glucose, Bld: 90 mg/dL (ref 70–99)
Potassium: 3.9 mmol/L (ref 3.5–5.1)
Sodium: 136 mmol/L (ref 135–145)
Total Bilirubin: 0.4 mg/dL (ref 0.0–1.2)
Total Protein: 8.2 g/dL — ABNORMAL HIGH (ref 6.5–8.1)

## 2024-08-14 LAB — CBC
HCT: 48.3 % — ABNORMAL HIGH (ref 36.0–46.0)
Hemoglobin: 14.3 g/dL (ref 12.0–15.0)
MCH: 25.2 pg — ABNORMAL LOW (ref 26.0–34.0)
MCHC: 29.6 g/dL — ABNORMAL LOW (ref 30.0–36.0)
MCV: 85.2 fL (ref 80.0–100.0)
Platelets: 301 K/uL (ref 150–400)
RBC: 5.67 MIL/uL — ABNORMAL HIGH (ref 3.87–5.11)
RDW: 15.3 % (ref 11.5–15.5)
WBC: 5.6 K/uL (ref 4.0–10.5)
nRBC: 0 % (ref 0.0–0.2)

## 2024-08-14 LAB — LIPASE, BLOOD: Lipase: 25 U/L (ref 11–51)

## 2024-08-14 MED ORDER — AMOXICILLIN-POT CLAVULANATE 875-125 MG PO TABS: 1.0000 | ORAL_TABLET | Freq: Three times a day (TID) | ORAL | 0 refills | Status: DC

## 2024-08-14 MED ORDER — HYDRALAZINE HCL 20 MG/ML IJ SOLN
10.0000 mg | Freq: Once | INTRAMUSCULAR | Status: AC
  Administered 2024-08-14: 10 mg via INTRAVENOUS
  Filled 2024-08-14: qty 1

## 2024-08-14 MED ORDER — HYDROMORPHONE HCL 1 MG/ML IJ SOLN
0.5000 mg | Freq: Once | INTRAMUSCULAR | Status: AC
Start: 2024-08-14 — End: 2024-08-14
  Administered 2024-08-14: 0.5 mg via INTRAVENOUS
  Filled 2024-08-14: qty 1

## 2024-08-14 MED ORDER — MORPHINE SULFATE (PF) 4 MG/ML IV SOLN
4.0000 mg | Freq: Once | INTRAVENOUS | Status: AC
Start: 2024-08-14 — End: 2024-08-14
  Administered 2024-08-14: 4 mg via INTRAVENOUS
  Filled 2024-08-14: qty 1

## 2024-08-14 MED ORDER — ALUM & MAG HYDROXIDE-SIMETH 200-200-20 MG/5ML PO SUSP
30.0000 mL | Freq: Once | ORAL | Status: DC
  Filled 2024-08-14: qty 30

## 2024-08-14 MED ORDER — ONDANSETRON 4 MG PO TBDP: 4.0000 mg | ORAL_TABLET | Freq: Three times a day (TID) | ORAL | 0 refills | Status: DC | PRN

## 2024-08-14 MED ORDER — DICYCLOMINE HCL 10 MG PO CAPS
10.0000 mg | ORAL_CAPSULE | Freq: Once | ORAL | Status: AC
  Administered 2024-08-14: 10 mg via ORAL
  Filled 2024-08-14: qty 1

## 2024-08-14 MED ORDER — IOHEXOL 300 MG/ML  SOLN
100.0000 mL | Freq: Once | INTRAMUSCULAR | Status: AC | PRN
  Administered 2024-08-14: 100 mL via INTRAVENOUS

## 2024-08-14 MED ORDER — ONDANSETRON HCL 4 MG/2ML IJ SOLN
4.0000 mg | Freq: Once | INTRAMUSCULAR | Status: AC
  Administered 2024-08-14: 4 mg via INTRAVENOUS
  Filled 2024-08-14: qty 2

## 2024-08-14 MED ORDER — SODIUM CHLORIDE 0.9 % IV BOLUS
1000.0000 mL | Freq: Once | INTRAVENOUS | Status: AC
  Administered 2024-08-14: 1000 mL via INTRAVENOUS

## 2024-08-14 MED ORDER — HYDROMORPHONE HCL 1 MG/ML IJ SOLN
1.0000 mg | Freq: Once | INTRAMUSCULAR | Status: AC
  Administered 2024-08-14: 1 mg via INTRAVENOUS
  Filled 2024-08-14: qty 1

## 2024-08-14 MED ORDER — LORAZEPAM 2 MG/ML IJ SOLN
1.0000 mg | Freq: Once | INTRAMUSCULAR | Status: AC
  Administered 2024-08-14: 1 mg via INTRAVENOUS
  Filled 2024-08-14: qty 1

## 2024-08-14 NOTE — Discharge Instructions (Signed)
 You were evaluated in the emergency room for abdominal pain.  You are found to have mild diverticulitis.  A prescription for Augmentin  was sent to your pharmacy.  Please be sure to complete the full course of antibiotics.  You have additionally provided refill for Zofran .  Please follow-up with your GI doctor for further evaluation.  If you experience any new or worsening symptoms please return to the emergency room.

## 2024-08-14 NOTE — ED Provider Notes (Signed)
 [place] [facility] AT [facility] Provider Note   CSN: [id number] Arrival date & time: 08/14/24  1133     Patient presents with: Abdominal Pain   Erin Wolf is a 68 y.o. female with history of recurrent diverticulitis, ulcerative colitis and anxiety presents with complaints of recurrent abdominal pain associated with nausea, vomiting and nonbloody diarrhea.  Recent flare has been ongoing for the past week.  Reports that back on Wednesday she was running to the bathroom due to the diarrhea and fell on her right leg.  Reports that she has been able to walk since.  Pain is improving.    Abdominal Pain     Past Medical History:  Diagnosis Date   Anxiety    Arthritis    Chronic abdominal pain    Chronic lower back pain    Chronic nausea    Depression    Diabetes mellitus without complication (HCC)    not on meds   Diverticulosis    Dyspnea    with exertion   Frequency of urination    GERD (gastroesophageal reflux disease)    Headache    Hematuria    Hepatitis    pt denies   Hepatitis C antibody positive in blood    per pt told by pcp   History of panic attacks    History of syncope    hx recurrent syncope --- per epic domentation non-cardiac , orthostatic hypotension, anxiety, dehydration   History of uterine fibroid    HTN (hypertension), benign    Hyperlipidemia 01/03/2012   IBS (irritable bowel syndrome)    Myalgia    Sciatic nerve disease, left 2023   Seizure disorder (HCC) followed by pcp until new neurologist appr. in jan 2019   started having them in my 20's ,  petit mal ----  per pt last seizure one 2015   SLE (systemic lupus erythematosus) [facility])    rheumatologist-- dr ziolkowska (consult 11-12-2016) having work-up done   Ulcerative colitis    followed by dr lennard at [facility]   Wears glasses    Past Surgical History:  Procedure Laterality Date   ABDOMINAL HYSTERECTOMY  1988   ARTERY BIOPSY Left 06/11/2022   Procedure: LEFT  BIOPSY TEMPORAL ARTERY;  Surgeon: Stevie Herlene Righter, MD;  Location: [facility];  Service: General;  Laterality: Left;   CHOLECYSTECTOMY OPEN  1978   COLONOSCOPY N/A 05/31/2013   Procedure: COLONOSCOPY;  Surgeon: Lesta JULIANNA Lennard, MD;  Location: [facility]COPY;  Service: Endoscopy;  Laterality: N/A;   CYSTOSCOPY/RETROGRADE/URETEROSCOPY Bilateral 07/23/2017   Procedure: CYSTOSCOPY/RETROGRADE/URETEROSCOPY;  Surgeon: Devere Lonni Righter, MD;  Location: [facility];  Service: Urology;  Laterality: Bilateral;   detached retina on right eye surgery      ESOPHAGOGASTRODUODENOSCOPY (EGD) WITH PROPOFOL  N/A 05/11/2014   Procedure: ESOPHAGOGASTRODUODENOSCOPY (EGD) WITH PROPOFOL ;  Surgeon: Jerrell KYM Sol, MD;  Location: [facility];  Service: Endoscopy;  Laterality: N/A;  egd first   ESOPHAGOGASTRODUODENOSCOPY (EGD) WITH PROPOFOL  N/A 05/08/2020   Procedure: ESOPHAGOGASTRODUODENOSCOPY (EGD) WITH PROPOFOL ;  Surgeon: Burnette Fallow, MD;  Location: [facility];  Service: Endoscopy;  Laterality: N/A;   EUS N/A 06/21/2016   Procedure: ESOPHAGEAL ENDOSCOPIC ULTRASOUND (EUS) RADIAL;  Surgeon: Fallow Burnette, MD;  Location: [facility];  Service: Endoscopy;  Laterality: N/A;   FLEXIBLE SIGMOIDOSCOPY N/A 05/11/2014   Procedure: FLEXIBLE SIGMOIDOSCOPY;  Surgeon: Jerrell KYM Sol, MD;  Location: [facility];  Service: Endoscopy;  Laterality: N/A;   LAPAROTOMY W/ BILATERAL SALPINGOOPHORECTOMY  09-26-2003  dr johnnye at [facility]   LAPROSCOPY W/ LYSIS ADHESIONS  06-08-2003   dr leva [facility]   TOTAL HIP ARTHROPLASTY Left 12/03/2022   Procedure: TOTAL HIP ARTHROPLASTY ANTERIOR APPROACH;  Surgeon: Ernie Cough, MD;  Location: [facility];  Service: Orthopedics;  Laterality: Left;   TOTAL KNEE ARTHROPLASTY Left 12/19/2020   Procedure: TOTAL KNEE ARTHROPLASTY;  Surgeon: Ernie Cough, MD;  Location: [facility];  Service: Orthopedics;  Laterality: Left;  70 mins   TOTAL KNEE ARTHROPLASTY Right 03/27/2021    Procedure: TOTAL KNEE ARTHROPLASTY;  Surgeon: Ernie Cough, MD;  Location: [facility];  Service: Orthopedics;  Laterality: Right;  70 mins   TRANSTHORACIC ECHOCARDIOGRAM  04/29/2016   ef 60-65%,  grade 2 diastolic dysfunction/  trivial MR and TR   TUBAL LIGATION Bilateral yrs ago   UPPER ESOPHAGEAL ENDOSCOPIC ULTRASOUND (EUS) N/A 05/08/2020   Procedure: UPPER ESOPHAGEAL ENDOSCOPIC ULTRASOUND (EUS);  Surgeon: Burnette Fallow, MD;  Location: [facility];  Service: Endoscopy;  Laterality: N/A;     Prior to Admission medications   Medication Sig Start Date End Date Taking? Authorizing Provider  amoxicillin -clavulanate (AUGMENTIN ) 875-125 MG tablet Take 1 tablet by mouth every 8 (eight) hours. 08/14/24  Yes Donnajean Lynwood DEL, PA-C  atorvastatin (LIPITOR) 10 MG tablet Take 10 mg by mouth daily.    [provider]  dicyclomine  (BENTYL ) 20 MG tablet Take 1 tablet (20 mg total) by mouth 3 (three) times daily as needed (abdominal pain). 07/25/24   Vann, Jessica U, DO  ezetimibe  (ZETIA ) 10 MG tablet Take 10 mg by mouth in the morning. 04/07/24   [provider]  FLUoxetine  (PROZAC ) 20 MG capsule Take 20 mg by mouth in the morning.    [provider]  hydrOXYzine  (ATARAX ) 10 MG tablet Take 1 tablet (10 mg total) by mouth 3 (three) times daily as needed for anxiety or itching. 04/01/24   Cheryle Page, MD  linaclotide  (LINZESS ) 72 MCG capsule Take 72 mcg by mouth daily as needed (constipation).    [provider]  Melatonin 10 MG TABS Take 10 mg by mouth at bedtime as needed (sleep).    [provider]  Multiple Vitamins-Minerals (CENTRUM WOMEN) TABS Take 1 tablet by mouth daily with breakfast.    [provider]  naloxone  (NARCAN ) nasal spray 4 mg/0.1 mL Place 1 spray into the nose once as needed (opioid overdose).    [provider]  ondansetron  (ZOFRAN -ODT) 4 MG disintegrating tablet Take 1 tablet (4 mg total) by mouth every 8 (eight) hours as  needed for nausea or vomiting (dissolve orally). 08/14/24   Donnajean Lynwood DEL, PA-C  Oxycodone  HCl 20 MG TABS Take 20 mg by mouth 4 (four) times daily.    [provider]  OZEMPIC, 1 MG/DOSE, 4 MG/3ML SOPN Inject 1 mg into the skin every Monday. 07/15/24   [provider]  promethazine  (PHENERGAN ) 12.5 MG tablet Take 12.5 mg by mouth 3 (three) times daily as needed for nausea or vomiting. 07/17/24   [provider]  traMADol (ULTRAM) 50 MG tablet Take 50 mg by mouth every 6 (six) hours as needed (for pain). 07/03/24   [provider]    Allergies: Celecoxib, Gabapentin, Latex, Nsaids, Penicillins, Wellbutrin [bupropion hcl], Aspirin , Darvocet [propoxyphene n-acetaminophen ], Ketorolac  tromethamine , Tape, Tylenol  [acetaminophen ], Butalbital-apap-caffeine, Ciprofloxacin , Eszopiclone, Other, Topiramate, and Wound dressing adhesive    Review of Systems  Gastrointestinal:  Positive for abdominal pain.    Updated Vital Signs BP (!) 181/101   Pulse 87  Temp 98.7 F (37.1 C) (Oral)   Resp 17   SpO2 98%   Physical Exam Vitals and nursing note reviewed.  Constitutional:      General: She is not in acute distress.    Appearance: She is well-developed.  HENT:     Head: Normocephalic and atraumatic.  Eyes:     Conjunctiva/sclera: Conjunctivae normal.  Cardiovascular:     Rate and Rhythm: Normal rate and regular rhythm.     Heart sounds: No murmur heard. Pulmonary:     Effort: Pulmonary effort is normal. No respiratory distress.     Breath sounds: Normal breath sounds.  Abdominal:     Palpations: Abdomen is soft.     Tenderness: There is abdominal tenderness.     Comments: Generalized tenderness worse in left lower quadrant, soft nondistended  Musculoskeletal:        General: No swelling.     Cervical back: Neck supple.     Comments: Mild localized area of ecchymosis to the anterior thigh without significant tenderness, no swelling, tolerates full hip,  knee range of motion without discomfort, neurovasc intact  Skin:    General: Skin is warm and dry.     Capillary Refill: Capillary refill takes less than 2 seconds.  Neurological:     Mental Status: She is alert.  Psychiatric:        Mood and Affect: Mood normal.     (all labs ordered are listed, but only abnormal results are displayed) Labs Reviewed  COMPREHENSIVE METABOLIC PANEL WITH GFR - Abnormal; Notable for the following components:      Result Value   CO2 20 (*)    Total Protein 8.2 (*)    All other components within normal limits  CBC - Abnormal; Notable for the following components:   RBC 5.67 (*)    HCT 48.3 (*)    MCH 25.2 (*)    MCHC 29.6 (*)    All other components within normal limits  URINALYSIS, ROUTINE W REFLEX MICROSCOPIC - Abnormal; Notable for the following components:   APPearance HAZY (*)    Leukocytes,Ua TRACE (*)    Bacteria, UA RARE (*)    All other components within normal limits  LIPASE, BLOOD    EKG: None  Radiology: CT ABDOMEN PELVIS W CONTRAST Result Date: 08/14/2024 CLINICAL DATA:  Left lower quadrant abdominal pain. Nausea and vomiting. EXAM: CT ABDOMEN AND PELVIS WITH CONTRAST TECHNIQUE: Multidetector CT imaging of the abdomen and pelvis was performed using the standard protocol following bolus administration of intravenous contrast. RADIATION DOSE REDUCTION: This exam was performed according to the departmental dose-optimization program which includes automated exposure control, adjustment of the mA and/or kV according to patient size and/or use of iterative reconstruction technique. CONTRAST:  OMNIPAQUE  IOHEXOL  300 MG/ML  SOLN COMPARISON:  CT 07/23/2024, MRI 07/24/2024. Multiple prior exams reviewed. FINDINGS: Lower chest: No basilar airspace disease or pleural effusion. Hepatobiliary: Redemonstrated intra and extrahepatic biliary ductal dilatation, without significant interval change. No focal liver lesion. Question of mild hepatic  steatosis. Cholecystectomy. Pancreas: Again seen pancreatic ductal dilatation up to 9 mm proximally. No peripancreatic inflammation. Periampullary soft tissue fullness is similar to prior imaging, for example series 2, image 36, but no well-defined mass. Spleen: Normal in size without focal abnormality. Adrenals/Urinary Tract: Stable left adrenal myelolipoma. Normal right adrenal gland. No hydronephrosis or perinephric edema. Homogeneous renal enhancement with symmetric excretion on delayed phase imaging. Urinary bladder is partially distended without wall thickening. Streak artifact from left  hip arthroplasty partially obscures detailed assessment. Stomach/Bowel: Unremarkable appearance of the stomach. The duodenum is decompressed and not well assessed. Periampullary soft tissue fullness is again seen, series 8, image 66. No small bowel obstruction or inflammatory change. A few fluid-filled loops of small bowel without wall thickening. High-riding cecum in the mid abdomen. The appendix is not definitively seen on the current exam. Majority of the colon is decompressed. Descending and sigmoid colonic diverticulosis. Mild wall thickening about the sigmoid in the region of multiple diverticula, series 12, image 70 7 May represent mild diverticulitis. No perforation or abscess. Vascular/Lymphatic: Normal caliber abdominal aorta. No abdominopelvic adenopathy. The portal and splenic veins are patent. Reproductive: Status post hysterectomy. No adnexal masses. Other: No ascites, free air, or focal fluid collection. No abdominal wall hernia. Musculoskeletal: Left hip arthroplasty. Diffuse degenerative change in the spine. IMPRESSION: 1. Descending and sigmoid colonic diverticulosis. Mild wall thickening about the sigmoid in the region of multiple diverticula may represent mild diverticulitis. No perforation or abscess. 2. Unchanged intra and extrahepatic biliary ductal dilatation and pancreatic ductal dilatation.  Periampullary soft tissue fullness is similar to prior imaging. Endoscopic evaluation is recommended. 3. Stable left adrenal myelolipoma. Electronically Signed   By: Andrea Gasman M.D.   On: 08/14/2024 15:02     Procedures   Medications Ordered in the ED  alum & mag hydroxide-simeth (MAALOX/MYLANTA) 200-200-20 MG/5ML suspension 30 mL (30 mLs Oral Patient Refused/Not Given 08/14/24 1442)  hydrALAZINE  (APRESOLINE ) injection 10 mg (has no administration in time range)  HYDROmorphone  (DILAUDID ) injection 0.5 mg (has no administration in time range)  dicyclomine  (BENTYL ) capsule 10 mg (10 mg Oral Given 08/14/24 1348)  LORazepam  (ATIVAN ) injection 1 mg (1 mg Intravenous Given 08/14/24 1343)  ondansetron  (ZOFRAN ) injection 4 mg (4 mg Intravenous Given 08/14/24 1341)  morphine  (PF) 4 MG/ML injection 4 mg (4 mg Intravenous Given 08/14/24 1346)  sodium chloride  0.9 % bolus 1,000 mL (1,000 mLs Intravenous New Bag/Given 08/14/24 1341)  iohexol  (OMNIPAQUE ) 300 MG/ML solution 100 mL (100 mLs Intravenous Contrast Given 08/14/24 1419)  HYDROmorphone  (DILAUDID ) injection 1 mg (1 mg Intravenous Given 08/14/24 1445)    Clinical Course as of 08/14/24 1642  Sat Aug 14, 2024  1253 Patient with history of recurrent diverticulitis and ulcerative colitis evaluated for complaints of abdominal pain with associated nausea, vomiting and nonbloody diarrhea.  Upon presentation she is mildly tachycardic, otherwise hemodynamically stable.  She has generalized abdominal tenderness worse to the left lower quadrant region.  Regarding her right lower extremity, she does have the small localized area of ecchymosis without significant associated tenderness.  Tolerates full range of motion of her hip and knee.  Neurovascular intact.  Will obtain routine abdominal labs and repeat CT scan.  Do not feel that any imaging is indicated today for right lower extremity. [JT]  1347 CBC(!) No leukocytosis, hemoglobin stable [JT]  1410  Lipase, blood Within normal limits [JT]  1411 Comprehensive metabolic panel(!) Bicarb of 20, no gap, glucose and potassium within normal limits [JT]  1524 CT ABDOMEN PELVIS W CONTRAST Descending and sigmoid colonic diverticulitis with mild wall thickening that may represent mild diverticulitis.  No perforation or abscess.  The remainder of her findings are unchanged. [JT]  1546 Urinalysis, Routine w reflex microscopic -Urine, Clean Catch(!) Rare bacteria with trace leukocytes, 0-5 WBCs however asymptomatic, will send in culture and hold off on empiric treatment [JT]  1626 Discussed patient with GI, Dr. Kriss.  Recommended discharge home with treatment for diverticulitis and outpatient  follow-up with [facility]. [JT]    Clinical Course User Index [JT] Donnajean Lynwood DEL, PA-C                                 Medical Decision Making Amount and/or Complexity of Data Reviewed Labs: ordered. Decision-making details documented in ED Course. Radiology: ordered. Decision-making details documented in ED Course.  Risk OTC drugs. Prescription drug management.   This patient presents to the ED with chief complaint(s) of abdominal pain.  The complaint involves an extensive differential diagnosis and also carries with it a high risk of complications and morbidity.   Pertinent past medical history as listed in HPI  The differential diagnosis includes  Lower suspicion for ulcerative colitis as patient is without any bloody stools.  Recent CRP and calprotectin are within normal limits Additional history obtained: Records reviewed previous admission documents and Care Everywhere/External Records  Disposition:   Patient will be discharged home. The patient has been appropriately medically screened and/or stabilized in the ED. I have low suspicion for any other emergent medical condition which would require further screening, evaluation or treatment in the ED or require inpatient management. At time  of discharge the patient is hemodynamically stable and in no acute distress. I have discussed work-up results and diagnosis with patient and answered all questions. Patient is agreeable with discharge plan. We discussed strict return precautions for returning to the emergency department and they verbalized understanding.     Social Determinants of Health:   none  This note was dictated with voice recognition software.  Despite best efforts at proofreading, errors may have occurred which can change the documentation meaning.       Final diagnoses:  Diverticulitis    ED Discharge Orders          Ordered    amoxicillin -clavulanate (AUGMENTIN ) 875-125 MG tablet  Every 8 hours        08/14/24 1641    ondansetron  (ZOFRAN -ODT) 4 MG disintegrating tablet  Every 8 hours PRN        08/14/24 1641               Donnajean Lynwood DEL, PA-C 08/14/24 1716    Garrick Charleston, MD 08/14/24 2249

## 2024-08-14 NOTE — ED Notes (Signed)
 US  PIV placed R forearm, 20g 1.88.

## 2024-08-14 NOTE — ED Triage Notes (Signed)
 Pt reports abdominal pain with nausea and vomiting that started on Wednesday. Pt also reports while walking to the bathroom at home she fell and reports right leg pain. No deformity notes.

## 2024-08-15 ENCOUNTER — Encounter (HOSPITAL_COMMUNITY): Payer: Self-pay | Admitting: Emergency Medicine

## 2024-08-15 ENCOUNTER — Inpatient Hospital Stay (HOSPITAL_COMMUNITY)
Admission: EM | Admit: 2024-08-15 | Discharge: 2024-08-19 | DRG: 378 | Disposition: A | Discharge status: Home / Self Care | Attending: Internal Medicine | Admitting: Internal Medicine

## 2024-08-15 DIAGNOSIS — Z90710 Acquired absence of both cervix and uterus: Secondary | ICD-10-CM | POA: Diagnosis not present

## 2024-08-15 DIAGNOSIS — Z7985 Long-term (current) use of injectable non-insulin antidiabetic drugs: Secondary | ICD-10-CM | POA: Diagnosis not present

## 2024-08-15 DIAGNOSIS — Z820 Family history of epilepsy and other diseases of the nervous system: Secondary | ICD-10-CM

## 2024-08-15 DIAGNOSIS — I10 Essential (primary) hypertension: Secondary | ICD-10-CM | POA: Diagnosis present

## 2024-08-15 DIAGNOSIS — K5733 Diverticulitis of large intestine without perforation or abscess with bleeding: Secondary | ICD-10-CM | POA: Diagnosis present

## 2024-08-15 DIAGNOSIS — E66811 Obesity, class 1: Secondary | ICD-10-CM | POA: Diagnosis present

## 2024-08-15 DIAGNOSIS — K5792 Diverticulitis of intestine, part unspecified, without perforation or abscess without bleeding: Principal | ICD-10-CM | POA: Diagnosis present

## 2024-08-15 DIAGNOSIS — Z800 Family history of malignant neoplasm of digestive organs: Secondary | ICD-10-CM

## 2024-08-15 DIAGNOSIS — M329 Systemic lupus erythematosus, unspecified: Secondary | ICD-10-CM | POA: Diagnosis present

## 2024-08-15 DIAGNOSIS — E785 Hyperlipidemia, unspecified: Secondary | ICD-10-CM | POA: Diagnosis present

## 2024-08-15 DIAGNOSIS — Z6836 Body mass index (BMI) 36.0-36.9, adult: Secondary | ICD-10-CM

## 2024-08-15 DIAGNOSIS — Z96653 Presence of artificial knee joint, bilateral: Secondary | ICD-10-CM | POA: Diagnosis present

## 2024-08-15 DIAGNOSIS — Z9049 Acquired absence of other specified parts of digestive tract: Secondary | ICD-10-CM | POA: Diagnosis not present

## 2024-08-15 DIAGNOSIS — K5190 Ulcerative colitis, unspecified, without complications: Secondary | ICD-10-CM | POA: Diagnosis present

## 2024-08-15 DIAGNOSIS — Z803 Family history of malignant neoplasm of breast: Secondary | ICD-10-CM | POA: Diagnosis not present

## 2024-08-15 DIAGNOSIS — F32A Depression, unspecified: Secondary | ICD-10-CM | POA: Diagnosis present

## 2024-08-15 DIAGNOSIS — K51911 Ulcerative colitis, unspecified with rectal bleeding: Secondary | ICD-10-CM | POA: Diagnosis present

## 2024-08-15 DIAGNOSIS — Z91048 Other nonmedicinal substance allergy status: Secondary | ICD-10-CM

## 2024-08-15 DIAGNOSIS — Z885 Allergy status to narcotic agent status: Secondary | ICD-10-CM

## 2024-08-15 DIAGNOSIS — Z79899 Other long term (current) drug therapy: Secondary | ICD-10-CM

## 2024-08-15 DIAGNOSIS — Z23 Encounter for immunization: Secondary | ICD-10-CM | POA: Diagnosis present

## 2024-08-15 DIAGNOSIS — G40909 Epilepsy, unspecified, not intractable, without status epilepticus: Secondary | ICD-10-CM | POA: Diagnosis present

## 2024-08-15 DIAGNOSIS — K219 Gastro-esophageal reflux disease without esophagitis: Secondary | ICD-10-CM | POA: Diagnosis present

## 2024-08-15 DIAGNOSIS — Z8249 Family history of ischemic heart disease and other diseases of the circulatory system: Secondary | ICD-10-CM

## 2024-08-15 DIAGNOSIS — E119 Type 2 diabetes mellitus without complications: Secondary | ICD-10-CM | POA: Diagnosis present

## 2024-08-15 DIAGNOSIS — Z96642 Presence of left artificial hip joint: Secondary | ICD-10-CM | POA: Diagnosis present

## 2024-08-15 DIAGNOSIS — Z888 Allergy status to other drugs, medicaments and biological substances status: Secondary | ICD-10-CM

## 2024-08-15 DIAGNOSIS — Z91040 Latex allergy status: Secondary | ICD-10-CM

## 2024-08-15 DIAGNOSIS — K5100 Ulcerative (chronic) pancolitis without complications: Secondary | ICD-10-CM | POA: Diagnosis not present

## 2024-08-15 DIAGNOSIS — K51919 Ulcerative colitis, unspecified with unspecified complications: Secondary | ICD-10-CM | POA: Diagnosis not present

## 2024-08-15 DIAGNOSIS — E1100 Type 2 diabetes mellitus with hyperosmolarity without nonketotic hyperglycemic-hyperosmolar coma (NKHHC): Secondary | ICD-10-CM | POA: Diagnosis not present

## 2024-08-15 DIAGNOSIS — Z886 Allergy status to analgesic agent status: Secondary | ICD-10-CM

## 2024-08-15 DIAGNOSIS — Z880 Allergy status to penicillin: Secondary | ICD-10-CM

## 2024-08-15 DIAGNOSIS — Z881 Allergy status to other antibiotic agents status: Secondary | ICD-10-CM | POA: Diagnosis not present

## 2024-08-15 LAB — COMPREHENSIVE METABOLIC PANEL WITH GFR
ALT: 9 U/L (ref 0–44)
AST: 15 U/L (ref 15–41)
Albumin: 3.9 g/dL (ref 3.5–5.0)
Alkaline Phosphatase: 71 U/L (ref 38–126)
Anion gap: 12 (ref 5–15)
BUN: 11 mg/dL (ref 8–23)
CO2: 22 mmol/L (ref 22–32)
Calcium: 9.1 mg/dL (ref 8.9–10.3)
Chloride: 105 mmol/L (ref 98–111)
Creatinine, Ser: 0.67 mg/dL (ref 0.44–1.00)
GFR, Estimated: >60 mL/min (ref >60)
Glucose, Bld: 85 mg/dL (ref 70–99)
Potassium: 3.7 mmol/L (ref 3.5–5.1)
Sodium: 138 mmol/L (ref 135–145)
Total Bilirubin: 0.3 mg/dL (ref 0.0–1.2)
Total Protein: 7.3 g/dL (ref 6.5–8.1)

## 2024-08-15 LAB — CBC
HCT: 43.1 % (ref 36.0–46.0)
Hemoglobin: 13.3 g/dL (ref 12.0–15.0)
MCH: 26.1 pg (ref 26.0–34.0)
MCHC: 30.9 g/dL (ref 30.0–36.0)
MCV: 84.5 fL (ref 80.0–100.0)
Platelets: 285 K/uL (ref 150–400)
RBC: 5.1 MIL/uL (ref 3.87–5.11)
RDW: 15.1 % (ref 11.5–15.5)
WBC: 6.3 K/uL (ref 4.0–10.5)
nRBC: 0 % (ref 0.0–0.2)

## 2024-08-15 MED ORDER — MELATONIN 5 MG PO TABS
5.0000 mg | ORAL_TABLET | Freq: Every evening | ORAL | Status: DC | PRN
  Administered 2024-08-16 – 2024-08-18 (×3): 5 mg via ORAL
  Filled 2024-08-15 (×3): qty 1

## 2024-08-15 MED ORDER — HYDROMORPHONE HCL 1 MG/ML IJ SOLN
0.5000 mg | Freq: Once | INTRAMUSCULAR | Status: AC
  Administered 2024-08-15: 0.5 mg via INTRAVENOUS
  Filled 2024-08-15: qty 1

## 2024-08-15 MED ORDER — HYDROMORPHONE HCL 1 MG/ML IJ SOLN
0.5000 mg | Freq: Once | INTRAMUSCULAR | Status: DC
Start: 2024-08-15 — End: 2024-08-15
  Administered 2024-08-15: 0.5 mg via INTRAMUSCULAR
  Filled 2024-08-15: qty 1

## 2024-08-15 MED ORDER — FLUOXETINE HCL 20 MG PO CAPS
20.0000 mg | ORAL_CAPSULE | Freq: Every day | ORAL | Status: DC
  Administered 2024-08-16 – 2024-08-19 (×4): 20 mg via ORAL
  Filled 2024-08-15 (×4): qty 1

## 2024-08-15 MED ORDER — HYDROMORPHONE HCL 1 MG/ML IJ SOLN
0.5000 mg | INTRAMUSCULAR | Status: DC | PRN
  Administered 2024-08-15 – 2024-08-18 (×11): 0.5 mg via INTRAVENOUS
  Filled 2024-08-15 (×11): qty 0.5

## 2024-08-15 MED ORDER — INFLUENZA VAC SPLIT HIGH-DOSE 0.5 ML IM SUSY
0.5000 mL | PREFILLED_SYRINGE | INTRAMUSCULAR | Status: AC
  Administered 2024-08-17: 0.5 mL via INTRAMUSCULAR
  Filled 2024-08-15: qty 0.5

## 2024-08-15 MED ORDER — LORAZEPAM 2 MG/ML IJ SOLN
0.5000 mg | INTRAMUSCULAR | Status: DC | PRN
  Administered 2024-08-15 – 2024-08-18 (×6): 0.5 mg via INTRAVENOUS
  Filled 2024-08-15 (×7): qty 1

## 2024-08-15 MED ORDER — EZETIMIBE 10 MG PO TABS
10.0000 mg | ORAL_TABLET | Freq: Every day | ORAL | Status: DC
  Administered 2024-08-16 – 2024-08-19 (×4): 10 mg via ORAL
  Filled 2024-08-15 (×4): qty 1

## 2024-08-15 MED ORDER — PNEUMOCOCCAL 20-VAL CONJ VACC 0.5 ML IM SUSY
0.5000 mL | PREFILLED_SYRINGE | INTRAMUSCULAR | Status: AC
  Administered 2024-08-17: 0.5 mL via INTRAMUSCULAR
  Filled 2024-08-15: qty 0.5

## 2024-08-15 MED ORDER — LORAZEPAM 2 MG/ML IJ SOLN
1.0000 mg | Freq: Once | INTRAMUSCULAR | Status: AC
  Administered 2024-08-15: 1 mg via INTRAVENOUS
  Filled 2024-08-15: qty 1

## 2024-08-15 MED ORDER — ATORVASTATIN CALCIUM 10 MG PO TABS
20.0000 mg | ORAL_TABLET | Freq: Every day | ORAL | Status: DC
  Administered 2024-08-18 – 2024-08-19 (×2): 20 mg via ORAL
  Filled 2024-08-15 (×2): qty 2

## 2024-08-15 MED ORDER — HYDROMORPHONE HCL 1 MG/ML IJ SOLN
1.0000 mg | Freq: Once | INTRAMUSCULAR | Status: AC
  Administered 2024-08-15: 1 mg via INTRAVENOUS
  Filled 2024-08-15: qty 1

## 2024-08-15 MED ORDER — ONDANSETRON HCL 4 MG/2ML IJ SOLN
4.0000 mg | Freq: Four times a day (QID) | INTRAMUSCULAR | Status: DC | PRN
  Administered 2024-08-15 – 2024-08-19 (×4): 4 mg via INTRAVENOUS
  Filled 2024-08-15 (×4): qty 2

## 2024-08-15 MED ORDER — ONDANSETRON HCL 4 MG PO TABS
4.0000 mg | ORAL_TABLET | Freq: Four times a day (QID) | ORAL | Status: DC | PRN
  Administered 2024-08-16: 4 mg via ORAL
  Filled 2024-08-15 (×2): qty 1

## 2024-08-15 MED ORDER — HYDROXYZINE HCL 10 MG PO TABS
10.0000 mg | ORAL_TABLET | Freq: Three times a day (TID) | ORAL | Status: DC | PRN
  Administered 2024-08-16 – 2024-08-18 (×3): 10 mg via ORAL
  Filled 2024-08-15 (×3): qty 1

## 2024-08-15 MED ORDER — LACTATED RINGERS IV BOLUS
1000.0000 mL | Freq: Once | INTRAVENOUS | Status: AC
  Administered 2024-08-15: 1000 mL via INTRAVENOUS

## 2024-08-15 MED ORDER — SODIUM CHLORIDE 0.9 % IV SOLN
2.0000 g | INTRAVENOUS | Status: DC
  Administered 2024-08-15 – 2024-08-18 (×4): 2 g via INTRAVENOUS
  Filled 2024-08-15 (×4): qty 20

## 2024-08-15 MED ORDER — ACETAMINOPHEN 325 MG PO TABS: 650.0000 mg | ORAL_TABLET | Freq: Four times a day (QID) | ORAL | Status: DC | PRN

## 2024-08-15 MED ORDER — LOSARTAN POTASSIUM 25 MG PO TABS
25.0000 mg | ORAL_TABLET | Freq: Every day | ORAL | Status: DC
  Administered 2024-08-16 – 2024-08-19 (×4): 25 mg via ORAL
  Filled 2024-08-15 (×4): qty 1

## 2024-08-15 MED ORDER — HYDROMORPHONE HCL 1 MG/ML IJ SOLN
0.5000 mg | Freq: Once | INTRAMUSCULAR | Status: AC
  Administered 2024-08-15: 0.5 mg via INTRAVENOUS
  Filled 2024-08-15: qty 0.5

## 2024-08-15 MED ORDER — MELATONIN 3 MG PO TABS
3.0000 mg | ORAL_TABLET | Freq: Every evening | ORAL | Status: DC | PRN
  Administered 2024-08-15: 3 mg via ORAL
  Filled 2024-08-15: qty 1

## 2024-08-15 MED ORDER — LACTATED RINGERS IV SOLN
INTRAVENOUS | Status: AC
Start: 2024-08-15 — End: 2024-08-16

## 2024-08-15 MED ORDER — METRONIDAZOLE 500 MG/100ML IV SOLN
500.0000 mg | Freq: Two times a day (BID) | INTRAVENOUS | Status: DC
  Administered 2024-08-15 – 2024-08-19 (×8): 500 mg via INTRAVENOUS
  Filled 2024-08-15 (×8): qty 100

## 2024-08-15 MED ORDER — OXYCODONE HCL 5 MG PO TABS
20.0000 mg | ORAL_TABLET | Freq: Four times a day (QID) | ORAL | Status: DC | PRN
  Administered 2024-08-16 – 2024-08-19 (×7): 20 mg via ORAL
  Filled 2024-08-15 (×7): qty 4

## 2024-08-15 MED ORDER — ACETAMINOPHEN 650 MG RE SUPP: 650.0000 mg | Freq: Four times a day (QID) | RECTAL | Status: DC | PRN

## 2024-08-15 NOTE — ED Provider Notes (Signed)
 Signout received on this patient.  Pending consult with hospitalist. Physical Exam  BP (!) 156/106 (BP Location: Left Arm)   Pulse 90   Temp 97.9 F (36.6 C) (Oral)   Resp 18   SpO2 100%     Procedures  Procedures  ED Course / MDM    Medical Decision Making Amount and/or Complexity of Data Reviewed Labs: ordered.  Risk Prescription drug management. Decision regarding hospitalization.   Discussed with hospitalist will evaluate patient for admission.       Hildegard Loge, PA-C 08/15/24 1513    Franklyn Sid SAILOR, MD 08/15/24 2031

## 2024-08-15 NOTE — ED Notes (Signed)
 Pt gave a urine sample sent to the lab

## 2024-08-15 NOTE — H&P (Signed)
 History and Physical    Patient: Erin Wolf FMW:[id number] DOB: 02/01/1956 DOA: 08/15/2024 DOS: the patient was seen and examined on 08/15/2024 PCP: Jerome Heron Ruth, PA-C  Patient coming from: Home  Chief Complaint:  Chief Complaint  Patient presents with   Abdominal Pain   HPI: Erin Wolf is a 68 y.o. female with medical history significant for ulcerative colitis, recurrent diverticulitis, possible lupus, intermittent  stool incontinence who presents to the ED now 2 days in a row.  She was seen yesterday and had a CT of her abdomen pelvis which was consistent with diverticulitis.  She was discharged on p.o. Augmentin .  She comes back today because of unrelenting abdominal pain.  She also had an episode of a bloody stool x 1 today.  The patient says she can tell a difference between ulcerative colitis flare versus diverticulitis.  Either can cause bloody stools but she feels like this is diverticulitis.  She is not having mucus per rectum which is usually the hallmark of her ulcerative colitis.  She says the diverticulitis pain is usually worse than UC and this pain is is pretty severe.  She denies any fevers.  She does have trouble with vomiting.  She did take the Augmentin  prescription today. In the emergency department the hospitalist were called for admission.  She did not have a repeat CT scan of her abdomen pelvis. The patient follows up with multiple doctors and takes her medication as prescribed but she seems to have poor insight into her diseases overall.  She is allergic to Cipro  and penicillins though she can take cephalosporins and Augmentin .   Review of Systems: As mentioned in the history of present illness. All other systems reviewed and are negative. Past Medical History:  Diagnosis Date   Anxiety    Arthritis    Chronic abdominal pain    Chronic lower back pain    Chronic nausea    Depression    Diabetes mellitus without complication (HCC)    not on  meds   Diverticulosis    Dyspnea    with exertion   Frequency of urination    GERD (gastroesophageal reflux disease)    Headache    Hematuria    Hepatitis    pt denies   Hepatitis C antibody positive in blood    per pt told by pcp   History of panic attacks    History of syncope    hx recurrent syncope --- per epic domentation non-cardiac , orthostatic hypotension, anxiety, dehydration   History of uterine fibroid    HTN (hypertension), benign    Hyperlipidemia 01/03/2012   IBS (irritable bowel syndrome)    Myalgia    Sciatic nerve disease, left 2023   Seizure disorder (HCC) followed by pcp until new neurologist appr. in jan 2019   started having them in my 20's ,  petit mal ----  per pt last seizure one 2015   SLE (systemic lupus erythematosus) [facility])    rheumatologist-- dr ziolkowska (consult 11-12-2016) having work-up done   Ulcerative colitis    follow[facility] dr lennard at [facility]   Wears glasses    Past Surgical History:  Procedure Laterality Date   ABDOMINAL HYSTERECTOMY  1988   ARTERY BIOPSY Left 06/11/2022   Procedure: LEFT BIOPSY TEMPORAL ARTERY;  Surgeon: Stevie Herlene Righter, MD;  Location: [facility];  Service: General;  Laterality: Left;   CHOLECYSTECTOMY OPEN  1978   COLONOSCOPY N/A 05/31/2013   Procedure: COLONOSCOPY;  Surgeon:  Lesta JULIANNA Fitz, MD;  Location: [facility] ENDOSCOPY;  Service: Endoscopy;  Laterality: N/A;   CYSTOSCOPY/RETROGRADE/URETEROSCOPY Bilateral 07/23/2017   Procedure: CYSTOSCOPY/RETROGRADE/URETEROSCOPY;  Surgeon: Devere Lonni Righter, MD;  Location: [facility];  Service: Urology;  Laterality: Bilateral;   detached retina on right eye surgery      ESOPHAGOGASTRODUODENOSCOPY (EGD) WITH PROPOFOL  N/A 05/11/2014   Procedure: ESOPHAGOGASTRODUODENOSCOPY (EGD) WITH PROPOFOL ;  Surgeon: Jerrell KYM Sol, MD;  Location: [facility];  Service: Endoscopy;  Laterality: N/A;  egd first   ESOPHAGOGASTRODUODENOSCOPY (EGD) WITH PROPOFOL  N/A  05/08/2020   Procedure: ESOPHAGOGASTRODUODENOSCOPY (EGD) WITH PROPOFOL ;  Surgeon: Burnette Fallow, MD;  Location: [facility];  Service: Endoscopy;  Laterality: N/A;   EUS N/A 06/21/2016   Procedure: ESOPHAGEAL ENDOSCOPIC ULTRASOUND (EUS) RADIAL;  Surgeon: Fallow Burnette, MD;  Location: [facility];  Service: Endoscopy;  Laterality: N/A;   FLEXIBLE SIGMOIDOSCOPY N/A 05/11/2014   Procedure: FLEXIBLE SIGMOIDOSCOPY;  Surgeon: Jerrell KYM Sol, MD;  Location: [facility];  Service: Endoscopy;  Laterality: N/A;   LAPAROTOMY W/ BILATERAL SALPINGOOPHORECTOMY  09-26-2003   dr johnnye at [facility]   LAPROSCOPY W/ LYSIS ADHESIONS  06-08-2003   dr leva [facility]   TOTAL HIP ARTHROPLASTY Left 12/03/2022   Procedure: TOTAL HIP ARTHROPLASTY ANTERIOR APPROACH;  Surgeon: Ernie Cough, MD;  Location: [facility];  Service: Orthopedics;  Laterality: Left;   TOTAL KNEE ARTHROPLASTY Left 12/19/2020   Procedure: TOTAL KNEE ARTHROPLASTY;  Surgeon: Ernie Cough, MD;  Location: [facility];  Service: Orthopedics;  Laterality: Left;  70 mins   TOTAL KNEE ARTHROPLASTY Right 03/27/2021   Procedure: TOTAL KNEE ARTHROPLASTY;  Surgeon: Ernie Cough, MD;  Location: [facility];  Service: Orthopedics;  Laterality: Right;  70 mins   TRANSTHORACIC ECHOCARDIOGRAM  04/29/2016   ef 60-65%,  grade 2 diastolic dysfunction/  trivial MR and TR   TUBAL LIGATION Bilateral yrs ago   UPPER ESOPHAGEAL ENDOSCOPIC ULTRASOUND (EUS) N/A 05/08/2020   Procedure: UPPER ESOPHAGEAL ENDOSCOPIC ULTRASOUND (EUS);  Surgeon: Burnette Fallow, MD;  Location: [facility];  Service: Endoscopy;  Laterality: N/A;   Social History:  reports that she has never smoked. She has never used smokeless tobacco. She reports that she does not drink alcohol  and does not use drugs.  Allergies  Allergen Reactions   Celebrex [Celecoxib] Nausea Only   Latex Dermatitis and Other (See Comments)    Skin peeling   Neurontin [Gabapentin] Other (See Comments)    Abdominal pain   Nsaids  Other (See Comments)    Colitis flares   Penicillins Hives and Other (See Comments)    Tolerates cefazolin , ceftriaxone , cephalexin , AND Augmentin    Wellbutrin [Bupropion Hcl] Other (See Comments)    Insomnia Headaches    Bayer Aspirin  [Aspirin ] Nausea Only and Other (See Comments)    Stomach pain   Darvocet [Propoxyphene N-Acetaminophen ] Nausea And Vomiting   Ketorolac  Tromethamine  Nausea And Vomiting   Tape Hives   Cipro  [Ciprofloxacin  Hcl] Other (See Comments)    Abdominal pain   Fioricet [Butalbital-Apap-Caffeine] Other (See Comments)    Abdominal pain   Lunesta [Eszopiclone] Swelling   Other Diarrhea and Other (See Comments)    Spicy foods cause diarrhea due to colitis   Topamax [Topiramate] Other (See Comments)    Abdominal pain   Wound Dressing Adhesive Hives    Family History  Problem Relation Age of Onset   Alzheimer's disease Mother    Hypertension Mother    Other Father        ruptured appendix   Breast cancer Sister  Hypertension Sister    Hypertension Sister    Hypertension Sister    Heart disease Sister    Colon cancer Brother    Hypertension Brother    Cancer Brother    Memory loss Brother     Prior to Admission medications   Medication Sig Start Date End Date Taking? Authorizing Provider  acetaminophen  (TYLENOL ) 500 MG tablet Take 1,000 mg by mouth every 6 (six) hours as needed for headache, fever or mild pain (pain score 1-3).   Yes [provider]  amoxicillin -clavulanate (AUGMENTIN ) 875-125 MG tablet Take 1 tablet by mouth every 8 (eight) hours. 08/14/24  Yes Donnajean Lynwood DEL, PA-C  atorvastatin (LIPITOR) 20 MG tablet Take 20 mg by mouth daily.   Yes [provider]  ezetimibe  (ZETIA ) 10 MG tablet Take 10 mg by mouth daily. 04/07/24  Yes [provider]  FLUoxetine  (PROZAC ) 20 MG capsule Take 20 mg by mouth daily.   Yes [provider]  hydrOXYzine  (ATARAX ) 10 MG tablet Take 1 tablet (10 mg total) by mouth 3  (three) times daily as needed for anxiety or itching. 04/01/24  Yes Cheryle Page, MD  linaclotide  (LINZESS ) 72 MCG capsule Take 72 mcg by mouth daily as needed (constipation).   Yes [provider]  losartan (COZAAR) 25 MG tablet Take 25 mg by mouth daily.   Yes [provider]  MELATONIN PO Take 1 tablet by mouth at bedtime as needed (sleep).   Yes [provider]  Multiple Vitamins-Minerals (CENTRUM SILVER WOMEN 50+) TABS Take 1 tablet by mouth daily.   Yes [provider]  Oxycodone  HCl 20 MG TABS Take 20 mg by mouth 4 (four) times daily as needed (severe pain not relieved by tramadol).   Yes [provider]  OZEMPIC, 1 MG/DOSE, 4 MG/3ML SOPN Inject 1 mg into the skin every Monday. 07/15/24  Yes [provider]  Polyvinyl Alcohol -Povidone (REFRESH OP) Place 1 drop into both eyes 2 (two) times daily.   Yes [provider]  promethazine  (PHENERGAN ) 12.5 MG tablet Take 12.5 mg by mouth 3 (three) times daily as needed for nausea or vomiting. 07/17/24  Yes [provider]  traMADol (ULTRAM) 50 MG tablet Take 50 mg by mouth every 6 (six) hours as needed (pain not relieived by Tylenol ). 07/03/24  Yes [provider]  dicyclomine  (BENTYL ) 20 MG tablet Take 1 tablet (20 mg total) by mouth 3 (three) times daily as needed (abdominal pain). Patient not taking: Reported on 08/15/2024 07/25/24   Vann, Jessica U, DO  naloxone  (NARCAN ) nasal spray 4 mg/0.1 mL Place 1 spray into the nose once as needed (opioid overdose). Patient not taking: Reported on 08/15/2024    [provider]  ondansetron  (ZOFRAN -ODT) 4 MG disintegrating tablet Take 1 tablet (4 mg total) by mouth every 8 (eight) hours as needed for nausea or vomiting (dissolve orally). Patient not taking: Reported on 08/15/2024 08/14/24   Donnajean Lynwood DEL, PA-C    Physical Exam: Vitals:   08/15/24 1149 08/15/24 1347 08/15/24 1457 08/15/24 1607  BP: (!) 138/95 (!)  156/106  (!) 146/63  Pulse: 97 90  90  Resp: 16 18  18   Temp: 97.9 F (36.6 C)   97.6 F (36.4 C)  TempSrc: Oral   Oral  SpO2: 100% 100% 100% 96%   Physical Exam:  General: No acute distress, well developed, well nourished HEENT: Normocephalic, atraumatic, PERRL, oral mucosa dry Cardiovascular: Normal rate and rhythm. Distal pulses intact. Pulmonary: Normal  pulmonary effort, normal breath sounds Gastrointestinal: Nondistended abdomen, soft, tender in left lower quad, normoactive bowel sounds Musculoskeletal:No lower ext edema Lymphadenopathy: No cervical LAD. Skin: Skin is warm and dry. Neuro: No focal deficits noted, AAOx3. PSYCH: Attentive and cooperative  Data Reviewed:  Results for orders placed or performed during the hospital encounter of 08/15/24 (from the past 24 hours)  CBC     Status: None   Collection Time: 08/15/24  1:53 PM  Result Value Ref Range   WBC 6.3 4.0 - 10.5 K/uL   RBC 5.10 3.87 - 5.11 MIL/uL   Hemoglobin 13.3 12.0 - 15.0 g/dL   HCT 56.8 63.9 - 53.9 %   MCV 84.5 80.0 - 100.0 fL   MCH 26.1 26.0 - 34.0 pg   MCHC 30.9 30.0 - 36.0 g/dL   RDW 84.8 88.4 - 84.4 %   Platelets 285 150 - 400 K/uL   nRBC 0.0 0.0 - 0.2 %  Comprehensive metabolic panel     Status: None   Collection Time: 08/15/24  1:53 PM  Result Value Ref Range   Sodium 138 135 - 145 mmol/L   Potassium 3.7 3.5 - 5.1 mmol/L   Chloride 105 98 - 111 mmol/L   CO2 22 22 - 32 mmol/L   Glucose, Bld 85 70 - 99 mg/dL   BUN 11 8 - 23 mg/dL   Creatinine, Ser 9.32 0.44 - 1.00 mg/dL   Calcium  9.1 8.9 - 10.3 mg/dL   Total Protein 7.3 6.5 - 8.1 g/dL   Albumin 3.9 3.5 - 5.0 g/dL   AST 15 15 - 41 U/L   ALT 9 0 - 44 U/L   Alkaline Phosphatase 71 38 - 126 U/L   Total Bilirubin 0.3 0.0 - 1.2 mg/dL   GFR, Estimated >39 >39 mL/min   Anion gap 12 5 - 15     Assessment and Plan: Acute diverticulitis - according to the patient she has had at least 5 episodes of diverticulitis this year alone.  - IV  Rocephin  and Flagyl  (She is allergic to Cipro  and PCN)  - Pain control  - She has an outpatient appointment with [facility] next week.  The patient has a brother who died of colon cancer so she has colonoscopies every 2 years. - I highly recommend general surgery  appointment at discharge also  2.  Intermittent stool incontinence advised keep her to follow-up with GI.  3.  Ulcerative colitis - she had approximately 3 episodes of ulcerative colitis this year.  She takes a prescription enema when she has a flareup.  She is currently out of that but is working on getting it from a science writer.  4.  Possible lupus - she says she was diagnosed many years ago at [facility] but they were not sure that she had it.  She has been referred to rheumatology in this area but has not had all the retesting done yet.  This mainly manifests as a recurrent rash on her face and joint pain.    Advance Care Planning:   Code Status: Prior  The patient names her husband is her surrogate decision maker and she wants to be full code.  Consults: None  Family Communication: None  Severity of Illness: The appropriate patient status for this patient is INPATIENT. Inpatient status is judged to be reasonable and necessary in order to provide the required intensity of service to ensure the patient's safety. The patient's presenting symptoms, physical exam findings, and initial radiographic  and laboratory data in the context of their chronic comorbidities is felt to place them at high risk for further clinical deterioration. Furthermore, it is not anticipated that the patient will be medically stable for discharge from the hospital within 2 midnights of admission.   * I certify that at the point of admission it is my clinical judgment that the patient will require inpatient hospital care spanning beyond 2 midnights from the point of admission due to high intensity of service, high risk for further deterioration and high  frequency of surveillance required.*  Author: ARTHEA CHILD, MD 08/15/2024 4:41 PM  For on call review [email or web address].

## 2024-08-15 NOTE — ED Notes (Signed)
 US  PIV placed, R forearm 20g 1.88''

## 2024-08-15 NOTE — ED Provider Notes (Signed)
 [place] EMERGENCY DEPARTMENT AT [facility] Provider Note   CSN: [id number] Arrival date & time: 08/15/24  1137     Patient presents with: Abdominal Pain   Erin Wolf is a 68 y.o. female with past medical history of ulcerative colitis, IBS, GERD, hypertension, depression, anxiety who presents emergency department for evaluation of abdominal pain.  Patient was seen and evaluated in the emergency department yesterday and had confirmed diverticulitis on CT.  Patient was discharged with home prescription of Augmentin .  However, she returns today due to increased pain and hematochezia and possible hematuria.  Patient reports continued diarrhea.  No nausea or vomiting.  She does report compliance with the Augmentin  since discharge.  She has been unable to eat or drink without significant discomfort.   Abdominal Pain      Prior to Admission medications   Medication Sig Start Date End Date Taking? Authorizing Provider  amoxicillin -clavulanate (AUGMENTIN ) 875-125 MG tablet Take 1 tablet by mouth every 8 (eight) hours. 08/14/24   Donnajean Lynwood DEL, PA-C  atorvastatin (LIPITOR) 10 MG tablet Take 10 mg by mouth daily.    [provider]  dicyclomine  (BENTYL ) 20 MG tablet Take 1 tablet (20 mg total) by mouth 3 (three) times daily as needed (abdominal pain). 07/25/24   Vann, Jessica U, DO  ezetimibe  (ZETIA ) 10 MG tablet Take 10 mg by mouth in the morning. 04/07/24   [provider]  FLUoxetine  (PROZAC ) 20 MG capsule Take 20 mg by mouth in the morning.    [provider]  hydrOXYzine  (ATARAX ) 10 MG tablet Take 1 tablet (10 mg total) by mouth 3 (three) times daily as needed for anxiety or itching. 04/01/24   Cheryle Page, MD  linaclotide  (LINZESS ) 72 MCG capsule Take 72 mcg by mouth daily as needed (constipation).    [provider]  Melatonin 10 MG TABS Take 10 mg by mouth at bedtime as needed (sleep).    [provider]  Multiple  Vitamins-Minerals (CENTRUM WOMEN) TABS Take 1 tablet by mouth daily with breakfast.    [provider]  naloxone  (NARCAN ) nasal spray 4 mg/0.1 mL Place 1 spray into the nose once as needed (opioid overdose).    [provider]  ondansetron  (ZOFRAN -ODT) 4 MG disintegrating tablet Take 1 tablet (4 mg total) by mouth every 8 (eight) hours as needed for nausea or vomiting (dissolve orally). 08/14/24   Donnajean Lynwood DEL, PA-C  Oxycodone  HCl 20 MG TABS Take 20 mg by mouth 4 (four) times daily.    [provider]  OZEMPIC, 1 MG/DOSE, 4 MG/3ML SOPN Inject 1 mg into the skin every Monday. 07/15/24   [provider]  promethazine  (PHENERGAN ) 12.5 MG tablet Take 12.5 mg by mouth 3 (three) times daily as needed for nausea or vomiting. 07/17/24   [provider]  traMADol (ULTRAM) 50 MG tablet Take 50 mg by mouth every 6 (six) hours as needed (for pain). 07/03/24   [provider]    Allergies: Celecoxib, Gabapentin, Latex, Nsaids, Penicillins, Wellbutrin [bupropion hcl], Aspirin , Darvocet [propoxyphene n-acetaminophen ], Ketorolac  tromethamine , Tape, Tylenol  [acetaminophen ], Butalbital-apap-caffeine, Ciprofloxacin , Eszopiclone, Other, Topiramate, and Wound dressing adhesive    Review of Systems  Gastrointestinal:  Positive for abdominal pain and blood in stool.    Updated Vital Signs BP (!) 156/106 (BP Location: Left Arm)   Pulse 90   Temp 97.9 F (36.6 C) (Oral)   Resp 18   SpO2 100%   Physical Exam Vitals and nursing note  reviewed.  Constitutional:      Appearance: Normal appearance.  HENT:     Head: Normocephalic and atraumatic.     Mouth/Throat:     Mouth: Mucous membranes are moist.  Eyes:     General: No scleral icterus.       Right eye: No discharge.        Left eye: No discharge.     Conjunctiva/sclera: Conjunctivae normal.  Cardiovascular:     Rate and Rhythm: Normal rate and regular rhythm.     Pulses: Normal pulses.  Pulmonary:      Effort: Pulmonary effort is normal.     Breath sounds: Normal breath sounds.  Abdominal:     General: There is no distension.     Palpations: Abdomen is soft.     Tenderness: There is abdominal tenderness in the left lower quadrant.     Comments: Patient with focal left lower quadrant tenderness to palpation.  Musculoskeletal:        General: No deformity.     Cervical back: Normal range of motion.  Skin:    General: Skin is warm and dry.     Capillary Refill: Capillary refill takes less than 2 seconds.  Neurological:     Mental Status: She is alert.     Motor: No weakness.  Psychiatric:        Mood and Affect: Mood normal.     (all labs ordered are listed, but only abnormal results are displayed) Labs Reviewed  CBC  COMPREHENSIVE METABOLIC PANEL WITH GFR    EKG: None  Radiology: CT ABDOMEN PELVIS W CONTRAST Result Date: 08/14/2024 CLINICAL DATA:  Left lower quadrant abdominal pain. Nausea and vomiting. EXAM: CT ABDOMEN AND PELVIS WITH CONTRAST TECHNIQUE: Multidetector CT imaging of the abdomen and pelvis was performed using the standard protocol following bolus administration of intravenous contrast. RADIATION DOSE REDUCTION: This exam was performed according to the departmental dose-optimization program which includes automated exposure control, adjustment of the mA and/or kV according to patient size and/or use of iterative reconstruction technique. CONTRAST:  OMNIPAQUE  IOHEXOL  300 MG/ML  SOLN COMPARISON:  CT 07/23/2024, MRI 07/24/2024. Multiple prior exams reviewed. FINDINGS: Lower chest: No basilar airspace disease or pleural effusion. Hepatobiliary: Redemonstrated intra and extrahepatic biliary ductal dilatation, without significant interval change. No focal liver lesion. Question of mild hepatic steatosis. Cholecystectomy. Pancreas: Again seen pancreatic ductal dilatation up to 9 mm proximally. No peripancreatic inflammation. Periampullary soft tissue fullness is  similar to prior imaging, for example series 2, image 36, but no well-defined mass. Spleen: Normal in size without focal abnormality. Adrenals/Urinary Tract: Stable left adrenal myelolipoma. Normal right adrenal gland. No hydronephrosis or perinephric edema. Homogeneous renal enhancement with symmetric excretion on delayed phase imaging. Urinary bladder is partially distended without wall thickening. Streak artifact from left hip arthroplasty partially obscures detailed assessment. Stomach/Bowel: Unremarkable appearance of the stomach. The duodenum is decompressed and not well assessed. Periampullary soft tissue fullness is again seen, series 8, image 66. No small bowel obstruction or inflammatory change. A few fluid-filled loops of small bowel without wall thickening. High-riding cecum in the mid abdomen. The appendix is not definitively seen on the current exam. Majority of the colon is decompressed. Descending and sigmoid colonic diverticulosis. Mild wall thickening about the sigmoid in the region of multiple diverticula, series 12, image 70 7 May represent mild diverticulitis. No perforation or abscess. Vascular/Lymphatic: Normal caliber abdominal aorta. No abdominopelvic adenopathy. The portal and splenic veins are patent. Reproductive: Status post  hysterectomy. No adnexal masses. Other: No ascites, free air, or focal fluid collection. No abdominal wall hernia. Musculoskeletal: Left hip arthroplasty. Diffuse degenerative change in the spine. IMPRESSION: 1. Descending and sigmoid colonic diverticulosis. Mild wall thickening about the sigmoid in the region of multiple diverticula may represent mild diverticulitis. No perforation or abscess. 2. Unchanged intra and extrahepatic biliary ductal dilatation and pancreatic ductal dilatation. Periampullary soft tissue fullness is similar to prior imaging. Endoscopic evaluation is recommended. 3. Stable left adrenal myelolipoma. Electronically Signed   By: Andrea Gasman M.D.   On: 08/14/2024 15:02    Procedures   Medications Ordered in the ED  HYDROmorphone  (DILAUDID ) injection 1 mg (has no administration in time range)  LORazepam  (ATIVAN ) injection 1 mg (1 mg Intravenous Given 08/15/24 1448)  HYDROmorphone  (DILAUDID ) injection 0.5 mg (0.5 mg Intravenous Given 08/15/24 1448)                                Medical Decision Making Amount and/or Complexity of Data Reviewed Labs: ordered.  Risk Prescription drug management. Decision regarding hospitalization.   This patient presents to the ED for concern of abdominal pain, this involves an extensive number of treatment options, and is a complaint that carries with it a high risk of complications and morbidity.   Differential diagnosis includes: Diverticulitis, diverticulitis with abscess, IBD flareup, sepsis, mesenteric ischemia, IBS   Co morbidities:  history of ulcerative colitis and recent diagnosis of diverticulitis on 10/25   Additional history:  Patient was evaluated on 10/25 for same complaint.  She reported to the emergency department at that time with abdominal pain, nausea, vomiting and nonbloody diarrhea that has been ongoing for the past week.  Patient did receive GI cocktail, Dilaudid , morphine , Zofran  and lorazepam  yesterday.  Her CT at that time was consistent with mild diverticulitis without perforation or abscess.  She was discharged with outpatient antibiotics  Lab Tests:  I Ordered, and personally interpreted labs.  The pertinent results include: No acute abnormalities  Imaging Studies:  Imaging not indicated given CT performed yesterday  Cardiac Monitoring/ECG:  The patient was maintained on a cardiac monitor.  I personally viewed and interpreted the cardiac monitored which showed an underlying rhythm of: Normal sinus  Medicines ordered and prescription drug management:  I ordered medication including  Medications  HYDROmorphone  (DILAUDID ) injection 1 mg (has no  administration in time range)  LORazepam  (ATIVAN ) injection 1 mg (1 mg Intravenous Given 08/15/24 1448)  HYDROmorphone  (DILAUDID ) injection 0.5 mg (0.5 mg Intravenous Given 08/15/24 1448)   for pain control Reevaluation of the patient after these medicines showed that the patient improved I have reviewed the patients home medicines and have made adjustments as needed  Test Considered:   none  Critical Interventions:   none  Consultations Obtained: - hospitalist  Problem List / ED Course:     ICD-10-CM   1. Diverticulitis  K57.92       MDM: 68 year old female who presents emergency department for evaluation of abdominal pain.  Patient was seen and evaluated in the ED yesterday and was found to have mild diverticulitis.  She did not have any hematochezia at that time.  She did have diarrhea, nausea, vomiting.  She returns to the ED today because she is reporting hematochezia and possible hematuria as well as increased pain.  She was given Augmentin  outpatient for diverticulitis management and she reports compliance.  She is focally tender in  the left lower quadrant.  I do not feel strongly about reordering a CT given recent findings yesterday.  I did reorder a CBC to assess to evaluate hemoglobin.  I ordered Dilaudid  for pain control.  Upon reassessment, patient CBC shows no acute abnormalities.  CMP is pending at this time.  Patient continues to report pain despite Dilaudid  administration.  I ordered a another dose of Dilaudid .  Patient's RN informed me that she was anxious and experiencing a panic attack.  I ordered 1 mg of IV Ativan .  That appeared to calm the patient down, but she is still anxious.  I explained the plan for this patient which will be hospital admission.  Patient was agreeable to this plan.  Handoff given to Ali, PA-C.   Dispostion:  After consideration of the diagnostic results and the patients response to treatment, I feel that the patient would benefit from  hospital admission.   Final diagnoses:  Diverticulitis    ED Discharge Orders     None          Torrence Marry GORMAN DEVONNA 08/25/24 2158    Francesca Elsie CROME, MD 08/25/24 2235

## 2024-08-15 NOTE — ED Triage Notes (Addendum)
 PT reports continued abdominal pain and nausea. Pt seen and treated for diverticulitis yesterday but reports the pain is no better today. Also reporting hematuria.

## 2024-08-16 DIAGNOSIS — K5100 Ulcerative (chronic) pancolitis without complications: Secondary | ICD-10-CM

## 2024-08-16 DIAGNOSIS — E1100 Type 2 diabetes mellitus with hyperosmolarity without nonketotic hyperglycemic-hyperosmolar coma (NKHHC): Secondary | ICD-10-CM | POA: Diagnosis not present

## 2024-08-16 DIAGNOSIS — K5792 Diverticulitis of intestine, part unspecified, without perforation or abscess without bleeding: Secondary | ICD-10-CM | POA: Diagnosis not present

## 2024-08-16 DIAGNOSIS — I10 Essential (primary) hypertension: Secondary | ICD-10-CM | POA: Diagnosis not present

## 2024-08-16 LAB — BASIC METABOLIC PANEL WITH GFR
Anion gap: 9 (ref 5–15)
BUN: 7 mg/dL — ABNORMAL LOW (ref 8–23)
CO2: 23 mmol/L (ref 22–32)
Calcium: 8.7 mg/dL — ABNORMAL LOW (ref 8.9–10.3)
Chloride: 105 mmol/L (ref 98–111)
Creatinine, Ser: 0.59 mg/dL (ref 0.44–1.00)
GFR, Estimated: >60 mL/min (ref >60)
Glucose, Bld: 89 mg/dL (ref 70–99)
Potassium: 4 mmol/L (ref 3.5–5.1)
Sodium: 137 mmol/L (ref 135–145)

## 2024-08-16 LAB — CBC
HCT: 41.3 % (ref 36.0–46.0)
Hemoglobin: 12.2 g/dL (ref 12.0–15.0)
MCH: 25.5 pg — ABNORMAL LOW (ref 26.0–34.0)
MCHC: 29.5 g/dL — ABNORMAL LOW (ref 30.0–36.0)
MCV: 86.2 fL (ref 80.0–100.0)
Platelets: 258 K/uL (ref 150–400)
RBC: 4.79 MIL/uL (ref 3.87–5.11)
RDW: 15.3 % (ref 11.5–15.5)
WBC: 4.5 K/uL (ref 4.0–10.5)
nRBC: 0 % (ref 0.0–0.2)

## 2024-08-16 LAB — HEMOGLOBIN A1C
Hgb A1c MFr Bld: 5.9 % — ABNORMAL HIGH (ref 4.8–5.6)
Mean Plasma Glucose: 122.63 mg/dL

## 2024-08-16 LAB — TSH: TSH: 3.92 u[IU]/mL (ref 0.350–4.500)

## 2024-08-16 LAB — MAGNESIUM: Magnesium: 2 mg/dL (ref 1.7–2.4)

## 2024-08-16 MED ORDER — HYDRALAZINE HCL 20 MG/ML IJ SOLN: 10.0000 mg | Freq: Four times a day (QID) | INTRAMUSCULAR | Status: DC | PRN

## 2024-08-16 MED ORDER — LACTATED RINGERS IV SOLN: INTRAVENOUS | Status: AC

## 2024-08-16 MED ORDER — DICYCLOMINE HCL 10 MG PO CAPS
10.0000 mg | ORAL_CAPSULE | Freq: Three times a day (TID) | ORAL | Status: DC
  Administered 2024-08-16 – 2024-08-19 (×13): 10 mg via ORAL
  Filled 2024-08-16 (×13): qty 1

## 2024-08-16 NOTE — Plan of Care (Signed)

## 2024-08-16 NOTE — Plan of Care (Signed)
  Problem: Education: Goal: Knowledge of General Education information will improve Description: Including pain rating scale, medication(s)/side effects and non-pharmacologic comfort measures Outcome: Progressing   Problem: Clinical Measurements: Goal: Will remain free from infection Outcome: Progressing Goal: Diagnostic test results will improve Outcome: Progressing   Problem: Activity: Goal: Risk for activity intolerance will decrease Outcome: Progressing   Problem: Nutrition: Goal: Adequate nutrition will be maintained Outcome: Progressing   Problem: Coping: Goal: Level of anxiety will decrease Outcome: Progressing   Problem: Pain Managment: Goal: General experience of comfort will improve and/or be controlled Outcome: Progressing

## 2024-08-16 NOTE — Progress Notes (Signed)
 [facility]                                                                              Gaylene Moylan, is a 68 y.o. female, DOB - September 21, 1956, FMW:[id number] Admit date - 08/15/2024    Outpatient Primary MD for the patient is Jerome Heron Ruth, PA-C  LOS - 1  days  Chief Complaint  Patient presents with   Abdominal Pain       Brief summary   Patient is a 68 year old female with ulcerative colitis, recurrent diverticulitis, possible lupus, presented to ED with abdominal pain.  Patient was seen in ED on 10/25, had a CT of abdomen which was consistent with diverticulitis and was discharged on p.o. Augmentin .  Patient returned back to ED due to unrelenting abdominal pain and bloody stool x 1.  No fevers or chills.  Per patient, she can tell the difference between the pain of UC and diverticulitis. Patient was admitted for further workup.   Assessment & Plan     Acute recurrent diverticulitis -Continue IV fluids, pain control, antiemetics - Continue clear liquid diet today, advance slowly in a.m. - Continue IV Rocephin , Flagyl  - Per patient, she has a appointment with her GI at [facility] on November 11.  Recommended to discuss surgery referral given recurrent flares of diverticulitis. - Having abdominal cramps, placed on Bentyl  10 mg 3 times daily     Ulcerative colitis (HCC) -Currently stable, follow-up patient with her gastroenterologist at [facility]    HTN (hypertension), benign - Continue losartan, hydralazine  IV as needed with parameters    DM (diabetes mellitus), type 2 (HCC), diet controlled Hemoglobin A1c 5.9 - If CBGs trending up, will place on sliding scale insulin   Anxiety -Continue fluoxetine    Hyperlipidemia - Continue Zetia , Lipitor  Possible lupus = Diagnosed at [facility] many years ago, has been referred to rheumatology, manifests as recurrent rash on her face and joint pain  Obesity class I Estimated body mass  index is 36.06 kg/m as calculated from the following:   Height as of this encounter: 5' 5 (1.651 m).   Weight as of this encounter: 98.3 kg.  Code Status: Full code DVT Prophylaxis:  SCDs Start: 08/15/24 1645   Level of Care: Level of care: Med-Surg Family Communication: Updated patient Disposition Plan:      Remains inpatient appropriate:      Procedures:    Consultants:     Antimicrobials:   Anti-infectives (From admission, onward)    Start     Dose/Rate Route Frequency Ordered Stop   08/15/24 1800  metroNIDAZOLE  (FLAGYL ) IVPB 500 mg        500 mg 100 mL/hr over 60 Minutes Intravenous Every 12 hours 08/15/24 1653     08/15/24 1700  cefTRIAXone  (ROCEPHIN ) 2 g in sodium chloride  0.9 % 100 mL IVPB        2 g 200 mL/hr over 30 Minutes Intravenous Every 24 hours 08/15/24 1653            Medications  [START ON 08/18/2024] atorvastatin  20 mg Oral Daily   dicyclomine   10 mg Oral TID  AC & HS   ezetimibe   10 mg Oral Daily   FLUoxetine   20 mg Oral Daily   Influenza vac split trivalent PF  0.5 mL Intramuscular Tomorrow-1000   losartan  25 mg Oral Daily   pneumococcal 20-valent conjugate vaccine  0.5 mL Intramuscular Tomorrow-1000      Subjective:   Nykayla Marcelli was seen and examined today.  Having abdominal cramping, no acute nausea vomiting, chest pain or shortness of breath.  No fevers.   Objective:   Vitals:   08/15/24 2210 08/16/24 0201 08/16/24 0414 08/16/24 1019  BP: (!) 149/82 (!) 162/86 (!) 166/88 127/69  Pulse: 81 76 77 81  Resp: 16 15 16    Temp: 97.9 F (36.6 C) 97.8 F (36.6 C) (!) 97.5 F (36.4 C)   TempSrc:      SpO2: 98% 100% 100%   Weight:      Height:        Intake/Output Summary (Last 24 hours) at 08/16/2024 1038 Last data filed at 08/16/2024 0836 Gross per 24 hour  Intake 1681 ml  Output --  Net 1681 ml     Wt Readings from Last 3 Encounters:  08/15/24 98.3 kg  07/23/24 99.8 kg  05/23/24 102.1 kg     Exam General:  Alert and oriented x 3, NAD Cardiovascular: S1 S2 auscultated,  RRR Respiratory: Clear to auscultation bilaterally, no wheezing Gastrointestinal: Soft, tender in LLQ,  ND, NBS Ext: no pedal edema bilaterally Neuro: No new deficits Psych: Normal affect     Data Reviewed:  I have personally reviewed following labs    CBC Lab Results  Component Value Date   WBC 4.5 08/16/2024   RBC 4.79 08/16/2024   HGB 12.2 08/16/2024   HCT 41.3 08/16/2024   MCV 86.2 08/16/2024   MCH 25.5 (L) 08/16/2024   PLT 258 08/16/2024   MCHC 29.5 (L) 08/16/2024   RDW 15.3 08/16/2024   LYMPHSABS 2.7 07/23/2024   MONOABS 0.5 07/23/2024   EOSABS 0.1 07/23/2024   BASOSABS 0.0 07/23/2024     Last metabolic panel Lab Results  Component Value Date   NA 137 08/16/2024   K 4.0 08/16/2024   CL 105 08/16/2024   CO2 23 08/16/2024   BUN 7 (L) 08/16/2024   CREATININE 0.59 08/16/2024   GLUCOSE 89 08/16/2024   GFRNONAA >60 08/16/2024   GFRAA >60 05/09/2020   CALCIUM  8.7 (L) 08/16/2024   PHOS 3.0 03/31/2024   PROT 7.3 08/15/2024   ALBUMIN 3.9 08/15/2024   BILITOT 0.3 08/15/2024   ALKPHOS 71 08/15/2024   AST 15 08/15/2024   ALT 9 08/15/2024   ANIONGAP 9 08/16/2024    CBG (last 3)  No results for input(s): GLUCAP in the last 72 hours.    Coagulation Profile: No results for input(s): INR, PROTIME in the last 168 hours.   Radiology Studies: I have personally reviewed the imaging studies  CT ABDOMEN PELVIS W CONTRAST Result Date: 08/14/2024 CLINICAL DATA:  Left lower quadrant abdominal pain. Nausea and vomiting. EXAM: CT ABDOMEN AND PELVIS WITH CONTRAST TECHNIQUE: Multidetector CT imaging of the abdomen and pelvis was performed using the standard protocol following bolus administration of intravenous contrast. RADIATION DOSE REDUCTION: This exam was performed according to the departmental dose-optimization program which includes automated exposure control, adjustment of the mA and/or kV according  to patient size and/or use of iterative reconstruction technique. CONTRAST:  OMNIPAQUE  IOHEXOL  300 MG/ML  SOLN COMPARISON:  CT 07/23/2024, MRI 07/24/2024. Multiple prior exams reviewed.  FINDINGS: Lower chest: No basilar airspace disease or pleural effusion. Hepatobiliary: Redemonstrated intra and extrahepatic biliary ductal dilatation, without significant interval change. No focal liver lesion. Question of mild hepatic steatosis. Cholecystectomy. Pancreas: Again seen pancreatic ductal dilatation up to 9 mm proximally. No peripancreatic inflammation. Periampullary soft tissue fullness is similar to prior imaging, for example series 2, image 36, but no well-defined mass. Spleen: Normal in size without focal abnormality. Adrenals/Urinary Tract: Stable left adrenal myelolipoma. Normal right adrenal gland. No hydronephrosis or perinephric edema. Homogeneous renal enhancement with symmetric excretion on delayed phase imaging. Urinary bladder is partially distended without wall thickening. Streak artifact from left hip arthroplasty partially obscures detailed assessment. Stomach/Bowel: Unremarkable appearance of the stomach. The duodenum is decompressed and not well assessed. Periampullary soft tissue fullness is again seen, series 8, image 66. No small bowel obstruction or inflammatory change. A few fluid-filled loops of small bowel without wall thickening. High-riding cecum in the mid abdomen. The appendix is not definitively seen on the current exam. Majority of the colon is decompressed. Descending and sigmoid colonic diverticulosis. Mild wall thickening about the sigmoid in the region of multiple diverticula, series 12, image 70 7 May represent mild diverticulitis. No perforation or abscess. Vascular/Lymphatic: Normal caliber abdominal aorta. No abdominopelvic adenopathy. The portal and splenic veins are patent. Reproductive: Status post hysterectomy. No adnexal masses. Other: No ascites, free air, or focal  fluid collection. No abdominal wall hernia. Musculoskeletal: Left hip arthroplasty. Diffuse degenerative change in the spine. IMPRESSION: 1. Descending and sigmoid colonic diverticulosis. Mild wall thickening about the sigmoid in the region of multiple diverticula may represent mild diverticulitis. No perforation or abscess. 2. Unchanged intra and extrahepatic biliary ductal dilatation and pancreatic ductal dilatation. Periampullary soft tissue fullness is similar to prior imaging. Endoscopic evaluation is recommended. 3. Stable left adrenal myelolipoma. Electronically Signed   By: Andrea Gasman M.D.   On: 08/14/2024 15:02       Shianne Zeiser M.D. [facility] 08/16/2024, 10:38 AM  Available via Epic secure chat 7am-7pm After 7 pm, please refer to night coverage provider listed on amion.

## 2024-08-17 DIAGNOSIS — K5792 Diverticulitis of intestine, part unspecified, without perforation or abscess without bleeding: Secondary | ICD-10-CM | POA: Diagnosis not present

## 2024-08-17 DIAGNOSIS — I10 Essential (primary) hypertension: Secondary | ICD-10-CM | POA: Diagnosis not present

## 2024-08-17 DIAGNOSIS — E1100 Type 2 diabetes mellitus with hyperosmolarity without nonketotic hyperglycemic-hyperosmolar coma (NKHHC): Secondary | ICD-10-CM | POA: Diagnosis not present

## 2024-08-17 DIAGNOSIS — K5100 Ulcerative (chronic) pancolitis without complications: Secondary | ICD-10-CM | POA: Diagnosis not present

## 2024-08-17 LAB — RENAL FUNCTION PANEL
Albumin: 3.2 g/dL — ABNORMAL LOW (ref 3.5–5.0)
Anion gap: 9 (ref 5–15)
BUN: 5 mg/dL — ABNORMAL LOW (ref 8–23)
CO2: 23 mmol/L (ref 22–32)
Calcium: 8.8 mg/dL — ABNORMAL LOW (ref 8.9–10.3)
Chloride: 106 mmol/L (ref 98–111)
Creatinine, Ser: 0.58 mg/dL (ref 0.44–1.00)
GFR, Estimated: >60 mL/min (ref >60)
Glucose, Bld: 83 mg/dL (ref 70–99)
Phosphorus: 3.8 mg/dL (ref 2.5–4.6)
Potassium: 4.2 mmol/L (ref 3.5–5.1)
Sodium: 138 mmol/L (ref 135–145)

## 2024-08-17 LAB — CBC
HCT: 39.8 % (ref 36.0–46.0)
Hemoglobin: 11.7 g/dL — ABNORMAL LOW (ref 12.0–15.0)
MCH: 25.5 pg — ABNORMAL LOW (ref 26.0–34.0)
MCHC: 29.4 g/dL — ABNORMAL LOW (ref 30.0–36.0)
MCV: 86.9 fL (ref 80.0–100.0)
Platelets: 246 K/uL (ref 150–400)
RBC: 4.58 MIL/uL (ref 3.87–5.11)
RDW: 15.3 % (ref 11.5–15.5)
WBC: 4.4 K/uL (ref 4.0–10.5)
nRBC: 0 % (ref 0.0–0.2)

## 2024-08-17 NOTE — Plan of Care (Signed)

## 2024-08-17 NOTE — Plan of Care (Signed)

## 2024-08-17 NOTE — Progress Notes (Signed)
 Mobility Specialist Progress Note:   08/17/24 1413  Mobility  Activity Ambulated with assistance  Level of Assistance Standby assist, set-up cues, supervision of patient - no hands on  Assistive Device Other (Comment) (IV Pole)  Distance Ambulated (ft) 350 ft  Activity Response Tolerated well  Mobility Referral Yes  Mobility visit 1 Mobility  Mobility Specialist Start Time (ACUTE ONLY) 1355  Mobility Specialist Stop Time (ACUTE ONLY) 1408  Mobility Specialist Time Calculation (min) (ACUTE ONLY) 13 min   Pt was received in recliner and agreed to mobility. No complaints during ambulation. Returned to recliner with all needs met. Call bell in reach.  [employer] - Mobility Specialist

## 2024-08-17 NOTE — Progress Notes (Signed)
 [facility]                                                                              Erin Wolf, is a 68 y.o. female, DOB - 03-12-1956, FMW:[id number] Admit date - 08/15/2024    Outpatient Primary MD for the patient is Erin Heron Ruth, PA-C  LOS - 2  days  Chief Complaint  Patient presents with   Abdominal Pain       Brief summary   Patient is a 68 year old female with ulcerative colitis, recurrent diverticulitis, possible lupus, presented to ED with abdominal pain.  Patient was seen in ED on 10/25, had a CT of abdomen which was consistent with diverticulitis and was discharged on p.o. Augmentin .  Patient returned back to ED due to unrelenting abdominal pain and bloody stool x 1.  No fevers or chills.  Per patient, she can tell the difference between the pain of UC and diverticulitis. Patient was admitted for further workup.   Assessment & Plan     Acute recurrent diverticulitis -Continue IV fluids, pain control, antiemetics - Per patient, she has a appointment with her GI at [facility] on November 11.  Recommended to discuss surgery referral given recurrent flares of diverticulitis. - Abdominal cramps improving with Bentyl  10 mg 3 times daily - Continue IV Rocephin , Flagyl  - Diet advanced to soft solids today     Ulcerative colitis (HCC) -Currently stable, follow-up patient with her gastroenterologist at [facility]    HTN (hypertension), benign - Continue losartan, hydralazine  IV as needed with parameters    DM (diabetes mellitus), type 2 (HCC), diet controlled Hemoglobin A1c 5.9 - If CBGs trending up, will place on sliding scale insulin  - Fasting CBG 83 this morning  Anxiety -Continue fluoxetine    Hyperlipidemia - Continue Zetia , Lipitor  Possible lupus = Diagnosed at [facility] many years ago, has been referred to rheumatology, manifests as recurrent rash on her face and joint pain  Obesity class I Estimated  body mass index is 36.06 kg/m as calculated from the following:   Height as of this encounter: 5' 5 (1.651 m).   Weight as of this encounter: 98.3 kg.  Code Status: Full code DVT Prophylaxis:  SCDs Start: 08/15/24 1645   Level of Care: Level of care: Med-Surg Family Communication: Updated patient Disposition Plan:      Remains inpatient appropriate:   Hopefully DC home in a.m. if continues to improve   Procedures:    Consultants:     Antimicrobials:   Anti-infectives (From admission, onward)    Start     Dose/Rate Route Frequency Ordered Stop   08/15/24 1800  metroNIDAZOLE  (FLAGYL ) IVPB 500 mg        500 mg 100 mL/hr over 60 Minutes Intravenous Every 12 hours 08/15/24 1653     08/15/24 1700  cefTRIAXone  (ROCEPHIN ) 2 g in sodium chloride  0.9 % 100 mL IVPB        2 g 200 mL/hr over 30 Minutes Intravenous Every 24 hours 08/15/24 1653            Medications  [START ON 08/18/2024] atorvastatin  20 mg  Oral Daily   dicyclomine   10 mg Oral TID AC & HS   ezetimibe   10 mg Oral Daily   FLUoxetine   20 mg Oral Daily   losartan  25 mg Oral Daily      Subjective:   Erin Wolf was seen and examined today.  States abdominal cramps improving, would like to try soft solids today.  No fever chills nausea vomiting.  Objective:   Vitals:   08/16/24 1019 08/16/24 1240 08/16/24 1947 08/17/24 0459  BP: 127/69 (!) 140/80 123/65 (!) 146/84  Pulse: 81 74 82 84  Resp:  20 15 14   Temp:  98.5 F (36.9 C) 97.8 F (36.6 C) 98.6 F (37 C)  TempSrc:  Oral Oral Oral  SpO2:  100% 100% 98%  Weight:      Height:        Intake/Output Summary (Last 24 hours) at 08/17/2024 1334 Last data filed at 08/17/2024 0650 Gross per 24 hour  Intake 2265.89 ml  Output --  Net 2265.89 ml     Wt Readings from Last 3 Encounters:  08/15/24 98.3 kg  07/23/24 99.8 kg  05/23/24 102.1 kg    Physical Exam General: Alert and oriented x 3, NAD Cardiovascular: S1 S2 clear, RRR.  Respiratory:  CTAB, no wheezing, rales or rhonchi Gastrointestinal: Soft, LLQ TTP,  nondistended, NBS Ext: no pedal edema bilaterally Neuro: no new deficits Psych: Normal affect     Data Reviewed:  I have personally reviewed following labs    CBC Lab Results  Component Value Date   WBC 4.4 08/17/2024   RBC 4.58 08/17/2024   HGB 11.7 (L) 08/17/2024   HCT 39.8 08/17/2024   MCV 86.9 08/17/2024   MCH 25.5 (L) 08/17/2024   PLT 246 08/17/2024   MCHC 29.4 (L) 08/17/2024   RDW 15.3 08/17/2024   LYMPHSABS 2.7 07/23/2024   MONOABS 0.5 07/23/2024   EOSABS 0.1 07/23/2024   BASOSABS 0.0 07/23/2024     Last metabolic panel Lab Results  Component Value Date   NA 138 08/17/2024   K 4.2 08/17/2024   CL 106 08/17/2024   CO2 23 08/17/2024   BUN 5 (L) 08/17/2024   CREATININE 0.58 08/17/2024   GLUCOSE 83 08/17/2024   GFRNONAA >60 08/17/2024   GFRAA >60 05/09/2020   CALCIUM  8.8 (L) 08/17/2024   PHOS 3.8 08/17/2024   PROT 7.3 08/15/2024   ALBUMIN 3.2 (L) 08/17/2024   BILITOT 0.3 08/15/2024   ALKPHOS 71 08/15/2024   AST 15 08/15/2024   ALT 9 08/15/2024   ANIONGAP 9 08/17/2024    CBG (last 3)  No results for input(s): GLUCAP in the last 72 hours.    Coagulation Profile: No results for input(s): INR, PROTIME in the last 168 hours.   Radiology Studies: I have personally reviewed the imaging studies  No results found.      Nydia Distance M.D. [facility] 08/17/2024, 1:34 PM  Available via Epic secure chat 7am-7pm After 7 pm, please refer to night coverage provider listed on amion.

## 2024-08-18 DIAGNOSIS — K5792 Diverticulitis of intestine, part unspecified, without perforation or abscess without bleeding: Secondary | ICD-10-CM | POA: Diagnosis not present

## 2024-08-18 LAB — RENAL FUNCTION PANEL
Albumin: 3.3 g/dL — ABNORMAL LOW (ref 3.5–5.0)
Anion gap: 9 (ref 5–15)
BUN: 10 mg/dL (ref 8–23)
CO2: 23 mmol/L (ref 22–32)
Calcium: 8.8 mg/dL — ABNORMAL LOW (ref 8.9–10.3)
Chloride: 105 mmol/L (ref 98–111)
Creatinine, Ser: 0.57 mg/dL (ref 0.44–1.00)
GFR, Estimated: >60 mL/min (ref >60)
Glucose, Bld: 95 mg/dL (ref 70–99)
Phosphorus: 3.5 mg/dL (ref 2.5–4.6)
Potassium: 3.9 mmol/L (ref 3.5–5.1)
Sodium: 137 mmol/L (ref 135–145)

## 2024-08-18 LAB — CBC
HCT: 39 % (ref 36.0–46.0)
Hemoglobin: 11.8 g/dL — ABNORMAL LOW (ref 12.0–15.0)
MCH: 26.5 pg (ref 26.0–34.0)
MCHC: 30.3 g/dL (ref 30.0–36.0)
MCV: 87.6 fL (ref 80.0–100.0)
Platelets: 231 K/uL (ref 150–400)
RBC: 4.45 MIL/uL (ref 3.87–5.11)
RDW: 15.3 % (ref 11.5–15.5)
WBC: 5.4 K/uL (ref 4.0–10.5)
nRBC: 0 % (ref 0.0–0.2)

## 2024-08-18 MED ORDER — LACTATED RINGERS IV SOLN: INTRAVENOUS | Status: AC

## 2024-08-18 MED ORDER — HYDROMORPHONE HCL 1 MG/ML IJ SOLN
0.5000 mg | Freq: Once | INTRAMUSCULAR | Status: AC
  Administered 2024-08-18: 0.5 mg via INTRAVENOUS
  Filled 2024-08-18: qty 0.5

## 2024-08-18 NOTE — Plan of Care (Signed)

## 2024-08-18 NOTE — TOC Initial Note (Signed)
 Transition of Care [facility]) - Initial/Assessment Note    Patient Details  Name: Erin Wolf MRN: [id number] Date of Birth: 30-Sep-1956  Transition of Care [facility]) CM/SW Contact:    Doneta Glenys DASEN, RN Phone Number: 08/18/2024, 10:50 AM  Clinical Narrative:                 Presented for continued abdominal pain and nausea. PTA lives in a house with Koren (husband);PCP/insurance verified;DME-cane,walker;denies HH,oxygen, or SDOH needs;Koren will transport at discharge. No inpatient care needs identified during visit. Place consult if needs present.  Expected Discharge Plan: Home/Self Care Barriers to Discharge: Continued Medical Work up   Patient Goals and CMS Choice Patient states their goals for this hospitalization and ongoing recovery are:: Home with husband CMS Medicare.gov Compare Post Acute Care list provided to::  (NA) Choice offered to / list presented to : NA [place] ownership interest in [facility].provided to:: Parent NA    Expected Discharge Plan and Services In-house Referral: NA Discharge Planning Services: CM Consult   Living arrangements for the past 2 months: Single Family Home                 DME Arranged: N/A DME Agency: NA       HH Arranged: NA HH Agency: NA        Prior Living Arrangements/Services Living arrangements for the past 2 months: Single Family Home Lives with:: Spouse Patient language and need for interpreter reviewed:: Yes Do you feel safe going back to the place where you live?: Yes      Need for Family Participation in Patient Care: No (Comment) Care giver support system in place?: Yes (comment) Current home services: DME (cane, walker) Criminal Activity/Legal Involvement Pertinent to Current Situation/Hospitalization: No - Comment as needed  Activities of Daily Living   ADL Screening (condition at time of admission) Independently performs ADLs?: Yes (appropriate for developmental age) Is the patient deaf or have  difficulty hearing?: No Does the patient have difficulty seeing, even when wearing glasses/contacts?: No Does the patient have difficulty concentrating, remembering, or making decisions?: No  Permission Sought/Granted Permission sought to share information with : Case Manager Permission granted to share information with : Yes, Verbal Permission Granted  Share Information with NAME: Jernee, Murtaugh (Spouse)  [phone]           Emotional Assessment Appearance:: Appears stated age Attitude/Demeanor/Rapport: Engaged Affect (typically observed): Appropriate Orientation: : Oriented to Self, Oriented to Place, Oriented to  Time, Oriented to Situation Alcohol  / Substance Use: Not Applicable Psych Involvement: No (comment)  Admission diagnosis:  Diverticulitis [K57.92] Acute diverticulitis [K57.92] Patient Active Problem List   Diagnosis Date Noted   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   Colitis 05/23/2024   Class II obesity 04/28/2024   Anxiety 04/27/2024   Diverticulitis 03/31/2024   Acute diverticulitis 03/30/2024   Lower GI bleed 03/30/2024   DM (diabetes mellitus), type 2 (HCC) 03/30/2024   Abdominal pain, acute, left lower quadrant 11/19/2023   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   Abnormal laboratory test result 04/16/2022   Chronic migraine w/o aura w/o status migrainosus, not intractable 04/15/2022   Chronic insomnia 04/15/2022   S/P total knee arthroplasty, right 03/27/2021   Osteoarthritis of left knee 12/19/2020   Status post total left knee replacement 12/19/2020   Chronic abdominal pain 05/05/2020   Chronic pain of left knee    Chronic migraine 11/10/2018   Nausea & vomiting 10/18/2016   History of ulcerative colitis  LLQ abdominal pain 10/17/2016   Syncope and collapse 06/29/2016   Left-sided weakness 06/29/2016   Facial twitching 06/29/2016   IBS (irritable bowel syndrome) 06/29/2016   Faintness    Dehydration 05/09/2014   GERD (gastroesophageal reflux disease)  05/09/2014   Gastroenteritis 05/09/2014   Chest pain, atypical 05/09/2014   Chronic ulcerative colitis (HCC) 05/09/2014   Acute gastroenteritis 05/09/2014   Ulcerative colitis (HCC) 05/27/2013   Hypokalemia 05/27/2013   Anemia 01/04/2012   Transaminitis 01/04/2012   Diarrhea 01/04/2012   Chest pain 01/03/2012   SOB (shortness of breath) 01/03/2012   HTN (hypertension), benign 01/03/2012   Hx of migraines 01/03/2012   Seizure (HCC) 01/03/2012   Depression 01/03/2012   Ulcerative colitis (HCC) 01/03/2012   Chronic back pain 01/03/2012   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   Hyperlipidemia 01/03/2012   Hepatitis C 01/03/2012   Fibroids 01/03/2012   PCP:  Jerome Heron Ruth, PA-C Pharmacy:   [id number] [place], [place] - [address] AT [facility] & [address] [place] [address] Phone: [phone] Fax: [phone]     Social Drivers of Health (SDOH) Social History: SDOH Screenings   Food Insecurity: No Food Insecurity (08/15/2024)  Housing: Low Risk  (08/15/2024)  Transportation Needs: No Transportation Needs (08/15/2024)  Utilities: Not At Risk (08/15/2024)  Social Connections: Moderately Isolated (08/15/2024)  Tobacco Use: Low Risk  (08/15/2024)   SDOH Interventions:     Readmission Risk Interventions    08/18/2024   10:47 AM 04/28/2024    3:21 PM 04/01/2024   10:33 AM  Readmission Risk Prevention Plan  Transportation Screening Complete Complete Complete  PCP or Specialist Appt within 5-7 Days  Complete Complete  Home Care Screening  Complete Complete  Medication Review (RN CM)  Complete Complete  Medication Review (RN Care Manager) Complete    PCP or Specialist appointment within 3-5 days of discharge Complete    HRI or Home Care Consult Complete    SW Recovery Care/Counseling Consult Complete    Palliative Care Screening Not Applicable    Skilled Nursing Facility Not Applicable

## 2024-08-18 NOTE — Plan of Care (Signed)

## 2024-08-18 NOTE — Plan of Care (Addendum)
 pt called out for help, pt was in bathroom and pt nose was bleeding and with eyes was closed. pt responded to voice as soon as RN walked in and was confused on where she was but she reoriented quickly. Pt seemed quite weak as well, RN did a quick stroke scale, no one sided deficits noted, but definitely some weakness. RN was able to get her back to bed with a NT. Vitals are 97.3 F, 122/93 (Map 101), 94 HR, 100% SPO2 on RA .  Pt now reoriented and resting in bed.  Lue Elsie BROCKS, MD notified and aware

## 2024-08-18 NOTE — Progress Notes (Signed)
 PROGRESS NOTE    Erin Wolf  FMW:[id number] DOB: [birth year] DOA: 08/15/2024 PCP: Jerome Heron Ruth, PA-C   Brief Narrative:  Patient is a 68 year old female with ulcerative colitis, recurrent diverticulitis, possible lupus, presented to ED with abdominal pain.  Patient was seen in ED on 10/25, had a CT of abdomen which was consistent with diverticulitis and was discharged on p.o. Augmentin .  Patient returned back to ED due to unrelenting abdominal pain and bloody stool x 1.  No fevers or chills.  Hospitalist called for admission.  Assessment & Plan:   Principal Problem:   Acute diverticulitis Active Problems:   Ulcerative colitis (HCC)   HTN (hypertension), benign   DM (diabetes mellitus), type 2 (HCC)  Acute recurrent diverticulitis, POA -Continue IV fluids, pain control, antiemetics - Follow-up with GI at [facility] on November 11.  Recommended to discuss surgery referral given recurrent flares of diverticulitis. - Abdominal cramps improving with Bentyl  10 mg 3 times daily - Continue IV Rocephin , Flagyl  - Worsening symptoms over the past 24 hours with abdominal pain, continue soft diet  - Consider reimaging if worsening fever or white count or symptoms over the next 24 hours   Ulcerative colitis (HCC) -Currently stable, follow-up patient with her gastroenterologist at [facility] 11/11   HTN (hypertension), benign - Continue losartan, hydralazine  IV as needed with parameters   DM (diabetes mellitus), type 2 (HCC), diet controlled Hemoglobin A1c 5.9  -no indication for hypoglycemic protocol or sliding scale insulin    Anxiety -Continue fluoxetine  Hyperlipidemia - Continue Zetia , Lipitor Possible lupus = Diagnosed at [facility] many years ago, has been referred to rheumatology, manifests as recurrent rash on her face and joint pain   Obesity class I Body mass index is 36.06 kg/m.   DVT prophylaxis: SCDs Start: 08/15/24 1645   Code Status:    Code Status: Full Code  Family Communication: None present  Status is: Inpatient  Dispo: The patient is from: Home              Anticipated d/c is to: Home              Anticipated d/c date is: 24 to 48 hours              Patient currently not medically stable for discharge  Consultants:  None  Procedures:  None  Antimicrobials:  Ceftriaxone , Flagyl   Subjective: No acute issues or events overnight, reports worsening abdominal pain and poor appetite today, discussed reducing diet and focusing on liquids today.  Otherwise denies headache fever chills chest pain shortness of breath nausea vomiting or diarrhea  Objective: Vitals:   08/17/24 0459 08/17/24 1352 08/17/24 2016 08/18/24 0524  BP: (!) 146/84 122/87 133/69 (!) 144/84  Pulse: 84 99 96 92  Resp: 14 20 15 16   Temp: 98.6 F (37 C) 98 F (36.7 C) 98.1 F (36.7 C) 98.1 F (36.7 C)  TempSrc: Oral Oral Oral Oral  SpO2: 98% 98% 100% 96%  Weight:      Height:       No intake or output data in the 24 hours ending 08/18/24 0739 Filed Weights   08/15/24 1854  Weight: 98.3 kg    Examination:  General:  Pleasantly resting in bed, No acute distress. HEENT:  Normocephalic atraumatic.  Sclerae nonicteric, noninjected.  Extraocular movements intact bilaterally. Neck:  Without mass or deformity.  Trachea is midline. Lungs:  Clear to auscultate bilaterally without rhonchi, wheeze, or rales. Heart:  Regular rate  and rhythm.  Without murmurs, rubs, or gallops. Abdomen:  Soft, diffusely tender without PMI, nondistended.  Without guarding or rebound. Extremities: Without cyanosis, clubbing, edema, or obvious deformity. Skin:  Warm and dry, no erythema.  Data Reviewed: I have personally reviewed following labs and imaging studies  CBC: Recent Labs  Lab 08/14/24 1336 08/15/24 1353 08/16/24 0441 08/17/24 0423 08/18/24 0441  WBC 5.6 6.3 4.5 4.4 5.4  HGB 14.3 13.3 12.2 11.7* 11.8*  HCT 48.3* 43.1 41.3 39.8 39.0  MCV 85.2  84.5 86.2 86.9 87.6  PLT 301 285 258 246 231   Basic Metabolic Panel: Recent Labs  Lab 08/14/24 1336 08/15/24 1353 08/16/24 0441 08/17/24 0423 08/18/24 0441  NA 136 138 137 138 137  K 3.9 3.7 4.0 4.2 3.9  CL 104 105 105 106 105  CO2 20* 22 23 23 23   GLUCOSE 90 85 89 83 95  BUN 9 11 7* 5* 10  CREATININE 0.74 0.67 0.59 0.58 0.57  CALCIUM  9.4 9.1 8.7* 8.8* 8.8*  MG  --   --  2.0  --   --   PHOS  --   --   --  3.8 3.5   GFR: Estimated Creatinine Clearance: 78.1 mL/min (by C-G formula based on SCr of 0.57 mg/dL).  Liver Function Tests: Recent Labs  Lab 08/14/24 1336 08/15/24 1353 08/17/24 0423 08/18/24 0441  AST 20 15  --   --   ALT 11 9  --   --   ALKPHOS 78 71  --   --   BILITOT 0.4 0.3  --   --   PROT 8.2* 7.3  --   --   ALBUMIN 4.1 3.9 3.2* 3.3*   Recent Labs  Lab 08/14/24 1336  LIPASE 25   HbA1C: Recent Labs    08/16/24 0444  HGBA1C 5.9*   Thyroid  Function Tests: Recent Labs    08/16/24 0441  TSH 3.920   No results found for this or any previous visit (from the past 240 hours).   Radiology Studies: No results found.  Scheduled Meds:  atorvastatin  20 mg Oral Daily   dicyclomine   10 mg Oral TID AC & HS   ezetimibe   10 mg Oral Daily   FLUoxetine   20 mg Oral Daily   losartan  25 mg Oral Daily   Continuous Infusions:  cefTRIAXone  (ROCEPHIN )  IV 2 g (08/17/24 1700)   lactated ringers  50 mL/hr at 08/18/24 0308   metronidazole  500 mg (08/18/24 0503)     LOS: 3 days   Time spent:  Elsie JAYSON [place], DO [facility]  If 7PM-7AM, please contact night-coverage [email or web address]  08/18/2024, 7:39 AM

## 2024-08-19 DIAGNOSIS — K5792 Diverticulitis of intestine, part unspecified, without perforation or abscess without bleeding: Secondary | ICD-10-CM | POA: Diagnosis not present

## 2024-08-19 MED ORDER — METRONIDAZOLE 500 MG PO TABS: 500.0000 mg | ORAL_TABLET | Freq: Three times a day (TID) | ORAL | 0 refills | Status: AC

## 2024-08-19 MED ORDER — CEPHALEXIN 500 MG PO CAPS: 500.0000 mg | ORAL_CAPSULE | Freq: Four times a day (QID) | ORAL | 0 refills | Status: AC

## 2024-08-19 MED ORDER — OXYCODONE HCL 5 MG PO TABS: 5.0000 mg | ORAL_TABLET | Freq: Four times a day (QID) | ORAL | Status: DC | PRN

## 2024-08-19 MED ORDER — OXYCODONE HCL 5 MG PO TABS: 5.0000 mg | ORAL_TABLET | Freq: Four times a day (QID) | ORAL | 0 refills | Status: DC | PRN

## 2024-08-19 NOTE — Plan of Care (Signed)

## 2024-08-19 NOTE — Discharge Summary (Signed)
 Physician Discharge Summary  Erin Wolf FMW:[id number] DOB: Sep 07, 1956 DOA: 08/15/2024  PCP: Jerome Heron Ruth, PA-C  Admit date: 08/15/2024 Discharge date: 08/19/2024  Admitted From: Home Disposition: Home  Recommendations for Outpatient Follow-up:  Follow up with PCP in 1-2 weeks  Home Health: None Equipment/Devices: None  Discharge Condition: Stable CODE STATUS: Full Diet recommendation: Low-salt low-fat low-carb diet  Brief/Interim Summary: Patient is a 68 year old female with ulcerative colitis, recurrent diverticulitis, possible lupus, presented to ED with abdominal pain.  Patient was seen in ED on 10/25, had a CT of abdomen which was consistent with diverticulitis and was discharged on p.o. Augmentin .  Patient returned back to ED due to unrelenting abdominal pain and bloody stool x 1.  No fevers or chills.  Hospitalist called for admission.  Patient admitted as above with acute recurrent diverticulitis requiring IV narcotics and antiemetics for symptom management.  Over the past 48 hours patient's symptoms have improved quite drastically and now tolerating p.o. without further need for IV intervention.  Given resolution of symptoms she is otherwise stable and agreeable for discharge home.  Lengthy discussion at bedside in regards to her need for close follow-up given her history of ulcerative colitis with acute recurrent diverticulitis she may very well benefit from surgical and GI intervention in the near future.  Regardless patient is now back to baseline feels quite well and is otherwise stable for discharge home.  Continue medications as listed below.  Discharge Diagnoses:  Principal Problem:   Acute diverticulitis Active Problems:   Ulcerative colitis (HCC)   HTN (hypertension), benign   DM (diabetes mellitus), type 2 (HCC)  Acute recurrent diverticulitis, POA - Follow-up with GI at [facility] on November 11.  Recommended to discuss surgery referral  given recurrent flares of diverticulitis. - Abdominal cramps improving with Bentyl  10 mg 3 times daily - Continue Keflex  Flagyl  for 5 days to complete 10-day course   Ulcerative colitis (HCC) -Currently stable, follow-up patient with her gastroenterologist at [facility] 11/11   HTN (hypertension), benign - Continue losartan   DM (diabetes mellitus), type 2 (HCC), diet controlled Hemoglobin A1c 5.9  -no indication for further evaluation or treatment   Anxiety -Continue fluoxetine  Hyperlipidemia - Continue Zetia , Lipitor Possible lupus - Diagnosed at [facility] many years ago, has been referred to rheumatology, manifests as recurrent rash on her face and joint pain   Obesity class I Body mass index is 36.06 kg/m.  Discharge Instructions  Discharge Instructions     Call MD for:  difficulty breathing, headache or visual disturbances   Complete by: As directed    Call MD for:  extreme fatigue   Complete by: As directed    Call MD for:  hives   Complete by: As directed    Call MD for:  persistant dizziness or light-headedness   Complete by: As directed    Call MD for:  persistant nausea and vomiting   Complete by: As directed    Call MD for:  severe uncontrolled pain   Complete by: As directed    Call MD for:  temperature >100.4   Complete by: As directed    Diet - low sodium heart healthy   Complete by: As directed    Increase activity slowly   Complete by: As directed       Allergies as of 08/19/2024       Reactions   Celebrex [celecoxib] Nausea Only   Latex Dermatitis, Other (See Comments)   Skin peeling  Neurontin [gabapentin] Other (See Comments)   Abdominal pain   Nsaids Other (See Comments)   Colitis flares   Penicillins Hives, Other (See Comments)   Tolerates cefazolin , ceftriaxone , cephalexin , AND Augmentin    Wellbutrin [bupropion Hcl] Other (See Comments)   Insomnia Headaches    Bayer Aspirin  [aspirin ] Nausea Only, Other (See Comments)    Stomach pain   Darvocet [propoxyphene N-acetaminophen ] Nausea And Vomiting   Ketorolac  Tromethamine  Nausea And Vomiting   Tape Hives   Cipro  [ciprofloxacin  Hcl] Other (See Comments)   Abdominal pain   Fioricet [butalbital-apap-caffeine] Other (See Comments)   Abdominal pain   Lunesta [eszopiclone] Swelling   Other Diarrhea, Other (See Comments)   Spicy foods cause diarrhea due to colitis   Topamax [topiramate] Other (See Comments)   Abdominal pain   Wound Dressing Adhesive Hives        Medication List     STOP taking these medications    Ozempic (1 MG/DOSE) 4 MG/3ML Sopn Generic drug: Semaglutide (1 MG/DOSE)       TAKE these medications    acetaminophen  500 MG tablet Commonly known as: TYLENOL  Take 1,000 mg by mouth every 6 (six) hours as needed for headache, fever or mild pain (pain score 1-3).   atorvastatin 20 MG tablet Commonly known as: LIPITOR Take 20 mg by mouth daily.   Centrum Silver Women 50+ Tabs Take 1 tablet by mouth daily.   cephALEXin  500 MG capsule Commonly known as: KEFLEX  Take 1 capsule (500 mg total) by mouth 4 (four) times daily for 5 days.   dicyclomine  20 MG tablet Commonly known as: BENTYL  Take 1 tablet (20 mg total) by mouth 3 (three) times daily as needed (abdominal pain).   ezetimibe  10 MG tablet Commonly known as: ZETIA  Take 10 mg by mouth daily.   FLUoxetine  20 MG capsule Commonly known as: PROZAC  Take 20 mg by mouth daily.   hydrOXYzine  10 MG tablet Commonly known as: ATARAX  Take 1 tablet (10 mg total) by mouth 3 (three) times daily as needed for anxiety or itching.   Linzess  72 MCG capsule Generic drug: linaclotide  Take 72 mcg by mouth daily as needed (constipation).   losartan 25 MG tablet Commonly known as: COZAAR Take 25 mg by mouth daily.   MELATONIN PO Take 1 tablet by mouth at bedtime as needed (sleep).   metroNIDAZOLE  500 MG tablet Commonly known as: FLAGYL  Take 1 tablet (500 mg total) by mouth 3 (three)  times daily for 5 days.   oxyCODONE  5 MG immediate release tablet Commonly known as: Oxy IR/ROXICODONE  Take 1 tablet (5 mg total) by mouth every 6 (six) hours as needed for breakthrough pain (severe pain not relieved by tramadol). What changed:  medication strength how much to take when to take this reasons to take this   promethazine  12.5 MG tablet Commonly known as: PHENERGAN  Take 12.5 mg by mouth 3 (three) times daily as needed for nausea or vomiting.   REFRESH OP Place 1 drop into both eyes 2 (two) times daily.   traMADol 50 MG tablet Commonly known as: ULTRAM Take 50 mg by mouth every 6 (six) hours as needed (pain not relieived by Tylenol ).        Allergies  Allergen Reactions   Celebrex [Celecoxib] Nausea Only   Latex Dermatitis and Other (See Comments)    Skin peeling   Neurontin [Gabapentin] Other (See Comments)    Abdominal pain   Nsaids Other (See Comments)    Colitis flares  Penicillins Hives and Other (See Comments)    Tolerates cefazolin , ceftriaxone , cephalexin , AND Augmentin    Wellbutrin [Bupropion Hcl] Other (See Comments)    Insomnia Headaches    Bayer Aspirin  [Aspirin ] Nausea Only and Other (See Comments)    Stomach pain   Darvocet [Propoxyphene N-Acetaminophen ] Nausea And Vomiting   Ketorolac  Tromethamine  Nausea And Vomiting   Tape Hives   Cipro  [Ciprofloxacin  Hcl] Other (See Comments)    Abdominal pain   Fioricet [Butalbital-Apap-Caffeine] Other (See Comments)    Abdominal pain   Lunesta [Eszopiclone] Swelling   Other Diarrhea and Other (See Comments)    Spicy foods cause diarrhea due to colitis   Topamax [Topiramate] Other (See Comments)    Abdominal pain   Wound Dressing Adhesive Hives    Consultations: None   Procedures/Studies: CT ABDOMEN PELVIS W CONTRAST Result Date: 08/14/2024 CLINICAL DATA:  Left lower quadrant abdominal pain. Nausea and vomiting. EXAM: CT ABDOMEN AND PELVIS WITH CONTRAST TECHNIQUE: Multidetector CT imaging  of the abdomen and pelvis was performed using the standard protocol following bolus administration of intravenous contrast. RADIATION DOSE REDUCTION: This exam was performed according to the departmental dose-optimization program which includes automated exposure control, adjustment of the mA and/or kV according to patient size and/or use of iterative reconstruction technique. CONTRAST:  OMNIPAQUE  IOHEXOL  300 MG/ML  SOLN COMPARISON:  CT 07/23/2024, MRI 07/24/2024. Multiple prior exams reviewed. FINDINGS: Lower chest: No basilar airspace disease or pleural effusion. Hepatobiliary: Redemonstrated intra and extrahepatic biliary ductal dilatation, without significant interval change. No focal liver lesion. Question of mild hepatic steatosis. Cholecystectomy. Pancreas: Again seen pancreatic ductal dilatation up to 9 mm proximally. No peripancreatic inflammation. Periampullary soft tissue fullness is similar to prior imaging, for example series 2, image 36, but no well-defined mass. Spleen: Normal in size without focal abnormality. Adrenals/Urinary Tract: Stable left adrenal myelolipoma. Normal right adrenal gland. No hydronephrosis or perinephric edema. Homogeneous renal enhancement with symmetric excretion on delayed phase imaging. Urinary bladder is partially distended without wall thickening. Streak artifact from left hip arthroplasty partially obscures detailed assessment. Stomach/Bowel: Unremarkable appearance of the stomach. The duodenum is decompressed and not well assessed. Periampullary soft tissue fullness is again seen, series 8, image 66. No small bowel obstruction or inflammatory change. A few fluid-filled loops of small bowel without wall thickening. High-riding cecum in the mid abdomen. The appendix is not definitively seen on the current exam. Majority of the colon is decompressed. Descending and sigmoid colonic diverticulosis. Mild wall thickening about the sigmoid in the region of multiple  diverticula, series 12, image 70 7 May represent mild diverticulitis. No perforation or abscess. Vascular/Lymphatic: Normal caliber abdominal aorta. No abdominopelvic adenopathy. The portal and splenic veins are patent. Reproductive: Status post hysterectomy. No adnexal masses. Other: No ascites, free air, or focal fluid collection. No abdominal wall hernia. Musculoskeletal: Left hip arthroplasty. Diffuse degenerative change in the spine. IMPRESSION: 1. Descending and sigmoid colonic diverticulosis. Mild wall thickening about the sigmoid in the region of multiple diverticula may represent mild diverticulitis. No perforation or abscess. 2. Unchanged intra and extrahepatic biliary ductal dilatation and pancreatic ductal dilatation. Periampullary soft tissue fullness is similar to prior imaging. Endoscopic evaluation is recommended. 3. Stable left adrenal myelolipoma. Electronically Signed   By: Andrea Gasman M.D.   On: 08/14/2024 15:02   MR 3D Recon At Scanner Result Date: 07/25/2024 CLINICAL DATA:  Ampullary mass on prior imaging. EXAM: MRI ABDOMEN WITHOUT AND WITH CONTRAST (INCLUDING MRCP) TECHNIQUE: Multiplanar multisequence MR imaging of the abdomen  was performed both before and after the administration of intravenous contrast. Heavily T2-weighted images of the biliary and pancreatic ducts were obtained, and three-dimensional MRCP images were rendered by post processing. CONTRAST:  10mL GADAVIST  GADOBUTROL  1 MMOL/ML IV SOLN COMPARISON:  CT scan 07/23/2024 and 06/26/2024 FINDINGS: Lower chest: No acute findings Hepatobiliary: No suspicious focal abnormality within the liver parenchyma. No focal restricted diffusion in the liver parenchyma. Motion obscured focus of arterial phase hyperenhancement identified in the inferior right liver (see axial image 69 of series 15) measuring approximately 2.1 x 1.8 cm. No lesion visible at this location on precontrast imaging and no abnormality is seen in this region of the  liver on the recent contrast infused CT of 06/26/2024. Gallbladder surgically absent. Prominent intrahepatic biliary duct dilatation is associated with dilatation of the common duct up to 15 mm diameter in the porta hepatis and more modest dilatation of the common bile duct which measures up to 7 mm diameter in the head of the pancreas. MRCP imaging demonstrates no definite choledocholithiasis. Pancreas: Diffuse dilatation of the main pancreatic duct is similar to prior CT studies and measures up to 9 mm diameter in the head of the pancreas. No mass lesion is identified within the pancreatic parenchyma. Imaging of the ampullary region is motion degraded on postcontrast imaging in the duodenum is decompressed, hindering assessment of the ampulla, but as on prior CT imaging there does appear to be some soft tissue fullness in this region. Spleen:  No splenomegaly. No suspicious focal mass lesion. Adrenals/Urinary Tract: Right adrenal gland unremarkable. 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 Kidneys unremarkable. Stomach/Bowel: Stomach is unremarkable. No gastric wall thickening. No evidence of outlet obstruction. As noted above, postcontrast imaging through the region of the duodenum is nondiagnostic due to motion artifact. Axial T2 haste imaging suggests there may be some soft tissue thickening in the region of the ampulla. No small bowel or colonic dilatation within the visualized abdomen. Vascular/Lymphatic: No abdominal aortic aneurysm. No abdominal lymphadenopathy. Other:  No intraperitoneal free fluid. Musculoskeletal: No focal suspicious marrow enhancement within the visualized bony anatomy. IMPRESSION: 1. Motion degraded study. 2. Diffuse dilatation of the main pancreatic duct is similar to prior CT studies and measures up to 9 mm diameter in the head of the pancreas. No mass lesion is identified within the pancreatic parenchyma. Imaging of the ampullary region is motion degraded on  postcontrast imaging and the duodenum is decompressed, hindering assessment of the ampulla, but as on prior CT imaging there does appear to be some soft tissue fullness in this region. Upper endoscopy recommended for definitive characterization. 3. Prominent intrahepatic biliary duct dilatation is associated with dilatation of the common duct up to 15 mm diameter in the porta hepatis and more modest dilatation of the common bile duct which measures up to 7 mm diameter in the head of the pancreas. No definite choledocholithiasis. Again, soft tissue fullness noted in the region of the ampulla. 4. Motion obscured focus of arterial phase hyperenhancement in the inferior right liver measuring approximately 2.1 x 1.8 cm. No lesion visible at this location on precontrast imaging and no abnormality is seen in this region of the liver on the recent contrast infused CT of 06/26/2024. This is most likely related to benign etiology such is vascular malformation/vascular shunting. Consider follow-up MRI in 3-6 months to ensure stability. Electronically Signed   By: Camellia Candle M.D.   On: 07/25/2024 05:45   MR ABDOMEN MRCP W WO  CONTAST Result Date: 07/25/2024 CLINICAL DATA:  Ampullary mass on prior imaging. EXAM: MRI ABDOMEN WITHOUT AND WITH CONTRAST (INCLUDING MRCP) TECHNIQUE: Multiplanar multisequence MR imaging of the abdomen was performed both before and after the administration of intravenous contrast. Heavily T2-weighted images of the biliary and pancreatic ducts were obtained, and three-dimensional MRCP images were rendered by post processing. CONTRAST:  10mL GADAVIST  GADOBUTROL  1 MMOL/ML IV SOLN COMPARISON:  CT scan 07/23/2024 and 06/26/2024 FINDINGS: Lower chest: No acute findings Hepatobiliary: No suspicious focal abnormality within the liver parenchyma. No focal restricted diffusion in the liver parenchyma. Motion obscured focus of arterial phase hyperenhancement identified in the inferior right liver (see axial  image 69 of series 15) measuring approximately 2.1 x 1.8 cm. No lesion visible at this location on precontrast imaging and no abnormality is seen in this region of the liver on the recent contrast infused CT of 06/26/2024. Gallbladder surgically absent. Prominent intrahepatic biliary duct dilatation is associated with dilatation of the common duct up to 15 mm diameter in the porta hepatis and more modest dilatation of the common bile duct which measures up to 7 mm diameter in the head of the pancreas. MRCP imaging demonstrates no definite choledocholithiasis. Pancreas: Diffuse dilatation of the main pancreatic duct is similar to prior CT studies and measures up to 9 mm diameter in the head of the pancreas. No mass lesion is identified within the pancreatic parenchyma. Imaging of the ampullary region is motion degraded on postcontrast imaging in the duodenum is decompressed, hindering assessment of the ampulla, but as on prior CT imaging there does appear to be some soft tissue fullness in this region. Spleen:  No splenomegaly. No suspicious focal mass lesion. Adrenals/Urinary Tract: Right adrenal gland unremarkable. 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 Kidneys unremarkable. Stomach/Bowel: Stomach is unremarkable. No gastric wall thickening. No evidence of outlet obstruction. As noted above, postcontrast imaging through the region of the duodenum is nondiagnostic due to motion artifact. Axial T2 haste imaging suggests there may be some soft tissue thickening in the region of the ampulla. No small bowel or colonic dilatation within the visualized abdomen. Vascular/Lymphatic: No abdominal aortic aneurysm. No abdominal lymphadenopathy. Other:  No intraperitoneal free fluid. Musculoskeletal: No focal suspicious marrow enhancement within the visualized bony anatomy. IMPRESSION: 1. Motion degraded study. 2. Diffuse dilatation of the main pancreatic duct is similar to prior CT studies and  measures up to 9 mm diameter in the head of the pancreas. No mass lesion is identified within the pancreatic parenchyma. Imaging of the ampullary region is motion degraded on postcontrast imaging and the duodenum is decompressed, hindering assessment of the ampulla, but as on prior CT imaging there does appear to be some soft tissue fullness in this region. Upper endoscopy recommended for definitive characterization. 3. Prominent intrahepatic biliary duct dilatation is associated with dilatation of the common duct up to 15 mm diameter in the porta hepatis and more modest dilatation of the common bile duct which measures up to 7 mm diameter in the head of the pancreas. No definite choledocholithiasis. Again, soft tissue fullness noted in the region of the ampulla. 4. Motion obscured focus of arterial phase hyperenhancement in the inferior right liver measuring approximately 2.1 x 1.8 cm. No lesion visible at this location on precontrast imaging and no abnormality is seen in this region of the liver on the recent contrast infused CT of 06/26/2024. This is most likely related to benign etiology such is vascular malformation/vascular shunting. Consider  follow-up MRI in 3-6 months to ensure stability. Electronically Signed   By: Camellia Candle M.D.   On: 07/25/2024 05:45   CT ABDOMEN PELVIS WO CONTRAST Result Date: 07/23/2024 CLINICAL DATA:  Acute lower abdominal pain. EXAM: CT ABDOMEN AND PELVIS WITHOUT CONTRAST TECHNIQUE: Multidetector CT imaging of the abdomen and pelvis was performed following the standard protocol without IV contrast. RADIATION DOSE REDUCTION: This exam was performed according to the departmental dose-optimization program which includes automated exposure control, adjustment of the mA and/or kV according to patient size and/or use of iterative reconstruction technique. COMPARISON:  June 26, 2024. FINDINGS: Lower chest: No acute abnormality. Hepatobiliary: No focal liver abnormality is seen.  Status post cholecystectomy. Mildly dilated common bile duct is noted with minimal intrahepatic biliary dilatation. Pancreas: Unremarkable. No pancreatic ductal dilatation or surrounding inflammatory changes. Spleen: Normal in size without focal abnormality. Adrenals/Urinary Tract: Stable left adrenal myelolipoma. Kidneys are normal, without renal calculi, focal lesion, or hydronephrosis. Bladder is unremarkable. Stomach/Bowel: The stomach is unremarkable. Diverticulosis of descending and sigmoid colon is noted without definite inflammation. The appendix is not clearly visualized. There is no evidence of bowel obstruction. There is again noted solid abnormality in the region of ampulla in second portion of duodenum concerning for possible mass as described on prior CT scan. Further evaluation with MRI is recommended. Vascular/Lymphatic: No significant vascular findings are present. No enlarged abdominal or pelvic lymph nodes. Reproductive: Status post hysterectomy. No adnexal masses. Other: No abdominal wall hernia or abnormality. No abdominopelvic ascites. Musculoskeletal: Status post left total hip arthroplasty. No acute osseous abnormality. IMPRESSION: 1. There is again noted solid abnormality in the region of ampulla in second portion of duodenum concerning for possible mass as described on prior CT scan. Further evaluation with MRI is recommended. 2. Stable left adrenal myelolipoma. 3. Diverticulosis of descending and sigmoid colon without definite inflammation. Electronically Signed   By: Lynwood Landy Raddle M.D.   On: 07/23/2024 11:37     Subjective: No acute issues or events overnight denies any further episodes of nausea vomiting diarrhea or abdominal pain   Discharge Exam: Vitals:   08/18/24 2008 08/19/24 0606  BP: (!) 148/81 (!) 159/87  Pulse: (!) 101 96  Resp: 19   Temp: 98 F (36.7 C) 98 F (36.7 C)  SpO2: 99% 96%   Vitals:   08/18/24 1137 08/18/24 1314 08/18/24 2008 08/19/24 0606  BP:  124/75 135/74 (!) 148/81 (!) 159/87  Pulse: 84 96 (!) 101 96  Resp:  16 19   Temp:  98.3 F (36.8 C) 98 F (36.7 C) 98 F (36.7 C)  TempSrc:  Oral    SpO2: 98% 100% 99% 96%  Weight:      Height:        General: Pt is alert, awake, not in acute distress Cardiovascular: RRR, S1/S2 +, no rubs, no gallops Respiratory: CTA bilaterally, no wheezing, no rhonchi Abdominal: Soft, NT, ND, bowel sounds + Extremities: no edema, no cyanosis    The results of significant diagnostics from this hospitalization (including imaging, microbiology, ancillary and laboratory) are listed below for reference.     Microbiology: No results found for this or any previous visit (from the past 240 hours).   Labs: BNP (last 3 results) No results for input(s): BNP in the last 8760 hours. Basic Metabolic Panel: Recent Labs  Lab 08/14/24 1336 08/15/24 1353 08/16/24 0441 08/17/24 0423 08/18/24 0441  NA 136 138 137 138 137  K 3.9 3.7 4.0 4.2 3.9  CL 104 105 105 106 105  CO2 20* 22 23 23 23   GLUCOSE 90 85 89 83 95  BUN 9 11 7* 5* 10  CREATININE 0.74 0.67 0.59 0.58 0.57  CALCIUM  9.4 9.1 8.7* 8.8* 8.8*  MG  --   --  2.0  --   --   PHOS  --   --   --  3.8 3.5   Liver Function Tests: Recent Labs  Lab 08/14/24 1336 08/15/24 1353 08/17/24 0423 08/18/24 0441  AST 20 15  --   --   ALT 11 9  --   --   ALKPHOS 78 71  --   --   BILITOT 0.4 0.3  --   --   PROT 8.2* 7.3  --   --   ALBUMIN 4.1 3.9 3.2* 3.3*   Recent Labs  Lab 08/14/24 1336  LIPASE 25   No results for input(s): AMMONIA in the last 168 hours. CBC: Recent Labs  Lab 08/14/24 1336 08/15/24 1353 08/16/24 0441 08/17/24 0423 08/18/24 0441  WBC 5.6 6.3 4.5 4.4 5.4  HGB 14.3 13.3 12.2 11.7* 11.8*  HCT 48.3* 43.1 41.3 39.8 39.0  MCV 85.2 84.5 86.2 86.9 87.6  PLT 301 285 258 246 231   Cardiac Enzymes: No results for input(s): CKTOTAL, CKMB, CKMBINDEX, TROPONINI in the last 168 hours. BNP: Invalid input(s):  POCBNP CBG: No results for input(s): GLUCAP in the last 168 hours. D-Dimer No results for input(s): DDIMER in the last 72 hours. Hgb A1c No results for input(s): HGBA1C in the last 72 hours. Lipid Profile No results for input(s): CHOL, HDL, LDLCALC, TRIG, CHOLHDL, LDLDIRECT in the last 72 hours. Thyroid  function studies No results for input(s): TSH, T4TOTAL, T3FREE, THYROIDAB in the last 72 hours.  Invalid input(s): FREET3 Anemia work up No results for input(s): VITAMINB12, FOLATE, FERRITIN, TIBC, IRON, RETICCTPCT in the last 72 hours. Urinalysis    Component Value Date/Time   COLORURINE YELLOW 08/14/2024 1241   APPEARANCEUR HAZY (A) 08/14/2024 1241   LABSPEC 1.025 08/14/2024 1241   PHURINE 5.0 08/14/2024 1241   GLUCOSEU NEGATIVE 08/14/2024 1241   HGBUR NEGATIVE 08/14/2024 1241   BILIRUBINUR NEGATIVE 08/14/2024 1241   KETONESUR NEGATIVE 08/14/2024 1241   PROTEINUR NEGATIVE 08/14/2024 1241   UROBILINOGEN 0.2 04/17/2015 0644   NITRITE NEGATIVE 08/14/2024 1241   LEUKOCYTESUR TRACE (A) 08/14/2024 1241   Sepsis Labs Recent Labs  Lab 08/15/24 1353 08/16/24 0441 08/17/24 0423 08/18/24 0441  WBC 6.3 4.5 4.4 5.4   Microbiology No results found for this or any previous visit (from the past 240 hours).   Time coordinating discharge: Over 30 minutes  SIGNED:   Elsie JAYSON Montclair, DO [facility] 08/19/2024, 3:12 PM Pager   If 7PM-7AM, please contact night-coverage [email or web address]

## 2024-09-12 ENCOUNTER — Ambulatory Visit: Admission: EM | Admit: 2024-09-12 | Discharge: 2024-09-12 | Disposition: A | Discharge status: Home / Self Care

## 2024-09-12 DIAGNOSIS — F419 Anxiety disorder, unspecified: Secondary | ICD-10-CM | POA: Insufficient documentation

## 2024-09-12 DIAGNOSIS — R7689 Other specified abnormal immunological findings in serum: Secondary | ICD-10-CM | POA: Insufficient documentation

## 2024-09-12 DIAGNOSIS — R5383 Other fatigue: Secondary | ICD-10-CM | POA: Insufficient documentation

## 2024-09-12 DIAGNOSIS — E669 Obesity, unspecified: Secondary | ICD-10-CM | POA: Insufficient documentation

## 2024-09-12 DIAGNOSIS — I10 Essential (primary) hypertension: Secondary | ICD-10-CM | POA: Insufficient documentation

## 2024-09-12 DIAGNOSIS — K0889 Other specified disorders of teeth and supporting structures: Secondary | ICD-10-CM | POA: Diagnosis not present

## 2024-09-12 DIAGNOSIS — E119 Type 2 diabetes mellitus without complications: Secondary | ICD-10-CM | POA: Insufficient documentation

## 2024-09-12 DIAGNOSIS — M25569 Pain in unspecified knee: Secondary | ICD-10-CM | POA: Insufficient documentation

## 2024-09-12 DIAGNOSIS — G4700 Insomnia, unspecified: Secondary | ICD-10-CM | POA: Insufficient documentation

## 2024-09-12 DIAGNOSIS — G2581 Restless legs syndrome: Secondary | ICD-10-CM | POA: Insufficient documentation

## 2024-09-12 DIAGNOSIS — G40909 Epilepsy, unspecified, not intractable, without status epilepticus: Secondary | ICD-10-CM | POA: Insufficient documentation

## 2024-09-12 DIAGNOSIS — F410 Panic disorder [episodic paroxysmal anxiety] without agoraphobia: Secondary | ICD-10-CM | POA: Insufficient documentation

## 2024-09-12 DIAGNOSIS — G894 Chronic pain syndrome: Secondary | ICD-10-CM | POA: Insufficient documentation

## 2024-09-12 DIAGNOSIS — R55 Syncope and collapse: Secondary | ICD-10-CM | POA: Insufficient documentation

## 2024-09-12 DIAGNOSIS — K047 Periapical abscess without sinus: Secondary | ICD-10-CM | POA: Diagnosis not present

## 2024-09-12 DIAGNOSIS — M1990 Unspecified osteoarthritis, unspecified site: Secondary | ICD-10-CM | POA: Insufficient documentation

## 2024-09-12 DIAGNOSIS — G43709 Chronic migraine without aura, not intractable, without status migrainosus: Secondary | ICD-10-CM | POA: Insufficient documentation

## 2024-09-12 MED ORDER — AMOXICILLIN-POT CLAVULANATE 875-125 MG PO TABS: 1.0000 | ORAL_TABLET | Freq: Two times a day (BID) | ORAL | 0 refills | Status: AC

## 2024-09-12 MED ORDER — HYDROCODONE-ACETAMINOPHEN 5-325 MG PO TABS: 1.0000 | ORAL_TABLET | Freq: Four times a day (QID) | ORAL | 0 refills | Status: DC | PRN

## 2024-09-12 NOTE — ED Triage Notes (Signed)
 Patient reports having dental pain due to tooth being broken in right lower side (back). Using Motrin/Tylenol  and nothing has helped. She has made a dental appointment for 09-17-2024. No fever known.

## 2024-09-12 NOTE — Discharge Instructions (Addendum)
 Dental pain and likely dental infection secondary to fractured right lower molar.  We will treat this with antibiotics by mouth as well as medication for pain.  Keep appointment as scheduled with your dentist.  We will treat with the following: Augmentin  875/125 mg twice daily for 7 days.  This is an antibiotic.  Take this with food.  You have had this medication in the past. Hydrocodone /acetaminophen  5/325 mg 1 to 2 tablets every 6 hours as needed for severe pain.  Do not take Tylenol  containing products while you are taking this medication.  Do not take oxycodone  while you are taking this medication as this can cause significant sedation and possible respiratory distress For mild to moderate pain you can use ibuprofen 600 to 800 mg every 6 hours as needed May use cool compresses on the right lower drawl to help with pain and swelling Keep follow-up as scheduled with the dentist Return to urgent care or PCP if symptoms worsen or fail to resolve.

## 2024-09-12 NOTE — ED Provider Notes (Signed)
 [name]    CSN: [id number] Arrival date & time: 09/12/24  1035      History   Chief Complaint Chief Complaint  Patient presents with   Dental Pain    HPI Erin Wolf is a 68 y.o. female.   68 year old female presents urgent care with complaints of severe dental pain from a fractured tooth on the right lower jaw.  This has been going on for several days now.  She has been taking Tylenol  and ibuprofen without relief.  She has a dentist appointment on the 28th but cannot wait that long due to the pain.  She has oxycodone  at home but only takes this as needed for diverticulitis type pain and she avoids taking this medication as it actually does cause gastrointestinal discomfort.  She denies any fevers or chills.   Dental Pain Associated symptoms: no fever     Past Medical History:  Diagnosis Date   Anxiety    Arthritis    Chronic abdominal pain    Chronic lower back pain    Chronic nausea    Depression    Diabetes mellitus without complication (HCC)    not on meds   Diverticulosis    Dyspnea    with exertion   Fibroids 01/03/2012   Frequency of urination    GERD (gastroesophageal reflux disease)    Headache    Hematuria    Hepatitis    pt denies   Hepatitis C antibody positive in blood    per pt told by pcp   History of panic attacks    History of syncope    hx recurrent syncope --- per epic domentation non-cardiac , orthostatic hypotension, anxiety, dehydration   History of uterine fibroid    HTN (hypertension), benign    Hyperlipidemia 01/03/2012   IBS (irritable bowel syndrome)    Myalgia    Sciatic nerve disease, left 2023   Seizure (HCC) 01/03/2012   Seizure disorder (HCC) followed by pcp until new neurologist appr. in jan 2019   started having them in my 20's ,  petit mal ----  per pt last seizure one 2015   SLE (systemic lupus erythematosus) (HCC)    rheumatologist-- dr thamas (consult 11-12-2016) having work-up done    Ulcerative colitis    followed by dr lennard at [facility]   Wears glasses     Patient Active Problem List   Diagnosis Date Noted   Knee pain 09/12/2024   Fatigue 09/12/2024   Hepatitis C antibody detected 09/12/2024   Osteoarthritis 09/12/2024   Panic disorder 09/12/2024   Restless legs syndrome 09/12/2024   Anxiety 09/12/2024   Insomnia 09/12/2024   Chronic pain syndrome 09/12/2024   Chronic migraine without aura 09/12/2024   Obesity 09/12/2024   Type 2 diabetes mellitus (HCC) 09/12/2024   Seizure disorder (HCC) 09/12/2024   Syncope 09/12/2024   Hypertensive disorder 09/12/2024   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   Colitis 05/23/2024   Class II obesity 04/28/2024   Anxiety 04/27/2024   Diverticulitis 03/31/2024   Acute diverticulitis 03/30/2024   Lower GI bleed 03/30/2024   DM (diabetes mellitus), type 2 (HCC) 03/30/2024   Abdominal pain, acute, left lower quadrant 11/19/2023   Lumbar spondylosis 03/03/2023   Pain of lumbar spine 03/03/2023   Spine pain, lumbar 03/03/2023   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   Osteoarthritis of left hip 10/31/2022   Pain of left hip joint 10/24/2022   Arthralgia of left knee 09/01/2022   Abnormal laboratory  test result 04/16/2022   Chronic migraine w/o aura w/o status migrainosus, not intractable 04/15/2022   Chronic insomnia 04/15/2022   Arthralgia of right knee 04/02/2021   S/P total knee arthroplasty, right 03/27/2021   Status post total left knee replacement 12/19/2020   Chronic abdominal pain 05/05/2020   Chronic pain of left knee    Osteoarthritis of left knee 04/08/2019   Pain in left knee 02/16/2019   Chronic migraine 11/10/2018   Transformed migraine 11/10/2018   Drug therapy 11/12/2016   Inflammatory arthritis 11/12/2016   Myalgia 11/12/2016   Pain in joint involving multiple sites 11/12/2016   Nausea and vomiting 10/18/2016   History of ulcerative colitis    Left lower quadrant pain 10/17/2016   Syncope and collapse  06/29/2016   Left hemiparesis (HCC) 06/29/2016   Facial twitching 06/29/2016   Irritable bowel syndrome 06/29/2016   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   Faintness    Dehydration 05/09/2014   GERD (gastroesophageal reflux disease) 05/09/2014   Gastroenteritis 05/09/2014   Chest pain, atypical 05/09/2014   Chronic ulcerative colitis (HCC) 05/09/2014   Acute gastroenteritis 05/09/2014   Atypical chest pain 05/09/2014   Gastroesophageal reflux disease 05/09/2014   Ulcerative colitis (HCC) 05/27/2013   Hypokalemia 05/27/2013   Anemia 01/04/2012   Transaminitis 01/04/2012   Diarrhea 01/04/2012   Enzyme disorder 01/04/2012   Chest pain 01/03/2012   Dyspnea 01/03/2012   HTN (hypertension), benign 01/03/2012   Hx of migraines 01/03/2012   Depression 01/03/2012   Ulcerative colitis (HCC) 01/03/2012   Chronic back pain 01/03/2012   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   Hyperlipidemia 01/03/2012   Hepatitis C 01/03/2012   Depressive disorder 01/03/2012   Dysthymia 01/03/2012   Leiomyoma 01/03/2012   Viral hepatitis C 01/03/2012   Benign hypertension 01/03/2012   History of migraine 01/03/2012    Past Surgical History:  Procedure Laterality Date   ABDOMINAL HYSTERECTOMY  1988   ARTERY BIOPSY Left 06/11/2022   Procedure: LEFT BIOPSY TEMPORAL ARTERY;  Surgeon: Stevie Herlene Righter, MD;  Location: [facility];  Service: General;  Laterality: Left;   CHOLECYSTECTOMY OPEN  1978   COLONOSCOPY N/A 05/31/2013   Procedure: COLONOSCOPY;  Surgeon: Lesta JULIANNA Fitz, MD;  Location: [facility];  Service: Endoscopy;  Laterality: N/A;   CYSTOSCOPY/RETROGRADE/URETEROSCOPY Bilateral 07/23/2017   Procedure: CYSTOSCOPY/RETROGRADE/URETEROSCOPY;  Surgeon: Devere Lonni Righter, MD;  Location: [facility];  Service: Urology;  Laterality: Bilateral;   detached retina on right eye surgery      ESOPHAGOGASTRODUODENOSCOPY (EGD) WITH PROPOFOL  N/A 05/11/2014   Procedure: ESOPHAGOGASTRODUODENOSCOPY (EGD)  WITH PROPOFOL ;  Surgeon: Jerrell KYM Sol, MD;  Location: [facility];  Service: Endoscopy;  Laterality: N/A;  egd first   ESOPHAGOGASTRODUODENOSCOPY (EGD) WITH PROPOFOL  N/A 05/08/2020   Procedure: ESOPHAGOGASTRODUODENOSCOPY (EGD) WITH PROPOFOL ;  Surgeon: Burnette Fallow, MD;  Location: [facility];  Service: Endoscopy;  Laterality: N/A;   EUS N/A 06/21/2016   Procedure: ESOPHAGEAL ENDOSCOPIC ULTRASOUND (EUS) RADIAL;  Surgeon: Fallow Burnette, MD;  Location: [facility];  Service: Endoscopy;  Laterality: N/A;   FLEXIBLE SIGMOIDOSCOPY N/A 05/11/2014   Procedure: FLEXIBLE SIGMOIDOSCOPY;  Surgeon: Jerrell KYM Sol, MD;  Location: [facility];  Service: Endoscopy;  Laterality: N/A;   LAPAROTOMY W/ BILATERAL SALPINGOOPHORECTOMY  09-26-2003   dr johnnye at [facility]   LAPROSCOPY W/ LYSIS ADHESIONS  06-08-2003   dr leva [facility]   TOTAL HIP ARTHROPLASTY Left 12/03/2022   Procedure: TOTAL HIP ARTHROPLASTY ANTERIOR APPROACH;  Surgeon: Ernie Cough, MD;  Location: [facility];  Service: Orthopedics;  Laterality: Left;   TOTAL KNEE ARTHROPLASTY Left 12/19/2020   Procedure: TOTAL KNEE ARTHROPLASTY;  Surgeon: Ernie Cough, MD;  Location: [facility];  Service: Orthopedics;  Laterality: Left;  70 mins   TOTAL KNEE ARTHROPLASTY Right 03/27/2021   Procedure: TOTAL KNEE ARTHROPLASTY;  Surgeon: Ernie Cough, MD;  Location: [facility];  Service: Orthopedics;  Laterality: Right;  70 mins   TRANSTHORACIC ECHOCARDIOGRAM  04/29/2016   ef 60-65%,  grade 2 diastolic dysfunction/  trivial MR and TR   TUBAL LIGATION Bilateral yrs ago   UPPER ESOPHAGEAL ENDOSCOPIC ULTRASOUND (EUS) N/A 05/08/2020   Procedure: UPPER ESOPHAGEAL ENDOSCOPIC ULTRASOUND (EUS);  Surgeon: Burnette Fallow, MD;  Location: [facility];  Service: Endoscopy;  Laterality: N/A;    OB History   No obstetric history on file.      Home Medications    Prior to Admission medications   Medication Sig Start Date End Date Taking? Authorizing Provider  acetaminophen   (TYLENOL ) 500 MG tablet Take 1,000 mg by mouth every 6 (six) hours as needed for headache, fever or mild pain (pain score 1-3).   Yes [provider]  amoxicillin -clavulanate (AUGMENTIN ) 875-125 MG tablet Take 1 tablet by mouth every 12 (twelve) hours. 09/12/24  Yes Gaither Biehn A, PA-C  HYDROcodone -acetaminophen  (NORCO/VICODIN) 5-325 MG tablet Take 1-2 tablets by mouth every 6 (six) hours as needed for severe pain (pain score 7-10). 09/12/24  Yes Winefred Hillesheim A, PA-C  ibuprofen (ADVIL) 200 MG tablet Take 200 mg by mouth every 6 (six) hours as needed.   Yes [provider]  Oxycodone  HCl 20 MG TABS Take 1 tablet by mouth 4 (four) times daily as needed. 08/24/24  Yes [provider]  atorvastatin  (LIPITOR) 20 MG tablet Take 20 mg by mouth daily.    [provider]  dicyclomine  (BENTYL ) 20 MG tablet Take 1 tablet (20 mg total) by mouth 3 (three) times daily as needed (abdominal pain). Patient not taking: Reported on 08/15/2024 07/25/24   Vann, Jessica U, DO  ezetimibe  (ZETIA ) 10 MG tablet Take 10 mg by mouth daily. 04/07/24   [provider]  FLUoxetine  (PROZAC ) 20 MG capsule Take 20 mg by mouth daily.    [provider]  hydrOXYzine  (ATARAX ) 10 MG tablet Take 1 tablet (10 mg total) by mouth 3 (three) times daily as needed for anxiety or itching. 04/01/24   Cheryle Page, MD  linaclotide  (LINZESS ) 72 MCG capsule Take 72 mcg by mouth daily as needed (constipation).    [provider]  losartan  (COZAAR ) 25 MG tablet Take 25 mg by mouth daily.    [provider]  MELATONIN PO Take 1 tablet by mouth at bedtime as needed (sleep).    [provider]  Multiple Vitamins-Minerals (CENTRUM SILVER WOMEN 50+) TABS Take 1 tablet by mouth daily.    [provider]  oxyCODONE  (OXY IR/ROXICODONE ) 5 MG immediate release tablet Take 1 tablet (5 mg total) by mouth every 6 (six) hours as needed for breakthrough pain (severe pain  not relieved by tramadol). 08/19/24   Lue Fallow BROCKS, MD  Polyvinyl Alcohol -Povidone (REFRESH OP) Place 1 drop into both eyes 2 (two) times daily.    [provider]  promethazine  (PHENERGAN ) 12.5 MG tablet Take 12.5 mg by mouth 3 (three) times daily as needed for nausea or vomiting. 07/17/24   [provider]  Semaglutide, 1 MG/DOSE, (OZEMPIC, 1 MG/DOSE,) 4 MG/3ML SOPN INJECT 1 MG UNDER THE SKIN ONCE A WEEK    [provider]  traMADol (ULTRAM) 50 MG tablet Take 50 mg by mouth every 6 (six) hours as needed (pain not relieived by Tylenol ). 07/03/24   [provider]    Family History Family History  Problem Relation Age of Onset   Alzheimer's disease Mother    Hypertension Mother    Other Father        ruptured appendix   Breast cancer Sister    Hypertension Sister    Hypertension Sister    Hypertension Sister    Heart disease Sister    Colon cancer Brother    Hypertension Brother    Cancer Brother    Memory loss Brother     Social History Social History   Tobacco Use   Smoking status: Never    Passive exposure: Never   Smokeless tobacco: Never  Vaping Use   Vaping status: Never Used  Substance Use Topics   Alcohol  use: Never    Comment: occ   Drug use: No     Allergies   Celebrex [celecoxib], Latex, Neurontin [gabapentin], Nsaids, Penicillins, Wellbutrin [bupropion hcl], Bayer aspirin  [aspirin ], Darvocet [propoxyphene n-acetaminophen ], Ketorolac  tromethamine , Tape, Cipro  [ciprofloxacin  hcl], Fioricet [butalbital-apap-caffeine], Lunesta [eszopiclone], Other, Topamax [topiramate], and Wound dressing adhesive   Review of Systems Review of Systems  Constitutional:  Negative for chills and fever.  HENT:  Positive for dental problem. Negative for ear pain and sore throat.   Eyes:  Negative for pain and visual disturbance.  Respiratory:  Negative for cough and shortness of breath.   Cardiovascular:  Negative for chest pain and  palpitations.  Gastrointestinal:  Negative for abdominal pain and vomiting.  Genitourinary:  Negative for dysuria and hematuria.  Musculoskeletal:  Negative for arthralgias and back pain.  Skin:  Negative for color change and rash.  Neurological:  Negative for seizures and syncope.  All other systems reviewed and are negative.    Physical Exam Triage Vital Signs ED Triage Vitals  Encounter Vitals Group     BP 09/12/24 1110 127/83     Girls Systolic BP Percentile --      Girls Diastolic BP Percentile --      Boys Systolic BP Percentile --      Boys Diastolic BP Percentile --      Pulse Rate 09/12/24 1110 87     Resp 09/12/24 1110 18     Temp 09/12/24 1110 98.3 F (36.8 C)     Temp Source 09/12/24 1110 Oral     SpO2 09/12/24 1110 97 %     Weight 09/12/24 1109 220 lb (99.8 kg)     Height 09/12/24 1109 5' 5 (1.651 m)     Head Circumference --      Peak Flow --      Pain Score 09/12/24 1107 10     Pain Loc --      Pain Education --      Exclude from Growth Chart --    No data found.  Updated Vital Signs BP 127/83 (BP Location: Right Arm)   Pulse 87   Temp 98.3 F (36.8 C) (Oral)   Resp 18   Ht 5' 5 (1.651 m)   Wt 220 lb (99.8 kg)   SpO2 97%   BMI 36.61 kg/m   Visual Acuity Right Eye Distance:   Left Eye Distance:   Bilateral Distance:    Right Eye Near:   Left Eye Near:    Bilateral Near:     Physical Exam Vitals and nursing note  reviewed.  Constitutional:      General: She is not in acute distress.    Appearance: She is well-developed.  HENT:     Head: Normocephalic and atraumatic.     Mouth/Throat:   Eyes:     Conjunctiva/sclera: Conjunctivae normal.  Cardiovascular:     Rate and Rhythm: Normal rate and regular rhythm.     Heart sounds: No murmur heard. Pulmonary:     Effort: Pulmonary effort is normal. No respiratory distress.     Breath sounds: Normal breath sounds.  Abdominal:     Palpations: Abdomen is soft.     Tenderness: There is no  abdominal tenderness.  Musculoskeletal:        General: No swelling.     Cervical back: Neck supple.  Skin:    General: Skin is warm and dry.     Capillary Refill: Capillary refill takes less than 2 seconds.  Neurological:     Mental Status: She is alert.  Psychiatric:        Mood and Affect: Mood normal.      UC Treatments / Results  Labs (all labs ordered are listed, but only abnormal results are displayed) Labs Reviewed - No data to display  EKG   Radiology No results found.  Procedures Procedures (including critical care time)  Medications Ordered in UC Medications - No data to display  Initial Impression / Assessment and Plan / UC Course  I have reviewed the triage vital signs and the nursing notes.  Pertinent labs & imaging results that were available during my care of the patient were reviewed by me and considered in my medical decision making (see chart for details).     Pain, dental  Dental infection   Dental pain and likely dental infection secondary to fractured right lower molar.  We will treat this with antibiotics by mouth as well as medication for pain.  Keep appointment as scheduled with your dentist.  We will treat with the following: Augmentin  875/125 mg twice daily for 7 days.  This is an antibiotic.  Take this with food.  You have had this medication in the past. Hydrocodone /acetaminophen  5/325 mg 1 to 2 tablets every 6 hours as needed for severe pain.  Do not take Tylenol  containing products while you are taking this medication.  Do not take oxycodone  while you are taking this medication as this can cause significant sedation and possible respiratory distress For mild to moderate pain you can use ibuprofen 600 to 800 mg every 6 hours as needed May use cool compresses on the right lower drawl to help with pain and swelling Keep follow-up as scheduled with the dentist Return to urgent care or PCP if symptoms worsen or fail to resolve.    Final  Clinical Impressions(s) / UC Diagnoses   Final diagnoses:  Pain, dental  Dental infection     Discharge Instructions      Dental pain and likely dental infection secondary to fractured right lower molar.  We will treat this with antibiotics by mouth as well as medication for pain.  Keep appointment as scheduled with your dentist.  We will treat with the following: Augmentin  875/125 mg twice daily for 7 days.  This is an antibiotic.  Take this with food.  You have had this medication in the past. Hydrocodone /acetaminophen  5/325 mg 1 to 2 tablets every 6 hours as needed for severe pain.  Do not take Tylenol  containing products while you are taking this medication.  Do not take oxycodone  while you are taking this medication as this can cause significant sedation and possible respiratory distress For mild to moderate pain you can use ibuprofen 600 to 800 mg every 6 hours as needed May use cool compresses on the right lower drawl to help with pain and swelling Keep follow-up as scheduled with the dentist Return to urgent care or PCP if symptoms worsen or fail to resolve.      ED Prescriptions     Medication Sig Dispense Auth. Provider   amoxicillin -clavulanate (AUGMENTIN ) 875-125 MG tablet Take 1 tablet by mouth every 12 (twelve) hours. 14 tablet Witt Plitt A, PA-C   HYDROcodone -acetaminophen  (NORCO/VICODIN) 5-325 MG tablet Take 1-2 tablets by mouth every 6 (six) hours as needed for severe pain (pain score 7-10). 10 tablet Teresa Almarie LABOR, [place]      I have reviewed the PDMP during this encounter.   Teresa Almarie LABOR, PA-C 09/12/24 1140

## 2024-09-13 ENCOUNTER — Emergency Department (HOSPITAL_COMMUNITY)

## 2024-09-13 ENCOUNTER — Other Ambulatory Visit: Payer: Self-pay

## 2024-09-13 ENCOUNTER — Encounter (HOSPITAL_COMMUNITY): Payer: Self-pay

## 2024-09-13 ENCOUNTER — Emergency Department (HOSPITAL_COMMUNITY)
Admission: EM | Admit: 2024-09-13 | Discharge: 2024-09-13 | Disposition: A | Discharge status: Home / Self Care | Attending: Emergency Medicine | Admitting: Emergency Medicine

## 2024-09-13 DIAGNOSIS — R112 Nausea with vomiting, unspecified: Secondary | ICD-10-CM | POA: Diagnosis not present

## 2024-09-13 DIAGNOSIS — R1084 Generalized abdominal pain: Secondary | ICD-10-CM | POA: Diagnosis present

## 2024-09-13 DIAGNOSIS — Z91040 Latex allergy status: Secondary | ICD-10-CM | POA: Insufficient documentation

## 2024-09-13 LAB — CBC WITH DIFFERENTIAL/PLATELET
Abs Immature Granulocytes: 0.01 K/uL (ref 0.00–0.07)
Basophils Absolute: 0 K/uL (ref 0.0–0.1)
Basophils Relative: 0 %
Eosinophils Absolute: 0 K/uL (ref 0.0–0.5)
Eosinophils Relative: 0 %
HCT: 42.7 % (ref 36.0–46.0)
Hemoglobin: 13.2 g/dL (ref 12.0–15.0)
Immature Granulocytes: 0 %
Lymphocytes Relative: 44 %
Lymphs Abs: 2.4 K/uL (ref 0.7–4.0)
MCH: 26.1 pg (ref 26.0–34.0)
MCHC: 30.9 g/dL (ref 30.0–36.0)
MCV: 84.4 fL (ref 80.0–100.0)
Monocytes Absolute: 0.4 K/uL (ref 0.1–1.0)
Monocytes Relative: 7 %
Neutro Abs: 2.6 K/uL (ref 1.7–7.7)
Neutrophils Relative %: 49 %
Platelets: 311 K/uL (ref 150–400)
RBC: 5.06 MIL/uL (ref 3.87–5.11)
RDW: 15.2 % (ref 11.5–15.5)
WBC: 5.4 K/uL (ref 4.0–10.5)
nRBC: 0 % (ref 0.0–0.2)

## 2024-09-13 LAB — COMPREHENSIVE METABOLIC PANEL WITH GFR
ALT: 11 U/L (ref 0–44)
AST: 16 U/L (ref 15–41)
Albumin: 3.9 g/dL (ref 3.5–5.0)
Alkaline Phosphatase: 74 U/L (ref 38–126)
Anion gap: 9 (ref 5–15)
BUN: 11 mg/dL (ref 8–23)
CO2: 23 mmol/L (ref 22–32)
Calcium: 9.2 mg/dL (ref 8.9–10.3)
Chloride: 105 mmol/L (ref 98–111)
Creatinine, Ser: 0.63 mg/dL (ref 0.44–1.00)
GFR, Estimated: >60 mL/min (ref >60)
Glucose, Bld: 108 mg/dL — ABNORMAL HIGH (ref 70–99)
Potassium: 4.3 mmol/L (ref 3.5–5.1)
Sodium: 137 mmol/L (ref 135–145)
Total Bilirubin: 0.4 mg/dL (ref 0.0–1.2)
Total Protein: 7.5 g/dL (ref 6.5–8.1)

## 2024-09-13 LAB — URINALYSIS, W/ REFLEX TO CULTURE (INFECTION SUSPECTED)
Bacteria, UA: NONE SEEN
Bilirubin Urine: NEGATIVE
Glucose, UA: NEGATIVE mg/dL
Hgb urine dipstick: NEGATIVE
Ketones, ur: NEGATIVE mg/dL
Nitrite: NEGATIVE
Protein, ur: NEGATIVE mg/dL
Specific Gravity, Urine: 1.02 (ref 1.005–1.030)
pH: 5 (ref 5.0–8.0)

## 2024-09-13 LAB — LIPASE, BLOOD: Lipase: 23 U/L (ref 11–51)

## 2024-09-13 MED ORDER — ONDANSETRON HCL 4 MG/2ML IJ SOLN
4.0000 mg | Freq: Once | INTRAMUSCULAR | Status: AC
  Administered 2024-09-13: 4 mg via INTRAVENOUS
  Filled 2024-09-13: qty 2

## 2024-09-13 MED ORDER — ONDANSETRON 4 MG PO TBDP: 4.0000 mg | ORAL_TABLET | Freq: Three times a day (TID) | ORAL | 0 refills | Status: DC | PRN

## 2024-09-13 MED ORDER — SODIUM CHLORIDE 0.9 % IV BOLUS
1000.0000 mL | Freq: Once | INTRAVENOUS | Status: AC
  Administered 2024-09-13: 1000 mL via INTRAVENOUS

## 2024-09-13 MED ORDER — IOHEXOL 300 MG/ML  SOLN
100.0000 mL | Freq: Once | INTRAMUSCULAR | Status: AC | PRN
  Administered 2024-09-13: 100 mL via INTRAVENOUS

## 2024-09-13 MED ORDER — HYDROMORPHONE HCL 1 MG/ML IJ SOLN
0.5000 mg | Freq: Once | INTRAMUSCULAR | Status: AC
  Administered 2024-09-13: 0.5 mg via INTRAVENOUS
  Filled 2024-09-13: qty 1

## 2024-09-13 NOTE — ED Triage Notes (Signed)
 PT arrives via POV. States she has an infected tooth for the past week. Reports abdominal pain, nausea, vomiting, and diarrhea since Saturday.

## 2024-09-13 NOTE — Discharge Instructions (Addendum)
 Return for any problem.   Take augmentin  as previously prescribed.

## 2024-09-13 NOTE — ED Notes (Signed)
 Labs and medication administration is delayed waiting for ultrasound IV access

## 2024-09-13 NOTE — ED Provider Notes (Signed)
 [place] EMERGENCY DEPARTMENT AT [facility] Provider Note   CSN: [id number] Arrival date & time: 09/13/24  1046     Patient presents with: Dental Problem, Abdominal Pain, and Emesis   Erin Wolf is a 68 y.o. female.   68 year old female with prior medical history as detailed below presents for evaluation.  Patient reports that she was seen yesterday for evaluation of dental pain and was started on Augmentin  for treatment of suspected dental infection.  Patient reports that the day before that she has developed nausea, vomiting, abdominal pain.  She did not mention the symptoms to urgent care yesterday when she was seeking treatment for her tooth.  She feels that her nausea, vomiting, abdominal pain, diarrhea are suggestive of colitis.  She has taken 1 dose of Augmentin  for treatment of her tooth infection.  She request IV fluids and Dilaudid  for abdominal pain.  She is concerned about recurrent diverticulitis.  The history is provided by the patient and medical records.       Prior to Admission medications   Medication Sig Start Date End Date Taking? Authorizing Provider  acetaminophen  (TYLENOL ) 500 MG tablet Take 1,000 mg by mouth every 6 (six) hours as needed for headache, fever or mild pain (pain score 1-3).    [provider]  amoxicillin -clavulanate (AUGMENTIN ) 875-125 MG tablet Take 1 tablet by mouth every 12 (twelve) hours. 09/12/24   White, Elizabeth A, PA-C  atorvastatin  (LIPITOR) 20 MG tablet Take 20 mg by mouth daily.    [provider]  dicyclomine  (BENTYL ) 20 MG tablet Take 1 tablet (20 mg total) by mouth 3 (three) times daily as needed (abdominal pain). Patient not taking: Reported on 08/15/2024 07/25/24   Vann, Jessica U, DO  ezetimibe  (ZETIA ) 10 MG tablet Take 10 mg by mouth daily. 04/07/24   [provider]  FLUoxetine  (PROZAC ) 20 MG capsule Take 20 mg by mouth daily.    [provider]   HYDROcodone -acetaminophen  (NORCO/VICODIN) 5-325 MG tablet Take 1-2 tablets by mouth every 6 (six) hours as needed for severe pain (pain score 7-10). 09/12/24   White, Elizabeth A, PA-C  hydrOXYzine  (ATARAX ) 10 MG tablet Take 1 tablet (10 mg total) by mouth 3 (three) times daily as needed for anxiety or itching. 04/01/24   Cheryle Page, MD  ibuprofen (ADVIL) 200 MG tablet Take 200 mg by mouth every 6 (six) hours as needed.    [provider]  linaclotide  (LINZESS ) 72 MCG capsule Take 72 mcg by mouth daily as needed (constipation).    [provider]  losartan  (COZAAR ) 25 MG tablet Take 25 mg by mouth daily.    [provider]  MELATONIN PO Take 1 tablet by mouth at bedtime as needed (sleep).    [provider]  Multiple Vitamins-Minerals (CENTRUM SILVER WOMEN 50+) TABS Take 1 tablet by mouth daily.    [provider]  oxyCODONE  (OXY IR/ROXICODONE ) 5 MG immediate release tablet Take 1 tablet (5 mg total) by mouth every 6 (six) hours as needed for breakthrough pain (severe pain not relieved by tramadol). 08/19/24   Lue Elsie BROCKS, MD  Oxycodone  HCl 20 MG TABS Take 1 tablet by mouth 4 (four) times daily as needed. 08/24/24   [provider]  Polyvinyl Alcohol -Povidone (REFRESH OP) Place 1 drop into both eyes 2 (two) times daily.    [provider]  promethazine  (PHENERGAN ) 12.5 MG tablet Take 12.5 mg by mouth 3 (three) times daily as needed for nausea  or vomiting. 07/17/24   [provider]  Semaglutide, 1 MG/DOSE, (OZEMPIC, 1 MG/DOSE,) 4 MG/3ML SOPN INJECT 1 MG UNDER THE SKIN ONCE A WEEK    [provider]  traMADol (ULTRAM) 50 MG tablet Take 50 mg by mouth every 6 (six) hours as needed (pain not relieived by Tylenol ). 07/03/24   [provider]    Allergies: Celebrex [celecoxib], Latex, Neurontin [gabapentin], Nsaids, Penicillins, Wellbutrin [bupropion hcl], Bayer aspirin  [aspirin ], Darvocet [propoxyphene  n-acetaminophen ], Ketorolac  tromethamine , Tape, Cipro  [ciprofloxacin  hcl], Fioricet [butalbital-apap-caffeine], Lunesta [eszopiclone], Other, Topamax [topiramate], and Wound dressing adhesive    Review of Systems  All other systems reviewed and are negative.   Updated Vital Signs BP (!) 161/104 (BP Location: Right Arm)   Pulse 91   Temp 98.4 F (36.9 C) (Oral)   Resp 18   SpO2 100%   Physical Exam Vitals and nursing note reviewed.  Constitutional:      General: She is not in acute distress.    Appearance: Normal appearance. She is well-developed.  HENT:     Head: Normocephalic and atraumatic.  Eyes:     Conjunctiva/sclera: Conjunctivae normal.     Pupils: Pupils are equal, round, and reactive to light.  Cardiovascular:     Rate and Rhythm: Normal rate and regular rhythm.     Heart sounds: Normal heart sounds.  Pulmonary:     Effort: Pulmonary effort is normal. No respiratory distress.     Breath sounds: Normal breath sounds.  Abdominal:     General: There is no distension.     Palpations: Abdomen is soft.     Tenderness: There is generalized abdominal tenderness.  Musculoskeletal:        General: No deformity. Normal range of motion.     Cervical back: Normal range of motion and neck supple.  Skin:    General: Skin is warm and dry.  Neurological:     General: No focal deficit present.     Mental Status: She is alert and oriented to person, place, and time.     (all labs ordered are listed, but only abnormal results are displayed) Labs Reviewed  COMPREHENSIVE METABOLIC PANEL WITH GFR - Abnormal; Notable for the following components:      Result Value   Glucose, Bld 108 (*)    All other components within normal limits  URINALYSIS, W/ REFLEX TO CULTURE (INFECTION SUSPECTED) - Abnormal; Notable for the following components:   APPearance HAZY (*)    Leukocytes,Ua TRACE (*)    All other components within normal limits  LIPASE, BLOOD  CBC WITH DIFFERENTIAL/PLATELET     EKG: None  Radiology: No results found.   Procedures   Medications Ordered in the ED  sodium chloride  0.9 % bolus 1,000 mL (has no administration in time range)  ondansetron  (ZOFRAN ) injection 4 mg (has no administration in time range)  HYDROmorphone  (DILAUDID ) injection 0.5 mg (has no administration in time range)                                    Medical Decision Making Presents with nausea, vomiting, abdominal pain, diarrhea.  Symptoms are most consistent with mild  viral process.  Patient reports history of diverticulitis.  Screening labs obtained without acute abnormality.  CT imaging is also without evidence of significant acute abnormality.  Patient is feeling improved on reevaluation.    She desires discharge home.  She is taking p.o. well prior to discharge.  Amount and/or Complexity of Data Reviewed Labs: ordered. Radiology: ordered.  Risk Prescription drug management.        Final diagnoses:  Generalized abdominal pain    ED Discharge Orders          Ordered    ondansetron  (ZOFRAN -ODT) 4 MG disintegrating tablet  Every 8 hours PRN        09/13/24 1534               Laurice Maude BROCKS, MD 09/13/24 1557

## 2024-09-16 ENCOUNTER — Other Ambulatory Visit: Payer: Self-pay

## 2024-09-16 ENCOUNTER — Emergency Department (HOSPITAL_COMMUNITY)
Admission: EM | Admit: 2024-09-16 | Discharge: 2024-09-16 | Disposition: A | Discharge status: Home / Self Care | Attending: Emergency Medicine | Admitting: Emergency Medicine

## 2024-09-16 ENCOUNTER — Encounter (HOSPITAL_COMMUNITY): Payer: Self-pay | Admitting: Emergency Medicine

## 2024-09-16 ENCOUNTER — Emergency Department (HOSPITAL_COMMUNITY)

## 2024-09-16 DIAGNOSIS — I10 Essential (primary) hypertension: Secondary | ICD-10-CM | POA: Insufficient documentation

## 2024-09-16 DIAGNOSIS — R197 Diarrhea, unspecified: Secondary | ICD-10-CM | POA: Insufficient documentation

## 2024-09-16 DIAGNOSIS — R1013 Epigastric pain: Secondary | ICD-10-CM | POA: Diagnosis not present

## 2024-09-16 DIAGNOSIS — Z91040 Latex allergy status: Secondary | ICD-10-CM | POA: Insufficient documentation

## 2024-09-16 DIAGNOSIS — R109 Unspecified abdominal pain: Secondary | ICD-10-CM

## 2024-09-16 DIAGNOSIS — K920 Hematemesis: Secondary | ICD-10-CM | POA: Diagnosis not present

## 2024-09-16 DIAGNOSIS — R000 Tachycardia, unspecified: Secondary | ICD-10-CM | POA: Diagnosis not present

## 2024-09-16 DIAGNOSIS — L918 Other hypertrophic disorders of the skin: Secondary | ICD-10-CM | POA: Diagnosis not present

## 2024-09-16 DIAGNOSIS — R8289 Other abnormal findings on cytological and histological examination of urine: Secondary | ICD-10-CM | POA: Insufficient documentation

## 2024-09-16 LAB — URINALYSIS, ROUTINE W REFLEX MICROSCOPIC
Bilirubin Urine: NEGATIVE
Glucose, UA: NEGATIVE mg/dL
Hgb urine dipstick: NEGATIVE
Ketones, ur: NEGATIVE mg/dL
Nitrite: NEGATIVE
Protein, ur: NEGATIVE mg/dL
Specific Gravity, Urine: 1.027 (ref 1.005–1.030)
pH: 5 (ref 5.0–8.0)

## 2024-09-16 LAB — COMPREHENSIVE METABOLIC PANEL WITH GFR
ALT: 10 U/L (ref 0–44)
AST: 16 U/L (ref 15–41)
Albumin: 4 g/dL (ref 3.5–5.0)
Alkaline Phosphatase: 73 U/L (ref 38–126)
Anion gap: 12 (ref 5–15)
BUN: 15 mg/dL (ref 8–23)
CO2: 23 mmol/L (ref 22–32)
Calcium: 9.5 mg/dL (ref 8.9–10.3)
Chloride: 103 mmol/L (ref 98–111)
Creatinine, Ser: 0.79 mg/dL (ref 0.44–1.00)
GFR, Estimated: >60 mL/min (ref >60)
Glucose, Bld: 113 mg/dL — ABNORMAL HIGH (ref 70–99)
Potassium: 3.8 mmol/L (ref 3.5–5.1)
Sodium: 138 mmol/L (ref 135–145)
Total Bilirubin: 0.2 mg/dL (ref 0.0–1.2)
Total Protein: 7.7 g/dL (ref 6.5–8.1)

## 2024-09-16 LAB — TYPE AND SCREEN
ABO/RH(D): O POS
Antibody Screen: NEGATIVE

## 2024-09-16 LAB — CBC
HCT: 43.6 % (ref 36.0–46.0)
Hemoglobin: 13.6 g/dL (ref 12.0–15.0)
MCH: 26.1 pg (ref 26.0–34.0)
MCHC: 31.2 g/dL (ref 30.0–36.0)
MCV: 83.5 fL (ref 80.0–100.0)
Platelets: 348 K/uL (ref 150–400)
RBC: 5.22 MIL/uL — ABNORMAL HIGH (ref 3.87–5.11)
RDW: 15.2 % (ref 11.5–15.5)
WBC: 6.5 K/uL (ref 4.0–10.5)
nRBC: 0 % (ref 0.0–0.2)

## 2024-09-16 LAB — POC OCCULT BLOOD, ED: Fecal Occult Bld: NEGATIVE

## 2024-09-16 LAB — LIPASE, BLOOD: Lipase: 26 U/L (ref 11–51)

## 2024-09-16 MED ORDER — PANTOPRAZOLE SODIUM 20 MG PO TBEC: 20.0000 mg | DELAYED_RELEASE_TABLET | Freq: Every day | ORAL | 0 refills | Status: AC

## 2024-09-16 MED ORDER — IOHEXOL 300 MG/ML  SOLN: 100.0000 mL | Freq: Once | INTRAMUSCULAR | Status: DC | PRN

## 2024-09-16 MED ORDER — ONDANSETRON HCL 4 MG/2ML IJ SOLN
4.0000 mg | Freq: Once | INTRAMUSCULAR | Status: AC
  Administered 2024-09-16: 4 mg via INTRAVENOUS
  Filled 2024-09-16: qty 2

## 2024-09-16 MED ORDER — FENTANYL CITRATE (PF) 50 MCG/ML IJ SOSY
50.0000 ug | PREFILLED_SYRINGE | Freq: Once | INTRAMUSCULAR | Status: AC
  Administered 2024-09-16: 50 ug via INTRAVENOUS
  Filled 2024-09-16: qty 1

## 2024-09-16 MED ORDER — OXYCODONE HCL 5 MG PO TABS: 5.0000 mg | ORAL_TABLET | Freq: Four times a day (QID) | ORAL | 0 refills | Status: DC | PRN

## 2024-09-16 MED ORDER — HYDROMORPHONE HCL 1 MG/ML IJ SOLN
1.0000 mg | Freq: Once | INTRAMUSCULAR | Status: AC
  Administered 2024-09-16: 1 mg via INTRAVENOUS
  Filled 2024-09-16: qty 1

## 2024-09-16 MED ORDER — SUCRALFATE 1 G PO TABS: 1.0000 g | ORAL_TABLET | Freq: Three times a day (TID) | ORAL | 0 refills | Status: DC

## 2024-09-16 MED ORDER — MORPHINE SULFATE (PF) 4 MG/ML IV SOLN
4.0000 mg | Freq: Once | INTRAVENOUS | Status: AC
  Administered 2024-09-16: 4 mg via INTRAVENOUS
  Filled 2024-09-16: qty 1

## 2024-09-16 MED ORDER — SUCRALFATE 1 G PO TABS: 1.0000 g | ORAL_TABLET | Freq: Three times a day (TID) | ORAL | 0 refills | Status: AC

## 2024-09-16 MED ORDER — PANTOPRAZOLE SODIUM 20 MG PO TBEC: 20.0000 mg | DELAYED_RELEASE_TABLET | Freq: Every day | ORAL | 0 refills | Status: DC

## 2024-09-16 MED ORDER — PANTOPRAZOLE SODIUM 40 MG IV SOLR
40.0000 mg | Freq: Once | INTRAVENOUS | Status: AC
  Administered 2024-09-16: 40 mg via INTRAVENOUS
  Filled 2024-09-16: qty 10

## 2024-09-16 NOTE — ED Triage Notes (Signed)
 Patient presents due to abdominal pain that radiates into the chest after eating. She reports vomiting blood and has diarrhea.

## 2024-09-16 NOTE — ED Provider Notes (Signed)
 I provided a substantive portion of the care of this patient.  I personally made/approved the management plan for this patient and take responsibility for the patient management.      68 year old presents with epigastric abdominal pain and burning after having these given meal.  Has history of reflux.  Labs here are reassuring.  Will treat with PPI and Carafate  and check abdominal CT and likely outpatient follow-up GI   Dasie Faden, MD 09/16/24 2059

## 2024-09-16 NOTE — Discharge Instructions (Signed)
 Today you were seen for abdominal pain, diarrhea, and hematemesis.  Please pick up your medications and take as prescribed.  Please follow-up with gastroenterology as soon as possible for further evaluation and workup.  Thank you for letting us  treat you today. After reviewing your labs, I feel you are safe to go home. Please follow up with your PCP in the next several days and provide them with your records from this visit. Return to the Emergency Room if pain becomes severe or symptoms worsen.

## 2024-09-16 NOTE — ED Provider Notes (Addendum)
 [place] EMERGENCY DEPARTMENT AT [facility] Provider Note   CSN: [id number] Arrival date & time: 09/16/24  1758     Patient presents with: Abdominal Pain, Hematemesis, and Diarrhea   Erin Wolf is a 68 y.o. female past medical history significant for ulcerative colitis, GERD, hypertension, and diverticulitis presents today for abdominal pain that radiates into her chest after eating.  Patient also reports hematemesis and diarrhea.    Abdominal Pain Associated symptoms: diarrhea and vomiting   Diarrhea Associated symptoms: abdominal pain and vomiting        Prior to Admission medications   Medication Sig Start Date End Date Taking? Authorizing Provider  acetaminophen  (TYLENOL ) 500 MG tablet Take 1,000 mg by mouth every 6 (six) hours as needed for headache, fever or mild pain (pain score 1-3).   Yes [provider]  amoxicillin -clavulanate (AUGMENTIN ) 875-125 MG tablet Take 1 tablet by mouth every 12 (twelve) hours. 09/12/24  Yes White, Elizabeth A, PA-C  dicyclomine  (BENTYL ) 20 MG tablet Take 1 tablet (20 mg total) by mouth 3 (three) times daily as needed (abdominal pain). 07/25/24  Yes Vann, Jessica U, DO  FLUoxetine  (PROZAC ) 20 MG capsule Take 20 mg by mouth daily.   Yes [provider]  hydrOXYzine  (ATARAX ) 10 MG tablet Take 1 tablet (10 mg total) by mouth 3 (three) times daily as needed for anxiety or itching. Patient taking differently: Take 10 mg by mouth 3 (three) times daily as needed for anxiety (or sleep). 04/01/24  Yes Cheryle Page, MD  linaclotide  (LINZESS ) 72 MCG capsule Take 72 mcg by mouth daily as needed (constipation).   Yes [provider]  melatonin 1 MG TABS tablet Take 1 mg by mouth at bedtime as needed (for sleep).   Yes [provider]  Multiple Vitamins-Minerals (CENTRUM SILVER WOMEN 50+) TABS Take 1 tablet by mouth daily with breakfast.   Yes [provider]  ondansetron  (ZOFRAN -ODT) 4 MG  disintegrating tablet Take 1 tablet (4 mg total) by mouth every 8 (eight) hours as needed for nausea or vomiting. Patient taking differently: Take 4 mg by mouth every 8 (eight) hours as needed for nausea or vomiting (dissolve orally). 09/13/24  Yes Laurice Maude BROCKS, MD  oxyCODONE  (ROXICODONE ) 5 MG immediate release tablet Take 1 tablet (5 mg total) by mouth every 6 (six) hours as needed for severe pain (pain score 7-10). 09/16/24  Yes Francis Ileana SAILOR, PA-C  Oxycodone  HCl 20 MG TABS Take 1 tablet by mouth 4 (four) times daily as needed (for pain). 08/24/24  Yes [provider]  promethazine  (PHENERGAN ) 12.5 MG tablet Take 12.5 mg by mouth 3 (three) times daily as needed for nausea or vomiting. 07/17/24  Yes [provider]  REFRESH 1.4-0.6 % SOLN Place 1 drop into both eyes 3 (three) times daily as needed (for dryness).   Yes [provider]  traMADol (ULTRAM) 50 MG tablet Take 50 mg by mouth every 6 (six) hours as needed (for pain not relieved by Tylenol ). 07/03/24  Yes [provider]  HYDROcodone -acetaminophen  (NORCO/VICODIN) 5-325 MG tablet Take 1-2 tablets by mouth every 6 (six) hours as needed for severe pain (pain score 7-10). Patient not taking: Reported on 09/16/2024 09/12/24   Teresa Almarie LABOR, PA-C  oxyCODONE  (OXY IR/ROXICODONE ) 5 MG immediate release tablet Take 1 tablet (5 mg total) by mouth every 6 (six) hours as needed for breakthrough pain (severe pain not relieved by tramadol). Patient not taking: Reported on 09/16/2024 08/19/24  Lue Elsie BROCKS, MD  pantoprazole  (PROTONIX ) 20 MG tablet Take 1 tablet (20 mg total) by mouth daily. 09/16/24   Qusai Kem N, PA-C  sucralfate  (CARAFATE ) 1 g tablet Take 1 tablet (1 g total) by mouth 4 (four) times daily -  with meals and at bedtime. 09/16/24   Francis Ileana SAILOR, PA-C    Allergies: Acetaminophen , Bupropion hcl, Celebrex [celecoxib], Ciprofloxacin , Eszopiclone, Ibuprofen, Ketorolac  tromethamine , Latex,  Neurontin [gabapentin], Nsaids, Other, Penicillins, Bayer aspirin  [aspirin ], Darvocet [propoxyphene n-acetaminophen ], Tape, Cipro  [ciprofloxacin  hcl], Fioricet [butalbital-apap-caffeine], Ozempic (0.25 or 0.5 mg-dose) [semaglutide(0.25 or 0.5mg -dos)], Topiramate, and Wound dressing adhesive    Review of Systems  Gastrointestinal:  Positive for abdominal pain, diarrhea and vomiting.    Updated Vital Signs BP (!) 144/86   Pulse 92   Temp 98 F (36.7 C)   Resp 18   SpO2 98%   Physical Exam Vitals and nursing note reviewed. Exam conducted with a chaperone present.  Constitutional:      General: She is not in acute distress.    Appearance: She is well-developed. She is not toxic-appearing.  HENT:     Head: Normocephalic and atraumatic.  Eyes:     Conjunctiva/sclera: Conjunctivae normal.  Cardiovascular:     Rate and Rhythm: Regular rhythm. Tachycardia present.     Heart sounds: Normal heart sounds. No murmur heard. Pulmonary:     Effort: Pulmonary effort is normal. No respiratory distress.     Breath sounds: Normal breath sounds.  Abdominal:     General: Abdomen is flat. There is no distension.     Palpations: Abdomen is soft.     Tenderness: There is abdominal tenderness in the epigastric area.  Genitourinary:    Rectum: No anal fissure, external hemorrhoid or internal hemorrhoid. Normal anal tone.     Comments: Skin tag noted just anterior to the anus Musculoskeletal:        General: No swelling.     Cervical back: Neck supple.  Skin:    General: Skin is warm and dry.     Capillary Refill: Capillary refill takes less than 2 seconds.  Neurological:     General: No focal deficit present.     Mental Status: She is alert and oriented to person, place, and time.  Psychiatric:        Mood and Affect: Mood normal.     (all labs ordered are listed, but only abnormal results are displayed) Labs Reviewed  COMPREHENSIVE METABOLIC PANEL WITH GFR - Abnormal; Notable for the  following components:      Result Value   Glucose, Bld 113 (*)    All other components within normal limits  CBC - Abnormal; Notable for the following components:   RBC 5.22 (*)    All other components within normal limits  URINALYSIS, ROUTINE W REFLEX MICROSCOPIC - Abnormal; Notable for the following components:   APPearance HAZY (*)    Leukocytes,Ua SMALL (*)    Bacteria, UA RARE (*)    All other components within normal limits  LIPASE, BLOOD  POC OCCULT BLOOD, ED  TYPE AND SCREEN    EKG: None  Radiology: No results found.   Procedures   Medications Ordered in the ED  HYDROmorphone  (DILAUDID ) injection 1 mg (has no administration in time range)  morphine  (PF) 4 MG/ML injection 4 mg (4 mg Intravenous Given 09/16/24 1919)  ondansetron  (ZOFRAN ) injection 4 mg (4 mg Intravenous Given 09/16/24 1919)  fentaNYL  (SUBLIMAZE ) injection 50 mcg (50 mcg Intravenous Given 09/16/24  2010)  pantoprazole  (PROTONIX ) injection 40 mg (40 mg Intravenous Given 09/16/24 2010)                                    Medical Decision Making Amount and/or Complexity of Data Reviewed Labs: ordered.  Risk Prescription drug management.   This patient presents to the ED for concern of abdominal pain with hematemesis and diarrhea differential diagnosis includes upper GI bleed, lower GI bleed, Mallory-Weiss tear, Boerhaave syndrome, diverticulitis, ulcerative colitis    Additional history obtained   Additional history obtained from Electronic Medical Record External records from outside source obtained and reviewed including previous admission document   Lab Tests:  I Ordered, and personally interpreted labs.  The pertinent results include: CBC unremarkable, UA with small leukocytes, rare bacteria, 11-20 squamous epithelials, 11-20 WBCs, occult stool negative   Imaging Studies ordered:  I ordered imaging studies including CT abdomen pelvis with contrast I independently visualized and  interpreted imaging which showed patient refused CT at this time I agree with the radiologist interpretation   Medicines ordered and prescription drug management:  I ordered medication including fentanyl  and Protonix , morphine  and zofran     I have reviewed the patients home medicines and have made adjustments as needed   Problem List / ED Course:  Considered for admission or further workup, however patient declined abdominal imaging at this time.  Patient has had previous abdominal imaging and is scheduled to follow-up with gastroenterology in the upcoming weeks.  Patient started on PPI and Carafate .  Patient's labs, vitals, and physical exam are reassuring.  Patient given return precautions.  I feel patient is safe for discharge at this time.     Final diagnoses:  Hematemesis with nausea  Abdominal pain, unspecified abdominal location  Diarrhea, unspecified type    ED Discharge Orders          Ordered    sucralfate  (CARAFATE ) 1 g tablet  3 times daily with meals & bedtime,   Status:  Discontinued        09/16/24 2131    pantoprazole  (PROTONIX ) 20 MG tablet  Daily,   Status:  Discontinued        09/16/24 2131    oxyCODONE  (ROXICODONE ) 5 MG immediate release tablet  Every 6 hours PRN        09/16/24 2200    pantoprazole  (PROTONIX ) 20 MG tablet  Daily        09/16/24 2200    sucralfate  (CARAFATE ) 1 g tablet  3 times daily with meals & bedtime        09/16/24 2200               Francis Ileana SAILOR, PA-C 09/16/24 2134    Francis Ileana SAILOR, PA-C 09/16/24 2201    Dasie Faden, MD 09/18/24 [phone]

## 2024-10-16 ENCOUNTER — Emergency Department (HOSPITAL_BASED_OUTPATIENT_CLINIC_OR_DEPARTMENT_OTHER)
Admission: EM | Admit: 2024-10-16 | Discharge: 2024-10-16 | Disposition: A | Discharge status: Home / Self Care | Attending: Emergency Medicine | Admitting: Emergency Medicine

## 2024-10-16 ENCOUNTER — Other Ambulatory Visit: Payer: Self-pay

## 2024-10-16 ENCOUNTER — Encounter (HOSPITAL_BASED_OUTPATIENT_CLINIC_OR_DEPARTMENT_OTHER): Payer: Self-pay

## 2024-10-16 DIAGNOSIS — R1084 Generalized abdominal pain: Secondary | ICD-10-CM | POA: Diagnosis not present

## 2024-10-16 DIAGNOSIS — R112 Nausea with vomiting, unspecified: Secondary | ICD-10-CM | POA: Insufficient documentation

## 2024-10-16 DIAGNOSIS — Z91040 Latex allergy status: Secondary | ICD-10-CM | POA: Diagnosis not present

## 2024-10-16 LAB — URINALYSIS, ROUTINE W REFLEX MICROSCOPIC
Bilirubin Urine: NEGATIVE
Glucose, UA: NEGATIVE mg/dL
Hgb urine dipstick: NEGATIVE
Ketones, ur: 15 mg/dL — AB
Leukocytes,Ua: NEGATIVE
Nitrite: NEGATIVE
Protein, ur: NEGATIVE mg/dL
Specific Gravity, Urine: 1.025 (ref 1.005–1.030)
pH: 6 (ref 5.0–8.0)

## 2024-10-16 LAB — COMPREHENSIVE METABOLIC PANEL WITH GFR
ALT: 33 U/L (ref 0–44)
AST: 26 U/L (ref 15–41)
Albumin: 4.2 g/dL (ref 3.5–5.0)
Alkaline Phosphatase: 112 U/L (ref 38–126)
Anion gap: 13 (ref 5–15)
BUN: 7 mg/dL — ABNORMAL LOW (ref 8–23)
CO2: 20 mmol/L — ABNORMAL LOW (ref 22–32)
Calcium: 9.6 mg/dL (ref 8.9–10.3)
Chloride: 104 mmol/L (ref 98–111)
Creatinine, Ser: 0.62 mg/dL (ref 0.44–1.00)
GFR, Estimated: >60 mL/min
Glucose, Bld: 105 mg/dL — ABNORMAL HIGH (ref 70–99)
Potassium: 4.4 mmol/L (ref 3.5–5.1)
Sodium: 136 mmol/L (ref 135–145)
Total Bilirubin: 0.4 mg/dL (ref 0.0–1.2)
Total Protein: 8.4 g/dL — ABNORMAL HIGH (ref 6.5–8.1)

## 2024-10-16 LAB — CBC
HCT: 45.9 % (ref 36.0–46.0)
Hemoglobin: 14.7 g/dL (ref 12.0–15.0)
MCH: 26.6 pg (ref 26.0–34.0)
MCHC: 32 g/dL (ref 30.0–36.0)
MCV: 83 fL (ref 80.0–100.0)
Platelets: 349 K/uL (ref 150–400)
RBC: 5.53 MIL/uL — ABNORMAL HIGH (ref 3.87–5.11)
RDW: 15.1 % (ref 11.5–15.5)
WBC: 5.4 K/uL (ref 4.0–10.5)
nRBC: 0 % (ref 0.0–0.2)

## 2024-10-16 LAB — LIPASE, BLOOD: Lipase: 16 U/L (ref 11–51)

## 2024-10-16 MED ORDER — OXYCODONE HCL 5 MG PO TABS: 5.0000 mg | ORAL_TABLET | Freq: Three times a day (TID) | ORAL | 0 refills | Status: AC | PRN

## 2024-10-16 MED ORDER — LORAZEPAM 1 MG PO TABS: 1.0000 mg | ORAL_TABLET | Freq: Once | ORAL | Status: AC

## 2024-10-16 MED ORDER — DICYCLOMINE HCL 10 MG/ML IM SOLN
10.0000 mg | Freq: Once | INTRAMUSCULAR | Status: AC
  Administered 2024-10-16: 10 mg via INTRAMUSCULAR
  Filled 2024-10-16: qty 2

## 2024-10-16 MED ORDER — LORAZEPAM 2 MG/ML IJ SOLN
1.0000 mg | Freq: Once | INTRAMUSCULAR | Status: AC
  Administered 2024-10-16: 1 mg via INTRAMUSCULAR
  Filled 2024-10-16: qty 1

## 2024-10-16 MED ORDER — DICYCLOMINE HCL 10 MG PO CAPS: 10.0000 mg | ORAL_CAPSULE | Freq: Three times a day (TID) | ORAL | 0 refills | Status: AC

## 2024-10-16 MED ORDER — LORAZEPAM 1 MG PO TABS
1.0000 mg | ORAL_TABLET | Freq: Once | ORAL | Status: AC
  Administered 2024-10-16: 1 mg via ORAL
  Filled 2024-10-16: qty 1

## 2024-10-16 MED ORDER — LOSARTAN POTASSIUM 25 MG PO TABS: 25.0000 mg | ORAL_TABLET | Freq: Every day | ORAL | 0 refills | Status: AC

## 2024-10-16 MED ORDER — OXYCODONE HCL 5 MG PO TABS
20.0000 mg | ORAL_TABLET | Freq: Once | ORAL | Status: AC
  Administered 2024-10-16: 20 mg via ORAL
  Filled 2024-10-16: qty 4

## 2024-10-16 MED ORDER — ONDANSETRON 4 MG PO TBDP
4.0000 mg | ORAL_TABLET | Freq: Once | ORAL | Status: AC
  Administered 2024-10-16: 4 mg via ORAL
  Filled 2024-10-16: qty 1

## 2024-10-16 MED ORDER — ONDANSETRON 4 MG PO TBDP: 4.0000 mg | ORAL_TABLET | Freq: Three times a day (TID) | ORAL | 0 refills | Status: AC | PRN

## 2024-10-16 MED ORDER — HYDROMORPHONE HCL 1 MG/ML IJ SOLN
1.0000 mg | Freq: Once | INTRAMUSCULAR | Status: AC
  Administered 2024-10-16: 1 mg via INTRAMUSCULAR
  Filled 2024-10-16: qty 1

## 2024-10-16 NOTE — Discharge Instructions (Addendum)
 Return if any problems.

## 2024-10-16 NOTE — ED Triage Notes (Signed)
 C/o abdominal pain, back pain, nausea, vomiting since yesterday.  Takes bentyl , zofran  and oxycodone  daily, was unable to take the past 2 days as airline lost her luggage with meds in it.

## 2024-10-16 NOTE — ED Provider Notes (Signed)
 " [place] [facility] AT [facility] Provider Note   CSN: [id number] Arrival date & time: 10/16/24  1240     Patient presents with: Abdominal Pain   Erin Wolf is a 68 y.o. female.   Patient has a history of diverticulitis.  Patient reports she began having nausea and vomiting yesterday.  Patient reports symptoms have become worse.  Patient states that she does not have any of her regular medications.  Patient states that the airlines lost her luggage.  Patient states she does not have her blood pressure medicine Bentyl  Zofran  or her pain medicine oxycodone  20 mg tablets.  Patient states she cannot see her primary care physician regarding her medications until Monday.  Patient reports she has been experiencing a a lot of anxiety recently.  Patient states she does not like to fly and between flying and the airlines losing her luggage she has been very upset.  Patient is asking for medication for pain nausea and anxiety.  The history is provided by the patient. No language interpreter was used.  Abdominal Pain Pain location:  Generalized Pain quality: cramping   Pain radiates to:  Does not radiate Duration:  2 days Timing:  Constant Context: medication withdrawal and recent travel   Relieved by:  None tried Worsened by:  Nothing Associated symptoms: nausea and vomiting        Prior to Admission medications  Medication Sig Start Date End Date Taking? Authorizing Provider  dicyclomine  (BENTYL ) 10 MG capsule Take 1 capsule (10 mg total) by mouth 4 (four) times daily -  before meals and at bedtime. 10/16/24  Yes Tanya Crothers K, PA-C  losartan  (COZAAR ) 25 MG tablet Take 1 tablet (25 mg total) by mouth daily. 10/16/24 10/16/25 Yes Denver Bentson K, PA-C  ondansetron  (ZOFRAN -ODT) 4 MG disintegrating tablet Take 1 tablet (4 mg total) by mouth every 8 (eight) hours as needed for nausea or vomiting. 10/16/24  Yes Retha Bither K, PA-C  oxyCODONE  (ROXICODONE ) 5 MG  immediate release tablet Take 1 tablet (5 mg total) by mouth every 8 (eight) hours as needed. 10/16/24 10/16/25 Yes Novah Nessel K, PA-C  acetaminophen  (TYLENOL ) 500 MG tablet Take 1,000 mg by mouth every 6 (six) hours as needed for headache, fever or mild pain (pain score 1-3).    [provider]  amoxicillin -clavulanate (AUGMENTIN ) 875-125 MG tablet Take 1 tablet by mouth every 12 (twelve) hours. 09/12/24   White, Elizabeth A, PA-C  FLUoxetine  (PROZAC ) 20 MG capsule Take 20 mg by mouth daily.    [provider]  linaclotide  (LINZESS ) 72 MCG capsule Take 72 mcg by mouth daily as needed (constipation).    [provider]  melatonin 1 MG TABS tablet Take 1 mg by mouth at bedtime as needed (for sleep).    [provider]  Multiple Vitamins-Minerals (CENTRUM SILVER WOMEN 50+) TABS Take 1 tablet by mouth daily with breakfast.    [provider]  pantoprazole  (PROTONIX ) 20 MG tablet Take 1 tablet (20 mg total) by mouth daily. 09/16/24   Keith, Kayla N, PA-C  promethazine  (PHENERGAN ) 12.5 MG tablet Take 12.5 mg by mouth 3 (three) times daily as needed for nausea or vomiting. 07/17/24   [provider]  REFRESH 1.4-0.6 % SOLN Place 1 drop into both eyes 3 (three) times daily as needed (for dryness).    [provider]  sucralfate  (CARAFATE ) 1 g tablet Take 1 tablet (1 g total) by mouth 4 (four) times daily -  with meals and at bedtime. 09/16/24   Francis Ileana SAILOR, PA-C  traMADol (ULTRAM) 50 MG tablet Take 50 mg by mouth every 6 (six) hours as needed (for pain not relieved by Tylenol ). 07/03/24   [provider]    Allergies: Acetaminophen , Bupropion hcl, Celebrex [celecoxib], Ciprofloxacin , Eszopiclone, Ibuprofen, Ketorolac  tromethamine , Latex, Neurontin [gabapentin], Nsaids, Other, Penicillins, Bayer aspirin  [aspirin ], Darvocet [propoxyphene n-acetaminophen ], Tape, Cipro  [ciprofloxacin  hcl], Fioricet [butalbital-apap-caffeine], Ozempic  (0.25 or 0.5 mg-dose) [semaglutide(0.25 or 0.5mg -dos)], Topiramate, and Wound dressing adhesive    Review of Systems  Gastrointestinal:  Positive for abdominal pain, nausea and vomiting.  All other systems reviewed and are negative.   Updated Vital Signs BP (!) 151/98 (BP Location: Right Arm)   Pulse 88   Temp 98.3 F (36.8 C) (Oral)   Resp 16   Ht 5' 4 (1.626 m)   Wt 99.8 kg   SpO2 100%   BMI 37.76 kg/m   Physical Exam Vitals and nursing note reviewed.  Constitutional:      Appearance: She is well-developed.  HENT:     Head: Normocephalic.  Cardiovascular:     Rate and Rhythm: Normal rate.  Pulmonary:     Effort: Pulmonary effort is normal.  Abdominal:     General: Abdomen is flat. Bowel sounds are normal. There is no distension.     Palpations: Abdomen is soft.     Tenderness: There is no abdominal tenderness.  Musculoskeletal:        General: Normal range of motion.     Cervical back: Normal range of motion.  Skin:    General: Skin is warm.  Neurological:     General: No focal deficit present.     Mental Status: She is alert and oriented to person, place, and time.     (all labs ordered are listed, but only abnormal results are displayed) Labs Reviewed  COMPREHENSIVE METABOLIC PANEL WITH GFR - Abnormal; Notable for the following components:      Result Value   CO2 20 (*)    Glucose, Bld 105 (*)    BUN 7 (*)    Total Protein 8.4 (*)    All other components within normal limits  CBC - Abnormal; Notable for the following components:   RBC 5.53 (*)    All other components within normal limits  URINALYSIS, ROUTINE W REFLEX MICROSCOPIC - Abnormal; Notable for the following components:   APPearance HAZY (*)    Ketones, ur 15 (*)    All other components within normal limits  LIPASE, BLOOD    EKG: None  Radiology: No results found.   Procedures   Medications Ordered in the ED  ondansetron  (ZOFRAN -ODT) disintegrating tablet 4 mg (4 mg Oral Given  10/16/24 1507)  LORazepam  (ATIVAN ) tablet 1 mg (1 mg Oral Given 10/16/24 1508)  oxyCODONE  (Oxy IR/ROXICODONE ) immediate release tablet 20 mg (20 mg Oral Given 10/16/24 1508)  HYDROmorphone  (DILAUDID ) injection 1 mg (1 mg Intramuscular Given 10/16/24 1623)  LORazepam  (ATIVAN ) tablet 1 mg ( Oral See Alternative 10/16/24 1623)    Or  LORazepam  (ATIVAN ) injection 1 mg (1 mg Intramuscular Given 10/16/24 1623)  dicyclomine  (BENTYL ) injection 10 mg (10 mg Intramuscular Given 10/16/24 1623)                                    Medical Decision Making Patient complains of nausea and vomiting.  Patient states she has been  very anxious because she recently had to fly and she does not like flying.  Patient states the airline lost her luggage and all of her medications which has made her more anxious.  Patient states she also has been without her medications.  Patient states all of her medications were in her luggage.  Patient states she is not scheduled to get her medications refilled until January 3  Amount and/or Complexity of Data Reviewed Labs: ordered.    Details: Labs ordered reviewed and interpreted patient has a normal white blood cell count, hemoglobin is normal chemistries are normal.  Risk Prescription drug management. Risk Details: Patient was given oral oxycodone  Zofran  and Bentyl  and patient vomited immediately after receiving.  Patient was given injection of Bentyl , Ativan  and Dilaudid .  Patient has not had any further vomiting.  Patient symptoms seem to be related to anxiety and chronic pain.  I have prescribed medications for patient.  She is advised she will need to talk to her primary care physician on Monday about filling her medications early.  Patient has had multiple CT scans.  CT scan deferred today she is advised to return to the emergency department if symptoms worsen or change.  Patient is discharged in stable condition.        Final diagnoses:  Nausea and vomiting,  unspecified vomiting type    ED Discharge Orders          Ordered    dicyclomine  (BENTYL ) 10 MG capsule  3 times daily before meals & bedtime        10/16/24 1622    ondansetron  (ZOFRAN -ODT) 4 MG disintegrating tablet  Every 8 hours PRN        10/16/24 1622    oxyCODONE  (ROXICODONE ) 5 MG immediate release tablet  Every 8 hours PRN        10/16/24 1622    losartan  (COZAAR ) 25 MG tablet  Daily        10/16/24 1623            An After Visit Summary was printed and given to the patient.    Flint Sonny POUR, [place] 10/16/24 1742  "

## 2024-10-16 NOTE — ED Notes (Signed)
 Patient transferred from waiting room to ED treatment room. Assuming pt care at this time.

## 2024-10-19 ENCOUNTER — Emergency Department (HOSPITAL_BASED_OUTPATIENT_CLINIC_OR_DEPARTMENT_OTHER)
Admission: EM | Admit: 2024-10-19 | Discharge: 2024-10-19 | Disposition: A | Discharge status: Home / Self Care | Attending: Emergency Medicine | Admitting: Emergency Medicine

## 2024-10-19 ENCOUNTER — Other Ambulatory Visit: Payer: Self-pay

## 2024-10-19 ENCOUNTER — Encounter (HOSPITAL_BASED_OUTPATIENT_CLINIC_OR_DEPARTMENT_OTHER): Payer: Self-pay | Admitting: Emergency Medicine

## 2024-10-19 ENCOUNTER — Emergency Department (HOSPITAL_BASED_OUTPATIENT_CLINIC_OR_DEPARTMENT_OTHER)

## 2024-10-19 DIAGNOSIS — R8289 Other abnormal findings on cytological and histological examination of urine: Secondary | ICD-10-CM | POA: Diagnosis not present

## 2024-10-19 DIAGNOSIS — K5732 Diverticulitis of large intestine without perforation or abscess without bleeding: Secondary | ICD-10-CM | POA: Diagnosis not present

## 2024-10-19 DIAGNOSIS — K5792 Diverticulitis of intestine, part unspecified, without perforation or abscess without bleeding: Secondary | ICD-10-CM

## 2024-10-19 DIAGNOSIS — Z79899 Other long term (current) drug therapy: Secondary | ICD-10-CM | POA: Diagnosis not present

## 2024-10-19 DIAGNOSIS — R1031 Right lower quadrant pain: Secondary | ICD-10-CM | POA: Diagnosis present

## 2024-10-19 DIAGNOSIS — Z91040 Latex allergy status: Secondary | ICD-10-CM | POA: Insufficient documentation

## 2024-10-19 DIAGNOSIS — R319 Hematuria, unspecified: Secondary | ICD-10-CM | POA: Diagnosis not present

## 2024-10-19 LAB — COMPREHENSIVE METABOLIC PANEL WITH GFR
ALT: 20 U/L (ref 0–44)
AST: 20 U/L (ref 15–41)
Albumin: 4.5 g/dL (ref 3.5–5.0)
Alkaline Phosphatase: 99 U/L (ref 38–126)
Anion gap: 14 (ref 5–15)
BUN: 8 mg/dL (ref 8–23)
CO2: 23 mmol/L (ref 22–32)
Calcium: 10 mg/dL (ref 8.9–10.3)
Chloride: 103 mmol/L (ref 98–111)
Creatinine, Ser: 0.68 mg/dL (ref 0.44–1.00)
GFR, Estimated: >60 mL/min
Glucose, Bld: 81 mg/dL (ref 70–99)
Potassium: 4.1 mmol/L (ref 3.5–5.1)
Sodium: 139 mmol/L (ref 135–145)
Total Bilirubin: 0.5 mg/dL (ref 0.0–1.2)
Total Protein: 8.5 g/dL — ABNORMAL HIGH (ref 6.5–8.1)

## 2024-10-19 LAB — URINALYSIS, ROUTINE W REFLEX MICROSCOPIC
Glucose, UA: NEGATIVE mg/dL
Ketones, ur: 15 mg/dL — AB
Nitrite: NEGATIVE
Protein, ur: 100 mg/dL — AB
Specific Gravity, Urine: >=1.03 (ref 1.005–1.030)
pH: 6 (ref 5.0–8.0)

## 2024-10-19 LAB — URINALYSIS, MICROSCOPIC (REFLEX): RBC / HPF: >50 RBC/hpf (ref 0–5)

## 2024-10-19 LAB — CBC WITH DIFFERENTIAL/PLATELET
Abs Immature Granulocytes: 0.02 K/uL (ref 0.00–0.07)
Basophils Absolute: 0 K/uL (ref 0.0–0.1)
Basophils Relative: 0 %
Eosinophils Absolute: 0.1 K/uL (ref 0.0–0.5)
Eosinophils Relative: 2 %
HCT: 44.9 % (ref 36.0–46.0)
Hemoglobin: 14.4 g/dL (ref 12.0–15.0)
Immature Granulocytes: 0 %
Lymphocytes Relative: 53 %
Lymphs Abs: 3.1 K/uL (ref 0.7–4.0)
MCH: 26.4 pg (ref 26.0–34.0)
MCHC: 32.1 g/dL (ref 30.0–36.0)
MCV: 82.4 fL (ref 80.0–100.0)
Monocytes Absolute: 0.5 K/uL (ref 0.1–1.0)
Monocytes Relative: 9 %
Neutro Abs: 2.1 K/uL (ref 1.7–7.7)
Neutrophils Relative %: 36 %
Platelets: 370 K/uL (ref 150–400)
RBC: 5.45 MIL/uL — ABNORMAL HIGH (ref 3.87–5.11)
RDW: 14.9 % (ref 11.5–15.5)
WBC: 5.8 K/uL (ref 4.0–10.5)
nRBC: 0 % (ref 0.0–0.2)

## 2024-10-19 LAB — LIPASE, BLOOD: Lipase: 27 U/L (ref 11–51)

## 2024-10-19 MED ORDER — SODIUM CHLORIDE 0.9 % IV BOLUS
500.0000 mL | Freq: Once | INTRAVENOUS | Status: AC
  Administered 2024-10-19: 500 mL via INTRAVENOUS

## 2024-10-19 MED ORDER — HYDROMORPHONE HCL 1 MG/ML IJ SOLN: 0.5000 mg | Freq: Once | INTRAMUSCULAR | Status: DC

## 2024-10-19 MED ORDER — HYDROMORPHONE HCL 1 MG/ML IJ SOLN
1.0000 mg | Freq: Once | INTRAMUSCULAR | Status: AC
  Administered 2024-10-19: 1 mg via INTRAVENOUS
  Filled 2024-10-19: qty 1

## 2024-10-19 MED ORDER — OXYCODONE HCL 10 MG PO TABS: 5.0000 mg | ORAL_TABLET | ORAL | 0 refills | Status: AC | PRN

## 2024-10-19 MED ORDER — MORPHINE SULFATE (PF) 4 MG/ML IV SOLN
4.0000 mg | Freq: Once | INTRAVENOUS | Status: AC
  Administered 2024-10-19: 4 mg via INTRAVENOUS
  Filled 2024-10-19: qty 1

## 2024-10-19 MED ORDER — IOHEXOL 300 MG/ML  SOLN
100.0000 mL | Freq: Once | INTRAMUSCULAR | Status: AC | PRN
  Administered 2024-10-19: 100 mL via INTRAVENOUS

## 2024-10-19 MED ORDER — AMOXICILLIN-POT CLAVULANATE 875-125 MG PO TABS: 1.0000 | ORAL_TABLET | Freq: Two times a day (BID) | ORAL | 0 refills | Status: AC

## 2024-10-19 MED ORDER — ONDANSETRON HCL 4 MG/2ML IJ SOLN
4.0000 mg | Freq: Once | INTRAMUSCULAR | Status: AC
  Administered 2024-10-19: 4 mg via INTRAVENOUS
  Filled 2024-10-19: qty 2

## 2024-10-19 NOTE — Discharge Instructions (Addendum)
 CT scan was concerning for mild diverticulitis today.  I have started you on Augmentin  please take it twice daily for 6 days.  I have also sent you an a short course of pain management please take it if Tylenol  and ibuprofen do not help with the pain.  Please follow-up with your pain management doctor as soon as possible for refills of pain medication.  If you experience any concerning new or worsening symptoms please return to the emergency department for further evaluation.

## 2024-10-19 NOTE — ED Notes (Signed)

## 2024-10-19 NOTE — ED Notes (Signed)
 Patient transported to MRI

## 2024-10-19 NOTE — ED Provider Notes (Signed)
 " [place] [facility] Provider Note   CSN: [id number] Arrival date & time: 10/19/24  1235     Patient presents with: Abdominal Pain and Hematuria   Erin Wolf is a 68 y.o. female.  68 year old female presents to the emergency department with abdominal pain, back pain, nausea, vomiting, diarrhea intermittent in nature since last week.  Patient reports right lower flank pain and left lower abdominal pain.  Patient reports she went to [place] for Christmas to visit family.  On her return the airline lost her luggage and therefore lost her medications.  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  Patient also endorses some hematuria x 1 day without other urinary symptoms.  Patient is concerned she has recurrent diverticulitis due to diarrhea and left lower quadrant pain.  She advises she came to the emergency department 3 days ago for similar symptoms and was provided with pain management while she waited for follow-up with her PCP and pain management doctor.  She advises she sees her pain management doc on the second and has attempted to follow-up with PCP to refill other medications but has not been able to establish an appointment with them due to vacation and a another doctor is supposed to be calling her back today to help fulfill the request.     Prior to Admission medications  Medication Sig Start Date End Date Taking? Authorizing Provider  amoxicillin -clavulanate (AUGMENTIN ) 875-125 MG tablet Take 1 tablet by mouth every 12 (twelve) hours. 10/19/24  Yes Myriam Fonda RAMAN, PA-C  oxyCODONE  10 MG TABS Take 0.5 tablets (5 mg total) by mouth every 4 (four) hours as needed for severe pain (pain score 7-10). 10/19/24  Yes Myriam Fonda RAMAN, PA-C  acetaminophen  (TYLENOL ) 500 MG tablet Take 1,000 mg by mouth every 6 (six) hours as needed for headache, fever or mild pain  (pain score 1-3).    [provider]  amoxicillin -clavulanate (AUGMENTIN ) 875-125 MG tablet Take 1 tablet by mouth every 12 (twelve) hours. 09/12/24   White, Elizabeth A, PA-C  dicyclomine  (BENTYL ) 10 MG capsule Take 1 capsule (10 mg total) by mouth 4 (four) times daily -  before meals and at bedtime. 10/16/24   Flint Sonny POUR, PA-C  FLUoxetine  (PROZAC ) 20 MG capsule Take 20 mg by mouth daily.    [provider]  linaclotide  (LINZESS ) 72 MCG capsule Take 72 mcg by mouth daily as needed (constipation).    [provider]  losartan  (COZAAR ) 25 MG tablet Take 1 tablet (25 mg total) by mouth daily. 10/16/24 10/16/25  Sofia, Leslie K, PA-C  melatonin 1 MG TABS tablet Take 1 mg by mouth at bedtime as needed (for sleep).    [provider]  Multiple Vitamins-Minerals (CENTRUM SILVER WOMEN 50+) TABS Take 1 tablet by mouth daily with breakfast.    [provider]  ondansetron  (ZOFRAN -ODT) 4 MG disintegrating tablet Take 1 tablet (4 mg total) by mouth every 8 (eight) hours as needed for nausea or vomiting. 10/16/24   Sofia, Leslie K, PA-C  oxyCODONE  (ROXICODONE ) 5 MG immediate release tablet Take 1 tablet (5 mg total) by mouth every 8 (eight) hours as needed. 10/16/24 10/16/25  Flint Sonny POUR, PA-C  pantoprazole  (PROTONIX ) 20 MG tablet Take 1 tablet (20 mg total) by mouth daily. 09/16/24   Keith, Kayla N, PA-C  promethazine  (PHENERGAN ) 12.5 MG tablet Take 12.5 mg by mouth 3 (three) times daily as needed  for nausea or vomiting. 07/17/24   [provider]  REFRESH 1.4-0.6 % SOLN Place 1 drop into both eyes 3 (three) times daily as needed (for dryness).    [provider]  sucralfate  (CARAFATE ) 1 g tablet Take 1 tablet (1 g total) by mouth 4 (four) times daily -  with meals and at bedtime. 09/16/24   Francis Ileana SAILOR, PA-C  traMADol (ULTRAM) 50 MG tablet Take 50 mg by mouth every 6 (six) hours as needed (for pain not relieved by Tylenol ). 07/03/24    [provider]    Allergies: Acetaminophen , Bupropion hcl, Celebrex [celecoxib], Ciprofloxacin , Eszopiclone, Ibuprofen, Ketorolac  tromethamine , Latex, Neurontin [gabapentin], Nsaids, Other, Penicillins, Bayer aspirin  [aspirin ], Darvocet [propoxyphene n-acetaminophen ], Tape, Cipro  [ciprofloxacin  hcl], Fioricet [butalbital-apap-caffeine], Ozempic (0.25 or 0.5 mg-dose) [semaglutide(0.25 or 0.5mg -dos)], Topiramate, and Wound dressing adhesive    Review of Systems  Gastrointestinal:  Positive for abdominal pain, diarrhea, nausea and vomiting.  Genitourinary:  Positive for hematuria.  All other systems reviewed and are negative.   Updated Vital Signs BP 133/75   Pulse 95   Temp 97.7 F (36.5 C) (Oral)   Resp 18   Ht 5' 4 (1.626 m)   Wt 99.8 kg   SpO2 100%   BMI 37.76 kg/m   Physical Exam Vitals and nursing note reviewed.  Constitutional:      General: She is not in acute distress.    Appearance: Normal appearance. She is well-developed. She is not ill-appearing.  HENT:     Head: Normocephalic and atraumatic.     Nose: Nose normal.     Mouth/Throat:     Mouth: Mucous membranes are moist.  Eyes:     Extraocular Movements: Extraocular movements intact.     Conjunctiva/sclera: Conjunctivae normal.     Pupils: Pupils are equal, round, and reactive to light.  Cardiovascular:     Rate and Rhythm: Normal rate.  Pulmonary:     Effort: Pulmonary effort is normal. No respiratory distress.     Breath sounds: Normal breath sounds. No wheezing.  Abdominal:     General: Abdomen is flat. Bowel sounds are normal.     Palpations: Abdomen is soft.     Tenderness: There is abdominal tenderness in the right lower quadrant and left lower quadrant.  Musculoskeletal:        General: Normal range of motion.     Cervical back: Normal range of motion.  Skin:    General: Skin is warm.     Capillary Refill: Capillary refill takes less than 2 seconds.  Neurological:     General: No focal  deficit present.     Mental Status: She is alert.  Psychiatric:        Mood and Affect: Mood normal.        Behavior: Behavior normal.     (all labs ordered are listed, but only abnormal results are displayed) Labs Reviewed  URINALYSIS, ROUTINE W REFLEX MICROSCOPIC - Abnormal; Notable for the following components:      Result Value   APPearance CLOUDY (*)    Hgb urine dipstick LARGE (*)    Bilirubin Urine SMALL (*)    Ketones, ur 15 (*)    Protein, ur 100 (*)    Leukocytes,Ua TRACE (*)    All other components within normal limits  URINALYSIS, MICROSCOPIC (REFLEX) - Abnormal; Notable for the following components:   Bacteria, UA FEW (*)    All other components within normal limits  COMPREHENSIVE METABOLIC PANEL WITH GFR -  Abnormal; Notable for the following components:   Total Protein 8.5 (*)    All other components within normal limits  CBC WITH DIFFERENTIAL/PLATELET - Abnormal; Notable for the following components:   RBC 5.45 (*)    All other components within normal limits  URINE CULTURE  LIPASE, BLOOD  CBC WITH DIFFERENTIAL/PLATELET    EKG: None  Radiology: CT ABDOMEN PELVIS W CONTRAST Result Date: 10/19/2024 CLINICAL DATA:  Left lower quadrant abdominal pain. EXAM: CT ABDOMEN AND PELVIS WITH CONTRAST TECHNIQUE: Multidetector CT imaging of the abdomen and pelvis was performed using the standard protocol following bolus administration of intravenous contrast. RADIATION DOSE REDUCTION: This exam was performed according to the departmental dose-optimization program which includes automated exposure control, adjustment of the mA and/or kV according to patient size and/or use of iterative reconstruction technique. CONTRAST:  OMNIPAQUE  IOHEXOL  300 MG/ML  SOLN COMPARISON:  CT abdomen pelvis dated 09/13/2024. FINDINGS: Lower chest: The visualized lung bases are clear. No intra-abdominal free air or free fluid. Hepatobiliary: The liver is unremarkable. Dilated biliary trees and  common bile duct similar or slightly progressed, post cholecystectomy. No retained calcified stone noted in the central CBD. Pancreas: There is dilatation of the main pancreatic duct as seen on the prior CT. No active inflammatory changes. Spleen: Normal in size without focal abnormality. Adrenals/Urinary Tract: Small left adrenal myelolipoma. The right adrenal glands unremarkable. There is no hydronephrosis on either side. There is symmetric enhancement and excretion of contrast by both kidneys. The visualized ureters appear unremarkable. The urinary bladder is poorly visualized due to streak artifact caused by left hip arthroplasty. Stomach/Bowel: There is sigmoid diverticulosis. Faint area of stranding adjacent to the distal descending colon (67/2 and coronal 79/8) concerning for mild acute diverticulitis. No abscess or perforation. There is no bowel obstruction. The appendix is normal. Vascular/Lymphatic: The abdominal aorta and IVC are unremarkable. No portal venous gas. There is no adenopathy. Reproductive: Hysterectomy.  No suspicious adnexal masses. Other: None Musculoskeletal: Total left hip arthroplasty. No acute osseous pathology. IMPRESSION: 1. Mild acute diverticulitis of the distal descending colon. No abscess or perforation. 2. No bowel obstruction. Normal appendix. 3. Dilated biliary tree and common bile duct similar or slightly progressed, post cholecystectomy. Electronically Signed   By: Vanetta Chou M.D.   On: 10/19/2024 19:35     Procedures   Medications Ordered in the ED  sodium chloride  0.9 % bolus 500 mL (0 mLs Intravenous Stopped 10/19/24 1738)  ondansetron  (ZOFRAN ) injection 4 mg (4 mg Intravenous Given 10/19/24 1704)  morphine  (PF) 4 MG/ML injection 4 mg (4 mg Intravenous Given 10/19/24 1702)  HYDROmorphone  (DILAUDID ) injection 1 mg (1 mg Intravenous Given 10/19/24 1729)  iohexol  (OMNIPAQUE ) 300 MG/ML solution 100 mL (100 mLs Intravenous Contrast Given 10/19/24 1755)    68  y.o. female presents to the ED with complaints of left lower quadrant abdominal pain, right flank pain, nausea, vomiting, diarrhea and hematuria, The differential diagnosis includes but not limited to diverticulitis, appendicitis, UTI, pyelonephritis, kidney stone, arthritic pain, pancreatitis (Ddx)  On arrival pt is nontoxic, vitals unremarkable. Exam significant for left and right lower quadrant pain with associated right flank pain.  I ordered medication morphine , Zofran , Dilaudid  for pain and nausea.  Lab Tests:  CMP lipase CBC with differential UA.  UA showed evidence of UTI with trace leukocytes, 21-50 white blood cells and large amount of hemoglobin.  CMP CBC and lipase unremarkable  Imaging Studies ordered:  I ordered imaging studies which included CT abdomen pelvis  with contrast was significant for mild acute diverticulitis of the distal descending colon with no abscess or perforation.  Dilated biliary tree with common bile duct slightly progressed, post cholecystectomy.  ED Course:   With significant pain to palpation throughout left lower quadrant and right lower quadrant CT abdomen pelvis will be ordered for evaluation.  Patient does not have her prescribed pain management at home and is requesting pain management here due to significant pain in her abdomen and joints.  Patient will be started on morphine .  Patient reported morphine  did not help and patient will be trialed on Dilaudid  at this time.  Dilaudid  controlled all pain.  Patient denies any dysuria other than her recent hematuria.  With evidence of acute diverticulitis on CT scan patient will be started on Augmentin  and urine will be sent for culture.  On reassessment patient is sitting comfortably in no pain.  Patient was advised of strict return precautions.  She was also advised to follow-up with PCP and pain management doctor to refill all medications.  Patient agreed with treatment plan and was comfortable discharge at  this time and reports she has a follow-up on 2 January and PCP should be calling her back tomorrow.   Portions of this note were generated with Scientist, clinical (histocompatibility and immunogenetics). Dictation errors may occur despite best attempts at proofreading.   Final diagnoses:  Diverticulitis    ED Discharge Orders          Ordered    amoxicillin -clavulanate (AUGMENTIN ) 875-125 MG tablet  Every 12 hours        10/19/24 2006    oxyCODONE  10 MG TABS  Every 4 hours PRN        10/19/24 2006               Myriam Fonda RAMAN, [place] 10/19/24 2133  "

## 2024-10-19 NOTE — ED Notes (Signed)
 Pt with difficult IV access. RN Stacy assessed pt and did not attempt access. RN Darryle attempted x 2 without success. This RN attempted x 2 with US  guided IV. 3rd attempt was successful with 2.5 US  guided IV. Pt tearful and states that she wishes that she had not come here and stayed home and suffered. Wants to speak to the PA. PA now aware and at bedside.

## 2024-10-19 NOTE — ED Triage Notes (Signed)
 Pt c/o recurrent abd and back pain since luggage lost last week, hematuria x 1 day. Denies fever.   + emesis after eating.  Took tylenol  appx 0300 today.

## 2024-10-19 NOTE — ED Notes (Signed)
 Patient transported to CT

## 2024-10-21 LAB — URINE CULTURE

## 2024-11-05 ENCOUNTER — Ambulatory Visit: Admitting: Family Medicine

## 2024-11-12 ENCOUNTER — Encounter: Payer: Self-pay | Admitting: Emergency Medicine

## 2024-11-12 ENCOUNTER — Ambulatory Visit: Admission: EM | Admit: 2024-11-12 | Discharge: 2024-11-12 | Disposition: A | Discharge status: Home / Self Care

## 2024-11-12 DIAGNOSIS — M5431 Sciatica, right side: Secondary | ICD-10-CM | POA: Diagnosis not present

## 2024-11-12 MED ORDER — BACLOFEN 10 MG PO TABS: 10.0000 mg | ORAL_TABLET | Freq: Three times a day (TID) | ORAL | 0 refills | Status: AC

## 2024-11-12 NOTE — ED Provider Notes (Signed)
 " [name]    CSN: [id number] Arrival date & time: 11/12/24  1436      History   Chief Complaint Chief Complaint  Patient presents with   Hip Pain   Back Pain    HPI Erin Wolf is a 69 y.o. female.   Pt presents today due to flair of right sided sciatica. Pt states that she is scheduled to have right hip replaced. Pt states that she has tried Tylenol  and ibuprofen for symptoms without relief of symptoms. Pt states that she usually needs a muscle relaxer for these symptoms. Pt states that she is experiencing 10/10 pain in right hip that radiates down to right foot.   The history is provided by the patient.  Hip Pain  Back Pain   Past Medical History:  Diagnosis Date   Anxiety    Arthritis    Chronic abdominal pain    Chronic lower back pain    Chronic nausea    Depression    Diabetes mellitus without complication (HCC)    not on meds   Diverticulosis    Dyspnea    with exertion   Fibroids 01/03/2012   Frequency of urination    GERD (gastroesophageal reflux disease)    Headache    Hematuria    Hepatitis    pt denies   Hepatitis C antibody positive in blood    per pt told by pcp   History of panic attacks    History of syncope    hx recurrent syncope --- per epic domentation non-cardiac , orthostatic hypotension, anxiety, dehydration   History of uterine fibroid    HTN (hypertension), benign    Hyperlipidemia 01/03/2012   IBS (irritable bowel syndrome)    Myalgia    Sciatic nerve disease, left 2023   Seizure (HCC) 01/03/2012   Seizure disorder (HCC) followed by pcp until new neurologist appr. in jan 2019   started having them in my 20's ,  petit mal ----  per pt last seizure one 2015   SLE (systemic lupus erythematosus) (HCC)    rheumatologist-- dr thamas (consult 11-12-2016) having work-up done   Ulcerative colitis    followed by dr lennard at [place]   Wears glasses     Patient Active Problem List   Diagnosis Date Noted    Knee pain 09/12/2024   Fatigue 09/12/2024   Hepatitis C antibody detected 09/12/2024   Osteoarthritis 09/12/2024   Panic disorder 09/12/2024   Restless legs syndrome 09/12/2024   Anxiety 09/12/2024   Insomnia 09/12/2024   Chronic pain syndrome 09/12/2024   Chronic migraine without aura 09/12/2024   Obesity 09/12/2024   Type 2 diabetes mellitus (HCC) 09/12/2024   Seizure disorder (HCC) 09/12/2024   Syncope 09/12/2024   Hypertensive disorder 09/12/2024   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   Colitis 05/23/2024   Class II obesity 04/28/2024   Anxiety 04/27/2024   Diverticulitis 03/31/2024   Acute diverticulitis 03/30/2024   Lower GI bleed 03/30/2024   DM (diabetes mellitus), type 2 (HCC) 03/30/2024   Abdominal pain, acute, left lower quadrant 11/19/2023   Lumbar spondylosis 03/03/2023   Pain of lumbar spine 03/03/2023   Spine pain, lumbar 03/03/2023   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   Osteoarthritis of left hip 10/31/2022   Pain of left hip joint 10/24/2022   Arthralgia of left knee 09/01/2022   Abnormal laboratory test result 04/16/2022   Chronic migraine w/o aura w/o status migrainosus, not intractable 04/15/2022   Chronic insomnia  04/15/2022   Arthralgia of right knee 04/02/2021   S/P total knee arthroplasty, right 03/27/2021   Status post total left knee replacement 12/19/2020   Chronic abdominal pain 05/05/2020   Chronic pain of left knee    Osteoarthritis of left knee 04/08/2019   Pain in left knee 02/16/2019   Chronic migraine 11/10/2018   Transformed migraine 11/10/2018   Drug therapy 11/12/2016   Inflammatory arthritis 11/12/2016   Myalgia 11/12/2016   Pain in joint involving multiple sites 11/12/2016   Nausea and vomiting 10/18/2016   History of ulcerative colitis    Left lower quadrant pain 10/17/2016   Syncope and collapse 06/29/2016   Left hemiparesis (HCC) 06/29/2016   Facial twitching 06/29/2016   Irritable bowel syndrome 06/29/2016   Facial myokymia  06/29/2016   Faintness    Dehydration 05/09/2014   GERD (gastroesophageal reflux disease) 05/09/2014   Gastroenteritis 05/09/2014   Chest pain, atypical 05/09/2014   Chronic ulcerative colitis (HCC) 05/09/2014   Acute gastroenteritis 05/09/2014   Atypical chest pain 05/09/2014   Gastroesophageal reflux disease 05/09/2014   Ulcerative colitis (HCC) 05/27/2013   Hypokalemia 05/27/2013   Anemia 01/04/2012   Transaminitis 01/04/2012   Diarrhea 01/04/2012   Enzyme disorder 01/04/2012   Chest pain 01/03/2012   Dyspnea 01/03/2012   HTN (hypertension), benign 01/03/2012   Hx of migraines 01/03/2012   Depression 01/03/2012   Ulcerative colitis (HCC) 01/03/2012   Chronic back pain 01/03/2012   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   Hyperlipidemia 01/03/2012   Hepatitis C 01/03/2012   Depressive disorder 01/03/2012   Dysthymia 01/03/2012   Leiomyoma 01/03/2012   Viral hepatitis C 01/03/2012   Benign hypertension 01/03/2012   History of migraine 01/03/2012    Past Surgical History:  Procedure Laterality Date   ABDOMINAL HYSTERECTOMY  1988   ARTERY BIOPSY Left 06/11/2022   Procedure: LEFT BIOPSY TEMPORAL ARTERY;  Surgeon: Stevie Herlene Righter, MD;  Location: [facility];  Service: General;  Laterality: Left;   CHOLECYSTECTOMY OPEN  1978   COLONOSCOPY N/A 05/31/2013   Procedure: COLONOSCOPY;  Surgeon: Lesta JULIANNA Fitz, MD;  Location: [facility];  Service: Endoscopy;  Laterality: N/A;   CYSTOSCOPY/RETROGRADE/URETEROSCOPY Bilateral 07/23/2017   Procedure: CYSTOSCOPY/RETROGRADE/URETEROSCOPY;  Surgeon: Devere Lonni Righter, MD;  Location: [facility];  Service: Urology;  Laterality: Bilateral;   detached retina on right eye surgery      ESOPHAGOGASTRODUODENOSCOPY (EGD) WITH PROPOFOL  N/A 05/11/2014   Procedure: ESOPHAGOGASTRODUODENOSCOPY (EGD) WITH PROPOFOL ;  Surgeon: Jerrell KYM Sol, MD;  Location: [facility];  Service: Endoscopy;  Laterality: N/A;  egd first    ESOPHAGOGASTRODUODENOSCOPY (EGD) WITH PROPOFOL  N/A 05/08/2020   Procedure: ESOPHAGOGASTRODUODENOSCOPY (EGD) WITH PROPOFOL ;  Surgeon: Burnette Fallow, MD;  Location: [facility];  Service: Endoscopy;  Laterality: N/A;   EUS N/A 06/21/2016   Procedure: ESOPHAGEAL ENDOSCOPIC ULTRASOUND (EUS) RADIAL;  Surgeon: Fallow Burnette, MD;  Location: [facility];  Service: Endoscopy;  Laterality: N/A;   FLEXIBLE SIGMOIDOSCOPY N/A 05/11/2014   Procedure: FLEXIBLE SIGMOIDOSCOPY;  Surgeon: Jerrell KYM Sol, MD;  Location: [facility];  Service: Endoscopy;  Laterality: N/A;   LAPAROTOMY W/ BILATERAL SALPINGOOPHORECTOMY  09-26-2003   dr johnnye at [facility]   LAPROSCOPY W/ LYSIS ADHESIONS  06-08-2003   dr leva [facility]   TOTAL HIP ARTHROPLASTY Left 12/03/2022   Procedure: TOTAL HIP ARTHROPLASTY ANTERIOR APPROACH;  Surgeon: Ernie Cough, MD;  Location: [facility];  Service: Orthopedics;  Laterality: Left;   TOTAL KNEE ARTHROPLASTY Left 12/19/2020   Procedure: TOTAL KNEE ARTHROPLASTY;  Surgeon: Ernie Cough,  MD;  Location: [facility];  Service: Orthopedics;  Laterality: Left;  70 mins   TOTAL KNEE ARTHROPLASTY Right 03/27/2021   Procedure: TOTAL KNEE ARTHROPLASTY;  Surgeon: Ernie Cough, MD;  Location: [facility];  Service: Orthopedics;  Laterality: Right;  70 mins   TRANSTHORACIC ECHOCARDIOGRAM  04/29/2016   ef 60-65%,  grade 2 diastolic dysfunction/  trivial MR and TR   TUBAL LIGATION Bilateral yrs ago   UPPER ESOPHAGEAL ENDOSCOPIC ULTRASOUND (EUS) N/A 05/08/2020   Procedure: UPPER ESOPHAGEAL ENDOSCOPIC ULTRASOUND (EUS);  Surgeon: Burnette Fallow, MD;  Location: [facility];  Service: Endoscopy;  Laterality: N/A;    OB History   No obstetric history on file.      Home Medications    Prior to Admission medications  Medication Sig Start Date End Date Taking? Authorizing Provider  acetaminophen  (TYLENOL ) 500 MG tablet Take 1,000 mg by mouth every 6 (six) hours as needed for headache, fever or mild pain (pain score 1-3).    Yes [provider]  amoxicillin -clavulanate (AUGMENTIN ) 875-125 MG tablet Take 1 tablet by mouth every 12 (twelve) hours. 10/19/24  Yes Myriam Fonda RAMAN, PA-C  atorvastatin  (LIPITOR) 20 MG tablet Take 20 mg by mouth daily. 11/12/24  Yes [provider]  baclofen (LIORESAL) 10 MG tablet Take 1 tablet (10 mg total) by mouth 3 (three) times daily. 11/12/24  Yes Andra Krabbe C, PA-C  Blood Glucose Monitoring Suppl (TRUE METRIX AIR GLUCOSE METER) w/Device KIT  09/20/24  Yes [provider]  FLUoxetine  (PROZAC ) 20 MG capsule Take 20 mg by mouth daily.   Yes [provider]  Multiple Vitamins-Minerals (CENTRUM SILVER WOMEN 50+) TABS Take 1 tablet by mouth daily with breakfast.   Yes [provider]  ofloxacin (OCUFLOX) 0.3 % ophthalmic solution Place 1 drop into the right eye 4 (four) times daily. 10/01/24  Yes [provider]  OZEMPIC, 1 MG/DOSE, 4 MG/3ML SOPN INJECT 1 MG UNDER THE SKIN ONCE WEEKLY   Yes [provider]  pantoprazole  (PROTONIX ) 20 MG tablet Take 1 tablet (20 mg total) by mouth daily. 09/16/24  Yes Keith, Kayla N, PA-C  prednisoLONE  acetate (PRED FORTE ) 1 % ophthalmic suspension Place 1 drop into the right eye 4 (four) times daily.   Yes [provider]  REFRESH 1.4-0.6 % SOLN Place 1 drop into both eyes 3 (three) times daily as needed (for dryness).   Yes [provider]  TRUE METRIX BLOOD GLUCOSE TEST test strip SMARTSIG:Via Meter 09/20/24  Yes [provider]  TRUEplus Lancets 33G MISC SMARTSIG:Lancet Topical 09/20/24  Yes [provider]  amoxicillin -clavulanate (AUGMENTIN ) 875-125 MG tablet Take 1 tablet by mouth every 12 (twelve) hours. Patient not taking: Reported on 11/12/2024 09/12/24   Teresa Almarie LABOR, PA-C  diazepam  (VALIUM ) 5 MG tablet SMARTSIG:1 Tablet(s) By Mouth Patient not taking: Reported on 11/12/2024 09/23/24   [provider]  dicyclomine  (BENTYL ) 10 MG  capsule Take 1 capsule (10 mg total) by mouth 4 (four) times daily -  before meals and at bedtime. Patient not taking: Reported on 11/12/2024 10/16/24   Sofia, Leslie K, PA-C  linaclotide  (LINZESS ) 72 MCG capsule Take 72 mcg by mouth daily as needed (constipation). Patient not taking: Reported on 11/12/2024    [provider]  losartan  (COZAAR ) 25 MG tablet Take 1 tablet (25 mg total) by mouth daily. Patient not taking: Reported on 11/12/2024 10/16/24 10/16/25  Sofia, Leslie K, PA-C  melatonin 1 MG TABS tablet Take 1 mg by mouth at  bedtime as needed (for sleep). Patient not taking: Reported on 11/12/2024    [provider]  ondansetron  (ZOFRAN -ODT) 4 MG disintegrating tablet Take 1 tablet (4 mg total) by mouth every 8 (eight) hours as needed for nausea or vomiting. Patient not taking: Reported on 11/12/2024 10/16/24   Flint Sonny POUR, PA-C  oxyCODONE  (ROXICODONE ) 5 MG immediate release tablet Take 1 tablet (5 mg total) by mouth every 8 (eight) hours as needed. 10/16/24 10/16/25  Flint Sonny POUR, PA-C  oxyCODONE  10 MG TABS Take 0.5 tablets (5 mg total) by mouth every 4 (four) hours as needed for severe pain (pain score 7-10). 10/19/24   Bundy, Joshua S, PA-C  Oxycodone  HCl 20 MG TABS Take 1 tablet by mouth 4 (four) times daily as needed. Patient not taking: Reported on 11/12/2024 10/22/24   [provider]  promethazine  (PHENERGAN ) 12.5 MG tablet Take 12.5 mg by mouth 3 (three) times daily as needed for nausea or vomiting. Patient not taking: Reported on 11/12/2024 07/17/24   [provider]  sucralfate  (CARAFATE ) 1 g tablet Take 1 tablet (1 g total) by mouth 4 (four) times daily -  with meals and at bedtime. Patient not taking: Reported on 11/12/2024 09/16/24   Francis Ileana SAILOR, PA-C  traMADol (ULTRAM) 50 MG tablet Take 50 mg by mouth every 6 (six) hours as needed (for pain not relieved by Tylenol ). Patient not taking: Reported on 11/12/2024 07/03/24   [provider]     Family History Family History  Problem Relation Age of Onset   Alzheimer's disease Mother    Hypertension Mother    Other Father        ruptured appendix   Breast cancer Sister    Hypertension Sister    Hypertension Sister    Hypertension Sister    Heart disease Sister    Colon cancer Brother    Hypertension Brother    Cancer Brother    Memory loss Brother     Social History Social History[1]   Allergies   Acetaminophen , Aspirin , Bupropion hcl, Celebrex [celecoxib], Ciprofloxacin , Eszopiclone, Ibuprofen, Ketorolac  tromethamine , Latex, Neurontin [gabapentin], Nsaids, Other, Penicillins, Darvocet [propoxyphene n-acetaminophen ], Tape, Butalbital-apap-caffeine, Cipro  [ciprofloxacin  hcl], Etodolac, Ozempic (0.25 or 0.5 mg-dose) [semaglutide(0.25 or 0.5mg -dos)], Topiramate, and Wound dressing adhesive   Review of Systems Review of Systems  Musculoskeletal:  Positive for back pain.     Physical Exam Triage Vital Signs ED Triage Vitals [11/12/24 1538]  Encounter Vitals Group     BP 99/70     Girls Systolic BP Percentile      Girls Diastolic BP Percentile      Boys Systolic BP Percentile      Boys Diastolic BP Percentile      Pulse Rate (!) 107     Resp 16     Temp 97.7 F (36.5 C)     Temp Source Oral     SpO2 97 %     Weight      Height      Head Circumference      Peak Flow      Pain Score 10     Pain Loc      Pain Education      Exclude from Growth Chart    No data found.  Updated Vital Signs BP 99/70 (BP Location: Left Arm)   Pulse (!) 107   Temp 97.7 F (36.5 C) (Oral)   Resp 16   SpO2 97%   Visual Acuity Right Eye Distance:  Left Eye Distance:   Bilateral Distance:    Right Eye Near:   Left Eye Near:    Bilateral Near:     Physical Exam Vitals and nursing note reviewed.  Constitutional:      General: She is not in acute distress.    Appearance: Normal appearance. She is not ill-appearing, toxic-appearing or diaphoretic.  Eyes:      General: No scleral icterus. Cardiovascular:     Rate and Rhythm: Normal rate and regular rhythm.     Heart sounds: Normal heart sounds.  Pulmonary:     Effort: Pulmonary effort is normal. No respiratory distress.     Breath sounds: Normal breath sounds. No wheezing or rhonchi.  Skin:    General: Skin is warm.  Neurological:     Mental Status: She is alert and oriented to person, place, and time.  Psychiatric:        Mood and Affect: Mood normal.        Behavior: Behavior normal.      UC Treatments / Results  Labs (all labs ordered are listed, but only abnormal results are displayed) Labs Reviewed - No data to display  EKG   Radiology No results found.  Procedures Procedures (including critical care time)  Medications Ordered in UC Medications - No data to display  Initial Impression / Assessment and Plan / UC Course  I have reviewed the triage vital signs and the nursing notes.  Pertinent labs & imaging results that were available during my care of the patient were reviewed by me and considered in my medical decision making (see chart for details).     Final Clinical Impressions(s) / UC Diagnoses   Final diagnoses:  Sciatica of right side     Discharge Instructions      I have sent baclofen to the pharmacy for your symptoms. I do not feel comfortable prescribing a narcotic medication for your pain at this time.    ED Prescriptions     Medication Sig Dispense Auth. Provider   baclofen (LIORESAL) 10 MG tablet Take 1 tablet (10 mg total) by mouth 3 (three) times daily. 30 each Andra Corean BROCKS, PA-C      I have reviewed the PDMP during this encounter.     [1]  Social History Tobacco Use   Smoking status: Never    Passive exposure: Never   Smokeless tobacco: Never  Vaping Use   Vaping status: Never Used  Substance Use Topics   Alcohol  use: Never    Comment: occ   Drug use: No     Andra Corean BROCKS, PA-C 11/12/24 1822  "

## 2024-11-12 NOTE — Discharge Instructions (Signed)
 I have sent baclofen to the pharmacy for your symptoms. I do not feel comfortable prescribing a narcotic medication for your pain at this time.

## 2024-11-12 NOTE — ED Triage Notes (Addendum)
 Pt reports R-sided hip and back pain x3 days. Denies injuries, falls, or trauma to area. Believes she is having a sciatica flare up - she is also due for surgery soon on R hip (replacement). Pt could not get into primary care office - last flare was 1.5 months ago. Pt reports usually needed flexeril  and something for pain when this happens. Took ibuprofen and tylenol  with no relief. Heating pad has given some relief.

## 2024-11-13 ENCOUNTER — Telehealth: Payer: Self-pay | Admitting: *Deleted

## 2024-11-13 ENCOUNTER — Telehealth: Payer: Self-pay

## 2024-11-13 ENCOUNTER — Ambulatory Visit: Payer: Self-pay

## 2024-11-13 MED ORDER — CYCLOBENZAPRINE HCL 5 MG PO TABS: 5.0000 mg | ORAL_TABLET | Freq: Three times a day (TID) | ORAL | 0 refills | Status: AC | PRN

## 2024-11-13 NOTE — Telephone Encounter (Signed)
 Message from pt access:  MRN [id number] called about being prescribed meds yesterday, not working for her, would like something else. Has an appt at 1pm, if someone isnt able to call by then she's just come in.   Chart reviewed by Corean Endo, PA-C who was her provider yesterday. She advises she is unable to prescribe any other medication due to pt's allergies and is not able to prescribe narcotics as pt requested yesterday.  I spoke to Ms. Vogl who states she requested Flexeril  and the Baclofen  that was prescribed made her crazy. Provider made aware. States yesterday she did not send the Flexeril  because it seemed it was not covered by [employer], but she would send it in now to the pharmacy on the chart. Pt made aware and verbalizes understanding.

## 2024-11-13 NOTE — Telephone Encounter (Signed)
 Sent flexeril , baclofen  was sent to pharmacy yesterday but states that med made her crazy

## 2024-11-15 ENCOUNTER — Emergency Department (HOSPITAL_COMMUNITY)

## 2024-11-15 ENCOUNTER — Encounter (HOSPITAL_COMMUNITY): Payer: Self-pay

## 2024-11-15 ENCOUNTER — Other Ambulatory Visit: Payer: Self-pay

## 2024-11-15 ENCOUNTER — Emergency Department (HOSPITAL_COMMUNITY)
Admission: EM | Admit: 2024-11-15 | Discharge: 2024-11-16 | Disposition: A | Attending: Emergency Medicine | Admitting: Emergency Medicine

## 2024-11-15 DIAGNOSIS — R112 Nausea with vomiting, unspecified: Secondary | ICD-10-CM | POA: Diagnosis not present

## 2024-11-15 DIAGNOSIS — R1032 Left lower quadrant pain: Secondary | ICD-10-CM | POA: Insufficient documentation

## 2024-11-15 DIAGNOSIS — R197 Diarrhea, unspecified: Secondary | ICD-10-CM | POA: Insufficient documentation

## 2024-11-15 DIAGNOSIS — E119 Type 2 diabetes mellitus without complications: Secondary | ICD-10-CM | POA: Insufficient documentation

## 2024-11-15 DIAGNOSIS — R1012 Left upper quadrant pain: Secondary | ICD-10-CM | POA: Diagnosis not present

## 2024-11-15 DIAGNOSIS — R109 Unspecified abdominal pain: Secondary | ICD-10-CM | POA: Diagnosis present

## 2024-11-15 DIAGNOSIS — I10 Essential (primary) hypertension: Secondary | ICD-10-CM | POA: Insufficient documentation

## 2024-11-15 DIAGNOSIS — R1033 Periumbilical pain: Secondary | ICD-10-CM | POA: Insufficient documentation

## 2024-11-15 LAB — CBC WITH DIFFERENTIAL/PLATELET
Abs Immature Granulocytes: 0.01 10*3/uL (ref 0.00–0.07)
Basophils Absolute: 0 10*3/uL (ref 0.0–0.1)
Basophils Relative: 0 %
Eosinophils Absolute: 0 10*3/uL (ref 0.0–0.5)
Eosinophils Relative: 0 %
HCT: 44.6 % (ref 36.0–46.0)
Hemoglobin: 14.2 g/dL (ref 12.0–15.0)
Immature Granulocytes: 0 %
Lymphocytes Relative: 45 %
Lymphs Abs: 2.6 10*3/uL (ref 0.7–4.0)
MCH: 26.8 pg (ref 26.0–34.0)
MCHC: 31.8 g/dL (ref 30.0–36.0)
MCV: 84.2 fL (ref 80.0–100.0)
Monocytes Absolute: 0.4 10*3/uL (ref 0.1–1.0)
Monocytes Relative: 7 %
Neutro Abs: 2.7 10*3/uL (ref 1.7–7.7)
Neutrophils Relative %: 48 %
Platelets: 304 10*3/uL (ref 150–400)
RBC: 5.3 MIL/uL — ABNORMAL HIGH (ref 3.87–5.11)
RDW: 14.9 % (ref 11.5–15.5)
WBC: 5.7 10*3/uL (ref 4.0–10.5)
nRBC: 0 % (ref 0.0–0.2)

## 2024-11-15 LAB — COMPREHENSIVE METABOLIC PANEL WITH GFR
ALT: 28 U/L (ref 0–44)
AST: 20 U/L (ref 15–41)
Albumin: 4.1 g/dL (ref 3.5–5.0)
Alkaline Phosphatase: 93 U/L (ref 38–126)
Anion gap: 13 (ref 5–15)
BUN: 14 mg/dL (ref 8–23)
CO2: 23 mmol/L (ref 22–32)
Calcium: 9.7 mg/dL (ref 8.9–10.3)
Chloride: 103 mmol/L (ref 98–111)
Creatinine, Ser: 0.76 mg/dL (ref 0.44–1.00)
GFR, Estimated: >60 mL/min
Glucose, Bld: 90 mg/dL (ref 70–99)
Potassium: 3.6 mmol/L (ref 3.5–5.1)
Sodium: 140 mmol/L (ref 135–145)
Total Bilirubin: 0.3 mg/dL (ref 0.0–1.2)
Total Protein: 7.8 g/dL (ref 6.5–8.1)

## 2024-11-15 LAB — URINALYSIS, ROUTINE W REFLEX MICROSCOPIC
Bilirubin Urine: NEGATIVE
Glucose, UA: NEGATIVE mg/dL
Hgb urine dipstick: NEGATIVE
Ketones, ur: NEGATIVE mg/dL
Nitrite: NEGATIVE
Protein, ur: NEGATIVE mg/dL
Specific Gravity, Urine: 1.029 (ref 1.005–1.030)
pH: 5 (ref 5.0–8.0)

## 2024-11-15 LAB — LIPASE, BLOOD: Lipase: 21 U/L (ref 11–51)

## 2024-11-15 MED ORDER — ONDANSETRON HCL 4 MG/2ML IJ SOLN
4.0000 mg | Freq: Once | INTRAMUSCULAR | Status: AC
  Administered 2024-11-15: 4 mg via INTRAVENOUS
  Filled 2024-11-15 (×2): qty 2

## 2024-11-15 MED ORDER — SODIUM CHLORIDE 0.9 % IV BOLUS
500.0000 mL | Freq: Once | INTRAVENOUS | Status: AC
  Administered 2024-11-15: 500 mL via INTRAVENOUS

## 2024-11-15 MED ORDER — HYDROMORPHONE HCL 1 MG/ML IJ SOLN
1.0000 mg | Freq: Once | INTRAMUSCULAR | Status: AC
  Administered 2024-11-15: 1 mg via INTRAMUSCULAR
  Filled 2024-11-15: qty 1

## 2024-11-15 MED ORDER — MORPHINE SULFATE (PF) 4 MG/ML IV SOLN
4.0000 mg | Freq: Once | INTRAVENOUS | Status: AC
  Administered 2024-11-15: 4 mg via INTRAVENOUS
  Filled 2024-11-15 (×2): qty 1

## 2024-11-15 NOTE — ED Provider Notes (Addendum)
 " [place] EMERGENCY DEPARTMENT AT [facility] Provider Note   CSN: [id number] Arrival date & time: 11/15/24  1526     Patient presents with: Abdominal Pain   Erin Wolf is a 69 y.o. female.   Patient is a 69 year old female who presents with abdominal pain.  She has had 2-day history abdominal pain and loose stools.  She has had a history of diverticulitis in the past and says it feels similar.  She says the pain is pretty intense.  She has had associated nausea and some vomiting.  She said she had a fever on Friday but none since then.  No urinary symptoms.       Prior to Admission medications  Medication Sig Start Date End Date Taking? Authorizing Provider  acetaminophen  (TYLENOL ) 500 MG tablet Take 1,000 mg by mouth every 6 (six) hours as needed for headache, fever or mild pain (pain score 1-3).    [provider]  amoxicillin -clavulanate (AUGMENTIN ) 875-125 MG tablet Take 1 tablet by mouth every 12 (twelve) hours. Patient not taking: Reported on 11/12/2024 09/12/24   Teresa Almarie LABOR, PA-C  amoxicillin -clavulanate (AUGMENTIN ) 875-125 MG tablet Take 1 tablet by mouth every 12 (twelve) hours. 10/19/24   Myriam Fonda RAMAN, PA-C  atorvastatin  (LIPITOR) 20 MG tablet Take 20 mg by mouth daily. 11/12/24   [provider]  baclofen  (LIORESAL ) 10 MG tablet Take 1 tablet (10 mg total) by mouth 3 (three) times daily. 11/12/24   Andra Corean BROCKS, PA-C  Blood Glucose Monitoring Suppl (TRUE METRIX AIR GLUCOSE METER) w/Device KIT  09/20/24   [provider]  cyclobenzaprine  (FLEXERIL ) 5 MG tablet Take 1 tablet (5 mg total) by mouth 3 (three) times daily as needed. 11/13/24   Andra Corean BROCKS, PA-C  diazepam  (VALIUM ) 5 MG tablet SMARTSIG:1 Tablet(s) By Mouth Patient not taking: Reported on 11/12/2024 09/23/24   [provider]  dicyclomine  (BENTYL ) 10 MG capsule Take 1 capsule (10 mg total) by mouth 4 (four) times daily -  before meals and  at bedtime. Patient not taking: Reported on 11/12/2024 10/16/24   Sofia, Leslie K, PA-C  FLUoxetine  (PROZAC ) 20 MG capsule Take 20 mg by mouth daily.    [provider]  linaclotide  (LINZESS ) 72 MCG capsule Take 72 mcg by mouth daily as needed (constipation). Patient not taking: Reported on 11/12/2024    [provider]  losartan  (COZAAR ) 25 MG tablet Take 1 tablet (25 mg total) by mouth daily. Patient not taking: Reported on 11/12/2024 10/16/24 10/16/25  Sofia, Leslie K, PA-C  melatonin 1 MG TABS tablet Take 1 mg by mouth at bedtime as needed (for sleep). Patient not taking: Reported on 11/12/2024    [provider]  Multiple Vitamins-Minerals (CENTRUM SILVER WOMEN 50+) TABS Take 1 tablet by mouth daily with breakfast.    [provider]  ofloxacin (OCUFLOX) 0.3 % ophthalmic solution Place 1 drop into the right eye 4 (four) times daily. 10/01/24   [provider]  ondansetron  (ZOFRAN -ODT) 4 MG disintegrating tablet Take 1 tablet (4 mg total) by mouth every 8 (eight) hours as needed for nausea or vomiting. Patient not taking: Reported on 11/12/2024 10/16/24   Flint Sonny POUR, PA-C  oxyCODONE  (ROXICODONE ) 5 MG immediate release tablet Take 1 tablet (5 mg total) by mouth every 8 (eight) hours as needed. 10/16/24 10/16/25  Flint Sonny POUR, PA-C  oxyCODONE  10 MG TABS Take 0.5 tablets (5 mg total) by mouth every 4 (four) hours as needed  for severe pain (pain score 7-10). 10/19/24   Bundy, Joshua S, PA-C  Oxycodone  HCl 20 MG TABS Take 1 tablet by mouth 4 (four) times daily as needed. Patient not taking: Reported on 11/12/2024 10/22/24   [provider]  OZEMPIC, 1 MG/DOSE, 4 MG/3ML SOPN INJECT 1 MG UNDER THE SKIN ONCE WEEKLY    [provider]  pantoprazole  (PROTONIX ) 20 MG tablet Take 1 tablet (20 mg total) by mouth daily. 09/16/24   Francis Ileana SAILOR, PA-C  prednisoLONE  acetate (PRED FORTE ) 1 % ophthalmic suspension Place 1 drop into the right eye 4  (four) times daily.    [provider]  promethazine  (PHENERGAN ) 12.5 MG tablet Take 12.5 mg by mouth 3 (three) times daily as needed for nausea or vomiting. Patient not taking: Reported on 11/12/2024 07/17/24   [provider]  REFRESH 1.4-0.6 % SOLN Place 1 drop into both eyes 3 (three) times daily as needed (for dryness).    [provider]  sucralfate  (CARAFATE ) 1 g tablet Take 1 tablet (1 g total) by mouth 4 (four) times daily -  with meals and at bedtime. Patient not taking: Reported on 11/12/2024 09/16/24   Francis Ileana SAILOR, PA-C  traMADol (ULTRAM) 50 MG tablet Take 50 mg by mouth every 6 (six) hours as needed (for pain not relieved by Tylenol ). Patient not taking: Reported on 11/12/2024 07/03/24   [provider]  TRUE METRIX BLOOD GLUCOSE TEST test strip SMARTSIG:Via Meter 09/20/24   [provider]  TRUEplus Lancets 33G MISC SMARTSIG:Lancet Topical 09/20/24   [provider]    Allergies: Acetaminophen , Aspirin , Bupropion hcl, Celebrex [celecoxib], Ciprofloxacin , Eszopiclone, Ibuprofen, Ketorolac  tromethamine , Latex, Neurontin [gabapentin], Nsaids, Other, Penicillins, Darvocet [propoxyphene n-acetaminophen ], Tape, Butalbital-apap-caffeine, Cipro  [ciprofloxacin  hcl], Etodolac, Ozempic (0.25 or 0.5 mg-dose) [semaglutide(0.25 or 0.5mg -dos)], Topiramate, and Wound dressing adhesive    Review of Systems  Constitutional:  Positive for fatigue and fever. Negative for chills and diaphoresis.  HENT:  Negative for congestion, rhinorrhea and sneezing.   Eyes: Negative.   Respiratory:  Negative for cough, chest tightness and shortness of breath.   Cardiovascular:  Negative for chest pain and leg swelling.  Gastrointestinal:  Positive for abdominal pain, diarrhea, nausea and vomiting.  Genitourinary:  Negative for difficulty urinating, flank pain and frequency.  Musculoskeletal:  Negative for arthralgias and back pain.  Skin:  Negative for rash.   Neurological:  Negative for dizziness, speech difficulty, weakness, numbness and headaches.    Updated Vital Signs BP (!) 169/113 (BP Location: Right Arm)   Pulse 97   Temp 98.5 F (36.9 C) (Oral)   Resp 18   SpO2 100%   Physical Exam Constitutional:      Appearance: She is well-developed.  HENT:     Head: Normocephalic and atraumatic.  Eyes:     Pupils: Pupils are equal, round, and reactive to light.  Cardiovascular:     Rate and Rhythm: Normal rate and regular rhythm.     Heart sounds: Normal heart sounds.  Pulmonary:     Effort: Pulmonary effort is normal. No respiratory distress.     Breath sounds: Normal breath sounds. No wheezing or rales.  Chest:     Chest wall: No tenderness.  Abdominal:     General: Bowel sounds are normal.     Palpations: Abdomen is soft.     Tenderness: There is abdominal tenderness in the periumbilical area, left upper quadrant and left lower quadrant. There is no guarding or rebound.  Musculoskeletal:  General: Normal range of motion.     Cervical back: Normal range of motion and neck supple.  Lymphadenopathy:     Cervical: No cervical adenopathy.  Skin:    General: Skin is warm and dry.     Findings: No rash.  Neurological:     Mental Status: She is alert and oriented to person, place, and time.     (all labs ordered are listed, but only abnormal results are displayed) Labs Reviewed  CBC WITH DIFFERENTIAL/PLATELET - Abnormal; Notable for the following components:      Result Value   RBC 5.30 (*)    All other components within normal limits  URINALYSIS, ROUTINE W REFLEX MICROSCOPIC - Abnormal; Notable for the following components:   APPearance HAZY (*)    Leukocytes,Ua SMALL (*)    Bacteria, UA RARE (*)    All other components within normal limits  COMPREHENSIVE METABOLIC PANEL WITH GFR  LIPASE, BLOOD    EKG: None  Radiology: CT ABDOMEN PELVIS WO CONTRAST Result Date: 11/15/2024 EXAM: CT ABDOMEN AND PELVIS WITHOUT  CONTRAST 11/15/2024 11:20:19 PM TECHNIQUE: CT of the abdomen and pelvis was performed without the administration of intravenous contrast. Multiplanar reformatted images are provided for review. Automated exposure control, iterative reconstruction, and/or weight-based adjustment of the mA/kV was utilized to reduce the radiation dose to as low as reasonably achievable. COMPARISON: None available. CLINICAL HISTORY: LLQ abdominal pain. Left lower quadrant abdominal pain. FINDINGS: LOWER CHEST: No acute abnormality. LIVER: The liver is unremarkable. GALLBLADDER AND BILE DUCTS: Gallbladder surgically removed. Common bile duct dilatation as well as central intrahepatic biliary ductal dilatation. These findings can be seen in the setting of cholecystectomy. SPLEEN: No acute abnormality. PANCREAS: No acute abnormality. ADRENAL GLANDS: No acute abnormality. KIDNEYS, URETERS AND BLADDER: No stones in the kidneys or ureters. No hydronephrosis. No perinephric or periureteral stranding. Urinary bladder is unremarkable. GI AND BOWEL: Stomach demonstrates no acute abnormality. No small or large bowel thickening or dilatation. The appendix is not definitely identified with no inflammatory changes in the right lower quadrant to suggest acute appendicitis. Colonic diverticulosis. There is no bowel obstruction. PERITONEUM AND RETROPERITONEUM: No ascites. No free air. VASCULATURE: Aorta is normal in caliber. LYMPH NODES: No lymphadenopathy. REPRODUCTIVE ORGANS: Status post hysterectomy. No adnexal mass. BONES AND SOFT TISSUES: Total left hip arthroplasty. No acute osseous abnormality. No focal soft tissue abnormality. IMPRESSION: 1. No acute CT abnormality in the abdomen or pelvis with limited evaluation of this non-contrast study. 2. Colonic diverticulosis with no acute diverticulitis. Electronically signed by: Morgane Naveau MD 11/15/2024 11:29 PM EST RP Workstation: [id number]     Procedures   Medications Ordered in the ED   HYDROmorphone  (DILAUDID ) injection 1 mg (has no administration in time range)  morphine  (PF) 4 MG/ML injection 4 mg (4 mg Intravenous Given 11/15/24 2304)  ondansetron  (ZOFRAN ) injection 4 mg (4 mg Intravenous Given 11/15/24 2304)  sodium chloride  0.9 % bolus 500 mL (0 mLs Intravenous Paused 11/15/24 2312)                                    Medical Decision Making Amount and/or Complexity of Data Reviewed Labs: ordered. Radiology: ordered.  Risk Prescription drug management.   This patient presents to the ED for concern of abdominal pain, this involves an extensive number of treatment options, and is a complaint that carries with it a high risk of complications and morbidity.  I considered the following differential and admission for this acute, potentially life threatening condition.  The differential diagnosis includes colitis, diverticulitis, kidney stone, pyelonephritis, bowel obstruction  MDM:    Patient is a 69 year old female who presents with left side abdominal pain and loose stools for the last 2 days.  She has a history of diverticulitis and says it feels similar.  She has had associated nausea and vomiting.  No fevers.  CBC shows a normal white count.  Electrolytes are nonconcerning.  LFTs are normal.  Lipase is normal.  Urine does not look to be concerning for infection.  CT scan does not show any acute abnormality.  She had a difficult IV access.  Ultrasound IV was placed but did not seem to be working.  Will give her a dose of IM Dilaudid .  If her pain is better, I suspect she can be discharged home and follow-up with her gastroenterologist at [facility].  Care turned over to Dr. Griselda.  According to the drug database, she gets chronic prescriptions for oxycodone  and tramadol so we would not want to give her any further prescriptions for pain management.  She can follow-up with her doctors at [facility] once we get her pain under control here.  (Labs,  imaging, consults)  Labs: I Ordered, and personally interpreted labs.  The pertinent results include: Normal WBC count, otherwise nonconcerning  Imaging Studies ordered: I ordered imaging studies including CT abdomen pelvis I independently visualized and interpreted imaging. I agree with the radiologist interpretation  Additional history obtained from  .  External records from outside source obtained and reviewed including prior notes  Cardiac Monitoring: The patient was not maintained on a cardiac monitor.  If on the cardiac monitor, I personally viewed and interpreted the cardiac monitored which showed an underlying rhythm of:    Reevaluation: After the interventions noted above, I reevaluated the patient and found that they have :improved  Social Determinants of Health:    Disposition: Pending  Co morbidities that complicate the patient evaluation  Past Medical History:  Diagnosis Date   Anxiety    Arthritis    Chronic abdominal pain    Chronic lower back pain    Chronic nausea    Depression    Diabetes mellitus without complication (HCC)    not on meds   Diverticulosis    Dyspnea    with exertion   Fibroids 01/03/2012   Frequency of urination    GERD (gastroesophageal reflux disease)    Headache    Hematuria    Hepatitis    pt denies   Hepatitis C antibody positive in blood    per pt told by pcp   History of panic attacks    History of syncope    hx recurrent syncope --- per epic domentation non-cardiac , orthostatic hypotension, anxiety, dehydration   History of uterine fibroid    HTN (hypertension), benign    Hyperlipidemia 01/03/2012   IBS (irritable bowel syndrome)    Myalgia    Sciatic nerve disease, left 2023   Seizure (HCC) 01/03/2012   Seizure disorder (HCC) followed by pcp until new neurologist appr. in jan 2019   started having them in my 20's ,  petit mal ----  per pt last seizure one 2015   SLE (systemic lupus erythematosus) [facility])     rheumatologist-- dr ziolkowska (consult 11-12-2016) having work-up done   Ulcerative colitis    followed by dr lennard at [place]   Wears  glasses      Medicines Meds ordered this encounter  Medications   morphine  (PF) 4 MG/ML injection 4 mg   ondansetron  (ZOFRAN ) injection 4 mg   sodium chloride  0.9 % bolus 500 mL   HYDROmorphone  (DILAUDID ) injection 1 mg    I have reviewed the patients home medicines and have made adjustments as needed  Problem List / ED Course: Problem List Items Addressed This Visit   None            Final diagnoses:  None    ED Discharge Orders     None          Lenor Hollering, MD 11/15/24 [id number]    Lenor Hollering, MD 11/15/24 7661    Lenor Hollering, MD 11/15/24 2339  "

## 2024-11-15 NOTE — ED Triage Notes (Addendum)
 Pt reports with left sided abdominal pain and cramping along with vomiting and diarrhea since Saturday. Pt reports a hx of colitis.

## 2024-11-16 MED ORDER — OXYCODONE HCL 5 MG PO TABS
5.0000 mg | ORAL_TABLET | Freq: Once | ORAL | Status: AC
  Administered 2024-11-16: 5 mg via ORAL
  Filled 2024-11-16: qty 1

## 2024-11-16 NOTE — ED Provider Notes (Signed)
 Care assumed at 2300.  Pt with chronic pain here with LLQ AP.  Work up reassuring.  Care assumed pending pain meds.    Pt has improvement in pain with meds in the ED, tolerating po. Discussed pcp follow up.     Griselda Norris, MD 11/16/24 [phone]

## 2024-11-17 ENCOUNTER — Other Ambulatory Visit: Payer: Self-pay

## 2024-11-17 ENCOUNTER — Encounter (HOSPITAL_BASED_OUTPATIENT_CLINIC_OR_DEPARTMENT_OTHER): Payer: Self-pay | Admitting: Emergency Medicine

## 2024-11-17 ENCOUNTER — Emergency Department (HOSPITAL_BASED_OUTPATIENT_CLINIC_OR_DEPARTMENT_OTHER)
Admission: EM | Admit: 2024-11-17 | Discharge: 2024-11-17 | Disposition: A | Discharge status: Home / Self Care | Attending: Emergency Medicine | Admitting: Emergency Medicine

## 2024-11-17 ENCOUNTER — Other Ambulatory Visit (HOSPITAL_BASED_OUTPATIENT_CLINIC_OR_DEPARTMENT_OTHER): Payer: Self-pay

## 2024-11-17 DIAGNOSIS — Z91040 Latex allergy status: Secondary | ICD-10-CM | POA: Diagnosis not present

## 2024-11-17 DIAGNOSIS — N3001 Acute cystitis with hematuria: Secondary | ICD-10-CM | POA: Diagnosis not present

## 2024-11-17 DIAGNOSIS — R109 Unspecified abdominal pain: Secondary | ICD-10-CM | POA: Diagnosis present

## 2024-11-17 LAB — CBC WITH DIFFERENTIAL/PLATELET
Abs Immature Granulocytes: 0.01 10*3/uL (ref 0.00–0.07)
Basophils Absolute: 0 10*3/uL (ref 0.0–0.1)
Basophils Relative: 0 %
Eosinophils Absolute: 0 10*3/uL (ref 0.0–0.5)
Eosinophils Relative: 0 %
HCT: 45.2 % (ref 36.0–46.0)
Hemoglobin: 14.3 g/dL (ref 12.0–15.0)
Immature Granulocytes: 0 %
Lymphocytes Relative: 50 %
Lymphs Abs: 2.4 10*3/uL (ref 0.7–4.0)
MCH: 26.3 pg (ref 26.0–34.0)
MCHC: 31.6 g/dL (ref 30.0–36.0)
MCV: 83.1 fL (ref 80.0–100.0)
Monocytes Absolute: 0.4 10*3/uL (ref 0.1–1.0)
Monocytes Relative: 9 %
Neutro Abs: 2 10*3/uL (ref 1.7–7.7)
Neutrophils Relative %: 41 %
Platelets: 312 10*3/uL (ref 150–400)
RBC: 5.44 MIL/uL — ABNORMAL HIGH (ref 3.87–5.11)
RDW: 15.1 % (ref 11.5–15.5)
WBC: 4.9 10*3/uL (ref 4.0–10.5)
nRBC: 0 % (ref 0.0–0.2)

## 2024-11-17 LAB — COMPREHENSIVE METABOLIC PANEL WITH GFR
ALT: 19 U/L (ref 0–44)
AST: 16 U/L (ref 15–41)
Albumin: 4.2 g/dL (ref 3.5–5.0)
Alkaline Phosphatase: 86 U/L (ref 38–126)
Anion gap: 11 (ref 5–15)
BUN: 12 mg/dL (ref 8–23)
CO2: 24 mmol/L (ref 22–32)
Calcium: 9.6 mg/dL (ref 8.9–10.3)
Chloride: 105 mmol/L (ref 98–111)
Creatinine, Ser: 0.58 mg/dL (ref 0.44–1.00)
GFR, Estimated: >60 mL/min
Glucose, Bld: 88 mg/dL (ref 70–99)
Potassium: 3.8 mmol/L (ref 3.5–5.1)
Sodium: 139 mmol/L (ref 135–145)
Total Bilirubin: 0.3 mg/dL (ref 0.0–1.2)
Total Protein: 7.7 g/dL (ref 6.5–8.1)

## 2024-11-17 LAB — URINALYSIS, MICROSCOPIC (REFLEX): RBC / HPF: >50 RBC/hpf (ref 0–5)

## 2024-11-17 LAB — URINALYSIS, ROUTINE W REFLEX MICROSCOPIC
Bilirubin Urine: NEGATIVE
Glucose, UA: NEGATIVE mg/dL
Ketones, ur: NEGATIVE mg/dL
Leukocytes,Ua: NEGATIVE
Nitrite: NEGATIVE
Protein, ur: 30 mg/dL — AB
Specific Gravity, Urine: >=1.03 (ref 1.005–1.030)
pH: 5.5 (ref 5.0–8.0)

## 2024-11-17 LAB — LIPASE, BLOOD: Lipase: 22 U/L (ref 11–51)

## 2024-11-17 MED ORDER — CEPHALEXIN 500 MG PO CAPS
500.0000 mg | ORAL_CAPSULE | Freq: Four times a day (QID) | ORAL | 0 refills | Status: AC
  Filled 2024-11-17: qty 28, 7d supply, fill #0

## 2024-11-17 MED ORDER — ONDANSETRON 4 MG PO TBDP
4.0000 mg | ORAL_TABLET | Freq: Once | ORAL | Status: AC
  Administered 2024-11-17: 4 mg via ORAL
  Filled 2024-11-17: qty 1

## 2024-11-17 MED ORDER — CEFTRIAXONE SODIUM 1 G IJ SOLR
1.0000 g | Freq: Once | INTRAMUSCULAR | Status: AC
  Administered 2024-11-17: 1 g via INTRAMUSCULAR
  Filled 2024-11-17: qty 10

## 2024-11-17 MED ORDER — LIDOCAINE HCL (PF) 1 % IJ SOLN
1.0000 mL | Freq: Once | INTRAMUSCULAR | Status: AC
  Administered 2024-11-17: 2 mL
  Filled 2024-11-17: qty 5

## 2024-11-17 MED ORDER — OXYCODONE-ACETAMINOPHEN 5-325 MG PO TABS
1.0000 | ORAL_TABLET | Freq: Once | ORAL | Status: AC
  Administered 2024-11-17: 1 via ORAL
  Filled 2024-11-17: qty 1

## 2024-11-17 NOTE — Discharge Instructions (Signed)
 Contact a health care provider if:  Your symptoms don't get better after 1-2 days of taking antibiotics.  Your symptoms go away and then come back.  You have a fever or chills.  You vomit or feel like you may vomit.    Get help right away if:  You have very bad pain in your back or lower belly.  You faint.

## 2024-11-17 NOTE — ED Provider Notes (Signed)
 " [facility] Provider Note   CSN: [id number] Arrival date & time: 11/17/24  1231     Patient presents with: Abdominal Pain   Erin Wolf is a 69 y.o. female who presents emergency department for evaluation of abdominal pain.  She was seen in the emergency department 2 days ago for the same.  She had a thorough workup including a noncontrast CT scan of her abdomen which showed no acute abnormalities.  Patient reports that she continues to have stomach cramping and she notes that this morning she had blood in her urine and complains that she has been having increased dysuria.  She denies flank pain nausea or vomiting. This is the patients 11th visit in the past 6 mos     Abdominal Pain      Prior to Admission medications  Medication Sig Start Date End Date Taking? Authorizing Provider  cephALEXin  (KEFLEX ) 500 MG capsule Take 1 capsule (500 mg total) by mouth 4 (four) times daily. 11/17/24  Yes Thuan Tippett, PA-C  acetaminophen  (TYLENOL ) 500 MG tablet Take 1,000 mg by mouth every 6 (six) hours as needed for headache, fever or mild pain (pain score 1-3).    [provider]  amoxicillin -clavulanate (AUGMENTIN ) 875-125 MG tablet Take 1 tablet by mouth every 12 (twelve) hours. Patient not taking: Reported on 11/12/2024 09/12/24   Teresa Almarie LABOR, PA-C  amoxicillin -clavulanate (AUGMENTIN ) 875-125 MG tablet Take 1 tablet by mouth every 12 (twelve) hours. 10/19/24   Myriam Fonda RAMAN, PA-C  atorvastatin  (LIPITOR) 20 MG tablet Take 20 mg by mouth daily. 11/12/24   [provider]  baclofen  (LIORESAL ) 10 MG tablet Take 1 tablet (10 mg total) by mouth 3 (three) times daily. 11/12/24   Andra Corean BROCKS, PA-C  Blood Glucose Monitoring Suppl (TRUE METRIX AIR GLUCOSE METER) w/Device KIT  09/20/24   [provider]  cyclobenzaprine  (FLEXERIL ) 5 MG tablet Take 1 tablet (5 mg total) by mouth 3 (three) times daily as needed.  11/13/24   Andra Corean BROCKS, PA-C  diazepam  (VALIUM ) 5 MG tablet SMARTSIG:1 Tablet(s) By Mouth Patient not taking: Reported on 11/12/2024 09/23/24   [provider]  dicyclomine  (BENTYL ) 10 MG capsule Take 1 capsule (10 mg total) by mouth 4 (four) times daily -  before meals and at bedtime. Patient not taking: Reported on 11/12/2024 10/16/24   Flint Sonny POUR, PA-C  FLUoxetine  (PROZAC ) 20 MG capsule Take 20 mg by mouth daily.    [provider]  linaclotide  (LINZESS ) 72 MCG capsule Take 72 mcg by mouth daily as needed (constipation). Patient not taking: Reported on 11/12/2024    [provider]  losartan  (COZAAR ) 25 MG tablet Take 1 tablet (25 mg total) by mouth daily. Patient not taking: Reported on 11/12/2024 10/16/24 10/16/25  Sofia, Leslie K, PA-C  melatonin 1 MG TABS tablet Take 1 mg by mouth at bedtime as needed (for sleep). Patient not taking: Reported on 11/12/2024    [provider]  Multiple Vitamins-Minerals (CENTRUM SILVER WOMEN 50+) TABS Take 1 tablet by mouth daily with breakfast.    [provider]  ofloxacin (OCUFLOX) 0.3 % ophthalmic solution Place 1 drop into the right eye 4 (four) times daily. 10/01/24   [provider]  ondansetron  (ZOFRAN -ODT) 4 MG disintegrating tablet Take 1 tablet (4 mg total) by mouth every 8 (eight) hours as needed for nausea or vomiting. Patient not taking: Reported on 11/12/2024 10/16/24   Sofia, Leslie K, PA-C  oxyCODONE  (ROXICODONE ) 5 MG immediate release tablet Take 1 tablet (5 mg total) by mouth every 8 (eight) hours as needed. 10/16/24 10/16/25  Flint Sonny POUR, PA-C  oxyCODONE  10 MG TABS Take 0.5 tablets (5 mg total) by mouth every 4 (four) hours as needed for severe pain (pain score 7-10). 10/19/24   Bundy, Joshua S, PA-C  Oxycodone  HCl 20 MG TABS Take 1 tablet by mouth 4 (four) times daily as needed. Patient not taking: Reported on 11/12/2024 10/22/24   [provider]  OZEMPIC, 1  MG/DOSE, 4 MG/3ML SOPN INJECT 1 MG UNDER THE SKIN ONCE WEEKLY    [provider]  pantoprazole  (PROTONIX ) 20 MG tablet Take 1 tablet (20 mg total) by mouth daily. 09/16/24   Keith, Kayla N, PA-C  prednisoLONE  acetate (PRED FORTE ) 1 % ophthalmic suspension Place 1 drop into the right eye 4 (four) times daily.    [provider]  promethazine  (PHENERGAN ) 12.5 MG tablet Take 12.5 mg by mouth 3 (three) times daily as needed for nausea or vomiting. Patient not taking: Reported on 11/12/2024 07/17/24   [provider]  REFRESH 1.4-0.6 % SOLN Place 1 drop into both eyes 3 (three) times daily as needed (for dryness).    [provider]  sucralfate  (CARAFATE ) 1 g tablet Take 1 tablet (1 g total) by mouth 4 (four) times daily -  with meals and at bedtime. Patient not taking: Reported on 11/12/2024 09/16/24   Francis Ileana SAILOR, PA-C  traMADol (ULTRAM) 50 MG tablet Take 50 mg by mouth every 6 (six) hours as needed (for pain not relieved by Tylenol ). Patient not taking: Reported on 11/12/2024 07/03/24   [provider]  TRUE METRIX BLOOD GLUCOSE TEST test strip SMARTSIG:Via Meter 09/20/24   [provider]  TRUEplus Lancets 33G MISC SMARTSIG:Lancet Topical 09/20/24   [provider]    Allergies: Acetaminophen , Aspirin , Bupropion hcl, Celebrex [celecoxib], Ciprofloxacin , Eszopiclone, Ibuprofen, Ketorolac  tromethamine , Latex, Neurontin [gabapentin], Nsaids, Other, Penicillins, Darvocet [propoxyphene n-acetaminophen ], Tape, Butalbital-apap-caffeine, Cipro  [ciprofloxacin  hcl], Etodolac, Ozempic (0.25 or 0.5 mg-dose) [semaglutide(0.25 or 0.5mg -dos)], Topiramate, and Wound dressing adhesive    Review of Systems  Gastrointestinal:  Positive for abdominal pain.    Updated Vital Signs BP 135/84   Pulse 96   Temp 98 F (36.7 C)   Resp 15   Ht 5' 4 (1.626 m)   Wt 99.8 kg   SpO2 100%   BMI 37.76 kg/m   Physical Exam Vitals and nursing note reviewed.   Constitutional:      General: She is not in acute distress.    Appearance: She is well-developed. She is not diaphoretic.  HENT:     Head: Normocephalic and atraumatic.     Right Ear: External ear normal.     Left Ear: External ear normal.     Nose: Nose normal.     Mouth/Throat:     Mouth: Mucous membranes are moist.  Eyes:     General: No scleral icterus.    Conjunctiva/sclera: Conjunctivae normal.  Cardiovascular:     Rate and Rhythm: Normal rate and regular rhythm.     Heart sounds: Normal heart sounds. No murmur heard.    No friction rub. No gallop.  Pulmonary:     Effort: Pulmonary effort is normal. No respiratory distress.     Breath sounds: Normal breath sounds.  Abdominal:     General: Bowel sounds are normal. There is no distension.     Palpations: Abdomen is soft. There  is no mass or pulsatile mass.     Tenderness: There is abdominal tenderness in the suprapubic area. There is no guarding.  Musculoskeletal:     Cervical back: Normal range of motion.  Skin:    General: Skin is warm and dry.  Neurological:     Mental Status: She is alert and oriented to person, place, and time.  Psychiatric:        Behavior: Behavior normal.     (all labs ordered are listed, but only abnormal results are displayed) Labs Reviewed  URINALYSIS, ROUTINE W REFLEX MICROSCOPIC - Abnormal; Notable for the following components:      Result Value   APPearance CLOUDY (*)    Hgb urine dipstick LARGE (*)    Protein, ur 30 (*)    All other components within normal limits  CBC WITH DIFFERENTIAL/PLATELET - Abnormal; Notable for the following components:   RBC 5.44 (*)    All other components within normal limits  URINALYSIS, MICROSCOPIC (REFLEX) - Abnormal; Notable for the following components:   Bacteria, UA FEW (*)    All other components within normal limits  URINE CULTURE  LIPASE, BLOOD  COMPREHENSIVE METABOLIC PANEL WITH GFR    EKG: None  Radiology: CT ABDOMEN PELVIS WO  CONTRAST Result Date: 11/15/2024 EXAM: CT ABDOMEN AND PELVIS WITHOUT CONTRAST 11/15/2024 11:20:19 PM TECHNIQUE: CT of the abdomen and pelvis was performed without the administration of intravenous contrast. Multiplanar reformatted images are provided for review. Automated exposure control, iterative reconstruction, and/or weight-based adjustment of the mA/kV was utilized to reduce the radiation dose to as low as reasonably achievable. COMPARISON: None available. CLINICAL HISTORY: LLQ abdominal pain. Left lower quadrant abdominal pain. FINDINGS: LOWER CHEST: No acute abnormality. LIVER: The liver is unremarkable. GALLBLADDER AND BILE DUCTS: Gallbladder surgically removed. Common bile duct dilatation as well as central intrahepatic biliary ductal dilatation. These findings can be seen in the setting of cholecystectomy. SPLEEN: No acute abnormality. PANCREAS: No acute abnormality. ADRENAL GLANDS: No acute abnormality. KIDNEYS, URETERS AND BLADDER: No stones in the kidneys or ureters. No hydronephrosis. No perinephric or periureteral stranding. Urinary bladder is unremarkable. GI AND BOWEL: Stomach demonstrates no acute abnormality. No small or large bowel thickening or dilatation. The appendix is not definitely identified with no inflammatory changes in the right lower quadrant to suggest acute appendicitis. Colonic diverticulosis. There is no bowel obstruction. PERITONEUM AND RETROPERITONEUM: No ascites. No free air. VASCULATURE: Aorta is normal in caliber. LYMPH NODES: No lymphadenopathy. REPRODUCTIVE ORGANS: Status post hysterectomy. No adnexal mass. BONES AND SOFT TISSUES: Total left hip arthroplasty. No acute osseous abnormality. No focal soft tissue abnormality. IMPRESSION: 1. No acute CT abnormality in the abdomen or pelvis with limited evaluation of this non-contrast study. 2. Colonic diverticulosis with no acute diverticulitis. Electronically signed by: Morgane Naveau MD 11/15/2024 11:29 PM EST RP  Workstation: [id number]     Procedures   Medications Ordered in the ED  cefTRIAXone  (ROCEPHIN ) injection 1 g (1 g Intramuscular Given 11/17/24 1537)  lidocaine  (PF) (XYLOCAINE ) 1 % injection 1-2.1 mL (2 mLs Other Given 11/17/24 1537)  oxyCODONE -acetaminophen  (PERCOCET/ROXICET) 5-325 MG per tablet 1 tablet (1 tablet Oral Given 11/17/24 1536)  ondansetron  (ZOFRAN -ODT) disintegrating tablet 4 mg (4 mg Oral Given 11/17/24 1527)    Clinical Course as of 11/17/24 1635  Wed Nov 17, 2024  1553 Urinalysis, Routine w reflex microscopic -Urine, Clean Catch(!) [AH]  1553 WBC: 4.9 [AH]    Clinical Course User Index [AH] Arloa Chroman, PA-C  Medical Decision Making Amount and/or Complexity of Data Reviewed Labs: ordered. Decision-making details documented in ED Course.  Risk Prescription drug management.   This is a 69 year old female who presents for abdominal pain. The differential diagnosis for generalized abdominal pain includes, but is not limited to AAA, gastroenteritis, appendicitis, Bowel obstruction, Bowel perforation. Gastroparesis, DKA, Hernia, Inflammatory bowel disease, mesenteric ischemia, pancreatitis, peritonitis SBP, volvulus.   Labs are reassuring with lipase of 22, normal CMP, CBC without elevated white blood cell count. Patient's urine does appear to be infected with gross hematuria per patient  PDMP reviewed during this encounter. Patient is under [facility].  She is receiving significant amount of narcotics and got 30-day supply of 20 mg oxycodone  on January 2. Patient reports that she has been taking more than she is prescribed because nobody understands how bad my pain is.  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  She has been taking more pain medication because of her joint pain and her belly pain. A long discussion with the patient about her obligation to take narcotic  medication as prescribed only and that if she needs other pain control she may take Tylenol  or ibuprofen as well as supportive care techniques like hot pack showers rest.  She may follow-up with her prescribing physician for pain control needs. Patient will be discharged today with Keflex  for treatment of her urinary tract infection which was sent for culture. She does not have any evidence of acute pyelonephritis.  Her workup from 2 days ago was very reassuring with a normal CT scan.  She appears appropriate for discharge at this time with strict return precautions.      Final diagnoses:  Acute cystitis with hematuria    ED Discharge Orders          Ordered    cephALEXin  (KEFLEX ) 500 MG capsule  4 times daily        11/17/24 1622               Arloa Chroman, PA-C 11/17/24 1637    Rogelia Jerilynn RAMAN, MD 11/19/24 1428  "

## 2024-11-17 NOTE — ED Notes (Signed)
 Reviewed discharge instructions,follow up and medication. Advised of medication to be picked up at pharmacy. Pt states understanding. Ambulatory at discharge

## 2024-11-17 NOTE — ED Triage Notes (Signed)
 Pt c/o continued abd pain; sts now it is in RLQ; she also reports hematuria this morning; workup 2 days ago for same

## 2024-11-18 LAB — URINE CULTURE
